# Patient Record
Sex: Female | Born: 1945 | Race: White | Hispanic: No | Marital: Married | Smoking: Never smoker
Health system: Southern US, Community
[De-identification: ages and names within clinical notes are randomized; demographics above are authoritative.]

## PROBLEM LIST (undated history)

## (undated) DIAGNOSIS — F329 Major depressive disorder, single episode, unspecified: Secondary | ICD-10-CM

## (undated) DIAGNOSIS — R339 Retention of urine, unspecified: Secondary | ICD-10-CM

## (undated) DIAGNOSIS — S065XAA Traumatic subdural hemorrhage with loss of consciousness status unknown, initial encounter: Secondary | ICD-10-CM

## (undated) DIAGNOSIS — E669 Obesity, unspecified: Secondary | ICD-10-CM

## (undated) DIAGNOSIS — E041 Nontoxic single thyroid nodule: Secondary | ICD-10-CM

## (undated) DIAGNOSIS — K449 Diaphragmatic hernia without obstruction or gangrene: Secondary | ICD-10-CM

## (undated) DIAGNOSIS — I5042 Chronic combined systolic (congestive) and diastolic (congestive) heart failure: Secondary | ICD-10-CM

## (undated) DIAGNOSIS — S065X9A Traumatic subdural hemorrhage with loss of consciousness of unspecified duration, initial encounter: Secondary | ICD-10-CM

## (undated) DIAGNOSIS — K222 Esophageal obstruction: Secondary | ICD-10-CM

## (undated) DIAGNOSIS — B9689 Other specified bacterial agents as the cause of diseases classified elsewhere: Secondary | ICD-10-CM

## (undated) DIAGNOSIS — I700 Atherosclerosis of aorta: Secondary | ICD-10-CM

## (undated) DIAGNOSIS — J189 Pneumonia, unspecified organism: Secondary | ICD-10-CM

## (undated) DIAGNOSIS — IMO0001 Reserved for inherently not codable concepts without codable children: Secondary | ICD-10-CM

## (undated) DIAGNOSIS — E7800 Pure hypercholesterolemia, unspecified: Secondary | ICD-10-CM

## (undated) DIAGNOSIS — F32A Depression, unspecified: Secondary | ICD-10-CM

## (undated) DIAGNOSIS — M1990 Unspecified osteoarthritis, unspecified site: Secondary | ICD-10-CM

## (undated) DIAGNOSIS — K219 Gastro-esophageal reflux disease without esophagitis: Secondary | ICD-10-CM

## (undated) DIAGNOSIS — I2510 Atherosclerotic heart disease of native coronary artery without angina pectoris: Secondary | ICD-10-CM

## (undated) DIAGNOSIS — W19XXXA Unspecified fall, initial encounter: Secondary | ICD-10-CM

## (undated) DIAGNOSIS — I10 Essential (primary) hypertension: Secondary | ICD-10-CM

## (undated) DIAGNOSIS — N390 Urinary tract infection, site not specified: Secondary | ICD-10-CM

## (undated) DIAGNOSIS — N289 Disorder of kidney and ureter, unspecified: Secondary | ICD-10-CM

## (undated) HISTORY — PX: CHOLECYSTECTOMY: SHX55

---

## 2000-03-12 ENCOUNTER — Ambulatory Visit (HOSPITAL_BASED_OUTPATIENT_CLINIC_OR_DEPARTMENT_OTHER): Admission: RE | Admit: 2000-03-12 | Discharge: 2000-03-12 | Payer: Self-pay | Admitting: Orthopedic Surgery

## 2002-03-08 ENCOUNTER — Emergency Department (HOSPITAL_COMMUNITY): Admission: EM | Admit: 2002-03-08 | Discharge: 2002-03-08 | Payer: Self-pay | Admitting: Emergency Medicine

## 2002-03-08 ENCOUNTER — Encounter: Payer: Self-pay | Admitting: Emergency Medicine

## 2008-03-06 ENCOUNTER — Inpatient Hospital Stay (HOSPITAL_COMMUNITY): Admission: AD | Admit: 2008-03-06 | Discharge: 2008-03-10 | Payer: Self-pay | Admitting: Internal Medicine

## 2008-03-09 ENCOUNTER — Ambulatory Visit: Payer: Self-pay | Admitting: Oncology

## 2009-09-15 ENCOUNTER — Emergency Department (HOSPITAL_COMMUNITY): Admission: EM | Admit: 2009-09-15 | Discharge: 2009-09-15 | Payer: Self-pay | Admitting: Emergency Medicine

## 2009-12-11 ENCOUNTER — Inpatient Hospital Stay (HOSPITAL_COMMUNITY)
Admission: EM | Admit: 2009-12-11 | Discharge: 2009-12-15 | Payer: Self-pay | Source: Home / Self Care | Admitting: Emergency Medicine

## 2009-12-12 LAB — CONVERTED CEMR LAB
Basophils Absolute: 0 10*3/uL
Basophils Relative: 1 %
Eosinophils Absolute: 0.3 10*3/uL
Eosinophils Relative: 5 %
Hgb A1c MFr Bld: 7.4 %
Lymphocytes Relative: 40 %
MCV: 92.9 fL
Monocytes Absolute: 0.4 10*3/uL
Neutro Abs: 2.9 10*3/uL
Platelets: 171 10*3/uL
RBC: 3.21 M/uL

## 2009-12-13 LAB — CONVERTED CEMR LAB
CO2: 23 meq/L
Calcium: 8.8 mg/dL
Chloride: 111 meq/L
Sodium: 140 meq/L
Total Bilirubin: 0.5 mg/dL
Total Protein: 2.6 g/dL

## 2009-12-26 ENCOUNTER — Ambulatory Visit: Payer: Self-pay | Admitting: Nurse Practitioner

## 2009-12-26 DIAGNOSIS — N390 Urinary tract infection, site not specified: Secondary | ICD-10-CM | POA: Insufficient documentation

## 2009-12-26 DIAGNOSIS — K449 Diaphragmatic hernia without obstruction or gangrene: Secondary | ICD-10-CM | POA: Insufficient documentation

## 2009-12-26 DIAGNOSIS — I10 Essential (primary) hypertension: Secondary | ICD-10-CM | POA: Insufficient documentation

## 2009-12-26 DIAGNOSIS — Z9889 Other specified postprocedural states: Secondary | ICD-10-CM | POA: Insufficient documentation

## 2009-12-26 DIAGNOSIS — F329 Major depressive disorder, single episode, unspecified: Secondary | ICD-10-CM

## 2009-12-26 DIAGNOSIS — K219 Gastro-esophageal reflux disease without esophagitis: Secondary | ICD-10-CM

## 2009-12-26 DIAGNOSIS — M129 Arthropathy, unspecified: Secondary | ICD-10-CM | POA: Insufficient documentation

## 2009-12-26 DIAGNOSIS — E119 Type 2 diabetes mellitus without complications: Secondary | ICD-10-CM

## 2009-12-26 DIAGNOSIS — D649 Anemia, unspecified: Secondary | ICD-10-CM | POA: Insufficient documentation

## 2009-12-26 DIAGNOSIS — N181 Chronic kidney disease, stage 1: Secondary | ICD-10-CM | POA: Insufficient documentation

## 2009-12-26 DIAGNOSIS — E785 Hyperlipidemia, unspecified: Secondary | ICD-10-CM | POA: Insufficient documentation

## 2009-12-26 LAB — CONVERTED CEMR LAB
Blood in Urine, dipstick: NEGATIVE
Cholesterol, target level: 200 mg/dL
HDL goal, serum: 40 mg/dL
Ketones, urine, test strip: NEGATIVE
LDL Goal: 100 mg/dL
Urobilinogen, UA: 1

## 2009-12-27 ENCOUNTER — Encounter (INDEPENDENT_AMBULATORY_CARE_PROVIDER_SITE_OTHER): Payer: Self-pay | Admitting: Nurse Practitioner

## 2009-12-31 ENCOUNTER — Encounter (INDEPENDENT_AMBULATORY_CARE_PROVIDER_SITE_OTHER): Payer: Self-pay | Admitting: Nurse Practitioner

## 2010-01-16 ENCOUNTER — Encounter (INDEPENDENT_AMBULATORY_CARE_PROVIDER_SITE_OTHER): Payer: Self-pay | Admitting: Nurse Practitioner

## 2010-01-28 ENCOUNTER — Ambulatory Visit: Payer: Self-pay | Admitting: Nurse Practitioner

## 2010-01-28 LAB — CONVERTED CEMR LAB
Bilirubin Urine: NEGATIVE
Glucose, Urine, Semiquant: NEGATIVE
Ketones, urine, test strip: NEGATIVE
Nitrite: NEGATIVE
Urobilinogen, UA: 0.2

## 2010-01-29 LAB — CONVERTED CEMR LAB
ALT: <8 units/L (ref 0–35)
AST: 14 units/L (ref 0–37)
Albumin: 4.2 g/dL (ref 3.5–5.2)
BUN: 22 mg/dL (ref 6–23)
Chloride: 100 meq/L (ref 96–112)
Creatinine, Ser: 0.97 mg/dL (ref 0.40–1.20)
Glucose, Bld: 150 mg/dL — ABNORMAL HIGH (ref 70–99)
Total Bilirubin: 0.6 mg/dL (ref 0.3–1.2)
Total Protein: 7.5 g/dL (ref 6.0–8.3)

## 2010-03-11 ENCOUNTER — Ambulatory Visit: Payer: Self-pay | Admitting: Nurse Practitioner

## 2010-03-11 DIAGNOSIS — E669 Obesity, unspecified: Secondary | ICD-10-CM

## 2010-03-11 LAB — CONVERTED CEMR LAB
Bilirubin Urine: NEGATIVE
Blood Glucose, Fingerstick: 181
Ketones, urine, test strip: NEGATIVE
Specific Gravity, Urine: 1.01
pH: 6.5

## 2010-03-12 ENCOUNTER — Encounter (INDEPENDENT_AMBULATORY_CARE_PROVIDER_SITE_OTHER): Payer: Self-pay | Admitting: Nurse Practitioner

## 2010-03-25 ENCOUNTER — Ambulatory Visit: Payer: Self-pay | Admitting: Internal Medicine

## 2010-03-25 ENCOUNTER — Encounter (INDEPENDENT_AMBULATORY_CARE_PROVIDER_SITE_OTHER): Payer: Self-pay | Admitting: Nurse Practitioner

## 2010-03-25 LAB — CONVERTED CEMR LAB
Albumin: 4.1 g/dL (ref 3.5–5.2)
Alkaline Phosphatase: 88 units/L (ref 39–117)
BUN: 13 mg/dL (ref 6–23)
Calcium: 10.1 mg/dL (ref 8.4–10.5)
Chloride: 103 meq/L (ref 96–112)
HDL: 39 mg/dL — ABNORMAL LOW (ref >39)
Potassium: 4.8 meq/L (ref 3.5–5.3)
Total Bilirubin: 0.6 mg/dL (ref 0.3–1.2)
Total CHOL/HDL Ratio: 4.8
Triglycerides: 159 mg/dL — ABNORMAL HIGH (ref <150)

## 2010-03-26 ENCOUNTER — Ambulatory Visit: Payer: Self-pay | Admitting: Nurse Practitioner

## 2010-04-23 ENCOUNTER — Encounter (INDEPENDENT_AMBULATORY_CARE_PROVIDER_SITE_OTHER): Payer: Self-pay | Admitting: Nurse Practitioner

## 2010-05-14 ENCOUNTER — Ambulatory Visit: Payer: Self-pay | Admitting: Nurse Practitioner

## 2010-05-14 ENCOUNTER — Encounter (INDEPENDENT_AMBULATORY_CARE_PROVIDER_SITE_OTHER): Payer: Self-pay | Admitting: Nurse Practitioner

## 2010-05-14 ENCOUNTER — Other Ambulatory Visit
Admission: RE | Admit: 2010-05-14 | Discharge: 2010-05-14 | Payer: Self-pay | Source: Home / Self Care | Admitting: Nurse Practitioner

## 2010-05-14 LAB — CONVERTED CEMR LAB
Blood Glucose, Fingerstick: 202
Glucose, Urine, Semiquant: NEGATIVE
Nitrite: NEGATIVE
Protein, U semiquant: NEGATIVE
Specific Gravity, Urine: 1.015
Urobilinogen, UA: 0.2

## 2010-05-16 ENCOUNTER — Encounter (INDEPENDENT_AMBULATORY_CARE_PROVIDER_SITE_OTHER): Payer: Self-pay | Admitting: Nurse Practitioner

## 2010-05-16 LAB — CONVERTED CEMR LAB: Microalb, Ur: 1.77 mg/dL (ref 0.00–1.89)

## 2010-05-22 ENCOUNTER — Encounter (INDEPENDENT_AMBULATORY_CARE_PROVIDER_SITE_OTHER): Payer: Self-pay | Admitting: Nurse Practitioner

## 2010-05-22 ENCOUNTER — Ambulatory Visit (HOSPITAL_COMMUNITY)
Admission: RE | Admit: 2010-05-22 | Discharge: 2010-05-22 | Payer: Self-pay | Source: Home / Self Care | Attending: Internal Medicine | Admitting: Internal Medicine

## 2010-06-17 NOTE — Assessment & Plan Note (Signed)
Summary: NEW - Hospital F/U   Vital Signs:  Patient profile:   65 year old female Menstrual status:  hysterectomy Height:      62 inches Weight:      204 pounds BMI:     37.45 Temp:     97.9 degrees F Pulse rate:   76 / minute Pulse rhythm:   regular Resp:     18 per minute BP sitting:   130 / 72  (left arm) Cuff size:   large  Vitals Entered By: Vesta Mixer CMA (December 26, 2009 2:12 PM)  Nutrition Counseling: Patient's BMI is greater than 25 and therefore counseled on weight management options. CC: One time hosp f/u for abd pain and dehydration.  Plans to transfer to [facility] Hypertension Management, Lipid Management, Abdominal Pain, Depression Is Patient Diabetic? Yes  Does patient need assistance? Ambulation Normal     Menstrual Status hysterectomy   CC:  One time hosp f/u for abd pain and dehydration.  Plans to transfer to [facility]., Hypertension Management, Lipid Management, Abdominal Pain, and Depression.  History of Present Illness:  Pt into into the office to establish care. Pt was previously seen in [place].  She was last seen there about 1 month ago. Dr. Creta Levin. She was noncomplaint at that time and pt admits that she was not able to afford the medications.  Hospitalized from 12/12/2009 to 12/15/2009 Admitted with complaints of frequent falls and nausea  Pt presents today with all her medications. She has been taking as ordered since her hospital stay.  Diabetes Management History:      The patient is a 65 years old female who comes in for evaluation of DM Type 2.  She has not been enrolled in the "Diabetic Education Program".  She states understanding of dietary principles and is following her diet appropriately.  No sensory loss is reported.  Self foot exams are not being performed.  She is checking home blood sugars.  She says that she is not exercising regularly.        No hyperglycemic symptoms are reported.  Other comments include: pt has been  taking medications since her hospital f/u.        No changes have been made to her treatment plan since last visit.    Dyspepsia History:      She has no alarm features of dyspepsia including no history of melena, hematochezia, dysphagia, persistent vomiting, or involuntary weight loss > 5%.  There is a prior history of GERD.  The patient does not have a prior history of documented ulcer disease.  The dominant symptom is heartburn or acid reflux.  An H-2 blocker medication is not currently being taken.  She has no history of a positive H. Pylori serology.  No previous upper endoscopy has been done.    Hypertension History:      She denies headache, chest pain, and palpitations.  She notes no problems with any antihypertensive medication side effects.        Positive major cardiovascular risk factors include female age 72 years old or older, diabetes, hyperlipidemia, and hypertension.  Negative major cardiovascular risk factors include non-tobacco-user status.        Further assessment for target organ damage reveals no history of ASHD, cardiac end-organ damage (CHF/LVH), stroke/TIA, peripheral vascular disease, or hypertensive retinopathy.    Lipid Management History:      Positive NCEP/ATP III risk factors include female age 44 years old or older, diabetes, and hypertension.  Negative NCEP/ATP III risk factors include no history of early menopause without estrogen hormone replacement, non-tobacco-user status, no ASHD (atherosclerotic heart disease), no prior stroke/TIA, no peripheral vascular disease, and no history of aortic aneurysm.        The patient states that she does not know about the "Therapeutic Lifestyle Change" diet.  The patient does not know about adjunctive measures for cholesterol lowering.  She expresses no side effects from her lipid-lowering medication.  The patient denies any symptoms to suggest myopathy or liver disease.    Diabetic Foot Exam Foot Inspection Is there a history  of a foot ulcer?              No Is there a foot ulcer now?              No Can the patient see the bottom of their feet?          No Are the shoes appropriate in style and fit?          Yes Is there swelling or an abnormal foot shape?          No Are the toenails long?                No Are the toenails thick?                No Are the toenails ingrown?              No Is there heavy callous build-up?              No Is there pain in the calf muscle (Intermittent claudication) when walking?    NoIs there a claw toe deformity?              No Is there elevated skin temperature?            No Is there limited ankle dorsiflexion?            No Is there foot or ankle muscle weakness?            No  Diabetic Foot Care Education Pulse Check          Right Foot          Left Foot Dorsalis Pedis:        normal            normal       Habits & Providers  Alcohol-Tobacco-Diet     Alcohol drinks/day: 0     Tobacco Status: never  Exercise-Depression-Behavior     Does Patient Exercise: no     Drug Use: never  Current Medications (verified): 1)  Citalopram Hydrobromide 40 Mg Tabs (Citalopram Hydrobromide) .Marland Kitchen.. 1 By Mouth Once Daily 2)  Sucralfate 1 Gm Tabs (Sucralfate) .Marland Kitchen.. 1 By Mouth Three Times A Day Before Meals 3)  Diltiazem Hcl 90 Mg Tabs (Diltiazem Hcl) .Marland Kitchen.. 1 By Mouth Once Daily 4)  Glipizide 5 Mg Tabs (Glipizide) .Marland Kitchen.. 1 By Mouth Two Times A Day 5)  Pantoprazole Sodium 40 Mg Tbec (Pantoprazole Sodium) .Marland Kitchen.. 1 By Mouth Once Daily 6)  Carvedilol 12.5 Mg Tabs (Carvedilol) .Marland Kitchen.. 1 By Mouth Two Times A Day 7)  Lovastatin 20 Mg Tabs (Lovastatin) .Marland Kitchen.. 1 By Mouth Once Daily 8)  Lisinopril 20 Mg Tabs (Lisinopril) .Marland Kitchen.. 1 By Mouth Once Daily  Allergies (verified): 1)  ! Norvasc  Past History:  Past Surgical History: Cholecystectomy right knee arthroscopy tubal ligation  Family History: mother -  diabetes, htn (deceased) father - deceased (lung cancer), CHF, CAD Sister -  diabetes sister - diabetes  Social History: 1 natural child, 1 adopted child tobacco - none ETOH -  none Drug - noneSmoking Status:  never Drug Use:  never Does Patient Exercise:  no  Review of Systems General:  Denies fever. CV:  Denies chest pain or discomfort; some sweling in feet after hospital d/c but that has resolved. Resp:  Denies cough. GI:  Denies abdominal pain, nausea, and vomiting.  Physical Exam  General:  alert.  obese Head:  normocephalic.   Lungs:  normal breath sounds.   Heart:  normal rate and regular rhythm.   Msk:  kyphosis Extremities:  no edema Neurologic:  alert & oriented X3.   Skin:  color normal.   Psych:  Oriented X3.    Diabetes Management Exam:       Nails:          Left foot: normal          Right foot: normal   Impression & Recommendations:  Problem # 1:  DIABETES MELLITUS, TYPE II (ICD-250.00) Some diabetic teaching done with pt today she will need continued education will order glucometer and continue current meds hgba1c = 7.4 from hospital will need to check previous PCP report to see if she is up to date with immunizations Her updated medication list for this problem includes:    Glipizide 5 Mg Tabs (Glipizide) ..... One tablet by mouth two times a day for blood sugar    Lisinopril 20 Mg Tabs (Lisinopril) ..... One tablet by mouth daily for blood pressure  Orders: Capillary Blood Glucose/CBG (16109) UA Dipstick w/o Micro (manual) (60454)  Problem # 2:  HYPERTENSION, BENIGN ESSENTIAL (ICD-401.1) BP stable Her updated medication list for this problem includes:    Diltiazem Hcl 90 Mg Tabs (Diltiazem hcl) ..... One tablet by mouth daily    Carvedilol 12.5 Mg Tabs (Carvedilol) ..... One tablet by mouth two times a day    Lisinopril 20 Mg Tabs (Lisinopril) ..... One tablet by mouth daily for blood pressure  Problem # 3:  HYPERLIPIDEMIA (ICD-272.4) continue current meds Her updated medication list for this problem includes:     Lovastatin 20 Mg Tabs (Lovastatin) ..... One tablet by mouth nightly for cholesterol  Problem # 4:  GERD (ICD-530.81) symptoms stable continue current meds Her updated medication list for this problem includes:    Sucralfate 1 Gm Tabs (Sucralfate) ..... One tablet by mouth three times a day before meals    Pantoprazole Sodium 40 Mg Tbec (Pantoprazole sodium) ..... One tablet by mouth daily for blood sugar  Complete Medication List: 1)  Citalopram Hydrobromide 40 Mg Tabs (Citalopram hydrobromide) .... One tablet by mouth daily for mood 2)  Sucralfate 1 Gm Tabs (Sucralfate) .... One tablet by mouth three times a day before meals 3)  Diltiazem Hcl 90 Mg Tabs (Diltiazem hcl) .... One tablet by mouth daily 4)  Glipizide 5 Mg Tabs (Glipizide) .... One tablet by mouth two times a day for blood sugar 5)  Pantoprazole Sodium 40 Mg Tbec (Pantoprazole sodium) .... One tablet by mouth daily for blood sugar 6)  Carvedilol 12.5 Mg Tabs (Carvedilol) .... One tablet by mouth two times a day 7)  Lovastatin 20 Mg Tabs (Lovastatin) .... One tablet by mouth nightly for cholesterol 8)  Lisinopril 20 Mg Tabs (Lisinopril) .... One tablet by mouth daily for blood pressure 9)  Blood Glucose Meter Kit (Blood glucose monitoring  suppl) .... Use to check blood sugar daily 10)  Lancets Misc (Lancets) .... Use to check blood sugar daily 11)  Blood Glucose Test Strp (Glucose blood) .... Use to check blood sugar daily before breakfast  Other Orders: T-Culture, Urine ([id number])  Diabetes Management Assessment/Plan:      The following lipid goals have been established for the patient: Total cholesterol goal of 200; LDL cholesterol goal of 100; HDL cholesterol goal of 40; Triglyceride goal of 150.  Her blood pressure goal is < 130/80.    Hypertension Assessment/Plan:      The patient's hypertensive risk group is category C: Target organ damage and/or diabetes.  Today's blood pressure is 130/72.  Her blood pressure goal  is < 130/80.  Lipid Assessment/Plan:      Based on NCEP/ATP III, the patient's risk factor category is "history of diabetes".  The patient's lipid goals are as follows: Total cholesterol goal is 200; LDL cholesterol goal is 100; HDL cholesterol goal is 40; Triglyceride goal is 150.    Patient Instructions: 1)  Sign a release to get records from Dr. Creta Levin at [place]. 2)  Continue your medications as ordered 3)  Follow up in 1 month for diabetes 4)  bring all your medications to this office visit Prescriptions: BLOOD GLUCOSE TEST  STRP (GLUCOSE BLOOD) Use to check blood sugar daily before breakfast  #100 x 3   Entered and Authorized by:   Lehman Prom FNP   Signed by:   Lehman Prom FNP on 12/26/2009   Method used:   Print then Give to Patient   RxID:   [id number] LANCETS  MISC (LANCETS) Use to check blood sugar daily  #100 x 3   Entered and Authorized by:   Lehman Prom FNP   Signed by:   Lehman Prom FNP on 12/26/2009   Method used:   Print then Give to Patient   RxID:   [id number] BLOOD GLUCOSE METER  KIT (BLOOD GLUCOSE MONITORING SUPPL) Use to check blood sugar daily  #1 meter x 0   Entered and Authorized by:   Lehman Prom FNP   Signed by:   Lehman Prom FNP on 12/26/2009   Method used:   Print then Give to Patient   RxID:   [id number] LISINOPRIL 20 MG TABS (LISINOPRIL) One tablet by mouth daily for blood pressure  #30 x 3   Entered and Authorized by:   Lehman Prom FNP   Signed by:   Lehman Prom FNP on 12/26/2009   Method used:   Print then Give to Patient   RxID:   [id number] LOVASTATIN 20 MG TABS (LOVASTATIN) One tablet by mouth nightly for cholesterol  #30 x 3   Entered and Authorized by:   Lehman Prom FNP   Signed by:   Lehman Prom FNP on 12/26/2009   Method used:   Print then Give to Patient   RxID:   [id number] CARVEDILOL 12.5 MG TABS (CARVEDILOL) One tablet by mouth two times a day  #60 x 3    Entered and Authorized by:   Lehman Prom FNP   Signed by:   Lehman Prom FNP on 12/26/2009   Method used:   Print then Give to Patient   RxID:   [id number] PANTOPRAZOLE SODIUM 40 MG TBEC (PANTOPRAZOLE SODIUM) One tablet by mouth daily for blood sugar  #30 x 3   Entered and Authorized by:   Lehman Prom FNP   Signed by:   Lehman Prom FNP on 12/26/2009  Method used:   Print then Give to Patient   RxID:   [id number] GLIPIZIDE 5 MG TABS (GLIPIZIDE) One tablet by mouth two times a day for blood sugar  #60 x 3   Entered and Authorized by:   Lehman Prom FNP   Signed by:   Lehman Prom FNP on 12/26/2009   Method used:   Print then Give to Patient   RxID:   [id number] DILTIAZEM HCL 90 MG TABS (DILTIAZEM HCL) One tablet by mouth daily  #30 x 3   Entered and Authorized by:   Lehman Prom FNP   Signed by:   Lehman Prom FNP on 12/26/2009   Method used:   Print then Give to Patient   RxID:   [id number] SUCRALFATE 1 GM TABS (SUCRALFATE) One tablet by mouth three times a day before meals  #90 x 3   Entered and Authorized by:   Lehman Prom FNP   Signed by:   Lehman Prom FNP on 12/26/2009   Method used:   Print then Give to Patient   RxID:   [id number] CITALOPRAM HYDROBROMIDE 40 MG TABS (CITALOPRAM HYDROBROMIDE) One tablet by mouth daily for mood  #30 x 3   Entered and Authorized by:   Lehman Prom FNP   Signed by:   Lehman Prom FNP on 12/26/2009   Method used:   Print then Give to Patient   RxID:   [id number]   Laboratory Results   Urine Tests  Date/Time Received: December 26, 2009 5:10 PM   Routine Urinalysis   Color: lt. yellow Appearance: Clear Glucose: negative   (Normal Range: Negative) Bilirubin: negative   (Normal Range: Negative) Ketone: negative   (Normal Range: Negative) Spec. Gravity: 1.015   (Normal Range: 1.003-1.035) Blood: negative   (Normal Range: Negative) pH: 7.5   (Normal Range:  5.0-8.0) Protein: trace   (Normal Range: Negative) Urobilinogen: 1.0   (Normal Range: 0-1) Nitrite: negative   (Normal Range: Negative) Leukocyte Esterace: small   (Normal Range: Negative)         CXR  Procedure date:  12/12/2009  Findings:      no active disease  CT Scan  Procedure date:  12/12/2009  Findings:      head - chronic microbvascular ischemia. no acute intracranial abnormality.  there is mucosal thickening of the left maxillary sinus. There is also thickening in the right frontal sinus and the mucosal thickening of the right ethmoid sinus  CT Abdomen/Pelvis  Procedure date:  12/12/2009  Findings:      diffuse scattered sigmoid diverticula.  no acute diverticulitis seen.  There is small hiatal hernia on prior cholecystectomy.   CXR  Procedure date:  12/12/2009  Findings:      no active disease  CT Scan  Procedure date:  12/12/2009  Findings:      head - chronic microbvascular ischemia. no acute intracranial abnormality.  there is mucosal thickening of the left maxillary sinus. There is also thickening in the right frontal sinus and the mucosal thickening of the right ethmoid sinus  CT Abdomen/Pelvis  Procedure date:  12/12/2009  Findings:      diffuse scattered sigmoid diverticula.  no acute diverticulitis seen.  There is small hiatal hernia on prior cholecystectomy.  Laboratory Results   Urine Tests    Routine Urinalysis   Color: lt. yellow Appearance: Clear Glucose: negative   (Normal Range: Negative) Bilirubin: negative   (Normal Range: Negative) Ketone: negative   (Normal Range: Negative) Spec. Gravity: 1.015   (  Normal Range: 1.003-1.035) Blood: negative   (Normal Range: Negative) pH: 7.5   (Normal Range: 5.0-8.0) Protein: trace   (Normal Range: Negative) Urobilinogen: 1.0   (Normal Range: 0-1) Nitrite: negative   (Normal Range: Negative) Leukocyte Esterace: small   (Normal Range: Negative)

## 2010-06-17 NOTE — Letter (Signed)
Summary: Lipid Letter  [facility]  [address] [place], [place] [address]   Phone: [phone]  Fax: [phone]    03/26/2010  Rebacca [place] [address] [place], [place]  [address]  Dear Ms. Sforza:  We have carefully reviewed your last lipid profile from 03/25/2010 and the results are noted below with a summary of recommendations for lipid management.    Cholesterol:       189     Goal: less than 200   HDL "good" Cholesterol:   39     Goal: greater than 40   LDL "bad" Cholesterol:   118     Goal: less than 130   Triglycerides:       159     Goal: less than 150    Labs done during recent office visit are ok.  Except Triglycerides are slightly elevated at 159.    Current Medications: 1)    Citalopram Hydrobromide 40 Mg Tabs (Citalopram hydrobromide) .... One tablet by mouth daily for mood 2)    Sucralfate 1 Gm Tabs (Sucralfate) .... One tablet by mouth three times a day before meals 3)    Diltiazem Hcl 90 Mg Tabs (Diltiazem hcl) .... One tablet by mouth daily 4)    Glipizide 5 Mg Tabs (Glipizide) .... One tablet by mouth two times a day for blood sugar 5)    Pantoprazole Sodium 40 Mg Tbec (Pantoprazole sodium) .... One tablet by mouth daily for blood sugar 6)    Carvedilol 12.5 Mg Tabs (Carvedilol) .... One tablet by mouth two times a day 7)    Pravastatin Sodium 20 Mg Tabs (Pravastatin sodium) .... One tablet by mouth nightly for cholesterol 8)    Lisinopril 40 Mg Tabs (Lisinopril) .... One tablet by mouth daily for blood pressure **note change in dose** 9)    Blood Glucose Meter  Kit (Blood glucose monitoring suppl) .... Use to check blood sugar daily 10)    Lancets  Misc (Lancets) .... Use to check blood sugar daily 11)    Blood Glucose Test  Strp (Glucose blood) .... Use to check blood sugar daily before breakfast 12)    Tramadol Hcl 50 Mg Tabs (Tramadol hcl) .... One tablet by mouth two times a day as needed for pain  If you have any  questions, please call. We appreciate being able to work with you.   Sincerely,    [facility]

## 2010-06-17 NOTE — Letter (Signed)
Summary: PT INFORMATION SHEET  PT INFORMATION SHEET   Imported By: Arta Bruce 12/30/2009 16:32:59  _____________________________________________________________________  External Attachment:    Type:   Image     Comment:   External Document

## 2010-06-17 NOTE — Assessment & Plan Note (Signed)
Summary: Diabetes   Vital Signs:  Patient profile:   65 year old female Menstrual status:  hysterectomy Weight:      210.7 pounds BMI:     38.68 Temp:     97.9 degrees F oral Pulse rate:   80 / minute Pulse rhythm:   regular Resp:     20 per minute BP sitting:   154 / 90  (left arm) Cuff size:   large  Vitals Entered By: Levon Hedger (January 28, 2010 11:29 AM)  Nutrition Counseling: Patient's BMI is greater than 25 and therefore counseled on weight management options. CC: 1 month f/u DM, Hypertension Management, Lipid Management, Abdominal Pain Is Patient Diabetic? Yes Pain Assessment Patient in pain? no       Does patient need assistance? Functional Status Self care Ambulation Normal Comments pt did not bring medication today.   CC:  1 month f/u DM, Hypertension Management, Lipid Management, and Abdominal Pain.  History of Present Illness:  Pt into the office for f/u on diabetes. She was seen on initial visit during last month.  no medications today during visit - advised pt to bring all medications with her  Diabetes Management History:      The patient is a 65 years old female who comes in for evaluation of DM Type 2.  She has not been enrolled in the "Diabetic Education Program".  She states understanding of dietary principles and is following her diet appropriately.  No sensory loss is reported.  Self foot exams are not being performed.  She is checking home blood sugars.  She says that she is not exercising regularly.        Hypoglycemic symptoms are not occurring.  No hyperglycemic symptoms are reported.        No changes have been made to her treatment plan since last visit.    Dyspepsia History:      She has no alarm features of dyspepsia including no history of melena, hematochezia, dysphagia, persistent vomiting, or involuntary weight loss > 5%.  There is a prior history of GERD.  The patient does not have a prior history of documented ulcer disease.   The dominant symptom is heartburn or acid reflux.  An H-2 blocker medication is not currently being taken.  No previous upper endoscopy has been done.    Hypertension History:      She denies headache, chest pain, and palpitations.  She notes no problems with any antihypertensive medication side effects.  Pt has taken her bp meds today.        Positive major cardiovascular risk factors include female age 25 years old or older, diabetes, hyperlipidemia, and hypertension.  Negative major cardiovascular risk factors include non-tobacco-user status.        Further assessment for target organ damage reveals no history of ASHD, cardiac end-organ damage (CHF/LVH), stroke/TIA, peripheral vascular disease, or hypertensive retinopathy.    Lipid Management History:      Positive NCEP/ATP III risk factors include female age 27 years old or older, diabetes, and hypertension.  Negative NCEP/ATP III risk factors include no history of early menopause without estrogen hormone replacement, non-tobacco-user status, no ASHD (atherosclerotic heart disease), no prior stroke/TIA, no peripheral vascular disease, and no history of aortic aneurysm.        The patient states that she does not know about the "Therapeutic Lifestyle Change" diet.  The patient expresses understanding of adjunctive measures for cholesterol lowering.  Adjunctive measures started by the patient include  weight reduction.       Allergies (verified): 1)  ! Norvasc  Review of Systems CV:  Denies chest pain or discomfort. Resp:  Denies cough. GI:  Complains of indigestion.  Physical Exam  General:  alert.  obese Head:  normocephalic.   Lungs:  normal breath sounds.   Heart:  normal rate and regular rhythm.   Abdomen:  normal bowel sounds.   Msk:  up to the exam table Neurologic:  alert & oriented X3.   Skin:  color normal.   Psych:  Oriented X3.    Diabetes Management Exam:    Foot Exam (with socks and/or shoes not present):        Sensory-Monofilament:          Left foot: normal          Right foot: normal       Nails:          Left foot: normal          Right foot: normal   Impression & Recommendations:  Problem # 1:  DIABETES MELLITUS, TYPE II (ICD-250.00)  Her updated medication list for this problem includes:    Glipizide 5 Mg Tabs (Glipizide) ..... One tablet by mouth two times a day for blood sugar    Lisinopril 20 Mg Tabs (Lisinopril) ..... One tablet by mouth daily for blood pressure  Orders: T-Comprehensive Metabolic Panel (64403-47425)  Problem # 2:  HYPERTENSION, BENIGN ESSENTIAL (ICD-401.1) BP elevated today.  if still elevated on next visit will need to adjust meds Her updated medication list for this problem includes:    Diltiazem Hcl 90 Mg Tabs (Diltiazem hcl) ..... One tablet by mouth daily    Carvedilol 12.5 Mg Tabs (Carvedilol) ..... One tablet by mouth two times a day    Lisinopril 20 Mg Tabs (Lisinopril) ..... One tablet by mouth daily for blood pressure  Orders: T-Comprehensive Metabolic Panel ([id number])  Problem # 3:  CHRONIC KIDNEY DISEASE STAGE I (ICD-585.1) will check cbc today  Problem # 4:  HYPERLIPIDEMIA (ICD-272.4)  Her updated medication list for this problem includes:    Lovastatin 20 Mg Tabs (Lovastatin) ..... One tablet by mouth nightly for cholesterol  Orders: T-Comprehensive Metabolic Panel ([id number])  Complete Medication List: 1)  Citalopram Hydrobromide 40 Mg Tabs (Citalopram hydrobromide) .... One tablet by mouth daily for mood 2)  Sucralfate 1 Gm Tabs (Sucralfate) .... One tablet by mouth three times a day before meals 3)  Diltiazem Hcl 90 Mg Tabs (Diltiazem hcl) .... One tablet by mouth daily 4)  Glipizide 5 Mg Tabs (Glipizide) .... One tablet by mouth two times a day for blood sugar 5)  Pantoprazole Sodium 40 Mg Tbec (Pantoprazole sodium) .... One tablet by mouth daily for blood sugar 6)  Carvedilol 12.5 Mg Tabs (Carvedilol) .... One tablet by mouth  two times a day 7)  Lovastatin 20 Mg Tabs (Lovastatin) .... One tablet by mouth nightly for cholesterol 8)  Lisinopril 20 Mg Tabs (Lisinopril) .... One tablet by mouth daily for blood pressure 9)  Blood Glucose Meter Kit (Blood glucose monitoring suppl) .... Use to check blood sugar daily 10)  Lancets Misc (Lancets) .... Use to check blood sugar daily 11)  Blood Glucose Test Strp (Glucose blood) .... Use to check blood sugar daily before breakfast 12)  Tramadol Hcl 50 Mg Tabs (Tramadol hcl) .... One tablet by mouth two times a day as needed for pain  Diabetes Management Assessment/Plan:  The following lipid goals have been established for the patient: Total cholesterol goal of 200; LDL cholesterol goal of 100; HDL cholesterol goal of 40; Triglyceride goal of 150.  Her blood pressure goal is < 130/80.    Hypertension Assessment/Plan:      The patient's hypertensive risk group is category C: Target organ damage and/or diabetes.  Today's blood pressure is 154/90.  Her blood pressure goal is < 130/80.  Lipid Assessment/Plan:      Based on NCEP/ATP III, the patient's risk factor category is "history of diabetes".  The patient's lipid goals are as follows: Total cholesterol goal is 200; LDL cholesterol goal is 100; HDL cholesterol goal is 40; Triglyceride goal is 150.    Patient Instructions: 1)  Blood pressure - slightly elevated today.   2)  We will continue to monitor on your next visit. 3)  Diabetes - continue to take your medications as ordered 4)  Follow up in 6 weeks for diabetes.  You will need lipids, H.Pylori (IgM),  u/a, cbg, flu vaccine. Prescriptions: TRAMADOL HCL 50 MG TABS (TRAMADOL HCL) One tablet by mouth two times a day as needed for pain  #60 x 0   Entered and Authorized by:   Lehman Prom FNP   Signed by:   Lehman Prom FNP on 01/28/2010   Method used:   Print then Give to Patient   RxID:   [id number]   Laboratory Results   Urine Tests  Date/Time  Received: January 28, 2010 11:48 AM   Routine Urinalysis   Color: yellow Appearance: Hazy Glucose: negative   (Normal Range: Negative) Bilirubin: negative   (Normal Range: Negative) Ketone: negative   (Normal Range: Negative) Spec. Gravity: 1.010   (Normal Range: 1.003-1.035) Blood: negative   (Normal Range: Negative) pH: 8.0   (Normal Range: 5.0-8.0) Protein: negative   (Normal Range: Negative) Urobilinogen: 0.2   (Normal Range: 0-1) Nitrite: negative   (Normal Range: Negative) Leukocyte Esterace: small   (Normal Range: Negative)               Diabetic Foot Exam    10-g (5.07) Semmes-Weinstein Monofilament Test Performed by: Levon Hedger          Right Foot          Left Foot Visual Inspection               Test Control      normal         normal Site 1         normal         normal Site 2         normal         normal Site 3         normal         normal Site 4         normal         normal Site 5         normal         normal Site 6         normal         normal Site 7         normal         normal Site 8         normal         normal Site 9         normal  abnormal Site 10         normal         normal  Impression      normal         normal

## 2010-06-17 NOTE — Letter (Signed)
Summary: REQUESTING RECORDS FROM DR.STALLINGS  REQUESTING RECORDS FROM DR.STALLINGS   Imported By: Arta Bruce 01/06/2010 12:55:38  _____________________________________________________________________  External Attachment:    Type:   Image     Comment:   External Document

## 2010-06-17 NOTE — Miscellaneous (Signed)
Summary: Records from Dr. Horald Chestnut  Clinical Lists Changes Records received and placed in historical file

## 2010-06-17 NOTE — Letter (Signed)
Summary: Records received from Dr. Stallings/Historical File Chart  Records received from Dr. Stallings/Historical File Chart   Imported By: Arta Bruce 04/24/2010 17:05:53  _____________________________________________________________________  External Attachment:    Type:   Image     Comment:   External Document

## 2010-06-17 NOTE — Assessment & Plan Note (Signed)
Summary: Diabetes   Vital Signs:  Patient profile:   65 year old female Menstrual status:  hysterectomy Weight:      215.5 pounds BMI:     39.56 Temp:     97.2 degrees F oral Pulse rate:   72 / minute Pulse rhythm:   regular Resp:     20 per minute BP sitting:   160 / 80  (left arm) Cuff size:   large  Vitals Entered By: Levon Hedger (March 11, 2010 9:53 AM)  Nutrition Counseling: Patient's BMI is greater than 25 and therefore counseled on weight management options. CC: follow-up visit DM, Hypertension Management, Lipid Management, Abdominal Pain Is Patient Diabetic? Yes Pain Assessment Patient in pain? yes     Location: legs Intensity: 5 CBG Result 181 CBG Device ID B  Does patient need assistance? Functional Status Self care Ambulation Normal Comments brought a  medication list today.Micheal Likens ate yogurt and drank milk this morning   CC:  follow-up visit DM, Hypertension Management, Lipid Management, and Abdominal Pain.  History of Present Illness:  Pt into the office for 6 week diabetic f/u.    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   Her legs and back start hurting with prolonged standing. Sitting does help some with relief but that is only temporary. At night the legs ache.  She does have to take pain medication. She does use some vics vapor rub which helps some with the aches of the legs.  Obesity - up 5 pounds since last visit.   Pt reports that now she can swallow since she has been on GERD meds. She is eating better. Pt has lost weight before with portion size and exercise.  She admits that she is eating lots of 'junk"  Dyspepsia History:      There is a prior history of GERD.  The patient does not have a prior history of documented ulcer disease.  The dominant symptom is not heartburn or acid reflux.  An H-2 blocker medication is not  currently being taken.    Hypertension History:      She denies headache, chest pain, and palpitations.  She notes no problems with any antihypertensive medication side effects.  Pt has already taken her bp meds today at 8AM. She does check BP when she goes [employer] and notes that systolic is still elevated.        Positive major cardiovascular risk factors include female age 35 years old or older, diabetes, hyperlipidemia, and hypertension.  Negative major cardiovascular risk factors include non-tobacco-user status.        Further assessment for target organ damage reveals no history of ASHD, cardiac end-organ damage (CHF/LVH), stroke/TIA, peripheral vascular disease, or hypertensive retinopathy.    Lipid Management History:      Positive NCEP/ATP III risk factors include female age 70 years old or older, diabetes, and hypertension.  Negative NCEP/ATP III risk factors include no history of early menopause without estrogen hormone replacement, non-tobacco-user status, no ASHD (atherosclerotic heart disease), no prior stroke/TIA, no peripheral vascular disease, and no history of aortic aneurysm.        The patient states that she does not know about the "Therapeutic Lifestyle Change" diet.  The patient does not know about adjunctive measures for cholesterol lowering.  Adjunctive measures started by the patient include ASA.  She expresses no side  effects from her lipid-lowering medication.  Comments include: doing well on meds.  The patient denies any symptoms to suggest myopathy or liver disease.      Allergies (verified): 1)  ! Norvasc  Review of Systems CV:  Denies chest pain or discomfort. Resp:  Denies cough. GI:  Denies abdominal pain, nausea, and vomiting. MS:  Complains of joint pain and stiffness; back and legs.  Physical Exam  General:  alert.   Head:  normocephalic.   Lungs:  normal breath sounds.   Heart:  normal rate and regular rhythm.   Abdomen:  normal bowel sounds.     Neurologic:  alert & oriented X3.   Skin:  color normal.   Psych:  Oriented X3.     Impression & Recommendations:  Problem # 1:  DIABETES MELLITUS, TYPE II (ICD-250.00) will check hgba1c today continue current meds for now Her updated medication list for this problem includes:    Glipizide 5 Mg Tabs (Glipizide) ..... One tablet by mouth two times a day for blood sugar    Lisinopril 40 Mg Tabs (Lisinopril) ..... One tablet by mouth daily for blood pressure **note change in dose**  Orders: Capillary Blood Glucose/CBG (82948) T- Hemoglobin A1C (16109-60454) T-Urine Microalbumin w/creat. ratio [phone]) UA Dipstick w/o Micro (manual) (21308)  Problem # 2:  HYPERTENSION, BENIGN ESSENTIAL (ICD-401.1) will increase lisinopril to 40mg  Her updated medication list for this problem includes:    Diltiazem Hcl 90 Mg Tabs (Diltiazem hcl) ..... One tablet by mouth daily    Carvedilol 12.5 Mg Tabs (Carvedilol) ..... One tablet by mouth two times a day    Lisinopril 40 Mg Tabs (Lisinopril) ..... One tablet by mouth daily for blood pressure **note change in dose**  Problem # 3:  HYPERLIPIDEMIA (ICD-272.4) will check labs on next visit Her updated medication list for this problem includes:    Pravastatin Sodium 20 Mg Tabs (Pravastatin sodium) ..... One tablet by mouth nightly for cholesterol  Problem # 4:  GERD (ICD-530.81) stable with current meds Her updated medication list for this problem includes:    Sucralfate 1 Gm Tabs (Sucralfate) ..... One tablet by mouth three times a day before meals    Pantoprazole Sodium 40 Mg Tbec (Pantoprazole sodium) ..... One tablet by mouth daily for blood sugar  Problem # 5:  OBESITY (ICD-278.00) pt advised to monitor weight and exercise  Problem # 6:  NEED PROPHYLACTIC VACCINATION&INOCULATION FLU (ICD-V04.81) given today  Complete Medication List: 1)  Citalopram Hydrobromide 40 Mg Tabs (Citalopram hydrobromide) .... One tablet by mouth daily  for mood 2)  Sucralfate 1 Gm Tabs (Sucralfate) .... One tablet by mouth three times a day before meals 3)  Diltiazem Hcl 90 Mg Tabs (Diltiazem hcl) .... One tablet by mouth daily 4)  Glipizide 5 Mg Tabs (Glipizide) .... One tablet by mouth two times a day for blood sugar 5)  Pantoprazole Sodium 40 Mg Tbec (Pantoprazole sodium) .... One tablet by mouth daily for blood sugar 6)  Carvedilol 12.5 Mg Tabs (Carvedilol) .... One tablet by mouth two times a day 7)  Pravastatin Sodium 20 Mg Tabs (Pravastatin sodium) .... One tablet by mouth nightly for cholesterol 8)  Lisinopril 40 Mg Tabs (Lisinopril) .... One tablet by mouth daily for blood pressure **note change in dose** 9)  Blood Glucose Meter Kit (Blood glucose monitoring suppl) .... Use to check blood sugar daily 10)  Lancets Misc (Lancets) .... Use to check blood sugar daily 11)  Blood Glucose Test Strp (  Glucose blood) .... Use to check blood sugar daily before breakfast 12)  Tramadol Hcl 50 Mg Tabs (Tramadol hcl) .... One tablet by mouth two times a day as needed for pain  Other Orders: Flu Vaccine 104yrs + (16109) Admin 1st Vaccine (60454)  Hypertension Assessment/Plan:      The patient's hypertensive risk group is category C: Target organ damage and/or diabetes.  Today's blood pressure is 160/80.  Her blood pressure goal is < 130/80.  Lipid Assessment/Plan:      Based on NCEP/ATP III, the patient's risk factor category is "history of diabetes".  The patient's lipid goals are as follows: Total cholesterol goal is 200; LDL cholesterol goal is 100; HDL cholesterol goal is 40; Triglyceride goal is 150.     Patient Instructions: 1)  Schedule a lab visit in 2 weeks for fasting labs and blood pressure check.  Take blood pressure meds with water. Goal <140/90 2)  Do not eat after midnight before this visit. 3)  lipids, cmp, vitamin D, TSH, H.Pylori (IgM) 4)  You have received the flu vaccine today. 5)  Blood presure - still elevated.  6)   Increase lisinopril to 40mg  by mouth daily  7)  Take 2 of the 20mg  tablets that you have.  i will send a new prescription to the pharmacy for next month - lisinopril 40mg : ONE tablet by mouth daily. 8)  Schedule an appointment for a complete physical exam in 3 months.  Will need vaginal exam, mammogram, tdap, EKG, PHQ-9  Prescriptions: LISINOPRIL 40 MG TABS (LISINOPRIL) One tablet by mouth daily for blood pressure **note change in dose**  #30 x 2   Entered and Authorized by:   Lehman Prom FNP   Signed by:   Lehman Prom FNP on 03/11/2010   Method used:   Faxed to ...       [facility] (retail)       [address] [place], [place]  [address]       Ph: [phone]       Fax: [phone]   RxID:   [id number]    Orders Added: 1)  Capillary Blood Glucose/CBG [82948] 2)  Flu Vaccine 71yrs + [90658] 3)  Admin 1st Vaccine [90471] 4)  Est. Patient Level III [27253] 5)  T- Hemoglobin A1C [83036-23375] 6)  T-Urine Microalbumin w/creat. ratio [82043-[address]] 7)  UA Dipstick w/o Micro (manual) [81002]   Immunizations Administered:  Influenza Vaccine # 1:    Vaccine Type: Fluvax 3+    Site: right deltoid    Mfr: [email or web address]    Dose: 0.5 ml    Route: IM    Given by: Levon Hedger    Exp. Date: 11/15/2010    Lot #: [id number]    VIS given: 12/10/09 version given March 11, 2010.  Flu Vaccine Consent Questions:    Do you have a history of severe allergic reactions to this vaccine? no    Any prior history of allergic reactions to egg and/or gelatin? no    Do you have a sensitivity to the preservative Thimersol? no    Do you have a past history of Guillan-Barre Syndrome? no    Do you currently have an acute febrile illness? no    Have you ever had a severe reaction to latex? no    Vaccine information given and explained to patient? yes    Are you currently pregnant? no    ndc  [phone]  Immunizations  Administered:  Influenza Vaccine # 1:    Vaccine Type: Fluvax 3+    Site: right deltoid    Mfr: [email or web address]    Dose: 0.5 ml    Route: IM    Given by: Levon Hedger    Exp. Date: 11/15/2010    Lot #: [id number]    VIS given: 12/10/09 version given March 11, 2010.   Laboratory Results   Urine Tests  Date/Time Received: March 11, 2010 10:52 AM   Routine Urinalysis   Color: red Glucose: negative   (Normal Range: Negative) Bilirubin: negative   (Normal Range: Negative) Ketone: negative   (Normal Range: Negative) Spec. Gravity: 1.010   (Normal Range: 1.003-1.035) Blood: negative   (Normal Range: Negative) pH: 6.5   (Normal Range: 5.0-8.0) Protein: negative   (Normal Range: Negative) Urobilinogen: 0.2   (Normal Range: 0-1) Nitrite: negative   (Normal Range: Negative) Leukocyte Esterace: negative   (Normal Range: Negative)     Blood Tests     CBG Random:: 181mg /dL

## 2010-06-17 NOTE — Assessment & Plan Note (Signed)
Summary: follow-up BP recheck  Nurse Visit   Vital Signs:  Patient profile:   65 year old female Menstrual status:  hysterectomy Pulse rate:   72 / minute Pulse rhythm:   regular Resp:     20 per minute BP sitting:   144 / 76  (right arm) Cuff size:   large  Vitals Entered By: Dutch Quint RN (March 26, 2010 11:08 AM)  Impression & Recommendations:  Problem # 1:  HYPERTENSION, BENIGN ESSENTIAL (ICD-401.1) Blood pressure much better No change in meds right now. Needs CPP in three months  Her updated medication list for this problem includes:    Diltiazem Hcl 90 Mg Tabs (Diltiazem hcl) ..... One tablet by mouth daily    Carvedilol 12.5 Mg Tabs (Carvedilol) ..... One tablet by mouth two times a day    Lisinopril 40 Mg Tabs (Lisinopril) ..... One tablet by mouth daily for blood pressure **note change in dose**  Complete Medication List: 1)  Citalopram Hydrobromide 40 Mg Tabs (Citalopram hydrobromide) .... One tablet by mouth daily for mood 2)  Sucralfate 1 Gm Tabs (Sucralfate) .... One tablet by mouth three times a day before meals 3)  Diltiazem Hcl 90 Mg Tabs (Diltiazem hcl) .... One tablet by mouth daily 4)  Glipizide 5 Mg Tabs (Glipizide) .... One tablet by mouth two times a day for blood sugar 5)  Pantoprazole Sodium 40 Mg Tbec (Pantoprazole sodium) .... One tablet by mouth daily for blood sugar 6)  Carvedilol 12.5 Mg Tabs (Carvedilol) .... One tablet by mouth two times a day 7)  Pravastatin Sodium 20 Mg Tabs (Pravastatin sodium) .... One tablet by mouth nightly for cholesterol 8)  Lisinopril 40 Mg Tabs (Lisinopril) .... One tablet by mouth daily for blood pressure **note change in dose** 9)  Blood Glucose Meter Kit (Blood glucose monitoring suppl) .... Use to check blood sugar daily 10)  Lancets Misc (Lancets) .... Use to check blood sugar daily 11)  Blood Glucose Test Strp (Glucose blood) .... Use to check blood sugar daily before breakfast 12)  Tramadol Hcl 50 Mg Tabs  (Tramadol hcl) .... One tablet by mouth two times a day as needed for pain    CC:  BP recheck.  History of Present Illness: Was in office yesterday, BP 220/100.  Received clonidine 0.1 x 1 dose, BP came down to 190/90.  Here for BP recheck.  Took all meds this morning before 8 am.  States she has a dry, hacking cough.  Is taking cold medicine and c/o sinus pressure.  Denies headache, visual changes.   Patient Instructions: 1)  Reviewed with Jesse Fall 2)  Blood pressure is much better today. 3)  No changes in blood pressure medications for now. 4)  Take only [place] HBP for cough because of your blood pressure. 5)  Make appointment for follow-up for your blood pressure and complete physical exam, including pap, in 3 months. 6)  Call if anything changes or if you have any questions.   Physical Exam  Lungs:  normal respiratory effort, normal breath sounds, no crackles, and no wheezes.   Heart:  normal rate and regular rhythm.     Review of Systems CV:  Complains of leg cramps with exertion, shortness of breath with exertion, and swelling of hands; Hands are swollen in the mornings when she gets up, then edema goes away..  CC: BP recheck Is Patient Diabetic? Yes Did you bring your meter with you today? No Pain Assessment Patient in  pain? no       Does patient need assistance? Functional Status Self care Ambulation Normal   Allergies: 1)  ! Norvasc  Orders Added: 1)  Est. Patient Level I [16109] 2)  Est. Patient Level I [60454]

## 2010-06-17 NOTE — Letter (Signed)
Summary: *HSN Results Follow up  [facility]  [address] [place], [place] [address]   Phone: [phone]  Fax: [phone]      03/12/2010   Adriahna G Janik [address] [place], [place]  [address]   Dear  Ms. Patrena Toback,                            ____S.Drinkard,FNP   ____D. Gore,FNP       ____B. McPherson,MD   ____V. Rankins,MD    ____E. Mulberry,MD    _X___N. Daphine Deutscher, FNP  ____D. Reche Dixon, MD    ____K. Philipp Deputy, MD    ____Other     This letter is to inform you that your recent test(s):  _______Pap Smear    ___X____Lab Test     _______X-ray    ___X____ is within acceptable limits  _______ requires a medication change  _______ requires a follow-up lab visit  _______ requires a follow-up visit with your provider   Comments: Your Hgba1c = 7.1 The goal is less than 7. Keep up your efforts to change your diet and lose weight.   Continue current medications.       _________________________________________________________ If you have any questions, please contact our office [phone].                    Sincerely,    Lehman Prom FNP [facility]

## 2010-06-19 NOTE — Letter (Signed)
Summary: *HSN Results Follow up  [facility]  [address] [place], [place] [address]   Phone: [phone]  Fax: [phone]      05/22/2010   Chloe Harding [address] [place], [place]  [address]   Dear  [name],                            ____S.Drinkard,FNP   ____D. Gore,FNP       ____B. McPherson,MD   ____V. Rankins,MD    ____E. Mulberry,MD    __X__N. Daphine Deutscher, FNP  ____D. Reche Dixon, MD    ____K. Philipp Deputy, MD    ____Other     This letter is to inform you that your recent test(s):  ___X____Pap Smear    _______Lab Test     _______X-ray   Comments: Pap Smear results are normal.       _________________________________________________________ If you have any questions, please contact our office [phone].                    Sincerely,    Lehman Prom FNP [facility]

## 2010-06-19 NOTE — Progress Notes (Signed)
Summary: Office Visit//DEPRESSION SCREENING  Office Visit//DEPRESSION SCREENING   Imported By: Arta Bruce 05/14/2010 15:06:08  _____________________________________________________________________  External Attachment:    Type:   Image     Comment:   External Document

## 2010-06-19 NOTE — Assessment & Plan Note (Signed)
Summary: Complete Physical Exam   Vital Signs:  Patient profile:   65 year old female Menstrual status:  postmenopausal Height:      62 inches Weight:      218 pounds BMI:     40.02 Temp:     97.1 degrees F oral Pulse rhythm:   regular Resp:     18 per minute BP sitting:   140 / 90  (left arm) Cuff size:   large  Vitals Entered By: Arm[place] Shannon (May 14, 2010 2:15 PM)  Nutrition Counseling: Patient's BMI is greater than 25 and therefore counseled on weight management options. CC: Lipid Management, Hypertension Management Is Patient Diabetic? Yes Pain Assessment Patient in pain? no      CBG Result 202  Does patient need assistance? Functional Status Self care Ambulation Normal  Vision Screening:Left eye w/o correction: 20 / 25-1 Right Eye w/o correction: 20 / 30-1 Both eyes w/o correction:  20/ 30-2        Vision Entered By: Armenia Shannon (May 14, 2010 2:22 PM)     Menstrual Status postmenopausal   CC:  Lipid Management and Hypertension Management.  History of Present Illness:  Pt into the office for a complete physical exam  PAP - last done several years ago no family history of cervical or ovarian cancer post-menopausal married  Mammogram - no family history of breast cancer some self breast exams at home  Optho - wears glasses. Last eye exam 1 year ago  Dental - last dental exam has been several ago.  tdap - last done over 10 years ago.  Colonscopy - never had screening colonscopy  no family members with colon cancer BM every ther day - no straining  Hypertension History:      She denies headache, chest pain, and palpitations.        Positive major cardiovascular risk factors include female age 67 years old or older, diabetes, hyperlipidemia, and hypertension.  Negative major cardiovascular risk factors include non-tobacco-user status.        Further assessment for target organ damage reveals no history of ASHD, cardiac end-organ  damage (CHF/LVH), stroke/TIA, peripheral vascular disease, or hypertensive retinopathy.    Lipid Management History:      Positive NCEP/ATP III risk factors include female age 24 years old or older, diabetes, HDL cholesterol less than 40, and hypertension.  Negative NCEP/ATP III risk factors include no history of early menopause without estrogen hormone replacement, non-tobacco-user status, no ASHD (atherosclerotic heart disease), no prior stroke/TIA, no peripheral vascular disease, and no history of aortic aneurysm.        The patient states that she does not know about the "Therapeutic Lifestyle Change" diet.  The patient does not know about adjunctive measures for cholesterol lowering.  She expresses no side effects from her lipid-lowering medication.  The patient denies any symptoms to suggest myopathy or liver disease.     Habits & Providers  Alcohol-Tobacco-Diet     Alcohol drinks/day: 0     Tobacco Status: never  Exercise-Depression-Behavior     Does Patient Exercise: no     Have you felt down or hopeless? no     Have you felt little pleasure in things? no     Depression Counseling: not indicated; screening negative for depression     Drug Use: never  Comments: PHQ-9 score = 14  Allergies: 1)  ! Norvasc  Review of Systems General:  Denies loss of appetite. Eyes:  Denies blurring. ENT:  Denies earache. CV:  Denies fatigue. Resp:  Denies cough. GI:  Denies abdominal pain, nausea, and vomiting. GU:  Denies discharge. MS:  Complains of joint pain. Derm:  Denies dryness. Neuro:  Denies headaches. Psych:  Denies anxiety and depression.  Physical Exam  General:  alert.   Head:  normocephalic.   Eyes:  pupils equal and pupils round.   Ears:  bil Tm with bony  Nose:  no nasal discharge.   Mouth:  pharynx pink and moist.   Neck:  supple.   Chest Wall:  no mass.   Breasts:  skin/areolae normal and no masses.   Lungs:  normal breath sounds.   Heart:  normal rate and  regular rhythm.   Abdomen:  soft, non-tender, and normal bowel sounds.   Rectal:  no external abnormalities.   Msk:  normal ROM.   Extremities:  no edema Neurologic:  alert & oriented X3.   Skin:  color normal.   Psych:  Oriented X3.    Pelvic Exam  Vulva:      normal appearance.   Urethra and Bladder:      Urethra--normal.  Bladder--normal.   Vagina:      physiologic discharge.   Cervix:      midposition.   Uterus:      smooth.   Adnexa:      nontender bilaterally.   Rectum:      normal, heme negative stool.      Impression & Recommendations:  Problem # 1:  ROUTINE GYNECOLOGICAL EXAMINATION (ICD-V72.31) PHQ-9 score = 14 PAP done rec optho and dental exam Orders: Vision Screening (14782) KOH/ WET Mount [phone]) Pap Smear, Thin Prep ( Collection of) (H0865) Hemoccult Guaiac-1 spec.(in office) (82270)  Problem # 2:  UNSPECIFIED BREAST SCREENING (ICD-V76.10) self breast exam placcard given to pt Orders: Mammogram (Screening) (Mammo)  Problem # 3:  DIABETES MELLITUS, TYPE II (ICD-250.00) continue current meds Her updated medication list for this problem includes:    Glipizide 5 Mg Tabs (Glipizide) ..... One tablet by mouth two times a day for blood sugar    Lisinopril 40 Mg Tabs (Lisinopril) ..... One tablet by mouth daily for blood pressure **note change in dose**  Orders: Capillary Blood Glucose/CBG (78469)  Problem # 4:  HYPERTENSION, BENIGN ESSENTIAL (ICD-401.1)  Her updated medication list for this problem includes:    Diltiazem Hcl 90 Mg Tabs (Diltiazem hcl) ..... One tablet by mouth daily    Carvedilol 12.5 Mg Tabs (Carvedilol) ..... One tablet by mouth two times a day    Lisinopril 40 Mg Tabs (Lisinopril) ..... One tablet by mouth daily for blood pressure **note change in dose**  Orders: EKG w/ Interpretation (93000) T-Urine Microalbumin w/creat. ratio [phone])  Problem # 5:  OBESITY (ICD-278.00) up 3 pounds since last visit advsied pt  to join [employer] program  Complete Medication List: 1)  Citalopram Hydrobromide 40 Mg Tabs (Citalopram hydrobromide) .... One tablet by mouth daily for mood 2)  Sucralfate 1 Gm Tabs (Sucralfate) .... One tablet by mouth three times a day before meals 3)  Diltiazem Hcl 90 Mg Tabs (Diltiazem hcl) .... One tablet by mouth daily 4)  Glipizide 5 Mg Tabs (Glipizide) .... One tablet by mouth two times a day for blood sugar 5)  Pantoprazole Sodium 40 Mg Tbec (Pantoprazole sodium) .... One tablet by mouth daily for blood sugar 6)  Carvedilol 12.5 Mg Tabs (Carvedilol) .... One tablet by mouth two times a day 7)  Pravastatin Sodium 20 Mg  Tabs (Pravastatin sodium) .... One tablet by mouth nightly for cholesterol 8)  Lisinopril 40 Mg Tabs (Lisinopril) .... One tablet by mouth daily for blood pressure **note change in dose** 9)  Blood Glucose Meter Kit (Blood glucose monitoring suppl) .... Use to check blood sugar daily 10)  Lancets Misc (Lancets) .... Use to check blood sugar daily 11)  Blood Glucose Test Strp (Glucose blood) .... Use to check blood sugar daily before breakfast 12)  Tramadol Hcl 50 Mg Tabs (Tramadol hcl) .... One tablet by mouth two times a day as needed for pain  Hypertension Assessment/Plan:      The patient's hypertensive risk group is category C: Target organ damage and/or diabetes.  Her calculated 10 year risk of coronary heart disease is > 32 %.  Today's blood pressure is 140/90.  Her blood pressure goal is < 130/80.  Lipid Assessment/Plan:      Based on NCEP/ATP III, the patient's risk factor category is "history of diabetes".  The patient's lipid goals are as follows: Total cholesterol goal is 200; LDL cholesterol goal is 100; HDL cholesterol goal is 40; Triglyceride goal is 150.     Patient Instructions: 1)  Follow up in 2 months for diabetes and left knee. 2)  You will need cbg, hba1c, tdap. 3)  Will also discuss colonscopy 4)  Try to come to some of the fit smart classes 5)   You should have refills on all your medications at [facility] pharmacy Prescriptions: TRAMADOL HCL 50 MG TABS (TRAMADOL HCL) One tablet by mouth two times a day as needed for pain  #60 x 0   Entered and Authorized by:   Lehman Prom FNP   Signed by:   Lehman Prom FNP on 05/14/2010   Method used:   Faxed to ...       [facility] (retail)       [address] [place], [place]  [address]       Ph: [phone]       Fax: [phone]   RxID:   [id number]    Orders Added: 1)  Capillary Blood Glucose/CBG [82948] 2)  EKG w/ Interpretation [93000] 3)  Vision Screening [99173] 4)  T-Urine Microalbumin w/creat. ratio [[id number]] 5)  KOH/ WET Mount [87210] 6)  Pap Smear, Thin Prep ( Collection of) [Q0091] 7)  Est. Patient age 31-64 [phone])  Mammogram (Screening) [Mammo] 9)  Hemoccult Guaiac-1 spec.(in office) [82270]    Laboratory Results   Urine Tests  Date/Time Received: May 14, 2010 2:27 PM   Routine Urinalysis   Color: yellow Appearance: Clear Glucose: negative   (Normal Range: Negative) Bilirubin: negative   (Normal Range: Negative) Ketone: negative   (Normal Range: Negative) Spec. Gravity: 1.015   (Normal Range: 1.003-1.035) Blood: trace-intact   (Normal Range: Negative) pH: 7.5   (Normal Range: 5.0-8.0) Protein: negative   (Normal Range: Negative) Urobilinogen: 0.2   (Normal Range: 0-1) Nitrite: negative   (Normal Range: Negative) Leukocyte Esterace: small   (Normal Range: Negative)     Blood Tests     CBG Random:: 202mg /dL     Prevention & Chronic Care Immunizations   Influenza vaccine: Fluvax 3+  (03/11/2010)    Tetanus booster: Not documented    Pneumococcal vaccine: Not documented    H. zoster vaccine: Not documented  Colorectal Screening   Hemoccult: Not documented   Hemoccult action/deferral: Ordered  (05/14/2010)    Colonoscopy: Not documented  Other Screening   Pap  smear: Not documented    Mammogram: Not documented    DXA bone density scan: Not documented   Smoking status: never  (05/14/2010)  Diabetes Mellitus   HgbA1C: 7.1  (03/11/2010)    Eye exam: Not documented    Foot exam: yes  (01/28/2010)   High risk foot: Not documented   Foot care education: Not documented    Urine microalbumin/creatinine ratio: Not documented  Lipids   Total Cholesterol: 189  (03/25/2010)   LDL: 118  (03/25/2010)   LDL Direct: Not documented   HDL: 39  (03/25/2010)   Triglycerides: 159  (03/25/2010)    SGOT (AST): 15  (03/25/2010)   SGPT (ALT): 9  (03/25/2010)   Alkaline phosphatase: 88  (03/25/2010)   Total bilirubin: 0.6  (03/25/2010)  Hypertension   Last Blood Pressure: 140 / 90  (05/14/2010)   Serum creatinine: 0.79  (03/25/2010)   Serum potassium 4.8  (03/25/2010)  Self-Management Support :    Diabetes self-management support: Not documented    Hypertension self-management support: Not documented    Lipid self-management support: Not documented      EKG  Procedure date:  05/14/2010  Findings:      NSR

## 2010-07-15 ENCOUNTER — Encounter: Payer: Self-pay | Admitting: Nurse Practitioner

## 2010-07-15 ENCOUNTER — Telehealth (INDEPENDENT_AMBULATORY_CARE_PROVIDER_SITE_OTHER): Payer: Self-pay | Admitting: Nurse Practitioner

## 2010-07-15 ENCOUNTER — Encounter (INDEPENDENT_AMBULATORY_CARE_PROVIDER_SITE_OTHER): Payer: Self-pay | Admitting: Nurse Practitioner

## 2010-07-15 LAB — CONVERTED CEMR LAB
Glucose, Bld: 227 mg/dL
Hgb A1c MFr Bld: 7 %

## 2010-07-16 ENCOUNTER — Encounter (INDEPENDENT_AMBULATORY_CARE_PROVIDER_SITE_OTHER): Payer: Self-pay | Admitting: Nurse Practitioner

## 2010-07-24 NOTE — Assessment & Plan Note (Signed)
Summary: Diabetes/HTN   Vital Signs:  Patient profile:   65 year old female Menstrual status:  postmenopausal Weight:      220.4 pounds Temp:     98.0 degrees F oral Pulse rate:   80 / minute Pulse rhythm:   regular Resp:     20 per minute BP sitting:   130 / 80  (left arm) Cuff size:   regular  Vitals Entered By: Levon Hedger (July 15, 2010 9:09 AM) CC: follow-up visit DM...knee hurts in the back of her knee, feelin very fatigued x 1 month, Hypertension Management, Lipid Management, Depression Is Patient Diabetic? Yes Pain Assessment Patient in pain? no       Does patient need assistance? Functional Status Self care Ambulation Normal   CC:  follow-up visit DM...knee hurts in the back of her knee, feelin very fatigued x 1 month, Hypertension Management, Lipid Management, and Depression.  History of Present Illness:  Pt into the office for f/u on diabetes.   The patient denies that she feels like life is not worth living, denies that she wishes that she were dead, and denies that she has thought about ending her life.         Diabetes Management History:      The patient is a 65 years old female who comes in for evaluation of DM Type 2.  She has not been enrolled in the "Diabetic Education Program".  She states understanding of dietary principles and is following her diet appropriately.  No sensory loss is reported.  Self foot exams are not being performed.  She is checking home blood sugars.  She says that she is not exercising regularly.        Other comments include: Pt is checking blood sugar at least once per day.        No changes have been made to her treatment plan since last visit.    Hypertension History:      She denies headache, chest pain, and palpitations.  She notes no problems with any antihypertensive medication side effects.  Pt it taking meds as ordered.        Positive major cardiovascular risk factors include female age 27 years old or older,  diabetes, hyperlipidemia, and hypertension.  Negative major cardiovascular risk factors include non-tobacco-user status.        Further assessment for target organ damage reveals no history of ASHD, cardiac end-organ damage (CHF/LVH), stroke/TIA, peripheral vascular disease, or hypertensive retinopathy.    Lipid Management History:      Positive NCEP/ATP III risk factors include female age 27 years old or older, diabetes, HDL cholesterol less than 40, and hypertension.  Negative NCEP/ATP III risk factors include no history of early menopause without estrogen hormone replacement, non-tobacco-user status, no ASHD (atherosclerotic heart disease), no prior stroke/TIA, no peripheral vascular disease, and no history of aortic aneurysm.        The patient states that she does not know about the "Therapeutic Lifestyle Change" diet.  The patient does not know about adjunctive measures for cholesterol lowering.  She expresses no side effects from her lipid-lowering medication.  The patient denies any symptoms to suggest myopathy or liver disease.       Habits & Providers  Alcohol-Tobacco-Diet     Alcohol drinks/day: 0     Tobacco Status: never  Exercise-Depression-Behavior     Does Patient Exercise: no     Depression Counseling: not indicated; screening negative for depression  Drug Use: never  Allergies (verified): 1)  ! Norvasc  Family History: mother - diabetes, htn (deceased) father - deceased (lung cancer), CHF, CAD Sister - diabetes sister - diabetes, rheumatoid arthritis  Review of Systems General:  Complains of fatigue; denies fever and sleep disorder; sleeps well during the night.  gets up 3 times to go to the bathroom but she is able to go back to sleep.  She is fatigued during the day.  She has been going to help her sister run errands since her car has been in the shop and it has really worn her out. CV:  Complains of fainting. Resp:  Denies cough. GI:  Denies abdominal pain,  nausea, and vomiting. MS:  Complains of joint pain; L>R (knee). Neuro:  Denies headaches.  Physical Exam  General:  alert.  obese Head:  normocephalic.   Ears:  ear piercing(s) noted.   Lungs:  normal breath sounds.   Heart:  normal rate and regular rhythm.   Neurologic:  alert & oriented X3.    Diabetes Management Exam:    Foot Exam (with socks and/or shoes not present):       Sensory-Monofilament:          Left foot: normal          Right foot: normal       Nails:          Left foot: normal          Right foot: normal   Knee Exam  General:    obese.    Knee Exam:    Left:    Inspection:  Normal    Palpation:  Normal    Stability:  stable    Tenderness:  no    Swelling:  no    Erythema:  no   Impression & Recommendations:  Problem # 1:  DIABETES MELLITUS, TYPE II (ICD-250.00)  Doing well. Pt to continue current dose of medication Her updated medication list for this problem includes:    Glipizide 5 Mg Tabs (Glipizide) ..... One tablet by mouth two times a day for blood sugar    Lisinopril 40 Mg Tabs (Lisinopril) ..... One tablet by mouth daily for blood pressure **note change in dose**  Orders: Hemoglobin A1C (83036) Capillary Blood Glucose/CBG (82956)  Problem # 2:  HYPERTENSION, BENIGN ESSENTIAL (ICD-401.1) BP is stable  Her updated medication list for this problem includes:    Diltiazem Hcl 90 Mg Tabs (Diltiazem hcl) ..... One tablet by mouth daily    Carvedilol 12.5 Mg Tabs (Carvedilol) ..... One tablet by mouth two times a day    Lisinopril 40 Mg Tabs (Lisinopril) ..... One tablet by mouth daily for blood pressure **note change in dose**  Problem # 3:  HYPERLIPIDEMIA (ICD-272.4)  Her updated medication list for this problem includes:    Pravastatin Sodium 20 Mg Tabs (Pravastatin sodium) ..... One tablet by mouth nightly for cholesterol  Problem # 4:  OBESITY (ICD-278.00) advised pt to get more active. this would be beneficial for her knees as  well  Problem # 5:  ARTHRITIS (ICD-716.90) will start anti-inflammatory  Complete Medication List: 1)  Citalopram Hydrobromide 40 Mg Tabs (Citalopram hydrobromide) .... One tablet by mouth daily for mood 2)  Sucralfate 1 Gm Tabs (Sucralfate) .... One tablet by mouth three times a day before meals 3)  Diltiazem Hcl 90 Mg Tabs (Diltiazem hcl) .... One tablet by mouth daily 4)  Glipizide 5 Mg Tabs (Glipizide) .... One tablet  by mouth two times a day for blood sugar 5)  Pantoprazole Sodium 40 Mg Tbec (Pantoprazole sodium) .... One tablet by mouth daily for blood sugar 6)  Carvedilol 12.5 Mg Tabs (Carvedilol) .... One tablet by mouth two times a day 7)  Pravastatin Sodium 20 Mg Tabs (Pravastatin sodium) .... One tablet by mouth nightly for cholesterol 8)  Lisinopril 40 Mg Tabs (Lisinopril) .... One tablet by mouth daily for blood pressure **note change in dose** 9)  Blood Glucose Meter Kit (Blood glucose monitoring suppl) .... Use to check blood sugar daily 10)  Lancets Misc (Lancets) .... Use to check blood sugar daily 11)  Blood Glucose Test Strp (Glucose blood) .... Use to check blood sugar daily before breakfast 12)  Tramadol Hcl 50 Mg Tabs (Tramadol hcl) .... One tablet by mouth two times a day as needed for pain 13)  Meloxicam 15 Mg Tabs (Meloxicam) .... One tablet by mouth daily for joints  Diabetes Management Assessment/Plan:      The following lipid goals have been established for the patient: Total cholesterol goal of 200; LDL cholesterol goal of 100; HDL cholesterol goal of 40; Triglyceride goal of 150.  Her blood pressure goal is < 130/80.    Hypertension Assessment/Plan:      The patient's hypertensive risk group is category C: Target organ damage and/or diabetes.  Her calculated 10 year risk of coronary heart disease is 24 %.  Today's blood pressure is 130/80.  Her blood pressure goal is < 130/80.  Lipid Assessment/Plan:      Based on NCEP/ATP III, the patient's risk factor  category is "history of diabetes".  The patient's lipid goals are as follows: Total cholesterol goal is 200; LDL cholesterol goal is 100; HDL cholesterol goal is 40; Triglyceride goal is 150.    Patient Instructions: 1)  Blood pressure - doing well 2)  Diabetes - doing ok.  Hgba1c = 7.0 3)  Knee pain  -likely due to arithritis.  start meloxicam 15mg  by mouth daily. 4)  Obesity - start to walk and increase physical activity 5)  Follow up in 3 months for diabetes and high blood pressure. 6)  Will need u/a, cbg, hgba1c Prescriptions: MELOXICAM 15 MG TABS (MELOXICAM) One tablet by mouth daily for joints  #30 x 5   Entered and Authorized by:   Lehman Prom FNP   Signed by:   Lehman Prom FNP on 07/15/2010   Method used:   Historical   RxID:   [id number] TRAMADOL HCL 50 MG TABS (TRAMADOL HCL) One tablet by mouth two times a day as needed for pain  #50 x 0   Entered and Authorized by:   Lehman Prom FNP   Signed by:   Lehman Prom FNP on 07/15/2010   Method used:   Historical   RxID:   [id number]    Orders Added: 1)  Est. Patient Level IV [95284] 2)  Hemoglobin A1C [83036] 3)  Capillary Blood Glucose/CBG [82948]     Last LDL:                                                 118 (03/25/2010 9:05:00 PM)        Diabetic Foot Exam    10-g (5.07) Semmes-Weinstein Monofilament Test Performed by: Levon Hedger          Right  Foot          Left Foot Visual Inspection               Test Control      normal         normal Site 1         normal         normal Site 2         normal         normal Site 3         normal         normal Site 4         normal         normal Site 5         normal         normal Site 6         normal         normal Site 7         normal         normal Site 8         normal         normal Site 9         normal         normal Site 10         normal         normal  Impression      normal         normal   Laboratory Results     Blood Tests   Date/Time Received: July 15, 2010 9:54 AM   Glucose (random): 227 mg/dL   (Normal Range: 04-540) HGBA1C: 7.0%   (Normal Range: Non-Diabetic - 3-6%   Control Diabetic - 6-8%)

## 2010-07-24 NOTE — Letter (Signed)
Summary: [facility]  [facility]   Imported By: Arta Bruce 07/16/2010 11:12:02  _____________________________________________________________________  External Attachment:    Type:   Image     Comment:   External Document

## 2010-07-24 NOTE — Progress Notes (Signed)
Summary: Need tdap  Phone Note Other Incoming   Summary of Call: call pt - advise forgot to give tdap during visit (confirm that this office does have some before making call) ask pt if she wants to come back and get at no charge or she can remind provider AGAIN during her next visit. it is just time for her tetanus to be renewed since it had been more than10 years since the last one Initial call taken by: Lehman Prom FNP,  July 15, 2010 11:01 AM  Follow-up for Phone Call        pt is going to come back in tomorrow 07/16/10 for tdap injection at 9:00 No Charge to pt...Marland KitchenMarland KitchenMarland Kitchen Follow-up by: Levon Hedger,  July 15, 2010 1:53 PM  Additional Follow-up for Phone Call Additional follow up Details #1::        SCHEDULED TO COME 02/29 FOR LAB Additional Follow-up by: Leodis Rains,  July 15, 2010 5:07 PM

## 2010-07-24 NOTE — Assessment & Plan Note (Signed)
Summary: Tdap  Nurse Visit   Allergies: 1)  ! Norvasc  Immunizations Administered:  Tetanus Vaccine:    Vaccine Type: Tdap    Site: right deltoid    Mfr: Sanofi Pasteur    Dose: 0.5 ml    Route: IM    Given by: Hale Drone CMA    Exp. Date: 10/14/2012    Lot #: [id number]    VIS given: 04/04/08 version given July 16, 2010.  Orders Added: 1)  Tdap => 30yrs IM [90715] 2)  Admin 1st Vaccine [11914]

## 2010-08-02 LAB — HEPATIC FUNCTION PANEL
AST: 20 U/L (ref 0–37)
Bilirubin, Direct: 0.3 mg/dL (ref 0.0–0.3)
Indirect Bilirubin: 0.6 mg/dL (ref 0.3–0.9)
Total Bilirubin: 0.9 mg/dL (ref 0.3–1.2)

## 2010-08-02 LAB — GLUCOSE, CAPILLARY
Glucose-Capillary: 113 mg/dL — ABNORMAL HIGH (ref 70–99)
Glucose-Capillary: 117 mg/dL — ABNORMAL HIGH (ref 70–99)
Glucose-Capillary: 137 mg/dL — ABNORMAL HIGH (ref 70–99)
Glucose-Capillary: 146 mg/dL — ABNORMAL HIGH (ref 70–99)
Glucose-Capillary: 172 mg/dL — ABNORMAL HIGH (ref 70–99)
Glucose-Capillary: 203 mg/dL — ABNORMAL HIGH (ref 70–99)
Glucose-Capillary: 212 mg/dL — ABNORMAL HIGH (ref 70–99)
Glucose-Capillary: 350 mg/dL — ABNORMAL HIGH (ref 70–99)

## 2010-08-02 LAB — CBC
HCT: 29.8 % — ABNORMAL LOW (ref 36.0–46.0)
HCT: 38.2 % (ref 36.0–46.0)
Hemoglobin: 10.3 g/dL — ABNORMAL LOW (ref 12.0–15.0)
MCH: 32.3 pg (ref 26.0–34.0)
MCHC: 35 g/dL (ref 30.0–36.0)
MCV: 91.1 fL (ref 78.0–100.0)
MCV: 92.1 fL (ref 78.0–100.0)
MCV: 92.9 fL (ref 78.0–100.0)
Platelets: 199 10*3/uL (ref 150–400)
Platelets: 241 10*3/uL (ref 150–400)
RBC: 3.21 MIL/uL — ABNORMAL LOW (ref 3.87–5.11)
RBC: 4.19 MIL/uL (ref 3.87–5.11)
RDW: 12.4 % (ref 11.5–15.5)
WBC: 14.9 10*3/uL — ABNORMAL HIGH (ref 4.0–10.5)
WBC: 6.1 10*3/uL (ref 4.0–10.5)
WBC: 7.3 10*3/uL (ref 4.0–10.5)

## 2010-08-02 LAB — TROPONIN I: Troponin I: 0.02 ng/mL (ref 0.00–0.06)

## 2010-08-02 LAB — CULTURE, BLOOD (ROUTINE X 2): Culture: NO GROWTH

## 2010-08-02 LAB — DIFFERENTIAL
Basophils Absolute: 0 10*3/uL (ref 0.0–0.1)
Basophils Absolute: 0 10*3/uL (ref 0.0–0.1)
Basophils Relative: 0 % (ref 0–1)
Basophils Relative: 1 % (ref 0–1)
Eosinophils Absolute: 0.1 10*3/uL (ref 0.0–0.7)
Lymphocytes Relative: 32 % (ref 12–46)
Lymphs Abs: 2.5 10*3/uL (ref 0.7–4.0)
Monocytes Absolute: 0.4 10*3/uL (ref 0.1–1.0)
Monocytes Absolute: 0.4 10*3/uL (ref 0.1–1.0)
Monocytes Relative: 6 % (ref 3–12)
Neutro Abs: 11.2 10*3/uL — ABNORMAL HIGH (ref 1.7–7.7)
Neutro Abs: 2.9 10*3/uL (ref 1.7–7.7)
Neutro Abs: 4.1 10*3/uL (ref 1.7–7.7)
Neutrophils Relative %: 47 % (ref 43–77)
Neutrophils Relative %: 75 % (ref 43–77)

## 2010-08-02 LAB — URINALYSIS, ROUTINE W REFLEX MICROSCOPIC
Nitrite: NEGATIVE
Specific Gravity, Urine: 1.018 (ref 1.005–1.030)
Urobilinogen, UA: 1 mg/dL (ref 0.0–1.0)
pH: 5 (ref 5.0–8.0)

## 2010-08-02 LAB — COMPREHENSIVE METABOLIC PANEL
Albumin: 2.6 g/dL — ABNORMAL LOW (ref 3.5–5.2)
Albumin: 2.7 g/dL — ABNORMAL LOW (ref 3.5–5.2)
Alkaline Phosphatase: 57 U/L (ref 39–117)
BUN: 20 mg/dL (ref 6–23)
BUN: 30 mg/dL — ABNORMAL HIGH (ref 6–23)
CO2: 25 mEq/L (ref 19–32)
Chloride: 107 mEq/L (ref 96–112)
Creatinine, Ser: 1.42 mg/dL — ABNORMAL HIGH (ref 0.4–1.2)
Glucose, Bld: 126 mg/dL — ABNORMAL HIGH (ref 70–99)
Potassium: 4.1 mEq/L (ref 3.5–5.1)
Total Bilirubin: 0.6 mg/dL (ref 0.3–1.2)
Total Protein: 5.6 g/dL — ABNORMAL LOW (ref 6.0–8.3)

## 2010-08-02 LAB — CARDIAC PANEL(CRET KIN+CKTOT+MB+TROPI)
CK, MB: 0.9 ng/mL (ref 0.3–4.0)
CK, MB: 1.5 ng/mL (ref 0.3–4.0)
Relative Index: 0.6 (ref 0.0–2.5)
Relative Index: 0.9 (ref 0.0–2.5)
Troponin I: 0.02 ng/mL (ref 0.00–0.06)

## 2010-08-02 LAB — POCT CARDIAC MARKERS
CKMB, poc: <1 ng/mL — ABNORMAL LOW (ref 1.0–8.0)
Myoglobin, poc: 271 ng/mL (ref 12–200)
Myoglobin, poc: 323 ng/mL (ref 12–200)

## 2010-08-02 LAB — URINE MICROSCOPIC-ADD ON

## 2010-08-02 LAB — CK TOTAL AND CKMB (NOT AT ARMC): Total CK: 22 U/L (ref 7–177)

## 2010-08-02 LAB — POCT I-STAT, CHEM 8
BUN: 48 mg/dL — ABNORMAL HIGH (ref 6–23)
Chloride: 94 mEq/L — ABNORMAL LOW (ref 96–112)
Creatinine, Ser: 2.4 mg/dL — ABNORMAL HIGH (ref 0.4–1.2)
Glucose, Bld: 326 mg/dL — ABNORMAL HIGH (ref 70–99)
TCO2: 33 mmol/L (ref 0–100)

## 2010-08-02 LAB — MAGNESIUM: Magnesium: 1.6 mg/dL (ref 1.5–2.5)

## 2010-08-02 LAB — PROTIME-INR: INR: 1.03 (ref 0.00–1.49)

## 2010-08-05 LAB — URINALYSIS, ROUTINE W REFLEX MICROSCOPIC
Bilirubin Urine: NEGATIVE
Glucose, UA: NEGATIVE mg/dL
Hgb urine dipstick: NEGATIVE
Ketones, ur: NEGATIVE mg/dL
Nitrite: NEGATIVE
Protein, ur: NEGATIVE mg/dL
Specific Gravity, Urine: 1.013 (ref 1.005–1.030)
Urobilinogen, UA: 0.2 mg/dL (ref 0.0–1.0)
pH: 7.5 (ref 5.0–8.0)

## 2010-08-05 LAB — COMPREHENSIVE METABOLIC PANEL
ALT: 13 U/L (ref 0–35)
AST: 21 U/L (ref 0–37)
Albumin: 3.7 g/dL (ref 3.5–5.2)
CO2: 28 mEq/L (ref 19–32)
Calcium: 10.4 mg/dL (ref 8.4–10.5)
Creatinine, Ser: 1.38 mg/dL — ABNORMAL HIGH (ref 0.4–1.2)
GFR calc Af Amer: 47 mL/min — ABNORMAL LOW (ref >60)
Sodium: 135 mEq/L (ref 135–145)
Total Protein: 6.9 g/dL (ref 6.0–8.3)

## 2010-08-05 LAB — CBC
HCT: 35.9 % — ABNORMAL LOW (ref 36.0–46.0)
Hemoglobin: 12.7 g/dL (ref 12.0–15.0)
MCHC: 35.5 g/dL (ref 30.0–36.0)
MCV: 92.2 fL (ref 78.0–100.0)
Platelets: 237 K/uL (ref 150–400)
RBC: 3.89 MIL/uL (ref 3.87–5.11)
RDW: 12.7 % (ref 11.5–15.5)
WBC: 11 K/uL — ABNORMAL HIGH (ref 4.0–10.5)

## 2010-08-05 LAB — URINE CULTURE
Colony Count: NO GROWTH
Culture: NO GROWTH

## 2010-08-05 LAB — DIFFERENTIAL
Basophils Absolute: 0 K/uL (ref 0.0–0.1)
Basophils Relative: 0 % (ref 0–1)
Eosinophils Absolute: 0.4 K/uL (ref 0.0–0.7)
Eosinophils Relative: 3 % (ref 0–5)
Lymphocytes Relative: 19 % (ref 12–46)
Lymphs Abs: 2.1 K/uL (ref 0.7–4.0)
Monocytes Absolute: 0.6 K/uL (ref 0.1–1.0)
Monocytes Relative: 5 % (ref 3–12)
Neutro Abs: 7.9 K/uL — ABNORMAL HIGH (ref 1.7–7.7)
Neutrophils Relative %: 72 % (ref 43–77)

## 2010-08-05 LAB — POCT CARDIAC MARKERS
CKMB, poc: <1 ng/mL — ABNORMAL LOW (ref 1.0–8.0)
Myoglobin, poc: 78.8 ng/mL (ref 12–200)
Troponin i, poc: <0.05 ng/mL (ref 0.00–0.09)

## 2010-09-30 NOTE — Consult Note (Signed)
Chloe Harding, Chloe Harding                ACCOUNT NO.:  [id number]   MEDICAL RECORD NO.:  [id number]          PATIENT TYPE:  OBV   LOCATION:  5120                         FACILITY:  [facility]   PHYSICIAN:  Garnetta Buddy, M.D.   DATE OF BIRTH:  Jan 18, 1946   DATE OF CONSULTATION:  03/06/2008  DATE OF DISCHARGE:                                 CONSULTATION   REASON FOR CONSULTATION:  Hypercalcemia and renal insufficiency.   HISTORY OF PRESENTING ILLNESS:  A 65 year old previously healthy female  with history of hypertension, diabetes, depression, and arthritis with  lower back pain presented for flu shot on February 16, 2008, and then  developed subsequently nausea, vomiting and diarrhea.  She had a low-  grade fever over the weekend and was treated with Cipro for presumed  urinary tract infection.  On admission, her serum creatinine was 6 mg  per dL, serum calcium was 12 mg per dL.  She was taking lisinopril,  Glucophage, as well as non-steroidal anti-inflammatory drugs on a  chronic basis as an outpatient.   PAST MEDICAL HISTORY:  1. Asthma.  2. Hypertension.  3. Diabetes mellitus.  4. Depression.  5. Arthritis.  6. History of cholecystectomy.   ALLERGIES:  No known drug allergies.   OUTPATIENT MEDICATIONS:  1. HCTZ 25 mg daily.  2. Lisinopril 20 mg daily.  3. Lovastatin 20 mg daily.  4. Voltaren 75 mg b.i.d.  5. Glipizide 5 mg daily.  6. Celexa 20 mg daily.  7. Metformin 1 g b.i.d.  8. Coreg 12.5 mg b.i.d.   SOCIAL HISTORY:  Works as a Electrical engineer.  No alcohol, no tobacco.   FAMILY HISTORY:  Negative for cancer or end-stage renal disease.   REVIEW OF SYSTEMS:  Positive for 2 weeks of malaise and weakness.  No  fever, sweats or chills.  EYES:  No visual loss, diplopia, loss of  vision, or blurred vision.  EARS, NOSE, MOUTH, and THROAT:  No hearing  loss.  No sinusitis.  No sore throat.  CARDIOVASCULAR:  No chest pain,  shortness of breath, palpitations, or syncopal spells.   RESPIRATORY:  No  cough, wheeze, or hemoptysis.  History of asthma.  ABDOMINAL SYSTEM:  Positive for nausea and diarrhea.  Poor appetite with intentional weight  loss.  UROGENITAL:  No urgency, frequency, or dysuria.  No hematuria or  renal calculi.  NEUROLOGIC:  Complains of chronic headaches, no  seizures.  No dysarthria, dysphagia, or diplopia.  No paresthesias.  ENDOCRINE:  Positive for diabetes.  No thyroid or adrenal disease.  SKIN:  No skin rash or itching.  HEMATOLOGIC:  No cancer, no DVT, no  pulmonary embolism.  MUSCULOSKELETAL:  Positive for Voltaren use as an  outpatient.   PHYSICAL EXAMINATION:  VITAL SIGNS:  Blood pressure 127/81, pulse of 87,  and temperature 98.  HEAD AND EYES:  Normocephalic and atraumatic.  Pupils are round, equal,  and reactive.  Funduscopic evaluation benign.  EARS, NOSE, MOUTH, AND THROAT:  __________ normal.  Nasopharynx clear.  Oropharynx was clear.  NECK:  Supple.  No thyromegaly, adenopathy.  No  JVP.  No carotid bruits.  CARDIOVASCULAR:  No heaves, thrills, rubs, or gallops.  Regular rate and  rhythm.  RESPIRATORY:  Lung fields are clear to auscultation.  No wheezing or  rales to percussion.  Resonant throughout.  ABDOMEN:  Soft, nontender.  Bowel sounds present.  No  organosplenomegaly.  No hernias.  EXTREMITIES:  No cyanosis, clubbing, or edema.  Peripheral pulses 2+  radial, femoral, dorsalis pedis, and posterior tibial without bruits.   MOST RECENT LABORATORIES:  Sodium 129, potassium 4, chloride 95, CO2 of  26, BUN 95, creatinine 5.8, glucose 158, and calcium 10.6.  WBC 6,  hemoglobin 10.7, and platelets 298.  Protein 30 mg/dL in urine.  Urinalysis, 3 to 6 wbc's.  Chest x-ray revealed demineralization, normal  chest x-ray.   IMPRESSION:  1. Acute renal failure secondary to medications, volume depletion,      hypercalcemia, non-steroidal antiinflammatory, and lisinopril.  No      obvious exposure to other exogenous toxins.  Check SPEP,  check      UPEP, check renal ultrasound.  2. Hypercalcemia, suspect bony involvement with demineralization.  We      will check multiple myeloma survey, SPEP, UPEP, also need a      metastatic workup and rule out secondary hyperparathyroidism with      PTH.  The patient was taking non-steroidal anti-inflammatory drugs      as well as HCTZ as an outpatient, which both could increase the      hypercalcemia.  We would continue hydration and then proceed with      saline diuresis with Lasix.      Garnetta Buddy, M.D.  Electronically Signed     MWW/MEDQ  D:  03/06/2008  T:  03/07/2008  Job:  [id number]
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
## 2010-09-30 NOTE — H&P (Signed)
Chloe Harding, Chloe Harding                ACCOUNT NO.:  [id number]   MEDICAL RECORD NO.:  [id number]          PATIENT TYPE:  INP   LOCATION:  5120                         FACILITY:  [facility]   PHYSICIAN:  Vania Rea, M.D. DATE OF BIRTH:  02-19-1946   DATE OF ADMISSION:  03/05/2008  DATE OF DISCHARGE:                              HISTORY & PHYSICAL   CHIEF COMPLAINT:  Dizziness, weakness, and elevated calcium.   HISTORY OF PRESENT ILLNESS:  This is a 65 year old African American lady  who considers herself to be in fairly good health, but has never had a  colonoscopy, never had a mammogram, but did take a flu shot about 3  weeks ago.  The patient reports that the day after taking the flu shot,  she started having fever, chills, nausea, vomiting, and diarrhea.  This  went on for a few days.  She visited a doctor, and he noted some  abnormalities in her blood work.  He repleted the blood work again a  couple of days ago, and it continued to be abnormal.  She has continued  to feel weak and lethargic, and he called the hospital service and  requested admission for the evaluation for serum calcium apparently of  12.   PAST MEDICAL HISTORY:  Hypertension, diabetes, depression, arthritis,  and remote history of asthma.   MEDICATION:  She says, she takes about 10 medications, but did not bring  a list and cannot remember them all but they do include Coreg 3.125 mg  twice daily, glipizide 2.5 mg twice daily, metformin 1000 mg twice  daily, the other she cannot remember, but in any case she has taken no  medications for the past 3 days on the advice of her primary physician.   ALLERGIES:  No known drug allergies.   SOCIAL HISTORY:  Denies tobacco, alcohol, or illicit drug use.  Works as  a Electrical engineer.   FAMILY HISTORY:  Significant for father with coronary artery disease and  died of CA of a lung and mother with seizures, otherwise family members  are healthy.   REVIEW OF SYSTEMS:   Other then noted above, a 10-point review of systems  is unremarkable.  She has not had any asthma attacks for the past year.   PHYSICAL EXAMINATION:  GENERAL:  Pleasant elderly Caucasian lady lying  flat in bed in no acute distress.  VITAL SIGNS:  Temperature is 98, her pulse is 98, her respirations 18,  blood pressure 153/102, and she is saturating at 98% on room air.  HEENT:  Her pupils are round and equal.  Mucous membranes are pink and  anicteric.  She is mildly dehydrated.  No cervical lymphadenopathy.  No  thyromegaly.  No jugular venous distention.  CHEST:  Marked with kyphotic, but clear to auscultation bilaterally.  CARDIOVASCULAR SYSTEM:  Regular rhythm without murmur.  ABDOMEN:  Obese, soft, and nontender.  There are no masses.  EXTREMITIES:  Without edema.  She has 2+ pulses bilaterally.  CENTRAL NERVOUS SYSTEM:  Cranial nerves 2 through 12 are grossly intact.  She has no focal neurologic deficit.  No labs were drawn, because this patient is an elective admission.  There are no imaging studies.   ASSESSMENT:  History of metabolic derangement associated with lethargy  after an episode of vomiting and diarrhea.   PLAN:  1. We will start this lady on intravenous fluids.  We will get a serum      chemistry including calcium and phosphorus, and we will reevaluate      her plans, after we have these labs in hand.  If her serum calcium      remains significantly elevated, we will check intact parathyroid      hormone, and we will hydrate her aggressively, pending results of      other studies.  2. Her hypertension is uncontrolled.  She has not taken any      medications for the past 3 days.  We will restart her Coreg and      will get the medication list from her pharmacy in the morning.  3. For diabetes, we will put her on a sliding scale insulin, pending      results of her blood sugars.  Other plans as per orders.      Vania Rea, M.D.  Electronically  Signed     LC/MEDQ  D:  03/05/2008  T:  03/06/2008  Job:  [id number]

## 2010-09-30 NOTE — Discharge Summary (Signed)
Chloe Harding, Chloe Harding                ACCOUNT NO.:  [id number]   MEDICAL RECORD NO.:  [id number]          PATIENT TYPE:  INP   LOCATION:  5120                         FACILITY:  [facility]   PHYSICIAN:  Elliot Cousin, M.D.    DATE OF BIRTH:  03/06/1946   DATE OF ADMISSION:  03/05/2008  DATE OF DISCHARGE:  03/10/2008                               DISCHARGE SUMMARY   DISCHARGE DIAGNOSES:  1. Acute renal failure secondary to volume depletion, hypercalcemia,      nonsteroidal antiinflammatory drug, and ACE inhibitor.  2. Hypercalcemia possibly secondary to dehydration,      hydrochlorothiazide, and nonsteroidal antiinflammatory drug.  3. Hyponatremia.  4. 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 Bone      survey was negative.  The protein electrophoresis revealed a M-      spike of 0.35 and a serum gammaglobulin of 19.5.  The      immunoglobulin panel revealed an elevated IgM of 1410, IgG of 398,      and IgA of 61.  There was a suspicion that the results of the IgG      and IgM were transposed.  The urine electrophoresis revealed free      kappa light chains which were elevated at 9.91 and free lambda      light chains that were elevated at 5.80.  5. Type 2 diabetes mellitus.  6. Normocytic anemia.  Iron studies revealed a vitamin B12 of 728,      ferritin of 100, folate of greater than 20, total iron of 79, TIBC      of 243, and percent saturation of 33.  An outpatient GI evaluation      is warranted.  7. Hypertension.  8. Transient nausea and vomiting thought to be secondary to      anxiousness.   DISCHARGE MEDICATIONS:  1. Metformin 1000 mg half a tablet once daily (this dose has been      decreased from 1000 mg b.i.d.).  2. Change glipizide ER to 10 mg daily (this is an increased dose from      the previous 5 mg daily).  3. Coreg 12.5 mg b.i.d.  4. Celexa 40 mg daily.  5. Lovastatin 40 mg daily.  6. Percocet 5/325 mg one pill every 6 hours as needed for pain.  7. Prilosec OTC 1 tablet daily.  8. Norvasc 5 mg  daily.  9. Discontinue diclofenac, lisinopril, and hydrochlorothiazide.   DISCHARGE DISPOSITION:  The patient is being discharged to home in  improved and stable condition.  She was advised to follow up with Dr.  Caryl Never in 5-7 days.  Arrangements will be made for the patient to  follow up with nephrologist, Dr. Hyman Hopes and oncologist, Dr. Darnelle Catalan.   CONSULTATIONS:  1. Garnetta Buddy, MD  2. Valentino Hue. Magrinat, MD   PROCEDURES PERFORMED:  1. Bone survey for multiple myeloma performed on March 09, 2008.      The results revealed negative metastatic bone survey for      myelomatous changes.  2. Renal ultrasound on March 06, 2008.  The results  revealed no      evidence for obstructive uropathy.  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  3. Chest x-ray on March 05, 2008.  The results revealed low lung      volumes with bronchitic changes and right basilar atelectasis.   HISTORY:  The patient is a 65 year old woman with a past medical history  significant for hypertension, diabetes mellitus, and arthritis.  She  presented to the emergency department on March 05, 2008 after  outpatient lab data revealed significant abnormalities including acute  renal failure and hypercalcemia.  The patient had apparently been  experiencing fever, chills, nausea, vomiting, and diarrhea several days  before presenting to her primary care physician's office.  Dr. Caryl Never  ordered lab studies following her presentation to his office.  They were  initially abnormal; however, the blood work was repeated.  After the  second set of lab data returned abnormal, the patient was admitted  directly from home.   For additional details, please see the dictated history and physical.   HOSPITAL COURSE:  1. ACUTE RENAL FAILURE, HYPERCALCEMIA, AND HYPONATREMIA.  Most if not      all of  the patient's chronic medications were withheld initially.      She was started on IV fluid volume repletion with normal saline.      Her renal function was followed closely as was her urine output.      During the hospitalization, the patient's urine output was      excellent.  Because of the hypercalcemia and acute renal failure, a      number of studies were ordered including a PTH, urinalysis, urine      and serum electrophoresis, skeletal survey, and a renal ultrasound.      Subsequently, Dr. Hyman Hopes, nephrologist, was consulted.  Dr. Hyman Hopes      evaluated the patient on March 06, 2008 and discovered that the      patient had been taking NSAIDs on a regular basis for arthritic      pain.  He also noted that she was treated chronically with      hydrochlorothiazide and lisinopril.  Volume depletion from the      recent vomiting and diarrhea, hypercalcemia, hydrochlorothiazide,      and ACE inhibitor therapy all played a role in causing her acute      renal failure.  In addition, Dr. Hyman Hopes pointed out that      hypercalcemia can result from hydrochlorothiazide and NSAID use.      He agreed with volume repletion and the diagnostic workup.   The patient's renal ultrasound revealed no evidence of obstructive  uropathy. Her PTH was low at 5.7. Her urinalysis revealed small  leukocytes but negative nitrites.  Her urinalysis was also positive for  30 mg of protein.  The protein electrophoresis revealed an M-spike of  0.35 and a total serum gamma globulin of 19.5 which was slightly  elevated.  The immunofixation revealed the presence of monoclonal IgG  kappa protein.  The results of the immunoglobulin panel revealed IgA of  61, IgG of 398, and an IgM of 1410.  The urine electrophoresis in  essence revealed an elevation of both free kappa light chains and free  lambda light chains.  Given these findings, oncologist, Dr. Darnelle Catalan was  consulted.  Dr. Darnelle Catalan evaluated the patient and agreed with  the  skeletal survey which had been ordered on the 19th but apparently was  not performed.  The skeletal survey was reordered on March 08, 2008  and performed yesterday.  The results of the skeletal survey were  negative for both metastatic disease and myelomatous changes.   Per Dr. Darrall Dears assessment,  the IgM and IgG on the immunoglobulin  panel were probably transposed given the results of all of the data  presented.  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  He advised  the patient to follow up with him in 3-4 weeks.   Over the course of the hospitalization, the patient's renal function  improved significantly.  Prior to hospital discharge, the patient's BUN  was 10 and her creatinine was 1.58.  On admission, her BUN was 75 and  her creatinine was 6.09. The patient's hypercalcemia resolved  completely. Her serum calcium was 8.5 prior to discharge; it had been  11.8 initially.  Dr. Hyman Hopes advised the patient to avoid taking  lisinopril, hydrochlorothiazide, and Voltaren.  She voiced  understanding.  He will follow up with her in the upcoming weeks.  1. NORMOCYTIC ANEMIA.  At the time of the initial hospital assessment,      the patient's hemoglobin was 11.5.  With IV fluid volume repletion,      her hemoglobin fell to a nadir of 9.1.  Iron studies were ordered      and the results are above.  It appears that the patient has anemia      of chronic disease.  Given her age, an outpatient colonoscopy is      warranted for further evaluation.  2. HYPERTENSION.  The patient was restarted on Coreg during the      hospital course.  As above, the hydrochlorothiazide and lisinopril      were discontinued.  For additional blood pressure control, Norvasc      was added at 5 mg daily.  The patient had experienced mild      peripheral edema on Norvasc in the past but was willing to retry      it.  Further management will be deferred to Dr. Caryl Never.  3. TYPE 2 DIABETES MELLITUS.  The  metformin and glipizide were      withheld during the hospitalization primarily because of acute      renal failure.  Diabetes management ensued with sliding scale      NovoLog.  Her capillary blood glucose was fairly well controlled on      sliding scale NovoLog only.  Her hemoglobin A1c was found to be      5.7, excellent outpatient control.  Because of the recent acute      renal failure, the patient was advised to decrease the dose of      metformin to only 500 mg daily but to increase the glipizide ER to      10 mg daily.  She was also advised to not take the oral      hypoglycemic agents if her capillary blood glucose fell below 120      consistently.  She was advised to follow up with Dr. Caryl Never and      if her renal function returns to baseline, a consideration can be      made to restart the metformin at the previous home dose of 1000 mg      b.i.d.   Of note, a fasting lipid profile was ordered and the results revealed a  total cholesterol of 137, triglycerides of 163, HDL cholesterol of 24,  and LDL cholesterol of 80.  1. TRANSIENT NAUSEA AND VOMITING.  Following the results of the      protein and urine electrophoresis, the patient became somewhat      anxious.  She experienced transient nausea and vomiting but no      complaints of abdominal pain or painful urination.  She was given      one dose of Ativan and Celexa was restarted.  Following the restart      of Celexa and the administration of Ativan, her nausea and vomiting      completely resolved.  The following morning, the patient ate      breakfast without any ill effects.      Elliot Cousin, M.D.  Electronically Signed     DF/MEDQ  D:  03/10/2008  T:  03/11/2008  Job:  [id number]   cc:   Evelena Peat, M.D.  Garnetta Buddy, M.D.  Valentino Hue. Magrinat, M.D.

## 2010-10-03 NOTE — Op Note (Signed)
[facility]  Patient:    Chloe Harding, Chloe Harding                       MRN: [id number] Proc. Date: 03/12/00 Adm. Date:  [id number] Attending:  Milly Jakob                           Operative Report  PREOPERATIVE DIAGNOSIS:  Medial meniscal tear with questionable osteochondral injury, inferior medial condyle.  POSTOPERATIVE DIAGNOSIS: 1. Medial meniscal tear with questionable osteochondral injury, inferior    medial condyle. 2. Patellofemoral chondromalacia.  PROCEDURE: 1. Partial posterior medial meniscectomy. 2. Debridement of medial femoral condyle and medial tibial plateau. 3. Debridement of patella.  SURGEON:  Harvie Junior, M.D.  ASSISTANT:  Currie Paris. Thedore Mins.  ANESTHESIA:  Knee block with MAC.  BRIEF HISTORY:  She is a 65 year old female with a long history of having bilateral knee pain.  She ultimately has stabilized in terms of her pain on the left side, although radiographically she had much more narrowing in the right side.  She began having intermittent locking and catching pain on the right side and because of this, we treated her conservatively initially, and ultimately she failed conservative treatment and she was brought to the operating room for evaluation under anesthesia and arthroscopy.  DESCRIPTION OF PROCEDURE:  The patient was brought to the operating room and after adequate anesthesia was obtained with general anesthetic, the patient was placed on the operating table, where the right leg was prepped and draped in a sterile fashion.  Following this, routine arthroscopic examination of the knee was performed and revealed that there was an obvious radial tear in the posterior horn of the medial meniscus with a complex portion in each direction.  This would catch out into the knee joint.  At this point, this was resected with a combination of straight-biting forceps, upbiting forceps, left-hand sidebiting forceps.  The  remaining meniscal rim was contoured down with a suction shaver and was stable to probing following resection.  At this point, attention was turned to the medial femoral condyle, where there was noted to be significant grade 3 and 4 change.  This was debrided with a suction shaver to remove all loose and fragmenting pieces.  The medial tibial plateau had some grade 2 change, which was minimally debrided.  At this point, attention was turned to the lateral femoral condyle, which was noted to be normal, and the lateral meniscus normal.  The anterior cruciate ligament normal.  Attention was then turned up into the patellofemoral compartment, where there was noted to be grade 2 and 3 change on the undersurface of the patella.  This was debrided with a suction shaver back to a smooth and stable rim.  Following this, the knee was copiously irrigated and suctioned dry.  F final check was made to make sure there were no loose or fragmenting pieces, and the portals were then closed with Steri-Strips.  A sterile compressive dressing was applied and the patient was taken to the recovery room, where she was noted to be in satisfactory condition.  Estimated blood loss for the procedure was none. DD:  03/12/00 TD:  03/12/00 Job: [id number]

## 2011-02-16 LAB — URINALYSIS, ROUTINE W REFLEX MICROSCOPIC
Bilirubin Urine: NEGATIVE
Glucose, UA: NEGATIVE
Hgb urine dipstick: NEGATIVE
Specific Gravity, Urine: 1.01

## 2011-02-16 LAB — BASIC METABOLIC PANEL
BUN: 10
BUN: 28 — ABNORMAL HIGH
CO2: 20
CO2: 20
CO2: 22
CO2: 26
Calcium: 10.6 — ABNORMAL HIGH
Chloride: 105
Chloride: 111
Creatinine, Ser: 2.89 — ABNORMAL HIGH
Creatinine, Ser: 5.78 — ABNORMAL HIGH
GFR calc Af Amer: 9 — ABNORMAL LOW
GFR calc non Af Amer: 28 — ABNORMAL LOW
GFR calc non Af Amer: 33 — ABNORMAL LOW
GFR calc non Af Amer: 7 — ABNORMAL LOW
Glucose, Bld: 125 — ABNORMAL HIGH
Glucose, Bld: 126 — ABNORMAL HIGH
Glucose, Bld: 129 — ABNORMAL HIGH
Glucose, Bld: 153 — ABNORMAL HIGH
Potassium: 3.3 — ABNORMAL LOW
Potassium: 4
Sodium: 129 — ABNORMAL LOW
Sodium: 141

## 2011-02-16 LAB — UIFE/LIGHT CHAINS/TP QN, 24-HR UR
Albumin, U: DETECTED
Alpha 1, Urine: DETECTED — AB
Free Kappa Lt Chains,Ur: 9.91 — ABNORMAL HIGH (ref 0.04–1.51)
Free Kappa/Lambda Ratio: 1.71 (ref 0.46–4.00)
Free Lambda Excretion/Day: 150.8
Time: 24
Total Protein, Urine: 19.8
Volume, Urine: 2600

## 2011-02-16 LAB — CBC
HCT: 27.5 — ABNORMAL LOW
HCT: 33.5 — ABNORMAL LOW
Hemoglobin: 9.5 — ABNORMAL LOW
MCHC: 34.8
MCHC: 35.1
MCHC: 35.4
MCV: 87.8
MCV: 88.5
MCV: 88.6
MCV: 88.8
Platelets: 268
Platelets: 280
Platelets: 298
Platelets: 330
RBC: 3.16 — ABNORMAL LOW
RBC: 3.78 — ABNORMAL LOW
RDW: 12.6
RDW: 12.8
RDW: 13.1
WBC: 5.6
WBC: 6.7

## 2011-02-16 LAB — PROTEIN ELECTROPH W RFLX QUANT IMMUNOGLOBULINS
Albumin ELP: 48.5 — ABNORMAL LOW
Alpha-1-Globulin: 6.2 — ABNORMAL HIGH
Alpha-2-Globulin: 12.4 — ABNORMAL HIGH
Beta Globulin: 7.4 — ABNORMAL HIGH
M-Spike, %: 0.35
Total Protein ELP: 6.7

## 2011-02-16 LAB — GLUCOSE, CAPILLARY
Glucose-Capillary: 117 — ABNORMAL HIGH
Glucose-Capillary: 125 — ABNORMAL HIGH
Glucose-Capillary: 126 — ABNORMAL HIGH
Glucose-Capillary: 130 — ABNORMAL HIGH
Glucose-Capillary: 137 — ABNORMAL HIGH
Glucose-Capillary: 140 — ABNORMAL HIGH
Glucose-Capillary: 146 — ABNORMAL HIGH
Glucose-Capillary: 161 — ABNORMAL HIGH

## 2011-02-16 LAB — RENAL FUNCTION PANEL
Albumin: 2.8 — ABNORMAL LOW
Chloride: 109
Glucose, Bld: 123 — ABNORMAL HIGH
Phosphorus: 3.1
Potassium: 4.2
Sodium: 137

## 2011-02-16 LAB — LIPID PANEL
Cholesterol: 137
LDL Cholesterol: 80

## 2011-02-16 LAB — HEPATIC FUNCTION PANEL: Bilirubin, Direct: <0.1

## 2011-02-16 LAB — COMPREHENSIVE METABOLIC PANEL
AST: 23
Albumin: 3.5
Alkaline Phosphatase: 72
BUN: 75 — ABNORMAL HIGH
CO2: 30
Chloride: 94 — ABNORMAL LOW
Creatinine, Ser: 6.09 — ABNORMAL HIGH
GFR calc Af Amer: 9 — ABNORMAL LOW
GFR calc non Af Amer: 7 — ABNORMAL LOW
Potassium: 4.1
Total Bilirubin: 0.6

## 2011-02-16 LAB — PTH, INTACT AND CALCIUM
Calcium, Total (PTH): 11 — ABNORMAL HIGH
PTH: 5.7 — ABNORMAL LOW

## 2011-02-16 LAB — MAGNESIUM: Magnesium: 2.2

## 2011-02-16 LAB — URINE MICROSCOPIC-ADD ON

## 2011-02-16 LAB — LIPASE, BLOOD: Lipase: 47

## 2011-02-16 LAB — IRON AND TIBC
Saturation Ratios: 33
UIBC: 164

## 2011-05-15 ENCOUNTER — Other Ambulatory Visit: Payer: Self-pay | Admitting: Internal Medicine

## 2011-05-15 DIAGNOSIS — R4702 Dysphasia: Secondary | ICD-10-CM

## 2011-05-21 ENCOUNTER — Ambulatory Visit (HOSPITAL_COMMUNITY)
Admission: RE | Admit: 2011-05-21 | Discharge: 2011-05-21 | Disposition: A | Discharge status: Home / Self Care | Payer: Self-pay | Source: Ambulatory Visit | Attending: Internal Medicine | Admitting: Internal Medicine

## 2011-05-21 DIAGNOSIS — K449 Diaphragmatic hernia without obstruction or gangrene: Secondary | ICD-10-CM | POA: Insufficient documentation

## 2011-05-21 DIAGNOSIS — K224 Dyskinesia of esophagus: Secondary | ICD-10-CM | POA: Insufficient documentation

## 2011-05-21 DIAGNOSIS — R4702 Dysphasia: Secondary | ICD-10-CM

## 2011-05-21 DIAGNOSIS — R1310 Dysphagia, unspecified: Secondary | ICD-10-CM | POA: Insufficient documentation

## 2011-11-12 ENCOUNTER — Encounter (HOSPITAL_COMMUNITY): Payer: Self-pay | Admitting: Emergency Medicine

## 2011-11-12 ENCOUNTER — Emergency Department (HOSPITAL_COMMUNITY)
Admission: EM | Admit: 2011-11-12 | Discharge: 2011-11-12 | Disposition: A | Discharge status: Home / Self Care | Payer: Self-pay | Attending: Emergency Medicine | Admitting: Emergency Medicine

## 2011-11-12 DIAGNOSIS — I10 Essential (primary) hypertension: Secondary | ICD-10-CM | POA: Insufficient documentation

## 2011-11-12 DIAGNOSIS — K219 Gastro-esophageal reflux disease without esophagitis: Secondary | ICD-10-CM | POA: Insufficient documentation

## 2011-11-12 DIAGNOSIS — E119 Type 2 diabetes mellitus without complications: Secondary | ICD-10-CM | POA: Insufficient documentation

## 2011-11-12 DIAGNOSIS — E875 Hyperkalemia: Secondary | ICD-10-CM | POA: Insufficient documentation

## 2011-11-12 DIAGNOSIS — E7800 Pure hypercholesterolemia, unspecified: Secondary | ICD-10-CM | POA: Insufficient documentation

## 2011-11-12 HISTORY — DX: Disorder of kidney and ureter, unspecified: N28.9

## 2011-11-12 HISTORY — DX: Essential (primary) hypertension: I10

## 2011-11-12 HISTORY — DX: Pure hypercholesterolemia, unspecified: E78.00

## 2011-11-12 HISTORY — DX: Major depressive disorder, single episode, unspecified: F32.9

## 2011-11-12 HISTORY — DX: Depression, unspecified: F32.A

## 2011-11-12 HISTORY — DX: Reserved for inherently not codable concepts without codable children: IMO0001

## 2011-11-12 HISTORY — DX: Gastro-esophageal reflux disease without esophagitis: K21.9

## 2011-11-12 HISTORY — DX: Unspecified osteoarthritis, unspecified site: M19.90

## 2011-11-12 HISTORY — DX: Diaphragmatic hernia without obstruction or gangrene: K44.9

## 2011-11-12 LAB — BASIC METABOLIC PANEL
BUN: 24 mg/dL — ABNORMAL HIGH (ref 6–23)
Chloride: 106 mEq/L (ref 96–112)
GFR calc Af Amer: 72 mL/min — ABNORMAL LOW (ref >90)
GFR calc non Af Amer: 62 mL/min — ABNORMAL LOW (ref >90)
Potassium: 4.7 mEq/L (ref 3.5–5.1)
Sodium: 140 mEq/L (ref 135–145)

## 2011-11-12 LAB — COMPREHENSIVE METABOLIC PANEL
AST: 27 U/L (ref 0–37)
Alkaline Phosphatase: 86 U/L (ref 39–117)
BUN: 25 mg/dL — ABNORMAL HIGH (ref 6–23)
CO2: 23 mEq/L (ref 19–32)
Chloride: 102 mEq/L (ref 96–112)
Creatinine, Ser: 1.06 mg/dL (ref 0.50–1.10)
GFR calc non Af Amer: 54 mL/min — ABNORMAL LOW (ref >90)
Potassium: 5.5 mEq/L — ABNORMAL HIGH (ref 3.5–5.1)
Total Bilirubin: 0.3 mg/dL (ref 0.3–1.2)

## 2011-11-12 LAB — CBC
MCH: 32.1 pg (ref 26.0–34.0)
Platelets: 262 10*3/uL (ref 150–400)
RBC: 3.68 MIL/uL — ABNORMAL LOW (ref 3.87–5.11)
RDW: 12.9 % (ref 11.5–15.5)
WBC: 7.1 10*3/uL (ref 4.0–10.5)

## 2011-11-12 MED ORDER — SODIUM CHLORIDE 0.9 % IV BOLUS (SEPSIS)
1000.0000 mL | Freq: Once | INTRAVENOUS | Status: AC
  Administered 2011-11-12: 1000 mL via INTRAVENOUS

## 2011-11-12 NOTE — Discharge Instructions (Signed)
Hyperkalemia   Hyperkalemia is when you have too much potassium in your blood. This can be a life-threatening condition. Potassium is normally removed (excreted) from the body by the kidneys.   CAUSES   The potassium level in your body can become too high for the following reasons:   You take in too much potassium. You can do this by:   Using salt substitutes. They contain large amounts of potassium.   Taking potassium supplements from your caregiver. The dose may be too high for you.   Eating foods or taking nutritional products with potassium.   You excrete too little potassium. This can happen if:   Your kidneys are not functioning properly. Kidney (renal) disease is a very common cause of hyperkalemia.   You are taking medicines that lower your excretion of potassium, such as certain diuretic medicines.   You have an adrenal gland disease called Addison's disease.   You have a urinary tract obstruction, such as kidney stones.   You are on treatment to mechanically clean your blood (dialysis) and you skip a treatment.   You release a high amount of potassium from your cells into your blood. You may have a condition that causes potassium to move from your cells to your bloodstream. This can happen with:   Injury to muscles or other tissues. Most potassium is stored in the muscles.   Severe burns or infections.   Acidic blood plasma (acidosis). Acidosis can result from many diseases, such as uncontrolled diabetes.   SYMPTOMS   Usually, there are no symptoms unless the potassium is dangerously high or has risen very quickly. Symptoms may include:   Irregular or very slow heartbeat.   Feeling sick to your stomach (nauseous).   Tiredness (fatigue).   Nerve problems such as tingling of the skin, numbness of the hands or feet, weakness, or paralysis.   DIAGNOSIS   A simple blood test can measure the amount of potassium in your body. An electrocardiogram test of the heart can also help make the diagnosis. The heart may  beat dangerously fast or slow down and stop beating with severe hyperkalemia.   TREATMENT   Treatment depends on how bad the condition is and on the underlying cause.   If the hyperkalemia is an emergency (causing heart problems or paralysis), many different medicines can be used alone or together to lower the potassium level briefly. This may include an insulin injection even if you are not diabetic. Emergency dialysis may be needed to remove potassium from the body.   If the hyperkalemia is less severe or dangerous, the underlying cause is treated. This can include taking medicines if needed. Your prescription medicines may be changed. You may also need to take a medicine to help your body get rid of potassium. You may need to eat a diet low in potassium.   HOME CARE INSTRUCTIONS   Take medicines and supplements as directed by your caregiver.   Do not take any over-the-counter medicines, supplements, natural products, herbs, or vitamins without reviewing them with your caregiver. Certain supplements and natural food products can have high amounts of potassium. Other products (such as ibuprofen) can damage weak kidneys and raise your potassium.   You may be asked to do repeat lab tests. Be sure to follow these directions.   If you have kidney disease, you may need to follow a low potassium diet.   SEEK MEDICAL CARE IF:   You notice an irregular or very slow heartbeat.     You feel lightheaded.   You develop weakness that is unusual for you.   SEEK IMMEDIATE MEDICAL CARE IF:   You have shortness of breath.   You have chest discomfort.   You pass out (faint).   MAKE SURE YOU:   Understand these instructions.   Will watch your condition.   Will get help right away if you are not doing well or get worse.   Document Released: 04/24/2002 Document Revised: 04/23/2011 Document Reviewed: 10/09/2010   [employer]® Patient Information ©2012 [employer].

## 2011-11-12 NOTE — ED Provider Notes (Signed)
Medical screening examination/treatment/procedure(s) were performed by non-physician practitioner and as supervising physician I was immediately available for consultation/collaboration.   Ronisha Herringshaw, MD 11/12/11 2307 

## 2011-11-12 NOTE — ED Provider Notes (Signed)
History     CSN: [id number]  Arrival date & time 11/12/11  1621   First MD Initiated Contact with Patient 11/12/11 1953      Chief Complaint  Patient presents with  . Hyperkalemia     (Consider location/radiation/quality/duration/timing/severity/associated sxs/prior treatment) HPI Comments: Patient here after having been seen at [facility] yesterday - she had blood work drawn and was then sent here because they noted her potassium at 6.2 - she reports several month history of generalized weakness but denies any focal symptoms.  Denies headache, neck pain, chest pain, shortness of breath, abdominal pain, nausea, vomiting, fever or chills.  Reports no change in her urinary status - has known CKD with renal insufficiency.  Patient is a 66 y.o. female presenting with weakness. The history is provided by the patient. No language interpreter was used.  Weakness Primary symptoms do not include headaches, syncope, loss of consciousness, altered mental status, seizures, dizziness, visual change, paresthesias, focal weakness, loss of sensation, speech change, memory loss, fever, nausea or vomiting. The symptoms began 5 to 7 days ago. The symptoms are unchanged. The neurological symptoms are diffuse.  Additional symptoms include weakness. Additional symptoms do not include neck stiffness, pain, lower back pain, leg pain, loss of balance, photophobia, aura, hallucinations, nystagmus, taste disturbance, hearing loss, tinnitus, vertigo, anxiety, irritability or dysphoric mood. Medical issues also include diabetes and hypertension.    Past Medical History  Diagnosis Date  . Hypertension   . Diabetes mellitus   . Renal disorder     shutting down 4 years ago  . Arthritis   . Depression   . Reflux   . Hiatal hernia   . Hypercholesteremia     Past Surgical History  Procedure Date  . Cholecystectomy     History reviewed. No pertinent family history.  History  Substance Use Topics  .  Smoking status: Never Smoker   . Smokeless tobacco: Not on file  . Alcohol Use: No    OB History    Grav Para Term Preterm Abortions TAB SAB Ect Mult Living                  Review of Systems  Constitutional: Negative for fever, chills and irritability.  HENT: Negative for hearing loss, neck stiffness and tinnitus.   Eyes: Negative for photophobia.  Cardiovascular: Negative for syncope.  Gastrointestinal: Negative for nausea and vomiting.  Genitourinary: Negative for dysuria and difficulty urinating.  Neurological: Positive for weakness. Negative for dizziness, vertigo, speech change, focal weakness, seizures, loss of consciousness, headaches, paresthesias and loss of balance.  Psychiatric/Behavioral: Negative for hallucinations, memory loss, dysphoric mood and altered mental status.  All other systems reviewed and are negative.    Allergies  Amlodipine besylate  Home Medications   Current Outpatient Rx  Name Route Sig Dispense Refill  . CARVEDILOL 12.5 MG PO TABS Oral Take 12.5 mg by mouth 2 (two) times daily with a meal.    . CITALOPRAM HYDROBROMIDE 40 MG PO TABS Oral Take 40 mg by mouth daily.    Marland Kitchen DILTIAZEM HCL 30 MG PO TABS Oral Take 90 mg by mouth daily.    Marland Kitchen GEMFIBROZIL 600 MG PO TABS Oral Take 600 mg by mouth 2 (two) times daily before a meal.    . GLIPIZIDE 5 MG PO TABS Oral Take 5 mg by mouth 2 (two) times daily before a meal.    . LISINOPRIL-HYDROCHLOROTHIAZIDE 20-12.5 MG PO TABS Oral Take 2 tablets by mouth daily.    Marland Kitchen  THERA M PLUS PO TABS Oral Take 1 tablet by mouth daily. One a Day for 50+    . OMEPRAZOLE 20 MG PO CPDR Oral Take 40 mg by mouth 2 (two) times daily.    Marland Kitchen PRAVASTATIN SODIUM 20 MG PO TABS Oral Take 20 mg by mouth at bedtime.    . TRAMADOL HCL 50 MG PO TABS Oral Take 3 mg by mouth 2 (two) times daily as needed. For pain      BP 157/84  Pulse 81  Temp 98.5 F (36.9 C) (Oral)  Resp 19  SpO2 96%  Physical Exam  Nursing note and vitals  reviewed. Constitutional: She is oriented to person, place, and time. She appears well-developed and well-nourished. No distress.  HENT:  Head: Normocephalic and atraumatic.  Right Ear: External ear normal.  Left Ear: External ear normal.  Nose: Nose normal.  Mouth/Throat: Oropharynx is clear and moist. No oropharyngeal exudate.  Eyes: Conjunctivae are normal. Pupils are equal, round, and reactive to light. No scleral icterus.  Neck: Normal range of motion. Neck supple.  Cardiovascular: Normal rate, regular rhythm and normal heart sounds.  Exam reveals no gallop and no friction rub.   No murmur heard. Pulmonary/Chest: Effort normal and breath sounds normal. No respiratory distress. She has no wheezes. She has no rales. She exhibits no tenderness.  Abdominal: Soft. Bowel sounds are normal. She exhibits no distension. There is no tenderness.  Musculoskeletal: Normal range of motion. She exhibits no edema and no tenderness.  Lymphadenopathy:    She has no cervical adenopathy.  Neurological: She is alert and oriented to person, place, and time. No cranial nerve deficit. She exhibits normal muscle tone. Coordination normal.  Skin: Skin is warm and dry. No rash noted. No erythema. No pallor.  Psychiatric: She has a normal mood and affect. Her behavior is normal. Judgment and thought content normal.    ED Course  Procedures (including critical care time)  Labs Reviewed  COMPREHENSIVE METABOLIC PANEL - Abnormal; Notable for the following:    Potassium 5.5 (*)     Glucose, Bld 186 (*)     BUN 25 (*)     GFR calc non Af Amer 54 (*)     GFR calc Af Amer 63 (*)     All other components within normal limits  CBC - Abnormal; Notable for the following:    RBC 3.68 (*)     Hemoglobin 11.8 (*)     HCT 33.6 (*)     All other components within normal limits   No results found. Results for orders placed during the hospital encounter of 11/12/11  COMPREHENSIVE METABOLIC PANEL      Component  Value Range   Sodium 136  135 - 145 mEq/L   Potassium 5.5 (*) 3.5 - 5.1 mEq/L   Chloride 102  96 - 112 mEq/L   CO2 23  19 - 32 mEq/L   Glucose, Bld 186 (*) 70 - 99 mg/dL   BUN 25 (*) 6 - 23 mg/dL   Creatinine, Ser 1.61  0.50 - 1.10 mg/dL   Calcium 9.9  8.4 - 09.6 mg/dL   Total Protein 7.5  6.0 - 8.3 g/dL   Albumin 3.9  3.5 - 5.2 g/dL   AST 27  0 - 37 U/L   ALT 18  0 - 35 U/L   Alkaline Phosphatase 86  39 - 117 U/L   Total Bilirubin 0.3  0.3 - 1.2 mg/dL   GFR calc  non Af Amer 54 (*) >90 mL/min   GFR calc Af Amer 63 (*) >90 mL/min  CBC      Component Value Range   WBC 7.1  4.0 - 10.5 K/uL   RBC 3.68 (*) 3.87 - 5.11 MIL/uL   Hemoglobin 11.8 (*) 12.0 - 15.0 g/dL   HCT 16.1 (*) 09.6 - 04.5 %   MCV 91.3  78.0 - 100.0 fL   MCH 32.1  26.0 - 34.0 pg   MCHC 35.1  30.0 - 36.0 g/dL   RDW 40.9  81.1 - 91.4 %   Platelets 262  150 - 400 K/uL  BASIC METABOLIC PANEL      Component Value Range   Sodium 140  135 - 145 mEq/L   Potassium 4.7  3.5 - 5.1 mEq/L   Chloride 106  96 - 112 mEq/L   CO2 23  19 - 32 mEq/L   Glucose, Bld 116 (*) 70 - 99 mg/dL   BUN 24 (*) 6 - 23 mg/dL   Creatinine, Ser 7.82  0.50 - 1.10 mg/dL   Calcium 9.5  8.4 - 95.6 mg/dL   GFR calc non Af Amer 62 (*) >90 mL/min   GFR calc Af Amer 72 (*) >90 mL/min   No results found.   Date: 11/12/2011  Rate: 70  Rhythm: normal sinus rhythm  QRS Axis: normal  Intervals: normal  ST/T Wave abnormalities: normal  Conduction Disutrbances:none  Narrative Interpretation: Reviewed by Dr. Estell Harpin  Old EKG Reviewed: unchanged    Hyperkalemia   MDM  Patient with a history of CKD presents today with hyperkalemia without EKG changes - given a liter of fluids here and K has returned to normal - she will follow up with Dr. Philipp Deputy with [facility].        Chloe Harding [place], [place] 11/12/11 2228

## 2011-11-12 NOTE — ED Notes (Signed)
Reports went for 3 months check at [facility] yesterday- had blood work- got called that potassium level was 6.2; pt reports feeling fatigued, reports felt like she was going to pass out yesterday; denies n/v/d; denies CP

## 2013-09-06 ENCOUNTER — Emergency Department (HOSPITAL_COMMUNITY)
Admission: EM | Admit: 2013-09-06 | Discharge: 2013-09-06 | Disposition: A | Discharge status: Home / Self Care | Payer: Medicare Other | Attending: Emergency Medicine | Admitting: Emergency Medicine

## 2013-09-06 ENCOUNTER — Emergency Department (HOSPITAL_COMMUNITY): Payer: Medicare Other

## 2013-09-06 ENCOUNTER — Encounter (HOSPITAL_COMMUNITY): Payer: Self-pay | Admitting: Emergency Medicine

## 2013-09-06 DIAGNOSIS — E7800 Pure hypercholesterolemia, unspecified: Secondary | ICD-10-CM | POA: Insufficient documentation

## 2013-09-06 DIAGNOSIS — F329 Major depressive disorder, single episode, unspecified: Secondary | ICD-10-CM | POA: Insufficient documentation

## 2013-09-06 DIAGNOSIS — R42 Dizziness and giddiness: Secondary | ICD-10-CM | POA: Insufficient documentation

## 2013-09-06 DIAGNOSIS — R55 Syncope and collapse: Secondary | ICD-10-CM | POA: Insufficient documentation

## 2013-09-06 DIAGNOSIS — Z87448 Personal history of other diseases of urinary system: Secondary | ICD-10-CM | POA: Insufficient documentation

## 2013-09-06 DIAGNOSIS — Z8739 Personal history of other diseases of the musculoskeletal system and connective tissue: Secondary | ICD-10-CM | POA: Insufficient documentation

## 2013-09-06 DIAGNOSIS — Z79899 Other long term (current) drug therapy: Secondary | ICD-10-CM | POA: Insufficient documentation

## 2013-09-06 DIAGNOSIS — I959 Hypotension, unspecified: Secondary | ICD-10-CM | POA: Insufficient documentation

## 2013-09-06 DIAGNOSIS — F3289 Other specified depressive episodes: Secondary | ICD-10-CM | POA: Insufficient documentation

## 2013-09-06 DIAGNOSIS — I10 Essential (primary) hypertension: Secondary | ICD-10-CM | POA: Insufficient documentation

## 2013-09-06 DIAGNOSIS — K219 Gastro-esophageal reflux disease without esophagitis: Secondary | ICD-10-CM | POA: Insufficient documentation

## 2013-09-06 DIAGNOSIS — E119 Type 2 diabetes mellitus without complications: Secondary | ICD-10-CM | POA: Insufficient documentation

## 2013-09-06 LAB — CBC WITH DIFFERENTIAL/PLATELET
Basophils Absolute: 0 10*3/uL (ref 0.0–0.1)
Basophils Relative: 0 % (ref 0–1)
Eosinophils Absolute: 0.6 10*3/uL (ref 0.0–0.7)
Eosinophils Relative: 7 % — ABNORMAL HIGH (ref 0–5)
HCT: 36.3 % (ref 36.0–46.0)
Hemoglobin: 12.5 g/dL (ref 12.0–15.0)
Lymphocytes Relative: 46 % (ref 12–46)
Lymphs Abs: 4.2 10*3/uL — ABNORMAL HIGH (ref 0.7–4.0)
MCH: 31.6 pg (ref 26.0–34.0)
MCHC: 34.4 g/dL (ref 30.0–36.0)
MCV: 91.9 fL (ref 78.0–100.0)
Monocytes Absolute: 0.5 10*3/uL (ref 0.1–1.0)
Monocytes Relative: 6 % (ref 3–12)
Neutro Abs: 3.7 10*3/uL (ref 1.7–7.7)
Neutrophils Relative %: 41 % — ABNORMAL LOW (ref 43–77)
Platelets: 318 10*3/uL (ref 150–400)
RBC: 3.95 MIL/uL (ref 3.87–5.11)
RDW: 13.9 % (ref 11.5–15.5)
WBC: 9.1 10*3/uL (ref 4.0–10.5)

## 2013-09-06 LAB — URINALYSIS, ROUTINE W REFLEX MICROSCOPIC
Bilirubin Urine: NEGATIVE
Glucose, UA: NEGATIVE mg/dL
Hgb urine dipstick: NEGATIVE
Ketones, ur: NEGATIVE mg/dL
Nitrite: NEGATIVE
Protein, ur: NEGATIVE mg/dL
Specific Gravity, Urine: 1.02 (ref 1.005–1.030)
Urobilinogen, UA: 1 mg/dL (ref 0.0–1.0)
pH: 5.5 (ref 5.0–8.0)

## 2013-09-06 LAB — BASIC METABOLIC PANEL
BUN: 26 mg/dL — ABNORMAL HIGH (ref 6–23)
CO2: 20 mEq/L (ref 19–32)
Calcium: 10.2 mg/dL (ref 8.4–10.5)
Chloride: 99 mEq/L (ref 96–112)
Creatinine, Ser: 1.29 mg/dL — ABNORMAL HIGH (ref 0.50–1.10)
GFR calc Af Amer: 49 mL/min — ABNORMAL LOW (ref >90)
GFR calc non Af Amer: 42 mL/min — ABNORMAL LOW (ref >90)
Glucose, Bld: 220 mg/dL — ABNORMAL HIGH (ref 70–99)
Potassium: 4.8 mEq/L (ref 3.7–5.3)
Sodium: 137 mEq/L (ref 137–147)

## 2013-09-06 LAB — URINE MICROSCOPIC-ADD ON

## 2013-09-06 LAB — TROPONIN I: Troponin I: <0.3 ng/mL (ref <0.30)

## 2013-09-06 LAB — CBG MONITORING, ED: Glucose-Capillary: 185 mg/dL — ABNORMAL HIGH (ref 70–99)

## 2013-09-06 MED ORDER — SODIUM CHLORIDE 0.9 % IV BOLUS (SEPSIS)
1000.0000 mL | Freq: Once | INTRAVENOUS | Status: AC
  Administered 2013-09-06: 1000 mL via INTRAVENOUS

## 2013-09-06 NOTE — Discharge Instructions (Signed)
Do not take your blood pressure medication tommorow (carvedilol and lisinopril-hydrochlorothiazide). Drink plenty of fluids. Resume taking you medications as prescribed on Friday.   Hypotension As your heart beats, it forces blood through your arteries. This force is your blood pressure. If your blood pressure is too low for you to go about your normal activities or to support the organs of your body, you have hypotension. Hypotension is also referred to as low blood pressure. When your blood pressure becomes too low, you may not get enough blood to your brain. As a result, you may feel weak, feel lightheaded, or develop a rapid heart rate. In a more severe case, you may faint. CAUSES Various conditions can cause hypotension. These include:  Blood loss.  Dehydration.  Heart or endocrine problems.  Pregnancy.  Severe infection.  Not having a well-balanced diet filled with needed nutrients.  Severe allergic reactions (anaphylaxis). Some medicines, such as blood pressure medicine or water pills (diuretics), may lower your blood pressure below normal. Sometimes taking too much medicine or taking medicine not as directed can cause hypotension. TREATMENT  Hospitalization is sometimes required for hypotension if fluid or blood replacement is needed, if time is needed for medicines to wear off, or if further monitoring is needed. Treatment might include changing your diet, changing your medicines (including medicines aimed at raising your blood pressure), and use of support stockings. HOME CARE INSTRUCTIONS   Drink enough fluids to keep your urine clear or pale yellow.  Take your medicines as directed by your health care provider.  Get up slowly from reclining or sitting positions. This gives your blood pressure a chance to adjust.  Wear support stockings as directed by your health care provider.  Maintain a healthy diet by including nutritious food, such as fruits, vegetables, nuts, whole  grains, and lean meats. SEEK MEDICAL CARE IF:  You have vomiting or diarrhea.  You have a fever for more than 2 3 days.  You feel more thirsty than usual.  You feel weak and tired. SEEK IMMEDIATE MEDICAL CARE IF:   You have chest pain or a fast or irregular heartbeat.  You have a loss of feeling in some part of your body, or you lose movement in your arms or legs.  You have trouble speaking.  You become sweaty or feel lightheaded.  You faint. MAKE SURE YOU:   Understand these instructions.  Will watch your condition.  Will get help right away if you are not doing well or get worse. Document Released: 05/04/2005 Document Revised: 02/22/2013 Document Reviewed: 11/04/2012 [facility] Patient Information 2014 [place].  Near-Syncope Near-syncope (commonly known as near fainting) is sudden weakness, dizziness, or feeling like you might pass out. This can happen when getting up or while standing for a long time. It is caused by a sudden decrease in blood flow to the brain, which can occur for various reasons. Most of the reasons are not serious.  HOME CARE Watch your condition for any changes.  Have someone stay with you until you feel stable.  If you feel like you are going to pass out:  Lie down right away.  Breathe deeply and steadily.  Move only when the feeling has gone away. Most of the time, this feeling lasts only a few minutes. You may feel tired for several hours.  Drink enough fluids to keep your pee (urine) clear or pale yellow.  If you are taking blood pressure or heart medicine, stand up slowly.  Follow up with  your doctor as told. GET HELP RIGHT AWAY IF:   You have a severe headache.  You have unusual pain in the chest, belly (abdomen), or back.  You have bleeding from the mouth or butt (rectum), or you have black or tarry poop (stool).  You feel your heart beat differently than normal, or you have a very fast pulse.  You pass out, or you  twitch and shake when you pass out.  You pass out when sitting or lying down.  You feel confused.  You have trouble walking.  You are weak.  You have vision problems. MAKE SURE YOU:   Understand these instructions.  Will watch your condition.  Will get help right away if you are not doing well or get worse. Document Released: 10/21/2007 Document Revised: 01/04/2013 Document Reviewed: 10/07/2012 [facility] Patient Information 2014 [place].

## 2013-09-06 NOTE — ED Notes (Signed)
Pt reports having 2 near syncopal episodes today. A&O x 4.

## 2013-09-06 NOTE — ED Notes (Signed)
Pt c/o multiple episodes of near syncope and blurred vison starting this afternoon.

## 2013-09-06 NOTE — ED Notes (Signed)
Pt ambulatory with steady gait, denies any dizziness at this time.

## 2013-09-12 NOTE — ED Provider Notes (Signed)
CSN: [id number]     Arrival date & time 09/06/13  1508 History   First MD Initiated Contact with Patient 09/06/13 1557     Chief Complaint  Patient presents with  . Near Syncope  . Hypotension     (Consider location/radiation/quality/duration/timing/severity/associated sxs/prior Treatment) HPI  68 six-year-old female with near syncope. Onset today. Several episodes. Feels lightheaded as if she may pass out. She says this with change of position. Improves with resting. No history of similar symptoms. History of hypertension on medications. She reports taking these meds as instructed. No fevers or chills. Denies any pain. She was breathing is normal. No unusual leg pain or swelling. No headaches. No neck pain.  Past Medical History  Diagnosis Date  . Hypertension   . Diabetes mellitus   . Renal disorder     shutting down 4 years ago  . Arthritis   . Depression   . Reflux   . Hiatal hernia   . Hypercholesteremia    Past Surgical History  Procedure Laterality Date  . Cholecystectomy     No family history on file. History  Substance Use Topics  . Smoking status: Never Smoker   . Smokeless tobacco: Not on file  . Alcohol Use: No   OB History   Grav Para Term Preterm Abortions TAB SAB Ect Mult Living                 Review of Systems  All systems reviewed and negative, other than as noted in HPI.   Allergies  Amlodipine besylate  Home Medications   Prior to Admission medications   Medication Sig Start Date End Date Taking? Authorizing Provider  carvedilol (COREG) 12.5 MG tablet Take 12.5 mg by mouth 2 (two) times daily with a meal.   Yes Historical Provider, MD  Chlorpheniramine-DM (CORICIDIN COUGH/COLD) 4-30 MG TABS Take 2 capsules by mouth every 4 (four) hours as needed (coughing).   Yes Historical Provider, MD  citalopram (CELEXA) 40 MG tablet Take 40 mg by mouth daily.   Yes Historical Provider, MD  diltiazem (CARDIZEM) 30 MG tablet Take 90 mg by mouth daily.    Yes Historical Provider, MD  gemfibrozil (LOPID) 600 MG tablet Take 600 mg by mouth 2 (two) times daily before a meal.   Yes Historical Provider, MD  glipiZIDE (GLUCOTROL) 5 MG tablet Take 5 mg by mouth 2 (two) times daily before a meal.   Yes Historical Provider, MD  lisinopril-hydrochlorothiazide (PRINZIDE,ZESTORETIC) 20-12.5 MG per tablet Take 1 tablet by mouth 2 (two) times daily.    Yes Historical Provider, MD  Multiple Vitamins-Minerals (MULTIVITAMINS THER. W/MINERALS) TABS Take 1 tablet by mouth daily. One a Day for 50+   Yes Historical Provider, MD  omeprazole (PRILOSEC) 20 MG capsule Take 40 mg by mouth 2 (two) times daily.   Yes Historical Provider, MD  pravastatin (PRAVACHOL) 20 MG tablet Take 20 mg by mouth at bedtime.   Yes Historical Provider, MD  traMADol (ULTRAM) 50 MG tablet Take 50 mg by mouth 3 (three) times daily as needed for moderate pain. For pain   Yes Historical Provider, MD   BP 134/66  Pulse 81  Temp(Src) 99.4 F (37.4 C) (Rectal)  Resp 19  SpO2 98% Physical Exam  Nursing note and vitals reviewed. Constitutional: She is oriented to person, place, and time. She appears well-developed and well-nourished. No distress.  HENT:  Head: Normocephalic and atraumatic.  Eyes: Conjunctivae are normal. Right eye exhibits no discharge. Left eye exhibits  no discharge.  Neck: Neck supple.  Cardiovascular: Normal rate, regular rhythm and normal heart sounds.  Exam reveals no gallop and no friction rub.   No murmur heard. Pulmonary/Chest: Effort normal and breath sounds normal. No respiratory distress.  Abdominal: Soft. She exhibits no distension. There is no tenderness.  Musculoskeletal: She exhibits no edema and no tenderness.  Neurological: She is alert and oriented to person, place, and time. No cranial nerve deficit. She exhibits normal muscle tone. Coordination normal.  Skin: Skin is warm and dry.  Psychiatric: She has a normal mood and affect. Her behavior is normal.  Thought content normal.    ED Course  Procedures (including critical care time) Labs Review Labs Reviewed  CBC WITH DIFFERENTIAL - Abnormal; Notable for the following:    Neutrophils Relative % 41 (*)    Lymphs Abs 4.2 (*)    Eosinophils Relative 7 (*)    All other components within normal limits  BASIC METABOLIC PANEL - Abnormal; Notable for the following:    Glucose, Bld 220 (*)    BUN 26 (*)    Creatinine, Ser 1.29 (*)    GFR calc non Af Amer 42 (*)    GFR calc Af Amer 49 (*)    All other components within normal limits  URINALYSIS, ROUTINE W REFLEX MICROSCOPIC - Abnormal; Notable for the following:    APPearance CLOUDY (*)    Leukocytes, UA MODERATE (*)    All other components within normal limits  URINE MICROSCOPIC-ADD ON - Abnormal; Notable for the following:    Bacteria, UA FEW (*)    Casts HYALINE CASTS (*)    All other components within normal limits  CBG MONITORING, ED - Abnormal; Notable for the following:    Glucose-Capillary 185 (*)    All other components within normal limits  TROPONIN I    Imaging Review No results found.   EKG Interpretation   Date/Time:  Wednesday September 06 2013 15:20:10 EDT Ventricular Rate:  80 PR Interval:  156 QRS Duration: 87 QT Interval:  378 QTC Calculation: 436 R Axis:   13 Text Interpretation:  Sinus rhythm Low voltage, precordial leads No  significant change since last tracing Confirmed by WARD,  DO, KRISTEN  ([id number]) on 09/06/2013 3:30:45 PM      MDM   Final diagnoses:  Hypotension  Pre-syncope    68 year old female with syncope. Suspect related to her hypertension. The etiology of this is not completely clear to me though. Her symptoms and her blood pressure normalized and her IV fluids. She was seen bleeding in the emergency room with no further complaints. She is on blood pressure medications. She is instructed to hold these for 2 days and then she can resume after that assuming she still remained symptom-free.  She is afebrile. No leukocytosis. Doubt she is septic. Doubt dysrhythmia. Creatinine is mildly elevated. There may be some element of dehydration.    Virgel Manifold, MD 09/12/13 1434

## 2013-10-31 ENCOUNTER — Other Ambulatory Visit: Payer: Self-pay | Admitting: Internal Medicine

## 2013-10-31 DIAGNOSIS — Z1231 Encounter for screening mammogram for malignant neoplasm of breast: Secondary | ICD-10-CM

## 2013-11-16 ENCOUNTER — Ambulatory Visit
Admission: RE | Admit: 2013-11-16 | Discharge: 2013-11-16 | Disposition: A | Discharge status: Home / Self Care | Payer: Medicare Other | Source: Ambulatory Visit | Attending: Internal Medicine | Admitting: Internal Medicine

## 2013-11-16 DIAGNOSIS — Z1231 Encounter for screening mammogram for malignant neoplasm of breast: Secondary | ICD-10-CM

## 2014-04-07 ENCOUNTER — Encounter (HOSPITAL_COMMUNITY): Payer: Self-pay | Admitting: *Deleted

## 2014-04-07 ENCOUNTER — Emergency Department (HOSPITAL_COMMUNITY): Payer: Medicare Other

## 2014-04-07 ENCOUNTER — Emergency Department (HOSPITAL_COMMUNITY)
Admission: EM | Admit: 2014-04-07 | Discharge: 2014-04-07 | Disposition: A | Discharge status: Home / Self Care | Payer: Medicare Other | Attending: Emergency Medicine | Admitting: Emergency Medicine

## 2014-04-07 DIAGNOSIS — E1165 Type 2 diabetes mellitus with hyperglycemia: Secondary | ICD-10-CM | POA: Diagnosis not present

## 2014-04-07 DIAGNOSIS — M1990 Unspecified osteoarthritis, unspecified site: Secondary | ICD-10-CM | POA: Insufficient documentation

## 2014-04-07 DIAGNOSIS — I10 Essential (primary) hypertension: Secondary | ICD-10-CM | POA: Insufficient documentation

## 2014-04-07 DIAGNOSIS — Z79899 Other long term (current) drug therapy: Secondary | ICD-10-CM | POA: Insufficient documentation

## 2014-04-07 DIAGNOSIS — M25512 Pain in left shoulder: Secondary | ICD-10-CM

## 2014-04-07 DIAGNOSIS — E780 Pure hypercholesterolemia: Secondary | ICD-10-CM | POA: Diagnosis not present

## 2014-04-07 DIAGNOSIS — R112 Nausea with vomiting, unspecified: Secondary | ICD-10-CM | POA: Diagnosis present

## 2014-04-07 DIAGNOSIS — K219 Gastro-esophageal reflux disease without esophagitis: Secondary | ICD-10-CM | POA: Diagnosis not present

## 2014-04-07 DIAGNOSIS — F329 Major depressive disorder, single episode, unspecified: Secondary | ICD-10-CM | POA: Diagnosis not present

## 2014-04-07 DIAGNOSIS — Z8742 Personal history of other diseases of the female genital tract: Secondary | ICD-10-CM | POA: Diagnosis not present

## 2014-04-07 DIAGNOSIS — G8911 Acute pain due to trauma: Secondary | ICD-10-CM | POA: Diagnosis not present

## 2014-04-07 DIAGNOSIS — R739 Hyperglycemia, unspecified: Secondary | ICD-10-CM

## 2014-04-07 LAB — BLOOD GAS, VENOUS
Acid-base deficit: 1 mmol/L (ref 0.0–2.0)
Bicarbonate: 24.3 mEq/L — ABNORMAL HIGH (ref 20.0–24.0)
O2 Saturation: 62.7 %
PATIENT TEMPERATURE: 98.6
PH VEN: 7.351 — AB (ref 7.250–7.300)
TCO2: 22.1 mmol/L (ref 0–100)
pCO2, Ven: 45.1 mmHg (ref 45.0–50.0)

## 2014-04-07 LAB — CBC WITH DIFFERENTIAL/PLATELET
BASOS ABS: 0 10*3/uL (ref 0.0–0.1)
Basophils Relative: 0 % (ref 0–1)
Eosinophils Absolute: 0.3 10*3/uL (ref 0.0–0.7)
Eosinophils Relative: 4 % (ref 0–5)
HEMATOCRIT: 31.7 % — AB (ref 36.0–46.0)
Hemoglobin: 10.7 g/dL — ABNORMAL LOW (ref 12.0–15.0)
LYMPHS PCT: 14 % (ref 12–46)
Lymphs Abs: 1.1 10*3/uL (ref 0.7–4.0)
MCH: 31.6 pg (ref 26.0–34.0)
MCHC: 33.8 g/dL (ref 30.0–36.0)
MCV: 93.5 fL (ref 78.0–100.0)
Monocytes Absolute: 0.4 10*3/uL (ref 0.1–1.0)
Monocytes Relative: 5 % (ref 3–12)
NEUTROS ABS: 6.2 10*3/uL (ref 1.7–7.7)
NEUTROS PCT: 77 % (ref 43–77)
PLATELETS: 230 10*3/uL (ref 150–400)
RBC: 3.39 MIL/uL — ABNORMAL LOW (ref 3.87–5.11)
RDW: 14.3 % (ref 11.5–15.5)
WBC: 8 10*3/uL (ref 4.0–10.5)

## 2014-04-07 LAB — COMPREHENSIVE METABOLIC PANEL
ALT: 15 U/L (ref 0–35)
AST: 16 U/L (ref 0–37)
Albumin: 3.3 g/dL — ABNORMAL LOW (ref 3.5–5.2)
Alkaline Phosphatase: 108 U/L (ref 39–117)
Anion gap: 15 (ref 5–15)
BILIRUBIN TOTAL: 0.3 mg/dL (ref 0.3–1.2)
BUN: 29 mg/dL — ABNORMAL HIGH (ref 6–23)
CHLORIDE: 104 meq/L (ref 96–112)
CO2: 24 meq/L (ref 19–32)
Calcium: 10.1 mg/dL (ref 8.4–10.5)
Creatinine, Ser: 0.98 mg/dL (ref 0.50–1.10)
GFR calc Af Amer: 68 mL/min — ABNORMAL LOW (ref >90)
GFR, EST NON AFRICAN AMERICAN: 58 mL/min — AB (ref >90)
Glucose, Bld: 202 mg/dL — ABNORMAL HIGH (ref 70–99)
POTASSIUM: 5 meq/L (ref 3.7–5.3)
SODIUM: 143 meq/L (ref 137–147)
Total Protein: 7.3 g/dL (ref 6.0–8.3)

## 2014-04-07 LAB — URINALYSIS, ROUTINE W REFLEX MICROSCOPIC
BILIRUBIN URINE: NEGATIVE
GLUCOSE, UA: NEGATIVE mg/dL
Hgb urine dipstick: NEGATIVE
Ketones, ur: NEGATIVE mg/dL
Leukocytes, UA: NEGATIVE
NITRITE: NEGATIVE
PH: 6 (ref 5.0–8.0)
PROTEIN: NEGATIVE mg/dL
SPECIFIC GRAVITY, URINE: 1.015 (ref 1.005–1.030)
UROBILINOGEN UA: 0.2 mg/dL (ref 0.0–1.0)

## 2014-04-07 LAB — CBG MONITORING, ED: GLUCOSE-CAPILLARY: 198 mg/dL — AB (ref 70–99)

## 2014-04-07 LAB — LIPASE, BLOOD: Lipase: 47 U/L (ref 11–59)

## 2014-04-07 MED ORDER — HYDROCODONE-ACETAMINOPHEN 5-325 MG PO TABS: 1.0000 | ORAL_TABLET | Freq: Four times a day (QID) | ORAL | Status: DC | PRN

## 2014-04-07 MED ORDER — HYDROMORPHONE HCL 1 MG/ML IJ SOLN
0.5000 mg | Freq: Once | INTRAMUSCULAR | Status: AC
  Administered 2014-04-07: 0.5 mg via INTRAVENOUS
  Filled 2014-04-07: qty 1

## 2014-04-07 MED ORDER — SODIUM CHLORIDE 0.9 % IV BOLUS (SEPSIS)
1000.0000 mL | Freq: Once | INTRAVENOUS | Status: AC
  Administered 2014-04-07: 1000 mL via INTRAVENOUS
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
## 2014-04-07 NOTE — Discharge Instructions (Signed)
As discussed, your evaluation today has been largely reassuring.  But, it is important that you monitor your condition carefully, and do not hesitate to return to the ED if you develop new, or concerning changes in your condition.  Remember that it is important to discuss your medications for diabetes with your primary care physician.  Equally important is to maintain her follow-up visit with your orthopedist for continued evaluation of your arm pain.  Otherwise, please follow-up with your physician for appropriate ongoing care.
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
## 2014-04-07 NOTE — ED Notes (Signed)
Patient still unable to urinate  

## 2014-04-16 ENCOUNTER — Other Ambulatory Visit: Payer: Self-pay | Admitting: Orthopedic Surgery

## 2014-04-16 DIAGNOSIS — M5441 Lumbago with sciatica, right side: Secondary | ICD-10-CM

## 2014-04-18 ENCOUNTER — Ambulatory Visit
Admission: RE | Admit: 2014-04-18 | Discharge: 2014-04-18 | Disposition: A | Discharge status: Home / Self Care | Payer: Medicare Other | Source: Ambulatory Visit | Attending: Orthopedic Surgery | Admitting: Orthopedic Surgery

## 2014-04-18 DIAGNOSIS — M5441 Lumbago with sciatica, right side: Secondary | ICD-10-CM

## 2014-04-22 ENCOUNTER — Other Ambulatory Visit: Payer: Medicare Other

## 2014-04-23 ENCOUNTER — Other Ambulatory Visit: Payer: Self-pay | Admitting: Orthopedic Surgery

## 2014-04-23 DIAGNOSIS — M542 Cervicalgia: Secondary | ICD-10-CM

## 2014-05-01 ENCOUNTER — Ambulatory Visit
Admission: RE | Admit: 2014-05-01 | Discharge: 2014-05-01 | Disposition: A | Discharge status: Home / Self Care | Payer: Medicare Other | Source: Ambulatory Visit | Attending: Orthopedic Surgery | Admitting: Orthopedic Surgery

## 2014-05-01 DIAGNOSIS — M542 Cervicalgia: Secondary | ICD-10-CM

## 2014-05-24 DIAGNOSIS — G5601 Carpal tunnel syndrome, right upper limb: Secondary | ICD-10-CM | POA: Diagnosis not present

## 2014-05-24 DIAGNOSIS — M542 Cervicalgia: Secondary | ICD-10-CM | POA: Diagnosis not present

## 2014-05-24 DIAGNOSIS — M5417 Radiculopathy, lumbosacral region: Secondary | ICD-10-CM | POA: Diagnosis not present

## 2014-05-24 DIAGNOSIS — G5602 Carpal tunnel syndrome, left upper limb: Secondary | ICD-10-CM | POA: Diagnosis not present

## 2014-05-24 DIAGNOSIS — G603 Idiopathic progressive neuropathy: Secondary | ICD-10-CM | POA: Diagnosis not present

## 2014-05-29 DIAGNOSIS — M25512 Pain in left shoulder: Secondary | ICD-10-CM | POA: Diagnosis not present

## 2014-05-29 DIAGNOSIS — I10 Essential (primary) hypertension: Secondary | ICD-10-CM | POA: Diagnosis not present

## 2014-05-29 DIAGNOSIS — E1140 Type 2 diabetes mellitus with diabetic neuropathy, unspecified: Secondary | ICD-10-CM | POA: Diagnosis not present

## 2014-05-29 DIAGNOSIS — M47892 Other spondylosis, cervical region: Secondary | ICD-10-CM | POA: Diagnosis not present

## 2014-07-05 DIAGNOSIS — M5417 Radiculopathy, lumbosacral region: Secondary | ICD-10-CM | POA: Diagnosis not present

## 2014-07-05 DIAGNOSIS — G5602 Carpal tunnel syndrome, left upper limb: Secondary | ICD-10-CM | POA: Diagnosis not present

## 2014-07-05 DIAGNOSIS — G603 Idiopathic progressive neuropathy: Secondary | ICD-10-CM | POA: Diagnosis not present

## 2014-07-05 DIAGNOSIS — M542 Cervicalgia: Secondary | ICD-10-CM | POA: Diagnosis not present

## 2014-07-05 DIAGNOSIS — G2581 Restless legs syndrome: Secondary | ICD-10-CM | POA: Diagnosis not present

## 2014-07-10 DIAGNOSIS — E784 Other hyperlipidemia: Secondary | ICD-10-CM | POA: Diagnosis not present

## 2014-07-10 DIAGNOSIS — M47892 Other spondylosis, cervical region: Secondary | ICD-10-CM | POA: Diagnosis not present

## 2014-07-10 DIAGNOSIS — I10 Essential (primary) hypertension: Secondary | ICD-10-CM | POA: Diagnosis not present

## 2014-07-10 DIAGNOSIS — E1140 Type 2 diabetes mellitus with diabetic neuropathy, unspecified: Secondary | ICD-10-CM | POA: Diagnosis not present

## 2014-07-10 DIAGNOSIS — F329 Major depressive disorder, single episode, unspecified: Secondary | ICD-10-CM | POA: Diagnosis not present

## 2014-07-17 DIAGNOSIS — E1142 Type 2 diabetes mellitus with diabetic polyneuropathy: Secondary | ICD-10-CM | POA: Diagnosis not present

## 2014-07-24 DIAGNOSIS — H43813 Vitreous degeneration, bilateral: Secondary | ICD-10-CM | POA: Diagnosis not present

## 2014-07-24 DIAGNOSIS — E119 Type 2 diabetes mellitus without complications: Secondary | ICD-10-CM | POA: Diagnosis not present

## 2014-07-24 DIAGNOSIS — H40013 Open angle with borderline findings, low risk, bilateral: Secondary | ICD-10-CM | POA: Diagnosis not present

## 2014-07-24 DIAGNOSIS — H2513 Age-related nuclear cataract, bilateral: Secondary | ICD-10-CM | POA: Diagnosis not present

## 2014-07-24 DIAGNOSIS — D3132 Benign neoplasm of left choroid: Secondary | ICD-10-CM | POA: Diagnosis not present

## 2014-08-02 DIAGNOSIS — G603 Idiopathic progressive neuropathy: Secondary | ICD-10-CM | POA: Diagnosis not present

## 2014-08-02 DIAGNOSIS — M5417 Radiculopathy, lumbosacral region: Secondary | ICD-10-CM | POA: Diagnosis not present

## 2014-08-02 DIAGNOSIS — G2581 Restless legs syndrome: Secondary | ICD-10-CM | POA: Diagnosis not present

## 2014-08-02 DIAGNOSIS — G5602 Carpal tunnel syndrome, left upper limb: Secondary | ICD-10-CM | POA: Diagnosis not present

## 2014-08-02 DIAGNOSIS — M5412 Radiculopathy, cervical region: Secondary | ICD-10-CM | POA: Diagnosis not present

## 2014-09-11 DIAGNOSIS — F329 Major depressive disorder, single episode, unspecified: Secondary | ICD-10-CM | POA: Diagnosis not present

## 2014-09-11 DIAGNOSIS — E784 Other hyperlipidemia: Secondary | ICD-10-CM | POA: Diagnosis not present

## 2014-09-11 DIAGNOSIS — E1040 Type 1 diabetes mellitus with diabetic neuropathy, unspecified: Secondary | ICD-10-CM | POA: Diagnosis not present

## 2014-09-11 DIAGNOSIS — E1140 Type 2 diabetes mellitus with diabetic neuropathy, unspecified: Secondary | ICD-10-CM | POA: Diagnosis not present

## 2014-09-11 DIAGNOSIS — I10 Essential (primary) hypertension: Secondary | ICD-10-CM | POA: Diagnosis not present

## 2014-10-23 DIAGNOSIS — I10 Essential (primary) hypertension: Secondary | ICD-10-CM | POA: Diagnosis not present

## 2014-10-23 DIAGNOSIS — E1140 Type 2 diabetes mellitus with diabetic neuropathy, unspecified: Secondary | ICD-10-CM | POA: Diagnosis not present

## 2014-10-23 DIAGNOSIS — N390 Urinary tract infection, site not specified: Secondary | ICD-10-CM | POA: Diagnosis not present

## 2014-10-23 DIAGNOSIS — E784 Other hyperlipidemia: Secondary | ICD-10-CM | POA: Diagnosis not present

## 2014-10-30 DIAGNOSIS — I10 Essential (primary) hypertension: Secondary | ICD-10-CM | POA: Diagnosis not present

## 2014-10-30 DIAGNOSIS — R600 Localized edema: Secondary | ICD-10-CM | POA: Diagnosis not present

## 2014-10-30 DIAGNOSIS — E1140 Type 2 diabetes mellitus with diabetic neuropathy, unspecified: Secondary | ICD-10-CM | POA: Diagnosis not present

## 2014-11-08 DIAGNOSIS — G5602 Carpal tunnel syndrome, left upper limb: Secondary | ICD-10-CM | POA: Diagnosis not present

## 2014-11-08 DIAGNOSIS — G5601 Carpal tunnel syndrome, right upper limb: Secondary | ICD-10-CM | POA: Diagnosis not present

## 2014-11-08 DIAGNOSIS — M5441 Lumbago with sciatica, right side: Secondary | ICD-10-CM | POA: Diagnosis not present

## 2014-11-08 DIAGNOSIS — G603 Idiopathic progressive neuropathy: Secondary | ICD-10-CM | POA: Diagnosis not present

## 2014-11-08 DIAGNOSIS — M5442 Lumbago with sciatica, left side: Secondary | ICD-10-CM | POA: Diagnosis not present

## 2014-11-13 DIAGNOSIS — E784 Other hyperlipidemia: Secondary | ICD-10-CM | POA: Diagnosis not present

## 2014-11-13 DIAGNOSIS — E1040 Type 1 diabetes mellitus with diabetic neuropathy, unspecified: Secondary | ICD-10-CM | POA: Diagnosis not present

## 2014-11-13 DIAGNOSIS — I10 Essential (primary) hypertension: Secondary | ICD-10-CM | POA: Diagnosis not present

## 2014-11-13 DIAGNOSIS — E1140 Type 2 diabetes mellitus with diabetic neuropathy, unspecified: Secondary | ICD-10-CM | POA: Diagnosis not present

## 2014-12-11 DIAGNOSIS — E1040 Type 1 diabetes mellitus with diabetic neuropathy, unspecified: Secondary | ICD-10-CM | POA: Diagnosis not present

## 2014-12-11 DIAGNOSIS — E1140 Type 2 diabetes mellitus with diabetic neuropathy, unspecified: Secondary | ICD-10-CM | POA: Diagnosis not present

## 2014-12-11 DIAGNOSIS — K219 Gastro-esophageal reflux disease without esophagitis: Secondary | ICD-10-CM | POA: Diagnosis not present

## 2014-12-11 DIAGNOSIS — I10 Essential (primary) hypertension: Secondary | ICD-10-CM | POA: Diagnosis not present

## 2015-01-01 ENCOUNTER — Emergency Department (HOSPITAL_COMMUNITY): Payer: Medicare HMO

## 2015-01-01 ENCOUNTER — Encounter (HOSPITAL_COMMUNITY): Payer: Self-pay | Admitting: Emergency Medicine

## 2015-01-01 ENCOUNTER — Emergency Department (HOSPITAL_COMMUNITY)
Admission: EM | Admit: 2015-01-01 | Discharge: 2015-01-01 | Disposition: A | Discharge status: Home / Self Care | Payer: Medicare HMO | Attending: Emergency Medicine | Admitting: Emergency Medicine

## 2015-01-01 DIAGNOSIS — Y998 Other external cause status: Secondary | ICD-10-CM | POA: Insufficient documentation

## 2015-01-01 DIAGNOSIS — F329 Major depressive disorder, single episode, unspecified: Secondary | ICD-10-CM | POA: Diagnosis not present

## 2015-01-01 DIAGNOSIS — E780 Pure hypercholesterolemia: Secondary | ICD-10-CM | POA: Diagnosis not present

## 2015-01-01 DIAGNOSIS — Y929 Unspecified place or not applicable: Secondary | ICD-10-CM | POA: Insufficient documentation

## 2015-01-01 DIAGNOSIS — R0781 Pleurodynia: Secondary | ICD-10-CM

## 2015-01-01 DIAGNOSIS — E119 Type 2 diabetes mellitus without complications: Secondary | ICD-10-CM | POA: Insufficient documentation

## 2015-01-01 DIAGNOSIS — S29001A Unspecified injury of muscle and tendon of front wall of thorax, initial encounter: Secondary | ICD-10-CM | POA: Diagnosis present

## 2015-01-01 DIAGNOSIS — Z79899 Other long term (current) drug therapy: Secondary | ICD-10-CM | POA: Diagnosis not present

## 2015-01-01 DIAGNOSIS — Z87448 Personal history of other diseases of urinary system: Secondary | ICD-10-CM | POA: Insufficient documentation

## 2015-01-01 DIAGNOSIS — M1990 Unspecified osteoarthritis, unspecified site: Secondary | ICD-10-CM | POA: Insufficient documentation

## 2015-01-01 DIAGNOSIS — X58XXXA Exposure to other specified factors, initial encounter: Secondary | ICD-10-CM | POA: Insufficient documentation

## 2015-01-01 DIAGNOSIS — K219 Gastro-esophageal reflux disease without esophagitis: Secondary | ICD-10-CM | POA: Insufficient documentation

## 2015-01-01 DIAGNOSIS — Y9389 Activity, other specified: Secondary | ICD-10-CM | POA: Diagnosis not present

## 2015-01-01 DIAGNOSIS — I10 Essential (primary) hypertension: Secondary | ICD-10-CM | POA: Insufficient documentation

## 2015-01-01 MED ORDER — HYDROCODONE-ACETAMINOPHEN 5-325 MG PO TABS: 1.0000 | ORAL_TABLET | Freq: Four times a day (QID) | ORAL | Status: DC | PRN

## 2015-01-01 NOTE — ED Notes (Signed)
Pt reports she leaned over washer and felt something crack. Right ribs are tender to palpation and movement. Said she heard pop. This was Saturday. Also hurts with deep inspiration.

## 2015-01-01 NOTE — ED Provider Notes (Signed)
CSN: [id number]     Arrival date & time 01/01/15  1150 History  This chart was scribed for non-physician practitioner, Margarita Mail, PA-C, working with Dorie Rank, MD by Ladene Artist, ED Scribe. This patient was seen in room TR07C/TR07C and the patient's care was started at 1:22 PM.   Chief Complaint  Patient presents with  . Chest Injury   The history is provided by the patient. No language interpreter was used.   HPI Comments: Chloe Harding is a 69 y.o. female, with a h/o DM and HTN, who presents to the Emergency Department complaining of persistent right rib pain onset 2 days ago. Pt states that she leaned over a washer 2 days ago and felt a crack in her right ribs. She reports constant pain that is exacerbated with deep inspiration, movement and palpation. No treatments tried PTA. Pt denies productive cough. She is R hand dominant. Pt is a nonsmoker. No h/o osteoporosis. Pt has not had to take Prednisone for extended periods of time.   Past Medical History  Diagnosis Date  . Hypertension   . Diabetes mellitus   . Renal disorder     shutting down 4 years ago  . Arthritis   . Depression   . Reflux   . Hiatal hernia   . Hypercholesteremia    Past Surgical History  Procedure Laterality Date  . Cholecystectomy     History reviewed. No pertinent family history. Social History  Substance Use Topics  . Smoking status: Never Smoker   . Smokeless tobacco: None  . Alcohol Use: No   OB History    No data available     Review of Systems  Musculoskeletal:       + Rib pain   Allergies  Amlodipine besylate  Home Medications   Prior to Admission medications   Medication Sig Start Date End Date Taking? Authorizing Provider  carvedilol (COREG) 12.5 MG tablet Take 12.5 mg by mouth 2 (two) times daily with a meal.    Historical Provider, MD  Chlorpheniramine-DM (CORICIDIN COUGH/COLD) 4-30 MG TABS Take 2 capsules by mouth every 4 (four) hours as needed (coughing).    Historical  Provider, MD  citalopram (CELEXA) 40 MG tablet Take 40 mg by mouth daily.    Historical Provider, MD  diltiazem (CARDIZEM) 30 MG tablet Take 30-60 mg by mouth 2 (two) times daily. 60 mg in the morning and 30mg  in the evening    Historical Provider, MD  gabapentin (NEURONTIN) 600 MG tablet Take 600-1,200 mg by mouth 2 (two) times daily. 1 cap in the morning and 2 cap at night    Historical Provider, MD  gemfibrozil (LOPID) 600 MG tablet Take 600 mg by mouth 2 (two) times daily before a meal.    Historical Provider, MD  glipiZIDE (GLUCOTROL) 5 MG tablet Take 5 mg by mouth 2 (two) times daily before a meal.    Historical Provider, MD  HYDROcodone-acetaminophen (NORCO/VICODIN) 5-325 MG per tablet Take 1 tablet by mouth every 6 (six) hours as needed for severe pain. 04/07/14   Carmin Muskrat, MD  lisinopril-hydrochlorothiazide (PRINZIDE,ZESTORETIC) 20-12.5 MG per tablet Take 1 tablet by mouth 2 (two) times daily.     Historical Provider, MD  omeprazole (PRILOSEC) 20 MG capsule Take 20 mg by mouth 2 (two) times daily.     Historical Provider, MD  pravastatin (PRAVACHOL) 20 MG tablet Take 20 mg by mouth at bedtime.    Historical Provider, MD  rOPINIRole (REQUIP) 0.5 MG tablet  Take 0.5-1.5 mg by mouth at bedtime.    Historical Provider, MD  vitamin B-12 (CYANOCOBALAMIN) 250 MCG tablet Take 250 mcg by mouth daily.    Historical Provider, MD   BP 118/55 mmHg  Pulse 74  Temp(Src) 98.4 F (36.9 C) (Oral)  Resp 16  Ht 5\' 2"  (1.575 m)  Wt 174 lb (78.926 kg)  BMI 31.82 kg/m2  SpO2 97% Physical Exam  Constitutional: She is oriented to person, place, and time. She appears well-developed and well-nourished. No distress.  HENT:  Head: Normocephalic and atraumatic.  Eyes: Conjunctivae and EOM are normal.  Neck: Neck supple. No tracheal deviation present.  Cardiovascular: Normal rate.   Pulmonary/Chest: Effort normal. No respiratory distress.  Breathing is shallow. Good lung sounds.   Musculoskeletal:  Normal range of motion.  Exquisite bony tenderness over the mid axillary line on the R. No bruising or swelling.   Neurological: She is alert and oriented to person, place, and time.  Skin: Skin is warm and dry.  Psychiatric: She has a normal mood and affect. Her behavior is normal.  Nursing note and vitals reviewed.  ED Course  Procedures (including critical care time) DIAGNOSTIC STUDIES: Oxygen Saturation is 97% on RA, normal by my interpretation.    COORDINATION OF CARE: 1:29 PM-Discussed treatment plan which includes XR and Norco with pt at bedside and pt agreed to plan.   Labs Review Labs Reviewed - No data to display  Imaging Review Dg Ribs Unilateral W/chest Right  01/01/2015   CLINICAL DATA:  Pain for 2 days.  No history of trauma.  EXAM: RIGHT RIBS AND CHEST - 3+ VIEW  COMPARISON:  Chest radiograph September 06, 2013  FINDINGS: Frontal chest as well as oblique and cone-down lower rib images were obtained. There is stable elevation the right hemidiaphragm. There is mild scarring in the bases. There is no edema or consolidation. Heart size and pulmonary vascularity are normal. There is atherosclerotic change in the aorta.  There is no appreciable pneumothorax or effusion. There is evidence of an old fracture of the anterior right fourth rib with remodeling. No acute fracture evident.  IMPRESSION: No acute fracture evident. Evidence of remodeling in the anterior right fourth rib consistent with prior fracture in this area. Stable elevation right hemidiaphragm. Scarring in the bases bilaterally. No pneumothorax.   Electronically Signed   By: Lowella Grip III M.D.   On: 01/01/2015 13:16   I have personally reviewed and evaluated these images and lab results as part of my medical decision-making.   EKG Interpretation None      MDM   Final diagnoses:  Rib pain on right side    Patient. Chest x-ray does not show any fracture of the rib cage. Patient is able to breathe and oxygen  saturations above 90%. She has a risk factor for pathologic fracture. Patient will be discharged with pain medication. Discussed need for deep breathing through her rib discomfort. Patient given pain medications. Follow up with her primary care for provider. His signs of rash or infection. No signs of herpetic eruption.  I personally performed the services described in this documentation, which was scribed in my presence. The recorded information has been reviewed and is accurate.      Margarita Mail, PA-C 01/01/15 [place], MD 01/02/15 1134

## 2015-01-01 NOTE — ED Notes (Signed)
Pt in xray

## 2015-01-01 NOTE — Discharge Instructions (Signed)
Rib Contusion °A rib contusion (bruise) can occur by a blow to the chest or by a fall against a hard object. Usually these will be much better in a couple weeks. If X-rays were taken today and there are no broken bones (fractures), the diagnosis of bruising is made. However, broken ribs may not show up for several days, or may be discovered later on a routine X-ray when signs of healing show up. If this happens to you, it does not mean that something was missed on the X-ray, but simply that it did not show up on the first X-rays. Earlier diagnosis will not usually change the treatment. °HOME CARE INSTRUCTIONS  °· Avoid strenuous activity. Be careful during activities and avoid bumping the injured ribs. Activities that pull on the injured ribs and cause pain should be avoided, if possible. °· For the first day or two, an ice pack used every 20 minutes while awake may be helpful. Put ice in a plastic bag and put a towel between the bag and the skin. °· Eat a normal, well-balanced diet. Drink plenty of fluids to avoid constipation. °· Take deep breaths several times a day to keep lungs free of infection. Try to cough several times a day. Splint the injured area with a pillow while coughing to ease pain. Coughing can help prevent pneumonia. °· Wear a rib belt or binder only if told to do so by your caregiver. If you are wearing a rib belt or binder, you must do the breathing exercises as directed by your caregiver. If not used properly, rib belts or binders restrict breathing which can lead to pneumonia. °· Only take over-the-counter or prescription medicines for pain, discomfort, or fever as directed by your caregiver. °SEEK MEDICAL CARE IF:  °· You or your child has an oral temperature above 102° F (38.9° C). °· Your baby is older than 3 months with a rectal temperature of 100.5° F (38.1° C) or higher for more than 1 day. °· You develop a cough, with thick or bloody sputum. °SEEK IMMEDIATE MEDICAL CARE IF:  °· You  have difficulty breathing. °· You feel sick to your stomach (nausea), have vomiting or belly (abdominal) pain. °· You have worsening pain, not controlled with medications, or there is a change in the location of the pain. °· You develop sweating or radiation of the pain into the arms, jaw or shoulders, or become light headed or faint. °· You or your child has an oral temperature above 102° F (38.9° C), not controlled by medicine. °· Your or your baby is older than 3 months with a rectal temperature of 102° F (38.9° C) or higher. °· Your baby is 3 months old or younger with a rectal temperature of 100.4° F (38° C) or higher. °MAKE SURE YOU:  °· Understand these instructions. °· Will watch your condition. °· Will get help right away if you are not doing well or get worse. °Document Released: 01/27/2001 Document Revised: 08/29/2012 Document Reviewed: 12/21/2007 °[employer]® Patient Information ©2015 [employer]. This information is not intended to replace advice given to you by your health care provider. Make sure you discuss any questions you have with your health care provider. ° °

## 2015-03-30 ENCOUNTER — Inpatient Hospital Stay (HOSPITAL_COMMUNITY): Payer: Medicare HMO

## 2015-03-30 ENCOUNTER — Inpatient Hospital Stay (HOSPITAL_COMMUNITY)
Admission: EM | Admit: 2015-03-30 | Discharge: 2015-04-02 | DRG: 871 | Disposition: A | Discharge status: Home / Self Care | Payer: Medicare HMO | Attending: Internal Medicine | Admitting: Internal Medicine

## 2015-03-30 ENCOUNTER — Emergency Department (HOSPITAL_COMMUNITY): Payer: Medicare HMO

## 2015-03-30 ENCOUNTER — Encounter (HOSPITAL_COMMUNITY): Payer: Self-pay | Admitting: Emergency Medicine

## 2015-03-30 DIAGNOSIS — N189 Chronic kidney disease, unspecified: Secondary | ICD-10-CM | POA: Diagnosis present

## 2015-03-30 DIAGNOSIS — E1142 Type 2 diabetes mellitus with diabetic polyneuropathy: Secondary | ICD-10-CM

## 2015-03-30 DIAGNOSIS — I129 Hypertensive chronic kidney disease with stage 1 through stage 4 chronic kidney disease, or unspecified chronic kidney disease: Secondary | ICD-10-CM | POA: Diagnosis present

## 2015-03-30 DIAGNOSIS — K219 Gastro-esophageal reflux disease without esophagitis: Secondary | ICD-10-CM | POA: Diagnosis present

## 2015-03-30 DIAGNOSIS — Z7984 Long term (current) use of oral hypoglycemic drugs: Secondary | ICD-10-CM

## 2015-03-30 DIAGNOSIS — M1990 Unspecified osteoarthritis, unspecified site: Secondary | ICD-10-CM | POA: Diagnosis present

## 2015-03-30 DIAGNOSIS — G9340 Encephalopathy, unspecified: Secondary | ICD-10-CM | POA: Diagnosis not present

## 2015-03-30 DIAGNOSIS — I959 Hypotension, unspecified: Secondary | ICD-10-CM | POA: Diagnosis present

## 2015-03-30 DIAGNOSIS — R4182 Altered mental status, unspecified: Secondary | ICD-10-CM | POA: Diagnosis present

## 2015-03-30 DIAGNOSIS — J189 Pneumonia, unspecified organism: Secondary | ICD-10-CM | POA: Diagnosis not present

## 2015-03-30 DIAGNOSIS — E785 Hyperlipidemia, unspecified: Secondary | ICD-10-CM | POA: Diagnosis present

## 2015-03-30 DIAGNOSIS — E1122 Type 2 diabetes mellitus with diabetic chronic kidney disease: Secondary | ICD-10-CM | POA: Diagnosis present

## 2015-03-30 DIAGNOSIS — A419 Sepsis, unspecified organism: Principal | ICD-10-CM | POA: Diagnosis present

## 2015-03-30 DIAGNOSIS — N289 Disorder of kidney and ureter, unspecified: Secondary | ICD-10-CM | POA: Diagnosis present

## 2015-03-30 DIAGNOSIS — J690 Pneumonitis due to inhalation of food and vomit: Secondary | ICD-10-CM | POA: Diagnosis present

## 2015-03-30 DIAGNOSIS — E1149 Type 2 diabetes mellitus with other diabetic neurological complication: Secondary | ICD-10-CM

## 2015-03-30 DIAGNOSIS — E875 Hyperkalemia: Secondary | ICD-10-CM | POA: Diagnosis present

## 2015-03-30 DIAGNOSIS — E1165 Type 2 diabetes mellitus with hyperglycemia: Secondary | ICD-10-CM | POA: Diagnosis present

## 2015-03-30 DIAGNOSIS — E119 Type 2 diabetes mellitus without complications: Secondary | ICD-10-CM

## 2015-03-30 DIAGNOSIS — E1140 Type 2 diabetes mellitus with diabetic neuropathy, unspecified: Secondary | ICD-10-CM | POA: Diagnosis present

## 2015-03-30 DIAGNOSIS — E7800 Pure hypercholesterolemia, unspecified: Secondary | ICD-10-CM | POA: Diagnosis present

## 2015-03-30 DIAGNOSIS — N179 Acute kidney failure, unspecified: Secondary | ICD-10-CM | POA: Diagnosis present

## 2015-03-30 DIAGNOSIS — R112 Nausea with vomiting, unspecified: Secondary | ICD-10-CM | POA: Diagnosis present

## 2015-03-30 LAB — BLOOD GAS, ARTERIAL
ACID-BASE DEFICIT: 7.5 mmol/L — AB (ref 0.0–2.0)
Bicarbonate: 17.3 mEq/L — ABNORMAL LOW (ref 20.0–24.0)
DRAWN BY: 422461
O2 Content: 2 L/min
O2 Saturation: 97.2 %
PCO2 ART: 34.3 mmHg — AB (ref 35.0–45.0)
PH ART: 7.324 — AB (ref 7.350–7.450)
PO2 ART: 102 mmHg — AB (ref 80.0–100.0)
Patient temperature: 98.6
TCO2: 16.7 mmol/L (ref 0–100)

## 2015-03-30 LAB — URINALYSIS, ROUTINE W REFLEX MICROSCOPIC
BILIRUBIN URINE: NEGATIVE
GLUCOSE, UA: NEGATIVE mg/dL
HGB URINE DIPSTICK: NEGATIVE
Ketones, ur: NEGATIVE mg/dL
Nitrite: NEGATIVE
PROTEIN: NEGATIVE mg/dL
Specific Gravity, Urine: 1.017 (ref 1.005–1.030)
Urobilinogen, UA: 1 mg/dL (ref 0.0–1.0)
pH: 7 (ref 5.0–8.0)

## 2015-03-30 LAB — COMPREHENSIVE METABOLIC PANEL
ALT: 12 U/L — AB (ref 14–54)
ALT: 12 U/L — ABNORMAL LOW (ref 14–54)
ANION GAP: 8 (ref 5–15)
AST: 18 U/L (ref 15–41)
AST: 15 U/L (ref 15–41)
Albumin: 3.4 g/dL — ABNORMAL LOW (ref 3.5–5.0)
Albumin: 2.7 g/dL — ABNORMAL LOW (ref 3.5–5.0)
Alkaline Phosphatase: 82 U/L (ref 38–126)
Alkaline Phosphatase: 67 U/L (ref 38–126)
Anion gap: 7 (ref 5–15)
BILIRUBIN TOTAL: 0.6 mg/dL (ref 0.3–1.2)
BUN: 40 mg/dL — AB (ref 6–20)
BUN: 39 mg/dL — AB (ref 6–20)
CHLORIDE: 107 mmol/L (ref 101–111)
CO2: 22 mmol/L (ref 22–32)
CO2: 21 mmol/L — ABNORMAL LOW (ref 22–32)
CREATININE: 1.39 mg/dL — AB (ref 0.44–1.00)
Calcium: 9.4 mg/dL (ref 8.9–10.3)
Calcium: 8.6 mg/dL — ABNORMAL LOW (ref 8.9–10.3)
Chloride: 110 mmol/L (ref 101–111)
Creatinine, Ser: 1.72 mg/dL — ABNORMAL HIGH (ref 0.44–1.00)
GFR calc non Af Amer: 29 mL/min — ABNORMAL LOW (ref >60)
GFR, EST AFRICAN AMERICAN: 34 mL/min — AB (ref >60)
GFR, EST AFRICAN AMERICAN: 44 mL/min — AB (ref >60)
GFR, EST NON AFRICAN AMERICAN: 38 mL/min — AB (ref >60)
Glucose, Bld: 156 mg/dL — ABNORMAL HIGH (ref 65–99)
Glucose, Bld: 105 mg/dL — ABNORMAL HIGH (ref 65–99)
POTASSIUM: 5.4 mmol/L — AB (ref 3.5–5.1)
Potassium: 6.1 mmol/L (ref 3.5–5.1)
Sodium: 137 mmol/L (ref 135–145)
Sodium: 138 mmol/L (ref 135–145)
TOTAL PROTEIN: 6.8 g/dL (ref 6.5–8.1)
TOTAL PROTEIN: 5.6 g/dL — AB (ref 6.5–8.1)
Total Bilirubin: 1.1 mg/dL (ref 0.3–1.2)

## 2015-03-30 LAB — LACTIC ACID, PLASMA
LACTIC ACID, VENOUS: 1.5 mmol/L (ref 0.5–2.0)
LACTIC ACID, VENOUS: 0.8 mmol/L (ref 0.5–2.0)

## 2015-03-30 LAB — CBC WITH DIFFERENTIAL/PLATELET
BASOS ABS: 0 10*3/uL (ref 0.0–0.1)
Basophils Relative: 0 %
EOS PCT: 0 %
Eosinophils Absolute: 0 10*3/uL (ref 0.0–0.7)
HEMATOCRIT: 27.4 % — AB (ref 36.0–46.0)
Hemoglobin: 9.3 g/dL — ABNORMAL LOW (ref 12.0–15.0)
LYMPHS ABS: 2.9 10*3/uL (ref 0.7–4.0)
LYMPHS PCT: 27 %
MCH: 31.2 pg (ref 26.0–34.0)
MCHC: 33.9 g/dL (ref 30.0–36.0)
MCV: 91.9 fL (ref 78.0–100.0)
MONO ABS: 0.5 10*3/uL (ref 0.1–1.0)
MONOS PCT: 5 %
NEUTROS ABS: 7.3 10*3/uL (ref 1.7–7.7)
Neutrophils Relative %: 68 %
Platelets: 137 10*3/uL — ABNORMAL LOW (ref 150–400)
RBC: 2.98 MIL/uL — ABNORMAL LOW (ref 3.87–5.11)
RDW: 13.8 % (ref 11.5–15.5)
WBC: 10.7 10*3/uL — ABNORMAL HIGH (ref 4.0–10.5)

## 2015-03-30 LAB — CBC
HCT: 32.4 % — ABNORMAL LOW (ref 36.0–46.0)
Hemoglobin: 11.1 g/dL — ABNORMAL LOW (ref 12.0–15.0)
MCH: 31.4 pg (ref 26.0–34.0)
MCHC: 34.3 g/dL (ref 30.0–36.0)
MCV: 91.5 fL (ref 78.0–100.0)
PLATELETS: 224 10*3/uL (ref 150–400)
RBC: 3.54 MIL/uL — ABNORMAL LOW (ref 3.87–5.11)
RDW: 13.6 % (ref 11.5–15.5)
WBC: 14.7 10*3/uL — ABNORMAL HIGH (ref 4.0–10.5)

## 2015-03-30 LAB — URINE MICROSCOPIC-ADD ON

## 2015-03-30 LAB — PROTIME-INR
INR: 1.17 (ref 0.00–1.49)
PROTHROMBIN TIME: 15.1 s (ref 11.6–15.2)

## 2015-03-30 LAB — APTT: aPTT: 31 seconds (ref 24–37)

## 2015-03-30 LAB — CBG MONITORING, ED
GLUCOSE-CAPILLARY: 159 mg/dL — AB (ref 65–99)
Glucose-Capillary: 129 mg/dL — ABNORMAL HIGH (ref 65–99)

## 2015-03-30 LAB — GLUCOSE, CAPILLARY: Glucose-Capillary: 87 mg/dL (ref 65–99)

## 2015-03-30 LAB — I-STAT CG4 LACTIC ACID, ED: Lactic Acid, Venous: 1.82 mmol/L (ref 0.5–2.0)

## 2015-03-30 LAB — PROCALCITONIN: Procalcitonin: 4.19 ng/mL

## 2015-03-30 LAB — TSH: TSH: 1.191 u[IU]/mL (ref 0.350–4.500)

## 2015-03-30 LAB — MRSA PCR SCREENING: MRSA BY PCR: NEGATIVE

## 2015-03-30 MED ORDER — DEXTROSE 5 % IV SOLN
500.0000 mg | Freq: Once | INTRAVENOUS | Status: AC
  Administered 2015-03-30: 500 mg via INTRAVENOUS
  Filled 2015-03-30: qty 500

## 2015-03-30 MED ORDER — GABAPENTIN 300 MG PO CAPS
600.0000 mg | ORAL_CAPSULE | Freq: Every day | ORAL | Status: DC
  Administered 2015-03-31 – 2015-04-02 (×3): 600 mg via ORAL
  Filled 2015-03-30 (×3): qty 2

## 2015-03-30 MED ORDER — ACETAMINOPHEN 325 MG PO TABS
650.0000 mg | ORAL_TABLET | Freq: Once | ORAL | Status: AC
  Administered 2015-03-30: 650 mg via ORAL
  Filled 2015-03-30: qty 2

## 2015-03-30 MED ORDER — ONDANSETRON HCL 4 MG/2ML IJ SOLN: 4.0000 mg | Freq: Four times a day (QID) | INTRAMUSCULAR | Status: DC | PRN

## 2015-03-30 MED ORDER — ACETAMINOPHEN 325 MG PO TABS
650.0000 mg | ORAL_TABLET | Freq: Four times a day (QID) | ORAL | Status: DC | PRN
  Administered 2015-03-31: 650 mg via ORAL
  Filled 2015-03-30: qty 2

## 2015-03-30 MED ORDER — CITALOPRAM HYDROBROMIDE 20 MG PO TABS
40.0000 mg | ORAL_TABLET | Freq: Every day | ORAL | Status: DC
  Administered 2015-03-31 – 2015-04-02 (×3): 40 mg via ORAL
  Filled 2015-03-30 (×3): qty 2

## 2015-03-30 MED ORDER — GABAPENTIN 400 MG PO CAPS
1200.0000 mg | ORAL_CAPSULE | Freq: Every day | ORAL | Status: DC
  Administered 2015-03-30 – 2015-04-01 (×3): 1200 mg via ORAL
  Filled 2015-03-30 (×3): qty 3

## 2015-03-30 MED ORDER — PIPERACILLIN-TAZOBACTAM 3.375 G IVPB
3.3750 g | Freq: Three times a day (TID) | INTRAVENOUS | Status: DC
  Administered 2015-03-30 – 2015-04-01 (×5): 3.375 g via INTRAVENOUS
  Filled 2015-03-30 (×5): qty 50

## 2015-03-30 MED ORDER — SODIUM CHLORIDE 0.9 % IV BOLUS (SEPSIS)
1000.0000 mL | Freq: Once | INTRAVENOUS | Status: AC
  Administered 2015-03-30: 1000 mL via INTRAVENOUS

## 2015-03-30 MED ORDER — SODIUM CHLORIDE 0.9 % IV SOLN
INTRAVENOUS | Status: DC
  Administered 2015-03-31: 02:00:00 via INTRAVENOUS

## 2015-03-30 MED ORDER — DEXTROSE 5 % IV SOLN: 500.0000 mg | INTRAVENOUS | Status: DC

## 2015-03-30 MED ORDER — GABAPENTIN 400 MG PO CAPS: 1200.0000 mg | ORAL_CAPSULE | Freq: Every day | ORAL | Status: DC

## 2015-03-30 MED ORDER — DEXTROSE 5 % IV SOLN: 1.0000 g | INTRAVENOUS | Status: DC

## 2015-03-30 MED ORDER — SODIUM CHLORIDE 0.9 % IV SOLN
1250.0000 mg | Freq: Once | INTRAVENOUS | Status: AC
  Administered 2015-03-30: 1250 mg via INTRAVENOUS
  Filled 2015-03-30: qty 1250

## 2015-03-30 MED ORDER — SODIUM CHLORIDE 0.9 % IJ SOLN
3.0000 mL | Freq: Two times a day (BID) | INTRAMUSCULAR | Status: DC
  Administered 2015-03-30 – 2015-04-02 (×6): 3 mL via INTRAVENOUS

## 2015-03-30 MED ORDER — VANCOMYCIN HCL IN DEXTROSE 1-5 GM/200ML-% IV SOLN
1000.0000 mg | INTRAVENOUS | Status: DC
  Administered 2015-03-31: 1000 mg via INTRAVENOUS
  Filled 2015-03-30 (×2): qty 200

## 2015-03-30 MED ORDER — SODIUM CHLORIDE 0.9 % IV SOLN
1.0000 g | Freq: Once | INTRAVENOUS | Status: AC
  Administered 2015-03-30: 1 g via INTRAVENOUS
  Filled 2015-03-30: qty 10

## 2015-03-30 MED ORDER — HEPARIN SODIUM (PORCINE) 5000 UNIT/ML IJ SOLN
5000.0000 [IU] | Freq: Three times a day (TID) | INTRAMUSCULAR | Status: DC
  Administered 2015-03-30 – 2015-04-01 (×5): 5000 [IU] via SUBCUTANEOUS
  Filled 2015-03-30 (×7): qty 1

## 2015-03-30 MED ORDER — CEFTRIAXONE SODIUM 1 G IJ SOLR
1.0000 g | Freq: Once | INTRAMUSCULAR | Status: AC
  Administered 2015-03-30: 1 g via INTRAVENOUS
  Filled 2015-03-30: qty 10

## 2015-03-30 MED ORDER — PREGABALIN 75 MG PO CAPS
75.0000 mg | ORAL_CAPSULE | Freq: Three times a day (TID) | ORAL | Status: DC
  Administered 2015-03-31 – 2015-04-02 (×7): 75 mg via ORAL
  Filled 2015-03-30 (×7): qty 1

## 2015-03-30 MED ORDER — PANTOPRAZOLE SODIUM 40 MG PO TBEC
40.0000 mg | DELAYED_RELEASE_TABLET | Freq: Every day | ORAL | Status: DC
  Administered 2015-03-31 – 2015-04-02 (×3): 40 mg via ORAL
  Filled 2015-03-30 (×3): qty 1

## 2015-03-30 MED ORDER — ALUM & MAG HYDROXIDE-SIMETH 200-200-20 MG/5ML PO SUSP: 30.0000 mL | Freq: Four times a day (QID) | ORAL | Status: DC | PRN

## 2015-03-30 MED ORDER — ONDANSETRON HCL 4 MG PO TABS: 4.0000 mg | ORAL_TABLET | Freq: Four times a day (QID) | ORAL | Status: DC | PRN

## 2015-03-30 MED ORDER — ROPINIROLE HCL 1 MG PO TABS
2.0000 mg | ORAL_TABLET | Freq: Every day | ORAL | Status: DC
  Administered 2015-03-31 – 2015-04-01 (×2): 2 mg via ORAL
  Filled 2015-03-30 (×4): qty 2

## 2015-03-30 MED ORDER — ACETAMINOPHEN 650 MG RE SUPP: 650.0000 mg | Freq: Four times a day (QID) | RECTAL | Status: DC | PRN

## 2015-03-30 MED ORDER — PRAVASTATIN SODIUM 20 MG PO TABS
20.0000 mg | ORAL_TABLET | Freq: Every day | ORAL | Status: DC
  Administered 2015-03-30 – 2015-04-01 (×3): 20 mg via ORAL
  Filled 2015-03-30 (×4): qty 1

## 2015-03-30 NOTE — ED Notes (Signed)
Awake. Verbally responsive. A/O x4. Resp even and unlabored. No audible adventitious breath sounds noted. ABC's intact. SR on monitor. IV infused  Rocephin without adverse effects noted.

## 2015-03-30 NOTE — H&P (Signed)
[facility] History and Physical  Patient: Chloe Harding  MRN: [id number]  DOB: 1946-04-04  DOS: the patient was seen and examined on 03/30/2015 PCP: Aldona Bar, MD  Referring physician: Dr. Verta Ellen Chief Complaint: High sugar  HPI: Chloe Harding is a 69 y.o. female with Past medical history of hypertension, diabetes mellitus type 2, diabetic neuropathy, chronic kidney disease,mood disorder. Patient presents with complaints of high blood sugar. History was taken with the help of patient's husband as the patient is unable to provide significant history due to drowsiness. At her baseline the patient is fairly communicative and active and independent. Patient has been drowsy since she has been in the hospital. Patient had episodes of nausea followed by multiple episodes of vomiting last night. In the morning they found that her sugars were running higher in the range of 260. Discussed with her sister-in-law who gave her some sugar tablets. After that the patient was found to be having significantly low blood sugar. Therefore the patient was brought to the hospital. At the time of my evaluation the patient was drowsy although was easily awakened and was able to deny having any complaints of chest pain, abdominal pain, nausea, vomiting. She did not have any fever or chills at home. She did not have any constipation fall or trauma. Husband reports no changes in her medication currently. Husband also reports the patient has been compliant with all her medications.  The patient is coming from home  At her baseline ambulates without support And is independent for most of her ADL; manages her medication on her own.  Review of Systems: as mentioned in the history of present illness.  A comprehensive review of the other systems is negative.  Past Medical History  Diagnosis Date  . Hypertension   . Diabetes mellitus   . Renal disorder     shutting down 4 years ago  .  Arthritis   . Depression   . Reflux   . Hiatal hernia   . Hypercholesteremia    Past Surgical History  Procedure Laterality Date  . Cholecystectomy     Social History:  reports that she has never smoked. She does not have any smokeless tobacco history on file. She reports that she does not drink alcohol or use illicit drugs.  Allergies  Allergen Reactions  . Amlodipine Besylate     REACTION: swelling    No family history on file.  Prior to Admission medications   Medication Sig Start Date End Date Taking? Authorizing Provider  carvedilol (COREG) 12.5 MG tablet Take 12.5 mg by mouth 2 (two) times daily with a meal.   Yes Historical Provider, MD  citalopram (CELEXA) 40 MG tablet Take 40 mg by mouth daily.   Yes Historical Provider, MD  dexlansoprazole (DEXILANT) 60 MG capsule Take 60 mg by mouth daily.   Yes Historical Provider, MD  diltiazem (CARDIZEM) 30 MG tablet Take 30-60 mg by mouth 2 (two) times daily. 60 mg in the morning and 30mg  in the evening   Yes Historical Provider, MD  gabapentin (NEURONTIN) 600 MG tablet Take 600-1,200 mg by mouth 2 (two) times daily. 1 cap in the morning and 2 cap at night   Yes Historical Provider, MD  gemfibrozil (LOPID) 600 MG tablet Take 600 mg by mouth 2 (two) times daily before a meal.   Yes Historical Provider, MD  glipiZIDE (GLUCOTROL) 5 MG tablet Take 5 mg by mouth 2 (two) times daily before a meal.   Yes Historical  Provider, MD  HYDROcodone-acetaminophen (NORCO/VICODIN) 5-325 MG per tablet Take 1 tablet by mouth every 6 (six) hours as needed for severe pain. 01/01/15  Yes Margarita Mail, PA-C  lisinopril-hydrochlorothiazide (PRINZIDE,ZESTORETIC) 20-12.5 MG per tablet Take 1 tablet by mouth 2 (two) times daily.    Yes Historical Provider, MD  meloxicam (MOBIC) 15 MG tablet Take 15 mg by mouth daily.   Yes Historical Provider, MD  pravastatin (PRAVACHOL) 20 MG tablet Take 20 mg by mouth at bedtime.   Yes Historical Provider, MD  pregabalin  (LYRICA) 75 MG capsule Take 75 mg by mouth 3 (three) times daily.   Yes Historical Provider, MD  rOPINIRole (REQUIP) 2 MG tablet Take 2 mg by mouth at bedtime.   Yes Historical Provider, MD  traMADol (ULTRAM) 50 MG tablet Take 50 mg by mouth every 6 (six) hours as needed for moderate pain.   Yes Historical Provider, MD    Physical Exam: Filed Vitals:   03/30/15 1630 03/30/15 1645 03/30/15 1700 03/30/15 1715  BP: 106/45 100/49 96/45 93/45   Pulse: 77 76 73 76  Temp:   99.5 F (37.5 C)   TempSrc:   Oral   Resp: 18 16 16 16   SpO2: 96% 95% 95% 94%    General: drowsy but easily awakened and Oriented to Time, Place and Person. Appear in mild distress Eyes: PERRL ENT: Oral Mucosa clear moist Neck: no JVD Cardiovascular: S1 and S2 Present, no Murmur, Peripheral Pulses Present Respiratory: Bilateral Air entry equal and Decreased,  Faint basal Crackles, no wheezes Abdomen: Bowel Sound present, Soft and no tenderness Skin: no Rash Extremities: no Pedal edema, no calf tenderness Neurologic: Grossly no focal neuro deficit, other than lethargy and drowsiness  Labs on Admission:  CBC:  Recent Labs Lab 03/30/15 1458  WBC 14.7*  HGB 11.1*  HCT 32.4*  MCV 91.5  PLT 224    CMP     Component Value Date/Time   NA 137 03/30/2015 1458   K 6.1* 03/30/2015 1458   CL 107 03/30/2015 1458   CO2 22 03/30/2015 1458   GLUCOSE 156* 03/30/2015 1458   BUN 40* 03/30/2015 1458   CREATININE 1.72* 03/30/2015 1458   CALCIUM 9.4 03/30/2015 1458   CALCIUM 11.0* 03/06/2008 0603   PROT 6.8 03/30/2015 1458   ALBUMIN 3.4* 03/30/2015 1458   AST 18 03/30/2015 1458   ALT 12* 03/30/2015 1458   ALKPHOS 82 03/30/2015 1458   BILITOT 1.1 03/30/2015 1458   GFRNONAA 29* 03/30/2015 1458   GFRAA 34* 03/30/2015 1458    No results for input(s): CKTOTAL, CKMB, CKMBINDEX, TROPONINI in the last 168 hours. BNP (last 3 results) No results for input(s): BNP in the last 8760 hours.  ProBNP (last 3 results) No  results for input(s): PROBNP in the last 8760 hours.   Radiological Exams on Admission: Dg Chest Port 1 View  03/30/2015  CLINICAL DATA:  Hypotension and weakness. EXAM: PORTABLE CHEST 1 VIEW COMPARISON:  01/01/2015 and 09/06/2013 FINDINGS: Patient rotated to the left. Lungs are hypoinflated with stable elevation of the right hemidiaphragm. New airspace process over the right lung suggesting a pneumonia with associated linear atelectasis in the right base. Cardiomediastinal silhouette is within normal. There is calcified plaque over the thoracic aorta. There are mild degenerative changes of the spine. IMPRESSION: Linear atelectasis right base. New airspace process over the right midlung likely a pneumonia. Electronically Signed   By: Marin Olp M.D.   On: 03/30/2015 16:05   EKG: Independently reviewed. normal sinus  rhythm, nonspecific ST and T waves changes.  Assessment/Plan 1. Sepsis ([place]) The patient is presenting with complaints of hypotension, altered mental status, mild increase in temperature, leukocytosis with evidence of infection in the chest x-ray. This is suspicious for community acquired pneumonia causing sepsis. The patient is currently being treated with ceftriaxone and azithromycin. We will recheck all the lab work. Follow the blood culture and sputum culture. Follow urine antigens. Monitoring step down unit. Continue to IV hydrate her.  2.acute encephalopathy. Most likely in the setting of sepsis. We'll check CT of the head to rule out any acute intracranial abnormality. No focal deficit to suggest any stroke.  3 Type 2 diabetes mellitus ([place])  unclear control. Will check hemoglobin A1c. Placing the patient on sliding scale insulin. Holding home medication.  4 hyperkalemia. Potassium mildly elevated. Next and most likely consistent with acute kidney injury. We'll monitor ins and outs. Hydrate with IV fluids. Recheck the labs. Next and give IV calcium  gluconate. In the potassium level continues to remain elevated which would also give her Kayexalate.  5 acute kidney injury. Most likely in the setting of an infection. Avoid nephrotoxic medication, pharmacy consultation for antibiotic dose. Hydrate with IV fluids. Monitor ins and outs.  6  GERD  continue PPI.  7 Nausea with vomiting  etiology currently unclear but currently no nausea no vomiting. We'll hydrate the patient with IV fluids. No abdominal tenderness. Check lipase level. Lactic acid level is normal. When necessary Zofran.  8  Diabetic neuropathy (H[facility]) Resuming all medication that can affect alertness starting tomorrow.  Nutrition: advance as tolerated aspiration precaution  DVT Prophylaxis: subcutaneous Heparin  Advance goals of care discussion: full code   Family Communication: family was present at bedside, opportunity was given to ask question and all questions were answered satisfactorily at the time of interview. Disposition: Admitted as inpatient, step-down unit.  Author: Berle Mull, MD [facility] Pager: [phone] 03/30/2015  If 7PM-7AM, please contact night-coverage [email or web address] Password [email or web address]

## 2015-03-30 NOTE — Progress Notes (Signed)
Pharmacy Antibiotic Time-Out Note  Chloe Harding is a 69 y.o. year-old female admitted on 03/30/2015.  The patient is currently on Rocephin & Azithromycin for rule out sepsis. Pharmacy dosing requested, no adjustment necessary for renal function  Assessment/Plan:  Rocephin 1gm q24 & Azithromycin 500mg  q24   Recent Labs Lab 03/30/15 1458  WBC 14.7*    Recent Labs Lab 03/30/15 1458  CREATININE 1.72*   CrCl cannot be calculated (Unknown ideal weight.). Tmax/24h 100.8  Antimicrobial allergies: none  Antimicrobials this admission: 11/12 Rocephin >>  11/12 Azithromycin  >>   Microbiology Results: 1112 BCx: sent  requested Sputum: not collected  11/12 MRSA PCR: not collected  Thank you for allowing pharmacy to be a part of this patient's care. Pharmacy will sign-off protocols, no need for renal adjustment with either medication.  Minda Ditto PharmD Pager [phone] 03/30/2015, 5:42 PM

## 2015-03-30 NOTE — ED Provider Notes (Signed)
CSN: [id number]     Arrival date & time 03/30/15  1406 History   First MD Initiated Contact with Patient 03/30/15 1459     Chief Complaint  Patient presents with  . Hypotension     (Consider location/radiation/quality/duration/timing/severity/associated sxs/prior Treatment) HPI  69 year old female with a history of hypertension and diabetes presents with generalized weakness for the past 24 hours. She states she had a couple episodes of emesis and then has been feeling diffusely weak with no strength or energy since then. This morning her husband noticed that her glucose was high at 260 and they took to oral medications from her sister-in-law that they're not sure what they were were before for diabetes. Patient states she has felt worse since then. Denies any fevers or chills. She denies dysuria but has been having urinary frequency for the last few days. Has also been having bilateral flank pain. No cough or shortness of breath. Denies headache, focal weakness, numbness, chest pain, or abdominal pain. Initially hypotensive to 68/43 in triage.   Past Medical History  Diagnosis Date  . Hypertension   . Diabetes mellitus   . Renal disorder     shutting down 4 years ago  . Arthritis   . Depression   . Reflux   . Hiatal hernia   . Hypercholesteremia    Past Surgical History  Procedure Laterality Date  . Cholecystectomy     No family history on file. Social History  Substance Use Topics  . Smoking status: Never Smoker   . Smokeless tobacco: None  . Alcohol Use: No   OB History    No data available     Review of Systems  Constitutional: Negative for fever.  Respiratory: Negative for cough and shortness of breath.   Cardiovascular: Negative for chest pain.  Gastrointestinal: Positive for vomiting. Negative for nausea, abdominal pain and diarrhea.  Genitourinary: Positive for frequency. Negative for dysuria.  Musculoskeletal: Positive for back pain.  Neurological: Positive  for weakness. Negative for numbness and headaches.  All other systems reviewed and are negative.     Allergies  Amlodipine besylate  Home Medications   Prior to Admission medications   Medication Sig Start Date End Date Taking? Authorizing Provider  carvedilol (COREG) 12.5 MG tablet Take 12.5 mg by mouth 2 (two) times daily with a meal.    Historical Provider, MD  Chlorpheniramine-DM (CORICIDIN COUGH/COLD) 4-30 MG TABS Take 2 capsules by mouth every 4 (four) hours as needed (coughing).    Historical Provider, MD  citalopram (CELEXA) 40 MG tablet Take 40 mg by mouth daily.    Historical Provider, MD  diltiazem (CARDIZEM) 30 MG tablet Take 30-60 mg by mouth 2 (two) times daily. 60 mg in the morning and 30mg  in the evening    Historical Provider, MD  gabapentin (NEURONTIN) 600 MG tablet Take 600-1,200 mg by mouth 2 (two) times daily. 1 cap in the morning and 2 cap at night    Historical Provider, MD  gemfibrozil (LOPID) 600 MG tablet Take 600 mg by mouth 2 (two) times daily before a meal.    Historical Provider, MD  glipiZIDE (GLUCOTROL) 5 MG tablet Take 5 mg by mouth 2 (two) times daily before a meal.    Historical Provider, MD  HYDROcodone-acetaminophen (NORCO/VICODIN) 5-325 MG per tablet Take 1 tablet by mouth every 6 (six) hours as needed for severe pain. 01/01/15   Margarita Mail, PA-C  lisinopril-hydrochlorothiazide (PRINZIDE,ZESTORETIC) 20-12.5 MG per tablet Take 1 tablet by mouth 2 (two)  times daily.     Historical Provider, MD  omeprazole (PRILOSEC) 20 MG capsule Take 20 mg by mouth 2 (two) times daily.     Historical Provider, MD  pravastatin (PRAVACHOL) 20 MG tablet Take 20 mg by mouth at bedtime.    Historical Provider, MD  rOPINIRole (REQUIP) 0.5 MG tablet Take 0.5-1.5 mg by mouth at bedtime.    Historical Provider, MD  vitamin B-12 (CYANOCOBALAMIN) 250 MCG tablet Take 250 mcg by mouth daily.    Historical Provider, MD   BP 104/49 mmHg  Pulse 75  Temp(Src) 98.4 F (36.9 C)  (Oral)  Resp 22  SpO2 94% Physical Exam  Constitutional: She is oriented to person, place, and time. She appears well-developed and well-nourished.  HENT:  Head: Normocephalic and atraumatic.  Right Ear: External ear normal.  Left Ear: External ear normal.  Nose: Nose normal.  Eyes: EOM are normal. Pupils are equal, round, and reactive to light. Right eye exhibits no discharge. Left eye exhibits no discharge.  Neck: Neck supple.  Cardiovascular: Normal rate, regular rhythm and normal heart sounds.   Pulmonary/Chest: Effort normal and breath sounds normal.  Abdominal: Soft. There is no tenderness.  Neurological: She is alert and oriented to person, place, and time.  CN 2-12 grossly intact. Diffusely weak but equal strength, 5/5, in all 4 extremities  Skin: Skin is warm and dry.  Nursing note and vitals reviewed.   ED Course  Procedures (including critical care time) Labs Review Labs Reviewed  COMPREHENSIVE METABOLIC PANEL - Abnormal; Notable for the following:    Potassium 6.1 (*)    Glucose, Bld 156 (*)    BUN 40 (*)    Creatinine, Ser 1.72 (*)    Albumin 3.4 (*)    ALT 12 (*)    GFR calc non Af Amer 29 (*)    GFR calc Af Amer 34 (*)    All other components within normal limits  CBC - Abnormal; Notable for the following:    WBC 14.7 (*)    RBC 3.54 (*)    Hemoglobin 11.1 (*)    HCT 32.4 (*)    All other components within normal limits  URINALYSIS, ROUTINE W REFLEX MICROSCOPIC (NOT AT [facility]) - Abnormal; Notable for the following:    Leukocytes, UA SMALL (*)    All other components within normal limits  URINE MICROSCOPIC-ADD ON - Abnormal; Notable for the following:    Casts HYALINE CASTS (*)    All other components within normal limits  CBC WITH DIFFERENTIAL/PLATELET - Abnormal; Notable for the following:    WBC 10.7 (*)    RBC 2.98 (*)    Hemoglobin 9.3 (*)    HCT 27.4 (*)    Platelets 137 (*)    All other components within normal limits  COMPREHENSIVE METABOLIC  PANEL - Abnormal; Notable for the following:    Potassium 5.4 (*)    CO2 21 (*)    Glucose, Bld 105 (*)    BUN 39 (*)    Creatinine, Ser 1.39 (*)    Calcium 8.6 (*)    Total Protein 5.6 (*)    Albumin 2.7 (*)    ALT 12 (*)    GFR calc non Af Amer 38 (*)    GFR calc Af Amer 44 (*)    All other components within normal limits  BLOOD GAS, ARTERIAL - Abnormal; Notable for the following:    pH, Arterial 7.324 (*)    pCO2 arterial 34.3 (*)  pO2, Arterial 102 (*)    Bicarbonate 17.3 (*)    Acid-base deficit 7.5 (*)    All other components within normal limits  CBG MONITORING, ED - Abnormal; Notable for the following:    Glucose-Capillary 159 (*)    All other components within normal limits  CBG MONITORING, ED - Abnormal; Notable for the following:    Glucose-Capillary 129 (*)    All other components within normal limits  MRSA PCR SCREENING  CULTURE, BLOOD (ROUTINE X 2)  CULTURE, BLOOD (ROUTINE X 2)  CULTURE, EXPECTORATED SPUTUM-ASSESSMENT  LACTIC ACID, PLASMA  LACTIC ACID, PLASMA  PROCALCITONIN  PROTIME-INR  APTT  TSH  GLUCOSE, CAPILLARY  CBC WITH DIFFERENTIAL/PLATELET  COMPREHENSIVE METABOLIC PANEL  I-STAT CG4 LACTIC ACID, ED    Imaging Review Dg Chest Port 1 View  03/30/2015  CLINICAL DATA:  Hypotension and weakness. EXAM: PORTABLE CHEST 1 VIEW COMPARISON:  01/01/2015 and 09/06/2013 FINDINGS: Patient rotated to the left. Lungs are hypoinflated with stable elevation of the right hemidiaphragm. New airspace process over the right lung suggesting a pneumonia with associated linear atelectasis in the right base. Cardiomediastinal silhouette is within normal. There is calcified plaque over the thoracic aorta. There are mild degenerative changes of the spine. IMPRESSION: Linear atelectasis right base. New airspace process over the right midlung likely a pneumonia. Electronically Signed   By: Marin Olp M.D.   On: 03/30/2015 16:05   I have personally reviewed and evaluated  these images and lab results as part of my medical decision-making.   EKG Interpretation   Date/Time:  Saturday March 30 2015 14:44:09 EST Ventricular Rate:  78 PR Interval:  152 QRS Duration: 84 QT Interval:  349 QTC Calculation: 397 R Axis:   22 Text Interpretation:  Sinus rhythm Borderline low voltage, extremity leads  no significant change since April 2015 Confirmed by Regenia Skeeter  MD, Wessie Shanks  ([id number]) on 03/30/2015 3:00:22 PM      MDM   Hypotension Community Acquired Pneumonia Acute Renal Injury  Patient's hypotension resolved after IV fluids. Has had some vomiting that could be contributing to her acute kidney injury and hypotension. She does feel somewhat better with fluids. She has a low-grade rectal fever of 100.8. Given Rocephin and azithro for possible pneumonia seen on the chest x-ray low she does not have pneumonialike symptoms. Has urinary tract symptoms but only small leukocytes on her urine. Given concern for sepsis, however she was given IV antibiotics and blood cultures were obtained. She will be admitted to the stepdown unit.    Sherwood Gambler, MD 03/31/15 [phone]

## 2015-03-30 NOTE — ED Notes (Signed)
Patient brought in by husband with initial complaints of hyperglycemia. CBG 129 in triage. Reports CBG of 32 this morning. Patient lethargic, hypotensive 68/43 in triage.

## 2015-03-30 NOTE — ED Notes (Signed)
Portable CXR at bedside.

## 2015-03-30 NOTE — Progress Notes (Signed)
ANTIBIOTIC CONSULT NOTE - INITIAL  Pharmacy Consult for Vancomycin and Zosyn  Indication: sepsis  Allergies  Allergen Reactions  . Amlodipine Besylate     REACTION: swelling    Patient Measurements: Height: 5\' 1"  (154.9 cm) Weight: 171 lb 15.3 oz (78 kg) IBW/kg (Calculated) : 47.8 Adjusted Body Weight:   Vital Signs: Temp: 98.6 F (37 C) (11/12 1929) Temp Source: Oral (11/12 1929) BP: 102/61 mmHg (11/12 1900) Pulse Rate: 78 (11/12 1900) Intake/Output from previous day:   Intake/Output from this shift: Total I/O In: 10 [I.V.:10] Out: -   Labs:  Recent Labs  03/30/15 1458 03/30/15 1825  WBC 14.7* 10.7*  HGB 11.1* 9.3*  PLT 224 137*  CREATININE 1.72* 1.39*   Estimated Creatinine Clearance: 36.6 mL/min (by C-G formula based on Cr of 1.39). No results for input(s): VANCOTROUGH, VANCOPEAK, VANCORANDOM, GENTTROUGH, GENTPEAK, GENTRANDOM, TOBRATROUGH, TOBRAPEAK, TOBRARND, AMIKACINPEAK, AMIKACINTROU, AMIKACIN in the last 72 hours.   Microbiology: Recent Results (from the past 720 hour(s))  MRSA PCR Screening     Status: None   Collection Time: 03/30/15  6:15 PM  Result Value Ref Range Status   MRSA by PCR NEGATIVE NEGATIVE Final    Comment:        The GeneXpert MRSA Assay (FDA approved for NASAL specimens only), is one component of a comprehensive MRSA colonization surveillance program. It is not intended to diagnose MRSA infection nor to guide or monitor treatment for MRSA infections.     Medical History: Past Medical History  Diagnosis Date  . Hypertension   . Diabetes mellitus   . Renal disorder     shutting down 4 years ago  . Arthritis   . Depression   . Reflux   . Hiatal hernia   . Hypercholesteremia     Medications:  Anti-infectives    Start     Dose/Rate Route Frequency Ordered Stop   03/31/15 2200  vancomycin (VANCOCIN) IVPB 1000 mg/200 mL premix     1,000 mg 200 mL/hr over 60 Minutes Intravenous Every 24 hours 03/30/15 2123     03/31/15 1700  azithromycin (ZITHROMAX) 500 mg in dextrose 5 % 250 mL IVPB  Status:  Discontinued     500 mg 250 mL/hr over 60 Minutes Intravenous Every 24 hours 03/30/15 1737 03/30/15 2114   03/31/15 1600  cefTRIAXone (ROCEPHIN) 1 g in dextrose 5 % 50 mL IVPB  Status:  Discontinued     1 g 100 mL/hr over 30 Minutes Intravenous Every 24 hours 03/30/15 1737 03/30/15 2114   03/30/15 2200  piperacillin-tazobactam (ZOSYN) IVPB 3.375 g     3.375 g 12.5 mL/hr over 240 Minutes Intravenous 3 times per day 03/30/15 2123     03/30/15 2130  vancomycin (VANCOCIN) 1,250 mg in sodium chloride 0.9 % 250 mL IVPB     1,250 mg 166.7 mL/hr over 90 Minutes Intravenous  Once 03/30/15 2123     03/30/15 1615  cefTRIAXone (ROCEPHIN) 1 g in dextrose 5 % 50 mL IVPB     1 g 100 mL/hr over 30 Minutes Intravenous  Once 03/30/15 1613 03/30/15 1649   03/30/15 1615  azithromycin (ZITHROMAX) 500 mg in dextrose 5 % 250 mL IVPB     500 mg 250 mL/hr over 60 Minutes Intravenous  Once 03/30/15 1613 03/30/15 1801     Assessment: Patient with sepsis and MD change antibiotics to Vancomycin and Zosyn.  Patient with reduced renal function.  Goal of Therapy:  Vancomycin trough level 15-20 mcg/ml Zosyn based  on renal function Appropriate antibiotic dosing for renal function; eradication of infection   Plan:  Measure antibiotic drug levels at steady state Follow up culture results Vancomycin 1250mg  iv x1, then 1gm iv q24hr  Nani Skillern Crowford 03/30/2015,9:25 PM

## 2015-03-30 NOTE — ED Notes (Signed)
Awake. Verbally responsive. A/O x4. Resp even and unlabored. No audible adventitious breath sounds noted. ABC's intact. SR on monitor. IV saline lock patent and intact. Pt noting to desata to 88%RA started O2 at 2lpm via [place].

## 2015-03-31 LAB — CBC WITH DIFFERENTIAL/PLATELET
BASOS PCT: 0 %
Basophils Absolute: 0 10*3/uL (ref 0.0–0.1)
EOS ABS: 0.1 10*3/uL (ref 0.0–0.7)
EOS PCT: 1 %
HCT: 25.7 % — ABNORMAL LOW (ref 36.0–46.0)
HEMOGLOBIN: 8.7 g/dL — AB (ref 12.0–15.0)
LYMPHS PCT: 28 %
Lymphs Abs: 2.3 10*3/uL (ref 0.7–4.0)
MCH: 31.6 pg (ref 26.0–34.0)
MCHC: 33.9 g/dL (ref 30.0–36.0)
MCV: 93.5 fL (ref 78.0–100.0)
Monocytes Absolute: 0.3 10*3/uL (ref 0.1–1.0)
Monocytes Relative: 3 %
NEUTROS ABS: 5.6 10*3/uL (ref 1.7–7.7)
Neutrophils Relative %: 68 %
PLATELETS: 127 10*3/uL — AB (ref 150–400)
RBC: 2.75 MIL/uL — ABNORMAL LOW (ref 3.87–5.11)
RDW: 14.2 % (ref 11.5–15.5)
WBC: 8.3 10*3/uL (ref 4.0–10.5)

## 2015-03-31 LAB — COMPREHENSIVE METABOLIC PANEL
ALBUMIN: 2.7 g/dL — AB (ref 3.5–5.0)
ALT: 11 U/L — ABNORMAL LOW (ref 14–54)
ANION GAP: 5 (ref 5–15)
AST: 15 U/L (ref 15–41)
Alkaline Phosphatase: 60 U/L (ref 38–126)
BUN: 38 mg/dL — ABNORMAL HIGH (ref 6–20)
CALCIUM: 8.4 mg/dL — AB (ref 8.9–10.3)
CHLORIDE: 115 mmol/L — AB (ref 101–111)
CO2: 20 mmol/L — AB (ref 22–32)
Creatinine, Ser: 1.28 mg/dL — ABNORMAL HIGH (ref 0.44–1.00)
GFR calc non Af Amer: 42 mL/min — ABNORMAL LOW (ref >60)
GFR, EST AFRICAN AMERICAN: 49 mL/min — AB (ref >60)
GLUCOSE: 79 mg/dL (ref 65–99)
POTASSIUM: 4.5 mmol/L (ref 3.5–5.1)
SODIUM: 140 mmol/L (ref 135–145)
Total Bilirubin: 0.7 mg/dL (ref 0.3–1.2)
Total Protein: 5.4 g/dL — ABNORMAL LOW (ref 6.5–8.1)

## 2015-03-31 LAB — GLUCOSE, CAPILLARY
GLUCOSE-CAPILLARY: 126 mg/dL — AB (ref 65–99)
Glucose-Capillary: 72 mg/dL (ref 65–99)

## 2015-03-31 MED ORDER — ASPIRIN-ACETAMINOPHEN-CAFFEINE 250-250-65 MG PO TABS
1.0000 | ORAL_TABLET | Freq: Four times a day (QID) | ORAL | Status: DC | PRN
  Administered 2015-03-31 – 2015-04-01 (×2): 1 via ORAL
  Filled 2015-03-31 (×4): qty 1

## 2015-03-31 MED ORDER — HYDROCODONE-ACETAMINOPHEN 5-325 MG PO TABS
1.0000 | ORAL_TABLET | Freq: Four times a day (QID) | ORAL | Status: DC | PRN
  Administered 2015-03-31 (×2): 1 via ORAL
  Filled 2015-03-31 (×2): qty 1

## 2015-03-31 MED ORDER — INSULIN ASPART 100 UNIT/ML ~~LOC~~ SOLN
0.0000 [IU] | Freq: Three times a day (TID) | SUBCUTANEOUS | Status: DC
  Administered 2015-03-31 – 2015-04-02 (×4): 1 [IU] via SUBCUTANEOUS

## 2015-03-31 MED ORDER — INSULIN ASPART 100 UNIT/ML ~~LOC~~ SOLN: 0.0000 [IU] | Freq: Every day | SUBCUTANEOUS | Status: DC

## 2015-03-31 NOTE — Progress Notes (Signed)
[facility] Progress Note    Patient: Chloe Harding  [id number]  DOB: [birth year]  DOA: 03/30/2015 Date of Service: the patient was seen and examined on 03/31/2015  Subjective: Patient currently denies any acute complaints. No chest abdominal pain. Complains of fatigue  Nutrition: Tolerating diet Activity: Currently on bedrest Last BM: 03/30/2015  Brief Summary of Hospitalization: Day 1 of admission The patient is presenting with complaints of confusion as well as hypotension as well as hyperglycemia. Patient was found to be having community-acquired pneumonia. Most likely aspiration event. Admitted on 03/30/2015 to step down unit.  Assessment and Plan 1. Sepsis [facility]) Patient was admitted with acute kidney injury, hypotension, confusion, leukocytosis with evidence of infection on the chest x-ray meeting criteria For sepsis. Initially she was started on ceftriaxone azithromycin overnight she dropped her blood pressure and was changed to vancomycin and Zosyn currently her blood pressure is significantly better. She is mentating well as well. Tachycardia and respiratory distress. Creatinine has improved significantly. Cultures are negative so far. Transfer her to telemetry floor. Continue with current management. Continue holding her blood pressure medication.  2.Type 2 diabetes mellitus ([place]) Blood sugar well controlled. Continue sliding scale insulin  3 acute encephalopathy. Currently patient is getting better. Most secondary to sepsis. CT of the head is unremarkable. Resuming her home psychotropic medications.  4 essential hypertension. Patient is on carvedilol, Cardizem, lisinopril and hydrochlorothiazide at home. Blood pressure is currently improving. Next I'll continue hold.  5  GERD Continue PPI  6  Hyperkalemia   Acute kidney injury ([place]) Most likely in the setting of acute infection as well as nausea and vomiting. Currently resolved. Continue  monitoring on telemetry continue IV hydration. Avoid nephrotoxic medication.  7  Nausea with vomiting Possibly viral gastroenteritis. Currently does not have any nausea or vomiting. Unable to tolerate diet. Continue monitoring  8  Diabetic neuropathy (HCC) Continuing gabapentin   DVT Prophylaxis: subcutaneous Heparin Nutrition: Cardiac and diabetic diet Advance goals of care discussion: full code  Disposition:  Discharge in 1-2 days pending physical therapy consultation, to probably home.  Consultants: none Procedures: none Antibiotics: Ceftriaxone azithromycin  03/30/2015  start date,stop date 03/31/2015 Vancomycin and Zosyn 03/31/2015 start date   Intake/Output Summary (Last 24 hours) at 03/31/15 1515 Last data filed at 03/31/15 1300  Gross per 24 hour  Intake   2280 ml  Output   1200 ml  Net   1080 ml   Filed Weights   03/30/15 1800 03/31/15 0500  Weight: 78 kg (171 lb 15.3 oz) 79.3 kg (174 lb 13.2 oz)    Objective: Physical Exam: Filed Vitals:   03/31/15 1000 03/31/15 1100 03/31/15 1200 03/31/15 1300  BP: 124/47 117/52 124/53 128/55  Pulse: 89 84 80 80  Temp:   99.6 F (37.6 C)   TempSrc:   Oral   Resp: 20 20 17 17   Height:      Weight:      SpO2: 96% 96% 95% 96%     General: Appear in mild distress Eyes: PERL ENT: Oral Mucosa moist. Neck: no JVD Cardiovascular: S1 and S2 Present, no Murmur, Respiratory: Bilateral Air entry present and right basal  Crackles, no wheezes Abdomen: Bowel Sound present, Soft and no tenderness Skin: no Rash Extremities: no Pedal edema, no calf tenderness Neurologic: Grossly no focal neuro deficit.  Data Reviewed: CBC:  Recent Labs Lab 03/30/15 1458 03/30/15 1825 03/31/15 0345  WBC 14.7* 10.7* 8.3  NEUTROABS  --  7.3 5.6  HGB 11.1*  9.3* 8.7*  HCT 32.4* 27.4* 25.7*  MCV 91.5 91.9 93.5  PLT 224 137* [id number]*   Basic Metabolic Panel:  Recent Labs Lab 03/30/15 1458 03/30/15 1825 03/31/15 0345  NA 137 138  140  K 6.1* 5.4* 4.5  CL 107 110 115*  CO2 22 21* 20*  GLUCOSE 156* 105* 79  BUN 40* 39* 38*  CREATININE 1.72* 1.39* 1.28*  CALCIUM 9.4 8.6* 8.4*   Liver Function Tests:  Recent Labs Lab 03/30/15 1458 03/30/15 1825 03/31/15 0345  AST 18 15 15   ALT 12* 12* 11*  ALKPHOS 82 67 60  BILITOT 1.1 0.6 0.7  PROT 6.8 5.6* 5.4*  ALBUMIN 3.4* 2.7* 2.7*   No results for input(s): LIPASE, AMYLASE in the last 168 hours. No results for input(s): AMMONIA in the last 168 hours.  Cardiac Enzymes: No results for input(s): CKTOTAL, CKMB, CKMBINDEX, TROPONINI in the last 168 hours. BNP (last 3 results) No results for input(s): BNP in the last 8760 hours.  ProBNP (last 3 results) No results for input(s): PROBNP in the last 8760 hours.  CBG:  Recent Labs Lab 03/30/15 1433 03/30/15 1447 03/30/15 2241 03/31/15 0532  GLUCAP 129* 159* 87 72    Recent Results (from the past 240 hour(s))  MRSA PCR Screening     Status: None   Collection Time: 03/30/15  6:15 PM  Result Value Ref Range Status   MRSA by PCR NEGATIVE NEGATIVE Final    Comment:        The GeneXpert MRSA Assay (FDA approved for NASAL specimens only), is one component of a comprehensive MRSA colonization surveillance program. It is not intended to diagnose MRSA infection nor to guide or monitor treatment for MRSA infections.      Studies: Ct Head Wo Contrast  03/31/2015  CLINICAL DATA:  Episode of hyperglycemia and lethargy. Acute encephalopathy EXAM: CT HEAD WITHOUT CONTRAST TECHNIQUE: Contiguous axial images were obtained from the base of the skull through the vertex without intravenous contrast. COMPARISON:  12/12/2009 FINDINGS: Skull and Sinuses:Negative for fracture or destructive process. Mild mucosal edema in ethmoids and right sphenoid sinuses. Orbits: No acute abnormality. Brain: No evidence of acute infarction, hemorrhage, hydrocephalus, or mass lesion/mass effect. Stable pattern of low-density in the bilateral  cerebral white matter, greatest in the biparietal and periventricular frontal regions. Appearance consistent with moderate chronic small vessel disease in this patient with history of hypertension and diabetes. IMPRESSION: 1. No acute finding or change from 2011. 2. Chronic small vessel disease. Electronically Signed   By: Monte Fantasia M.D.   On: 03/31/2015 03:19   Dg Chest Port 1 View  03/30/2015  CLINICAL DATA:  Hypotension and weakness. EXAM: PORTABLE CHEST 1 VIEW COMPARISON:  01/01/2015 and 09/06/2013 FINDINGS: Patient rotated to the left. Lungs are hypoinflated with stable elevation of the right hemidiaphragm. New airspace process over the right lung suggesting a pneumonia with associated linear atelectasis in the right base. Cardiomediastinal silhouette is within normal. There is calcified plaque over the thoracic aorta. There are mild degenerative changes of the spine. IMPRESSION: Linear atelectasis right base. New airspace process over the right midlung likely a pneumonia. Electronically Signed   By: Marin Olp M.D.   On: 03/30/2015 16:05    Scheduled Meds: . citalopram  40 mg Oral Daily  . gabapentin  1,200 mg Oral QHS  . gabapentin  600 mg Oral Daily  . heparin  5,000 Units Subcutaneous 3 times per day  . pantoprazole  40 mg Oral Daily  .  piperacillin-tazobactam (ZOSYN)  IV  3.375 g Intravenous 3 times per day  . pravastatin  20 mg Oral QHS  . pregabalin  75 mg Oral TID  . rOPINIRole  2 mg Oral QHS  . sodium chloride  3 mL Intravenous Q12H  . vancomycin  1,000 mg Intravenous Q24H   Continuous Infusions: . sodium chloride 100 mL/hr at 03/31/15 0201    Time spent: 25 minutes.  Author: Berle Mull, MD [facility] Pager: [phone] 03/31/2015 3:15 PM  If 7PM-7AM, please contact night-coverage at [email or web address], password [facility]

## 2015-03-31 NOTE — Progress Notes (Addendum)
Called and notified mid-level provider on call with [facility] about patient appearing drowsy and low blood pressures overnight during the shift. New orders were written. Patient went for routine head CT during the shift. She had complaints of headache pain and prn Tylenol po was given for pain. Patient reports it partially effective. Sent text page this am to notify provider on call. Still awaiting page. Will notify oncoming RN.

## 2015-04-01 DIAGNOSIS — I10 Essential (primary) hypertension: Secondary | ICD-10-CM

## 2015-04-01 LAB — COMPREHENSIVE METABOLIC PANEL
ALBUMIN: 2.9 g/dL — AB (ref 3.5–5.0)
ALK PHOS: 68 U/L (ref 38–126)
ALT: 12 U/L — AB (ref 14–54)
ANION GAP: 6 (ref 5–15)
AST: 13 U/L — AB (ref 15–41)
BUN: 20 mg/dL (ref 6–20)
CALCIUM: 9 mg/dL (ref 8.9–10.3)
CO2: 20 mmol/L — AB (ref 22–32)
Chloride: 115 mmol/L — ABNORMAL HIGH (ref 101–111)
Creatinine, Ser: 0.94 mg/dL (ref 0.44–1.00)
GFR calc Af Amer: >60 mL/min (ref >60)
GFR calc non Af Amer: >60 mL/min (ref >60)
GLUCOSE: 132 mg/dL — AB (ref 65–99)
Potassium: 4.4 mmol/L (ref 3.5–5.1)
SODIUM: 141 mmol/L (ref 135–145)
Total Bilirubin: 0.8 mg/dL (ref 0.3–1.2)
Total Protein: 6 g/dL — ABNORMAL LOW (ref 6.5–8.1)

## 2015-04-01 LAB — GLUCOSE, CAPILLARY
GLUCOSE-CAPILLARY: 173 mg/dL — AB (ref 65–99)
GLUCOSE-CAPILLARY: 110 mg/dL — AB (ref 65–99)
GLUCOSE-CAPILLARY: 120 mg/dL — AB (ref 65–99)
Glucose-Capillary: 128 mg/dL — ABNORMAL HIGH (ref 65–99)
Glucose-Capillary: 186 mg/dL — ABNORMAL HIGH (ref 65–99)

## 2015-04-01 LAB — CBC WITH DIFFERENTIAL/PLATELET
BASOS ABS: 0 10*3/uL (ref 0.0–0.1)
BASOS PCT: 0 %
EOS ABS: 0.1 10*3/uL (ref 0.0–0.7)
Eosinophils Relative: 2 %
HCT: 26.1 % — ABNORMAL LOW (ref 36.0–46.0)
HEMOGLOBIN: 8.7 g/dL — AB (ref 12.0–15.0)
Lymphocytes Relative: 30 %
Lymphs Abs: 1.7 10*3/uL (ref 0.7–4.0)
MCH: 30.3 pg (ref 26.0–34.0)
MCHC: 33.3 g/dL (ref 30.0–36.0)
MCV: 90.9 fL (ref 78.0–100.0)
Monocytes Absolute: 0.3 10*3/uL (ref 0.1–1.0)
Monocytes Relative: 5 %
NEUTROS PCT: 63 %
Neutro Abs: 3.7 10*3/uL (ref 1.7–7.7)
Platelets: 149 10*3/uL — ABNORMAL LOW (ref 150–400)
RBC: 2.87 MIL/uL — AB (ref 3.87–5.11)
RDW: 13.8 % (ref 11.5–15.5)
WBC: 5.8 10*3/uL (ref 4.0–10.5)

## 2015-04-01 LAB — PROTIME-INR
INR: 1.05 (ref 0.00–1.49)
Prothrombin Time: 13.9 seconds (ref 11.6–15.2)

## 2015-04-01 MED ORDER — CARVEDILOL 6.25 MG PO TABS
6.2500 mg | ORAL_TABLET | Freq: Two times a day (BID) | ORAL | Status: DC
  Administered 2015-04-01 – 2015-04-02 (×3): 6.25 mg via ORAL
  Filled 2015-04-01 (×4): qty 1

## 2015-04-01 MED ORDER — SALINE SPRAY 0.65 % NA SOLN
1.0000 | NASAL | Status: DC | PRN
  Filled 2015-04-01: qty 44

## 2015-04-01 MED ORDER — ENOXAPARIN SODIUM 40 MG/0.4ML ~~LOC~~ SOLN
40.0000 mg | SUBCUTANEOUS | Status: DC
  Administered 2015-04-01 – 2015-04-02 (×2): 40 mg via SUBCUTANEOUS
  Filled 2015-04-01 (×2): qty 0.4

## 2015-04-01 MED ORDER — LEVOFLOXACIN 500 MG PO TABS
500.0000 mg | ORAL_TABLET | Freq: Every day | ORAL | Status: DC
  Administered 2015-04-01 – 2015-04-02 (×2): 500 mg via ORAL
  Filled 2015-04-01 (×2): qty 1

## 2015-04-01 MED ORDER — VANCOMYCIN HCL IN DEXTROSE 750-5 MG/150ML-% IV SOLN
750.0000 mg | Freq: Two times a day (BID) | INTRAVENOUS | Status: DC
  Filled 2015-04-01: qty 150

## 2015-04-01 MED ORDER — LEVOFLOXACIN IN D5W 500 MG/100ML IV SOLN: 500.0000 mg | INTRAVENOUS | Status: DC

## 2015-04-01 NOTE — Care Management Note (Signed)
Case Management Note  Patient Details  Name: Chloe Harding MRN: [id number] Date of Birth: 01/12/1946  Subjective/Objective:         sirs           Action/Plan:Date: April 01, 2015 Chart reviewed for concurrent status and case management needs. Will continue to follow patient for changes and needs: Velva Harman, RN, BSN, [place]   [phone]   Expected Discharge Date:  04/03/15               Expected Discharge Plan:  Home/Self Care  In-House Referral:  NA  Discharge planning Services  CM Consult  Post Acute Care Choice:  NA Choice offered to:  NA  DME Arranged:    DME Agency:     HH Arranged:    HH Agency:     Status of Service:  In process, will continue to follow  Medicare Important Message Given:    Date Medicare IM Given:    Medicare IM give by:    Date Additional Medicare IM Given:    Additional Medicare Important Message give by:     If discussed at [place] of Stay Meetings, dates discussed:    Additional Comments:  Leeroy Cha, RN 04/01/2015, 9:23 AM

## 2015-04-01 NOTE — Clinical Documentation Improvement (Signed)
Hospitalist  Can the diagnosis of pneumonia be further specified?   Type of Pneumonia - CAP, HCAP, Bacterial Pneumonia, Aspiration Pneumonia, Gram Negative Pneumonia, Other Condition, Unable to Clinically Determine  Document causative organism if known  Document any associated diagnoses/conditions such as Respiratory Failure, Sepsis, Underlying Lung Disease (Specify), Other (Specify)  Other condition  Clinically Undetermined   Supporting Information: :  Patient found to have CAP, most likely aspiration event per 11/13 progress notes.   Please exercise your independent, professional judgment when responding. A specific answer is not anticipated or expected.   Thank You, [place] [phone]

## 2015-04-01 NOTE — Progress Notes (Signed)
[facility] Progress Note    Patient: Chloe Harding  [id number]  DOB: 1946/01/10  DOA: 03/30/2015 Date of Service: the patient was seen and examined on 04/01/2015  Subjective:  He is resolved. No acute complaint. The nausea and vomiting. Has been able to tolerate diet. Had a bowel movement yesterday..  Brief Summary of Hospitalization: Day 2 of admission The patient is presenting with complaints of confusion as well as hypotension as well as hyperglycemia. Patient was found to be having community-acquired pneumonia. Most likely aspiration event. Admitted on 03/30/2015 to step down unit.  Significant improvement in blood pressure as well as mentation and patient is being transferred to telemetry floor on 03/31/2015  Assessment and Plan 1. Sepsis [facility]) Patient was admitted with acute kidney injury, hypotension, confusion, leukocytosis with evidence of infection on the chest x-ray meeting criteria For sepsis. Initially she was started on ceftriaxone azithromycin but overnight she dropped her blood pressure and was changed to vancomycin and Zosyn today her blood pressure is elevated.  She is mentating well as well. Tachycardia and respiratory distress. Creatinine has  normalized Cultures are negative so far. Transfer her to telemetry floor.  switching antibiotic to Levaquin  orally  2.Type 2 diabetes mellitus (HCC) Blood sugar well controlled. Continue sliding scale insulin  3 acute encephalopathy. Currently patient is getting better. Most secondary to sepsis. CT of the head is unremarkable. Resuming her home psychotropic medications.  4 essential hypertension. Patient is on carvedilol, Cardizem, lisinopril and hydrochlorothiazide at home. Blood pressure is currently  Elevated. resume Coreg and low-dose today. Reevaluation in the evening to resume Cardizem as well.  5  GERD Continue PPI  6  Hyperkalemia   Acute kidney injury ([place]) Most likely in the setting of  acute infection as well as nausea and vomiting. Currently resolved.  holding IV fluids. Avoid nephrotoxic medication.  7  Nausea with vomiting Possibly viral gastroenteritis. Currently does not have any nausea or vomiting. Continue monitoring  8  Diabetic neuropathy (HCC) Continuing gabapentin  DVT Prophylaxis: subcutaneous Heparin Nutrition: Cardiac and diabetic diet Advance goals of care discussion: full code  Disposition:  Discharge in 1-2 days pending physical therapy consultation, to probably home.  Consultants: none Procedures: none Antibiotics: Ceftriaxone azithromycin  03/30/2015  start date,stop date 03/31/2015 Vancomycin and Zosyn 03/31/2015 start date  Stop date 04/01/2015  levofloxacin start date 04/01/2015   Intake/Output Summary (Last 24 hours) at 04/01/15 1055 Last data filed at 04/01/15 0830  Gross per 24 hour  Intake   3110 ml  Output   4750 ml  Net  -1640 ml   Filed Weights   03/30/15 1800 03/31/15 0500 04/01/15 0400  Weight: 78 kg (171 lb 15.3 oz) 79.3 kg (174 lb 13.2 oz) 78.8 kg (173 lb 11.6 oz)    Objective: Physical Exam: Filed Vitals:   04/01/15 0000 04/01/15 0400 04/01/15 0800 04/01/15 0922  BP:  156/72  175/85  Pulse:  89  84  Temp: 99.3 F (37.4 C) 99 F (37.2 C) 98.2 F (36.8 C)   TempSrc: Oral Oral Oral   Resp:  24  23  Height:      Weight:  78.8 kg (173 lb 11.6 oz)    SpO2:  94%  96%   General: Appear in  No distress ENT: Oral Mucosa moist. Cardiovascular: S1 and S2 Present, no Murmur, Respiratory: Bilateral Air entry present and right basal Crackles Abdomen: Bowel Sound present, Soft and no tenderness Extremities: no Pedal edema, no calf tenderness  Data Reviewed: CBC:  Recent Labs Lab 03/30/15 1458 03/30/15 1825 03/31/15 0345 04/01/15 0340  WBC 14.7* 10.7* 8.3 5.8  NEUTROABS  --  7.3 5.6 3.7  HGB 11.1* 9.3* 8.7* 8.7*  HCT 32.4* 27.4* 25.7* 26.1*  MCV 91.5 91.9 93.5 90.9  PLT 224 137* 127* 149*   Basic  Metabolic Panel:  Recent Labs Lab 03/30/15 1458 03/30/15 1825 03/31/15 0345 04/01/15 0340  NA 137 138 140 141  K 6.1* 5.4* 4.5 4.4  CL 107 110 115* 115*  CO2 22 21* 20* 20*  GLUCOSE 156* 105* 79 132*  BUN 40* 39* 38* 20  CREATININE 1.72* 1.39* 1.28* 0.94  CALCIUM 9.4 8.6* 8.4* 9.0   Liver Function Tests:  Recent Labs Lab 03/30/15 1458 03/30/15 1825 03/31/15 0345 04/01/15 0340  AST 18 15 15  13*  ALT 12* 12* 11* 12*  ALKPHOS 82 67 60 68  BILITOT 1.1 0.6 0.7 0.8  PROT 6.8 5.6* 5.4* 6.0*  ALBUMIN 3.4* 2.7* 2.7* 2.9*   No results for input(s): LIPASE, AMYLASE in the last 168 hours. No results for input(s): AMMONIA in the last 168 hours.  Cardiac Enzymes: No results for input(s): CKTOTAL, CKMB, CKMBINDEX, TROPONINI in the last 168 hours. BNP (last 3 results) No results for input(s): BNP in the last 8760 hours.  ProBNP (last 3 results) No results for input(s): PROBNP in the last 8760 hours.  CBG:  Recent Labs Lab 03/30/15 2241 03/31/15 0532 03/31/15 1650 03/31/15 2151 04/01/15 0725  GLUCAP 87 72 126* 173* 110*    Recent Results (from the past 240 hour(s))  MRSA PCR Screening     Status: None   Collection Time: 03/30/15  6:15 PM  Result Value Ref Range Status   MRSA by PCR NEGATIVE NEGATIVE Final    Comment:        The GeneXpert MRSA Assay (FDA approved for NASAL specimens only), is one component of a comprehensive MRSA colonization surveillance program. It is not intended to diagnose MRSA infection nor to guide or monitor treatment for MRSA infections.      Studies: Ct Head Wo Contrast  03/31/2015  CLINICAL DATA:  Episode of hyperglycemia and lethargy. Acute encephalopathy EXAM: CT HEAD WITHOUT CONTRAST TECHNIQUE: Contiguous axial images were obtained from the base of the skull through the vertex without intravenous contrast. COMPARISON:  12/12/2009 FINDINGS: Skull and Sinuses:Negative for fracture or destructive process. Mild mucosal edema in  ethmoids and right sphenoid sinuses. Orbits: No acute abnormality. Brain: No evidence of acute infarction, hemorrhage, hydrocephalus, or mass lesion/mass effect. Stable pattern of low-density in the bilateral cerebral white matter, greatest in the biparietal and periventricular frontal regions. Appearance consistent with moderate chronic small vessel disease in this patient with history of hypertension and diabetes. IMPRESSION: 1. No acute finding or change from 2011. 2. Chronic small vessel disease. Electronically Signed   By: Monte Fantasia M.D.   On: 03/31/2015 03:19   Dg Chest Port 1 View  03/30/2015  CLINICAL DATA:  Hypotension and weakness. EXAM: PORTABLE CHEST 1 VIEW COMPARISON:  01/01/2015 and 09/06/2013 FINDINGS: Patient rotated to the left. Lungs are hypoinflated with stable elevation of the right hemidiaphragm. New airspace process over the right lung suggesting a pneumonia with associated linear atelectasis in the right base. Cardiomediastinal silhouette is within normal. There is calcified plaque over the thoracic aorta. There are mild degenerative changes of the spine. IMPRESSION: Linear atelectasis right base. New airspace process over the right midlung likely a pneumonia. Electronically Signed  By: Marin Olp M.D.   On: 03/30/2015 16:05    Scheduled Meds: . carvedilol  6.25 mg Oral BID WC  . citalopram  40 mg Oral Daily  . enoxaparin (LOVENOX) injection  40 mg Subcutaneous Q24H  . gabapentin  1,200 mg Oral QHS  . gabapentin  600 mg Oral Daily  . insulin aspart  0-5 Units Subcutaneous QHS  . insulin aspart  0-9 Units Subcutaneous TID WC  . levofloxacin  500 mg Oral Daily  . pantoprazole  40 mg Oral Daily  . pravastatin  20 mg Oral QHS  . pregabalin  75 mg Oral TID  . rOPINIRole  2 mg Oral QHS  . sodium chloride  3 mL Intravenous Q12H   Continuous Infusions:    Time spent: 32minutes.  Author: Berle Mull, MD [facility] Pager: [phone] 04/01/2015 10:55  AM  If 7PM-7AM, please contact night-coverage at [email or web address], password [facility]

## 2015-04-01 NOTE — Clinical Documentation Improvement (Signed)
Hospitalist  Can the diagnosis of CKD be further specified?   CKD Stage I - GFR greater than or equal to 90  CKD Stage II - GFR 60-89  CKD Stage III - GFR 30-59  CKD Stage IV - GFR 15-29  CKD Stage V - GFR < 15  ESRD (End Stage Renal Disease)  Other condition  Unable to clinically determine   Supporting Information: : (risk factors, signs and symptoms, diagnostics, treatment) Patient with a history of CKD per 11/12 progress notes.  Labs:    Bun     Creat      GFR: 11/12:   39         1.39        38  Please exercise your independent, professional judgment when responding. A specific answer is not anticipated or expected.   Thank You, [place] [phone]

## 2015-04-01 NOTE — Progress Notes (Signed)
PHARMACIST - PHYSICIAN COMMUNICATION CONCERNING:  Lovenox for VTE prophylaxis  Pharmacy consulted to start lovenox for VTE px.  Pt has been on SQ heparin TID, last dose this morning ~5AM.  Hemoglobin low, appears stable.  Plts also slightly low.  No bleeding noted.  CrCl > 30 ml/min.  Wt ~80kg. BMI ~33.    RECOMMENDATION: Start lovenox 40mg  SQ q24h Need for further dosage adjustment appears unlikely at present.    Will sign off at this time.  Please reconsult if a change in clinical status warrants re-evaluation of dosage.  Ralene Bathe, PharmD, BCPS 04/01/2015, 10:15 AM  Pager: [phone]

## 2015-04-01 NOTE — Progress Notes (Addendum)
Pharmacy Antibiotic Time-Out Note  Chloe Harding is a 69 y.o. year-old female admitted on 03/30/2015.  The patient is currently on Vancomycin and Zosyn for sepsis secondary to CAP.  Assessment/Plan: Renal function has improved.  Blood cultures in process.   Increase vancomycin to 750mg  IV q12h.  Continue Zosyn 3.375g IV q8h (infuse over 4 hours).  Continue to follow renal fxn, cultures, VT at Css if warranted   Recent Labs Lab 03/30/15 1458 03/30/15 1825 03/31/15 0345 04/01/15 0340  WBC 14.7* 10.7* 8.3 5.8    Recent Labs Lab 03/30/15 1458 03/30/15 1825 03/31/15 0345 04/01/15 0340  CREATININE 1.72* 1.39* 1.28* 0.94   Estimated Creatinine Clearance: 54.4 mL/min (by C-G formula based on Cr of 0.94). Tmax/24h 99.6  Antimicrobial allergies: None  Antimicrobials this admission: 11/12 >> Rocephin  >> 11/12 11/12 >> Azithromycin  >>  11/12 11/12 >> vanc >> 11/12 >> zosyn >>  Levels/dose changes this admission: None yet  Microbiology Results: 11/12 BCx: IP 11/12 MRSA PCR: Negative  Thank you for allowing pharmacy to be a part of this patient's care.  Ralene Bathe, PharmD, BCPS 04/01/2015, 8:08 AM  Pager: [phone]  ADDENDUM: Antibiotics narrowed to levaquin per pharmacy.  QTc WNL on 11/13, also on PRN zofran (no doses charted) and celexa.   Start levaquin 500mg  PO q24h due to borderline renal function near 50 ml/min / CAP indication.   F/u renal fxn  Ralene Bathe, PharmD, BCPS 04/01/2015, 10:01 AM  Pager: [phone]

## 2015-04-02 LAB — CBC WITH DIFFERENTIAL/PLATELET
Basophils Absolute: 0 10*3/uL (ref 0.0–0.1)
Basophils Relative: 0 %
Eosinophils Absolute: 0 10*3/uL (ref 0.0–0.7)
Eosinophils Relative: 1 %
HEMATOCRIT: 27.5 % — AB (ref 36.0–46.0)
HEMOGLOBIN: 9.4 g/dL — AB (ref 12.0–15.0)
LYMPHS ABS: 1.8 10*3/uL (ref 0.7–4.0)
LYMPHS PCT: 40 %
MCH: 30.3 pg (ref 26.0–34.0)
MCHC: 34.2 g/dL (ref 30.0–36.0)
MCV: 88.7 fL (ref 78.0–100.0)
MONOS PCT: 5 %
Monocytes Absolute: 0.2 10*3/uL (ref 0.1–1.0)
NEUTROS PCT: 54 %
Neutro Abs: 2.5 10*3/uL (ref 1.7–7.7)
Platelets: 167 10*3/uL (ref 150–400)
RBC: 3.1 MIL/uL — AB (ref 3.87–5.11)
RDW: 13.2 % (ref 11.5–15.5)
WBC: 4.6 10*3/uL (ref 4.0–10.5)

## 2015-04-02 LAB — COMPREHENSIVE METABOLIC PANEL
ALK PHOS: 64 U/L (ref 38–126)
ALT: 14 U/L (ref 14–54)
AST: 14 U/L — ABNORMAL LOW (ref 15–41)
Albumin: 3 g/dL — ABNORMAL LOW (ref 3.5–5.0)
Anion gap: 8 (ref 5–15)
BILIRUBIN TOTAL: 0.8 mg/dL (ref 0.3–1.2)
BUN: 11 mg/dL (ref 6–20)
CALCIUM: 9.4 mg/dL (ref 8.9–10.3)
CO2: 21 mmol/L — AB (ref 22–32)
CREATININE: 0.68 mg/dL (ref 0.44–1.00)
Chloride: 110 mmol/L (ref 101–111)
Glucose, Bld: 158 mg/dL — ABNORMAL HIGH (ref 65–99)
Potassium: 4 mmol/L (ref 3.5–5.1)
SODIUM: 139 mmol/L (ref 135–145)
TOTAL PROTEIN: 6.4 g/dL — AB (ref 6.5–8.1)

## 2015-04-02 LAB — GLUCOSE, CAPILLARY
Glucose-Capillary: 125 mg/dL — ABNORMAL HIGH (ref 65–99)
Glucose-Capillary: 136 mg/dL — ABNORMAL HIGH (ref 65–99)

## 2015-04-02 LAB — HEMOGLOBIN A1C
Hgb A1c MFr Bld: 7.9 % — ABNORMAL HIGH (ref 4.8–5.6)
Mean Plasma Glucose: 180 mg/dL

## 2015-04-02 MED ORDER — SACCHAROMYCES BOULARDII 250 MG PO CAPS: 250.0000 mg | ORAL_CAPSULE | Freq: Two times a day (BID) | ORAL | Status: DC

## 2015-04-02 MED ORDER — SACCHAROMYCES BOULARDII 250 MG PO CAPS
250.0000 mg | ORAL_CAPSULE | Freq: Two times a day (BID) | ORAL | Status: DC
  Administered 2015-04-02: 250 mg via ORAL
  Filled 2015-04-02: qty 1

## 2015-04-02 MED ORDER — LEVOFLOXACIN 500 MG PO TABS: 500.0000 mg | ORAL_TABLET | Freq: Every day | ORAL | Status: DC

## 2015-04-02 NOTE — Care Management Important Message (Signed)
Important Message  Patient Details  Name: JAHLIYA OSHINSKI MRN: [id number] Date of Birth: [birth year]   Medicare Important Message Given:  Yes    Camillo Flaming 04/02/2015, 11:06 AMImportant Message  Patient Details  Name: LOURENE MULHERN MRN: [id number] Date of Birth: 13-Apr-1946   Medicare Important Message Given:  Yes    Camillo Flaming 04/02/2015, 11:05 AM

## 2015-04-02 NOTE — Care Management Note (Signed)
Case Management Note  Patient Details  Name: Chloe Harding MRN: [id number] Date of Birth: 1945/06/03  Subjective/Objective:      Admitted with sepsis               Action/Plan: Discharge planning  Expected Discharge Date:  04/03/15               Expected Discharge Plan:  Home/Self Care  In-House Referral:  NA  Discharge planning Services  CM Consult  Post Acute Care Choice:  NA Choice offered to:  NA  DME Arranged:  N/A DME Agency:  NA  HH Arranged:  NA HH Agency:  NA  Status of Service:  Completed, signed off  Medicare Important Message Given:  Yes Date Medicare IM Given:    Medicare IM give by:    Date Additional Medicare IM Given:    Additional Medicare Important Message give by:     If discussed at [place] of Stay Meetings, dates discussed:    Additional Comments:  Guadalupe Maple, RN 04/02/2015, 2:07 PM

## 2015-04-02 NOTE — Discharge Summary (Signed)
Discharge Summary [facility]   Patient: Chloe Harding   [id number]  DOB: [birth year]  PCP: Philis Fendt, MD Date of admission: 03/30/2015  Date of discharge: 04/02/2015   Discharge Diagnoses:  Principal Problem:   Sepsis ([place]) Active Problems:   Type 2 diabetes mellitus ([place])   Dyslipidemia   GERD   Hypotension   CAP (community acquired pneumonia)   Acute encephalopathy   Hyperkalemia   Acute kidney injury ([place])   Nausea with vomiting   Diabetic neuropathy ([place])   Recommendations for Outpatient Follow-up:  1. Please follow up with your PCP for resuming one of your blood pressure medication lisinopril-HCTZ. Medications and dosages:HOLD : LISINOPRIL-HCTZ FOR 1 WEEK  Diet recommendation: low salt diet  Discharge Condition: Stable  History of present illness: Patient presented with compressive high blood sugar as well as hypotension. She was found to be significantly hypertensive in the ER. Patient did have multiple episodes of vomiting the earlier today on the admission. She was also confused and lethargic at the time of my evaluation. With this she was admitted in the hospital for possible sepsis status secondary to aspiration pneumonia.  Hospital Course:  1. Sepsis [facility]) Patient was admitted with acute kidney injury, hypotension, confusion, leukocytosis with evidence of infection on the chest x-ray meeting criteria For sepsis. Initially she was started on ceftriaxone azithromycin but overnight she dropped her blood pressure and was changed to vancomycin and Zosyn She is on an antibiotic treatment. her culture remains negative the time of the discharge. She was admitted by physical therapy as well as ambulating in the hallway herself. She was felt safe to be discharged home on levofloxacin  2.Type 2 diabetes mellitus (HCC) Blood sugar well controlled. Patient will be discharged home insulin  3 acute encephalopathy. Currently patient is at her  baseline Most secondary to sepsis. CT of the head is unremarkable. Resuming her home psychotropic medications.  4 essential hypertension. Patient is on carvedilol, Cardizem, lisinopril and hydrochlorothiazide at home. Blood pressure is initially low but later on was elevated. Her Coreg was resumed at the time of the discharge Cardizem was also resumed. She would hold her lisinopril and hydrochlorothiazide until seen by PCP.  5 GERD Continue PPI  6 Hyperkalemia  Acute kidney injury ([place]) Most likely in the setting of acute infection as well as nausea and vomiting. Currently resolved.  7 Nausea with vomiting Possibly viral gastroenteritis. Currently does not have any nausea or vomiting.  8 Diabetic neuropathy ([place]) Continuing gabapentin  Procedures and Results:  None   Consultations:  None  Discharge Exam: Filed Weights   03/31/15 0500 04/01/15 0400 04/02/15 0548  Weight: 79.3 kg (174 lb 13.2 oz) 78.8 kg (173 lb 11.6 oz) 75.2 kg (165 lb 12.6 oz)   Filed Vitals:   04/02/15 0548  BP: 158/82  Pulse: 86  Temp: 98.7 F (37.1 C)  Resp: 20    General: Alert awake and oriented 3 Cardiovascular: S1 and S2 present Respiratory: Clear to auscultation  DISCHARGE MEDICATION: Discharge Instructions    Diet - low sodium heart healthy    Complete by:  As directed      Increase activity slowly    Complete by:  As directed           Current Discharge Medication List    START taking these medications   Details  levofloxacin (LEVAQUIN) 500 MG tablet Take 1 tablet (500 mg total) by mouth daily. Qty: 7 tablet, Refills: 0    saccharomyces boulardii (  FLORASTOR) 250 MG capsule Take 1 capsule (250 mg total) by mouth 2 (two) times daily. Qty: 14 capsule, Refills: 0      CONTINUE these medications which have NOT CHANGED   Details  carvedilol (COREG) 12.5 MG tablet Take 12.5 mg by mouth 2 (two) times daily with a meal.    citalopram (CELEXA) 40 MG tablet Take 40 mg  by mouth daily.    dexlansoprazole (DEXILANT) 60 MG capsule Take 60 mg by mouth daily.    diltiazem (CARDIZEM) 30 MG tablet Take 30-60 mg by mouth 2 (two) times daily. 60 mg in the morning and 30mg  in the evening    gabapentin (NEURONTIN) 600 MG tablet Take 600-1,200 mg by mouth 2 (two) times daily. 1 cap in the morning and 2 cap at night    gemfibrozil (LOPID) 600 MG tablet Take 600 mg by mouth 2 (two) times daily before a meal.    glipiZIDE (GLUCOTROL) 5 MG tablet Take 5 mg by mouth 2 (two) times daily before a meal.    HYDROcodone-acetaminophen (NORCO/VICODIN) 5-325 MG per tablet Take 1 tablet by mouth every 6 (six) hours as needed for severe pain. Qty: 20 tablet, Refills: 0    meloxicam (MOBIC) 15 MG tablet Take 15 mg by mouth daily.    pravastatin (PRAVACHOL) 20 MG tablet Take 20 mg by mouth at bedtime.    pregabalin (LYRICA) 75 MG capsule Take 75 mg by mouth 3 (three) times daily.    rOPINIRole (REQUIP) 2 MG tablet Take 2 mg by mouth at bedtime.    traMADol (ULTRAM) 50 MG tablet Take 50 mg by mouth every 6 (six) hours as needed for moderate pain.      STOP taking these medications     lisinopril-hydrochlorothiazide (PRINZIDE,ZESTORETIC) 20-12.5 MG per tablet        Allergies  Allergen Reactions  . Amlodipine Besylate     REACTION: swelling   Follow-up Information    Follow up with AVBUERE,EDWIN A, MD. Schedule an appointment as soon as possible for a visit in 1 week.   Specialty:  Internal Medicine   Why:  hospital follow up, sepsis   Contact information:   [place] [id number] [phone]       The results of significant diagnostics from this hospitalization (including imaging, microbiology, ancillary and laboratory) are listed below for reference.    Significant Diagnostic Studies: Ct Head Wo Contrast  03/31/2015  CLINICAL DATA:  Episode of hyperglycemia and lethargy. Acute encephalopathy EXAM: CT HEAD WITHOUT CONTRAST TECHNIQUE:  Contiguous axial images were obtained from the base of the skull through the vertex without intravenous contrast. COMPARISON:  12/12/2009 FINDINGS: Skull and Sinuses:Negative for fracture or destructive process. Mild mucosal edema in ethmoids and right sphenoid sinuses. Orbits: No acute abnormality. Brain: No evidence of acute infarction, hemorrhage, hydrocephalus, or mass lesion/mass effect. Stable pattern of low-density in the bilateral cerebral white matter, greatest in the biparietal and periventricular frontal regions. Appearance consistent with moderate chronic small vessel disease in this patient with history of hypertension and diabetes. IMPRESSION: 1. No acute finding or change from 2011. 2. Chronic small vessel disease. Electronically Signed   By: Monte Fantasia M.D.   On: 03/31/2015 03:19   Dg Chest Port 1 View  03/30/2015  CLINICAL DATA:  Hypotension and weakness. EXAM: PORTABLE CHEST 1 VIEW COMPARISON:  01/01/2015 and 09/06/2013 FINDINGS: Patient rotated to the left. Lungs are hypoinflated with stable elevation of the right hemidiaphragm. New airspace process over the  right lung suggesting a pneumonia with associated linear atelectasis in the right base. Cardiomediastinal silhouette is within normal. There is calcified plaque over the thoracic aorta. There are mild degenerative changes of the spine. IMPRESSION: Linear atelectasis right base. New airspace process over the right midlung likely a pneumonia. Electronically Signed   By: Marin Olp M.D.   On: 03/30/2015 16:05    Microbiology: Recent Results (from the past 240 hour(s))  Blood culture (routine x 2)     Status: None (Preliminary result)   Collection Time: 03/30/15  4:20 PM  Result Value Ref Range Status   Specimen Description BLOOD RIGHT WRIST  5 ML IN [facility] BOTTLE  Final   Special Requests NONE  Final   Culture   Final    NO GROWTH 1 DAY Performed at [facility]    Report Status PENDING  Incomplete  Blood culture  (routine x 2)     Status: None (Preliminary result)   Collection Time: 03/30/15  4:30 PM  Result Value Ref Range Status   Specimen Description BLOOD LEFT ARM  5 ML IN [facility] BOTTLE  Final   Special Requests NONE  Final   Culture   Final    NO GROWTH 1 DAY Performed at [facility]    Report Status PENDING  Incomplete  MRSA PCR Screening     Status: None   Collection Time: 03/30/15  6:15 PM  Result Value Ref Range Status   MRSA by PCR NEGATIVE NEGATIVE Final    Comment:        The GeneXpert MRSA Assay (FDA approved for NASAL specimens only), is one component of a comprehensive MRSA colonization surveillance program. It is not intended to diagnose MRSA infection nor to guide or monitor treatment for MRSA infections.      Labs: Basic Metabolic Panel:  Recent Labs Lab 03/30/15 1458 03/30/15 1825 03/31/15 0345 04/01/15 0340 04/02/15 0600  NA 137 138 140 141 139  K 6.1* 5.4* 4.5 4.4 4.0  CL 107 110 115* 115* 110  CO2 22 21* 20* 20* 21*  GLUCOSE 156* 105* 79 132* 158*  BUN 40* 39* 38* 20 11  CREATININE 1.72* 1.39* 1.28* 0.94 0.68  CALCIUM 9.4 8.6* 8.4* 9.0 9.4   Liver Function Tests:  Recent Labs Lab 03/30/15 1458 03/30/15 1825 03/31/15 0345 04/01/15 0340 04/02/15 0600  AST 18 15 15  13* 14*  ALT 12* 12* 11* 12* 14  ALKPHOS 82 67 60 68 64  BILITOT 1.1 0.6 0.7 0.8 0.8  PROT 6.8 5.6* 5.4* 6.0* 6.4*  ALBUMIN 3.4* 2.7* 2.7* 2.9* 3.0*   No results for input(s): LIPASE, AMYLASE in the last 168 hours. No results for input(s): AMMONIA in the last 168 hours. CBC:  Recent Labs Lab 03/30/15 1458 03/30/15 1825 03/31/15 0345 04/01/15 0340 04/02/15 0600  WBC 14.7* 10.7* 8.3 5.8 4.6  NEUTROABS  --  7.3 5.6 3.7 2.5  HGB 11.1* 9.3* 8.7* 8.7* 9.4*  HCT 32.4* 27.4* 25.7* 26.1* 27.5*  MCV 91.5 91.9 93.5 90.9 88.7  PLT 224 137* 127* 149* 167   Cardiac Enzymes: No results for input(s): CKTOTAL, CKMB, CKMBINDEX, TROPONINI in the last 168 hours. BNP: BNP (last  3 results) No results for input(s): BNP in the last 8760 hours.  ProBNP (last 3 results) No results for input(s): PROBNP in the last 8760 hours.  CBG:  Recent Labs Lab 04/01/15 1208 04/01/15 1659 04/01/15 2209 04/02/15 0734 04/02/15 1233  GLUCAP 128* 120* 186* 125* 136*  SignedBerle Mull  [facility] 04/02/2015, 1:29 PM

## 2015-04-02 NOTE — Discharge Instructions (Signed)
Discharge Instructions: Discharge Diagnoses: CAP  Recommendations for Outpatient Follow-up:  1. Please follow up with your PCP for resuming one of your blood pressure medication lisinopril-HCTZ.  Medications and dosages:HOLD : LISINOPRIL-HCTZ FOR 1 WEEK  Diet recommendation: low salt diet  You were cared for by a hospitalist during your hospital stay. If you have any questions about your discharge medications or the care you received while you were in the hospital after you are discharged, you can call the unit and asked to speak with the hospitalist on call if the hospitalist that took care of you is not available. Once you are discharged, your primary care physician will handle any further medical issues. Please note that NO REFILLS for any discharge medications will be authorized once you are discharged, as it is imperative that you return to your primary care physician (or establish a relationship with a primary care physician if you do not have one) for your aftercare needs so that they can reassess your need for medications and monitor your lab values.

## 2015-04-02 NOTE — Evaluation (Signed)
Physical Therapy Evaluation Patient Details Name: Chloe Harding MRN: [id number] DOB: [birth year] Today's Date: 04/02/2015   History of Present Illness  69 yo female admittd with sepsis, pna, confusion, hypotension. Hx of HTN, DM, diabetic neuropathy, CKD, mood d/o.  Clinical Impression  On eval, pt required supervision-Min guard assist for mobility-walked ~250 feet. Unsteady at times and pt had 1 LOB while performing balance assessment task (picking up object from floor). Instructed pt to use assistive device (cane or rollator) if needed and to slowly return to activity. Will follow during hospital stay. Do not anticipate any follow up PT needs at discharge.     Follow Up Recommendations No PT follow up;Supervision for mobility/OOB    Equipment Recommendations  None recommended by PT    Recommendations for Other Services       Precautions / Restrictions Precautions Precautions: Fall Restrictions Weight Bearing Restrictions: No      Mobility  Bed Mobility Overal bed mobility: Modified Independent                Transfers Overall transfer level: Modified independent                  Ambulation/Gait Ambulation/Gait assistance: Min guard;Supervision Ambulation Distance (Feet): 250 Feet Assistive device: None Gait Pattern/deviations: Step-through pattern;Staggering right;Drifts right/left;Staggering left     General Gait Details: unsteady at times. shorter distances-supervision, longer distances-Min guard. Pt tolerated distance well  Stairs Stairs: Yes Stairs assistance: Min guard Stair Management: Step to pattern;Forwards;Two rails Number of Stairs: 4 General stair comments: close guard for safety.   Wheelchair Mobility    Modified Rankin (Stroke Patients Only)       Balance Overall balance assessment: Needs assistance             Standing balance comment: Pt perfomred static standing EO/ EC, narrow BOS, with standing external  perturbation-supervision. Min guard for 360 degree turn. Min assist for picking up object from floor                             Pertinent Vitals/Pain Pain Assessment: No/denies pain    Home Living Family/patient expects to be discharged to:: Private residence Living Arrangements: Spouse/significant other   Type of Home: House Home Access: Stairs to enter Entrance Stairs-Rails: Right Entrance Stairs-Number of Steps: 5 Home Layout: One level Home Equipment: [place] - 4 wheels;Walker - 2 wheels;Cane - single point      Prior Function Level of Independence: Independent               Hand Dominance        Extremity/Trunk Assessment   Upper Extremity Assessment: Overall WFL for tasks assessed           Lower Extremity Assessment: Generalized weakness      Cervical / Trunk Assessment: Normal  Communication   Communication: No difficulties  Cognition Arousal/Alertness: Awake/alert Behavior During Therapy: WFL for tasks assessed/performed Overall Cognitive Status: Within Functional Limits for tasks assessed                      General Comments      Exercises        Assessment/Plan    PT Assessment Patient needs continued PT services  PT Diagnosis Difficulty walking   PT Problem List Decreased mobility;Decreased balance  PT Treatment Interventions Gait training;Functional mobility training;Therapeutic activities;Balance training;Therapeutic exercise   PT Goals (Current goals can be found in  the Care Plan section) Acute Rehab PT Goals Patient Stated Goal: home today! PT Goal Formulation: With patient Time For Goal Achievement: 04/16/15 Potential to Achieve Goals: Good    Frequency Min 3X/week   Barriers to discharge        Co-evaluation               End of Session Equipment Utilized During Treatment: Gait belt Activity Tolerance: Patient tolerated treatment well Patient left: in chair;with call bell/phone within  reach (RN okay with chair alarm off)           Time: [id number] PT Time Calculation (min) (ACUTE ONLY): 15 min   Charges:   PT Evaluation $Initial PT Evaluation Tier I: 1 Procedure     PT G Codes:        Weston Anna, MPT Pager: [phone]

## 2015-04-05 LAB — CULTURE, BLOOD (ROUTINE X 2)
Culture: NO GROWTH
Culture: NO GROWTH

## 2015-12-29 ENCOUNTER — Emergency Department (HOSPITAL_COMMUNITY): Payer: Medicare HMO

## 2015-12-29 ENCOUNTER — Emergency Department (HOSPITAL_COMMUNITY)
Admission: EM | Admit: 2015-12-29 | Discharge: 2015-12-29 | Disposition: A | Discharge status: Home / Self Care | Payer: Medicare HMO | Attending: Emergency Medicine | Admitting: Emergency Medicine

## 2015-12-29 ENCOUNTER — Encounter (HOSPITAL_COMMUNITY): Payer: Self-pay | Admitting: Emergency Medicine

## 2015-12-29 DIAGNOSIS — I129 Hypertensive chronic kidney disease with stage 1 through stage 4 chronic kidney disease, or unspecified chronic kidney disease: Secondary | ICD-10-CM | POA: Insufficient documentation

## 2015-12-29 DIAGNOSIS — Z7982 Long term (current) use of aspirin: Secondary | ICD-10-CM | POA: Diagnosis not present

## 2015-12-29 DIAGNOSIS — N181 Chronic kidney disease, stage 1: Secondary | ICD-10-CM | POA: Insufficient documentation

## 2015-12-29 DIAGNOSIS — W1839XA Other fall on same level, initial encounter: Secondary | ICD-10-CM | POA: Insufficient documentation

## 2015-12-29 DIAGNOSIS — M25511 Pain in right shoulder: Secondary | ICD-10-CM | POA: Diagnosis not present

## 2015-12-29 DIAGNOSIS — Y939 Activity, unspecified: Secondary | ICD-10-CM | POA: Diagnosis not present

## 2015-12-29 DIAGNOSIS — Z791 Long term (current) use of non-steroidal anti-inflammatories (NSAID): Secondary | ICD-10-CM | POA: Insufficient documentation

## 2015-12-29 DIAGNOSIS — E1122 Type 2 diabetes mellitus with diabetic chronic kidney disease: Secondary | ICD-10-CM | POA: Insufficient documentation

## 2015-12-29 DIAGNOSIS — Z79899 Other long term (current) drug therapy: Secondary | ICD-10-CM | POA: Diagnosis not present

## 2015-12-29 DIAGNOSIS — Y929 Unspecified place or not applicable: Secondary | ICD-10-CM | POA: Diagnosis not present

## 2015-12-29 DIAGNOSIS — R0789 Other chest pain: Secondary | ICD-10-CM | POA: Insufficient documentation

## 2015-12-29 DIAGNOSIS — Z7984 Long term (current) use of oral hypoglycemic drugs: Secondary | ICD-10-CM | POA: Insufficient documentation

## 2015-12-29 DIAGNOSIS — Y999 Unspecified external cause status: Secondary | ICD-10-CM | POA: Diagnosis not present

## 2015-12-29 DIAGNOSIS — W19XXXA Unspecified fall, initial encounter: Secondary | ICD-10-CM

## 2015-12-29 LAB — I-STAT CHEM 8, ED
BUN: 32 mg/dL — ABNORMAL HIGH (ref 6–20)
CALCIUM ION: 1.3 mmol/L — AB (ref 1.12–1.23)
Chloride: 107 mmol/L (ref 101–111)
Creatinine, Ser: 1 mg/dL (ref 0.44–1.00)
GLUCOSE: 180 mg/dL — AB (ref 65–99)
HCT: 40 % (ref 36.0–46.0)
HEMOGLOBIN: 13.6 g/dL (ref 12.0–15.0)
POTASSIUM: 4.4 mmol/L (ref 3.5–5.1)
Sodium: 140 mmol/L (ref 135–145)
TCO2: 24 mmol/L (ref 0–100)

## 2015-12-29 MED ORDER — HYDROCODONE-ACETAMINOPHEN 5-325 MG PO TABS
1.0000 | ORAL_TABLET | Freq: Once | ORAL | Status: AC
  Administered 2015-12-29: 1 via ORAL
  Filled 2015-12-29: qty 1

## 2015-12-29 MED ORDER — SENNOSIDES-DOCUSATE SODIUM 8.6-50 MG PO TABS: 1.0000 | ORAL_TABLET | Freq: Every day | ORAL | 0 refills | Status: DC

## 2015-12-29 MED ORDER — HYDROCODONE-ACETAMINOPHEN 5-325 MG PO TABS: 2.0000 | ORAL_TABLET | ORAL | 0 refills | Status: DC | PRN

## 2015-12-29 NOTE — ED Triage Notes (Signed)
Pt fell 3 days ago while cleaning. Witness by 70 year old boy who was helping cleaning. Boy stated that he thought the pt had passed out by the way she fell. Pt stated that she did hit her head in the front because she fell face first. Pt complains on right sided rib pain, pt is unable to raise right arm without pain, Pt states that she is unable to take a deep breath. Pt O2 is 97%.

## 2015-12-29 NOTE — ED Triage Notes (Signed)
Pt states she has abrasions to the right elbow. She currently has it bandage up now.

## 2015-12-29 NOTE — ED Triage Notes (Signed)
Pt stated that she was able to continue cleaning after she fell.

## 2015-12-29 NOTE — Discharge Instructions (Signed)

## 2015-12-29 NOTE — ED Provider Notes (Signed)
Emergency Department Provider Note   I have reviewed the triage vital signs and the nursing notes.   HISTORY  Chief Complaint Fall   HPI Chloe Harding is a 70 y.o. female with PMH of depression, DM, HLD, HTN is to the emergency department for evaluation of right-sided chest and shoulder pain in the setting of fall 5 days ago. Patient states she was cleaning an apartment when she suddenly felt herself falling to the floor. She does not believe she lost consciousness. She did hit her head and face on the ground and put her arms out to catch herself. She sustained a skin tear injury to the left elbow and has had some pain in the right knee that seems to be resolving. She is on a baby aspirin daily but no other blood thinners. Denies neck or back pain. Today she was getting dressed and raised her right arm when she felt severe pain in the right side of her chest and shoulder. No associated dyspnea. She has pain with deep breathing which has persisted since the fall. No fevers or productive cough.   Past Medical History:  Diagnosis Date  . Arthritis   . Depression   . Diabetes mellitus   . Hiatal hernia   . Hypercholesteremia   . Hypertension   . Reflux   . Renal disorder    shutting down 4 years ago    Patient Active Problem List   Diagnosis Date Noted  . Hypotension 03/30/2015  . Sepsis ([place]) 03/30/2015  . CAP (community acquired pneumonia) 03/30/2015  . Acute encephalopathy 03/30/2015  . Hyperkalemia 03/30/2015  . Acute kidney injury ([place]) 03/30/2015  . Nausea with vomiting 03/30/2015  . Diabetic neuropathy ([place]) 03/30/2015  . OBESITY 03/11/2010  . Type 2 diabetes mellitus ([place]) 12/26/2009  . Dyslipidemia 12/26/2009  . ANEMIA 12/26/2009  . DEPRESSION 12/26/2009  . HYPERTENSION, BENIGN ESSENTIAL 12/26/2009  . GERD 12/26/2009  . HIATAL HERNIA 12/26/2009  . CHRONIC KIDNEY DISEASE STAGE I 12/26/2009  . UTI 12/26/2009  . ARTHRITIS 12/26/2009  . ARTHROSCOPY, RIGHT KNEE,  HX OF 12/26/2009    Past Surgical History:  Procedure Laterality Date  . CHOLECYSTECTOMY      Current Outpatient Rx  . Order #: [id number] Class: Historical Med  . Order #: [id number] Class: Historical Med  . Order #: [id number] Class: Historical Med  . Order #: [id number] Class: Historical Med  . Order #: [id number] Class: Historical Med  . Order #: [id number] Class: Historical Med  . Order #: [id number] Class: Historical Med  . Order #: [id number] Class: Historical Med  . Order #: [id number] Class: Historical Med  . Order #: [id number] Class: Historical Med  . Order #: [id number] Class: Historical Med  . Order #: [id number] Class: Historical Med  . Order #: [id number] Class: Print  . Order #: [id number] Class: Print  . Order #: [id number] Class: Print    Allergies Amlodipine besylate  No family history on file.  Social History Social History  Substance Use Topics  . Smoking status: Never Smoker  . Smokeless tobacco: Never Used  . Alcohol use No    Review of Systems  Constitutional: No fever/chills Eyes: No visual changes. ENT: No sore throat. Cardiovascular: Denies chest pain. Positive right chest wall pain.  Respiratory: Denies shortness of breath. Positive pain with deep breathing.  Gastrointestinal: No abdominal pain.  No nausea, no vomiting.  No diarrhea.  No constipation. Genitourinary: Negative for dysuria. Musculoskeletal: Negative for back pain. Positive right shoulder pain.  Skin: Negative for rash. Neurological: Negative for headaches, focal weakness or  numbness.  10-point ROS otherwise negative.  ____________________________________________   PHYSICAL EXAM:  VITAL SIGNS: ED Triage Vitals  Enc Vitals Group     BP 12/29/15 0838 180/85     Pulse Rate 12/29/15 0838 66     Resp 12/29/15 0838 15     Temp 12/29/15 0838 97.9 F (36.6 C)     Temp Source 12/29/15 0828 Oral     SpO2 12/29/15 0838 97 %     Weight 12/29/15 0839 170 lb (77.1 kg)     Height 12/29/15 0839 5\' 1"  (1.549  m)     Pain Score 12/29/15 0841 10   Constitutional: Alert and oriented. Well appearing and in no acute distress. Eyes: Conjunctivae are normal. PERRL.  Head: Atraumatic. Nose: No congestion/rhinnorhea. Mouth/Throat: Mucous membranes are moist.  Oropharynx non-erythematous. Neck: No stridor.  No meningeal signs. No cervical spine tenderness to palpation. Cardiovascular: Normal rate, regular rhythm. Good peripheral circulation. Grossly normal heart sounds.   Respiratory: Normal respiratory effort.  No retractions. Lungs CTAB. Gastrointestinal: Soft and nontender. No distention.  Musculoskeletal: Tenderness to palpation of the anterior right shoulder with limited range of motion. Tenderness to right lateral chest wall with no crepitus or obvious deformity. No bruising. Full ROM of bilateral hips, knees, and ankles.  Neurologic:  Normal speech and language. No gross focal neurologic deficits are appreciated.  Skin:  Skin is warm, dry and intact. Abrasion to left elbow.  Psychiatric: Mood and affect are normal. Speech and behavior are normal.  ____________________________________________   LABS (all labs ordered are listed, but only abnormal results are displayed)  Labs Reviewed  I-STAT CHEM 8, ED - Abnormal; Notable for the following:       Result Value   BUN 32 (*)    Glucose, Bld 180 (*)    Calcium, Ion 1.30 (*)    All other components within normal limits   ____________________________________________  EKG  Reviewed in MUSE. No STEMI.  ____________________________________________  RADIOLOGY  Dg Chest 2 View  Result Date: 12/29/2015 CLINICAL DATA:  Fall today; pain in right lower chest and ribs, top of right shoulder; no previous injury; no known cardiopulmonary problems; HTN; diabetic; non smoker EXAM: CHEST  2 VIEW COMPARISON:  03/30/2015 FINDINGS: Cardiac silhouette is normal in size. No mediastinal or hilar masses or evidence of adenopathy. Mild discoid atelectasis in the  left mid to lower lung. Minor linear atelectasis at the right lung base. Lungs otherwise clear. Elevated right hemidiaphragm, stable. No pleural effusion or pneumothorax. Bony thorax is demineralized but grossly intact. IMPRESSION: No acute cardiopulmonary disease. Electronically Signed   By: Lajean Manes M.D.   On: 12/29/2015 09:51   Dg Shoulder Right  Result Date: 12/29/2015 CLINICAL DATA:  Fall today; pain in right lower chest and ribs, top of right shoulder; no previous injury; no known cardiopulmonary problems; HTN; diabetic; non smoker EXAM: RIGHT SHOULDER - 2+ VIEW COMPARISON:  07/05/2007 FINDINGS: No fracture. The glenohumeral and AC joints are normally aligned. No bone lesion. The bones are diffusely demineralized. Soft tissues are unremarkable. IMPRESSION: No fracture or dislocation. Electronically Signed   By: Lajean Manes M.D.   On: 12/29/2015 09:52    ____________________________________________   PROCEDURES  Procedure(s) performed:   Procedures  None ____________________________________________   INITIAL IMPRESSION / ASSESSMENT AND PLAN / ED COURSE  Pertinent labs & imaging results that were available during my care of the patient were reviewed by me and considered in my medical decision making (see chart for details).  Patient presents to the emergency department for evaluation of right sided chest wall pain in the setting of fall 5 days ago. Patient has no tenderness palpation of the cervical spine. No obvious head trauma. Do not believe the patient requires CT scan of the head or neck at this time given 5 days since the injury. Patient does have right-sided chest wall tenderness over the ribs and some right anterior shoulder pain with limited range of motion. Will get plain films of the chest and right shoulder. Will get EKG and baseline labs given questionable etiology of the fall but patient suspects mechanical fall. No seizure like activity.   10:39 AM No obvious rib  fracture or other bony injury on chest x-ray or shoulder x-ray. No pneumothorax. Have ordered an incentive spirometer to be given in the emergency department. Discussed its use with the patient in detail. Discussed return precautions including with signs or symptoms of developing pneumonia. We'll send home with prescription for Vicodin. Discussed that this medicine contains Tylenol and that she should be cautious if taking additional Tylenol and should not exceed 4,000 mg per day. Will give stool softener while on Vicodin.  ____________________________________________  FINAL CLINICAL IMPRESSION(S) / ED DIAGNOSES  Final diagnoses:  Fall, initial encounter  Chest wall pain  Right shoulder pain     MEDICATIONS GIVEN DURING THIS VISIT:  Medications  HYDROcodone-acetaminophen (NORCO/VICODIN) 5-325 MG per tablet 1 tablet (1 tablet Oral Given 12/29/15 0929)     NEW OUTPATIENT MEDICATIONS STARTED DURING THIS VISIT:  New Prescriptions   HYDROCODONE-ACETAMINOPHEN (NORCO/VICODIN) 5-325 MG TABLET    Take 2 tablets by mouth every 4 (four) hours as needed.   SENNA-DOCUSATE (SENOKOT-S) 8.6-50 MG TABLET    Take 1 tablet by mouth daily.      Note:  This document was prepared using Dragon voice recognition software and may include unintentional dictation errors.  Nanda Quinton, MD Emergency Medicine   Margette Fast, MD 12/29/15 [phone]

## 2016-08-07 ENCOUNTER — Other Ambulatory Visit: Payer: Self-pay | Admitting: Internal Medicine

## 2016-08-07 DIAGNOSIS — E2839 Other primary ovarian failure: Secondary | ICD-10-CM

## 2017-02-26 ENCOUNTER — Emergency Department (HOSPITAL_COMMUNITY): Payer: Medicare HMO

## 2017-02-26 ENCOUNTER — Emergency Department (HOSPITAL_COMMUNITY)
Admission: EM | Admit: 2017-02-26 | Discharge: 2017-02-26 | Disposition: A | Discharge status: Home / Self Care | Payer: Medicare HMO | Attending: Emergency Medicine | Admitting: Emergency Medicine

## 2017-02-26 DIAGNOSIS — Z7982 Long term (current) use of aspirin: Secondary | ICD-10-CM | POA: Diagnosis not present

## 2017-02-26 DIAGNOSIS — E860 Dehydration: Secondary | ICD-10-CM | POA: Diagnosis not present

## 2017-02-26 DIAGNOSIS — N181 Chronic kidney disease, stage 1: Secondary | ICD-10-CM | POA: Insufficient documentation

## 2017-02-26 DIAGNOSIS — E1122 Type 2 diabetes mellitus with diabetic chronic kidney disease: Secondary | ICD-10-CM | POA: Insufficient documentation

## 2017-02-26 DIAGNOSIS — Z7984 Long term (current) use of oral hypoglycemic drugs: Secondary | ICD-10-CM | POA: Insufficient documentation

## 2017-02-26 DIAGNOSIS — M6281 Muscle weakness (generalized): Secondary | ICD-10-CM | POA: Diagnosis present

## 2017-02-26 DIAGNOSIS — I129 Hypertensive chronic kidney disease with stage 1 through stage 4 chronic kidney disease, or unspecified chronic kidney disease: Secondary | ICD-10-CM | POA: Diagnosis not present

## 2017-02-26 DIAGNOSIS — N390 Urinary tract infection, site not specified: Secondary | ICD-10-CM | POA: Diagnosis not present

## 2017-02-26 DIAGNOSIS — N289 Disorder of kidney and ureter, unspecified: Secondary | ICD-10-CM

## 2017-02-26 DIAGNOSIS — Z7902 Long term (current) use of antithrombotics/antiplatelets: Secondary | ICD-10-CM | POA: Diagnosis not present

## 2017-02-26 DIAGNOSIS — Z79899 Other long term (current) drug therapy: Secondary | ICD-10-CM | POA: Diagnosis not present

## 2017-02-26 LAB — URINALYSIS, ROUTINE W REFLEX MICROSCOPIC
Bilirubin Urine: NEGATIVE
Glucose, UA: NEGATIVE mg/dL
KETONES UR: NEGATIVE mg/dL
NITRITE: NEGATIVE
PROTEIN: NEGATIVE mg/dL
Specific Gravity, Urine: 1.006 (ref 1.005–1.030)
pH: 5 (ref 5.0–8.0)

## 2017-02-26 LAB — CBC WITH DIFFERENTIAL/PLATELET
BASOS ABS: 0 10*3/uL (ref 0.0–0.1)
BASOS PCT: 0 %
EOS PCT: 2 %
Eosinophils Absolute: 0.1 10*3/uL (ref 0.0–0.7)
HCT: 31.7 % — ABNORMAL LOW (ref 36.0–46.0)
Hemoglobin: 10.8 g/dL — ABNORMAL LOW (ref 12.0–15.0)
LYMPHS PCT: 35 %
Lymphs Abs: 2.5 10*3/uL (ref 0.7–4.0)
MCH: 31.3 pg (ref 26.0–34.0)
MCHC: 34.1 g/dL (ref 30.0–36.0)
MCV: 91.9 fL (ref 78.0–100.0)
MONO ABS: 0.5 10*3/uL (ref 0.1–1.0)
Monocytes Relative: 8 %
Neutro Abs: 4 10*3/uL (ref 1.7–7.7)
Neutrophils Relative %: 55 %
Platelets: 289 10*3/uL (ref 150–400)
RBC: 3.45 MIL/uL — AB (ref 3.87–5.11)
RDW: 13.9 % (ref 11.5–15.5)
WBC: 7.1 10*3/uL (ref 4.0–10.5)

## 2017-02-26 LAB — BASIC METABOLIC PANEL
Anion gap: 10 (ref 5–15)
BUN: 41 mg/dL — AB (ref 6–20)
CHLORIDE: 103 mmol/L (ref 101–111)
CO2: 22 mmol/L (ref 22–32)
Calcium: 9.1 mg/dL (ref 8.9–10.3)
Creatinine, Ser: 2.1 mg/dL — ABNORMAL HIGH (ref 0.44–1.00)
GFR calc Af Amer: 27 mL/min — ABNORMAL LOW (ref >60)
GFR calc non Af Amer: 23 mL/min — ABNORMAL LOW (ref >60)
GLUCOSE: 82 mg/dL (ref 65–99)
POTASSIUM: 4.4 mmol/L (ref 3.5–5.1)
Sodium: 135 mmol/L (ref 135–145)

## 2017-02-26 LAB — POC OCCULT BLOOD, ED: FECAL OCCULT BLD: NEGATIVE

## 2017-02-26 LAB — CBG MONITORING, ED: GLUCOSE-CAPILLARY: 116 mg/dL — AB (ref 65–99)

## 2017-02-26 MED ORDER — DEXTROSE 5 % IV SOLN
1.0000 g | Freq: Once | INTRAVENOUS | Status: AC
  Administered 2017-02-26: 1 g via INTRAVENOUS
  Filled 2017-02-26: qty 10

## 2017-02-26 MED ORDER — ACETAMINOPHEN 325 MG PO TABS
650.0000 mg | ORAL_TABLET | Freq: Once | ORAL | Status: AC
  Administered 2017-02-26: 650 mg via ORAL
  Filled 2017-02-26: qty 2

## 2017-02-26 MED ORDER — CEPHALEXIN 500 MG PO CAPS: 500.0000 mg | ORAL_CAPSULE | Freq: Four times a day (QID) | ORAL | 0 refills | Status: DC

## 2017-02-26 MED ORDER — SODIUM CHLORIDE 0.9 % IV BOLUS (SEPSIS)
1000.0000 mL | Freq: Once | INTRAVENOUS | Status: AC
  Administered 2017-02-26: 1000 mL via INTRAVENOUS

## 2017-02-26 NOTE — ED Triage Notes (Signed)
Transported by [facility] from home. Patient reports generalized weakness x 2 days, especially to bilateral lower extremities. Reports pain to left ankle, states that legs "gave out" and patient thinks she rolled her left ankle.

## 2017-02-26 NOTE — ED Notes (Signed)
ED Provider at bedside. 

## 2017-02-26 NOTE — ED Provider Notes (Signed)
[facility] DEPT Provider Note   CSN: [id number] Arrival date & time: 02/26/17  6834     History   Chief Complaint Chief Complaint  Patient presents with  . Extremity Weakness    HPI Chloe Harding is a 71 y.o. female.  The patient stood up this morning from bed, and fell because her legs were "wobbly."  She injured her left ankle in the fall.  She was transferred by EMS.  She has been constipated recently and not hungry, so has decreased oral intake.  She denies fever, chills, cough, shortness of breath, focal weakness or paresthesia.  No other recent illnesses.  There are no other known modifying factors.   HPI  Past Medical History:  Diagnosis Date  . Arthritis   . Depression   . Diabetes mellitus   . Hiatal hernia   . Hypercholesteremia   . Hypertension   . Reflux   . Renal disorder    shutting down 4 years ago    Patient Active Problem List   Diagnosis Date Noted  . Hypotension 03/30/2015  . Sepsis ([place]) 03/30/2015  . CAP (community acquired pneumonia) 03/30/2015  . Acute encephalopathy 03/30/2015  . Hyperkalemia 03/30/2015  . Acute kidney injury ([place]) 03/30/2015  . Nausea with vomiting 03/30/2015  . Diabetic neuropathy ([place]) 03/30/2015  . OBESITY 03/11/2010  . Type 2 diabetes mellitus ([place]) 12/26/2009  . Dyslipidemia 12/26/2009  . ANEMIA 12/26/2009  . DEPRESSION 12/26/2009  . HYPERTENSION, BENIGN ESSENTIAL 12/26/2009  . GERD 12/26/2009  . HIATAL HERNIA 12/26/2009  . CHRONIC KIDNEY DISEASE STAGE I 12/26/2009  . UTI 12/26/2009  . ARTHRITIS 12/26/2009  . ARTHROSCOPY, RIGHT KNEE, HX OF 12/26/2009    Past Surgical History:  Procedure Laterality Date  . CHOLECYSTECTOMY      OB History    No data available       Home Medications    Prior to Admission medications   Medication Sig Start Date End Date Taking? Authorizing Provider  aspirin 81 MG tablet Take 81 mg by mouth daily.   Yes [provider]  carvedilol (COREG) 12.5 MG  tablet Take 12.5 mg by mouth 2 (two) times daily with a meal.   Yes [provider]  citalopram (CELEXA) 40 MG tablet Take 40 mg by mouth daily.   Yes [provider]  dexlansoprazole (DEXILANT) 60 MG capsule Take 60 mg by mouth daily.   Yes [provider]  diltiazem (CARDIZEM) 30 MG tablet Take 30-60 mg by mouth 2 (two) times daily. 60 mg in the morning and 30mg  in the evening   Yes [provider]  gabapentin (NEURONTIN) 600 MG tablet Take 1,200 mg by mouth 3 (three) times daily. 1 cap in the morning and 2 cap at night    Yes [provider]  gemfibrozil (LOPID) 600 MG tablet Take 600 mg by mouth 2 (two) times daily before a meal.   Yes [provider]  glipiZIDE (GLUCOTROL) 5 MG tablet Take 5 mg by mouth 2 (two) times daily before a meal.   Yes [provider]  meloxicam (MOBIC) 15 MG tablet Take 15 mg by mouth daily.   Yes [provider]  pravastatin (PRAVACHOL) 20 MG tablet Take 40 mg by mouth at bedtime.    Yes [provider]  rOPINIRole (REQUIP) 2 MG tablet Take 2 mg by mouth at bedtime.   Yes [provider]  traMADol (ULTRAM) 50 MG tablet Take 50 mg by mouth every 6 (six)  hours as needed for moderate pain.   Yes [provider]  cephALEXin (KEFLEX) 500 MG capsule Take 1 capsule (500 mg total) by mouth 4 (four) times daily. 02/26/17   Daleen Bo, MD  HYDROcodone-acetaminophen (NORCO/VICODIN) 5-325 MG tablet Take 2 tablets by mouth every 4 (four) hours as needed. Patient not taking: Reported on 02/26/2017 12/29/15   Long, Wonda Olds, MD  saccharomyces boulardii (FLORASTOR) 250 MG capsule Take 1 capsule (250 mg total) by mouth 2 (two) times daily. Patient not taking: Reported on 12/29/2015 04/02/15   Lavina Hamman, MD  senna-docusate (SENOKOT-S) 8.6-50 MG tablet Take 1 tablet by mouth daily. Patient not taking: Reported on 02/26/2017 12/29/15   Long, Wonda Olds, MD    Family History No  family history on file.  Social History Social History  Substance Use Topics  . Smoking status: Never Smoker  . Smokeless tobacco: Never Used  . Alcohol use No     Allergies   Amlodipine besylate   Review of Systems Review of Systems  All other systems reviewed and are negative.    Physical Exam Updated Vital Signs BP 128/64 (BP Location: Left Arm)   Pulse 92   Temp (!) 100.4 F (38 C) (Oral)   Resp 18   SpO2 100%   Physical Exam  Constitutional: She is oriented to person, place, and time. She appears well-developed. No distress.  Elderly, frail  HENT:  Head: Normocephalic and atraumatic.  Eyes: Pupils are equal, round, and reactive to light. Conjunctivae and EOM are normal.  Neck: Normal range of motion and phonation normal. Neck supple.  Cardiovascular: Normal rate and regular rhythm.   Pulmonary/Chest: Effort normal and breath sounds normal. She exhibits no tenderness.  Abdominal: Soft. She exhibits no distension. There is tenderness (Diffuse, mild). There is no guarding.  Musculoskeletal: Normal range of motion.  Tender left ankle without deformity or swelling.  Neurological: She is alert and oriented to person, place, and time. No cranial nerve deficit. She exhibits normal muscle tone. Coordination normal.  Skin: Skin is warm and dry.  Psychiatric: She has a normal mood and affect. Her behavior is normal. Judgment and thought content normal.  Nursing note and vitals reviewed.    ED Treatments / Results  Labs (all labs ordered are listed, but only abnormal results are displayed) Labs Reviewed  URINALYSIS, ROUTINE W REFLEX MICROSCOPIC - Abnormal; Notable for the following:       Result Value   APPearance CLOUDY (*)    Hgb urine dipstick SMALL (*)    Leukocytes, UA LARGE (*)    Bacteria, UA RARE (*)    Squamous Epithelial / LPF 0-5 (*)    All other components within normal limits  CBC WITH DIFFERENTIAL/PLATELET - Abnormal; Notable for the following:     RBC 3.45 (*)    Hemoglobin 10.8 (*)    HCT 31.7 (*)    All other components within normal limits  BASIC METABOLIC PANEL - Abnormal; Notable for the following:    BUN 41 (*)    Creatinine, Ser 2.10 (*)    GFR calc non Af Amer 23 (*)    GFR calc Af Amer 27 (*)    All other components within normal limits  CBG MONITORING, ED - Abnormal; Notable for the following:    Glucose-Capillary 116 (*)    All other components within normal limits  POC OCCULT BLOOD, ED    EKG  EKG Interpretation None       Radiology  Dg Ankle Complete Left  Result Date: 02/26/2017 CLINICAL DATA:  Generalized bilat lower extremity weakness over last 2 days and thinks she rolled lt ankle while trying to stand at home complains of generalized pain to lt ankle, EXAM: LEFT ANKLE COMPLETE - 3+ VIEW COMPARISON:  None. FINDINGS: Generalized osteopenia. No acute fracture or dislocation. No lytic or sclerotic osseous lesion. Ankle mortise is intact. Small plantar calcaneal spur. No soft tissue abnormality. Peripheral vascular atherosclerotic disease. IMPRESSION: No acute osseous injury of the left ankle. Electronically Signed   By: Kathreen Devoid   On: 02/26/2017 09:51    Procedures Procedures (including critical care time)  Medications Ordered in ED Medications  sodium chloride 0.9 % bolus 1,000 mL (0 mLs Intravenous Stopped 02/26/17 1130)  cefTRIAXone (ROCEPHIN) 1 g in dextrose 5 % 50 mL IVPB (0 g Intravenous Stopped 02/26/17 1201)  acetaminophen (TYLENOL) tablet 650 mg (650 mg Oral Given 02/26/17 1415)     Initial Impression / Assessment and Plan / ED Course  I have reviewed the triage vital signs and the nursing notes.  Pertinent labs & imaging results that were available during my care of the patient were reviewed by me and considered in my medical decision making (see chart for details).  Clinical Course as of Feb 26 1742  Fri Feb 26, 2017  0957 Abnormal, consistent with urinary tract infection WBC, UA: TOO  NUMEROUS TO COUNT [EW]  0957 Elevated Creatinine: (!) 2.10 [EW]  0957 Low GFR, Est Non African American: (!) 23 [EW]  0957 Elevated, likely consistent with volume depletion BUN: (!) 41 [EW]  0957 Low Hemoglobin: (!) 10.8 [EW]    Clinical Course User Index [EW] Daleen Bo, MD   BUN  Date Value Ref Range Status  02/26/2017 41 (H) 6 - 20 mg/dL Final  12/29/2015 32 (H) 6 - 20 mg/dL Final  04/02/2015 11 6 - 20 mg/dL Final  04/01/2015 20 6 - 20 mg/dL Final   Creatinine, Ser  Date Value Ref Range Status  02/26/2017 2.10 (H) 0.44 - 1.00 mg/dL Final  12/29/2015 1.00 0.44 - 1.00 mg/dL Final  04/02/2015 0.68 0.44 - 1.00 mg/dL Final  04/01/2015 0.94 0.44 - 1.00 mg/dL Final    Hemoglobin  Date Value Ref Range Status  02/26/2017 10.8 (L) 12.0 - 15.0 g/dL Final  12/29/2015 13.6 12.0 - 15.0 g/dL Final  04/02/2015 9.4 (L) 12.0 - 15.0 g/dL Final  04/01/2015 8.7 (L) 12.0 - 15.0 g/dL Final    Patient Vitals for the past 24 hrs:  BP Temp Temp src Pulse Resp SpO2  02/26/17 1415 128/64 (!) 100.4 F (38 C) Oral 92 18 100 %  02/26/17 1253 116/62 99.9 F (37.7 C) Oral 92 17 97 %  02/26/17 1031 106/81 98.7 F (37.1 C) Oral 88 16 99 %  02/26/17 0808 128/62 99.6 F (37.6 C) Oral 85 16 99 %    -At discharge -reevaluation with update and discussion. After initial assessment and treatment, an updated evaluation reveals she is comfortable and tolerating some oral liquids.  She and her family members are comfortable with her going home at this time and will help to monitor her.Daleen Bo L     Final Clinical Impressions(s) / ED Diagnoses   Final diagnoses:  Urinary tract infection without hematuria, site unspecified  Dehydration  Renal insufficiency    Weakness secondary to urinary tract infection and dehydration.  Patient improved after initiation of treatment in the emergency department.  Doubt serious bacterial infection, metabolic instability  or vascular collapse.  Mild renal  insufficiency, which can be treated with aggressive oral fluid management at home.  Nursing Notes Reviewed/ Care Coordinated Applicable Imaging Reviewed Interpretation of Laboratory Data incorporated into ED treatment  The patient appears reasonably screened and/or stabilized for discharge and I doubt any other medical condition or other [facility] requiring further screening, evaluation, or treatment in the ED at this time prior to discharge.  Plan: Home Medications-continue usual medications; Home Treatments-rest, fluids, drink at least a quart and a half of water each day; return here if the recommended treatment, does not improve the symptoms; Recommended follow up-PCP checkup 1 week and as needed   New Prescriptions Discharge Medication List as of 02/26/2017  2:05 PM    START taking these medications   Details  cephALEXin (KEFLEX) 500 MG capsule Take 1 capsule (500 mg total) by mouth 4 (four) times daily., Starting Fri 02/26/2017, Print         Daleen Bo, MD 02/26/17 1745

## 2017-02-26 NOTE — ED Notes (Signed)
Bed: WA06 Expected date:  Expected time:  Means of arrival:  Comments: 71 yo weakness

## 2017-02-26 NOTE — ED Notes (Signed)
Family at bedside. 

## 2017-02-26 NOTE — ED Notes (Addendum)
Pt assisted to bathroom by RN. Pt stood and pivoted to wheelchair with assistance and yellow socks on. Pt assisted in bathroom by RN. RN waited until pt was done urinating and assisted back into wheelchair and assisted back to the bed.  Yellow fall risk arm band in place

## 2017-02-26 NOTE — ED Notes (Signed)
Patient transported to restroom in wheelchair, no further needs identified at this time. Will continue to monitor.

## 2017-02-26 NOTE — ED Notes (Signed)
Assisted patient to the restroom in a wheelchair. No further needs identified. Family at bedside and call bell within reach. Will continue to monitor.

## 2017-02-26 NOTE — ED Notes (Signed)
Patient reports left ankle pain. No obvious swelling/deformity noted. Good color, pulse and sensation to reported affected extremity.

## 2017-02-26 NOTE — ED Notes (Signed)
Pt assisted to the bathroom by RN. Pt seemed stronger when standing than previous attempt and did not require as much assistance. Pt stated "That was easier than before, but I still feel shaky"

## 2017-02-26 NOTE — Discharge Instructions (Signed)
The testing today, indicates that you are dehydrated and have a urinary tract infection.  Your kidney function has decreased probably by 50%, and the creatinine has doubled to 2.1.  It is important to drink at least a quart of water each day, but it would be better to drink a quart and a half each day.  Make sure you are getting plenty of rest, and eating a regular diet.  For pain, you can take Tylenol every 4 hours.  This can also help if you are having a fever.  Return here, if needed, for problems.

## 2017-03-19 ENCOUNTER — Emergency Department (HOSPITAL_COMMUNITY)
Admission: EM | Admit: 2017-03-19 | Discharge: 2017-03-20 | Disposition: A | Discharge status: Home / Self Care | Payer: Medicare HMO | Attending: Emergency Medicine | Admitting: Emergency Medicine

## 2017-03-19 ENCOUNTER — Emergency Department (HOSPITAL_COMMUNITY): Payer: Medicare HMO

## 2017-03-19 ENCOUNTER — Encounter (HOSPITAL_COMMUNITY): Payer: Self-pay | Admitting: Emergency Medicine

## 2017-03-19 DIAGNOSIS — N181 Chronic kidney disease, stage 1: Secondary | ICD-10-CM | POA: Insufficient documentation

## 2017-03-19 DIAGNOSIS — I129 Hypertensive chronic kidney disease with stage 1 through stage 4 chronic kidney disease, or unspecified chronic kidney disease: Secondary | ICD-10-CM | POA: Diagnosis not present

## 2017-03-19 DIAGNOSIS — Y929 Unspecified place or not applicable: Secondary | ICD-10-CM | POA: Diagnosis not present

## 2017-03-19 DIAGNOSIS — W19XXXA Unspecified fall, initial encounter: Secondary | ICD-10-CM | POA: Diagnosis not present

## 2017-03-19 DIAGNOSIS — Z7984 Long term (current) use of oral hypoglycemic drugs: Secondary | ICD-10-CM | POA: Diagnosis not present

## 2017-03-19 DIAGNOSIS — Y9301 Activity, walking, marching and hiking: Secondary | ICD-10-CM | POA: Insufficient documentation

## 2017-03-19 DIAGNOSIS — E1122 Type 2 diabetes mellitus with diabetic chronic kidney disease: Secondary | ICD-10-CM | POA: Diagnosis not present

## 2017-03-19 DIAGNOSIS — Z7982 Long term (current) use of aspirin: Secondary | ICD-10-CM | POA: Insufficient documentation

## 2017-03-19 DIAGNOSIS — I951 Orthostatic hypotension: Secondary | ICD-10-CM | POA: Insufficient documentation

## 2017-03-19 DIAGNOSIS — Z7902 Long term (current) use of antithrombotics/antiplatelets: Secondary | ICD-10-CM | POA: Diagnosis not present

## 2017-03-19 DIAGNOSIS — Y999 Unspecified external cause status: Secondary | ICD-10-CM | POA: Insufficient documentation

## 2017-03-19 DIAGNOSIS — R51 Headache: Secondary | ICD-10-CM | POA: Diagnosis present

## 2017-03-19 DIAGNOSIS — S0990XA Unspecified injury of head, initial encounter: Secondary | ICD-10-CM

## 2017-03-19 LAB — CBG MONITORING, ED: GLUCOSE-CAPILLARY: 168 mg/dL — AB (ref 65–99)

## 2017-03-19 NOTE — ED Triage Notes (Signed)
Patient had fall this morning in bathroom and EMS was called and checked patient out but wasn't transport for treatment.  Patient reports that she hit the back of her head and having pain but denies any LOC or on blood thinners.

## 2017-03-19 NOTE — ED Provider Notes (Signed)
[facility] DEPT Provider Note   CSN: [id number] Arrival date & time: 03/19/17  1732     History   Chief Complaint Chief Complaint  Patient presents with  . Fall  . Head Injury    HPI Chloe Harding is a 71 y.o. female.  Patient presents to the ED with a chief complaint of fall.  She states that when she was getting out of bed this morning, she stood up, became lightheaded and passed out.  This happened twice this morning.  She states that her blood sugar generally runs low in the morning.  She denies any complaints now, but family members wanted her to be evaluated.  She denies any chest pain, SOB, n/v/d, numbness, weakness, or tingling.  There are no other associated symptoms or modifying factors.  Family members report that she seem confused when they called her this morning, and it was difficult to understand what she was meaning to say.  She is back to baseline now per the family.   The history is provided by the patient. No language interpreter was used.    Past Medical History:  Diagnosis Date  . Arthritis   . Depression   . Diabetes mellitus   . Hiatal hernia   . Hypercholesteremia   . Hypertension   . Reflux   . Renal disorder    shutting down 4 years ago    Patient Active Problem List   Diagnosis Date Noted  . Hypotension 03/30/2015  . Sepsis ([place]) 03/30/2015  . CAP (community acquired pneumonia) 03/30/2015  . Acute encephalopathy 03/30/2015  . Hyperkalemia 03/30/2015  . Acute kidney injury ([place]) 03/30/2015  . Nausea with vomiting 03/30/2015  . Diabetic neuropathy ([place]) 03/30/2015  . OBESITY 03/11/2010  . Type 2 diabetes mellitus ([place]) 12/26/2009  . Dyslipidemia 12/26/2009  . ANEMIA 12/26/2009  . DEPRESSION 12/26/2009  . HYPERTENSION, BENIGN ESSENTIAL 12/26/2009  . GERD 12/26/2009  . HIATAL HERNIA 12/26/2009  . CHRONIC KIDNEY DISEASE STAGE I 12/26/2009  . UTI 12/26/2009  . ARTHRITIS 12/26/2009  . ARTHROSCOPY, RIGHT  KNEE, HX OF 12/26/2009    Past Surgical History:  Procedure Laterality Date  . CHOLECYSTECTOMY      OB History    No data available       Home Medications    Prior to Admission medications   Medication Sig Start Date End Date Taking? Authorizing Provider  aspirin 81 MG tablet Take 81 mg by mouth daily.   Yes [provider]  carvedilol (COREG) 12.5 MG tablet Take 12.5 mg by mouth 2 (two) times daily with a meal.   Yes [provider]  citalopram (CELEXA) 40 MG tablet Take 40 mg by mouth daily.   Yes [provider]  diltiazem (CARDIZEM) 30 MG tablet Take 30-60 mg by mouth 2 (two) times daily. 60 mg in the morning and 30mg  in the evening   Yes [provider]  gabapentin (NEURONTIN) 600 MG tablet Take 600-1,200 mg by mouth 3 (three) times daily. 600mg  by mouth in the morning and 1200mg  by mouth at bedtime   Yes [provider]  gemfibrozil (LOPID) 600 MG tablet Take 600 mg by mouth 2 (two) times daily before a meal.   Yes [provider]  glipiZIDE (GLUCOTROL) 5 MG tablet Take 5 mg by mouth 2 (two) times daily before a meal.   Yes [provider]  meloxicam (MOBIC) 15 MG tablet Take 15 mg by mouth daily.   Yes [provider]  pravastatin (PRAVACHOL) 20 MG tablet Take 40 mg by mouth at bedtime.    Yes [provider]  rOPINIRole (REQUIP) 2 MG tablet Take 2 mg by mouth at bedtime.   Yes [provider]  traMADol (ULTRAM) 50 MG tablet Take 50 mg by mouth every 6 (six) hours as needed for moderate pain.   Yes [provider]  cephALEXin (KEFLEX) 500 MG capsule Take 1 capsule (500 mg total) by mouth 4 (four) times daily. Patient not taking: Reported on 03/19/2017 02/26/17   Daleen Bo, MD  saccharomyces boulardii (FLORASTOR) 250 MG capsule Take 1 capsule (250 mg total) by mouth 2 (two) times daily. Patient not taking: Reported on 12/29/2015 04/02/15   Lavina Hamman, MD    Family  History No family history on file.  Social History Social History  Substance Use Topics  . Smoking status: Never Smoker  . Smokeless tobacco: Never Used  . Alcohol use No     Allergies   Amlodipine besylate   Review of Systems Review of Systems  All other systems reviewed and are negative.    Physical Exam Updated Vital Signs BP 112/77 (BP Location: Left Arm)   Pulse 70   Temp 98.2 F (36.8 C) (Oral)   Resp 18   Ht 5' 0.5" (1.537 m)   Wt 77.1 kg (170 lb)   SpO2 97%   BMI 32.65 kg/m   Physical Exam  Constitutional: She is oriented to person, place, and time. She appears well-developed and well-nourished.  HENT:  Head: Normocephalic and atraumatic.  No evidence of traumatic head injury  Eyes: Pupils are equal, round, and reactive to light. Conjunctivae and EOM are normal.  Neck: Normal range of motion. Neck supple.  Cardiovascular: Normal rate and regular rhythm.  Exam reveals no gallop and no friction rub.   No murmur heard. Pulmonary/Chest: Effort normal and breath sounds normal. No respiratory distress. She has no wheezes. She has no rales. She exhibits no tenderness.  Abdominal: Soft. Bowel sounds are normal. She exhibits no distension and no mass. There is no tenderness. There is no rebound and no guarding.  Musculoskeletal: Normal range of motion. She exhibits no edema or tenderness.  Neurological: She is alert and oriented to person, place, and time.  CN 3-12 intact Speech is clear Movements are goal oriented Alert to person and place Sensation and strength intact throughout  Skin: Skin is warm and dry.  No open wounds  Psychiatric: She has a normal mood and affect. Her behavior is normal. Judgment and thought content normal.  Nursing note and vitals reviewed.    ED Treatments / Results  Labs (all labs ordered are listed, but only abnormal results are displayed) Labs Reviewed  CBG MONITORING, ED - Abnormal; Notable for the following:       Result  Value   Glucose-Capillary 168 (*)    All other components within normal limits  CBC WITH DIFFERENTIAL/PLATELET  BASIC METABOLIC PANEL  I-STAT TROPONIN, ED    EKG  EKG Interpretation None       Radiology No results found.  Procedures Procedures (including critical care time)  Medications Ordered in ED Medications - No data to display   Initial Impression / Assessment and Plan / ED Course  I have reviewed the triage vital signs and the nursing notes.  Pertinent labs & imaging results that were available during my care of the patient were reviewed by me and considered in my medical decision making (  see chart for details).     Patient with syncopal episode.  Will check labs and imaging.  Patient did hit her head when she fell.  She is not anticoagulated.  She is neurovascularly intact.  She states her sugars run low in the AM.  I am most suspicious that hypoglycemia is the cause for her symptoms, this could also explain her confusion this morning.  TIA is considered, but thought to be less likely.  Could also have been orthostatic hypotension.  Initial orthostats are positive.  Resolved after fluids.  Cr is elevated, but is trending down from last visit.  PCP follow-up.  Patient seen by and discussed with Dr. Darl Householder, who agrees with discharge plan.  Final Clinical Impressions(s) / ED Diagnoses   Final diagnoses:  Fall, initial encounter  Injury of head, initial encounter  Orthostasis    New Prescriptions New Prescriptions   No medications on file     Montine Circle, Hershal Coria 03/20/17 0315    Drenda Freeze, MD 03/20/17 2303

## 2017-03-20 LAB — BASIC METABOLIC PANEL
ANION GAP: 10 (ref 5–15)
BUN: 21 mg/dL — ABNORMAL HIGH (ref 6–20)
CALCIUM: 9.6 mg/dL (ref 8.9–10.3)
CHLORIDE: 101 mmol/L (ref 101–111)
CO2: 26 mmol/L (ref 22–32)
Creatinine, Ser: 1.45 mg/dL — ABNORMAL HIGH (ref 0.44–1.00)
GFR calc non Af Amer: 36 mL/min — ABNORMAL LOW (ref >60)
GFR, EST AFRICAN AMERICAN: 42 mL/min — AB (ref >60)
Glucose, Bld: 150 mg/dL — ABNORMAL HIGH (ref 65–99)
Potassium: 3.2 mmol/L — ABNORMAL LOW (ref 3.5–5.1)
SODIUM: 137 mmol/L (ref 135–145)

## 2017-03-20 LAB — CBC WITH DIFFERENTIAL/PLATELET
BASOS ABS: 0 10*3/uL (ref 0.0–0.1)
BASOS PCT: 0 %
EOS ABS: 0.3 10*3/uL (ref 0.0–0.7)
Eosinophils Relative: 6 %
HEMATOCRIT: 31.1 % — AB (ref 36.0–46.0)
HEMOGLOBIN: 10.2 g/dL — AB (ref 12.0–15.0)
Lymphocytes Relative: 48 %
Lymphs Abs: 2.5 10*3/uL (ref 0.7–4.0)
MCH: 30.6 pg (ref 26.0–34.0)
MCHC: 32.8 g/dL (ref 30.0–36.0)
MCV: 93.4 fL (ref 78.0–100.0)
Monocytes Absolute: 0.3 10*3/uL (ref 0.1–1.0)
Monocytes Relative: 6 %
NEUTROS ABS: 2.1 10*3/uL (ref 1.7–7.7)
NEUTROS PCT: 40 %
Platelets: 266 10*3/uL (ref 150–400)
RBC: 3.33 MIL/uL — ABNORMAL LOW (ref 3.87–5.11)
RDW: 13.9 % (ref 11.5–15.5)
WBC: 5.3 10*3/uL (ref 4.0–10.5)

## 2017-03-20 LAB — CBG MONITORING, ED: Glucose-Capillary: 139 mg/dL — ABNORMAL HIGH (ref 65–99)

## 2017-03-20 LAB — I-STAT TROPONIN, ED: TROPONIN I, POC: 0 ng/mL (ref 0.00–0.08)

## 2017-03-20 MED ORDER — SODIUM CHLORIDE 0.9 % IV BOLUS (SEPSIS)
1000.0000 mL | Freq: Once | INTRAVENOUS | Status: AC
  Administered 2017-03-20: 1000 mL via INTRAVENOUS

## 2017-03-20 NOTE — ED Notes (Signed)
Pt husband, Liliane Channel called to pick up patient.

## 2017-03-20 NOTE — ED Notes (Signed)
Family at bedside. 

## 2017-03-20 NOTE — ED Notes (Signed)
Call Rick at [phone] when patient is being discharged

## 2017-03-20 NOTE — Discharge Instructions (Signed)
Your fall is thought to have been the result of your blood pressure being low when changing positions.  When moving from lying to sitting and from sitting to standing, please take 30 seconds to 1 minute to allow your body to adjust to the position changes.    Your CT scan also showed a pulmonary nodule.  This is a nonspecific finding.  You need to discuss this with your doctor.

## 2017-04-08 ENCOUNTER — Emergency Department (HOSPITAL_COMMUNITY): Payer: Medicare HMO

## 2017-04-08 ENCOUNTER — Emergency Department (HOSPITAL_COMMUNITY)
Admission: EM | Admit: 2017-04-08 | Discharge: 2017-04-08 | Disposition: A | Discharge status: Home / Self Care | Payer: Medicare HMO | Attending: Emergency Medicine | Admitting: Emergency Medicine

## 2017-04-08 ENCOUNTER — Encounter (HOSPITAL_COMMUNITY): Payer: Self-pay | Admitting: Emergency Medicine

## 2017-04-08 DIAGNOSIS — R05 Cough: Secondary | ICD-10-CM | POA: Diagnosis not present

## 2017-04-08 DIAGNOSIS — E162 Hypoglycemia, unspecified: Secondary | ICD-10-CM

## 2017-04-08 DIAGNOSIS — R4182 Altered mental status, unspecified: Secondary | ICD-10-CM | POA: Diagnosis present

## 2017-04-08 DIAGNOSIS — Z7982 Long term (current) use of aspirin: Secondary | ICD-10-CM | POA: Insufficient documentation

## 2017-04-08 DIAGNOSIS — E11649 Type 2 diabetes mellitus with hypoglycemia without coma: Secondary | ICD-10-CM | POA: Diagnosis not present

## 2017-04-08 DIAGNOSIS — M545 Low back pain: Secondary | ICD-10-CM | POA: Insufficient documentation

## 2017-04-08 DIAGNOSIS — Z79899 Other long term (current) drug therapy: Secondary | ICD-10-CM | POA: Insufficient documentation

## 2017-04-08 DIAGNOSIS — I10 Essential (primary) hypertension: Secondary | ICD-10-CM | POA: Diagnosis not present

## 2017-04-08 DIAGNOSIS — N390 Urinary tract infection, site not specified: Secondary | ICD-10-CM | POA: Diagnosis not present

## 2017-04-08 LAB — CBC WITH DIFFERENTIAL/PLATELET
BASOS PCT: 0 %
Basophils Absolute: 0 10*3/uL (ref 0.0–0.1)
EOS ABS: 0.3 10*3/uL (ref 0.0–0.7)
Eosinophils Relative: 4 %
HCT: 34.8 % — ABNORMAL LOW (ref 36.0–46.0)
Hemoglobin: 11.2 g/dL — ABNORMAL LOW (ref 12.0–15.0)
LYMPHS PCT: 41 %
Lymphs Abs: 2.9 10*3/uL (ref 0.7–4.0)
MCH: 29.4 pg (ref 26.0–34.0)
MCHC: 32.2 g/dL (ref 30.0–36.0)
MCV: 91.3 fL (ref 78.0–100.0)
Monocytes Absolute: 0.5 10*3/uL (ref 0.1–1.0)
Monocytes Relative: 7 %
Neutro Abs: 3.3 10*3/uL (ref 1.7–7.7)
Neutrophils Relative %: 48 %
Platelets: 363 10*3/uL (ref 150–400)
RBC: 3.81 MIL/uL — ABNORMAL LOW (ref 3.87–5.11)
RDW: 13.3 % (ref 11.5–15.5)
WBC: 7 10*3/uL (ref 4.0–10.5)

## 2017-04-08 LAB — I-STAT TROPONIN, ED: TROPONIN I, POC: 0 ng/mL (ref 0.00–0.08)

## 2017-04-08 LAB — CBG MONITORING, ED
GLUCOSE-CAPILLARY: 155 mg/dL — AB (ref 65–99)
Glucose-Capillary: 38 mg/dL — CL (ref 65–99)
Glucose-Capillary: 174 mg/dL — ABNORMAL HIGH (ref 65–99)

## 2017-04-08 LAB — COMPREHENSIVE METABOLIC PANEL
ALT: 7 U/L — ABNORMAL LOW (ref 14–54)
ANION GAP: 10 (ref 5–15)
AST: 24 U/L (ref 15–41)
Albumin: 2.6 g/dL — ABNORMAL LOW (ref 3.5–5.0)
Alkaline Phosphatase: 107 U/L (ref 38–126)
BILIRUBIN TOTAL: 0.6 mg/dL (ref 0.3–1.2)
BUN: 19 mg/dL (ref 6–20)
CALCIUM: 9.3 mg/dL (ref 8.9–10.3)
CO2: 27 mmol/L (ref 22–32)
Chloride: 102 mmol/L (ref 101–111)
Creatinine, Ser: 1.12 mg/dL — ABNORMAL HIGH (ref 0.44–1.00)
GFR calc Af Amer: 56 mL/min — ABNORMAL LOW (ref >60)
GFR, EST NON AFRICAN AMERICAN: 49 mL/min — AB (ref >60)
Glucose, Bld: 47 mg/dL — ABNORMAL LOW (ref 65–99)
POTASSIUM: 3.5 mmol/L (ref 3.5–5.1)
Sodium: 139 mmol/L (ref 135–145)
TOTAL PROTEIN: 6.7 g/dL (ref 6.5–8.1)

## 2017-04-08 LAB — URINALYSIS, ROUTINE W REFLEX MICROSCOPIC
BILIRUBIN URINE: NEGATIVE
Glucose, UA: NEGATIVE mg/dL
KETONES UR: NEGATIVE mg/dL
NITRITE: NEGATIVE
PH: 6 (ref 5.0–8.0)
Specific Gravity, Urine: 1.02 (ref 1.005–1.030)

## 2017-04-08 LAB — URINALYSIS, MICROSCOPIC (REFLEX): RBC / HPF: NONE SEEN RBC/hpf (ref 0–5)

## 2017-04-08 MED ORDER — DEXTROSE 50 % IV SOLN
1.0000 | Freq: Once | INTRAVENOUS | Status: AC
  Administered 2017-04-08: 50 mL via INTRAVENOUS

## 2017-04-08 MED ORDER — DEXTROSE 50 % IV SOLN
INTRAVENOUS | Status: AC
  Administered 2017-04-08: 50 mL via INTRAVENOUS
  Filled 2017-04-08: qty 50

## 2017-04-08 MED ORDER — DEXTROSE 5 % IV SOLN
1.0000 g | Freq: Once | INTRAVENOUS | Status: AC
  Administered 2017-04-08: 1 g via INTRAVENOUS
  Filled 2017-04-08: qty 10

## 2017-04-08 MED ORDER — CEPHALEXIN 500 MG PO CAPS: 500.0000 mg | ORAL_CAPSULE | Freq: Four times a day (QID) | ORAL | 0 refills | Status: DC

## 2017-04-08 MED ORDER — SODIUM CHLORIDE 0.9 % IV BOLUS (SEPSIS)
1000.0000 mL | Freq: Once | INTRAVENOUS | Status: AC
  Administered 2017-04-08: 1000 mL via INTRAVENOUS

## 2017-04-08 NOTE — ED Notes (Signed)
Patient transported to X-ray 

## 2017-04-08 NOTE — ED Notes (Signed)
ED Provider at bedside. 

## 2017-04-08 NOTE — ED Provider Notes (Signed)
[facility] DEPT Provider Note   CSN: [id number] Arrival date & time: 04/08/17  1341     History   Chief Complaint Chief Complaint  Patient presents with  . Altered Mental Status    HPI Chloe Harding is a 71 y.o. female with a past medical history of diabetes, hypertension, hyperlipidemia, who presents to ED for evaluation of the following complaints. Her family states that she suffered from a fall at the beginning of the month.  States that she was evaluated in the ED and a CT head and neck were done which were unremarkable.  They state that since the fall she feels as though her legs have been "shaking," she sometimes gets confused about which day of the week it is, decreased appetite, generalized weakness, some vomiting and complaining of back pain.  They are concerned because they feel that her blood pressure is "too low," that she has less energy and that she may suffer another they state that she has been ambulating normally but seems to have "too much rest."  They also state that she has been having a productive cough for the past week along with cold symptoms.  They report compliance with her medications daily.  They deny any additional injury or falls, fever, urinary issues, bowel changes, abdominal pain, headache, vision changes, prior back surgery, urinary incontinence.  HPI  Past Medical History:  Diagnosis Date  . Arthritis   . Depression   . Diabetes mellitus   . Hiatal hernia   . Hypercholesteremia   . Hypertension   . Reflux   . Renal disorder    shutting down 4 years ago    Patient Active Problem List   Diagnosis Date Noted  . Hypotension 03/30/2015  . Sepsis ([place]) 03/30/2015  . CAP (community acquired pneumonia) 03/30/2015  . Acute encephalopathy 03/30/2015  . Hyperkalemia 03/30/2015  . Acute kidney injury ([place]) 03/30/2015  . Nausea with vomiting 03/30/2015  . Diabetic neuropathy ([place]) 03/30/2015  . OBESITY 03/11/2010  .  Type 2 diabetes mellitus ([place]) 12/26/2009  . Dyslipidemia 12/26/2009  . ANEMIA 12/26/2009  . DEPRESSION 12/26/2009  . HYPERTENSION, BENIGN ESSENTIAL 12/26/2009  . GERD 12/26/2009  . HIATAL HERNIA 12/26/2009  . CHRONIC KIDNEY DISEASE STAGE I 12/26/2009  . UTI 12/26/2009  . ARTHRITIS 12/26/2009  . ARTHROSCOPY, RIGHT KNEE, HX OF 12/26/2009    Past Surgical History:  Procedure Laterality Date  . CHOLECYSTECTOMY      OB History    No data available       Home Medications    Prior to Admission medications   Medication Sig Start Date End Date Taking? Authorizing Provider  aspirin 81 MG tablet Take 81 mg by mouth daily.    [provider]  carvedilol (COREG) 12.5 MG tablet Take 12.5 mg by mouth 2 (two) times daily with a meal.    [provider]  cephALEXin (KEFLEX) 500 MG capsule Take 1 capsule (500 mg total) by mouth 4 (four) times daily. 04/08/17   Desree Leap, PA-C  citalopram (CELEXA) 40 MG tablet Take 40 mg by mouth daily.    [provider]  diltiazem (CARDIZEM) 30 MG tablet Take 30-60 mg by mouth 2 (two) times daily. 60 mg in the morning and 30mg  in the evening    [provider]  gabapentin (NEURONTIN) 600 MG tablet Take 600-1,200 mg by mouth 3 (three) times daily. 600mg  by mouth in the morning and 1200mg  by mouth at bedtime  [provider]  gemfibrozil (LOPID) 600 MG tablet Take 600 mg by mouth 2 (two) times daily before a meal.    [provider]  glipiZIDE (GLUCOTROL) 5 MG tablet Take 5 mg by mouth 2 (two) times daily before a meal.    [provider]  meloxicam (MOBIC) 15 MG tablet Take 15 mg by mouth daily.    [provider]  pravastatin (PRAVACHOL) 20 MG tablet Take 40 mg by mouth at bedtime.     [provider]  rOPINIRole (REQUIP) 2 MG tablet Take 2 mg by mouth at bedtime.    [provider]  saccharomyces boulardii (FLORASTOR) 250 MG capsule Take 1 capsule (250 mg total) by  mouth 2 (two) times daily. Patient not taking: Reported on 12/29/2015 04/02/15   Lavina Hamman, MD  traMADol (ULTRAM) 50 MG tablet Take 50 mg by mouth every 6 (six) hours as needed for moderate pain.    [provider]    Family History History reviewed. No pertinent family history.  Social History Social History   Tobacco Use  . Smoking status: Never Smoker  . Smokeless tobacco: Never Used  Substance Use Topics  . Alcohol use: No  . Drug use: No     Allergies   Amlodipine besylate   Review of Systems Review of Systems  Constitutional: Positive for activity change, appetite change and fatigue. Negative for chills and fever.  HENT: Positive for congestion. Negative for ear pain, rhinorrhea, sneezing and sore throat.   Eyes: Negative for photophobia and visual disturbance.  Respiratory: Positive for cough. Negative for chest tightness, shortness of breath and wheezing.   Cardiovascular: Negative for chest pain and palpitations.  Gastrointestinal: Positive for nausea and vomiting. Negative for abdominal pain, blood in stool, constipation and diarrhea.  Genitourinary: Negative for dysuria, hematuria and urgency.  Musculoskeletal: Positive for back pain. Negative for myalgias.  Skin: Negative for rash.  Neurological: Negative for dizziness, weakness, light-headedness and headaches.  Psychiatric/Behavioral: Positive for confusion.     Physical Exam Updated Vital Signs BP 124/76   Pulse 70   Temp 98.1 F (36.7 C) (Oral)   Resp 15   SpO2 95%   Physical Exam  Constitutional: She appears well-developed and well-nourished. No distress.  HENT:  Head: Normocephalic and atraumatic.  Nose: Nose normal.  Eyes: Conjunctivae and EOM are normal. Pupils are equal, round, and reactive to light. Right eye exhibits no discharge. Left eye exhibits no discharge. No scleral icterus.  Neck: Normal range of motion. Neck supple.  Cardiovascular: Normal rate, regular rhythm,  normal heart sounds and intact distal pulses. Exam reveals no gallop and no friction rub.  No murmur heard. Pulmonary/Chest: Effort normal and breath sounds normal. No respiratory distress.  Abdominal: Soft. Bowel sounds are normal. She exhibits no distension. There is no tenderness. There is no guarding.  Musculoskeletal: Normal range of motion. She exhibits tenderness. She exhibits no edema.       Arms: No midline spinal tenderness present in thoracic or cervical spine. No step-off palpated. No visible bruising, edema or temperature change noted. No objective signs of numbness present. No saddle anesthesia. 2+ DP pulses bilaterally. Sensation intact to light touch. Strength 5/5 in bilateral lower extremities.  Neurological: She is alert. No cranial nerve deficit or sensory deficit. She exhibits normal muscle tone. Coordination normal.  Pupils reactive. No facial asymmetry noted. Cranial nerves appear grossly intact. Sensation intact to light touch on face, BUE and BLE. Strength 5/5 in BUE  and BLE. Normal finger to nose coordination.  Alert and oriented to person, place, month and year.  However she does believe that it is Wednesday, the day before Thanksgiving when it is actually Thursday, the day of Thanksgiving.  She is able to recognize family members in the room.  Skin: Skin is warm and dry. No rash noted.  Psychiatric: She has a normal mood and affect.  Nursing note and vitals reviewed.    ED Treatments / Results  Labs (all labs ordered are listed, but only abnormal results are displayed) Labs Reviewed  COMPREHENSIVE METABOLIC PANEL - Abnormal; Notable for the following components:      Result Value   Glucose, Bld 47 (*)    Creatinine, Ser 1.12 (*)    Albumin 2.6 (*)    ALT 7 (*)    GFR calc non Af Amer 49 (*)    GFR calc Af Amer 56 (*)    All other components within normal limits  CBC WITH DIFFERENTIAL/PLATELET - Abnormal; Notable for the following components:   RBC 3.81 (*)     Hemoglobin 11.2 (*)    HCT 34.8 (*)    All other components within normal limits  URINALYSIS, ROUTINE W REFLEX MICROSCOPIC - Abnormal; Notable for the following components:   APPearance CLOUDY (*)    Hgb urine dipstick SMALL (*)    Protein, ur TRACE (*)    Leukocytes, UA MODERATE (*)    All other components within normal limits  URINALYSIS, MICROSCOPIC (REFLEX) - Abnormal; Notable for the following components:   Bacteria, UA MANY (*)    Squamous Epithelial / LPF 0-5 (*)    All other components within normal limits  CBG MONITORING, ED - Abnormal; Notable for the following components:   Glucose-Capillary 38 (*)    All other components within normal limits  CBG MONITORING, ED - Abnormal; Notable for the following components:   Glucose-Capillary 155 (*)    All other components within normal limits  CBG MONITORING, ED - Abnormal; Notable for the following components:   Glucose-Capillary 174 (*)    All other components within normal limits  URINE CULTURE  I-STAT TROPONIN, ED    EKG  EKG Interpretation None       Radiology Dg Chest 2 View  Result Date: 04/08/2017 CLINICAL DATA:  Fall 2 weeks ago.  Confusion. EXAM: CHEST  2 VIEW COMPARISON:  12/29/2015 FINDINGS: The heart size and mediastinal contours are within normal limits. Low lung volumes are again demonstrated. Elevation of right hemidiaphragm is stable. Increased linear opacity at both lung bases consistent with mild subsegmental atelectasis. No evidence of pulmonary consolidation or edema. No evidence of pneumothorax or pleural effusion. IMPRESSION: Low lung volumes with mild bibasilar subsegmental atelectasis. Electronically Signed   By: Earle Gell M.D.   On: 04/08/2017 14:41   Dg Lumbar Spine Complete  Result Date: 04/08/2017 CLINICAL DATA:  Fall 2 weeks ago. Low back pain. Initial encounter. EXAM: LUMBAR SPINE - COMPLETE 4+ VIEW COMPARISON:  None. FINDINGS: There is no evidence of lumbar spine fracture. Alignment is  normal. Mild degenerative disc disease is seen at L3-4 and at several levels in the lower thoracic spine. Mild to moderate facet DJD seen bilaterally mainly at levels of L4-5 and L5-S1. Mild lumbar levoscoliosis. Generalized osteopenia noted. No focal lytic or sclerotic bone lesions identified. Aortic atherosclerosis. IMPRESSION: No acute findings. Degenerative spondylosis, as described above. Osteopenia. Electronically Signed   By: Earle Gell M.D.   On: 04/08/2017 14:43  Procedures Procedures (including critical care time)  Medications Ordered in ED Medications  dextrose 50 % solution 50 mL (50 mLs Intravenous Given 04/08/17 1447)  cefTRIAXone (ROCEPHIN) 1 g in dextrose 5 % 50 mL IVPB (0 g Intravenous Stopped 04/08/17 1732)  sodium chloride 0.9 % bolus 1,000 mL (0 mLs Intravenous Stopped 04/08/17 1732)     Initial Impression / Assessment and Plan / ED Course  I have reviewed the triage vital signs and the nursing notes.  Pertinent labs & imaging results that were available during my care of the patient were reviewed by me and considered in my medical decision making (see chart for details).     Patient presents to ED with family for multiple complaints as mentioned in HPI. These include back pain, changes in appetite, low blood pressure, less energy.  She was seen here and evaluated about 20 days ago after a fall.  CT head and neck were done here and were unremarkable for acute abnormality.  They state that since then she has had changes in her appetite as well.  On physical exam patient is alert and oriented to person, place, year, month and situation.  She has otherwise nontoxic appearing and in no acute distress.  She has tenderness to palpation of her back but no focal deficits on neurological exam.  Cranial nerves appear grossly intact.  Lungs clear to auscultation bilaterally.  She has no abdominal or chest tenderness to palpation.  Her family does report compliance with her home  medications including glipizide for her diabetes.  She is afebrile with no history of fever.  She denies any dysuria.  CBG was low at 38 here.  Given food and amp of D50. CBC, CMP unremarkable.  Urinalysis with evidence of UTI.  Troponin negative x1.  Chest x-ray and x-ray of lumbar spine returned as unremarkable.  CBG did improve x2.  She was given fluids, Rocephin IV here in the ED.  Urine culture was sent.  Will advise patient to take only half of her glipizide dose until evaluated by her primary care provider.  Also put in request for home health aide, PT and OT. Patient appears stable for discharge at this time. Strict return precautions given.   Patient discussed with and seen by Dr. Kathrynn Humble.  Final Clinical Impressions(s) / ED Diagnoses   Final diagnoses:  Lower urinary tract infectious disease  Hypoglycemia    ED Discharge Orders        Ordered    cephALEXin (KEFLEX) 500 MG capsule  4 times daily     04/08/17 1731       Delia Heady, PA-C 04/08/17 [place], [place], MD 04/09/17 [phone]

## 2017-04-08 NOTE — ED Triage Notes (Addendum)
Family reports pt has been confused for the past 2 weeks since she fell. Family reports pt was gradually getting better until today. Family concerned that pt's BP not well controlled, but BP good in triage. Pt disoriented to year, but otherwise oriented. Complains of lower back pain and HA since fall.

## 2017-04-08 NOTE — Discharge Instructions (Addendum)
Please read the attached information regarding your condition. Please only take half a tablet of your glipizide until evaluated by your primary care provider. Follow-up with your primary care provider for further evaluation. Follow-up with results of your urine culture when available. Return to ED for worsening symptoms, chest pain, shortness of breath, injuries or falls, severe abdominal pain, vomiting or changes in mental status.

## 2017-04-09 ENCOUNTER — Other Ambulatory Visit: Payer: Self-pay

## 2017-04-09 ENCOUNTER — Emergency Department (HOSPITAL_COMMUNITY)
Admission: EM | Admit: 2017-04-09 | Discharge: 2017-04-10 | Disposition: A | Discharge status: Home / Self Care | Payer: Medicare HMO | Attending: Emergency Medicine | Admitting: Emergency Medicine

## 2017-04-09 ENCOUNTER — Encounter (HOSPITAL_COMMUNITY): Payer: Self-pay | Admitting: Emergency Medicine

## 2017-04-09 DIAGNOSIS — Z7982 Long term (current) use of aspirin: Secondary | ICD-10-CM | POA: Insufficient documentation

## 2017-04-09 DIAGNOSIS — Z79899 Other long term (current) drug therapy: Secondary | ICD-10-CM | POA: Diagnosis not present

## 2017-04-09 DIAGNOSIS — I10 Essential (primary) hypertension: Secondary | ICD-10-CM | POA: Diagnosis not present

## 2017-04-09 DIAGNOSIS — E119 Type 2 diabetes mellitus without complications: Secondary | ICD-10-CM | POA: Insufficient documentation

## 2017-04-09 DIAGNOSIS — R531 Weakness: Secondary | ICD-10-CM

## 2017-04-09 DIAGNOSIS — Z7902 Long term (current) use of antithrombotics/antiplatelets: Secondary | ICD-10-CM | POA: Insufficient documentation

## 2017-04-09 DIAGNOSIS — N3000 Acute cystitis without hematuria: Secondary | ICD-10-CM | POA: Diagnosis not present

## 2017-04-09 LAB — CBC WITH DIFFERENTIAL/PLATELET
Basophils Absolute: 0 10*3/uL (ref 0.0–0.1)
Basophils Relative: 1 %
Eosinophils Absolute: 0.5 10*3/uL (ref 0.0–0.7)
Eosinophils Relative: 6 %
HCT: 36.1 % (ref 36.0–46.0)
Hemoglobin: 11.4 g/dL — ABNORMAL LOW (ref 12.0–15.0)
Lymphocytes Relative: 37 %
Lymphs Abs: 3 10*3/uL (ref 0.7–4.0)
MCH: 29.4 pg (ref 26.0–34.0)
MCHC: 31.6 g/dL (ref 30.0–36.0)
MCV: 93 fL (ref 78.0–100.0)
Monocytes Absolute: 0.5 10*3/uL (ref 0.1–1.0)
Monocytes Relative: 6 %
Neutro Abs: 4.1 10*3/uL (ref 1.7–7.7)
Neutrophils Relative %: 50 %
Platelets: 390 10*3/uL (ref 150–400)
RBC: 3.88 MIL/uL (ref 3.87–5.11)
RDW: 13.6 % (ref 11.5–15.5)
WBC: 8 10*3/uL (ref 4.0–10.5)

## 2017-04-09 LAB — BASIC METABOLIC PANEL
Anion gap: 9 (ref 5–15)
BUN: 16 mg/dL (ref 6–20)
CO2: 25 mmol/L (ref 22–32)
Calcium: 8.7 mg/dL — ABNORMAL LOW (ref 8.9–10.3)
Chloride: 101 mmol/L (ref 101–111)
Creatinine, Ser: 1.41 mg/dL — ABNORMAL HIGH (ref 0.44–1.00)
GFR calc Af Amer: 43 mL/min — ABNORMAL LOW (ref >60)
GFR calc non Af Amer: 37 mL/min — ABNORMAL LOW (ref >60)
Glucose, Bld: 102 mg/dL — ABNORMAL HIGH (ref 65–99)
Potassium: 3.9 mmol/L (ref 3.5–5.1)
Sodium: 135 mmol/L (ref 135–145)

## 2017-04-09 LAB — CBG MONITORING, ED: Glucose-Capillary: 67 mg/dL (ref 65–99)

## 2017-04-09 MED ORDER — SODIUM CHLORIDE 0.9 % IV BOLUS (SEPSIS)
1000.0000 mL | Freq: Once | INTRAVENOUS | Status: AC
  Administered 2017-04-10: 1000 mL via INTRAVENOUS

## 2017-04-09 NOTE — ED Notes (Signed)
The pt has been ill; for 2-3 weeks cough cold no appetite  Sleeping more than ujsual.  She was seen at [facility] last pm. She fell 2 weeks ago and  Had a temp then    She just not feel well

## 2017-04-09 NOTE — ED Triage Notes (Signed)
Patient arrives with continued poor appetite, fatigue, and mild shaking. Was seen yesterday at [facility] for the same. CBG low yesterday, today 67 here. Family concerned as patient hasn't improved. Started on antibiotics yesterday.

## 2017-04-09 NOTE — ED Notes (Signed)
Delo MD at bedside. 

## 2017-04-10 LAB — URINE CULTURE

## 2017-04-10 LAB — CBG MONITORING, ED
Glucose-Capillary: 80 mg/dL (ref 65–99)
Glucose-Capillary: 85 mg/dL (ref 65–99)

## 2017-04-10 NOTE — ED Provider Notes (Signed)
[facility] EMERGENCY DEPARTMENT Provider Note   CSN: [id number] Arrival date & time: 04/09/17  1859     History   Chief Complaint Chief Complaint  Patient presents with  . Anorexia    HPI Chloe Harding is a 71 y.o. female.  Patient is a 71 year old female with past medical history of diabetes, depression, reflux, chronic renal insufficiency.  She presents today for evaluation of weakness and fatigue along with decreased appetite that is been worsening over the past 2 weeks.  She was seen at the emergency department at [facility] yesterday evening and diagnosed with a urinary tract infection.  She returns today stating that she has no desire to eat or drink, feels very weak when standing, and generally feels poorly.  She denies any specific pain.  She denies any fevers or chills.  She denies any bloody stool or vomit.   The history is provided by the patient.    Past Medical History:  Diagnosis Date  . Arthritis   . Depression   . Diabetes mellitus   . Hiatal hernia   . Hypercholesteremia   . Hypertension   . Reflux   . Renal disorder    shutting down 4 years ago    Patient Active Problem List   Diagnosis Date Noted  . Hypotension 03/30/2015  . Sepsis ([place]) 03/30/2015  . CAP (community acquired pneumonia) 03/30/2015  . Acute encephalopathy 03/30/2015  . Hyperkalemia 03/30/2015  . Acute kidney injury ([place]) 03/30/2015  . Nausea with vomiting 03/30/2015  . Diabetic neuropathy ([place]) 03/30/2015  . OBESITY 03/11/2010  . Type 2 diabetes mellitus ([place]) 12/26/2009  . Dyslipidemia 12/26/2009  . ANEMIA 12/26/2009  . DEPRESSION 12/26/2009  . HYPERTENSION, BENIGN ESSENTIAL 12/26/2009  . GERD 12/26/2009  . HIATAL HERNIA 12/26/2009  . CHRONIC KIDNEY DISEASE STAGE I 12/26/2009  . UTI 12/26/2009  . ARTHRITIS 12/26/2009  . ARTHROSCOPY, RIGHT KNEE, HX OF 12/26/2009    Past Surgical History:  Procedure Laterality Date  . CHOLECYSTECTOMY      OB  History    No data available       Home Medications    Prior to Admission medications   Medication Sig Start Date End Date Taking? Authorizing Provider  aspirin 81 MG tablet Take 81 mg by mouth daily.    [provider]  carvedilol (COREG) 12.5 MG tablet Take 12.5 mg by mouth 2 (two) times daily with a meal.    [provider]  cephALEXin (KEFLEX) 500 MG capsule Take 1 capsule (500 mg total) by mouth 4 (four) times daily. 04/08/17   Khatri, Hina, PA-C  citalopram (CELEXA) 40 MG tablet Take 40 mg by mouth daily.    [provider]  diltiazem (CARDIZEM) 30 MG tablet Take 30-60 mg by mouth 2 (two) times daily. 60 mg in the morning and 30mg  in the evening    [provider]  gabapentin (NEURONTIN) 600 MG tablet Take 600-1,200 mg by mouth 3 (three) times daily. 600mg  by mouth in the morning and 1200mg  by mouth at bedtime    [provider]  gemfibrozil (LOPID) 600 MG tablet Take 600 mg by mouth 2 (two) times daily before a meal.    [provider]  glipiZIDE (GLUCOTROL) 5 MG tablet Take 5 mg by mouth 2 (two) times daily before a meal.    [provider]  meloxicam (MOBIC) 15 MG tablet Take 15 mg by mouth daily.    [provider]  pravastatin (  PRAVACHOL) 20 MG tablet Take 40 mg by mouth at bedtime.     [provider]  rOPINIRole (REQUIP) 2 MG tablet Take 2 mg by mouth at bedtime.    [provider]  saccharomyces boulardii (FLORASTOR) 250 MG capsule Take 1 capsule (250 mg total) by mouth 2 (two) times daily. Patient not taking: Reported on 12/29/2015 04/02/15   Lavina Hamman, MD  traMADol (ULTRAM) 50 MG tablet Take 50 mg by mouth every 6 (six) hours as needed for moderate pain.    [provider]    Family History History reviewed. No pertinent family history.  Social History Social History   Tobacco Use  . Smoking status: Never Smoker  . Smokeless tobacco: Never Used  Substance Use  Topics  . Alcohol use: No  . Drug use: No     Allergies   Amlodipine besylate   Review of Systems Review of Systems  All other systems reviewed and are negative.    Physical Exam Updated Vital Signs BP 125/65   Pulse 73   Temp 98.8 F (37.1 C) (Oral)   Resp 18   SpO2 98%   Physical Exam  Constitutional: She is oriented to person, place, and time. She appears well-developed and well-nourished. No distress.  HENT:  Head: Normocephalic and atraumatic.  Neck: Normal range of motion. Neck supple.  Cardiovascular: Normal rate and regular rhythm. Exam reveals no gallop and no friction rub.  No murmur heard. Pulmonary/Chest: Effort normal and breath sounds normal. No respiratory distress. She has no wheezes.  Abdominal: Soft. Bowel sounds are normal. She exhibits no distension. There is no tenderness.  Musculoskeletal: Normal range of motion.  Neurological: She is alert and oriented to person, place, and time.  Skin: Skin is warm and dry. She is not diaphoretic.  Nursing note and vitals reviewed.    ED Treatments / Results  Labs (all labs ordered are listed, but only abnormal results are displayed) Labs Reviewed  BASIC METABOLIC PANEL - Abnormal; Notable for the following components:      Result Value   Glucose, Bld 102 (*)    Creatinine, Ser 1.41 (*)    Calcium 8.7 (*)    GFR calc non Af Amer 37 (*)    GFR calc Af Amer 43 (*)    All other components within normal limits  CBC WITH DIFFERENTIAL/PLATELET - Abnormal; Notable for the following components:   Hemoglobin 11.4 (*)    All other components within normal limits  CBG MONITORING, ED  CBG MONITORING, ED    EKG  EKG Interpretation  Date/Time:  Friday April 09 2017 19:42:16 EST Ventricular Rate:  78 PR Interval:  170 QRS Duration: 70 QT Interval:  362 QTC Calculation: 412 R Axis:   9 Text Interpretation:  Normal sinus rhythm Low voltage QRS Cannot rule out Anterior infarct , age undetermined Abnormal  ECG Confirmed by Lacretia Leigh ([id number]) on 04/09/2017 10:19:56 PM       Radiology Dg Chest 2 View  Result Date: 04/08/2017 CLINICAL DATA:  Fall 2 weeks ago.  Confusion. EXAM: CHEST  2 VIEW COMPARISON:  12/29/2015 FINDINGS: The heart size and mediastinal contours are within normal limits. Low lung volumes are again demonstrated. Elevation of right hemidiaphragm is stable. Increased linear opacity at both lung bases consistent with mild subsegmental atelectasis. No evidence of pulmonary consolidation or edema. No evidence of pneumothorax or pleural effusion. IMPRESSION: Low lung volumes with mild bibasilar subsegmental atelectasis. Electronically Signed   By: Jenny Reichmann  Kris Hartmann M.D.   On: 04/08/2017 14:41   Dg Lumbar Spine Complete  Result Date: 04/08/2017 CLINICAL DATA:  Fall 2 weeks ago. Low back pain. Initial encounter. EXAM: LUMBAR SPINE - COMPLETE 4+ VIEW COMPARISON:  None. FINDINGS: There is no evidence of lumbar spine fracture. Alignment is normal. Mild degenerative disc disease is seen at L3-4 and at several levels in the lower thoracic spine. Mild to moderate facet DJD seen bilaterally mainly at levels of L4-5 and L5-S1. Mild lumbar levoscoliosis. Generalized osteopenia noted. No focal lytic or sclerotic bone lesions identified. Aortic atherosclerosis. IMPRESSION: No acute findings. Degenerative spondylosis, as described above. Osteopenia. Electronically Signed   By: Earle Gell M.D.   On: 04/08/2017 14:43    Procedures Procedures (including critical care time)  Medications Ordered in ED Medications  sodium chloride 0.9 % bolus 1,000 mL (not administered)     Initial Impression / Assessment and Plan / ED Course  I have reviewed the triage vital signs and the nursing notes.  Pertinent labs & imaging results that were available during my care of the patient were reviewed by me and considered in my medical decision making (see chart for details).  Patient presents here with complaints of  weakness, increased fatigue, decreased appetite, and generalized malaise.  She was started on an antibiotic this morning for a urinary tract infection.  Clinically, the patient appears well.  Her vitals are stable and I see no obvious concerning physical findings.  Her laboratory studies are all reassuring as well.  She has no elevation of white count and electrolytes are essentially normal with creatinine at her baseline.  She was given intravenous fluids and requested food as she told me she was hungry.  She was able to tolerate oral liquids and food.  The family was requesting she be admitted, however I see no indication for this.  I advised them to give the antibiotic another day or 2 to work and see how things go.  If she is not improving, she can follow-up with her primary doctor and return here if she worsens.  Final Clinical Impressions(s) / ED Diagnoses   Final diagnoses:  None    ED Discharge Orders    None       Veryl Speak, MD 04/10/17 [phone]

## 2017-04-10 NOTE — ED Notes (Signed)
Pt tolerated PO liquids and half of a sandwich.

## 2017-04-10 NOTE — ED Notes (Signed)
Pt CBG 80 RN Sarah notified.

## 2017-04-10 NOTE — Discharge Instructions (Signed)
Continue antibiotics as previously prescribed.  Follow-up with your primary doctor if symptoms are not improving in the next 2-3 days, and return to the ER if symptoms significantly worsen or change.

## 2017-04-16 ENCOUNTER — Encounter (HOSPITAL_COMMUNITY): Payer: Self-pay | Admitting: Emergency Medicine

## 2017-04-16 ENCOUNTER — Emergency Department (HOSPITAL_COMMUNITY)
Admission: EM | Admit: 2017-04-16 | Discharge: 2017-04-16 | Disposition: A | Discharge status: Home / Self Care | Payer: Medicare HMO | Attending: Emergency Medicine | Admitting: Emergency Medicine

## 2017-04-16 DIAGNOSIS — I10 Essential (primary) hypertension: Secondary | ICD-10-CM | POA: Diagnosis not present

## 2017-04-16 DIAGNOSIS — Z79899 Other long term (current) drug therapy: Secondary | ICD-10-CM | POA: Insufficient documentation

## 2017-04-16 DIAGNOSIS — Z7982 Long term (current) use of aspirin: Secondary | ICD-10-CM | POA: Diagnosis not present

## 2017-04-16 DIAGNOSIS — R531 Weakness: Secondary | ICD-10-CM | POA: Diagnosis not present

## 2017-04-16 DIAGNOSIS — E119 Type 2 diabetes mellitus without complications: Secondary | ICD-10-CM | POA: Diagnosis not present

## 2017-04-16 LAB — I-STAT CHEM 8, ED
BUN: 24 mg/dL — AB (ref 6–20)
CREATININE: 1 mg/dL (ref 0.44–1.00)
Calcium, Ion: 1.17 mmol/L (ref 1.15–1.40)
Chloride: 112 mmol/L — ABNORMAL HIGH (ref 101–111)
Glucose, Bld: 55 mg/dL — ABNORMAL LOW (ref 65–99)
HEMATOCRIT: 37 % (ref 36.0–46.0)
Hemoglobin: 12.6 g/dL (ref 12.0–15.0)
POTASSIUM: 5.2 mmol/L — AB (ref 3.5–5.1)
Sodium: 145 mmol/L (ref 135–145)
TCO2: 29 mmol/L (ref 22–32)

## 2017-04-16 LAB — CBG MONITORING, ED
GLUCOSE-CAPILLARY: 42 mg/dL — AB (ref 65–99)
GLUCOSE-CAPILLARY: 92 mg/dL (ref 65–99)

## 2017-04-16 LAB — I-STAT TROPONIN, ED: Troponin i, poc: 0 ng/mL (ref 0.00–0.08)

## 2017-04-16 MED ORDER — SODIUM CHLORIDE 0.9 % IV BOLUS (SEPSIS)
1000.0000 mL | Freq: Once | INTRAVENOUS | Status: AC
  Administered 2017-04-16: 1000 mL via INTRAVENOUS

## 2017-04-16 MED ORDER — DEXTROSE 50 % IV SOLN
25.0000 mL | Freq: Once | INTRAVENOUS | Status: AC
  Administered 2017-04-16: 25 mL via INTRAVENOUS
  Filled 2017-04-16: qty 50

## 2017-04-16 NOTE — ED Provider Notes (Signed)
[facility] DEPT Provider Note   CSN: [id number] Arrival date & time: 04/16/17  1639     History   Chief Complaint Chief Complaint  Patient presents with  . Weakness    HPI SOLIANA KITKO is a 71 y.o. female.  Patient states that her family was concerned about her blood pressure and her blood glucose.  She stated she did not want to come to the hospital but finally agreed.  The paramedics stated that the patient really did not want to come to the hospital   The history is provided by the patient. No language interpreter was used.  Weakness  Primary symptoms include no focal weakness. This is a recurrent problem. The current episode started more than 1 week ago. The problem has not changed since onset.There was no focality noted. There has been no fever. Pertinent negatives include no shortness of breath, no chest pain and no headaches.    Past Medical History:  Diagnosis Date  . Arthritis   . Depression   . Diabetes mellitus   . Hiatal hernia   . Hypercholesteremia   . Hypertension   . Reflux   . Renal disorder    shutting down 4 years ago    Patient Active Problem List   Diagnosis Date Noted  . Hypotension 03/30/2015  . Sepsis ([place]) 03/30/2015  . CAP (community acquired pneumonia) 03/30/2015  . Acute encephalopathy 03/30/2015  . Hyperkalemia 03/30/2015  . Acute kidney injury ([place]) 03/30/2015  . Nausea with vomiting 03/30/2015  . Diabetic neuropathy ([place]) 03/30/2015  . OBESITY 03/11/2010  . Type 2 diabetes mellitus ([place]) 12/26/2009  . Dyslipidemia 12/26/2009  . ANEMIA 12/26/2009  . DEPRESSION 12/26/2009  . HYPERTENSION, BENIGN ESSENTIAL 12/26/2009  . GERD 12/26/2009  . HIATAL HERNIA 12/26/2009  . CHRONIC KIDNEY DISEASE STAGE I 12/26/2009  . UTI 12/26/2009  . ARTHRITIS 12/26/2009  . ARTHROSCOPY, RIGHT KNEE, HX OF 12/26/2009    Past Surgical History:  Procedure Laterality Date  . CHOLECYSTECTOMY      OB History    No  data available       Home Medications    Prior to Admission medications   Medication Sig Start Date End Date Taking? Authorizing Provider  aspirin 81 MG tablet Take 81 mg by mouth daily.    [provider]  carvedilol (COREG) 12.5 MG tablet Take 12.5 mg by mouth 2 (two) times daily with a meal.    [provider]  cephALEXin (KEFLEX) 500 MG capsule Take 1 capsule (500 mg total) by mouth 4 (four) times daily. 04/08/17   Khatri, Hina, PA-C  citalopram (CELEXA) 40 MG tablet Take 40 mg by mouth daily.    [provider]  diltiazem (CARDIZEM) 30 MG tablet Take 30-60 mg by mouth 2 (two) times daily. 60 mg in the morning and 30mg  in the evening    [provider]  gabapentin (NEURONTIN) 600 MG tablet Take 600-1,200 mg by mouth 3 (three) times daily. 600mg  by mouth in the morning and 1200mg  by mouth at bedtime    [provider]  gemfibrozil (LOPID) 600 MG tablet Take 600 mg by mouth 2 (two) times daily before a meal.    [provider]  glipiZIDE (GLUCOTROL) 5 MG tablet Take 5 mg by mouth 2 (two) times daily before a meal.    [provider]  meloxicam (MOBIC) 15 MG tablet Take 15 mg by mouth daily.    [provider]  pravastatin (PRAVACHOL)  20 MG tablet Take 40 mg by mouth at bedtime.     [provider]  rOPINIRole (REQUIP) 2 MG tablet Take 2 mg by mouth at bedtime.    [provider]  saccharomyces boulardii (FLORASTOR) 250 MG capsule Take 1 capsule (250 mg total) by mouth 2 (two) times daily. Patient not taking: Reported on 12/29/2015 04/02/15   Lavina Hamman, MD  traMADol (ULTRAM) 50 MG tablet Take 50 mg by mouth every 6 (six) hours as needed for moderate pain.    [provider]    Family History No family history on file.  Social History Social History   Tobacco Use  . Smoking status: Never Smoker  . Smokeless tobacco: Never Used  Substance Use Topics  . Alcohol use: No  . Drug  use: No     Allergies   Amlodipine besylate   Review of Systems Review of Systems  Constitutional: Negative for appetite change and fatigue.  HENT: Negative for congestion, ear discharge and sinus pressure.   Eyes: Negative for discharge.  Respiratory: Negative for cough and shortness of breath.   Cardiovascular: Negative for chest pain.  Gastrointestinal: Negative for abdominal pain and diarrhea.  Genitourinary: Negative for frequency and hematuria.  Musculoskeletal: Negative for back pain.  Skin: Negative for rash.  Neurological: Positive for weakness. Negative for focal weakness, seizures and headaches.  Psychiatric/Behavioral: Negative for hallucinations.     Physical Exam Updated Vital Signs BP (!) 164/89   Pulse 87   Temp 98.5 F (36.9 C) (Oral)   Resp 15   SpO2 97%   Physical Exam  Constitutional: She is oriented to person, place, and time. She appears well-developed.  HENT:  Head: Normocephalic.  Eyes: Conjunctivae and EOM are normal. No scleral icterus.  Neck: Neck supple. No thyromegaly present.  Cardiovascular: Normal rate and regular rhythm. Exam reveals no gallop and no friction rub.  No murmur heard. Pulmonary/Chest: No stridor. She has no wheezes. She has no rales. She exhibits no tenderness.  Abdominal: She exhibits no distension. There is no tenderness. There is no rebound.  Musculoskeletal: Normal range of motion. She exhibits no edema.  Lymphadenopathy:    She has no cervical adenopathy.  Neurological: She is oriented to person, place, and time. She exhibits normal muscle tone. Coordination normal.  Skin: No rash noted. No erythema.  Psychiatric: She has a normal mood and affect. Her behavior is normal.     ED Treatments / Results  Labs (all labs ordered are listed, but only abnormal results are displayed) Labs Reviewed  I-STAT CHEM 8, ED - Abnormal; Notable for the following components:      Result Value   Potassium 5.2 (*)    Chloride  112 (*)    BUN 24 (*)    Glucose, Bld 55 (*)    All other components within normal limits  CBG MONITORING, ED - Abnormal; Notable for the following components:   Glucose-Capillary 42 (*)    All other components within normal limits  I-STAT TROPONIN, ED  CBG MONITORING, ED    EKG  EKG Interpretation None       Radiology No results found.  Procedures Procedures (including critical care time)  Medications Ordered in ED Medications  sodium chloride 0.9 % bolus 1,000 mL (0 mLs Intravenous Stopped 04/16/17 1924)  dextrose 50 % solution 25 mL (25 mLs Intravenous Given 04/16/17 1822)     Initial Impression / Assessment and Plan / ED Course  I have reviewed  the triage vital signs and the nursing notes.  Pertinent labs & imaging results that were available during my care of the patient were reviewed by me and considered in my medical decision making (see chart for details).    Family had concerns of patient's blood pressure and glucose.  Labs are unremarkable although patient's glucose did drop while she was in the hospital.  She states that her sugars have been running like in the 90s.  Patient's blood pressure was mildly elevated.  She was instructed to check her sugar daily and to follow-up on December 11 as scheduled with her doctor  Final Clinical Impressions(s) / ED Diagnoses   Final diagnoses:  Weakness    ED Discharge Orders    None       Milton Ferguson, MD 04/16/17 1932

## 2017-04-16 NOTE — ED Notes (Signed)
Patient assisted to bathroom in wheelchair 

## 2017-04-16 NOTE — ED Notes (Signed)
ED Provider at bedside. 

## 2017-04-16 NOTE — ED Notes (Signed)
Pt given 2 orange juices after blood sugar check, blood sugar was 42, RN and DR notified

## 2017-04-16 NOTE — ED Triage Notes (Signed)
Per EMS, patient family called expressing concern of patients fluctuating blood pressure and blood sugar. CBG 75 and BP 144/78 with EMS. Patient endorses generalized weakness x1 week. Ambulatory with EMS. Denies N/V/D, abdominal pain, CP and SOB.

## 2017-04-16 NOTE — Discharge Instructions (Signed)
Check your sugar once a day.  And follow-up with your doctor as scheduled on the 11th

## 2017-04-27 ENCOUNTER — Other Ambulatory Visit: Payer: Self-pay | Admitting: Specialist

## 2017-04-27 DIAGNOSIS — M5417 Radiculopathy, lumbosacral region: Secondary | ICD-10-CM

## 2017-04-28 ENCOUNTER — Other Ambulatory Visit: Payer: Self-pay | Admitting: Specialist

## 2017-04-28 DIAGNOSIS — M5417 Radiculopathy, lumbosacral region: Secondary | ICD-10-CM

## 2017-04-28 DIAGNOSIS — G43009 Migraine without aura, not intractable, without status migrainosus: Secondary | ICD-10-CM

## 2017-05-08 ENCOUNTER — Other Ambulatory Visit: Payer: Medicare HMO

## 2017-05-17 ENCOUNTER — Other Ambulatory Visit: Payer: Self-pay | Admitting: Family Medicine

## 2017-05-17 DIAGNOSIS — W19XXXA Unspecified fall, initial encounter: Secondary | ICD-10-CM

## 2017-05-18 ENCOUNTER — Emergency Department (HOSPITAL_COMMUNITY): Payer: Medicare HMO

## 2017-05-18 ENCOUNTER — Observation Stay (HOSPITAL_COMMUNITY): Payer: Medicare HMO

## 2017-05-18 ENCOUNTER — Other Ambulatory Visit: Payer: Self-pay

## 2017-05-18 ENCOUNTER — Observation Stay (HOSPITAL_COMMUNITY)
Admission: EM | Admit: 2017-05-18 | Discharge: 2017-05-21 | Disposition: A | Discharge status: Home / Self Care | Payer: Medicare HMO | Attending: Internal Medicine | Admitting: Internal Medicine

## 2017-05-18 ENCOUNTER — Encounter (HOSPITAL_COMMUNITY): Payer: Self-pay | Admitting: Emergency Medicine

## 2017-05-18 DIAGNOSIS — E669 Obesity, unspecified: Secondary | ICD-10-CM | POA: Diagnosis not present

## 2017-05-18 DIAGNOSIS — N183 Chronic kidney disease, stage 3 (moderate): Secondary | ICD-10-CM | POA: Diagnosis not present

## 2017-05-18 DIAGNOSIS — I129 Hypertensive chronic kidney disease with stage 1 through stage 4 chronic kidney disease, or unspecified chronic kidney disease: Secondary | ICD-10-CM | POA: Insufficient documentation

## 2017-05-18 DIAGNOSIS — M549 Dorsalgia, unspecified: Secondary | ICD-10-CM | POA: Diagnosis not present

## 2017-05-18 DIAGNOSIS — N189 Chronic kidney disease, unspecified: Secondary | ICD-10-CM

## 2017-05-18 DIAGNOSIS — I10 Essential (primary) hypertension: Secondary | ICD-10-CM | POA: Diagnosis present

## 2017-05-18 DIAGNOSIS — R0789 Other chest pain: Secondary | ICD-10-CM | POA: Diagnosis not present

## 2017-05-18 DIAGNOSIS — M542 Cervicalgia: Secondary | ICD-10-CM | POA: Diagnosis not present

## 2017-05-18 DIAGNOSIS — E11649 Type 2 diabetes mellitus with hypoglycemia without coma: Secondary | ICD-10-CM | POA: Diagnosis not present

## 2017-05-18 DIAGNOSIS — D649 Anemia, unspecified: Secondary | ICD-10-CM | POA: Diagnosis present

## 2017-05-18 DIAGNOSIS — E7800 Pure hypercholesterolemia, unspecified: Secondary | ICD-10-CM | POA: Diagnosis present

## 2017-05-18 DIAGNOSIS — K5730 Diverticulosis of large intestine without perforation or abscess without bleeding: Secondary | ICD-10-CM | POA: Insufficient documentation

## 2017-05-18 DIAGNOSIS — E119 Type 2 diabetes mellitus without complications: Secondary | ICD-10-CM | POA: Diagnosis present

## 2017-05-18 DIAGNOSIS — E041 Nontoxic single thyroid nodule: Secondary | ICD-10-CM | POA: Insufficient documentation

## 2017-05-18 DIAGNOSIS — N179 Acute kidney failure, unspecified: Secondary | ICD-10-CM | POA: Diagnosis not present

## 2017-05-18 DIAGNOSIS — K449 Diaphragmatic hernia without obstruction or gangrene: Secondary | ICD-10-CM | POA: Insufficient documentation

## 2017-05-18 DIAGNOSIS — Z9049 Acquired absence of other specified parts of digestive tract: Secondary | ICD-10-CM | POA: Insufficient documentation

## 2017-05-18 DIAGNOSIS — K219 Gastro-esophageal reflux disease without esophagitis: Secondary | ICD-10-CM | POA: Insufficient documentation

## 2017-05-18 DIAGNOSIS — Z79899 Other long term (current) drug therapy: Secondary | ICD-10-CM | POA: Insufficient documentation

## 2017-05-18 DIAGNOSIS — Z6829 Body mass index (BMI) 29.0-29.9, adult: Secondary | ICD-10-CM | POA: Insufficient documentation

## 2017-05-18 DIAGNOSIS — K5140 Inflammatory polyps of colon without complications: Secondary | ICD-10-CM | POA: Insufficient documentation

## 2017-05-18 DIAGNOSIS — Z7982 Long term (current) use of aspirin: Secondary | ICD-10-CM | POA: Insufficient documentation

## 2017-05-18 DIAGNOSIS — Z87440 Personal history of urinary (tract) infections: Secondary | ICD-10-CM | POA: Insufficient documentation

## 2017-05-18 DIAGNOSIS — Z8719 Personal history of other diseases of the digestive system: Secondary | ICD-10-CM

## 2017-05-18 DIAGNOSIS — G8929 Other chronic pain: Secondary | ICD-10-CM | POA: Insufficient documentation

## 2017-05-18 DIAGNOSIS — W19XXXA Unspecified fall, initial encounter: Secondary | ICD-10-CM

## 2017-05-18 DIAGNOSIS — E1122 Type 2 diabetes mellitus with diabetic chronic kidney disease: Secondary | ICD-10-CM | POA: Insufficient documentation

## 2017-05-18 DIAGNOSIS — R195 Other fecal abnormalities: Secondary | ICD-10-CM | POA: Diagnosis present

## 2017-05-18 DIAGNOSIS — K2210 Ulcer of esophagus without bleeding: Secondary | ICD-10-CM | POA: Insufficient documentation

## 2017-05-18 DIAGNOSIS — K922 Gastrointestinal hemorrhage, unspecified: Secondary | ICD-10-CM | POA: Diagnosis present

## 2017-05-18 DIAGNOSIS — D509 Iron deficiency anemia, unspecified: Secondary | ICD-10-CM | POA: Diagnosis not present

## 2017-05-18 DIAGNOSIS — E1140 Type 2 diabetes mellitus with diabetic neuropathy, unspecified: Secondary | ICD-10-CM | POA: Insufficient documentation

## 2017-05-18 DIAGNOSIS — E872 Acidosis: Secondary | ICD-10-CM | POA: Insufficient documentation

## 2017-05-18 DIAGNOSIS — Z888 Allergy status to other drugs, medicaments and biological substances status: Secondary | ICD-10-CM | POA: Insufficient documentation

## 2017-05-18 DIAGNOSIS — M1990 Unspecified osteoarthritis, unspecified site: Secondary | ICD-10-CM | POA: Diagnosis not present

## 2017-05-18 DIAGNOSIS — R109 Unspecified abdominal pain: Secondary | ICD-10-CM

## 2017-05-18 LAB — BASIC METABOLIC PANEL
ANION GAP: 15 (ref 5–15)
BUN: 71 mg/dL — ABNORMAL HIGH (ref 6–20)
CHLORIDE: 96 mmol/L — AB (ref 101–111)
CO2: 21 mmol/L — AB (ref 22–32)
Calcium: 10 mg/dL (ref 8.9–10.3)
Creatinine, Ser: 2.05 mg/dL — ABNORMAL HIGH (ref 0.44–1.00)
GFR calc non Af Amer: 23 mL/min — ABNORMAL LOW (ref >60)
GFR, EST AFRICAN AMERICAN: 27 mL/min — AB (ref >60)
Glucose, Bld: 136 mg/dL — ABNORMAL HIGH (ref 65–99)
POTASSIUM: 4.5 mmol/L (ref 3.5–5.1)
Sodium: 132 mmol/L — ABNORMAL LOW (ref 135–145)

## 2017-05-18 LAB — URINALYSIS, ROUTINE W REFLEX MICROSCOPIC
Bilirubin Urine: NEGATIVE
GLUCOSE, UA: NEGATIVE mg/dL
Hgb urine dipstick: NEGATIVE
KETONES UR: NEGATIVE mg/dL
NITRITE: NEGATIVE
PH: 5 (ref 5.0–8.0)
PROTEIN: NEGATIVE mg/dL
Specific Gravity, Urine: 1.006 (ref 1.005–1.030)
Squamous Epithelial / LPF: NONE SEEN

## 2017-05-18 LAB — CBC
HEMATOCRIT: 22.3 % — AB (ref 36.0–46.0)
Hemoglobin: 7.4 g/dL — ABNORMAL LOW (ref 12.0–15.0)
MCH: 29.4 pg (ref 26.0–34.0)
MCHC: 33.2 g/dL (ref 30.0–36.0)
MCV: 88.5 fL (ref 78.0–100.0)
Platelets: 298 10*3/uL (ref 150–400)
RBC: 2.52 MIL/uL — AB (ref 3.87–5.11)
RDW: 15.4 % (ref 11.5–15.5)
WBC: 5.4 10*3/uL (ref 4.0–10.5)

## 2017-05-18 LAB — GLUCOSE, CAPILLARY
GLUCOSE-CAPILLARY: 113 mg/dL — AB (ref 65–99)
Glucose-Capillary: 82 mg/dL (ref 65–99)

## 2017-05-18 LAB — I-STAT TROPONIN, ED: Troponin i, poc: 0 ng/mL (ref 0.00–0.08)

## 2017-05-18 LAB — HEMOGLOBIN A1C
Hgb A1c MFr Bld: 6.5 % — ABNORMAL HIGH (ref 4.8–5.6)
Mean Plasma Glucose: 139.85 mg/dL

## 2017-05-18 LAB — PREPARE RBC (CROSSMATCH)

## 2017-05-18 LAB — CBG MONITORING, ED: GLUCOSE-CAPILLARY: 99 mg/dL (ref 65–99)

## 2017-05-18 LAB — POC OCCULT BLOOD, ED: Fecal Occult Bld: POSITIVE — AB

## 2017-05-18 LAB — ABO/RH: ABO/RH(D): O POS

## 2017-05-18 MED ORDER — PANTOPRAZOLE SODIUM 40 MG IV SOLR
40.0000 mg | Freq: Two times a day (BID) | INTRAVENOUS | Status: DC
  Administered 2017-05-18 – 2017-05-21 (×6): 40 mg via INTRAVENOUS
  Filled 2017-05-18 (×7): qty 40

## 2017-05-18 MED ORDER — ROPINIROLE HCL 1 MG PO TABS
2.0000 mg | ORAL_TABLET | Freq: Every day | ORAL | Status: DC
  Administered 2017-05-18 – 2017-05-20 (×3): 2 mg via ORAL
  Filled 2017-05-18 (×3): qty 2

## 2017-05-18 MED ORDER — ONDANSETRON HCL 4 MG PO TABS: 4.0000 mg | ORAL_TABLET | Freq: Four times a day (QID) | ORAL | Status: DC | PRN

## 2017-05-18 MED ORDER — INSULIN ASPART 100 UNIT/ML ~~LOC~~ SOLN: 0.0000 [IU] | Freq: Every day | SUBCUTANEOUS | Status: DC

## 2017-05-18 MED ORDER — SODIUM CHLORIDE 0.9 % IV BOLUS (SEPSIS)
1000.0000 mL | Freq: Once | INTRAVENOUS | Status: AC
  Administered 2017-05-18: 1000 mL via INTRAVENOUS

## 2017-05-18 MED ORDER — FAMOTIDINE IN NACL 20-0.9 MG/50ML-% IV SOLN
20.0000 mg | Freq: Two times a day (BID) | INTRAVENOUS | Status: DC
  Administered 2017-05-18 – 2017-05-21 (×5): 20 mg via INTRAVENOUS
  Filled 2017-05-18 (×8): qty 50

## 2017-05-18 MED ORDER — GABAPENTIN 600 MG PO TABS
600.0000 mg | ORAL_TABLET | Freq: Three times a day (TID) | ORAL | Status: DC
  Administered 2017-05-18 – 2017-05-21 (×7): 600 mg via ORAL
  Filled 2017-05-18 (×7): qty 1

## 2017-05-18 MED ORDER — SODIUM CHLORIDE 0.9 % IV SOLN
INTRAVENOUS | Status: DC
  Administered 2017-05-18 – 2017-05-19 (×3): via INTRAVENOUS

## 2017-05-18 MED ORDER — SODIUM CHLORIDE 0.9 % IV SOLN: INTRAVENOUS | Status: DC

## 2017-05-18 MED ORDER — ACETAMINOPHEN 325 MG PO TABS: 650.0000 mg | ORAL_TABLET | Freq: Four times a day (QID) | ORAL | Status: DC | PRN

## 2017-05-18 MED ORDER — CITALOPRAM HYDROBROMIDE 10 MG PO TABS: 40.0000 mg | ORAL_TABLET | Freq: Every day | ORAL | Status: DC

## 2017-05-18 MED ORDER — ONDANSETRON HCL 4 MG/2ML IJ SOLN: 4.0000 mg | Freq: Four times a day (QID) | INTRAMUSCULAR | Status: DC | PRN

## 2017-05-18 MED ORDER — SODIUM CHLORIDE 0.9% FLUSH
3.0000 mL | Freq: Two times a day (BID) | INTRAVENOUS | Status: DC
  Administered 2017-05-18 – 2017-05-21 (×3): 3 mL via INTRAVENOUS

## 2017-05-18 MED ORDER — INSULIN ASPART 100 UNIT/ML ~~LOC~~ SOLN
0.0000 [IU] | Freq: Three times a day (TID) | SUBCUTANEOUS | Status: DC
  Administered 2017-05-20: 2 [IU] via SUBCUTANEOUS

## 2017-05-18 MED ORDER — CARVEDILOL 12.5 MG PO TABS: 12.5000 mg | ORAL_TABLET | Freq: Two times a day (BID) | ORAL | Status: DC

## 2017-05-18 MED ORDER — MORPHINE SULFATE (PF) 4 MG/ML IV SOLN: 1.0000 mg | INTRAVENOUS | Status: DC | PRN

## 2017-05-18 MED ORDER — AMITRIPTYLINE HCL 50 MG PO TABS
25.0000 mg | ORAL_TABLET | Freq: Every day | ORAL | Status: DC
  Administered 2017-05-18 – 2017-05-20 (×3): 25 mg via ORAL
  Filled 2017-05-18 (×3): qty 1

## 2017-05-18 MED ORDER — ACETAMINOPHEN 650 MG RE SUPP: 650.0000 mg | Freq: Four times a day (QID) | RECTAL | Status: DC | PRN

## 2017-05-18 MED ORDER — DILTIAZEM HCL 30 MG PO TABS: 30.0000 mg | ORAL_TABLET | Freq: Two times a day (BID) | ORAL | Status: DC

## 2017-05-18 MED ORDER — SODIUM CHLORIDE 0.9 % IV SOLN: Freq: Once | INTRAVENOUS | Status: DC

## 2017-05-18 MED ORDER — PEG 3350-KCL-NA BICARB-NACL 420 G PO SOLR
4000.0000 mL | Freq: Once | ORAL | Status: AC
  Administered 2017-05-18: 4000 mL via ORAL
  Filled 2017-05-18: qty 4000

## 2017-05-18 NOTE — ED Notes (Signed)
CBG- 99 

## 2017-05-18 NOTE — ED Triage Notes (Signed)
The patient started having chest pain but does not remember when it began.  She said she was going to the bathroom and fell hit her head and left leg against the commode.  The patient's husband called EMS.  They put a 22 gauge gave 324mg  of Aspirin PO, said patient is A&O X4, moving all extremities equally.  Patient has had multiple falls.   She is not on blood thinners.  Rates her pain 10/10.

## 2017-05-18 NOTE — ED Notes (Signed)
Pt made aware of need for 2nd iv. Pt states she does not want a 2nd IV at this time.

## 2017-05-18 NOTE — ED Notes (Signed)
EMS patented to stand the patient and she let herself almost drop to the stretcher.  She said her legs felt weak.  She is also complaining of a headache.

## 2017-05-18 NOTE — Consult Note (Signed)
Reason for Consult: Symptomatic anemia Referring Physician: [facility]  [name] HPI: The patient was at home when she complained about chest pain and had a syncopal episode.  Upon arrival to the ER she was found to have a significant drop in her HGB from 12.6 g/dL (04/16/2017) down to 7.4 g/dL.  Her stool was noted to be heme positive.  Her syncopal episode is not the first time this has occurred.  She has had this issue several times since September last year and she is undergoing work up.  Currently she denies any issues with chest pain.  This past Saturday she reported coffee-ground emesis, but she denies any problems with GERD.  In 2013 she had issues with dysphagia and she was evaluated at [facility] where she underwent several EGDs with dilation.  Of the available reports there was evidence of an esophagitis with the biopsies.  She reports being on a PPI, but she was not very certain when pressed further about this issue.  The patient denies any prior colonoscopies and she denies any episodes of hematochezia or melena.  Past Medical History:  Diagnosis Date  . Arthritis   . Depression   . Diabetes mellitus   . Hiatal hernia   . Hypercholesteremia   . Hypertension   . Reflux   . Renal disorder    shutting down 4 years ago    Past Surgical History:  Procedure Laterality Date  . CHOLECYSTECTOMY      History reviewed. No pertinent family history.  Social History:  reports that  has never smoked. she has never used smokeless tobacco. She reports that she does not drink alcohol or use drugs.  Allergies:  Allergies  Allergen Reactions  . Amlodipine Besylate     swelling    Medications:  Scheduled:  Continuous: . sodium chloride      Results for orders placed or performed during the hospital encounter of 05/18/17 (from the past 24 hour(s))  Basic metabolic panel     Status: Abnormal   Collection Time: 05/18/17  7:18 AM  Result Value Ref Range   Sodium 132  (L) 135 - 145 mmol/L   Potassium 4.5 3.5 - 5.1 mmol/L   Chloride 96 (L) 101 - 111 mmol/L   CO2 21 (L) 22 - 32 mmol/L   Glucose, Bld 136 (H) 65 - 99 mg/dL   BUN 71 (H) 6 - 20 mg/dL   Creatinine, Ser 2.05 (H) 0.44 - 1.00 mg/dL   Calcium 10.0 8.9 - 10.3 mg/dL   GFR calc non Af Amer 23 (L) >60 mL/min   GFR calc Af Amer 27 (L) >60 mL/min   Anion gap 15 5 - 15  CBC     Status: Abnormal   Collection Time: 05/18/17  7:18 AM  Result Value Ref Range   WBC 5.4 4.0 - 10.5 K/uL   RBC 2.52 (L) 3.87 - 5.11 MIL/uL   Hemoglobin 7.4 (L) 12.0 - 15.0 g/dL   HCT 22.3 (L) 36.0 - 46.0 %   MCV 88.5 78.0 - 100.0 fL   MCH 29.4 26.0 - 34.0 pg   MCHC 33.2 30.0 - 36.0 g/dL   RDW 15.4 11.5 - 15.5 %   Platelets 298 150 - 400 K/uL  I-stat troponin, ED     Status: None   Collection Time: 05/18/17  7:33 AM  Result Value Ref Range   Troponin i, poc 0.00 0.00 - 0.08 ng/mL   Comment 3  POC occult blood, ED Provider will collect     Status: Abnormal   Collection Time: 05/18/17 10:05 AM  Result Value Ref Range   Fecal Occult Bld POSITIVE (A) NEGATIVE     Dg Chest 2 View  Result Date: 05/18/2017 CLINICAL DATA:  Weakness and chest pain EXAM: CHEST  2 VIEW COMPARISON:  04/08/2017 FINDINGS: Cardiac shadow is within normal limits. The right hemidiaphragm is again elevated and stable. No acute bony abnormality is seen. No focal infiltrate is noted. IMPRESSION: No acute abnormality noted. Electronically Signed   By: Inez Catalina M.D.   On: 05/18/2017 07:41   Dg Sacrum/coccyx  Result Date: 05/18/2017 CLINICAL DATA:  Recent fall with buttock pain, initial encounter EXAM: SACRUM AND COCCYX - 2+ VIEW COMPARISON:  None. FINDINGS: There is no evidence of fracture or other focal bone lesions. IMPRESSION: No acute abnormality noted. Electronically Signed   By: Inez Catalina M.D.   On: 05/18/2017 09:23   Ct Head Wo Contrast  Result Date: 05/18/2017 CLINICAL DATA:  72 year old female with a history of fall EXAM: CT HEAD  WITHOUT CONTRAST CT CERVICAL SPINE WITHOUT CONTRAST TECHNIQUE: Multidetector CT imaging of the head and cervical spine was performed following the standard protocol without intravenous contrast. Multiplanar CT image reconstructions of the cervical spine were also generated. COMPARISON:  Multiple prior, most recent 03/20/2017, 03/19/2017, 03/31/2015, 12/12/2009 FINDINGS: CT HEAD FINDINGS Brain: No acute intracranial hemorrhage. No midline shift or mass effect. Gray-white differentiation maintained. Unremarkable appearance of the ventricular system. Vascular: Calcifications of the anterior circulation and posterior circulation. Skull: No acute fracture.  No aggressive bone lesion identified. Sinuses/Orbits: Frontal sinuses are clear. Fluid level of the left maxillary sinus. Frothy secretions of the right maxillary sinus. Rounded intermediate density material within the anterior right ethmoid air cell, contiguous from the inferior recess of the right frontal sinus. Other: None CT CERVICAL SPINE FINDINGS Alignment: Craniocervical junction aligned. Anatomic alignment of the cervical elements. No subluxation. Skull base and vertebrae: No acute fracture at the skullbase. Vertebral body heights relatively maintained. No acute fracture identified. Soft tissues and spinal canal: Lymph nodes present though not enlarged. Soft tissue nodule of the right thyroid measures 3.2 cm. No hematoma or soft tissue swelling. No canal hematoma. Disc levels: Mild disc space narrowing throughout the cervical spine with no bony canal narrowing or significant foraminal narrowing. Upper chest: Unremarkable appearance of the lung apices. Other: No bony canal narrowing. IMPRESSION: Head CT: No acute finding. Inspissated secretions or potentially a polyp of the anterior right ethmoid air cells. Air-fluid level of the left maxillary sinus with frothy secretions of the right sphenoid sinus, compatible with paranasal sinus disease. Cervical CT: No CT  evidence of acute fracture malalignment of the cervical spine. Right thyroid nodule, incompletely characterized by CT. If there is concern for further evaluation, recommend referral for outpatient ultrasound. Electronically Signed   By: Corrie Mckusick D.O.   On: 05/18/2017 10:38   Ct Cervical Spine Wo Contrast  Result Date: 05/18/2017 CLINICAL DATA:  72 year old female with a history of fall EXAM: CT HEAD WITHOUT CONTRAST CT CERVICAL SPINE WITHOUT CONTRAST TECHNIQUE: Multidetector CT imaging of the head and cervical spine was performed following the standard protocol without intravenous contrast. Multiplanar CT image reconstructions of the cervical spine were also generated. COMPARISON:  Multiple prior, most recent 03/20/2017, 03/19/2017, 03/31/2015, 12/12/2009 FINDINGS: CT HEAD FINDINGS Brain: No acute intracranial hemorrhage. No midline shift or mass effect. Gray-white differentiation maintained. Unremarkable appearance of the ventricular system. Vascular: Calcifications  of the anterior circulation and posterior circulation. Skull: No acute fracture.  No aggressive bone lesion identified. Sinuses/Orbits: Frontal sinuses are clear. Fluid level of the left maxillary sinus. Frothy secretions of the right maxillary sinus. Rounded intermediate density material within the anterior right ethmoid air cell, contiguous from the inferior recess of the right frontal sinus. Other: None CT CERVICAL SPINE FINDINGS Alignment: Craniocervical junction aligned. Anatomic alignment of the cervical elements. No subluxation. Skull base and vertebrae: No acute fracture at the skullbase. Vertebral body heights relatively maintained. No acute fracture identified. Soft tissues and spinal canal: Lymph nodes present though not enlarged. Soft tissue nodule of the right thyroid measures 3.2 cm. No hematoma or soft tissue swelling. No canal hematoma. Disc levels: Mild disc space narrowing throughout the cervical spine with no bony canal  narrowing or significant foraminal narrowing. Upper chest: Unremarkable appearance of the lung apices. Other: No bony canal narrowing. IMPRESSION: Head CT: No acute finding. Inspissated secretions or potentially a polyp of the anterior right ethmoid air cells. Air-fluid level of the left maxillary sinus with frothy secretions of the right sphenoid sinus, compatible with paranasal sinus disease. Cervical CT: No CT evidence of acute fracture malalignment of the cervical spine. Right thyroid nodule, incompletely characterized by CT. If there is concern for further evaluation, recommend referral for outpatient ultrasound. Electronically Signed   By: Corrie Mckusick D.O.   On: 05/18/2017 10:38    ROS:  As stated above in the HPI otherwise negative.  Blood pressure (!) 105/54, pulse 70, temperature 97.6 F (36.4 C), temperature source Oral, SpO2 97 %.    PE: Gen: NAD, Alert and Oriented HEENT:  [place]/AT, EOMI Neck: Supple, no LAD Lungs: CTA Bilaterally CV: RRR without M/G/R ABM: Soft, NTND, +BS Ext: No C/C/E  Assessment/Plan: 1) Symptomatic anemia. 2) Coffee-ground emesis. 3) Heme positive stool. 4) History of syncope.   Upon review of the available [facility] records she most likely has an esophagitis.  It is unclear why this has developed recently with this severe anemia as her HGB was stable one month ago.  She denies any significant GERD symptoms, but she was tender to palpation in her epigastrium.  It is likely that the source of bleeding is from her upper GI tract, but she needs a colonoscopy, in addition.  Ambulation at this time is an issue with her syncope and she is agreeable to using a rectal tube.  Plan: 1) EGD/Colonoscopy tomorrow. 2) She will most likely benefit with 1-2 units of PRBC. 3) PPI.  Ameshia Pewitt D 05/18/2017, 11:09 AM

## 2017-05-18 NOTE — ED Provider Notes (Addendum)
[place] EMERGENCY DEPARTMENT Provider Note   CSN: [id number] Arrival date & time: 05/18/17  0701     History   Chief Complaint Chief Complaint  Patient presents with  . Chest Pain    HPI Chloe Harding is a 72 y.o. female.  72yo F w/ PMH incluidng HTN, HLD, T2DM who p/w fall and chest pain. This morning she was in her bathroom and fell, striking her head and left side. No LOC. She reports headache and pain in her bottom since the fall. She reports some chest pain across her chest during the ambulance ride since the fall. She denies any SOB or DOE. No vomiting or diarrhea. She denies any bloody or black stools, or any h/o GI bleed. No anticoagulant use. She endorses lightheadedness.   The history is provided by the patient.  Chest Pain      Past Medical History:  Diagnosis Date  . Arthritis   . Depression   . Diabetes mellitus   . Hiatal hernia   . Hypercholesteremia   . Hypertension   . Reflux   . Renal disorder    shutting down 4 years ago    Patient Active Problem List   Diagnosis Date Noted  . Hypotension 03/30/2015  . Sepsis ([place]) 03/30/2015  . CAP (community acquired pneumonia) 03/30/2015  . Acute encephalopathy 03/30/2015  . Hyperkalemia 03/30/2015  . Acute kidney injury ([place]) 03/30/2015  . Nausea with vomiting 03/30/2015  . Diabetic neuropathy ([place]) 03/30/2015  . OBESITY 03/11/2010  . Type 2 diabetes mellitus ([place]) 12/26/2009  . Dyslipidemia 12/26/2009  . ANEMIA 12/26/2009  . DEPRESSION 12/26/2009  . HYPERTENSION, BENIGN ESSENTIAL 12/26/2009  . GERD 12/26/2009  . HIATAL HERNIA 12/26/2009  . CHRONIC KIDNEY DISEASE STAGE I 12/26/2009  . UTI 12/26/2009  . ARTHRITIS 12/26/2009  . ARTHROSCOPY, RIGHT KNEE, HX OF 12/26/2009    Past Surgical History:  Procedure Laterality Date  . CHOLECYSTECTOMY      OB History    No data available       Home Medications    Prior to Admission medications   Medication Sig Start Date End  Date Taking? Authorizing Provider  aspirin 81 MG tablet Take 81 mg by mouth daily.    [provider]  carvedilol (COREG) 12.5 MG tablet Take 12.5 mg by mouth 2 (two) times daily with a meal.    [provider]  cephALEXin (KEFLEX) 500 MG capsule Take 1 capsule (500 mg total) by mouth 4 (four) times daily. 04/08/17   Khatri, Hina, PA-C  citalopram (CELEXA) 40 MG tablet Take 40 mg by mouth daily.    [provider]  diltiazem (CARDIZEM) 30 MG tablet Take 30-60 mg by mouth 2 (two) times daily. 60 mg in the morning and 30mg  in the evening    [provider]  gabapentin (NEURONTIN) 600 MG tablet Take 600-1,200 mg by mouth 3 (three) times daily. 600mg  by mouth in the morning and 1200mg  by mouth at bedtime    [provider]  gemfibrozil (LOPID) 600 MG tablet Take 600 mg by mouth 2 (two) times daily before a meal.    [provider]  glipiZIDE (GLUCOTROL) 5 MG tablet Take 5 mg by mouth 2 (two) times daily before a meal.    [provider]  meloxicam (MOBIC) 15 MG tablet Take 15 mg by mouth daily.    [provider]  pravastatin (PRAVACHOL) 20 MG tablet Take 40 mg by mouth at bedtime.  [provider]  rOPINIRole (REQUIP) 2 MG tablet Take 2 mg by mouth at bedtime.    [provider]  saccharomyces boulardii (FLORASTOR) 250 MG capsule Take 1 capsule (250 mg total) by mouth 2 (two) times daily. Patient not taking: Reported on 12/29/2015 04/02/15   Lavina Hamman, MD  traMADol (ULTRAM) 50 MG tablet Take 50 mg by mouth every 6 (six) hours as needed for moderate pain.    [provider]    Family History History reviewed. No pertinent family history.  Social History Social History   Tobacco Use  . Smoking status: Never Smoker  . Smokeless tobacco: Never Used  Substance Use Topics  . Alcohol use: No  . Drug use: No     Allergies   Amlodipine besylate   Review of Systems Review of Systems    Cardiovascular: Positive for chest pain.   All other systems reviewed and are negative except that which was mentioned in HPI   Physical Exam Updated Vital Signs BP (!) 105/54 (BP Location: Left Arm)   Pulse 70   Temp 97.6 F (36.4 C) (Oral)   SpO2 97%   Physical Exam  Constitutional: She is oriented to person, place, and time. She appears well-developed and well-nourished. No distress.  HENT:  Head: Normocephalic and atraumatic.  dry mucous membranes  Eyes: Conjunctivae are normal. Pupils are equal, round, and reactive to light.  Pale conjunctivae  Neck: Normal range of motion. Neck supple.  No midline tenderness  Cardiovascular: Normal rate, regular rhythm and normal heart sounds.  No murmur heard. Pulmonary/Chest: Effort normal and breath sounds normal.  Abdominal: Soft. Bowel sounds are normal. She exhibits no distension. There is no tenderness.  No external hemorrhoids, gross blood, or melena  Genitourinary: Rectal exam shows guaiac positive stool.  Musculoskeletal: She exhibits no edema.       Right lower leg: She exhibits no tenderness.       Left lower leg: She exhibits no tenderness.  Neurological: She is alert and oriented to person, place, and time.  Fluent speech  Skin: Skin is warm and dry. There is pallor.  Old ecchymoses forearms  Psychiatric: Judgment normal.  Depressed mood  Nursing note and vitals reviewed. Chaperone was present during exam.    ED Treatments / Results  Labs (all labs ordered are listed, but only abnormal results are displayed) Labs Reviewed  BASIC METABOLIC PANEL - Abnormal; Notable for the following components:      Result Value   Sodium 132 (*)    Chloride 96 (*)    CO2 21 (*)    Glucose, Bld 136 (*)    BUN 71 (*)    Creatinine, Ser 2.05 (*)    GFR calc non Af Amer 23 (*)    GFR calc Af Amer 27 (*)    All other components within normal limits  CBC - Abnormal; Notable for the following components:   RBC 2.52 (*)     Hemoglobin 7.4 (*)    HCT 22.3 (*)    All other components within normal limits  POC OCCULT BLOOD, ED - Abnormal; Notable for the following components:   Fecal Occult Bld POSITIVE (*)    All other components within normal limits  URINALYSIS, ROUTINE W REFLEX MICROSCOPIC  HEMOGLOBIN A1C  I-STAT TROPONIN, ED  TYPE AND SCREEN  ABO/RH    EKG  EKG Interpretation  Date/Time:  Tuesday May 18 2017 07:13:23 EST Ventricular Rate:  70 PR Interval:  136  QRS Duration: 92 QT Interval:  396 QTC Calculation: 427 R Axis:   7 Text Interpretation:  Normal sinus rhythm Low voltage QRS Borderline ECG No significant change since last tracing Confirmed by Theotis Burrow [id number]) on 05/18/2017 7:57:01 AM       Radiology Dg Chest 2 View  Result Date: 05/18/2017 CLINICAL DATA:  Weakness and chest pain EXAM: CHEST  2 VIEW COMPARISON:  04/08/2017 FINDINGS: Cardiac shadow is within normal limits. The right hemidiaphragm is again elevated and stable. No acute bony abnormality is seen. No focal infiltrate is noted. IMPRESSION: No acute abnormality noted. Electronically Signed   By: Inez Catalina M.D.   On: 05/18/2017 07:41   Dg Sacrum/coccyx  Result Date: 05/18/2017 CLINICAL DATA:  Recent fall with buttock pain, initial encounter EXAM: SACRUM AND COCCYX - 2+ VIEW COMPARISON:  None. FINDINGS: There is no evidence of fracture or other focal bone lesions. IMPRESSION: No acute abnormality noted. Electronically Signed   By: Inez Catalina M.D.   On: 05/18/2017 09:23   Ct Head Wo Contrast  Result Date: 05/18/2017 CLINICAL DATA:  72 year old female with a history of fall EXAM: CT HEAD WITHOUT CONTRAST CT CERVICAL SPINE WITHOUT CONTRAST TECHNIQUE: Multidetector CT imaging of the head and cervical spine was performed following the standard protocol without intravenous contrast. Multiplanar CT image reconstructions of the cervical spine were also generated. COMPARISON:  Multiple prior, most recent 03/20/2017, 03/19/2017,  03/31/2015, 12/12/2009 FINDINGS: CT HEAD FINDINGS Brain: No acute intracranial hemorrhage. No midline shift or mass effect. Gray-white differentiation maintained. Unremarkable appearance of the ventricular system. Vascular: Calcifications of the anterior circulation and posterior circulation. Skull: No acute fracture.  No aggressive bone lesion identified. Sinuses/Orbits: Frontal sinuses are clear. Fluid level of the left maxillary sinus. Frothy secretions of the right maxillary sinus. Rounded intermediate density material within the anterior right ethmoid air cell, contiguous from the inferior recess of the right frontal sinus. Other: None CT CERVICAL SPINE FINDINGS Alignment: Craniocervical junction aligned. Anatomic alignment of the cervical elements. No subluxation. Skull base and vertebrae: No acute fracture at the skullbase. Vertebral body heights relatively maintained. No acute fracture identified. Soft tissues and spinal canal: Lymph nodes present though not enlarged. Soft tissue nodule of the right thyroid measures 3.2 cm. No hematoma or soft tissue swelling. No canal hematoma. Disc levels: Mild disc space narrowing throughout the cervical spine with no bony canal narrowing or significant foraminal narrowing. Upper chest: Unremarkable appearance of the lung apices. Other: No bony canal narrowing. IMPRESSION: Head CT: No acute finding. Inspissated secretions or potentially a polyp of the anterior right ethmoid air cells. Air-fluid level of the left maxillary sinus with frothy secretions of the right sphenoid sinus, compatible with paranasal sinus disease. Cervical CT: No CT evidence of acute fracture malalignment of the cervical spine. Right thyroid nodule, incompletely characterized by CT. If there is concern for further evaluation, recommend referral for outpatient ultrasound. Electronically Signed   By: Corrie Mckusick D.O.   On: 05/18/2017 10:38   Ct Cervical Spine Wo Contrast  Result Date:  05/18/2017 CLINICAL DATA:  72 year old female with a history of fall EXAM: CT HEAD WITHOUT CONTRAST CT CERVICAL SPINE WITHOUT CONTRAST TECHNIQUE: Multidetector CT imaging of the head and cervical spine was performed following the standard protocol without intravenous contrast. Multiplanar CT image reconstructions of the cervical spine were also generated. COMPARISON:  Multiple prior, most recent 03/20/2017, 03/19/2017, 03/31/2015, 12/12/2009 FINDINGS: CT HEAD FINDINGS Brain: No acute intracranial hemorrhage. No midline shift or mass effect.  Gray-white differentiation maintained. Unremarkable appearance of the ventricular system. Vascular: Calcifications of the anterior circulation and posterior circulation. Skull: No acute fracture.  No aggressive bone lesion identified. Sinuses/Orbits: Frontal sinuses are clear. Fluid level of the left maxillary sinus. Frothy secretions of the right maxillary sinus. Rounded intermediate density material within the anterior right ethmoid air cell, contiguous from the inferior recess of the right frontal sinus. Other: None CT CERVICAL SPINE FINDINGS Alignment: Craniocervical junction aligned. Anatomic alignment of the cervical elements. No subluxation. Skull base and vertebrae: No acute fracture at the skullbase. Vertebral body heights relatively maintained. No acute fracture identified. Soft tissues and spinal canal: Lymph nodes present though not enlarged. Soft tissue nodule of the right thyroid measures 3.2 cm. No hematoma or soft tissue swelling. No canal hematoma. Disc levels: Mild disc space narrowing throughout the cervical spine with no bony canal narrowing or significant foraminal narrowing. Upper chest: Unremarkable appearance of the lung apices. Other: No bony canal narrowing. IMPRESSION: Head CT: No acute finding. Inspissated secretions or potentially a polyp of the anterior right ethmoid air cells. Air-fluid level of the left maxillary sinus with frothy secretions of the  right sphenoid sinus, compatible with paranasal sinus disease. Cervical CT: No CT evidence of acute fracture malalignment of the cervical spine. Right thyroid nodule, incompletely characterized by CT. If there is concern for further evaluation, recommend referral for outpatient ultrasound. Electronically Signed   By: Corrie Mckusick D.O.   On: 05/18/2017 10:38    Procedures Procedures (including critical care time)  Medications Ordered in ED Medications  pantoprazole (PROTONIX) injection 40 mg (not administered)  0.9 %  sodium chloride infusion (not administered)  insulin aspart (novoLOG) injection 0-9 Units (not administered)  insulin aspart (novoLOG) injection 0-5 Units (not administered)  carvedilol (COREG) tablet 12.5 mg (not administered)  citalopram (CELEXA) tablet 40 mg (not administered)  diltiazem (CARDIZEM) tablet 30-60 mg (not administered)  gabapentin (NEURONTIN) tablet 600-1,200 mg (not administered)  rOPINIRole (REQUIP) tablet 2 mg (not administered)  polyethylene glycol-electrolytes (NuLYTELY/GoLYTELY) solution 4,000 mL (not administered)  sodium chloride flush (NS) 0.9 % injection 3 mL (not administered)  acetaminophen (TYLENOL) tablet 650 mg (not administered)    Or  acetaminophen (TYLENOL) suppository 650 mg (not administered)  ondansetron (ZOFRAN) tablet 4 mg (not administered)    Or  ondansetron (ZOFRAN) injection 4 mg (not administered)  sodium chloride 0.9 % bolus 1,000 mL (1,000 mLs Intravenous New Bag/Given 05/18/17 1128)     Initial Impression / Assessment and Plan / ED Course  I have reviewed the triage vital signs and the nursing notes.  Pertinent labs & imaging results that were available during my care of the patient were reviewed by me and considered in my medical decision making (see chart for details).    PT brought in by EMS after mechanical fall at home, hit head but no LOC. VS stable on arrival. No obvious signs of trauma, old bruises noted.  She  complained of some chest pain across her chest during the ambulance ride, obtained EKG which was reassuring and troponin normal.  Chest x-ray negative acute. CT head and C-spine as well as sacral XR negative for acute injury. Labs notable for creatinine of 2.05, BUN 71, significantly elevated from previous.  She is also had a hemoglobin drop from 12 a month ago to 7.4 today.  I am concerned about GI bleed, hemoccult was positive although no gross blood or melena on rectal exam. She admits to Bayer powder several times per  week for pain, ddx includes bleeding peptic ulcer. Discussed w/ [facility], they will see pt in consultation. Discussed admission with [facility], Ebony Hail, and patient admitted for further workup. Final Clinical Impressions(s) / ED Diagnoses   Final diagnoses:  Symptomatic anemia  Fall, initial encounter    ED Discharge Orders    None       Dinia Joynt, Wenda Overland, MD 05/18/17 [place], Wenda Overland, MD 05/18/17 1152

## 2017-05-18 NOTE — H&P (Signed)
History and Physical    Chloe Harding GBT:[id number] DOB: 07-21-1945 DOA: 05/18/2017   PCP: Nolene Ebbs, MD   Attending physician: Lorin Mercy  Patient coming from/Resides with: Private residence  Chief Complaint: Syncope  HPI: Chloe Harding is a 72 y.o. female with medical history significant for GERD/hiatal hernia with prior esophageal dilatation in 2013, hypertension, dyslipidemia, diabetes on oral agents, and mild chronic kidney disease stage II-III.  Patient also has chronic neck and back pain.  Patient had gotten up to go to the bathroom today at home when she "fell" and hit her head and left leg against the commode.  She was not incontinent of bowel or bladder and did not lose consciousness.  Recently she has been having increasing falls.  After today's incident she was brought to the ER where radiographic evaluation did not reveal any acute trauma.  Her blood pressure though appeared to be somewhat suboptimal for a patient with underlying hypertension noting that her initial blood pressure was 105/54 with an MAP of 61.  She was not hypoxemic.  She was not tachycardic.  Her hemoglobin was 7.4 which was significantly lower than the previous reading on 11/30 of 12.6.  Her platelets were normal.  She also had evidence of an acute kidney injury with a BUN of 71 and creatinine of 2.05 with previous reading BUN 24 and creatinine 1.00.  Point-of-care troponin normal.  She was experiencing epigastric discomfort with palpation and reported several weeks of nausea and anorexia.  FOB was positive.  Upon further questioning patient is uncertain if she may be had been having darker stools over the past 4-6 weeks but today remembers having more reddish colored stools.  Gastroenterology has been consulted and plans dual EGD/colonoscopy on 1/2.  Patient will be admitted to the stepdown unit for symptomatic anemia in the context of presumed upper GI bleeding.  ED Course:  Vital Signs: BP (!) 113/55 (BP  Location: Right Arm)   Pulse 70   Temp 97.6 F (36.4 C) (Oral)   Resp 12   SpO2 94%  CT head and cervical spine: No acute traumatic injuries; there was an air-fluid level of the left maxillary sinus with frothy secretions of the right sphenoid compatible with paranasal sinus disease; there was also a right thyroid nodule incompletely characterized by CT. DG sacrum/coccyx: Neg Chest x-ray: Neg Lab data: Sodium 132, potassium 4.5, chloride 96, CO2 21, glucose 136, BUN 71, creatinine 2.05, calcium 10, anion gap 15, poc troponin 0 0.00, white count 5400, differential not obtained, hemoglobin 7.4, platelets 298,000, FOB positive Medications and treatments: 1 L normal saline  Review of Systems:  In addition to the HPI above,  No Fever-chills, myalgias or other constitutional symptoms No Headache, changes with Vision or hearing, new weakness, tingling, numbness in any extremity, dysarthria or word finding difficulty, tremors or seizure activity No problems swallowing food or Liquids, indigestion/reflux, choking or coughing while eating, abdominal pain with or after eating No Cough or Shortness of Breath, palpitations, orthopnea or DOE No dysuria, malodorous urine, hematuria or flank pain No new skin rashes, lesions, masses or bruises, No new joint pains, aches, swelling or redness No recent unintentional weight gain or loss No polyuria, polydypsia or polyphagia   Past Medical History:  Diagnosis Date  . Arthritis   . Depression   . Diabetes mellitus   . Hiatal hernia   . Hypercholesteremia   . Hypertension   . Reflux   . Renal disorder    shutting  down 4 years ago    Past Surgical History:  Procedure Laterality Date  . CHOLECYSTECTOMY      Social History   Socioeconomic History  . Marital status: Married    Spouse name: Not on file  . Number of children: Not on file  . Years of education: Not on file  . Highest education level: Not on file  Social Needs  . Financial  resource strain: Not on file  . Food insecurity - worry: Not on file  . Food insecurity - inability: Not on file  . Transportation needs - medical: Not on file  . Transportation needs - non-medical: Not on file  Occupational History  . Not on file  Tobacco Use  . Smoking status: Never Smoker  . Smokeless tobacco: Never Used  Substance and Sexual Activity  . Alcohol use: No  . Drug use: No  . Sexual activity: Not Currently  Other Topics Concern  . Not on file  Social History Narrative  . Not on file    Mobility: Utilizes a cane and is very fearful of falling Work history: Not obtained   Allergies  Allergen Reactions  . Amlodipine Besylate Swelling    swelling    Family history reviewed and not pertinent to current admission findings and diagnosis including no family history of gastric cancer or colon cancer  Prior to Admission medications   Medication Sig Start Date End Date Taking? Authorizing Provider  aspirin 81 MG tablet Take 81 mg by mouth daily.   Yes [provider]  carvedilol (COREG) 12.5 MG tablet Take 12.5 mg by mouth 2 (two) times daily with a meal.   Yes [provider]  cephALEXin (KEFLEX) 500 MG capsule Take 1 capsule (500 mg total) by mouth 4 (four) times daily. 04/08/17   Khatri, Hina, PA-C  diltiazem (CARDIZEM) 30 MG tablet Take 30-60 mg by mouth 2 (two) times daily. 60 mg in the morning and 30mg  in the evening    [provider]  gabapentin (NEURONTIN) 600 MG tablet Take 600-1,200 mg by mouth 3 (three) times daily. 600mg  by mouth in the morning and 1200mg  by mouth at bedtime    [provider]  gemfibrozil (LOPID) 600 MG tablet Take 600 mg by mouth 2 (two) times daily before a meal.    [provider]  glipiZIDE (GLUCOTROL) 5 MG tablet Take 5 mg by mouth 2 (two) times daily before a meal.    [provider]  meloxicam (MOBIC) 15 MG tablet Take 15 mg by mouth daily.    [provider]    pravastatin (PRAVACHOL) 20 MG tablet Take 40 mg by mouth at bedtime.     [provider]  rOPINIRole (REQUIP) 2 MG tablet Take 2 mg by mouth at bedtime.    [provider]  saccharomyces boulardii (FLORASTOR) 250 MG capsule Take 1 capsule (250 mg total) by mouth 2 (two) times daily. Patient not taking: Reported on 12/29/2015 04/02/15   Lavina Hamman, MD  traMADol (ULTRAM) 50 MG tablet Take 50 mg by mouth every 6 (six) hours as needed for moderate pain.    [provider]    Physical Exam: Vitals:   05/18/17 1115 05/18/17 1145 05/18/17 1148 05/18/17 1215  BP: (!) 101/59 (!) 113/55 (!) 113/55 97/61  Pulse: 66 66 70 68  Resp: 15 13 12 17   Temp:      TempSrc:      SpO2: 99% 98% 94% 100%  Constitutional: Appears very pale, calm, uncomfortable 2/2 epigastric abdominal pain Eyes: PERRL, lids normal, conjunctivae are pale ENMT: Mucous membranes are dry. Posterior pharynx clear of any exudate or lesions. Poordentition.  Neck: normal, supple, no masses, no thyromegaly Respiratory: clear to auscultation bilaterally, no wheezing, no crackles. Normal respiratory effort. No accessory muscle use.  Cardiovascular: Regular rate and rhythm, no murmurs / rubs / gallops. No extremity edema. 2+ pedal pulses. No carotid bruits.  Systolic blood pressure in the 90s Abdomen: Focal epigastric tenderness, no masses palpated. No hepatosplenomegaly. Bowel sounds positive.  Abdomen is soft and nondistended Musculoskeletal: no clubbing / cyanosis. No joint deformity upper and lower extremities. Good ROM, no contractures. Normal muscle tone.  Skin: no rashes, lesions, ulcers. No induration-overall pale appearance Neurologic: CN 2-12 grossly intact. Sensation intact, DTR normal. Strength 5/5 x all 4 extremities.  Psychiatric: Normal judgment and insight. Alert and oriented x 3. Normal mood.    Labs on Admission: I have personally reviewed following labs and imaging  studies  CBC: Recent Labs  Lab 05/18/17 0718  WBC 5.4  HGB 7.4*  HCT 22.3*  MCV 88.5  PLT 161   Basic Metabolic Panel: Recent Labs  Lab 05/18/17 0718  NA 132*  K 4.5  CL 96*  CO2 21*  GLUCOSE 136*  BUN 71*  CREATININE 2.05*  CALCIUM 10.0   GFR: CrCl cannot be calculated (Unknown ideal weight.). Liver Function Tests: No results for input(s): AST, ALT, ALKPHOS, BILITOT, PROT, ALBUMIN in the last 168 hours. No results for input(s): LIPASE, AMYLASE in the last 168 hours. No results for input(s): AMMONIA in the last 168 hours. Coagulation Profile: No results for input(s): INR, PROTIME in the last 168 hours. Cardiac Enzymes: No results for input(s): CKTOTAL, CKMB, CKMBINDEX, TROPONINI in the last 168 hours. BNP (last 3 results) No results for input(s): PROBNP in the last 8760 hours. HbA1C: No results for input(s): HGBA1C in the last 72 hours. CBG: Recent Labs  Lab 05/18/17 1211  GLUCAP 99   Lipid Profile: No results for input(s): CHOL, HDL, LDLCALC, TRIG, CHOLHDL, LDLDIRECT in the last 72 hours. Thyroid Function Tests: No results for input(s): TSH, T4TOTAL, FREET4, T3FREE, THYROIDAB in the last 72 hours. Anemia Panel: No results for input(s): VITAMINB12, FOLATE, FERRITIN, TIBC, IRON, RETICCTPCT in the last 72 hours. Urine analysis:    Component Value Date/Time   COLORURINE YELLOW 04/08/2017 1513   APPEARANCEUR CLOUDY (A) 04/08/2017 1513   LABSPEC 1.020 04/08/2017 1513   PHURINE 6.0 04/08/2017 1513   GLUCOSEU NEGATIVE 04/08/2017 1513   HGBUR SMALL (A) 04/08/2017 1513   HGBUR trace-intact 05/14/2010 1402   BILIRUBINUR NEGATIVE 04/08/2017 1513   KETONESUR NEGATIVE 04/08/2017 1513   PROTEINUR TRACE (A) 04/08/2017 1513   UROBILINOGEN 1.0 03/30/2015 1540   NITRITE NEGATIVE 04/08/2017 1513   LEUKOCYTESUR MODERATE (A) 04/08/2017 1513   Sepsis Labs: @LABRCNTIP (procalcitonin:4,lacticidven:4) )No results found for this or any previous visit (from the past 240  hour(s)).   Radiological Exams on Admission: Dg Chest 2 View  Result Date: 05/18/2017 CLINICAL DATA:  Weakness and chest pain EXAM: CHEST  2 VIEW COMPARISON:  04/08/2017 FINDINGS: Cardiac shadow is within normal limits. The right hemidiaphragm is again elevated and stable. No acute bony abnormality is seen. No focal infiltrate is noted. IMPRESSION: No acute abnormality noted. Electronically Signed   By: Inez Catalina M.D.   On: 05/18/2017 07:41   Dg Sacrum/coccyx  Result Date: 05/18/2017 CLINICAL DATA:  Recent fall with buttock pain, initial encounter EXAM:  SACRUM AND COCCYX - 2+ VIEW COMPARISON:  None. FINDINGS: There is no evidence of fracture or other focal bone lesions. IMPRESSION: No acute abnormality noted. Electronically Signed   By: Inez Catalina M.D.   On: 05/18/2017 09:23   Ct Head Wo Contrast  Result Date: 05/18/2017 CLINICAL DATA:  72 year old female with a history of fall EXAM: CT HEAD WITHOUT CONTRAST CT CERVICAL SPINE WITHOUT CONTRAST TECHNIQUE: Multidetector CT imaging of the head and cervical spine was performed following the standard protocol without intravenous contrast. Multiplanar CT image reconstructions of the cervical spine were also generated. COMPARISON:  Multiple prior, most recent 03/20/2017, 03/19/2017, 03/31/2015, 12/12/2009 FINDINGS: CT HEAD FINDINGS Brain: No acute intracranial hemorrhage. No midline shift or mass effect. Gray-white differentiation maintained. Unremarkable appearance of the ventricular system. Vascular: Calcifications of the anterior circulation and posterior circulation. Skull: No acute fracture.  No aggressive bone lesion identified. Sinuses/Orbits: Frontal sinuses are clear. Fluid level of the left maxillary sinus. Frothy secretions of the right maxillary sinus. Rounded intermediate density material within the anterior right ethmoid air cell, contiguous from the inferior recess of the right frontal sinus. Other: None CT CERVICAL SPINE FINDINGS Alignment:  Craniocervical junction aligned. Anatomic alignment of the cervical elements. No subluxation. Skull base and vertebrae: No acute fracture at the skullbase. Vertebral body heights relatively maintained. No acute fracture identified. Soft tissues and spinal canal: Lymph nodes present though not enlarged. Soft tissue nodule of the right thyroid measures 3.2 cm. No hematoma or soft tissue swelling. No canal hematoma. Disc levels: Mild disc space narrowing throughout the cervical spine with no bony canal narrowing or significant foraminal narrowing. Upper chest: Unremarkable appearance of the lung apices. Other: No bony canal narrowing. IMPRESSION: Head CT: No acute finding. Inspissated secretions or potentially a polyp of the anterior right ethmoid air cells. Air-fluid level of the left maxillary sinus with frothy secretions of the right sphenoid sinus, compatible with paranasal sinus disease. Cervical CT: No CT evidence of acute fracture malalignment of the cervical spine. Right thyroid nodule, incompletely characterized by CT. If there is concern for further evaluation, recommend referral for outpatient ultrasound. Electronically Signed   By: Corrie Mckusick D.O.   On: 05/18/2017 10:38   Ct Cervical Spine Wo Contrast  Result Date: 05/18/2017 CLINICAL DATA:  72 year old female with a history of fall EXAM: CT HEAD WITHOUT CONTRAST CT CERVICAL SPINE WITHOUT CONTRAST TECHNIQUE: Multidetector CT imaging of the head and cervical spine was performed following the standard protocol without intravenous contrast. Multiplanar CT image reconstructions of the cervical spine were also generated. COMPARISON:  Multiple prior, most recent 03/20/2017, 03/19/2017, 03/31/2015, 12/12/2009 FINDINGS: CT HEAD FINDINGS Brain: No acute intracranial hemorrhage. No midline shift or mass effect. Gray-white differentiation maintained. Unremarkable appearance of the ventricular system. Vascular: Calcifications of the anterior circulation and  posterior circulation. Skull: No acute fracture.  No aggressive bone lesion identified. Sinuses/Orbits: Frontal sinuses are clear. Fluid level of the left maxillary sinus. Frothy secretions of the right maxillary sinus. Rounded intermediate density material within the anterior right ethmoid air cell, contiguous from the inferior recess of the right frontal sinus. Other: None CT CERVICAL SPINE FINDINGS Alignment: Craniocervical junction aligned. Anatomic alignment of the cervical elements. No subluxation. Skull base and vertebrae: No acute fracture at the skullbase. Vertebral body heights relatively maintained. No acute fracture identified. Soft tissues and spinal canal: Lymph nodes present though not enlarged. Soft tissue nodule of the right thyroid measures 3.2 cm. No hematoma or soft tissue swelling. No canal hematoma.  Disc levels: Mild disc space narrowing throughout the cervical spine with no bony canal narrowing or significant foraminal narrowing. Upper chest: Unremarkable appearance of the lung apices. Other: No bony canal narrowing. IMPRESSION: Head CT: No acute finding. Inspissated secretions or potentially a polyp of the anterior right ethmoid air cells. Air-fluid level of the left maxillary sinus with frothy secretions of the right sphenoid sinus, compatible with paranasal sinus disease. Cervical CT: No CT evidence of acute fracture malalignment of the cervical spine. Right thyroid nodule, incompletely characterized by CT. If there is concern for further evaluation, recommend referral for outpatient ultrasound. Electronically Signed   By: Corrie Mckusick D.O.   On: 05/18/2017 10:38    EKG: (Independently reviewed) sinus rhythm with ventricular rate 70 bpm, QTC 427 ms, normal R wave rotation, no acute ischemic changes, essentially unchanged from previous EKG  Assessment/Plan Principal Problem:   Symptomatic anemia -Patient presents after "fall" at home which sounds more like a syncopal episode in the  context of significant drop of hemoglobin from 12.6 on 11/30 to 7.4 today -Treat underlying causes (see below) -Transfused 2 units PRBCs and repeat CBC posttransfusion-may require PRBCs -CBC in a.m. and prn if bleeding becomes more brisk and/or hypotension ensues  Active Problems:   Acute GI bleeding-  Hiatal hernia/History of esophageal stricture and esophagitis -Suspect upper GI source (see below): Epigastric discomfort, recent excessive use of aspirin-containing products (Bayer arthritis, Alka-Seltzer) and elevated BUN -GI/Hung has evaluated and plans for a combo EGD/colonoscopy in a.m. -Clear liquids then NPO midnight -IV Protonix 40 mg twice daily -IV Pepcid 20 mg IV twice daily -Preadmission baby aspirin    Acute-on-chronic kidney injury  -Baseline creatinine 1.0 with a GFR>/= 45 -Current creatinine 2.05 with a GFR of 23 -Suspect secondary to hypoperfusion in context of symptomatic anemia and suspected orthostatic hypotension -Has an associated metabolic acidemia with borderline anion gap -Follow electrolytes    Hypertension -Current blood pressure suboptimal in the context of symptomatic anemia -Patient reports that after ER visit on 11/30 for unexplained weakness her daughter suspected her blood pressure medicines were contributing so stopped her lisinopril    Hypercholesteremia -Hold preadmission Pravachol     Diabetes mellitus -Hold preadmission glipizide -Follow CBGs -Provide SSI -HgbA1c    Solitary thyroid nodule /right -Incidental finding on CT cervical spine with documented "incomplete imaging" with radiologist documenting if any concern regarding further evaluation ultrasound is recommended -TSH      DVT prophylaxis: SCDs Code Status: Full Family Communication: Husband Disposition Plan: Home Consults called: GI/Hung    ELLIS,ALLISON L. ANP-BC [facility] Pager [phone]   If 7PM-7AM, please contact night-coverage [email or web address] Password  [facility]  05/18/2017, 12:32 PM

## 2017-05-18 NOTE — ED Notes (Signed)
Pt in xray. Xray was notified to bring to room.

## 2017-05-18 NOTE — H&P (View-Only) (Signed)
Reason for Consult: Symptomatic anemia Referring Physician: [facility]  Chloe Harding HPI: The patient was at home when she complained about chest pain and had a syncopal episode.  Upon arrival to the ER she was found to have a significant drop in her HGB from 12.6 g/dL (04/16/2017) down to 7.4 g/dL.  Her stool was noted to be heme positive.  Her syncopal episode is not the first time this has occurred.  She has had this issue several times since September last year and she is undergoing work up.  Currently she denies any issues with chest pain.  This past Saturday she reported coffee-ground emesis, but she denies any problems with GERD.  In 2013 she had issues with dysphagia and she was evaluated at [facility] where she underwent several EGDs with dilation.  Of the available reports there was evidence of an esophagitis with the biopsies.  She reports being on a PPI, but she was not very certain when pressed further about this issue.  The patient denies any prior colonoscopies and she denies any episodes of hematochezia or melena.  Past Medical History:  Diagnosis Date  . Arthritis   . Depression   . Diabetes mellitus   . Hiatal hernia   . Hypercholesteremia   . Hypertension   . Reflux   . Renal disorder    shutting down 4 years ago    Past Surgical History:  Procedure Laterality Date  . CHOLECYSTECTOMY      History reviewed. No pertinent family history.  Social History:  reports that  has never smoked. she has never used smokeless tobacco. She reports that she does not drink alcohol or use drugs.  Allergies:  Allergies  Allergen Reactions  . Amlodipine Besylate     swelling    Medications:  Scheduled:  Continuous: . sodium chloride      Results for orders placed or performed during the hospital encounter of 05/18/17 (from the past 24 hour(s))  Basic metabolic panel     Status: Abnormal   Collection Time: 05/18/17  7:18 AM  Result Value Ref Range   Sodium 132  (L) 135 - 145 mmol/L   Potassium 4.5 3.5 - 5.1 mmol/L   Chloride 96 (L) 101 - 111 mmol/L   CO2 21 (L) 22 - 32 mmol/L   Glucose, Bld 136 (H) 65 - 99 mg/dL   BUN 71 (H) 6 - 20 mg/dL   Creatinine, Ser 2.05 (H) 0.44 - 1.00 mg/dL   Calcium 10.0 8.9 - 10.3 mg/dL   GFR calc non Af Amer 23 (L) >60 mL/min   GFR calc Af Amer 27 (L) >60 mL/min   Anion gap 15 5 - 15  CBC     Status: Abnormal   Collection Time: 05/18/17  7:18 AM  Result Value Ref Range   WBC 5.4 4.0 - 10.5 K/uL   RBC 2.52 (L) 3.87 - 5.11 MIL/uL   Hemoglobin 7.4 (L) 12.0 - 15.0 g/dL   HCT 22.3 (L) 36.0 - 46.0 %   MCV 88.5 78.0 - 100.0 fL   MCH 29.4 26.0 - 34.0 pg   MCHC 33.2 30.0 - 36.0 g/dL   RDW 15.4 11.5 - 15.5 %   Platelets 298 150 - 400 K/uL  I-stat troponin, ED     Status: None   Collection Time: 05/18/17  7:33 AM  Result Value Ref Range   Troponin i, poc 0.00 0.00 - 0.08 ng/mL   Comment 3  POC occult blood, ED Provider will collect     Status: Abnormal   Collection Time: 05/18/17 10:05 AM  Result Value Ref Range   Fecal Occult Bld POSITIVE (A) NEGATIVE     Dg Chest 2 View  Result Date: 05/18/2017 CLINICAL DATA:  Weakness and chest pain EXAM: CHEST  2 VIEW COMPARISON:  04/08/2017 FINDINGS: Cardiac shadow is within normal limits. The right hemidiaphragm is again elevated and stable. No acute bony abnormality is seen. No focal infiltrate is noted. IMPRESSION: No acute abnormality noted. Electronically Signed   By: Inez Catalina M.D.   On: 05/18/2017 07:41   Dg Sacrum/coccyx  Result Date: 05/18/2017 CLINICAL DATA:  Recent fall with buttock pain, initial encounter EXAM: SACRUM AND COCCYX - 2+ VIEW COMPARISON:  None. FINDINGS: There is no evidence of fracture or other focal bone lesions. IMPRESSION: No acute abnormality noted. Electronically Signed   By: Inez Catalina M.D.   On: 05/18/2017 09:23   Ct Head Wo Contrast  Result Date: 05/18/2017 CLINICAL DATA:  72 year old female with a history of fall EXAM: CT HEAD  WITHOUT CONTRAST CT CERVICAL SPINE WITHOUT CONTRAST TECHNIQUE: Multidetector CT imaging of the head and cervical spine was performed following the standard protocol without intravenous contrast. Multiplanar CT image reconstructions of the cervical spine were also generated. COMPARISON:  Multiple prior, most recent 03/20/2017, 03/19/2017, 03/31/2015, 12/12/2009 FINDINGS: CT HEAD FINDINGS Brain: No acute intracranial hemorrhage. No midline shift or mass effect. Gray-white differentiation maintained. Unremarkable appearance of the ventricular system. Vascular: Calcifications of the anterior circulation and posterior circulation. Skull: No acute fracture.  No aggressive bone lesion identified. Sinuses/Orbits: Frontal sinuses are clear. Fluid level of the left maxillary sinus. Frothy secretions of the right maxillary sinus. Rounded intermediate density material within the anterior right ethmoid air cell, contiguous from the inferior recess of the right frontal sinus. Other: None CT CERVICAL SPINE FINDINGS Alignment: Craniocervical junction aligned. Anatomic alignment of the cervical elements. No subluxation. Skull base and vertebrae: No acute fracture at the skullbase. Vertebral body heights relatively maintained. No acute fracture identified. Soft tissues and spinal canal: Lymph nodes present though not enlarged. Soft tissue nodule of the right thyroid measures 3.2 cm. No hematoma or soft tissue swelling. No canal hematoma. Disc levels: Mild disc space narrowing throughout the cervical spine with no bony canal narrowing or significant foraminal narrowing. Upper chest: Unremarkable appearance of the lung apices. Other: No bony canal narrowing. IMPRESSION: Head CT: No acute finding. Inspissated secretions or potentially a polyp of the anterior right ethmoid air cells. Air-fluid level of the left maxillary sinus with frothy secretions of the right sphenoid sinus, compatible with paranasal sinus disease. Cervical CT: No CT  evidence of acute fracture malalignment of the cervical spine. Right thyroid nodule, incompletely characterized by CT. If there is concern for further evaluation, recommend referral for outpatient ultrasound. Electronically Signed   By: Corrie Mckusick D.O.   On: 05/18/2017 10:38   Ct Cervical Spine Wo Contrast  Result Date: 05/18/2017 CLINICAL DATA:  72 year old female with a history of fall EXAM: CT HEAD WITHOUT CONTRAST CT CERVICAL SPINE WITHOUT CONTRAST TECHNIQUE: Multidetector CT imaging of the head and cervical spine was performed following the standard protocol without intravenous contrast. Multiplanar CT image reconstructions of the cervical spine were also generated. COMPARISON:  Multiple prior, most recent 03/20/2017, 03/19/2017, 03/31/2015, 12/12/2009 FINDINGS: CT HEAD FINDINGS Brain: No acute intracranial hemorrhage. No midline shift or mass effect. Gray-white differentiation maintained. Unremarkable appearance of the ventricular system. Vascular: Calcifications  of the anterior circulation and posterior circulation. Skull: No acute fracture.  No aggressive bone lesion identified. Sinuses/Orbits: Frontal sinuses are clear. Fluid level of the left maxillary sinus. Frothy secretions of the right maxillary sinus. Rounded intermediate density material within the anterior right ethmoid air cell, contiguous from the inferior recess of the right frontal sinus. Other: None CT CERVICAL SPINE FINDINGS Alignment: Craniocervical junction aligned. Anatomic alignment of the cervical elements. No subluxation. Skull base and vertebrae: No acute fracture at the skullbase. Vertebral body heights relatively maintained. No acute fracture identified. Soft tissues and spinal canal: Lymph nodes present though not enlarged. Soft tissue nodule of the right thyroid measures 3.2 cm. No hematoma or soft tissue swelling. No canal hematoma. Disc levels: Mild disc space narrowing throughout the cervical spine with no bony canal  narrowing or significant foraminal narrowing. Upper chest: Unremarkable appearance of the lung apices. Other: No bony canal narrowing. IMPRESSION: Head CT: No acute finding. Inspissated secretions or potentially a polyp of the anterior right ethmoid air cells. Air-fluid level of the left maxillary sinus with frothy secretions of the right sphenoid sinus, compatible with paranasal sinus disease. Cervical CT: No CT evidence of acute fracture malalignment of the cervical spine. Right thyroid nodule, incompletely characterized by CT. If there is concern for further evaluation, recommend referral for outpatient ultrasound. Electronically Signed   By: Corrie Mckusick D.O.   On: 05/18/2017 10:38    ROS:  As stated above in the HPI otherwise negative.  Blood pressure (!) 105/54, pulse 70, temperature 97.6 F (36.4 C), temperature source Oral, SpO2 97 %.    PE: Gen: NAD, Alert and Oriented HEENT:  Bells/AT, EOMI Neck: Supple, no LAD Lungs: CTA Bilaterally CV: RRR without M/G/R ABM: Soft, NTND, +BS Ext: No C/C/E  Assessment/Plan: 1) Symptomatic anemia. 2) Coffee-ground emesis. 3) Heme positive stool. 4) History of syncope.   Upon review of the available [facility] records she most likely has an esophagitis.  It is unclear why this has developed recently with this severe anemia as her HGB was stable one month ago.  She denies any significant GERD symptoms, but she was tender to palpation in her epigastrium.  It is likely that the source of bleeding is from her upper GI tract, but she needs a colonoscopy, in addition.  Ambulation at this time is an issue with her syncope and she is agreeable to using a rectal tube.  Plan: 1) EGD/Colonoscopy tomorrow. 2) She will most likely benefit with 1-2 units of PRBC. 3) PPI.  Abriel Hattery D 05/18/2017, 11:09 AM

## 2017-05-19 ENCOUNTER — Encounter (HOSPITAL_COMMUNITY): Payer: Self-pay

## 2017-05-19 ENCOUNTER — Observation Stay (HOSPITAL_COMMUNITY): Payer: Medicare HMO | Admitting: Certified Registered Nurse Anesthetist

## 2017-05-19 ENCOUNTER — Encounter (HOSPITAL_COMMUNITY)
Admission: EM | Disposition: A | Discharge status: Home / Self Care | Payer: Self-pay | Source: Home / Self Care | Attending: Emergency Medicine

## 2017-05-19 ENCOUNTER — Other Ambulatory Visit: Payer: Medicare HMO

## 2017-05-19 DIAGNOSIS — E041 Nontoxic single thyroid nodule: Secondary | ICD-10-CM

## 2017-05-19 DIAGNOSIS — R0789 Other chest pain: Secondary | ICD-10-CM | POA: Diagnosis not present

## 2017-05-19 DIAGNOSIS — I159 Secondary hypertension, unspecified: Secondary | ICD-10-CM

## 2017-05-19 DIAGNOSIS — K5140 Inflammatory polyps of colon without complications: Secondary | ICD-10-CM | POA: Diagnosis not present

## 2017-05-19 DIAGNOSIS — Z8719 Personal history of other diseases of the digestive system: Secondary | ICD-10-CM

## 2017-05-19 DIAGNOSIS — K922 Gastrointestinal hemorrhage, unspecified: Secondary | ICD-10-CM | POA: Diagnosis not present

## 2017-05-19 DIAGNOSIS — R195 Other fecal abnormalities: Secondary | ICD-10-CM | POA: Diagnosis not present

## 2017-05-19 DIAGNOSIS — N179 Acute kidney failure, unspecified: Secondary | ICD-10-CM

## 2017-05-19 DIAGNOSIS — N183 Chronic kidney disease, stage 3 (moderate): Secondary | ICD-10-CM

## 2017-05-19 DIAGNOSIS — K5730 Diverticulosis of large intestine without perforation or abscess without bleeding: Secondary | ICD-10-CM | POA: Diagnosis not present

## 2017-05-19 DIAGNOSIS — D649 Anemia, unspecified: Secondary | ICD-10-CM | POA: Diagnosis not present

## 2017-05-19 HISTORY — PX: ESOPHAGOGASTRODUODENOSCOPY: SHX5428

## 2017-05-19 LAB — CBC
HCT: 31.1 % — ABNORMAL LOW (ref 36.0–46.0)
HEMATOCRIT: 32.7 % — AB (ref 36.0–46.0)
HEMOGLOBIN: 11.2 g/dL — AB (ref 12.0–15.0)
Hemoglobin: 10.4 g/dL — ABNORMAL LOW (ref 12.0–15.0)
MCH: 29.9 pg (ref 26.0–34.0)
MCH: 29.5 pg (ref 26.0–34.0)
MCHC: 34.3 g/dL (ref 30.0–36.0)
MCHC: 33.4 g/dL (ref 30.0–36.0)
MCV: 87.4 fL (ref 78.0–100.0)
MCV: 88.1 fL (ref 78.0–100.0)
PLATELETS: 268 10*3/uL (ref 150–400)
Platelets: 264 10*3/uL (ref 150–400)
RBC: 3.74 MIL/uL — ABNORMAL LOW (ref 3.87–5.11)
RBC: 3.53 MIL/uL — AB (ref 3.87–5.11)
RDW: 15.5 % (ref 11.5–15.5)
RDW: 15.9 % — AB (ref 11.5–15.5)
WBC: 5.2 10*3/uL (ref 4.0–10.5)
WBC: 4.1 10*3/uL (ref 4.0–10.5)

## 2017-05-19 LAB — COMPREHENSIVE METABOLIC PANEL
ALT: 26 U/L (ref 14–54)
AST: 35 U/L (ref 15–41)
Albumin: 2.7 g/dL — ABNORMAL LOW (ref 3.5–5.0)
Alkaline Phosphatase: 133 U/L — ABNORMAL HIGH (ref 38–126)
Anion gap: 8 (ref 5–15)
BILIRUBIN TOTAL: 1 mg/dL (ref 0.3–1.2)
BUN: 36 mg/dL — AB (ref 6–20)
CHLORIDE: 111 mmol/L (ref 101–111)
CO2: 21 mmol/L — ABNORMAL LOW (ref 22–32)
CREATININE: 1.32 mg/dL — AB (ref 0.44–1.00)
Calcium: 8.8 mg/dL — ABNORMAL LOW (ref 8.9–10.3)
GFR, EST AFRICAN AMERICAN: 46 mL/min — AB (ref >60)
GFR, EST NON AFRICAN AMERICAN: 40 mL/min — AB (ref >60)
Glucose, Bld: 88 mg/dL (ref 65–99)
Potassium: 3.6 mmol/L (ref 3.5–5.1)
Sodium: 140 mmol/L (ref 135–145)
TOTAL PROTEIN: 6.5 g/dL (ref 6.5–8.1)

## 2017-05-19 LAB — BPAM RBC
BLOOD PRODUCT EXPIRATION DATE: 201901252359
Blood Product Expiration Date: 201901242359
ISSUE DATE / TIME: 201901011803
ISSUE DATE / TIME: 201901011330
Unit Type and Rh: 5100
Unit Type and Rh: 5100

## 2017-05-19 LAB — TYPE AND SCREEN
ABO/RH(D): O POS
Antibody Screen: NEGATIVE
Unit division: 0
Unit division: 0

## 2017-05-19 LAB — GLUCOSE, CAPILLARY
GLUCOSE-CAPILLARY: 58 mg/dL — AB (ref 65–99)
GLUCOSE-CAPILLARY: 72 mg/dL (ref 65–99)
Glucose-Capillary: 80 mg/dL (ref 65–99)
Glucose-Capillary: 102 mg/dL — ABNORMAL HIGH (ref 65–99)

## 2017-05-19 LAB — TSH: TSH: 0.618 u[IU]/mL (ref 0.350–4.500)

## 2017-05-19 SURGERY — EGD (ESOPHAGOGASTRODUODENOSCOPY)
Anesthesia: Monitor Anesthesia Care

## 2017-05-19 MED ORDER — LACTATED RINGERS IV SOLN
INTRAVENOUS | Status: DC | PRN
  Administered 2017-05-19: 13:00:00 via INTRAVENOUS

## 2017-05-19 MED ORDER — PROPOFOL 500 MG/50ML IV EMUL
INTRAVENOUS | Status: DC | PRN
  Administered 2017-05-19: 100 ug/kg/min via INTRAVENOUS

## 2017-05-19 MED ORDER — PEG 3350-KCL-NA BICARB-NACL 420 G PO SOLR
4000.0000 mL | Freq: Once | ORAL | Status: AC
  Administered 2017-05-19: 4000 mL via ORAL
  Filled 2017-05-19: qty 4000

## 2017-05-19 MED ORDER — PROPOFOL 10 MG/ML IV BOLUS
INTRAVENOUS | Status: DC | PRN
  Administered 2017-05-19: 40 mg via INTRAVENOUS
  Administered 2017-05-19: 30 mg via INTRAVENOUS

## 2017-05-19 SURGICAL SUPPLY — 21 items

## 2017-05-19 NOTE — H&P (Signed)
Chloe Harding is an 72 y.o. female.   Chief Complaint: Anemia. HPI: 72 year old here or an EGD and a colonoscopy. Patient's still has solid stool in the colon and therefore her prep is not complete. Therefore only an EGD will be done today  Past Medical History:  Diagnosis Date  . Arthritis   . Depression   . Diabetes mellitus   . Hiatal hernia   . Hypercholesteremia   . Hypertension   . Reflux   . Renal disorder    shutting down 4 years ago   Past Surgical History:  Procedure Laterality Date  . CHOLECYSTECTOMY     History reviewed. No pertinent family history. Social History:  reports that  has never smoked. she has never used smokeless tobacco. She reports that she does not drink alcohol or use drugs.  Allergies:  Allergies  Allergen Reactions  . Amlodipine Besylate Swelling    swelling   Medications Prior to Admission  Medication Sig Dispense Refill  . amitriptyline (ELAVIL) 25 MG tablet Take 25 mg by mouth at bedtime.    Marland Kitchen aspirin 81 MG tablet Take 81 mg by mouth daily.    Marland Kitchen gabapentin (NEURONTIN) 600 MG tablet Take 600 mg by mouth 3 (three) times daily.     Marland Kitchen glipiZIDE (GLUCOTROL) 5 MG tablet Take 5 mg by mouth 2 (two) times daily before a meal.    . lisinopril (PRINIVIL,ZESTRIL) 10 MG tablet Take 10 mg by mouth daily.    . meloxicam (MOBIC) 15 MG tablet Take 15 mg by mouth daily.    . pravastatin (PRAVACHOL) 20 MG tablet Take 20 mg by mouth at bedtime.     Marland Kitchen rOPINIRole (REQUIP) 2 MG tablet Take 2 mg by mouth at bedtime.    . traMADol (ULTRAM) 50 MG tablet Take 50 mg by mouth 3 (three) times daily.     Marland Kitchen saccharomyces boulardii (FLORASTOR) 250 MG capsule Take 1 capsule (250 mg total) by mouth 2 (two) times daily. (Patient not taking: Reported on 12/29/2015) 14 capsule 0    Results for orders placed or performed during the hospital encounter of 05/18/17 (from the past 48 hour(s))  Basic metabolic panel     Status: Abnormal   Collection Time: 05/18/17  7:18 AM  Result  Value Ref Range   Sodium 132 (L) 135 - 145 mmol/L   Potassium 4.5 3.5 - 5.1 mmol/L   Chloride 96 (L) 101 - 111 mmol/L   CO2 21 (L) 22 - 32 mmol/L   Glucose, Bld 136 (H) 65 - 99 mg/dL   BUN 71 (H) 6 - 20 mg/dL   Creatinine, Ser 2.05 (H) 0.44 - 1.00 mg/dL   Calcium 10.0 8.9 - 10.3 mg/dL   GFR calc non Af Amer 23 (L) >60 mL/min   GFR calc Af Amer 27 (L) >60 mL/min    Comment: (NOTE) The eGFR has been calculated using the CKD EPI equation. This calculation has not been validated in all clinical situations. eGFR's persistently <60 mL/min signify possible Chronic Kidney Disease.    Anion gap 15 5 - 15  CBC     Status: Abnormal   Collection Time: 05/18/17  7:18 AM  Result Value Ref Range   WBC 5.4 4.0 - 10.5 K/uL   RBC 2.52 (L) 3.87 - 5.11 MIL/uL   Hemoglobin 7.4 (L) 12.0 - 15.0 g/dL   HCT 22.3 (L) 36.0 - 46.0 %   MCV 88.5 78.0 - 100.0 fL   MCH 29.4  26.0 - 34.0 pg   MCHC 33.2 30.0 - 36.0 g/dL   RDW 15.4 11.5 - 15.5 %   Platelets 298 150 - 400 K/uL  I-stat troponin, ED     Status: None   Collection Time: 05/18/17  7:33 AM  Result Value Ref Range   Troponin i, poc 0.00 0.00 - 0.08 ng/mL   Comment 3            Comment: Due to the release kinetics of cTnI, a negative result within the first hours of the onset of symptoms does not rule out myocardial infarction with certainty. If myocardial infarction is still suspected, repeat the test at appropriate intervals.   Urinalysis, Routine w reflex microscopic     Status: Abnormal   Collection Time: 05/18/17  8:42 AM  Result Value Ref Range   Color, Urine YELLOW YELLOW   APPearance HAZY (A) CLEAR   Specific Gravity, Urine 1.006 1.005 - 1.030   pH 5.0 5.0 - 8.0   Glucose, UA NEGATIVE NEGATIVE mg/dL   Hgb urine dipstick NEGATIVE NEGATIVE   Bilirubin Urine NEGATIVE NEGATIVE   Ketones, ur NEGATIVE NEGATIVE mg/dL   Protein, ur NEGATIVE NEGATIVE mg/dL   Nitrite NEGATIVE NEGATIVE   Leukocytes, UA LARGE (A) NEGATIVE   RBC / HPF 0-5 0 -  5 RBC/hpf   WBC, UA TOO NUMEROUS TO COUNT 0 - 5 WBC/hpf   Bacteria, UA RARE (A) NONE SEEN   Squamous Epithelial / LPF NONE SEEN NONE SEEN   Mucus PRESENT    Hyaline Casts, UA PRESENT   POC occult blood, ED Provider will collect     Status: Abnormal   Collection Time: 05/18/17 10:05 AM  Result Value Ref Range   Fecal Occult Bld POSITIVE (A) NEGATIVE  Type and screen [facility]     Status: None   Collection Time: 05/18/17 10:50 AM  Result Value Ref Range   ABO/RH(D) O POS    Antibody Screen NEG    Sample Expiration 05/21/2017    Unit Number [id number]    Blood Component Type RED CELLS,LR    Unit division 00    Status of Unit ISSUED,FINAL    Transfusion Status OK TO TRANSFUSE    Crossmatch Result Compatible    Unit Number [id number]    Blood Component Type RED CELLS,LR    Unit division 00    Status of Unit ISSUED,FINAL    Transfusion Status OK TO TRANSFUSE    Crossmatch Result Compatible   ABO/Rh     Status: None   Collection Time: 05/18/17 10:50 AM  Result Value Ref Range   ABO/RH(D) O POS   Prepare RBC     Status: None   Collection Time: 05/18/17 12:07 PM  Result Value Ref Range   Order Confirmation ORDER PROCESSED BY BLOOD BANK   CBG monitoring, ED     Status: None   Collection Time: 05/18/17 12:11 PM  Result Value Ref Range   Glucose-Capillary 99 65 - 99 mg/dL  Hemoglobin A1c     Status: Abnormal   Collection Time: 05/18/17  4:48 PM  Result Value Ref Range   Hgb A1c MFr Bld 6.5 (H) 4.8 - 5.6 %    Comment: (NOTE) Pre diabetes:          5.7%-6.4% Diabetes:              >6.4% Glycemic control for   <7.0% adults with diabetes    Mean Plasma Glucose 139.85 mg/dL  Glucose, capillary     Status: None   Collection Time: 05/18/17  4:54 PM  Result Value Ref Range   Glucose-Capillary 82 65 - 99 mg/dL  Glucose, capillary     Status: Abnormal   Collection Time: 05/18/17  9:54 PM  Result Value Ref Range   Glucose-Capillary 113 (H) 65 - 99 mg/dL   CBC     Status: Abnormal   Collection Time: 05/19/17  1:13 AM  Result Value Ref Range   WBC 5.2 4.0 - 10.5 K/uL   RBC 3.74 (L) 3.87 - 5.11 MIL/uL   Hemoglobin 11.2 (L) 12.0 - 15.0 g/dL    Comment: REPEATED TO VERIFY POST TRANSFUSION SPECIMEN    HCT 32.7 (L) 36.0 - 46.0 %   MCV 87.4 78.0 - 100.0 fL   MCH 29.9 26.0 - 34.0 pg   MCHC 34.3 30.0 - 36.0 g/dL   RDW 15.5 11.5 - 15.5 %   Platelets 264 150 - 400 K/uL  Glucose, capillary     Status: None   Collection Time: 05/19/17  8:02 AM  Result Value Ref Range   Glucose-Capillary 80 65 - 99 mg/dL  Comprehensive metabolic panel     Status: Abnormal   Collection Time: 05/19/17  8:59 AM  Result Value Ref Range   Sodium 140 135 - 145 mmol/L   Potassium 3.6 3.5 - 5.1 mmol/L   Chloride 111 101 - 111 mmol/L   CO2 21 (L) 22 - 32 mmol/L   Glucose, Bld 88 65 - 99 mg/dL   BUN 36 (H) 6 - 20 mg/dL   Creatinine, Ser 1.32 (H) 0.44 - 1.00 mg/dL   Calcium 8.8 (L) 8.9 - 10.3 mg/dL   Total Protein 6.5 6.5 - 8.1 g/dL   Albumin 2.7 (L) 3.5 - 5.0 g/dL   AST 35 15 - 41 U/L   ALT 26 14 - 54 U/L   Alkaline Phosphatase 133 (H) 38 - 126 U/L   Total Bilirubin 1.0 0.3 - 1.2 mg/dL   GFR calc non Af Amer 40 (L) >60 mL/min   GFR calc Af Amer 46 (L) >60 mL/min    Comment: (NOTE) The eGFR has been calculated using the CKD EPI equation. This calculation has not been validated in all clinical situations. eGFR's persistently <60 mL/min signify possible Chronic Kidney Disease.    Anion gap 8 5 - 15  CBC     Status: Abnormal   Collection Time: 05/19/17  8:59 AM  Result Value Ref Range   WBC 4.1 4.0 - 10.5 K/uL   RBC 3.53 (L) 3.87 - 5.11 MIL/uL   Hemoglobin 10.4 (L) 12.0 - 15.0 g/dL   HCT 31.1 (L) 36.0 - 46.0 %   MCV 88.1 78.0 - 100.0 fL   MCH 29.5 26.0 - 34.0 pg   MCHC 33.4 30.0 - 36.0 g/dL   RDW 15.9 (H) 11.5 - 15.5 %   Platelets 268 150 - 400 K/uL  TSH     Status: None   Collection Time: 05/19/17  8:59 AM  Result Value Ref Range   TSH 0.618 0.350 -  4.500 uIU/mL    Comment: Performed by a 3rd Generation assay with a functional sensitivity of <=0.01 uIU/mL.   Dg Chest 2 View  Result Date: 05/18/2017 CLINICAL DATA:  Weakness and chest pain EXAM: CHEST  2 VIEW COMPARISON:  04/08/2017 FINDINGS: Cardiac shadow is within normal limits. The right hemidiaphragm is again elevated and stable. No acute bony abnormality is seen. No focal  infiltrate is noted. IMPRESSION: No acute abnormality noted. Electronically Signed   By: Inez Catalina M.D.   On: 05/18/2017 07:41   Dg Sacrum/coccyx  Result Date: 05/18/2017 CLINICAL DATA:  Recent fall with buttock pain, initial encounter EXAM: SACRUM AND COCCYX - 2+ VIEW COMPARISON:  None. FINDINGS: There is no evidence of fracture or other focal bone lesions. IMPRESSION: No acute abnormality noted. Electronically Signed   By: Inez Catalina M.D.   On: 05/18/2017 09:23   Ct Head Wo Contrast  Result Date: 05/18/2017 CLINICAL DATA:  72 year old female with a history of fall EXAM: CT HEAD WITHOUT CONTRAST CT CERVICAL SPINE WITHOUT CONTRAST TECHNIQUE: Multidetector CT imaging of the head and cervical spine was performed following the standard protocol without intravenous contrast. Multiplanar CT image reconstructions of the cervical spine were also generated. COMPARISON:  Multiple prior, most recent 03/20/2017, 03/19/2017, 03/31/2015, 12/12/2009 FINDINGS: CT HEAD FINDINGS Brain: No acute intracranial hemorrhage. No midline shift or mass effect. Gray-white differentiation maintained. Unremarkable appearance of the ventricular system. Vascular: Calcifications of the anterior circulation and posterior circulation. Skull: No acute fracture.  No aggressive bone lesion identified. Sinuses/Orbits: Frontal sinuses are clear. Fluid level of the left maxillary sinus. Frothy secretions of the right maxillary sinus. Rounded intermediate density material within the anterior right ethmoid air cell, contiguous from the inferior recess of the right  frontal sinus. Other: None CT CERVICAL SPINE FINDINGS Alignment: Craniocervical junction aligned. Anatomic alignment of the cervical elements. No subluxation. Skull base and vertebrae: No acute fracture at the skullbase. Vertebral body heights relatively maintained. No acute fracture identified. Soft tissues and spinal canal: Lymph nodes present though not enlarged. Soft tissue nodule of the right thyroid measures 3.2 cm. No hematoma or soft tissue swelling. No canal hematoma. Disc levels: Mild disc space narrowing throughout the cervical spine with no bony canal narrowing or significant foraminal narrowing. Upper chest: Unremarkable appearance of the lung apices. Other: No bony canal narrowing. IMPRESSION: Head CT: No acute finding. Inspissated secretions or potentially a polyp of the anterior right ethmoid air cells. Air-fluid level of the left maxillary sinus with frothy secretions of the right sphenoid sinus, compatible with paranasal sinus disease. Cervical CT: No CT evidence of acute fracture malalignment of the cervical spine. Right thyroid nodule, incompletely characterized by CT. If there is concern for further evaluation, recommend referral for outpatient ultrasound. Electronically Signed   By: Corrie Mckusick D.O.   On: 05/18/2017 10:38   Ct Cervical Spine Wo Contrast  Result Date: 05/18/2017 CLINICAL DATA:  72 year old female with a history of fall EXAM: CT HEAD WITHOUT CONTRAST CT CERVICAL SPINE WITHOUT CONTRAST TECHNIQUE: Multidetector CT imaging of the head and cervical spine was performed following the standard protocol without intravenous contrast. Multiplanar CT image reconstructions of the cervical spine were also generated. COMPARISON:  Multiple prior, most recent 03/20/2017, 03/19/2017, 03/31/2015, 12/12/2009 FINDINGS: CT HEAD FINDINGS Brain: No acute intracranial hemorrhage. No midline shift or mass effect. Gray-white differentiation maintained. Unremarkable appearance of the ventricular  system. Vascular: Calcifications of the anterior circulation and posterior circulation. Skull: No acute fracture.  No aggressive bone lesion identified. Sinuses/Orbits: Frontal sinuses are clear. Fluid level of the left maxillary sinus. Frothy secretions of the right maxillary sinus. Rounded intermediate density material within the anterior right ethmoid air cell, contiguous from the inferior recess of the right frontal sinus. Other: None CT CERVICAL SPINE FINDINGS Alignment: Craniocervical junction aligned. Anatomic alignment of the cervical elements. No subluxation. Skull base and vertebrae: No acute fracture at  the skullbase. Vertebral body heights relatively maintained. No acute fracture identified. Soft tissues and spinal canal: Lymph nodes present though not enlarged. Soft tissue nodule of the right thyroid measures 3.2 cm. No hematoma or soft tissue swelling. No canal hematoma. Disc levels: Mild disc space narrowing throughout the cervical spine with no bony canal narrowing or significant foraminal narrowing. Upper chest: Unremarkable appearance of the lung apices. Other: No bony canal narrowing. IMPRESSION: Head CT: No acute finding. Inspissated secretions or potentially a polyp of the anterior right ethmoid air cells. Air-fluid level of the left maxillary sinus with frothy secretions of the right sphenoid sinus, compatible with paranasal sinus disease. Cervical CT: No CT evidence of acute fracture malalignment of the cervical spine. Right thyroid nodule, incompletely characterized by CT. If there is concern for further evaluation, recommend referral for outpatient ultrasound. Electronically Signed   By: Corrie Mckusick D.O.   On: 05/18/2017 10:38   Dg Abd 2 Views  Result Date: 05/18/2017 CLINICAL DATA:  Abdominal and epigastric pain. EXAM: ABDOMEN - 2 VIEW COMPARISON:  None. FINDINGS: The bowel gas pattern is normal. There is no evidence of free air. Right upper quadrant surgical clips from prior  cholecystectomy. Elevation of right hemidiaphragm also noted. Aortoiliac atherosclerotic calcification present. No radio-opaque calculi or other significant radiographic abnormality is seen. IMPRESSION: Unremarkable bowel gas pattern.  No acute findings. Electronically Signed   By: Earle Gell M.D.   On: 05/18/2017 13:52   Blood pressure (!) 171/72, pulse 78, temperature 98.1 F (36.7 C), temperature source Oral, resp. rate 13, height 5' 1"  (1.549 m), weight 69.6 kg (153 lb 6.4 oz), SpO2 99 %. Physical Exam  Constitutional: She is oriented to person, place, and time. She appears well-developed and well-nourished.  HENT:  Head: Normocephalic and atraumatic.  Eyes: Conjunctivae and EOM are normal. Pupils are equal, round, and reactive to light.  Neck: Neck supple.  Cardiovascular: Normal rate and regular rhythm.  Respiratory: Effort normal and breath sounds normal.  GI: Soft. Bowel sounds are normal.  Neurological: She is alert and oriented to person, place, and time.  Skin: Skin is warm and dry.  Psychiatric: She has a normal mood and affect. Her behavior is normal. Judgment and thought content normal.    Assessment/Plan Symptomatic anemia-proceed with an EGD at this time.   Tilford Deaton, MD 05/19/2017, 12:35 PM

## 2017-05-19 NOTE — Anesthesia Preprocedure Evaluation (Signed)
Anesthesia Evaluation  Patient identified by MRN, date of birth, ID band Patient awake    Reviewed: Allergy & Precautions, H&P , NPO status , Patient's Chart, lab work & pertinent test results  Airway Mallampati: II   Neck ROM: full    Dental   Pulmonary    breath sounds clear to auscultation       Cardiovascular hypertension,  Rhythm:regular Rate:Normal     Neuro/Psych PSYCHIATRIC DISORDERS Depression  Neuromuscular disease    GI/Hepatic hiatal hernia, GERD  ,  Endo/Other  diabetes, Type 2  Renal/GU Renal InsufficiencyRenal disease     Musculoskeletal  (+) Arthritis ,   Abdominal   Peds  Hematology   Anesthesia Other Findings   Reproductive/Obstetrics                             Anesthesia Physical Anesthesia Plan  ASA: II  Anesthesia Plan: MAC   Post-op Pain Management:    Induction: Intravenous  PONV Risk Score and Plan: 2 and Ondansetron and Propofol infusion  Airway Management Planned: Nasal Cannula  Additional Equipment:   Intra-op Plan:   Post-operative Plan:   Informed Consent: I have reviewed the patients History and Physical, chart, labs and discussed the procedure including the risks, benefits and alternatives for the proposed anesthesia with the patient or authorized representative who has indicated his/her understanding and acceptance.     Plan Discussed with: CRNA, Anesthesiologist and Surgeon  Anesthesia Plan Comments:         Anesthesia Quick Evaluation

## 2017-05-19 NOTE — Anesthesia Postprocedure Evaluation (Signed)
Anesthesia Post Note  Patient: Chloe Harding  Procedure(s) Performed: ESOPHAGOGASTRODUODENOSCOPY (EGD) (N/A )     Patient location during evaluation: PACU Anesthesia Type: MAC Level of consciousness: awake and alert Pain management: pain level controlled Vital Signs Assessment: post-procedure vital signs reviewed and stable Respiratory status: spontaneous breathing, nonlabored ventilation, respiratory function stable and patient connected to nasal cannula oxygen Cardiovascular status: stable and blood pressure returned to baseline Postop Assessment: no apparent nausea or vomiting Anesthetic complications: no    Last Vitals:  Vitals:   05/19/17 1448 05/19/17 1515  BP:  (!) 144/73  Pulse:  83  Resp:  15  Temp: 36.7 C   SpO2:  99%    Last Pain:  Vitals:   05/19/17 1448  TempSrc: Oral  PainSc:                  Leonard S

## 2017-05-19 NOTE — Op Note (Signed)
[facility] Patient Name: Chloe Harding Procedure Date : 05/19/2017 MRN: [id number] Attending MD: Juanita Craver , MD Date of Birth: 08/10/1945 CSN: [id number] Age: 72 Admit Type: Inpatient Procedure:                EGD with biopsies. Indications:              Iron deficiency anemia, Gastro-esophageal reflux                            disease. Providers:                Juanita Craver, MD, Zenon Mayo, RN, Cletis Athens,                            Technician, Trixie Deis, CRNA. Referring MD:             Hildred Laser, MD Medicines:                Monitored Anesthesia Care. Complications:            No immediate complications. Estimated Blood Loss:     Estimated blood loss: minimal. Procedure:                Pre-Anesthesia Assessment: - Prior to the                            procedure, a history and physical was performed,                            and patient medications and allergies were                            reviewed. The patient's tolerance of previous                            anesthesia was also reviewed. The risks and                            benefits of the procedure and the sedation options                            and risks were discussed with the patient. All                            questions were answered, and informed consent was                            obtained. Prior anticoagulants: The patient has                            taken previous NSAID medication, last dose was 3                            days prior to procedure. ASA Grade Assessment: III                            -  A patient with severe systemic disease. After                            reviewing the risks and benefits, the patient was                            deemed in satisfactory condition to undergo the                            procedure. After obtaining informed consent, the                            endoscope was passed under direct vision.                             Throughout the procedure, the patient's blood                            pressure, pulse, and oxygen saturations were                            monitored continuously. The [id number] ([id number])                            scope was introduced through the mouth, and                            advanced to the second part of duodenum. The EGD                            was accomplished without difficulty. The patient                            tolerated the procedure well. Scope In: Scope Out: Findings:      One large 3-4 cm cratered esophageal ulcer with no bleeding was found in       the distal esophagus 31 cm from the incisors-biopsies done; the rest of       the esophagus and GEJ appeared widely patent; a smaller ulcer was also       at GEJ probably from reflux.      A medium-sized hiatal hernia was noted on retroflexion; the rest of the       stomach appeared normal.      The examined duodenum was normal. Impression:               - One large 3-4 cm non-bleeding distal esophageal                            ulcer with a smaller ulcer st the GEJ-biopsies done.                           - Medium-sized hiatal hernia on retroflexion;                            otherwise  normal appearing stomach.                           - Normal examined duodenum. Moderate Sedation:      MAC given. Recommendation:           - Soft diet for 1 week.                           - Continue present medications.                           - No Aspirin, Ibuprofen, Naproxen, or other                            non-steroidal anti-inflammatory drugs.                           - Use Sucralfate suspension 1 gram PO QID for 2                            weeks.                           - Will prep patient for a colonoscopy tomorrow. Procedure Code(s):        --- Professional ---                           [id number], Esophagogastroduodenoscopy, flexible,                            transoral; with biopsy, single or  multiple Diagnosis Code(s):        --- Professional ---                           D50.9, Iron deficiency anemia, unspecified                           K21.9, Gastro-esophageal reflux disease without                            esophagitis                           K22.10, Ulcer of esophagus without bleeding                           K44.9, Diaphragmatic hernia without obstruction or                            gangrene CPT copyright 2016 [employer]. All rights reserved. The codes documented in this report are preliminary and upon coder review may  be revised to meet current compliance requirements. Juanita Craver, MD Juanita Craver, MD 05/19/2017 1:16:09 PM This report has been signed electronically. Number of Addenda: 0

## 2017-05-19 NOTE — Transfer of Care (Signed)
Immediate Anesthesia Transfer of Care Note  Patient: Chloe Harding  Procedure(s) Performed: ESOPHAGOGASTRODUODENOSCOPY (EGD) (N/A )  Patient Location: Endoscopy Unit  Anesthesia Type:MAC  Level of Consciousness: drowsy  Airway & Oxygen Therapy: Patient Spontanous Breathing and Patient connected to nasal cannula oxygen  Post-op Assessment: Report given to RN, Post -op Vital signs reviewed and stable and Patient moving all extremities  Post vital signs: Reviewed and stable  Last Vitals:  Vitals:   05/19/17 1146 05/19/17 1300  BP: (!) 171/72 (!) 134/51  Pulse: 78 88  Resp: 13 18  Temp: 36.7 C   SpO2: 99% 100%    Last Pain:  Vitals:   05/19/17 1300  TempSrc: Oral  PainSc:          Complications: No apparent anesthesia complications

## 2017-05-19 NOTE — Progress Notes (Signed)
PROGRESS NOTE  Chloe Harding NFA:[id number] DOB: 02-27-1946 DOA: 05/18/2017 PCP: Nolene Ebbs, MD   LOS: 0 days   Brief Narrative / Interim history: 72 y.o. female with medical history significant for GERD/hiatal hernia with prior esophageal dilatation in 2013, hypertension, dyslipidemia, diabetes on oral agents, and mild chronic kidney disease stage II-III, who was admitted to the hospital on 1/1 due to weakness, she was also found to have a decreased hemoglobin of 7.4, significantly lower than her prior readings, as well as an episode of hematemesis few days back, dark tarry stools with red material at times.  Gastroenterology was consulted  Assessment & Plan: Principal Problem:   Symptomatic anemia Active Problems:   Acute GI bleeding   Acute-on-chronic kidney injury ([place])   Hypertension   Hiatal hernia   History of esophageal stricture   Hypercholesteremia   Diabetes mellitus   Solitary thyroid nodule   Symptomatic anemia due to the acute GI bleeding -Patient was transfused 2 units of packed red blood cells, hemoglobin increased from 7.4. >> 11.2 this morning -Gastroenterology consulted, plans for an EGD and colonoscopy today -Continue to monitor CBC periodically  Acute kidney injury on chronic kidney disease stage III -Baseline creatinine around 1.1 -1.4 in 2018, 2.0 on presentation, improved with IV fluids and blood transfusions to 1.3 this morning -Continue to monitor BMP  Hypertension -Blood pressure medications are on hold due to hypotension when she came to the hospital in the setting of GI bleed  Hyperlipidemia -Hold statin, resume when she is able to tolerate a diet  Diabetes mellitus -Continue sliding scale hemoglobin A1c is controlled at 6.5  Solitary thyroid nodule on the right -Likely to be worked up as an outpatient, TSH is normal at 0.6   DVT prophylaxis: SCD Code Status: Full code Family Communication: No family at bedside Disposition Plan: Home  when ready  Consultants:   Gastroenterology  Procedures:   EGD/colonoscopy-pending  Antimicrobials:  None    Subjective: - no chest pain, shortness of breath, no abdominal pain, nausea or vomiting.  Feels a little bit better, awaiting procedures this morning  Objective: Vitals:   05/19/17 0020 05/19/17 0450 05/19/17 0817 05/19/17 1146  BP: 114/62 110/64 116/90 (!) 171/72  Pulse: 83 93 84 78  Resp: 18 16 15 13   Temp: 98 F (36.7 C) 97.8 F (36.6 C)  98.1 F (36.7 C)  TempSrc: Oral Oral  Oral  SpO2: 97% 100% 98% 99%  Weight:      Height:        Intake/Output Summary (Last 24 hours) at 05/19/2017 1159 Last data filed at 05/19/2017 0545 Gross per 24 hour  Intake 2417.33 ml  Output 950 ml  Net 1467.33 ml   Filed Weights   05/18/17 1601  Weight: 69.6 kg (153 lb 6.4 oz)    Examination:  Constitutional: NAD Eyes:  lids and conjunctivae normal ENMT: Mucous membranes are moist.  Respiratory: clear to auscultation bilaterally, no wheezing, no crackles.  Cardiovascular: Regular rate and rhythm, no murmurs / rubs / gallops. No LE edema. 2+ pedal pulses.  Abdomen: no tenderness. Bowel sounds positive.  Skin: no rashes Neurologic: CN 2-12 grossly intact. Strength 5/5 in all 4.    Data Reviewed: I have independently reviewed following labs and imaging studies   CBC: Recent Labs  Lab 05/18/17 0718 05/19/17 0113 05/19/17 0859  WBC 5.4 5.2 4.1  HGB 7.4* 11.2* 10.4*  HCT 22.3* 32.7* 31.1*  MCV 88.5 87.4 88.1  PLT 298 264 268  Basic Metabolic Panel: Recent Labs  Lab 05/18/17 0718 05/19/17 0859  NA 132* 140  K 4.5 3.6  CL 96* 111  CO2 21* 21*  GLUCOSE 136* 88  BUN 71* 36*  CREATININE 2.05* 1.32*  CALCIUM 10.0 8.8*   GFR: Estimated Creatinine Clearance: 35.4 mL/min (A) (by C-G formula based on SCr of 1.32 mg/dL (H)). Liver Function Tests: Recent Labs  Lab 05/19/17 0859  AST 35  ALT 26  ALKPHOS 133*  BILITOT 1.0  PROT 6.5  ALBUMIN 2.7*   No  results for input(s): LIPASE, AMYLASE in the last 168 hours. No results for input(s): AMMONIA in the last 168 hours. Coagulation Profile: No results for input(s): INR, PROTIME in the last 168 hours. Cardiac Enzymes: No results for input(s): CKTOTAL, CKMB, CKMBINDEX, TROPONINI in the last 168 hours. BNP (last 3 results) No results for input(s): PROBNP in the last 8760 hours. HbA1C: Recent Labs    05/18/17 1648  HGBA1C 6.5*   CBG: Recent Labs  Lab 05/18/17 1211 05/18/17 1654 05/18/17 2154 05/19/17 0802  GLUCAP 99 82 113* 80   Lipid Profile: No results for input(s): CHOL, HDL, LDLCALC, TRIG, CHOLHDL, LDLDIRECT in the last 72 hours. Thyroid Function Tests: Recent Labs    05/19/17 0859  TSH 0.618   Anemia Panel: No results for input(s): VITAMINB12, FOLATE, FERRITIN, TIBC, IRON, RETICCTPCT in the last 72 hours. Urine analysis:    Component Value Date/Time   COLORURINE YELLOW 05/18/2017 0842   APPEARANCEUR HAZY (A) 05/18/2017 0842   LABSPEC 1.006 05/18/2017 0842   PHURINE 5.0 05/18/2017 0842   GLUCOSEU NEGATIVE 05/18/2017 0842   HGBUR NEGATIVE 05/18/2017 0842   HGBUR trace-intact 05/14/2010 1402   BILIRUBINUR NEGATIVE 05/18/2017 0842   KETONESUR NEGATIVE 05/18/2017 0842   PROTEINUR NEGATIVE 05/18/2017 0842   UROBILINOGEN 1.0 03/30/2015 1540   NITRITE NEGATIVE 05/18/2017 0842   LEUKOCYTESUR LARGE (A) 05/18/2017 0842   Sepsis Labs: Invalid input(s): PROCALCITONIN, LACTICIDVEN  No results found for this or any previous visit (from the past 240 hour(s)).    Radiology Studies: Dg Chest 2 View  Result Date: 05/18/2017 CLINICAL DATA:  Weakness and chest pain EXAM: CHEST  2 VIEW COMPARISON:  04/08/2017 FINDINGS: Cardiac shadow is within normal limits. The right hemidiaphragm is again elevated and stable. No acute bony abnormality is seen. No focal infiltrate is noted. IMPRESSION: No acute abnormality noted. Electronically Signed   By: Inez Catalina M.D.   On: 05/18/2017  07:41   Dg Sacrum/coccyx  Result Date: 05/18/2017 CLINICAL DATA:  Recent fall with buttock pain, initial encounter EXAM: SACRUM AND COCCYX - 2+ VIEW COMPARISON:  None. FINDINGS: There is no evidence of fracture or other focal bone lesions. IMPRESSION: No acute abnormality noted. Electronically Signed   By: Inez Catalina M.D.   On: 05/18/2017 09:23   Ct Head Wo Contrast  Result Date: 05/18/2017 CLINICAL DATA:  72 year old female with a history of fall EXAM: CT HEAD WITHOUT CONTRAST CT CERVICAL SPINE WITHOUT CONTRAST TECHNIQUE: Multidetector CT imaging of the head and cervical spine was performed following the standard protocol without intravenous contrast. Multiplanar CT image reconstructions of the cervical spine were also generated. COMPARISON:  Multiple prior, most recent 03/20/2017, 03/19/2017, 03/31/2015, 12/12/2009 FINDINGS: CT HEAD FINDINGS Brain: No acute intracranial hemorrhage. No midline shift or mass effect. Gray-white differentiation maintained. Unremarkable appearance of the ventricular system. Vascular: Calcifications of the anterior circulation and posterior circulation. Skull: No acute fracture.  No aggressive bone lesion identified. Sinuses/Orbits: Frontal sinuses are clear.  Fluid level of the left maxillary sinus. Frothy secretions of the right maxillary sinus. Rounded intermediate density material within the anterior right ethmoid air cell, contiguous from the inferior recess of the right frontal sinus. Other: None CT CERVICAL SPINE FINDINGS Alignment: Craniocervical junction aligned. Anatomic alignment of the cervical elements. No subluxation. Skull base and vertebrae: No acute fracture at the skullbase. Vertebral body heights relatively maintained. No acute fracture identified. Soft tissues and spinal canal: Lymph nodes present though not enlarged. Soft tissue nodule of the right thyroid measures 3.2 cm. No hematoma or soft tissue swelling. No canal hematoma. Disc levels: Mild disc space  narrowing throughout the cervical spine with no bony canal narrowing or significant foraminal narrowing. Upper chest: Unremarkable appearance of the lung apices. Other: No bony canal narrowing. IMPRESSION: Head CT: No acute finding. Inspissated secretions or potentially a polyp of the anterior right ethmoid air cells. Air-fluid level of the left maxillary sinus with frothy secretions of the right sphenoid sinus, compatible with paranasal sinus disease. Cervical CT: No CT evidence of acute fracture malalignment of the cervical spine. Right thyroid nodule, incompletely characterized by CT. If there is concern for further evaluation, recommend referral for outpatient ultrasound. Electronically Signed   By: Corrie Mckusick D.O.   On: 05/18/2017 10:38   Ct Cervical Spine Wo Contrast  Result Date: 05/18/2017 CLINICAL DATA:  72 year old female with a history of fall EXAM: CT HEAD WITHOUT CONTRAST CT CERVICAL SPINE WITHOUT CONTRAST TECHNIQUE: Multidetector CT imaging of the head and cervical spine was performed following the standard protocol without intravenous contrast. Multiplanar CT image reconstructions of the cervical spine were also generated. COMPARISON:  Multiple prior, most recent 03/20/2017, 03/19/2017, 03/31/2015, 12/12/2009 FINDINGS: CT HEAD FINDINGS Brain: No acute intracranial hemorrhage. No midline shift or mass effect. Gray-white differentiation maintained. Unremarkable appearance of the ventricular system. Vascular: Calcifications of the anterior circulation and posterior circulation. Skull: No acute fracture.  No aggressive bone lesion identified. Sinuses/Orbits: Frontal sinuses are clear. Fluid level of the left maxillary sinus. Frothy secretions of the right maxillary sinus. Rounded intermediate density material within the anterior right ethmoid air cell, contiguous from the inferior recess of the right frontal sinus. Other: None CT CERVICAL SPINE FINDINGS Alignment: Craniocervical junction aligned.  Anatomic alignment of the cervical elements. No subluxation. Skull base and vertebrae: No acute fracture at the skullbase. Vertebral body heights relatively maintained. No acute fracture identified. Soft tissues and spinal canal: Lymph nodes present though not enlarged. Soft tissue nodule of the right thyroid measures 3.2 cm. No hematoma or soft tissue swelling. No canal hematoma. Disc levels: Mild disc space narrowing throughout the cervical spine with no bony canal narrowing or significant foraminal narrowing. Upper chest: Unremarkable appearance of the lung apices. Other: No bony canal narrowing. IMPRESSION: Head CT: No acute finding. Inspissated secretions or potentially a polyp of the anterior right ethmoid air cells. Air-fluid level of the left maxillary sinus with frothy secretions of the right sphenoid sinus, compatible with paranasal sinus disease. Cervical CT: No CT evidence of acute fracture malalignment of the cervical spine. Right thyroid nodule, incompletely characterized by CT. If there is concern for further evaluation, recommend referral for outpatient ultrasound. Electronically Signed   By: Corrie Mckusick D.O.   On: 05/18/2017 10:38   Dg Abd 2 Views  Result Date: 05/18/2017 CLINICAL DATA:  Abdominal and epigastric pain. EXAM: ABDOMEN - 2 VIEW COMPARISON:  None. FINDINGS: The bowel gas pattern is normal. There is no evidence of free air. Right upper  quadrant surgical clips from prior cholecystectomy. Elevation of right hemidiaphragm also noted. Aortoiliac atherosclerotic calcification present. No radio-opaque calculi or other significant radiographic abnormality is seen. IMPRESSION: Unremarkable bowel gas pattern.  No acute findings. Electronically Signed   By: Earle Gell M.D.   On: 05/18/2017 13:52     Scheduled Meds: . [MAR Hold] amitriptyline  25 mg Oral QHS  . [MAR Hold] gabapentin  600 mg Oral TID  . [MAR Hold] insulin aspart  0-5 Units Subcutaneous QHS  . [MAR Hold] insulin aspart   0-9 Units Subcutaneous TID WC  . [MAR Hold] pantoprazole (PROTONIX) IV  40 mg Intravenous Q12H  . [MAR Hold] rOPINIRole  2 mg Oral QHS  . [MAR Hold] sodium chloride flush  3 mL Intravenous Q12H   Continuous Infusions: . sodium chloride    . sodium chloride 100 mL/hr at 05/19/17 0625  . [MAR Hold] sodium chloride    . [MAR Hold] famotidine (PEPCID) IV Stopped (05/19/17 1109)    Marzetta Board, MD, PhD [facility] Pager [phone]  If 7PM-7AM, please contact night-coverage [email or web address] Password [email or web address] 05/19/2017, 11:59 AM

## 2017-05-19 NOTE — Anesthesia Procedure Notes (Signed)
Procedure Name: MAC Date/Time: 05/19/2017 12:36 PM Performed by: Leonor Liv, CRNA Oxygen Delivery Method: Nasal cannula Airway Equipment and Method: Bite block Placement Confirmation: positive ETCO2

## 2017-05-20 ENCOUNTER — Encounter (HOSPITAL_COMMUNITY)
Admission: EM | Disposition: A | Discharge status: Home / Self Care | Payer: Self-pay | Source: Home / Self Care | Attending: Emergency Medicine

## 2017-05-20 ENCOUNTER — Observation Stay (HOSPITAL_COMMUNITY): Payer: Medicare HMO | Admitting: Certified Registered Nurse Anesthetist

## 2017-05-20 ENCOUNTER — Encounter (HOSPITAL_COMMUNITY): Payer: Self-pay | Admitting: Certified Registered Nurse Anesthetist

## 2017-05-20 DIAGNOSIS — D649 Anemia, unspecified: Secondary | ICD-10-CM | POA: Diagnosis not present

## 2017-05-20 DIAGNOSIS — K5730 Diverticulosis of large intestine without perforation or abscess without bleeding: Secondary | ICD-10-CM | POA: Diagnosis not present

## 2017-05-20 DIAGNOSIS — R195 Other fecal abnormalities: Secondary | ICD-10-CM | POA: Diagnosis not present

## 2017-05-20 DIAGNOSIS — R0789 Other chest pain: Secondary | ICD-10-CM | POA: Diagnosis not present

## 2017-05-20 DIAGNOSIS — K5140 Inflammatory polyps of colon without complications: Secondary | ICD-10-CM | POA: Diagnosis not present

## 2017-05-20 HISTORY — PX: COLONOSCOPY WITH PROPOFOL: SHX5780

## 2017-05-20 LAB — BASIC METABOLIC PANEL
Anion gap: 10 (ref 5–15)
BUN: 15 mg/dL (ref 6–20)
CO2: 17 mmol/L — ABNORMAL LOW (ref 22–32)
CREATININE: 0.94 mg/dL (ref 0.44–1.00)
Calcium: 8.5 mg/dL — ABNORMAL LOW (ref 8.9–10.3)
Chloride: 113 mmol/L — ABNORMAL HIGH (ref 101–111)
GFR calc non Af Amer: >60 mL/min (ref >60)
Glucose, Bld: 77 mg/dL (ref 65–99)
Potassium: 3.9 mmol/L (ref 3.5–5.1)
SODIUM: 140 mmol/L (ref 135–145)

## 2017-05-20 LAB — CBC
HCT: 30.8 % — ABNORMAL LOW (ref 36.0–46.0)
Hemoglobin: 10.1 g/dL — ABNORMAL LOW (ref 12.0–15.0)
MCH: 29.4 pg (ref 26.0–34.0)
MCHC: 32.8 g/dL (ref 30.0–36.0)
MCV: 89.5 fL (ref 78.0–100.0)
PLATELETS: 293 10*3/uL (ref 150–400)
RBC: 3.44 MIL/uL — AB (ref 3.87–5.11)
RDW: 16.2 % — ABNORMAL HIGH (ref 11.5–15.5)
WBC: 4.7 10*3/uL (ref 4.0–10.5)

## 2017-05-20 LAB — GLUCOSE, CAPILLARY
GLUCOSE-CAPILLARY: 68 mg/dL (ref 65–99)
GLUCOSE-CAPILLARY: 112 mg/dL — AB (ref 65–99)
GLUCOSE-CAPILLARY: 175 mg/dL — AB (ref 65–99)
GLUCOSE-CAPILLARY: 130 mg/dL — AB (ref 65–99)
Glucose-Capillary: 87 mg/dL (ref 65–99)
Glucose-Capillary: 69 mg/dL (ref 65–99)

## 2017-05-20 SURGERY — COLONOSCOPY WITH PROPOFOL
Anesthesia: Monitor Anesthesia Care

## 2017-05-20 MED ORDER — SUCRALFATE 1 GM/10ML PO SUSP
1.0000 g | Freq: Three times a day (TID) | ORAL | Status: DC
  Administered 2017-05-20 – 2017-05-21 (×4): 1 g via ORAL
  Filled 2017-05-20 (×4): qty 10

## 2017-05-20 MED ORDER — DEXTROSE 250 MG/ML IV SOLN
12.5000 g | Freq: Once | INTRAVENOUS | Status: AC | PRN
  Administered 2017-05-20: 12.5 g via INTRAVENOUS
  Filled 2017-05-20: qty 50

## 2017-05-20 MED ORDER — PROPOFOL 10 MG/ML IV BOLUS
INTRAVENOUS | Status: DC | PRN
  Administered 2017-05-20: 50 mg via INTRAVENOUS
  Administered 2017-05-20: 25 mg via INTRAVENOUS
  Administered 2017-05-20: 50 mg via INTRAVENOUS
  Administered 2017-05-20: 75 mg via INTRAVENOUS
  Administered 2017-05-20: 50 mg via INTRAVENOUS

## 2017-05-20 MED ORDER — DEXTROSE 50 % IV SOLN
INTRAVENOUS | Status: AC
  Administered 2017-05-20: 25 mL
  Filled 2017-05-20: qty 50

## 2017-05-20 MED ORDER — LACTATED RINGERS IV SOLN
INTRAVENOUS | Status: DC
  Administered 2017-05-20: 1000 mL via INTRAVENOUS
  Administered 2017-05-20: 13:00:00 via INTRAVENOUS

## 2017-05-20 SURGICAL SUPPLY — 22 items

## 2017-05-20 NOTE — Transfer of Care (Signed)
Immediate Anesthesia Transfer of Care Note  Patient: Chloe Harding  Procedure(s) Performed: COLONOSCOPY WITH PROPOFOL (N/A )  Patient Location: PACU and Endoscopy Unit  Anesthesia Type:MAC  Level of Consciousness: awake, drowsy and patient cooperative  Airway & Oxygen Therapy: Patient Spontanous Breathing and Patient connected to face mask oxygen  Post-op Assessment: Report given to RN, Post -op Vital signs reviewed and stable and Patient moving all extremities  Post vital signs: Reviewed and stable  Last Vitals:  Vitals:   05/20/17 0954 05/20/17 1238  BP:  (!) 174/72  Pulse:  93  Resp:  (!) 23  Temp: 37.1 C 37.1 C  SpO2:  98%    Last Pain:  Vitals:   05/20/17 1238  TempSrc: Oral  PainSc:          Complications: No apparent anesthesia complications

## 2017-05-20 NOTE — Op Note (Signed)
[facility] Patient Name: Chloe Harding Procedure Date : 05/20/2017 MRN: [id number] Attending MD: Carol Ada , MD Date of Birth: May 24, 1945 CSN: [id number] Age: 72 Admit Type: Inpatient Procedure:                Colonoscopy Indications:              Heme positive stool, Iron deficiency anemia Providers:                Carol Ada, MD, Cleda Daub, RN, Cherylynn Ridges,                            Technician, Cira Servant, CRNA Referring MD:              Medicines:                Propofol per Anesthesia Complications:            No immediate complications. Estimated Blood Loss:     Estimated blood loss was minimal. Procedure:                Pre-Anesthesia Assessment:                           - Prior to the procedure, a History and Physical                            was performed, and patient medications and                            allergies were reviewed. The patient's tolerance of                            previous anesthesia was also reviewed. The risks                            and benefits of the procedure and the sedation                            options and risks were discussed with the patient.                            All questions were answered, and informed consent                            was obtained. Prior Anticoagulants: The patient has                            taken no previous anticoagulant or antiplatelet                            agents. ASA Grade Assessment: III - A patient with                            severe systemic disease. After reviewing the risks  and benefits, the patient was deemed in                            satisfactory condition to undergo the procedure.                           - Sedation was administered by an anesthesia                            professional. Deep sedation was attained.                           After obtaining informed consent, the colonoscope                            was  passed under direct vision. Throughout the                            procedure, the patient's blood pressure, pulse, and                            oxygen saturations were monitored continuously. The                            EC-[id number] ([id number]) scope was introduced through                            the anus and advanced to the the cecum, identified                            by appendiceal orifice and ileocecal valve. The                            colonoscopy was performed without difficulty. The                            patient tolerated the procedure well. The quality                            of the bowel preparation was good. The ileocecal                            valve, appendiceal orifice, and rectum were                            photographed. Scope In: 1:13:37 PM Scope Out: 1:31:46 PM Scope Withdrawal Time: 0 hours 11 minutes 53 seconds  Total Procedure Duration: 0 hours 18 minutes 9 seconds  Findings:      A 3 mm polyp was found in the recto-sigmoid colon. The polyp was       sessile. The polyp was removed with a cold snare. Resection and       retrieval were complete.      Scattered small and large-mouthed diverticula were found in the sigmoid       colon.  Impression:               - One 3 mm polyp at the recto-sigmoid colon,                            removed with a cold snare. Resected and retrieved.                           - Diverticulosis in the sigmoid colon. Moderate Sedation:      None Recommendation:           - Return patient to hospital ward for ongoing care.                           - Resume regular diet.                           - Continue present medications.                           - Await pathology results.                           - Repeat colonoscopy in 5-10 years for surveillance                            based on pathology results. Procedure Code(s):        --- Professional ---                           [id number], Colonoscopy, flexible;  with removal of                            tumor(s), polyp(s), or other lesion(s) by snare                            technique Diagnosis Code(s):        --- Professional ---                           D12.7, Benign neoplasm of rectosigmoid junction                           R19.5, Other fecal abnormalities                           D50.9, Iron deficiency anemia, unspecified                           K57.30, Diverticulosis of large intestine without                            perforation or abscess without bleeding CPT copyright 2016 [employer]. All rights reserved. The codes documented in this report are preliminary and upon coder review may  be revised to meet current compliance requirements. Carol Ada, MD Carol Ada, MD 05/20/2017 1:52:55 PM This report has been signed electronically. Number of Addenda: 0

## 2017-05-20 NOTE — Progress Notes (Signed)
Patient was able to complete 2/3 of golitely jug for colonoscopy tomorrow.  Stool is liquid and light in color.

## 2017-05-20 NOTE — Anesthesia Procedure Notes (Signed)
Procedure Name: MAC Date/Time: 05/20/2017 1:14 PM Performed by: Lowella Dell, CRNA Pre-anesthesia Checklist: Patient identified, Emergency Drugs available, Suction available, Patient being monitored and Timeout performed Patient Re-evaluated:Patient Re-evaluated prior to induction Oxygen Delivery Method: Simple face mask Induction Type: IV induction Placement Confirmation: positive ETCO2 Dental Injury: Teeth and Oropharynx as per pre-operative assessment

## 2017-05-20 NOTE — Progress Notes (Signed)
Small amount of bleeding from rectum noticed after patient toileted. Dr. Benson Norway made aware and this is expected finding after removing polyp.

## 2017-05-20 NOTE — Anesthesia Preprocedure Evaluation (Addendum)
Anesthesia Evaluation  Patient identified by MRN, date of birth, ID band Patient awake    Reviewed: Allergy & Precautions, H&P , NPO status , Patient's Chart, lab work & pertinent test results  Airway Mallampati: II  TM Distance: >3 FB Neck ROM: full    Dental no notable dental hx.    Pulmonary    Pulmonary exam normal breath sounds clear to auscultation       Cardiovascular Exercise Tolerance: Good hypertension,  Rhythm:regular Rate:Normal     Neuro/Psych PSYCHIATRIC DISORDERS Depression  Neuromuscular disease    GI/Hepatic hiatal hernia, GERD  ,  Endo/Other  diabetes, Type 2  Renal/GU Renal InsufficiencyRenal disease     Musculoskeletal  (+) Arthritis ,   Abdominal   Peds  Hematology   Anesthesia Other Findings   Reproductive/Obstetrics                             Anesthesia Physical  Anesthesia Plan  ASA: II  Anesthesia Plan: MAC   Post-op Pain Management:    Induction: Intravenous  PONV Risk Score and Plan: 2 and Ondansetron, Propofol infusion and Treatment may vary due to age or medical condition  Airway Management Planned: Nasal Cannula  Additional Equipment:   Intra-op Plan:   Post-operative Plan:   Informed Consent: I have reviewed the patients History and Physical, chart, labs and discussed the procedure including the risks, benefits and alternatives for the proposed anesthesia with the patient or authorized representative who has indicated his/her understanding and acceptance.     Plan Discussed with: CRNA, Anesthesiologist and Surgeon  Anesthesia Plan Comments:         Anesthesia Quick Evaluation

## 2017-05-20 NOTE — Care Management Obs Status (Signed)
[facility] NOTIFICATION   Patient Details  Name: ADRINNE SZE MRN: [id number] Date of Birth: 09-Oct-1945   Medicare Observation Status Notification Given:       Laurena Slimmer, RN 05/20/2017, 7:26 PM

## 2017-05-20 NOTE — Progress Notes (Signed)
Hypoglycemic Event  CBG: 68  Treatment: dextrose 40mL  Symptoms: none  Follow-up CBG: Time: 0949 CBG Result: 87  Possible Reasons for Event: Patient is NPO for colonoscopy  Comments/MD notified: Dr. Tyrell Antonio aware. Will monitor and treat accordingly     Lynann Beaver

## 2017-05-20 NOTE — Progress Notes (Signed)
PROGRESS NOTE  Chloe Harding ZHG:[id number] DOB: [birth year] DOA: 05/18/2017 PCP: Nolene Ebbs, MD   LOS: 0 days   Brief Narrative / Interim history: 72 y.o. female with medical history significant for GERD/hiatal hernia with prior esophageal dilatation in 2013, hypertension, dyslipidemia, diabetes on oral agents, and mild chronic kidney disease stage II-III, who was admitted to the hospital on 1/1 due to weakness, she was also found to have a decreased hemoglobin of 7.4, significantly lower than her prior readings, as well as an episode of hematemesis few days back, dark tarry stools with red material at times.  Gastroenterology was consulted  Assessment & Plan: Principal Problem:   Symptomatic anemia Active Problems:   Acute GI bleeding   Acute-on-chronic kidney injury ([place])   Hypertension   Hiatal hernia   History of esophageal stricture   Hypercholesteremia   Diabetes mellitus   Solitary thyroid nodule   Symptomatic anemia due to the acute GI bleeding -Patient was transfused 2 units of packed red blood cells, hemoglobin increased from 7.4. >> 11.2 this morning -endoscopy showed esophageal ulcer. Started on carafate  -colonoscopy with small polips, which was removed.   Acute kidney injury on chronic kidney disease stage III -Baseline creatinine around 1.1 -1.4 in 2018, 2.0 on presentation, improved with IV fluids -Continue to monitor BMP  Hypoglycemia;  Yesterday and today.  Resume diet. Monitor overnight.   Metabolic acidosis; bicarb decrease to 17. Increase IV fluids for few hours.   Hypertension -Blood pressure medications are on hold due to hypotension when she came to the hospital in the setting of GI bleed  Hyperlipidemia -Hold statin, resume when she is able to tolerate a diet  Diabetes mellitus -Continue sliding scale hemoglobin A1c is controlled at 6.5  Solitary thyroid nodule on the right -Likely to be worked up as an outpatient, TSH is normal at  0.6   DVT prophylaxis: SCD Code Status: Full code Family Communication: No family at bedside Disposition Plan: Home when ready  Consultants:   Gastroenterology  Procedures:   EGD/colonoscopy-pending  Antimicrobials:  None    Subjective: She has not been eating. Denies abdominal pain   Objective: Vitals:   05/20/17 1238 05/20/17 1341 05/20/17 1346 05/20/17 1352  BP: (!) 174/72 (!) 88/61 102/71 (!) 148/74  Pulse: 93 97 96 89  Resp: (!) 23 19 19 15   Temp: 98.7 F (37.1 C) 98.3 F (36.8 C)    TempSrc: Oral Oral    SpO2: 98% 100% 100% 100%  Weight:      Height:        Intake/Output Summary (Last 24 hours) at 05/20/2017 1409 Last data filed at 05/20/2017 0944 Gross per 24 hour  Intake 2410 ml  Output 1 ml  Net 2409 ml   Filed Weights   05/18/17 1601  Weight: 69.6 kg (153 lb 6.4 oz)    Examination:  Constitutional: NAD Respiratory: CTA Cardiovascular s1, S 2 RRR Abdomen: BS present, soft, nt  Skin: no rashes Neurologic: non focal.    Data Reviewed: I have independently reviewed following labs and imaging studies   CBC: Recent Labs  Lab 05/18/17 0718 05/19/17 0113 05/19/17 0859 05/20/17 0458  WBC 5.4 5.2 4.1 4.7  HGB 7.4* 11.2* 10.4* 10.1*  HCT 22.3* 32.7* 31.1* 30.8*  MCV 88.5 87.4 88.1 89.5  PLT 298 264 268 196   Basic Metabolic Panel: Recent Labs  Lab 05/18/17 0718 05/19/17 0859 05/20/17 0458  NA 132* 140 140  K 4.5 3.6 3.9  CL  96* 111 113*  CO2 21* 21* 17*  GLUCOSE 136* 88 77  BUN 71* 36* 15  CREATININE 2.05* 1.32* 0.94  CALCIUM 10.0 8.8* 8.5*   GFR: Estimated Creatinine Clearance: 49.7 mL/min (by C-G formula based on SCr of 0.94 mg/dL). Liver Function Tests: Recent Labs  Lab 05/19/17 0859  AST 35  ALT 26  ALKPHOS 133*  BILITOT 1.0  PROT 6.5  ALBUMIN 2.7*   No results for input(s): LIPASE, AMYLASE in the last 168 hours. No results for input(s): AMMONIA in the last 168 hours. Coagulation Profile: No results for input(s):  INR, PROTIME in the last 168 hours. Cardiac Enzymes: No results for input(s): CKTOTAL, CKMB, CKMBINDEX, TROPONINI in the last 168 hours. BNP (last 3 results) No results for input(s): PROBNP in the last 8760 hours. HbA1C: Recent Labs    05/18/17 1648  HGBA1C 6.5*   CBG: Recent Labs  Lab 05/19/17 2121 05/20/17 0809 05/20/17 0949 05/20/17 1149 05/20/17 1247  GLUCAP 102* 68 87 69 112*   Lipid Profile: No results for input(s): CHOL, HDL, LDLCALC, TRIG, CHOLHDL, LDLDIRECT in the last 72 hours. Thyroid Function Tests: Recent Labs    05/19/17 0859  TSH 0.618   Anemia Panel: No results for input(s): VITAMINB12, FOLATE, FERRITIN, TIBC, IRON, RETICCTPCT in the last 72 hours. Urine analysis:    Component Value Date/Time   COLORURINE YELLOW 05/18/2017 0842   APPEARANCEUR HAZY (A) 05/18/2017 0842   LABSPEC 1.006 05/18/2017 0842   PHURINE 5.0 05/18/2017 0842   GLUCOSEU NEGATIVE 05/18/2017 0842   HGBUR NEGATIVE 05/18/2017 0842   HGBUR trace-intact 05/14/2010 1402   BILIRUBINUR NEGATIVE 05/18/2017 0842   KETONESUR NEGATIVE 05/18/2017 0842   PROTEINUR NEGATIVE 05/18/2017 0842   UROBILINOGEN 1.0 03/30/2015 1540   NITRITE NEGATIVE 05/18/2017 0842   LEUKOCYTESUR LARGE (A) 05/18/2017 0842   Sepsis Labs: Invalid input(s): PROCALCITONIN, LACTICIDVEN  No results found for this or any previous visit (from the past 240 hour(s)).    Radiology Studies: No results found.   Scheduled Meds: . [MAR Hold] amitriptyline  25 mg Oral QHS  . [MAR Hold] gabapentin  600 mg Oral TID  . [MAR Hold] insulin aspart  0-5 Units Subcutaneous QHS  . [MAR Hold] insulin aspart  0-9 Units Subcutaneous TID WC  . [MAR Hold] pantoprazole (PROTONIX) IV  40 mg Intravenous Q12H  . [MAR Hold] rOPINIRole  2 mg Oral QHS  . [MAR Hold] sodium chloride flush  3 mL Intravenous Q12H  . [MAR Hold] sucralfate  1 g Oral TID WC & HS   Continuous Infusions: . sodium chloride 125 mL/hr at 05/20/17 1028  . [MAR Hold]  famotidine (PEPCID) IV Stopped (05/20/17 1026)  . lactated ringers 1,000 mL (05/20/17 1302)    Niel Hummer, MD [facility] Pager [phone]  If 7PM-7AM, please contact night-coverage [email or web address] Password [email or web address] 05/20/2017, 2:09 PM

## 2017-05-20 NOTE — Interval H&P Note (Signed)
History and Physical Interval Note:  05/20/2017 1:01 PM  Chloe Harding  has presented today for surgery, with the diagnosis of anemia  The various methods of treatment have been discussed with the patient and family. After consideration of risks, benefits and other options for treatment, the patient has consented to  Procedure(s): COLONOSCOPY WITH PROPOFOL (N/A) as a surgical intervention .  The patient's history has been reviewed, patient examined, no change in status, stable for surgery.  I have reviewed the patient's chart and labs.  Questions were answered to the patient's satisfaction.     [name]

## 2017-05-20 NOTE — Anesthesia Postprocedure Evaluation (Signed)
Anesthesia Post Note  Patient: Chloe Harding  Procedure(s) Performed: COLONOSCOPY WITH PROPOFOL (N/A )     Patient location during evaluation: PACU Anesthesia Type: MAC Level of consciousness: awake and alert Pain management: pain level controlled Vital Signs Assessment: post-procedure vital signs reviewed and stable Respiratory status: spontaneous breathing, nonlabored ventilation, respiratory function stable and patient connected to nasal cannula oxygen Cardiovascular status: stable and blood pressure returned to baseline Postop Assessment: no apparent nausea or vomiting Anesthetic complications: no    Last Vitals:  Vitals:   05/20/17 1346 05/20/17 1352  BP: 102/71 (!) 148/74  Pulse: 96 89  Resp: 19 15  Temp:    SpO2: 100% 100%    Last Pain:  Vitals:   05/20/17 1341  TempSrc: Oral  PainSc:                  Riccardo Dubin

## 2017-05-20 NOTE — Care Management Obs Status (Signed)
[place] NOTIFICATION   Patient Details  Name: Chloe Harding MRN: [id number] Date of Birth: [birth year]   Medicare Observation Status Notification Given:       Laurena Slimmer, RN 05/20/2017, 7:25 PM

## 2017-05-21 DIAGNOSIS — D649 Anemia, unspecified: Secondary | ICD-10-CM | POA: Diagnosis not present

## 2017-05-21 LAB — BASIC METABOLIC PANEL
Anion gap: 5 (ref 5–15)
CHLORIDE: 113 mmol/L — AB (ref 101–111)
CO2: 22 mmol/L (ref 22–32)
CREATININE: 0.78 mg/dL (ref 0.44–1.00)
Calcium: 8.3 mg/dL — ABNORMAL LOW (ref 8.9–10.3)
GFR calc Af Amer: >60 mL/min (ref >60)
GFR calc non Af Amer: >60 mL/min (ref >60)
GLUCOSE: 106 mg/dL — AB (ref 65–99)
POTASSIUM: 3.4 mmol/L — AB (ref 3.5–5.1)
SODIUM: 140 mmol/L (ref 135–145)

## 2017-05-21 LAB — HEMOGLOBIN AND HEMATOCRIT, BLOOD
HCT: 31.1 % — ABNORMAL LOW (ref 36.0–46.0)
Hemoglobin: 10.2 g/dL — ABNORMAL LOW (ref 12.0–15.0)

## 2017-05-21 LAB — GLUCOSE, CAPILLARY: Glucose-Capillary: 90 mg/dL (ref 65–99)

## 2017-05-21 MED ORDER — PANTOPRAZOLE SODIUM 40 MG PO TBEC: 40.0000 mg | DELAYED_RELEASE_TABLET | Freq: Two times a day (BID) | ORAL | 1 refills | Status: DC

## 2017-05-21 MED ORDER — GABAPENTIN 600 MG PO TABS: 600.0000 mg | ORAL_TABLET | Freq: Two times a day (BID) | ORAL | 0 refills | Status: DC

## 2017-05-21 MED ORDER — POTASSIUM CHLORIDE CRYS ER 20 MEQ PO TBCR
40.0000 meq | EXTENDED_RELEASE_TABLET | Freq: Once | ORAL | Status: AC
  Administered 2017-05-21: 40 meq via ORAL
  Filled 2017-05-21: qty 2

## 2017-05-21 MED ORDER — LISINOPRIL 10 MG PO TABS
10.0000 mg | ORAL_TABLET | Freq: Every day | ORAL | Status: DC
  Administered 2017-05-21: 10 mg via ORAL
  Filled 2017-05-21: qty 1

## 2017-05-21 NOTE — Progress Notes (Signed)
Subjective: Mild bleeding from the rectosigmoid polyp resection.  Resolved.  Feeling well.  Objective: Vital signs in last 24 hours: Temp:  [98.3 F (36.8 C)-98.8 F (37.1 C)] 98.8 F (37.1 C) (01/04 0545) Pulse Rate:  [82-97] 89 (01/04 0545) Resp:  [15-23] 16 (01/04 0545) BP: (88-174)/(61-83) 151/79 (01/04 0545) SpO2:  [98 %-100 %] 98 % (01/04 0545) Last BM Date: 05/20/17  Intake/Output from previous day: 01/03 0701 - 01/04 0700 In: 1098.8 [I.V.:998.8; IV Piggyback:100] Out: 951 [Urine:950; Stool:1] Intake/Output this shift: Total I/O In: 170 [P.O.:120; IV Piggyback:50] Out: 250 [Urine:250]  General appearance: alert and no distress GI: soft, non-tender; bowel sounds normal; no masses,  no organomegaly  Lab Results: Recent Labs    05/19/17 0113 05/19/17 0859 05/20/17 0458  WBC 5.2 4.1 4.7  HGB 11.2* 10.4* 10.1*  HCT 32.7* 31.1* 30.8*  PLT 264 268 293   BMET Recent Labs    05/19/17 0859 05/20/17 0458 05/21/17 0327  NA 140 140 140  K 3.6 3.9 3.4*  CL 111 113* 113*  CO2 21* 17* 22  GLUCOSE 88 77 106*  BUN 36* 15 <5*  CREATININE 1.32* 0.94 0.78  CALCIUM 8.8* 8.5* 8.3*   LFT Recent Labs    05/19/17 0859  PROT 6.5  ALBUMIN 2.7*  AST 35  ALT 26  ALKPHOS 133*  BILITOT 1.0   PT/INR No results for input(s): LABPROT, INR in the last 72 hours. Hepatitis Panel No results for input(s): HEPBSAG, HCVAB, HEPAIGM, HEPBIGM in the last 72 hours. C-Diff No results for input(s): CDIFFTOX in the last 72 hours. Fecal Lactopherrin No results for input(s): FECLLACTOFRN in the last 72 hours.  Studies/Results: No results found.  Medications:  Scheduled: . amitriptyline  25 mg Oral QHS  . gabapentin  600 mg Oral TID  . insulin aspart  0-5 Units Subcutaneous QHS  . insulin aspart  0-9 Units Subcutaneous TID WC  . pantoprazole (PROTONIX) IV  40 mg Intravenous Q12H  . rOPINIRole  2 mg Oral QHS  . sodium chloride flush  3 mL Intravenous Q12H  . sucralfate  1 g  Oral TID WC & HS   Continuous: . sodium chloride 100 mL/hr at 05/20/17 1429  . famotidine (PEPCID) IV Stopped (05/21/17 2297)    Assessment/Plan: 1) Esophageal ulcer - benign.  Remain on PPI.  2) Anemia.   She is stable.  No further GI work up is needed at this time.  Plan: 1) Follow up in the office in 2-4 weeks. 2) D/C with omeprazole 40 mg QD 30 minutes before breakfast.    LOS: 0 days   Amr Sturtevant D 05/21/2017, 9:55 AM

## 2017-05-21 NOTE — Discharge Summary (Signed)
Physician Discharge Summary  Chloe Harding QAS:[id number] DOB: January 11, 1946 DOA: 05/18/2017  PCP: Nolene Ebbs, MD  Admit date: 05/18/2017 Discharge date: 05/21/2017  Admitted From: Home  Disposition: Home   Recommendations for Outpatient Follow-up:  1. Follow up with PCP in 1-2 weeks 2. Please obtain BMP/CBC in one week 3. Needs repeat hb. Resume aspirin when ok by GI 4. Needs follow up of solitary thyroid nodule.     Home Health: none  Discharge Condition: stable.  CODE STATUS: full code.  Diet recommendation: Heart Healthy  Brief/Interim Summary: Brief Narrative / Interim history: 72 y.o.femalewith medical history significant forGERD/hiatal hernia with prior esophageal dilatation in 2013, hypertension, dyslipidemia, diabetes on oral agents, and mild chronic kidney disease stage II-III, who was admitted to the hospital on 1/1 due to weakness, she was also found to have a decreased hemoglobin of 7.4, significantly lower than her prior readings, as well as an episode of hematemesis few days back, dark tarry stools with red material at times.  Gastroenterology was consulted  Assessment & Plan: Principal Problem:   Symptomatic anemia Active Problems:   Acute GI bleeding   Acute-on-chronic kidney injury ([place])   Hypertension   Hiatal hernia   History of esophageal stricture   Hypercholesteremia   Diabetes mellitus   Solitary thyroid nodule   Symptomatic anemia due to the acute GI bleeding -Patient was transfused 2 units of packed red blood cells, hemoglobin increased from 7.4. >> 11.2 this morning -Endoscopy showed esophageal ulcer. Started on carafate . Dr hung recommends PPI at discharge.  -Colonoscopy with small polips, which was removed.  -No further bleeding post polypectomy. Hb stable.  -Plan to discharge patient today.   Acute kidney injury on chronic kidney disease stage III -Baseline creatinine around 1.1 -1.4 in 2018, 2.0 on presentation, improved with IV  fluids -Continue to monitor BMP  Hypoglycemia;  Patient develops hypoglycemia during hospitalization, probably related to NPO status for procedure.  CBG improved. Will hold glipized at discharge. Resume on follow up with PCP if cbg increases.    Metabolic acidosis; bicarb decrease to 17. Received IV fluids,. Resolved.   Hypertension -Blood pressure medications are on hold due to hypotension when she came to the hospital in the setting of GI bleed resume lisinopril./   Hyperlipidemia -resume statins.   Diabetes mellitus -Continue sliding scale hemoglobin A1c is controlled at 6.5  Solitary thyroid nodule on the right -Likely to be worked up as an outpatient, TSH is normal at 0.6     Discharge Diagnoses:  Principal Problem:   Symptomatic anemia Active Problems:   Acute GI bleeding   Acute-on-chronic kidney injury ([place])   Hypertension   Hiatal hernia   History of esophageal stricture   Hypercholesteremia   Diabetes mellitus   Solitary thyroid nodule    Discharge Instructions  Discharge Instructions    Diet - low sodium heart healthy   Complete by:  As directed    Increase activity slowly   Complete by:  As directed      Allergies as of 05/21/2017      Reactions   Amlodipine Besylate Swelling   swelling      Medication List    STOP taking these medications   aspirin 81 MG tablet   glipiZIDE 5 MG tablet Commonly known as:  GLUCOTROL   meloxicam 15 MG tablet Commonly known as:  MOBIC   saccharomyces boulardii 250 MG capsule Commonly known as:  FLORASTOR     TAKE these medications  amitriptyline 25 MG tablet Commonly known as:  ELAVIL Take 25 mg by mouth at bedtime.   gabapentin 600 MG tablet Commonly known as:  NEURONTIN Take 1 tablet (600 mg total) by mouth 2 (two) times daily. What changed:  when to take this   lisinopril 10 MG tablet Commonly known as:  PRINIVIL,ZESTRIL Take 10 mg by mouth daily.   pantoprazole 40 MG  tablet Commonly known as:  PROTONIX Take 1 tablet (40 mg total) by mouth 2 (two) times daily.   pravastatin 20 MG tablet Commonly known as:  PRAVACHOL Take 20 mg by mouth at bedtime.   rOPINIRole 2 MG tablet Commonly known as:  REQUIP Take 2 mg by mouth at bedtime.   traMADol 50 MG tablet Commonly known as:  ULTRAM Take 50 mg by mouth 3 (three) times daily.       Allergies  Allergen Reactions  . Amlodipine Besylate Swelling    swelling    Consultations:  GI   Procedures/Studies: Dg Chest 2 View  Result Date: 05/18/2017 CLINICAL DATA:  Weakness and chest pain EXAM: CHEST  2 VIEW COMPARISON:  04/08/2017 FINDINGS: Cardiac shadow is within normal limits. The right hemidiaphragm is again elevated and stable. No acute bony abnormality is seen. No focal infiltrate is noted. IMPRESSION: No acute abnormality noted. Electronically Signed   By: Inez Catalina M.D.   On: 05/18/2017 07:41   Dg Sacrum/coccyx  Result Date: 05/18/2017 CLINICAL DATA:  Recent fall with buttock pain, initial encounter EXAM: SACRUM AND COCCYX - 2+ VIEW COMPARISON:  None. FINDINGS: There is no evidence of fracture or other focal bone lesions. IMPRESSION: No acute abnormality noted. Electronically Signed   By: Inez Catalina M.D.   On: 05/18/2017 09:23   Ct Head Wo Contrast  Result Date: 05/18/2017 CLINICAL DATA:  72 year old female with a history of fall EXAM: CT HEAD WITHOUT CONTRAST CT CERVICAL SPINE WITHOUT CONTRAST TECHNIQUE: Multidetector CT imaging of the head and cervical spine was performed following the standard protocol without intravenous contrast. Multiplanar CT image reconstructions of the cervical spine were also generated. COMPARISON:  Multiple prior, most recent 03/20/2017, 03/19/2017, 03/31/2015, 12/12/2009 FINDINGS: CT HEAD FINDINGS Brain: No acute intracranial hemorrhage. No midline shift or mass effect. Gray-white differentiation maintained. Unremarkable appearance of the ventricular system.  Vascular: Calcifications of the anterior circulation and posterior circulation. Skull: No acute fracture.  No aggressive bone lesion identified. Sinuses/Orbits: Frontal sinuses are clear. Fluid level of the left maxillary sinus. Frothy secretions of the right maxillary sinus. Rounded intermediate density material within the anterior right ethmoid air cell, contiguous from the inferior recess of the right frontal sinus. Other: None CT CERVICAL SPINE FINDINGS Alignment: Craniocervical junction aligned. Anatomic alignment of the cervical elements. No subluxation. Skull base and vertebrae: No acute fracture at the skullbase. Vertebral body heights relatively maintained. No acute fracture identified. Soft tissues and spinal canal: Lymph nodes present though not enlarged. Soft tissue nodule of the right thyroid measures 3.2 cm. No hematoma or soft tissue swelling. No canal hematoma. Disc levels: Mild disc space narrowing throughout the cervical spine with no bony canal narrowing or significant foraminal narrowing. Upper chest: Unremarkable appearance of the lung apices. Other: No bony canal narrowing. IMPRESSION: Head CT: No acute finding. Inspissated secretions or potentially a polyp of the anterior right ethmoid air cells. Air-fluid level of the left maxillary sinus with frothy secretions of the right sphenoid sinus, compatible with paranasal sinus disease. Cervical CT: No CT evidence of acute fracture malalignment of  the cervical spine. Right thyroid nodule, incompletely characterized by CT. If there is concern for further evaluation, recommend referral for outpatient ultrasound. Electronically Signed   By: Corrie Mckusick D.O.   On: 05/18/2017 10:38   Ct Cervical Spine Wo Contrast  Result Date: 05/18/2017 CLINICAL DATA:  72 year old female with a history of fall EXAM: CT HEAD WITHOUT CONTRAST CT CERVICAL SPINE WITHOUT CONTRAST TECHNIQUE: Multidetector CT imaging of the head and cervical spine was performed following  the standard protocol without intravenous contrast. Multiplanar CT image reconstructions of the cervical spine were also generated. COMPARISON:  Multiple prior, most recent 03/20/2017, 03/19/2017, 03/31/2015, 12/12/2009 FINDINGS: CT HEAD FINDINGS Brain: No acute intracranial hemorrhage. No midline shift or mass effect. Gray-white differentiation maintained. Unremarkable appearance of the ventricular system. Vascular: Calcifications of the anterior circulation and posterior circulation. Skull: No acute fracture.  No aggressive bone lesion identified. Sinuses/Orbits: Frontal sinuses are clear. Fluid level of the left maxillary sinus. Frothy secretions of the right maxillary sinus. Rounded intermediate density material within the anterior right ethmoid air cell, contiguous from the inferior recess of the right frontal sinus. Other: None CT CERVICAL SPINE FINDINGS Alignment: Craniocervical junction aligned. Anatomic alignment of the cervical elements. No subluxation. Skull base and vertebrae: No acute fracture at the skullbase. Vertebral body heights relatively maintained. No acute fracture identified. Soft tissues and spinal canal: Lymph nodes present though not enlarged. Soft tissue nodule of the right thyroid measures 3.2 cm. No hematoma or soft tissue swelling. No canal hematoma. Disc levels: Mild disc space narrowing throughout the cervical spine with no bony canal narrowing or significant foraminal narrowing. Upper chest: Unremarkable appearance of the lung apices. Other: No bony canal narrowing. IMPRESSION: Head CT: No acute finding. Inspissated secretions or potentially a polyp of the anterior right ethmoid air cells. Air-fluid level of the left maxillary sinus with frothy secretions of the right sphenoid sinus, compatible with paranasal sinus disease. Cervical CT: No CT evidence of acute fracture malalignment of the cervical spine. Right thyroid nodule, incompletely characterized by CT. If there is concern for  further evaluation, recommend referral for outpatient ultrasound. Electronically Signed   By: Corrie Mckusick D.O.   On: 05/18/2017 10:38   Dg Abd 2 Views  Result Date: 05/18/2017 CLINICAL DATA:  Abdominal and epigastric pain. EXAM: ABDOMEN - 2 VIEW COMPARISON:  None. FINDINGS: The bowel gas pattern is normal. There is no evidence of free air. Right upper quadrant surgical clips from prior cholecystectomy. Elevation of right hemidiaphragm also noted. Aortoiliac atherosclerotic calcification present. No radio-opaque calculi or other significant radiographic abnormality is seen. IMPRESSION: Unremarkable bowel gas pattern.  No acute findings. Electronically Signed   By: Earle Gell M.D.   On: 05/18/2017 13:52      Subjective: She is feeling well, denies bloody stool.   Discharge Exam: Vitals:   05/21/17 0545 05/21/17 1114  BP: (!) 151/79 (!) 156/75  Pulse: 89 95  Resp: 16 18  Temp: 98.8 F (37.1 C)   SpO2: 98% 97%   Vitals:   05/20/17 1352 05/20/17 2309 05/21/17 0545 05/21/17 1114  BP: (!) 148/74 (!) 151/83 (!) 151/79 (!) 156/75  Pulse: 89 82 89 95  Resp: 15 16 16 18   Temp:  98.8 F (37.1 C) 98.8 F (37.1 C)   TempSrc:  Oral Oral   SpO2: 100% 100% 98% 97%  Weight:      Height:        General: Pt is alert, awake, not in acute distress Cardiovascular: RRR,  S1/S2 +, no rubs, no gallops Respiratory: CTA bilaterally, no wheezing, no rhonchi Abdominal: Soft, NT, ND, bowel sounds + Extremities: no edema, no cyanosis    The results of significant diagnostics from this hospitalization (including imaging, microbiology, ancillary and laboratory) are listed below for reference.     Microbiology: No results found for this or any previous visit (from the past 240 hour(s)).   Labs: BNP (last 3 results) No results for input(s): BNP in the last 8760 hours. Basic Metabolic Panel: Recent Labs  Lab 05/18/17 0718 05/19/17 0859 05/20/17 0458 05/21/17 0327  NA 132* 140 140 140  K 4.5  3.6 3.9 3.4*  CL 96* 111 113* 113*  CO2 21* 21* 17* 22  GLUCOSE 136* 88 77 106*  BUN 71* 36* 15 <5*  CREATININE 2.05* 1.32* 0.94 0.78  CALCIUM 10.0 8.8* 8.5* 8.3*   Liver Function Tests: Recent Labs  Lab 05/19/17 0859  AST 35  ALT 26  ALKPHOS 133*  BILITOT 1.0  PROT 6.5  ALBUMIN 2.7*   No results for input(s): LIPASE, AMYLASE in the last 168 hours. No results for input(s): AMMONIA in the last 168 hours. CBC: Recent Labs  Lab 05/18/17 0718 05/19/17 0113 05/19/17 0859 05/20/17 0458 05/21/17 0938  WBC 5.4 5.2 4.1 4.7  --   HGB 7.4* 11.2* 10.4* 10.1* 10.2*  HCT 22.3* 32.7* 31.1* 30.8* 31.1*  MCV 88.5 87.4 88.1 89.5  --   PLT 298 264 268 293  --    Cardiac Enzymes: No results for input(s): CKTOTAL, CKMB, CKMBINDEX, TROPONINI in the last 168 hours. BNP: Invalid input(s): POCBNP CBG: Recent Labs  Lab 05/20/17 1149 05/20/17 1247 05/20/17 1642 05/20/17 2126 05/21/17 0758  GLUCAP 69 112* 175* 130* 90   D-Dimer No results for input(s): DDIMER in the last 72 hours. Hgb A1c Recent Labs    05/18/17 1648  HGBA1C 6.5*   Lipid Profile No results for input(s): CHOL, HDL, LDLCALC, TRIG, CHOLHDL, LDLDIRECT in the last 72 hours. Thyroid function studies Recent Labs    05/19/17 0859  TSH 0.618   Anemia work up No results for input(s): VITAMINB12, FOLATE, FERRITIN, TIBC, IRON, RETICCTPCT in the last 72 hours. Urinalysis    Component Value Date/Time   COLORURINE YELLOW 05/18/2017 0842   APPEARANCEUR HAZY (A) 05/18/2017 0842   LABSPEC 1.006 05/18/2017 0842   PHURINE 5.0 05/18/2017 0842   GLUCOSEU NEGATIVE 05/18/2017 0842   HGBUR NEGATIVE 05/18/2017 0842   HGBUR trace-intact 05/14/2010 1402   BILIRUBINUR NEGATIVE 05/18/2017 0842   KETONESUR NEGATIVE 05/18/2017 0842   PROTEINUR NEGATIVE 05/18/2017 0842   UROBILINOGEN 1.0 03/30/2015 1540   NITRITE NEGATIVE 05/18/2017 0842   LEUKOCYTESUR LARGE (A) 05/18/2017 0842   Sepsis Labs Invalid input(s): PROCALCITONIN,   WBC,  LACTICIDVEN Microbiology No results found for this or any previous visit (from the past 240 hour(s)).   Time coordinating discharge: Over 30 minutes  SIGNED:   Elmarie Shiley, MD  [facility] 05/21/2017, 2:18 PM Pager   If 7PM-7AM, please contact night-coverage [email or web address] Password [email or web address]

## 2017-05-21 NOTE — Progress Notes (Signed)
Patient was discharged home by MD order; discharged instructions review and give to patient with care notes and prescriptions; IV DIC; patient will be escorted to the car by a volunteer via wheelchair.  

## 2017-05-22 ENCOUNTER — Encounter (HOSPITAL_COMMUNITY): Payer: Self-pay | Admitting: Gastroenterology

## 2017-07-15 ENCOUNTER — Other Ambulatory Visit: Payer: Self-pay | Admitting: Internal Medicine

## 2017-07-15 DIAGNOSIS — E2839 Other primary ovarian failure: Secondary | ICD-10-CM

## 2017-07-27 ENCOUNTER — Inpatient Hospital Stay
Admission: RE | Admit: 2017-07-27 | Discharge: 2017-07-27 | Disposition: A | Discharge status: Home / Self Care | Payer: Medicare HMO | Source: Ambulatory Visit | Attending: Internal Medicine | Admitting: Internal Medicine

## 2017-09-23 ENCOUNTER — Other Ambulatory Visit: Payer: Medicare HMO

## 2017-10-02 ENCOUNTER — Encounter (HOSPITAL_COMMUNITY): Payer: Self-pay | Admitting: Emergency Medicine

## 2017-10-02 ENCOUNTER — Emergency Department (HOSPITAL_COMMUNITY)
Admission: EM | Admit: 2017-10-02 | Discharge: 2017-10-02 | Disposition: A | Discharge status: Home / Self Care | Payer: Medicare HMO | Attending: Emergency Medicine | Admitting: Emergency Medicine

## 2017-10-02 ENCOUNTER — Emergency Department (HOSPITAL_COMMUNITY): Payer: Medicare HMO

## 2017-10-02 DIAGNOSIS — E860 Dehydration: Secondary | ICD-10-CM | POA: Diagnosis not present

## 2017-10-02 DIAGNOSIS — E119 Type 2 diabetes mellitus without complications: Secondary | ICD-10-CM | POA: Diagnosis not present

## 2017-10-02 DIAGNOSIS — Z79899 Other long term (current) drug therapy: Secondary | ICD-10-CM | POA: Diagnosis not present

## 2017-10-02 DIAGNOSIS — R05 Cough: Secondary | ICD-10-CM | POA: Diagnosis not present

## 2017-10-02 DIAGNOSIS — R531 Weakness: Secondary | ICD-10-CM | POA: Diagnosis not present

## 2017-10-02 DIAGNOSIS — I959 Hypotension, unspecified: Secondary | ICD-10-CM | POA: Diagnosis present

## 2017-10-02 LAB — CBC WITH DIFFERENTIAL/PLATELET
Basophils Absolute: 0 10*3/uL (ref 0.0–0.1)
Basophils Relative: 0 %
Eosinophils Absolute: 0.4 10*3/uL (ref 0.0–0.7)
Eosinophils Relative: 4 %
HEMATOCRIT: 34.2 % — AB (ref 36.0–46.0)
HEMOGLOBIN: 11.3 g/dL — AB (ref 12.0–15.0)
LYMPHS PCT: 38 %
Lymphs Abs: 3.2 10*3/uL (ref 0.7–4.0)
MCH: 29.2 pg (ref 26.0–34.0)
MCHC: 33 g/dL (ref 30.0–36.0)
MCV: 88.4 fL (ref 78.0–100.0)
MONOS PCT: 5 %
Monocytes Absolute: 0.4 10*3/uL (ref 0.1–1.0)
NEUTROS ABS: 4.6 10*3/uL (ref 1.7–7.7)
NEUTROS PCT: 53 %
Platelets: 310 10*3/uL (ref 150–400)
RBC: 3.87 MIL/uL (ref 3.87–5.11)
RDW: 14.5 % (ref 11.5–15.5)
WBC: 8.6 10*3/uL (ref 4.0–10.5)

## 2017-10-02 LAB — COMPREHENSIVE METABOLIC PANEL
ALK PHOS: 80 U/L (ref 38–126)
ALT: 10 U/L — ABNORMAL LOW (ref 14–54)
AST: 19 U/L (ref 15–41)
Albumin: 2.9 g/dL — ABNORMAL LOW (ref 3.5–5.0)
Anion gap: 9 (ref 5–15)
BILIRUBIN TOTAL: 0.5 mg/dL (ref 0.3–1.2)
BUN: 13 mg/dL (ref 6–20)
CALCIUM: 8.9 mg/dL (ref 8.9–10.3)
CO2: 23 mmol/L (ref 22–32)
Chloride: 107 mmol/L (ref 101–111)
Creatinine, Ser: 1.14 mg/dL — ABNORMAL HIGH (ref 0.44–1.00)
GFR calc Af Amer: 55 mL/min — ABNORMAL LOW (ref >60)
GFR, EST NON AFRICAN AMERICAN: 48 mL/min — AB (ref >60)
Glucose, Bld: 132 mg/dL — ABNORMAL HIGH (ref 65–99)
POTASSIUM: 4 mmol/L (ref 3.5–5.1)
Sodium: 139 mmol/L (ref 135–145)
TOTAL PROTEIN: 6.4 g/dL — AB (ref 6.5–8.1)

## 2017-10-02 MED ORDER — ACETAMINOPHEN 325 MG PO TABS
650.0000 mg | ORAL_TABLET | Freq: Once | ORAL | Status: AC
  Administered 2017-10-02: 650 mg via ORAL
  Filled 2017-10-02: qty 2

## 2017-10-02 NOTE — ED Notes (Signed)
Patient provided with applesauce and a sandwich

## 2017-10-02 NOTE — Discharge Instructions (Signed)
It was our pleasure to provide your ER care today - we hope that you feel better.  Rest. Drink plenty of fluids. Hold (do not take) your blood pressure medication for the next couple days.   Follow up with primary care doctor this week for recheck/recheck of blood pressure.  Return to ER if worse, new symptoms, fevers, chest pain, trouble breathing, other concern.

## 2017-10-02 NOTE — ED Triage Notes (Signed)
Pt reports started feeling dizzy earlier and took her Bp and was 675/44, then 90 systolic. Pt states that she has high BP and took her medications today. Pt reports that she gets lightheaded when up moving around.  Reports headache started earlier today. Reports been getting over flu and PNA.

## 2017-10-02 NOTE — ED Notes (Signed)
Pt adds still having cough with yellow mucous.

## 2017-10-02 NOTE — ED Provider Notes (Signed)
[facility] DEPT Provider Note   CSN: [id number] Arrival date & time: 10/02/17  1746     History   Chief Complaint Chief Complaint  Patient presents with  . Hypotension  . Cough    HPI Chloe Harding is a 72 y.o. female.  Patient c/o checking bp at home and it was low for her, low 100's over 60's earlier today. Does indicate recent non productive cough. No chest pain or discomfort. No sob. States is getting over the flu. Does not relatively poor po intake in past day, denies vomiting or diarrhea. No abd pain. Denies any fever or chills. Compliant w home meds, denies any change in meds, except to say is on low dose of her bp med as her bp has been very well controlled. Denies dysuria or gu c/o.   The history is provided by the patient.  Dizziness  Associated symptoms: no chest pain and no shortness of breath   Cough  Pertinent negatives include no chest pain, no chills, no sore throat, no shortness of breath and no eye redness.    Past Medical History:  Diagnosis Date  . Arthritis   . Depression   . Diabetes mellitus   . Hiatal hernia   . Hypercholesteremia   . Hypertension   . Reflux   . Renal disorder    shutting down 4 years ago    Patient Active Problem List   Diagnosis Date Noted  . Symptomatic anemia 05/18/2017  . Acute GI bleeding 05/18/2017  . Acute-on-chronic kidney injury ([place]) 05/18/2017  . Hypertension 05/18/2017  . Hiatal hernia 05/18/2017  . History of esophageal stricture 05/18/2017  . Hypercholesteremia 05/18/2017  . Diabetes mellitus 05/18/2017  . Solitary thyroid nodule 05/18/2017  . Hypotension 03/30/2015  . Sepsis ([place]) 03/30/2015  . CAP (community acquired pneumonia) 03/30/2015  . Acute encephalopathy 03/30/2015  . Hyperkalemia 03/30/2015  . Acute kidney injury ([place]) 03/30/2015  . Nausea with vomiting 03/30/2015  . Diabetic neuropathy ([place]) 03/30/2015  . OBESITY 03/11/2010  . Type 2 diabetes mellitus ([place])  12/26/2009  . Dyslipidemia 12/26/2009  . ANEMIA 12/26/2009  . DEPRESSION 12/26/2009  . HYPERTENSION, BENIGN ESSENTIAL 12/26/2009  . GERD 12/26/2009  . HIATAL HERNIA 12/26/2009  . CHRONIC KIDNEY DISEASE STAGE I 12/26/2009  . UTI 12/26/2009  . ARTHRITIS 12/26/2009  . ARTHROSCOPY, RIGHT KNEE, HX OF 12/26/2009    Past Surgical History:  Procedure Laterality Date  . CHOLECYSTECTOMY    . COLONOSCOPY WITH PROPOFOL N/A 05/20/2017   Procedure: COLONOSCOPY WITH PROPOFOL;  Surgeon: Carol Ada, MD;  Location: [facility];  Service: Endoscopy;  Laterality: N/A;  . ESOPHAGOGASTRODUODENOSCOPY N/A 05/19/2017   Procedure: ESOPHAGOGASTRODUODENOSCOPY (EGD);  Surgeon: Juanita Craver, MD;  Location[facility] ENDOSCOPY;  Service: Endoscopy;  Laterality: N/A;     OB History   None      Home Medications    Prior to Admission medications   Medication Sig Start Date End Date Taking? Authorizing Provider  amitriptyline (ELAVIL) 25 MG tablet Take 25 mg by mouth at bedtime.    [provider]  gabapentin (NEURONTIN) 600 MG tablet Take 1 tablet (600 mg total) by mouth 2 (two) times daily. 05/21/17   Regalado, Belkys A, MD  lisinopril (PRINIVIL,ZESTRIL) 10 MG tablet Take 10 mg by mouth daily.    [provider]  pantoprazole (PROTONIX) 40 MG tablet Take 1 tablet (40 mg total) by mouth 2 (two) times daily. 05/21/17 05/21/18  Regalado, Cassie Freer, MD  pravastatin (PRAVACHOL)  20 MG tablet Take 20 mg by mouth at bedtime.     [provider]  rOPINIRole (REQUIP) 2 MG tablet Take 2 mg by mouth at bedtime.    [provider]  traMADol (ULTRAM) 50 MG tablet Take 50 mg by mouth 3 (three) times daily.     [provider]    Family History No family history on file.  Social History Social History   Tobacco Use  . Smoking status: Never Smoker  . Smokeless tobacco: Never Used  Substance Use Topics  . Alcohol use: No  . Drug use: No     Allergies   Amlodipine  besylate   Review of Systems Review of Systems  Constitutional: Negative for chills, diaphoresis and fever.  HENT: Negative for sore throat.   Eyes: Negative for redness.  Respiratory: Positive for cough. Negative for shortness of breath.   Cardiovascular: Negative for chest pain.  Gastrointestinal: Negative for abdominal pain.  Genitourinary: Negative for dysuria, flank pain and frequency.  Musculoskeletal: Negative for back pain and neck pain.  Skin: Negative for rash.  Neurological: Negative for syncope.       Slight headache currently.   Hematological: Does not bruise/bleed easily.  Psychiatric/Behavioral: Negative for confusion.     Physical Exam Updated Vital Signs BP 139/75 (BP Location: Left Arm)   Pulse 89   Temp 98.4 F (36.9 C) (Oral)   Resp 16   Ht 1.549 m (5\' 1" )   Wt 66.7 kg (147 lb)   SpO2 99%   BMI 27.78 kg/m   Physical Exam  Constitutional: She appears well-developed and well-nourished. No distress.  HENT:  Head: Atraumatic.  Mildly dry appearing mm.   Eyes: Conjunctivae are normal. No scleral icterus.  Neck: Neck supple. No tracheal deviation present.  No stiffness or rigidity  Cardiovascular: Normal rate, regular rhythm, normal heart sounds and intact distal pulses. Exam reveals no gallop and no friction rub.  No murmur heard. Pulmonary/Chest: Effort normal and breath sounds normal. No respiratory distress.  Abdominal: Soft. Normal appearance and bowel sounds are normal. She exhibits no distension. There is no tenderness.  Genitourinary:  Genitourinary Comments: No cva tenderness  Musculoskeletal: She exhibits no edema.  Neurological: She is alert.  Speech normal. Steady gait.   Skin: Skin is warm and dry. No rash noted. She is not diaphoretic.  Psychiatric: She has a normal mood and affect.  Nursing note and vitals reviewed.    ED Treatments / Results  Labs (all labs ordered are listed, but only abnormal results are displayed) Results  for orders placed or performed during the hospital encounter of 10/02/17  CBC with Differential  Result Value Ref Range   WBC 8.6 4.0 - 10.5 K/uL   RBC 3.87 3.87 - 5.11 MIL/uL   Hemoglobin 11.3 (L) 12.0 - 15.0 g/dL   HCT 34.2 (L) 36.0 - 46.0 %   MCV 88.4 78.0 - 100.0 fL   MCH 29.2 26.0 - 34.0 pg   MCHC 33.0 30.0 - 36.0 g/dL   RDW 14.5 11.5 - 15.5 %   Platelets 310 150 - 400 K/uL   Neutrophils Relative % 53 %   Neutro Abs 4.6 1.7 - 7.7 K/uL   Lymphocytes Relative 38 %   Lymphs Abs 3.2 0.7 - 4.0 K/uL   Monocytes Relative 5 %   Monocytes Absolute 0.4 0.1 - 1.0 K/uL   Eosinophils Relative 4 %   Eosinophils Absolute 0.4 0.0 - 0.7 K/uL   Basophils Relative  0 %   Basophils Absolute 0.0 0.0 - 0.1 K/uL  Comprehensive metabolic panel  Result Value Ref Range   Sodium 139 135 - 145 mmol/L   Potassium 4.0 3.5 - 5.1 mmol/L   Chloride 107 101 - 111 mmol/L   CO2 23 22 - 32 mmol/L   Glucose, Bld 132 (H) 65 - 99 mg/dL   BUN 13 6 - 20 mg/dL   Creatinine, Ser 1.14 (H) 0.44 - 1.00 mg/dL   Calcium 8.9 8.9 - 10.3 mg/dL   Total Protein 6.4 (L) 6.5 - 8.1 g/dL   Albumin 2.9 (L) 3.5 - 5.0 g/dL   AST 19 15 - 41 U/L   ALT 10 (L) 14 - 54 U/L   Alkaline Phosphatase 80 38 - 126 U/L   Total Bilirubin 0.5 0.3 - 1.2 mg/dL   GFR calc non Af Amer 48 (L) >60 mL/min   GFR calc Af Amer 55 (L) >60 mL/min   Anion gap 9 5 - 15   Dg Chest 2 View  Result Date: 10/02/2017 CLINICAL DATA:  Productive cough x2 weeks. EXAM: CHEST - 2 VIEW COMPARISON:  05/18/2017 FINDINGS: Chronic elevation of the right hemidiaphragm. Heart size is normal. Tortuous atherosclerotic ectatic thoracic aorta. No acute pulmonary consolidation nor edema. No effusion or pneumothorax. Degenerative changes are seen about the glenohumeral joints left greater than right. IMPRESSION: No active cardiopulmonary disease. Aortic atherosclerosis. Chronic elevation of right hemidiaphragm. Electronically Signed   By: Ashley Royalty M.D.   On: 10/02/2017 18:15     EKG None  Radiology Dg Chest 2 View  Result Date: 10/02/2017 CLINICAL DATA:  Productive cough x2 weeks. EXAM: CHEST - 2 VIEW COMPARISON:  05/18/2017 FINDINGS: Chronic elevation of the right hemidiaphragm. Heart size is normal. Tortuous atherosclerotic ectatic thoracic aorta. No acute pulmonary consolidation nor edema. No effusion or pneumothorax. Degenerative changes are seen about the glenohumeral joints left greater than right. IMPRESSION: No active cardiopulmonary disease. Aortic atherosclerosis. Chronic elevation of right hemidiaphragm. Electronically Signed   By: Ashley Royalty M.D.   On: 10/02/2017 18:15    Procedures Procedures (including critical care time)  Medications Ordered in ED Medications - No data to display   Initial Impression / Assessment and Plan / ED Course  I have reviewed the triage vital signs and the nursing notes.  Pertinent labs & imaging results that were available during my care of the patient were reviewed by me and considered in my medical decision making (see chart for details).  Reviewed nursing notes and prior charts for additional history.   Xray reviewed - no pna.   Labs reviewed - c/w baseline except mild increase in cr from prior, ?mild volume depletion. Pt is taking po fluids well in ED, and bp recheck x 2 normal.   Acetaminophen po (for slight headache). Additional po fluids.   Pt currently appears stable for d/c.     Final Clinical Impressions(s) / ED Diagnoses   Final diagnoses:  None    ED Discharge Orders    None       Lajean Saver, MD 10/02/17 2250

## 2017-10-22 ENCOUNTER — Other Ambulatory Visit: Payer: Self-pay | Admitting: Family Medicine

## 2017-10-22 DIAGNOSIS — Z1231 Encounter for screening mammogram for malignant neoplasm of breast: Secondary | ICD-10-CM

## 2017-10-25 ENCOUNTER — Ambulatory Visit
Admission: RE | Admit: 2017-10-25 | Discharge: 2017-10-25 | Disposition: A | Discharge status: Home / Self Care | Payer: Medicare HMO | Source: Ambulatory Visit | Attending: Internal Medicine | Admitting: Internal Medicine

## 2017-10-25 DIAGNOSIS — E2839 Other primary ovarian failure: Secondary | ICD-10-CM

## 2017-11-10 ENCOUNTER — Ambulatory Visit
Admission: RE | Admit: 2017-11-10 | Discharge: 2017-11-10 | Disposition: A | Discharge status: Home / Self Care | Payer: Medicare HMO | Source: Ambulatory Visit | Attending: Family Medicine | Admitting: Family Medicine

## 2017-11-10 DIAGNOSIS — Z1231 Encounter for screening mammogram for malignant neoplasm of breast: Secondary | ICD-10-CM

## 2017-11-11 ENCOUNTER — Other Ambulatory Visit: Payer: Self-pay | Admitting: Family Medicine

## 2017-11-11 DIAGNOSIS — R928 Other abnormal and inconclusive findings on diagnostic imaging of breast: Secondary | ICD-10-CM

## 2017-11-15 ENCOUNTER — Ambulatory Visit
Admission: RE | Admit: 2017-11-15 | Discharge: 2017-11-15 | Disposition: A | Discharge status: Home / Self Care | Payer: Medicare HMO | Source: Ambulatory Visit | Attending: Family Medicine | Admitting: Family Medicine

## 2017-11-15 ENCOUNTER — Other Ambulatory Visit: Payer: Self-pay | Admitting: Family Medicine

## 2017-11-15 DIAGNOSIS — R928 Other abnormal and inconclusive findings on diagnostic imaging of breast: Secondary | ICD-10-CM

## 2017-11-15 DIAGNOSIS — R921 Mammographic calcification found on diagnostic imaging of breast: Secondary | ICD-10-CM

## 2017-11-19 ENCOUNTER — Ambulatory Visit
Admission: RE | Admit: 2017-11-19 | Discharge: 2017-11-19 | Disposition: A | Discharge status: Home / Self Care | Payer: Medicare HMO | Source: Ambulatory Visit | Attending: Family Medicine | Admitting: Family Medicine

## 2017-11-19 DIAGNOSIS — R921 Mammographic calcification found on diagnostic imaging of breast: Secondary | ICD-10-CM

## 2019-01-14 DIAGNOSIS — K222 Esophageal obstruction: Principal | ICD-10-CM | POA: Diagnosis present

## 2019-01-14 DIAGNOSIS — Z8701 Personal history of pneumonia (recurrent): Secondary | ICD-10-CM

## 2019-01-14 DIAGNOSIS — N181 Chronic kidney disease, stage 1: Secondary | ICD-10-CM | POA: Diagnosis present

## 2019-01-14 DIAGNOSIS — Z9049 Acquired absence of other specified parts of digestive tract: Secondary | ICD-10-CM

## 2019-01-14 DIAGNOSIS — K228 Other specified diseases of esophagus: Secondary | ICD-10-CM | POA: Diagnosis present

## 2019-01-14 DIAGNOSIS — M1990 Unspecified osteoarthritis, unspecified site: Secondary | ICD-10-CM | POA: Diagnosis present

## 2019-01-14 DIAGNOSIS — Z888 Allergy status to other drugs, medicaments and biological substances status: Secondary | ICD-10-CM

## 2019-01-14 DIAGNOSIS — Z20828 Contact with and (suspected) exposure to other viral communicable diseases: Secondary | ICD-10-CM | POA: Diagnosis present

## 2019-01-14 DIAGNOSIS — K2270 Barrett's esophagus without dysplasia: Secondary | ICD-10-CM | POA: Diagnosis present

## 2019-01-14 DIAGNOSIS — E042 Nontoxic multinodular goiter: Secondary | ICD-10-CM | POA: Diagnosis present

## 2019-01-14 DIAGNOSIS — K219 Gastro-esophageal reflux disease without esophagitis: Secondary | ICD-10-CM | POA: Diagnosis present

## 2019-01-14 DIAGNOSIS — E1122 Type 2 diabetes mellitus with diabetic chronic kidney disease: Secondary | ICD-10-CM | POA: Diagnosis present

## 2019-01-14 DIAGNOSIS — Z8719 Personal history of other diseases of the digestive system: Secondary | ICD-10-CM

## 2019-01-14 DIAGNOSIS — E785 Hyperlipidemia, unspecified: Secondary | ICD-10-CM | POA: Diagnosis present

## 2019-01-14 DIAGNOSIS — K5750 Diverticulosis of both small and large intestine without perforation or abscess without bleeding: Secondary | ICD-10-CM | POA: Diagnosis present

## 2019-01-14 DIAGNOSIS — E1140 Type 2 diabetes mellitus with diabetic neuropathy, unspecified: Secondary | ICD-10-CM | POA: Diagnosis present

## 2019-01-14 DIAGNOSIS — Z791 Long term (current) use of non-steroidal anti-inflammatories (NSAID): Secondary | ICD-10-CM

## 2019-01-14 DIAGNOSIS — K449 Diaphragmatic hernia without obstruction or gangrene: Secondary | ICD-10-CM | POA: Diagnosis present

## 2019-01-14 DIAGNOSIS — Z79899 Other long term (current) drug therapy: Secondary | ICD-10-CM

## 2019-01-14 DIAGNOSIS — I129 Hypertensive chronic kidney disease with stage 1 through stage 4 chronic kidney disease, or unspecified chronic kidney disease: Secondary | ICD-10-CM | POA: Diagnosis present

## 2019-01-14 DIAGNOSIS — N179 Acute kidney failure, unspecified: Secondary | ICD-10-CM | POA: Diagnosis present

## 2019-01-14 DIAGNOSIS — K2970 Gastritis, unspecified, without bleeding: Secondary | ICD-10-CM | POA: Diagnosis present

## 2019-01-15 ENCOUNTER — Emergency Department (HOSPITAL_COMMUNITY): Payer: Medicare Other

## 2019-01-15 ENCOUNTER — Encounter (HOSPITAL_COMMUNITY): Payer: Self-pay | Admitting: Orthopedic Surgery

## 2019-01-15 ENCOUNTER — Inpatient Hospital Stay (HOSPITAL_COMMUNITY)
Admission: EM | Admit: 2019-01-15 | Discharge: 2019-01-18 | DRG: 392 | Disposition: A | Discharge status: Home / Self Care | Payer: Medicare Other | Attending: Family Medicine | Admitting: Family Medicine

## 2019-01-15 ENCOUNTER — Other Ambulatory Visit: Payer: Self-pay

## 2019-01-15 ENCOUNTER — Observation Stay (HOSPITAL_COMMUNITY): Payer: Medicare Other

## 2019-01-15 DIAGNOSIS — E041 Nontoxic single thyroid nodule: Secondary | ICD-10-CM

## 2019-01-15 DIAGNOSIS — N179 Acute kidney failure, unspecified: Secondary | ICD-10-CM | POA: Diagnosis not present

## 2019-01-15 DIAGNOSIS — K222 Esophageal obstruction: Secondary | ICD-10-CM | POA: Diagnosis not present

## 2019-01-15 DIAGNOSIS — R634 Abnormal weight loss: Secondary | ICD-10-CM

## 2019-01-15 DIAGNOSIS — R1310 Dysphagia, unspecified: Secondary | ICD-10-CM | POA: Diagnosis not present

## 2019-01-15 LAB — CBC WITH DIFFERENTIAL/PLATELET
Abs Immature Granulocytes: 0.01 10*3/uL (ref 0.00–0.07)
Basophils Absolute: 0 10*3/uL (ref 0.0–0.1)
Basophils Relative: 0 %
Eosinophils Absolute: 0.1 10*3/uL (ref 0.0–0.5)
Eosinophils Relative: 3 %
HCT: 37.2 % (ref 36.0–46.0)
Hemoglobin: 11.9 g/dL — ABNORMAL LOW (ref 12.0–15.0)
Immature Granulocytes: 0 %
Lymphocytes Relative: 37 %
Lymphs Abs: 2 10*3/uL (ref 0.7–4.0)
MCH: 30.1 pg (ref 26.0–34.0)
MCHC: 32 g/dL (ref 30.0–36.0)
MCV: 94.2 fL (ref 80.0–100.0)
Monocytes Absolute: 0.3 10*3/uL (ref 0.1–1.0)
Monocytes Relative: 6 %
Neutro Abs: 2.8 10*3/uL (ref 1.7–7.7)
Neutrophils Relative %: 54 %
Platelets: 199 10*3/uL (ref 150–400)
RBC: 3.95 MIL/uL (ref 3.87–5.11)
RDW: 12.9 % (ref 11.5–15.5)
WBC: 5.3 10*3/uL (ref 4.0–10.5)
nRBC: 0 % (ref 0.0–0.2)

## 2019-01-15 LAB — PROTIME-INR
INR: 1.1 (ref 0.8–1.2)
Prothrombin Time: 13.8 seconds (ref 11.4–15.2)

## 2019-01-15 LAB — CBG MONITORING, ED: Glucose-Capillary: 111 mg/dL — ABNORMAL HIGH (ref 70–99)

## 2019-01-15 LAB — COMPREHENSIVE METABOLIC PANEL
ALT: 12 U/L (ref 0–44)
AST: 24 U/L (ref 15–41)
Albumin: 3.7 g/dL (ref 3.5–5.0)
Alkaline Phosphatase: 77 U/L (ref 38–126)
Anion gap: 8 (ref 5–15)
BUN: 19 mg/dL (ref 8–23)
CO2: 25 mmol/L (ref 22–32)
Calcium: 9.1 mg/dL (ref 8.9–10.3)
Chloride: 111 mmol/L (ref 98–111)
Creatinine, Ser: 1.42 mg/dL — ABNORMAL HIGH (ref 0.44–1.00)
GFR calc Af Amer: 43 mL/min — ABNORMAL LOW (ref >60)
GFR calc non Af Amer: 37 mL/min — ABNORMAL LOW (ref >60)
Glucose, Bld: 129 mg/dL — ABNORMAL HIGH (ref 70–99)
Potassium: 3.5 mmol/L (ref 3.5–5.1)
Sodium: 144 mmol/L (ref 135–145)
Total Bilirubin: 0.7 mg/dL (ref 0.3–1.2)
Total Protein: 7.2 g/dL (ref 6.5–8.1)

## 2019-01-15 LAB — GLUCOSE, CAPILLARY
Glucose-Capillary: 70 mg/dL (ref 70–99)
Glucose-Capillary: 73 mg/dL (ref 70–99)
Glucose-Capillary: 113 mg/dL — ABNORMAL HIGH (ref 70–99)

## 2019-01-15 LAB — URINALYSIS, ROUTINE W REFLEX MICROSCOPIC
Bilirubin Urine: NEGATIVE
Glucose, UA: NEGATIVE mg/dL
Hgb urine dipstick: NEGATIVE
Ketones, ur: NEGATIVE mg/dL
Leukocytes,Ua: NEGATIVE
Nitrite: NEGATIVE
Protein, ur: NEGATIVE mg/dL
Specific Gravity, Urine: 1.005 (ref 1.005–1.030)
pH: 6 (ref 5.0–8.0)

## 2019-01-15 LAB — HEMOGLOBIN A1C
Hgb A1c MFr Bld: 5.2 % (ref 4.8–5.6)
Mean Plasma Glucose: 102.54 mg/dL

## 2019-01-15 LAB — TSH: TSH: 0.853 u[IU]/mL (ref 0.350–4.500)

## 2019-01-15 MED ORDER — ONDANSETRON HCL 4 MG/2ML IJ SOLN
4.0000 mg | Freq: Four times a day (QID) | INTRAMUSCULAR | Status: DC | PRN
  Administered 2019-01-15: 17:00:00 4 mg via INTRAVENOUS
  Filled 2019-01-15: qty 2

## 2019-01-15 MED ORDER — ROPINIROLE HCL 1 MG PO TABS
4.0000 mg | ORAL_TABLET | Freq: Every day | ORAL | Status: DC
  Administered 2019-01-15 – 2019-01-17 (×3): 4 mg via ORAL
  Filled 2019-01-15 (×3): qty 4

## 2019-01-15 MED ORDER — GABAPENTIN 300 MG PO CAPS
300.0000 mg | ORAL_CAPSULE | Freq: Every day | ORAL | Status: DC
  Administered 2019-01-15 – 2019-01-17 (×3): 300 mg via ORAL
  Filled 2019-01-15 (×3): qty 1

## 2019-01-15 MED ORDER — PROMETHAZINE HCL 25 MG/ML IJ SOLN
6.2500 mg | Freq: Four times a day (QID) | INTRAMUSCULAR | Status: DC | PRN
  Administered 2019-01-17 (×2): 6.25 mg via INTRAVENOUS
  Filled 2019-01-15 (×2): qty 1

## 2019-01-15 MED ORDER — IOHEXOL 300 MG/ML  SOLN: 30.0000 mg | Freq: Once | INTRAMUSCULAR | Status: DC | PRN

## 2019-01-15 MED ORDER — SODIUM CHLORIDE 0.9 % IV BOLUS (SEPSIS)
1000.0000 mL | Freq: Once | INTRAVENOUS | Status: AC
  Administered 2019-01-15: 01:00:00 1000 mL via INTRAVENOUS

## 2019-01-15 MED ORDER — ACETAMINOPHEN 325 MG PO TABS
650.0000 mg | ORAL_TABLET | ORAL | Status: DC | PRN
  Administered 2019-01-16 (×2): 650 mg via ORAL
  Filled 2019-01-15 (×2): qty 2

## 2019-01-15 MED ORDER — INSULIN ASPART 100 UNIT/ML ~~LOC~~ SOLN: 0.0000 [IU] | Freq: Three times a day (TID) | SUBCUTANEOUS | Status: DC

## 2019-01-15 MED ORDER — HYDRALAZINE HCL 20 MG/ML IJ SOLN
5.0000 mg | INTRAMUSCULAR | Status: DC | PRN
  Administered 2019-01-16 – 2019-01-17 (×3): 5 mg via INTRAVENOUS
  Filled 2019-01-15 (×3): qty 1

## 2019-01-15 MED ORDER — ENOXAPARIN SODIUM 40 MG/0.4ML ~~LOC~~ SOLN
40.0000 mg | SUBCUTANEOUS | Status: DC
  Administered 2019-01-15 – 2019-01-17 (×3): 40 mg via SUBCUTANEOUS
  Filled 2019-01-15 (×3): qty 0.4

## 2019-01-15 MED ORDER — DEXTROSE-NACL 5-0.9 % IV SOLN
INTRAVENOUS | Status: DC
  Administered 2019-01-15 – 2019-01-17 (×4): via INTRAVENOUS

## 2019-01-15 MED ORDER — AMITRIPTYLINE HCL 25 MG PO TABS
25.0000 mg | ORAL_TABLET | Freq: Every day | ORAL | Status: DC
  Administered 2019-01-15 – 2019-01-17 (×3): 25 mg via ORAL
  Filled 2019-01-15 (×4): qty 1

## 2019-01-15 MED ORDER — HYDROCODONE-ACETAMINOPHEN 5-325 MG PO TABS: 1.0000 | ORAL_TABLET | Freq: Four times a day (QID) | ORAL | Status: DC | PRN

## 2019-01-15 MED ORDER — LISINOPRIL 10 MG PO TABS
10.0000 mg | ORAL_TABLET | Freq: Every day | ORAL | Status: DC
  Administered 2019-01-16 – 2019-01-18 (×3): 10 mg via ORAL
  Filled 2019-01-15 (×3): qty 1

## 2019-01-15 MED ORDER — FOLIC ACID 1 MG PO TABS
1.0000 mg | ORAL_TABLET | Freq: Every day | ORAL | Status: DC
  Administered 2019-01-16 – 2019-01-18 (×2): 1 mg via ORAL
  Filled 2019-01-15 (×2): qty 1

## 2019-01-15 MED ORDER — PANTOPRAZOLE SODIUM 40 MG IV SOLR
40.0000 mg | Freq: Two times a day (BID) | INTRAVENOUS | Status: DC
  Administered 2019-01-15 – 2019-01-18 (×7): 40 mg via INTRAVENOUS
  Filled 2019-01-15 (×7): qty 40

## 2019-01-15 MED ORDER — SODIUM CHLORIDE 0.9 % IV SOLN
1000.0000 mL | INTRAVENOUS | Status: DC
  Administered 2019-01-15 (×2): 1000 mL via INTRAVENOUS

## 2019-01-15 NOTE — H&P (Signed)
History and Physical    Chloe Harding [id number] DOB: 10-Feb-1946 DOA: 01/15/2019  PCP: Elenore Paddy, FNP  Patient coming from: Home.   I have personally briefly reviewed patient's old medical records in [place]  Chief Complaint: difficulty and trouble with oral intake since 2 months.   HPI: Chloe Harding is a 73 y.o. female with medical history significant of hypertension, diabetes, depression, hiatal hernia, hyperlipidemia history of dysphagia about 15 years ago status post esophageal dilatation at [facility], presents today with complaints of difficulty taking both solids and liquids since 2 months she reports food stays in her esophagus and she has associated vomiting and sometimes spitting up food, associated with weight loss from 170 to 157 pounds in the last 2 months.  She denies having any fevers chills, shortness of breath, chest pain or cough.  She denies any melena or hematochezia.  She is complaining of dizziness the last 1 week but no episodes of syncope or headache or tingling or numbness in her hands or feet, blurry vision.  She denies any dysuria. She reports having similar issues with swallowing about 15 years ago and had esophageal dilatation done at [facility].  She also had recent EGD and colonoscopy in 2019 by Dr. Collene Mares.    ED Course: on arrival to ED, she was afebrile, hypertensive with bp 179/92 mmhg, lab work significant for 1.42, hemoglobin of 11.9. CT abdomen and pelvis and chest without contrast showed Moderate proximal esophageal dilatation with fluid level within.Transition in the lower esophagus to more mildly dilated suggestingeither benign or malignant stricture. This is in the region of a stricture on 05/31/2011 esophagram.  Review of Systems: As per HPI  All others reviewed and are negative,   Past Medical History:  Diagnosis Date   Arthritis    Depression    Diabetes mellitus    Hiatal hernia    Hypercholesteremia     Hypertension    Reflux    Renal disorder    shutting down 4 years ago    Past Surgical History:  Procedure Laterality Date   CHOLECYSTECTOMY     COLONOSCOPY WITH PROPOFOL N/A 05/20/2017   Procedure: COLONOSCOPY WITH PROPOFOL;  Surgeon: Carol Ada, MD;  Location: [place];  Service: Endoscopy;  Laterality: N/A;   ESOPHAGOGASTRODUODENOSCOPY N/A 05/19/2017   Procedure: ESOPHAGOGASTRODUODENOSCOPY (EGD);  Surgeon: Juanita Craver, MD;  Location: [facility] ENDOSCOPY;  Service: Endoscopy;  Laterality: N/A;   Social history.   reports that she has never smoked. She has never used smokeless tobacco. She reports that she does not drink alcohol or use drugs.  Allergies  Allergen Reactions   Amlodipine Besylate Swelling    swelling     No family history of Esophageal cancer   Prior to Admission medications   Medication Sig Start Date End Date Taking? Authorizing Provider  amitriptyline (ELAVIL) 25 MG tablet Take 25 mg by mouth at bedtime.   Yes [provider]  folic acid (FOLVITE) 1 MG tablet Take 1 mg by mouth daily.   Yes [provider]  gabapentin (NEURONTIN) 300 MG capsule Take 300 mg by mouth at bedtime.   Yes [provider]  HYDROcodone-acetaminophen (NORCO/VICODIN) 5-325 MG tablet Take 1 tablet by mouth every 6 (six) hours as needed for moderate pain.   Yes [provider]  lisinopril (PRINIVIL,ZESTRIL) 10 MG tablet Take 10 mg by mouth daily.   Yes [provider]  meloxicam (MOBIC) 15 MG tablet Take 15 mg by mouth daily.  Yes [provider]  omeprazole (PRILOSEC) 40 MG capsule Take 40 mg by mouth daily.   Yes [provider]  rOPINIRole (REQUIP) 4 MG tablet Take 4 mg by mouth at bedtime.   Yes [provider]  gabapentin (NEURONTIN) 600 MG tablet Take 1 tablet (600 mg total) by mouth 2 (two) times daily. Patient not taking: Reported on 01/15/2019 05/21/17   Regalado, Jerald Kief A, MD  pantoprazole (PROTONIX) 40 MG  tablet Take 1 tablet (40 mg total) by mouth 2 (two) times daily. Patient not taking: Reported on 01/15/2019 05/21/17 05/21/18  Elmarie Shiley, MD    Physical Exam: Vitals:   01/15/19 0400 01/15/19 0430 01/15/19 0730 01/15/19 0830  BP: (!) 165/78 (!) 145/80 (!) 170/87 130/66  Pulse: 84 84 85 80  Resp: 14 13 (!) 22 14  Temp:   97.8 F (36.6 C)   TempSrc:   Oral   SpO2: 100% 99% 100% 99%  Weight:      Height:        Constitutional: NAD, calm, comfortable Vitals:   01/15/19 0400 01/15/19 0430 01/15/19 0730 01/15/19 0830  BP: (!) 165/78 (!) 145/80 (!) 170/87 130/66  Pulse: 84 84 85 80  Resp: 14 13 (!) 22 14  Temp:   97.8 F (36.6 C)   TempSrc:   Oral   SpO2: 100% 99% 100% 99%  Weight:      Height:       Eyes: PERRL, lids and conjunctivae normal ENMT: Mucous membranes are moist. Posterior pharynx clear of any exudate or lesions.Normal dentition.  Neck: normal, supple, no masses, no thyromegaly Respiratory: clear to auscultation bilaterally, no wheezing, no crackles. Normal respiratory effort. No accessory muscle use.  Cardiovascular: Regular rate and rhythm, no murmurs / rubs / gallops. No extremity edema. 2+ pedal pulses. No carotid bruits.  Abdomen: no tenderness, no masses palpated. No hepatosplenomegaly. Bowel sounds positive.  Musculoskeletal: no clubbing / cyanosis. No joint deformity upper and lower extremities. Good ROM, no contractures. Normal muscle tone.  Skin: no rashes, lesions, ulcers. No induration Neurologic: CN 2-12 grossly intact. Sensation intact, DTR normal. Strength 5/5 in all 4.  Psychiatric: Normal judgment and insight. Alert and oriented x 3. Normal mood.   (Anything < 9 systems with 2 bullets each down codes to level 1) (If patient refuses exam cant bill higher level) (Make sure to document decubitus ulcers present on admission -- if possible -- and whether patient has chronic indwelling catheter at time of admission)  Labs on Admission: I have  personally reviewed following labs and imaging studies  CBC: Recent Labs  Lab 01/15/19 0126  WBC 5.3  NEUTROABS 2.8  HGB 11.9*  HCT 37.2  MCV 94.2  PLT 123XX123   Basic Metabolic Panel: Recent Labs  Lab 01/15/19 0126  NA 144  K 3.5  CL 111  CO2 25  GLUCOSE 129*  BUN 19  CREATININE 1.42*  CALCIUM 9.1   GFR: Estimated Creatinine Clearance: 32.8 mL/min (A) (by C-G formula based on SCr of 1.42 mg/dL (H)). Liver Function Tests: Recent Labs  Lab 01/15/19 0126  AST 24  ALT 12  ALKPHOS 77  BILITOT 0.7  PROT 7.2  ALBUMIN 3.7   No results for input(s): LIPASE, AMYLASE in the last 168 hours. No results for input(s): AMMONIA in the last 168 hours. Coagulation Profile: No results for input(s): INR, PROTIME in the last 168 hours. Cardiac Enzymes: No results for input(s): CKTOTAL, CKMB, CKMBINDEX, TROPONINI in the last 168  hours. BNP (last 3 results) No results for input(s): PROBNP in the last 8760 hours. HbA1C: No results for input(s): HGBA1C in the last 72 hours. CBG: Recent Labs  Lab 01/15/19 0100  GLUCAP 111*   Lipid Profile: No results for input(s): CHOL, HDL, LDLCALC, TRIG, CHOLHDL, LDLDIRECT in the last 72 hours. Thyroid Function Tests: No results for input(s): TSH, T4TOTAL, FREET4, T3FREE, THYROIDAB in the last 72 hours. Anemia Panel: No results for input(s): VITAMINB12, FOLATE, FERRITIN, TIBC, IRON, RETICCTPCT in the last 72 hours. Urine analysis:    Component Value Date/Time   COLORURINE YELLOW 05/18/2017 0842   APPEARANCEUR HAZY (A) 05/18/2017 0842   LABSPEC 1.006 05/18/2017 0842   PHURINE 5.0 05/18/2017 0842   GLUCOSEU NEGATIVE 05/18/2017 0842   HGBUR NEGATIVE 05/18/2017 0842   HGBUR trace-intact 05/14/2010 1402   BILIRUBINUR NEGATIVE 05/18/2017 0842   KETONESUR NEGATIVE 05/18/2017 0842   PROTEINUR NEGATIVE 05/18/2017 0842   UROBILINOGEN 1.0 03/30/2015 1540   NITRITE NEGATIVE 05/18/2017 0842   LEUKOCYTESUR LARGE (A) 05/18/2017 0842     Radiological Exams on Admission: Ct Abdomen Pelvis Wo Contrast  Result Date: 01/15/2019 CLINICAL DATA:  Dysphagia. Mild abdominal pain. Possible esophageal stricture. EXAM: CT CHEST, ABDOMEN AND PELVIS WITHOUT CONTRAST TECHNIQUE: Multidetector CT imaging of the chest, abdomen and pelvis was performed following the standard protocol without IV contrast. COMPARISON:  Chest radiograph 10/02/2017. Esophagram of 05/21/2011. Prior abdominopelvic CT 12/12/2009 FINDINGS: CT CHEST FINDINGS Cardiovascular: Aortic and branch vessel atherosclerosis. Tortuous thoracic aorta. Mild cardiomegaly, without pericardial effusion. Multivessel coronary artery atherosclerosis. Mediastinum/Nodes: Right-sided thyroid nodule of 2.2 cm on 01/02. No mediastinal or definite hilar adenopathy, given limitations of unenhanced CT. Small hiatal hernia. The esophagus is moderately dilated and fluid-filled. This continues to the level of a transition to more mildly dilated esophagus. Example transition site on coronal image 85. This is in the same vicinity of the stricture on the esophagram of 05/21/2011. Lungs/Pleura: No pleural fluid. Moderate right hemidiaphragm elevation. Right lower lobe adjacent volume loss and atelectasis. Right apical pleural-based pulmonary nodule of 4 mm on 21/4. Musculoskeletal: Mild T11 compression deformity with no ventral canal encroachment. CT ABDOMEN PELVIS FINDINGS Hepatobiliary: Normal liver. Cholecystectomy, without biliary ductal dilatation. Pancreas: Normal, without mass or ductal dilatation. Spleen: Normal in size, without focal abnormality. Adrenals/Urinary Tract: Normal adrenal glands. Mild renal cortical thinning bilaterally. Normal urinary bladder. Stomach/Bowel: Equivocal fold thickening involving the gastric body on 46/2. Periampullary duodenal diverticulum. Otherwise normal small bowel. Scattered colonic diverticula. Normal terminal ileum. Vascular/Lymphatic: Advanced aortic and branch vessel  atherosclerosis. No abdominopelvic adenopathy. Reproductive: Normal uterus and adnexa. Other: No significant free fluid.  Moderate pelvic floor laxity. Musculoskeletal: Osteopenia. Disc bulge at the lumbosacral junction. IMPRESSION: 1. Moderate proximal esophageal dilatation with fluid level within. Transition in the lower esophagus to more mildly dilated suggesting either benign or malignant stricture. This is in the region of a stricture on 05/31/2011 esophagram. 2. Right thyroid nodule which should be considered for nonemergent ultrasound. This follows ACR consensus guidelines: Managing Incidental Thyroid Nodules Detected on Imaging: White Paper of the ACR Incidental Thyroid Findings Committee. J Am Coll Radiol 2015; 12:143-150. 3. Coronary artery atherosclerosis. Aortic Atherosclerosis (ICD10-I70.0). 4.  Tiny hiatal hernia. 5. Equivocal gastric fold thickening as can be seen with gastritis. 6. Right apical pleural-based 4 mm nodule. No follow-up needed if patient is low-risk. Non-contrast chest CT can be considered in 12 months if patient is high-risk. This recommendation follows the consensus statement: Guidelines for Management of Incidental Pulmonary Nodules Detected on  CT Images: From the Fleischner Society 2017; Radiology 2017; [id number]. Electronically Signed   By: Abigail Miyamoto M.D.   On: 01/15/2019 05:41   Ct Chest Wo Contrast  Result Date: 01/15/2019 CLINICAL DATA:  Dysphagia. Mild abdominal pain. Possible esophageal stricture. EXAM: CT CHEST, ABDOMEN AND PELVIS WITHOUT CONTRAST TECHNIQUE: Multidetector CT imaging of the chest, abdomen and pelvis was performed following the standard protocol without IV contrast. COMPARISON:  Chest radiograph 10/02/2017. Esophagram of 05/21/2011. Prior abdominopelvic CT 12/12/2009 FINDINGS: CT CHEST FINDINGS Cardiovascular: Aortic and branch vessel atherosclerosis. Tortuous thoracic aorta. Mild cardiomegaly, without pericardial effusion. Multivessel coronary artery  atherosclerosis. Mediastinum/Nodes: Right-sided thyroid nodule of 2.2 cm on 01/02. No mediastinal or definite hilar adenopathy, given limitations of unenhanced CT. Small hiatal hernia. The esophagus is moderately dilated and fluid-filled. This continues to the level of a transition to more mildly dilated esophagus. Example transition site on coronal image 85. This is in the same vicinity of the stricture on the esophagram of 05/21/2011. Lungs/Pleura: No pleural fluid. Moderate right hemidiaphragm elevation. Right lower lobe adjacent volume loss and atelectasis. Right apical pleural-based pulmonary nodule of 4 mm on 21/4. Musculoskeletal: Mild T11 compression deformity with no ventral canal encroachment. CT ABDOMEN PELVIS FINDINGS Hepatobiliary: Normal liver. Cholecystectomy, without biliary ductal dilatation. Pancreas: Normal, without mass or ductal dilatation. Spleen: Normal in size, without focal abnormality. Adrenals/Urinary Tract: Normal adrenal glands. Mild renal cortical thinning bilaterally. Normal urinary bladder. Stomach/Bowel: Equivocal fold thickening involving the gastric body on 46/2. Periampullary duodenal diverticulum. Otherwise normal small bowel. Scattered colonic diverticula. Normal terminal ileum. Vascular/Lymphatic: Advanced aortic and branch vessel atherosclerosis. No abdominopelvic adenopathy. Reproductive: Normal uterus and adnexa. Other: No significant free fluid.  Moderate pelvic floor laxity. Musculoskeletal: Osteopenia. Disc bulge at the lumbosacral junction. IMPRESSION: 1. Moderate proximal esophageal dilatation with fluid level within. Transition in the lower esophagus to more mildly dilated suggesting either benign or malignant stricture. This is in the region of a stricture on 05/31/2011 esophagram. 2. Right thyroid nodule which should be considered for nonemergent ultrasound. This follows ACR consensus guidelines: Managing Incidental Thyroid Nodules Detected on Imaging: White Paper of  the ACR Incidental Thyroid Findings Committee. J Am Coll Radiol 2015; 12:143-150. 3. Coronary artery atherosclerosis. Aortic Atherosclerosis (ICD10-I70.0). 4.  Tiny hiatal hernia. 5. Equivocal gastric fold thickening as can be seen with gastritis. 6. Right apical pleural-based 4 mm nodule. No follow-up needed if patient is low-risk. Non-contrast chest CT can be considered in 12 months if patient is high-risk. This recommendation follows the consensus statement: Guidelines for Management of Incidental Pulmonary Nodules Detected on CT Images: From the Fleischner Society 2017; Radiology 2017; 284:228-243. Electronically Signed   By: Abigail Miyamoto M.D.   On: 01/15/2019 05:41    EKG: Independently reviewed. Sinus rhythm.   Assessment/Plan Active Problems:   Dysphagia   Dysphagia:  Possibly From esophageal stricture. CT abd and pelvis revealed Moderate proximal esophageal dilatation with fluid level within. Transition in the lower esophagus to more mildly dilated suggesting either benign or malignant stricture. Gastritis seen on CT.  Plan for EGD In am. She was started on IV PPI BID.  Appreciate GI recommendations.  NPO after midnight. Clear liquid diet for now.    Right thyroid nodule  Thyroid US ordered showed 2 right sided nodules and one left sided nodule.  Right side thyroid nodule will need Fine needle aspiration biopsy.  IR consulted to see if she can get the biopsy this admission.      Right apical pleural-based 4  mm nodule Recommend non contrast CT in 12 months for follow up .      Hypertension Sub optimally controlled.  Start the patient on prn hydralazine.    Diabetes mellitus:  CBG (last 3)  Recent Labs    01/15/19 0100  GLUCAP 111*   Resume SSI. Get A1c.    GERD: On PPI.   AKI; Possibly from dehydration and poor oral intake.  Hydrate and repeat renal parameters in am.  Check UA to rule out infection.    Dizziness; Possibly from dehydration.  Not associated  with blurry vision or any focal deficits.  Check orthostatic vital signs with PT.  If persistent, please check CT head without contrast.      Severity of Illness: The appropriate patient status for this patient is OBSERVATION. Observation status is judged to be reasonable and necessary in order to provide the required intensity of service to ensure the patient's safety. The patient's presenting symptoms, physical exam findings, and initial radiographic and laboratory data in the context of their medical condition is felt to place them at decreased risk for further clinical deterioration. Furthermore, it is anticipated that the patient will be medically stable for discharge from the hospital within 2 midnights of admission. The following factors support the patient status of observation.   " The patient's presenting symptoms include difficulty with oral intake. . " The physical exam findings include dizziness.  " The initial radiographic and laboratory data are esophageal stricture and AKI.     DVT prophylaxis:lovenox. Code Status: full code.  Family Communication: none at bedside.  Disposition Plan: pending further work up by GI.  Consults called: gastroenterology. Dr Collene Mares  Admission status: obs/med    Hosie Poisson MD [facility] Pager [phone]   If 7PM-7AM, please contact night-coverage [email or web address] Password [email or web address]  01/15/2019, 9:52 AM

## 2019-01-15 NOTE — Consult Note (Addendum)
GI Consultation Covering for Drs. Adriana Mccallum   Referring Provider: ER MD/Cardama Primary Care Physician:  Elenore Paddy, [place] Primary Gastroenterologist:  Dr. Meriel Pica  Reason for Consultation: Dysphagia, several day of poor intake,weight loss,weakness ,dizziness  HPI: Chloe Harding is a 73 y.o. female, known to Dr. Collene Mares, who presented to the emergency room last night with complaints of progressive weakness and dysphasia.  She says she has not been feeling well over the past month or so, and has had increased difficulty with p.o. intake.  Her weight is down from 1 70-1 57 by her report over the past 6 weeks.  She denies any heartburn or indigestion, because she stays on Prilosec 40 mg p.o. daily.  She says she has been having difficulty with both solid food and liquids sitting in her esophagus and has been having frequent episodes of regurgitation.  She admits to more difficulty with solids but says this is been occurring with liquids recently as well. And also has history of osteoarthritis, adult onset diabetes mellitus, hypertension, and chronic kidney disease. She had EGD and colonoscopy in January 2019 per Dr. Collene Mares done for GERD and iron deficiency anemia.  At EGD she was found to have a 3 to 4 cm cratered esophageal ulcer in the distal esophagus and a small ulcer at the GE junction as well as medium sized hiatal hernia, there was no stricture.  Biopsies returned showing ulcerated gastric type mucosa with goblet cell metaplasia consistent with Barrett's. Colonoscopy with finding of a 3 mm polyp and diverticulosis.  Patient thinks she may have had EGD remotely with esophageal dilation.   Labs today WBC of 5.3, hemoglobin 11.9, creatinine 1.42.  CT of the chest and abdomen and pelvis have been done without contrast today and show moderate proximal esophageal dilation with a fluid level in the lower esophagus possibly representing stricture, there is a right thyroid nodule and a right  apical 4 mm pleural-based nodule as well as a tiny hiatal hernia..  Patient is currently comfortable and does not feel that she has food stuck in her esophagus currently.  She has been swallowing her saliva fine, has no complaints of chest discomfort and was sleeping soundly when I went in to examine her.     Past Medical History:  Diagnosis Date  . Arthritis   . Depression   . Diabetes mellitus   . Hiatal hernia   . Hypercholesteremia   . Hypertension   . Reflux   . Renal disorder    shutting down 4 years ago    Past Surgical History:  Procedure Laterality Date  . CHOLECYSTECTOMY    . COLONOSCOPY WITH PROPOFOL N/A 05/20/2017   Procedure: COLONOSCOPY WITH PROPOFOL;  Surgeon: Carol Ada, MD;  Location: [facility];  Service: Endoscopy;  Laterality: N/A;  . ESOPHAGOGASTRODUODENOSCOPY N/A 05/19/2017   Procedure: ESOPHAGOGASTRODUODENOSCOPY (EGD);  Surgeon: Juanita Craver, MD;  Location: [facility] ENDOSCOPY;  Service: Endoscopy;  Laterality: N/A;    Prior to Admission medications   Medication Sig Start Date End Date Taking? Authorizing Provider  amitriptyline (ELAVIL) 25 MG tablet Take 25 mg by mouth at bedtime.   Yes [provider]  folic acid (FOLVITE) 1 MG tablet Take 1 mg by mouth daily.   Yes [provider]  gabapentin (NEURONTIN) 300 MG capsule Take 300 mg by mouth at bedtime.   Yes [provider]  HYDROcodone-acetaminophen (NORCO/VICODIN) 5-325 MG tablet Take 1 tablet by mouth every 6 (six) hours as needed for moderate  pain.   Yes [provider]  lisinopril (PRINIVIL,ZESTRIL) 10 MG tablet Take 10 mg by mouth daily.   Yes [provider]  meloxicam (MOBIC) 15 MG tablet Take 15 mg by mouth daily.   Yes [provider]  omeprazole (PRILOSEC) 40 MG capsule Take 40 mg by mouth daily.   Yes [provider]  rOPINIRole (REQUIP) 4 MG tablet Take 4 mg by mouth at bedtime.   Yes [provider]  gabapentin (NEURONTIN)  600 MG tablet Take 1 tablet (600 mg total) by mouth 2 (two) times daily. Patient not taking: Reported on 01/15/2019 05/21/17   Regalado, Jerald Kief A, MD  pantoprazole (PROTONIX) 40 MG tablet Take 1 tablet (40 mg total) by mouth 2 (two) times daily. Patient not taking: Reported on 01/15/2019 05/21/17 05/21/18  Elmarie Shiley, MD    Current Facility-Administered Medications  Medication Dose Route Frequency Provider Last Rate Last Dose  . 0.9 %  sodium chloride infusion  1,000 mL Intravenous Continuous Cardama, Grayce Sessions, MD 125 mL/hr at 01/15/19 0124 1,000 mL at 01/15/19 0124  . iohexol (OMNIPAQUE) 300 MG/ML solution 0.1 mL  30 mg Oral Once PRN Cardama, Grayce Sessions, MD       Current Outpatient Medications  Medication Sig Dispense Refill  . amitriptyline (ELAVIL) 25 MG tablet Take 25 mg by mouth at bedtime.    . folic acid (FOLVITE) 1 MG tablet Take 1 mg by mouth daily.    Marland Kitchen gabapentin (NEURONTIN) 300 MG capsule Take 300 mg by mouth at bedtime.    Marland Kitchen HYDROcodone-acetaminophen (NORCO/VICODIN) 5-325 MG tablet Take 1 tablet by mouth every 6 (six) hours as needed for moderate pain.    Marland Kitchen lisinopril (PRINIVIL,ZESTRIL) 10 MG tablet Take 10 mg by mouth daily.    . meloxicam (MOBIC) 15 MG tablet Take 15 mg by mouth daily.    Marland Kitchen omeprazole (PRILOSEC) 40 MG capsule Take 40 mg by mouth daily.    Marland Kitchen rOPINIRole (REQUIP) 4 MG tablet Take 4 mg by mouth at bedtime.    . gabapentin (NEURONTIN) 600 MG tablet Take 1 tablet (600 mg total) by mouth 2 (two) times daily. (Patient not taking: Reported on 01/15/2019) 10 tablet 0  . pantoprazole (PROTONIX) 40 MG tablet Take 1 tablet (40 mg total) by mouth 2 (two) times daily. (Patient not taking: Reported on 01/15/2019) 30 tablet 1    Allergies as of 01/14/2019 - Review Complete 10/02/2017  Allergen Reaction Noted  . Amlodipine besylate Swelling 12/26/2009    No family history on file.  Social History   Socioeconomic History  . Marital status: Married    Spouse  name: Not on file  . Number of children: Not on file  . Years of education: Not on file  . Highest education level: Not on file  Occupational History  . Not on file  Social Needs  . Financial resource strain: Not on file  . Food insecurity    Worry: Not on file    Inability: Not on file  . Transportation needs    Medical: Not on file    Non-medical: Not on file  Tobacco Use  . Smoking status: Never Smoker  . Smokeless tobacco: Never Used  Substance and Sexual Activity  . Alcohol use: No  . Drug use: No  . Sexual activity: Not Currently  Lifestyle  . Physical activity    Days per week: Not on file    Minutes per session: Not on file  . Stress: Not  on file  Relationships  . Social Herbalist on phone: Not on file    Gets together: Not on file    Attends religious service: Not on file    Active member of club or organization: Not on file    Attends meetings of clubs or organizations: Not on file    Relationship status: Not on file  . Intimate partner violence    Fear of current or ex partner: Not on file    Emotionally abused: Not on file    Physically abused: Not on file    Forced sexual activity: Not on file  Other Topics Concern  . Not on file  Social History Narrative  . Not on file    Review of Systems: Pertinent positive and negative review of systems were noted in the above HPI section.  All other review of systems was otherwise negative. Physical Exam: Vital signs in last 24 hours: Temp:  [97.8 F (36.6 C)-98 F (36.7 C)] 97.8 F (36.6 C) (08/30 0730) Pulse Rate:  [82-85] 85 (08/30 0730) Resp:  [13-22] 22 (08/30 0730) BP: (135-170)/(71-87) 170/87 (08/30 0730) SpO2:  [98 %-100 %] 100 % (08/30 0730) Weight:  [71.2 kg] 71.2 kg (08/30 0025)   General:   Alert,  Well-developed, elderly well-nourished, pleasant and cooperative in NAD  Head:  Normocephalic and atraumatic. Eyes:  Sclera clear, no icterus.   Conjunctiva pink. Ears:  Normal auditory  acuity. Nose:  No deformity, discharge,  or lesions. Mouth:  No deformity or lesions.   Neck:  Supple; no masses or thyromegaly. Lungs:  Clear throughout to auscultation.   No wheezes, crackles, or rhonchi. Heart:  Regular rate and rhythm; no murmurs, clicks, rubs,  or gallops. Abdomen:  Soft obese,,nontender, BS active,nonpalp mass or hsm.   Rectal:  Deferred  Msk:  Symmetrical without gross deformities. . Pulses:  Normal pulses noted. Extremities:  Without clubbing or edema. Neurologic:  Alert and  oriented x4;  grossly normal neurologically. Skin:  Intact without significant lesions or rashes.. Psych:  Alert and cooperative. Normal mood and affect.  Intake/Output from previous day: 08/29 0701 - 08/30 0700 In: 1000 [IV Piggyback:1000] Out: -  Intake/Output this shift: No intake/output data recorded.  Lab Results: Recent Labs    01/15/19 0126  WBC 5.3  HGB 11.9*  HCT 37.2  PLT 199   BMET Recent Labs    01/15/19 0126  NA 144  K 3.5  CL 111  CO2 25  GLUCOSE 129*  BUN 19  CREATININE 1.42*  CALCIUM 9.1   LFT Recent Labs    01/15/19 0126  PROT 7.2  ALBUMIN 3.7  AST 24  ALT 12  ALKPHOS 77  BILITOT 0.7    IMPRESSION:  #11  73 year old white female with 4 to 6 week history of dysphagia primarily to solids but also to liquids requiring frequent episodes of regurgitation.  Patient has had a 13 pound weight loss, and complaints of generalized weakness and fatigue.  She has history of previously documented Barrett's esophagus in 2019 she had a large 3 to 4 cm cratered esophageal ulcer at EGD.  CT done today noncontrasted of the chest shows a moderate dilation of the proximal esophagus with fluid level in the lower esophagus.  Rule out distal esophageal stricture, rule out malignancy.  Less likely motility disorder but consider with complaint of liquid dysphagia.  #2 acute kidney injury on chronic kidney disease #3 hypertension #4 diverticulosis #5.  Status post  cholecystectomy #6 history of colon polyps  Plan:  Clear liquid diet today, keep head of bed elevated 60 degrees. N.p.o. after midnight Start Protonix 40 mg IV twice daily Volume replete Patient will be scheduled for EGD with possible esophageal dilation and/or biopsies with Dr. Mann/Dr. Benson Norway tomorrow.  Procedure was discussed in detail with the patient including indications risks and benefits and she is agreeable to proceed.   Amy EsterwoodPA-C  01/15/2019, 8:38 AM     Attending Physician Note   I have taken a history, examined the patient and reviewed the chart. I agree with the Advanced Practitioner's note, impression and recommendations. Worsening dysphagia to solids and liquids and a 13 pound weight loss over several weeks. History of Barrett's with an esophageal ulcer in 2019. PPI bid. NPO today. IV fluids. EGD with Drs. Mann or [employer].   Lucio Edward, MD [facility]astroenter[facility]

## 2019-01-15 NOTE — ED Notes (Signed)
ED TO INPATIENT HANDOFF REPORT  Name/Age/Gender Chloe Harding 73 y.o. female  Code Status Code Status History    Date Active Date Inactive Code Status Order ID Comments User Context   05/18/2017 1148 05/21/2017 1513 Full Code [id number]  Samella Parr, NP ED   03/30/2015 1725 04/02/2015 1802 Full Code [id number]  Lavina Hamman, MD ED   Advance Care Planning Activity      Home/SNF/Other Home  Chief Complaint Dizziness x 1 week  Level of Care/Admitting Diagnosis ED Disposition    ED Disposition Condition [facility] Area: [place] [id number]  Level of Care: Med-Surg [16]  Covid Evaluation: Asymptomatic Screening Protocol (No Symptoms)  Diagnosis: Dysphagia [id number]  Admitting Physician: Hosie Poisson [[id number]]  Attending Physician: Hosie Poisson [[id number]]  PT Class (Do Not Modify): Observation [104]  PT Acc Code (Do Not Modify): Observation [10022]       Medical History Past Medical History:  Diagnosis Date  . Arthritis   . Depression   . Diabetes mellitus   . Hiatal hernia   . Hypercholesteremia   . Hypertension   . Reflux   . Renal disorder    shutting down 4 years ago    Allergies Allergies  Allergen Reactions  . Amlodipine Besylate Swelling    swelling    IV Location/Drains/Wounds Patient Lines/Drains/Airways Status   Active Line/Drains/Airways    Name:   Placement date:   Placement time:   Site:   Days:   Peripheral IV 01/15/19 Left Antecubital   01/15/19    -    Antecubital   less than 1          Labs/Imaging Results for orders placed or performed during the hospital encounter of 01/15/19 (from the past 48 hour(s))  POC CBG, ED     Status: Abnormal   Collection Time: 01/15/19  1:00 AM  Result Value Ref Range   Glucose-Capillary 111 (H) 70 - 99 mg/dL  CBC with Differential/Platelet     Status: Abnormal   Collection Time: 01/15/19  1:26 AM  Result Value Ref Range   WBC 5.3 4.0 - 10.5 K/uL   RBC 3.95 3.87 -  5.11 MIL/uL   Hemoglobin 11.9 (L) 12.0 - 15.0 g/dL   HCT 37.2 36.0 - 46.0 %   MCV 94.2 80.0 - 100.0 fL   MCH 30.1 26.0 - 34.0 pg   MCHC 32.0 30.0 - 36.0 g/dL   RDW 12.9 11.5 - 15.5 %   Platelets 199 150 - 400 K/uL   nRBC 0.0 0.0 - 0.2 %   Neutrophils Relative % 54 %   Neutro Abs 2.8 1.7 - 7.7 K/uL   Lymphocytes Relative 37 %   Lymphs Abs 2.0 0.7 - 4.0 K/uL   Monocytes Relative 6 %   Monocytes Absolute 0.3 0.1 - 1.0 K/uL   Eosinophils Relative 3 %   Eosinophils Absolute 0.1 0.0 - 0.5 K/uL   Basophils Relative 0 %   Basophils Absolute 0.0 0.0 - 0.1 K/uL   Immature Granulocytes 0 %   Abs Immature Granulocytes 0.01 0.00 - 0.07 K/uL    Comment: Performed at [facility], [place] [address], [place], [place] [address]  Comprehensive metabolic panel     Status: Abnormal   Collection Time: 01/15/19  1:26 AM  Result Value Ref Range   Sodium 144 135 - 145 mmol/L   Potassium 3.5 3.5 - 5.1 mmol/L   Chloride 111 98 - 111  mmol/L   CO2 25 22 - 32 mmol/L   Glucose, Bld 129 (H) 70 - 99 mg/dL   BUN 19 8 - 23 mg/dL   Creatinine, Ser 1.42 (H) 0.44 - 1.00 mg/dL   Calcium 9.1 8.9 - 10.3 mg/dL   Total Protein 7.2 6.5 - 8.1 g/dL   Albumin 3.7 3.5 - 5.0 g/dL   AST 24 15 - 41 U/L   ALT 12 0 - 44 U/L   Alkaline Phosphatase 77 38 - 126 U/L   Total Bilirubin 0.7 0.3 - 1.2 mg/dL   GFR calc non Af Amer 37 (L) >60 mL/min   GFR calc Af Amer 43 (L) >60 mL/min   Anion gap 8 5 - 15    Comment: Performed at [facility], [place] [address], [place], [place] [address]   Ct Abdomen Pelvis Wo Contrast  Result Date: 01/15/2019 CLINICAL DATA:  Dysphagia. Mild abdominal pain. Possible esophageal stricture. EXAM: CT CHEST, ABDOMEN AND PELVIS WITHOUT CONTRAST TECHNIQUE: Multidetector CT imaging of the chest, abdomen and pelvis was performed following the standard protocol without IV contrast. COMPARISON:  Chest radiograph 10/02/2017. Esophagram of 05/21/2011. Prior abdominopelvic CT  12/12/2009 FINDINGS: CT CHEST FINDINGS Cardiovascular: Aortic and branch vessel atherosclerosis. Tortuous thoracic aorta. Mild cardiomegaly, without pericardial effusion. Multivessel coronary artery atherosclerosis. Mediastinum/Nodes: Right-sided thyroid nodule of 2.2 cm on 01/02. No mediastinal or definite hilar adenopathy, given limitations of unenhanced CT. Small hiatal hernia. The esophagus is moderately dilated and fluid-filled. This continues to the level of a transition to more mildly dilated esophagus. Example transition site on coronal image 85. This is in the same vicinity of the stricture on the esophagram of 05/21/2011. Lungs/Pleura: No pleural fluid. Moderate right hemidiaphragm elevation. Right lower lobe adjacent volume loss and atelectasis. Right apical pleural-based pulmonary nodule of 4 mm on 21/4. Musculoskeletal: Mild T11 compression deformity with no ventral canal encroachment. CT ABDOMEN PELVIS FINDINGS Hepatobiliary: Normal liver. Cholecystectomy, without biliary ductal dilatation. Pancreas: Normal, without mass or ductal dilatation. Spleen: Normal in size, without focal abnormality. Adrenals/Urinary Tract: Normal adrenal glands. Mild renal cortical thinning bilaterally. Normal urinary bladder. Stomach/Bowel: Equivocal fold thickening involving the gastric body on 46/2. Periampullary duodenal diverticulum. Otherwise normal small bowel. Scattered colonic diverticula. Normal terminal ileum. Vascular/Lymphatic: Advanced aortic and branch vessel atherosclerosis. No abdominopelvic adenopathy. Reproductive: Normal uterus and adnexa. Other: No significant free fluid.  Moderate pelvic floor laxity. Musculoskeletal: Osteopenia. Disc bulge at the lumbosacral junction. IMPRESSION: 1. Moderate proximal esophageal dilatation with fluid level within. Transition in the lower esophagus to more mildly dilated suggesting either benign or malignant stricture. This is in the region of a stricture on 05/31/2011  esophagram. 2. Right thyroid nodule which should be considered for nonemergent ultrasound. This follows ACR consensus guidelines: Managing Incidental Thyroid Nodules Detected on Imaging: White Paper of the ACR Incidental Thyroid Findings Committee. J Am Coll Radiol 2015; 12:143-150. 3. Coronary artery atherosclerosis. Aortic Atherosclerosis (ICD10-I70.0). 4.  Tiny hiatal hernia. 5. Equivocal gastric fold thickening as can be seen with gastritis. 6. Right apical pleural-based 4 mm nodule. No follow-up needed if patient is low-risk. Non-contrast chest CT can be considered in 12 months if patient is high-risk. This recommendation follows the consensus statement: Guidelines for Management of Incidental Pulmonary Nodules Detected on CT Images: From the Fleischner Society 2017; Radiology 2017; 284:228-243. Electronically Signed   By: Abigail Miyamoto M.D.   On: 01/15/2019 05:41   Ct Chest Wo Contrast  Result Date: 01/15/2019 CLINICAL DATA:  Dysphagia. Mild abdominal  pain. Possible esophageal stricture. EXAM: CT CHEST, ABDOMEN AND PELVIS WITHOUT CONTRAST TECHNIQUE: Multidetector CT imaging of the chest, abdomen and pelvis was performed following the standard protocol without IV contrast. COMPARISON:  Chest radiograph 10/02/2017. Esophagram of 05/21/2011. Prior abdominopelvic CT 12/12/2009 FINDINGS: CT CHEST FINDINGS Cardiovascular: Aortic and branch vessel atherosclerosis. Tortuous thoracic aorta. Mild cardiomegaly, without pericardial effusion. Multivessel coronary artery atherosclerosis. Mediastinum/Nodes: Right-sided thyroid nodule of 2.2 cm on 01/02. No mediastinal or definite hilar adenopathy, given limitations of unenhanced CT. Small hiatal hernia. The esophagus is moderately dilated and fluid-filled. This continues to the level of a transition to more mildly dilated esophagus. Example transition site on coronal image 85. This is in the same vicinity of the stricture on the esophagram of 05/21/2011. Lungs/Pleura: No  pleural fluid. Moderate right hemidiaphragm elevation. Right lower lobe adjacent volume loss and atelectasis. Right apical pleural-based pulmonary nodule of 4 mm on 21/4. Musculoskeletal: Mild T11 compression deformity with no ventral canal encroachment. CT ABDOMEN PELVIS FINDINGS Hepatobiliary: Normal liver. Cholecystectomy, without biliary ductal dilatation. Pancreas: Normal, without mass or ductal dilatation. Spleen: Normal in size, without focal abnormality. Adrenals/Urinary Tract: Normal adrenal glands. Mild renal cortical thinning bilaterally. Normal urinary bladder. Stomach/Bowel: Equivocal fold thickening involving the gastric body on 46/2. Periampullary duodenal diverticulum. Otherwise normal small bowel. Scattered colonic diverticula. Normal terminal ileum. Vascular/Lymphatic: Advanced aortic and branch vessel atherosclerosis. No abdominopelvic adenopathy. Reproductive: Normal uterus and adnexa. Other: No significant free fluid.  Moderate pelvic floor laxity. Musculoskeletal: Osteopenia. Disc bulge at the lumbosacral junction. IMPRESSION: 1. Moderate proximal esophageal dilatation with fluid level within. Transition in the lower esophagus to more mildly dilated suggesting either benign or malignant stricture. This is in the region of a stricture on 05/31/2011 esophagram. 2. Right thyroid nodule which should be considered for nonemergent ultrasound. This follows ACR consensus guidelines: Managing Incidental Thyroid Nodules Detected on Imaging: White Paper of the ACR Incidental Thyroid Findings Committee. J Am Coll Radiol 2015; 12:143-150. 3. Coronary artery atherosclerosis. Aortic Atherosclerosis (ICD10-I70.0). 4.  Tiny hiatal hernia. 5. Equivocal gastric fold thickening as can be seen with gastritis. 6. Right apical pleural-based 4 mm nodule. No follow-up needed if patient is low-risk. Non-contrast chest CT can be considered in 12 months if patient is high-risk. This recommendation follows the consensus  statement: Guidelines for Management of Incidental Pulmonary Nodules Detected on CT Images: From the Fleischner Society 2017; Radiology 2017; 284:228-243. Electronically Signed   By: Abigail Miyamoto M.D.   On: 01/15/2019 05:41    Pending Labs Unresulted Labs (From admission, onward)   None      Vitals/Pain Today's Vitals   01/15/19 0400 01/15/19 0430 01/15/19 0730 01/15/19 0830  BP: (!) 165/78 (!) 145/80 (!) 170/87 130/66  Pulse: 84 84 85 80  Resp: 14 13 (!) 22 14  Temp:   97.8 F (36.6 C)   TempSrc:   Oral   SpO2: 100% 99% 100% 99%  Weight:      Height:      PainSc:        Isolation Precautions No active isolations  Medications Medications  sodium chloride 0.9 % bolus 1,000 mL (0 mLs Intravenous Stopped 01/15/19 0151)    Followed by  0.9 %  sodium chloride infusion (1,000 mLs Intravenous New Bag/Given 01/15/19 0124)  iohexol (OMNIPAQUE) 300 MG/ML solution 0.1 mL (has no administration in time range)    Mobility walks

## 2019-01-15 NOTE — Progress Notes (Signed)
md notified about low blood sugars in 70's.

## 2019-01-15 NOTE — ED Provider Notes (Signed)
[facility] DEPT Provider Note  CSN: [id number] Arrival date & time: 01/14/19 2359  Chief Complaint(s) Dizziness (pt reports dizziness and near syncope; hx of hypotension and esophogeal stretching and pt feels that "its closing up again" over the last month and pt reports poor intake d/t this.)  HPI Chloe Harding is a 73 y.o. female with a history of esophageal stricture presents to the emergency department with several days of oral intolerance similar to prior strictures.  Patient reports that she is unable to keep most of her food down.  She is able to take small bites of certain foods but this is becoming more difficult.  She endorses associated generalized fatigue and lightheadedness.  No chest pain or shortness of breath.  She endorses epigastric abdominal discomfort.  No diarrhea.  Still having bowel movements.  No urinary symptoms.  Denies any other physical complaints.  HPI  Past Medical History Past Medical History:  Diagnosis Date   Arthritis    Depression    Diabetes mellitus    Hiatal hernia    Hypercholesteremia    Hypertension    Reflux    Renal disorder    shutting down 4 years ago   Patient Active Problem List   Diagnosis Date Noted   Symptomatic anemia 05/18/2017   Acute GI bleeding 05/18/2017   Acute-on-chronic kidney injury ([place]) 05/18/2017   Hypertension 05/18/2017   Hiatal hernia 05/18/2017   History of esophageal stricture 05/18/2017   Hypercholesteremia 05/18/2017   Diabetes mellitus 05/18/2017   Solitary thyroid nodule 05/18/2017   Hypotension 03/30/2015   Sepsis ([place]) 03/30/2015   CAP (community acquired pneumonia) 03/30/2015   Acute encephalopathy 03/30/2015   Hyperkalemia 03/30/2015   Acute kidney injury ([place]) 03/30/2015   Nausea with vomiting 03/30/2015   Diabetic neuropathy ([place]) 03/30/2015   OBESITY 03/11/2010   Type 2 diabetes mellitus ([place]) 12/26/2009   Dyslipidemia 12/26/2009    ANEMIA 12/26/2009   DEPRESSION 12/26/2009   HYPERTENSION, BENIGN ESSENTIAL 12/26/2009   GERD 12/26/2009   HIATAL HERNIA 12/26/2009   CHRONIC KIDNEY DISEASE STAGE I 12/26/2009   UTI 12/26/2009   ARTHRITIS 12/26/2009   ARTHROSCOPY, RIGHT KNEE, HX OF 12/26/2009   Home Medication(s) Prior to Admission medications   Medication Sig Start Date End Date Taking? Authorizing Provider  amitriptyline (ELAVIL) 25 MG tablet Take 25 mg by mouth at bedtime.   Yes [provider]  folic acid (FOLVITE) 1 MG tablet Take 1 mg by mouth daily.   Yes [provider]  gabapentin (NEURONTIN) 300 MG capsule Take 300 mg by mouth at bedtime.   Yes [provider]  HYDROcodone-acetaminophen (NORCO/VICODIN) 5-325 MG tablet Take 1 tablet by mouth every 6 (six) hours as needed for moderate pain.   Yes [provider]  lisinopril (PRINIVIL,ZESTRIL) 10 MG tablet Take 10 mg by mouth daily.   Yes [provider]  meloxicam (MOBIC) 15 MG tablet Take 15 mg by mouth daily.   Yes [provider]  omeprazole (PRILOSEC) 40 MG capsule Take 40 mg by mouth daily.   Yes [provider]  rOPINIRole (REQUIP) 4 MG tablet Take 4 mg by mouth at bedtime.   Yes [provider]  gabapentin (NEURONTIN) 600 MG tablet Take 1 tablet (600 mg total) by mouth 2 (two) times daily. Patient not taking: Reported on 01/15/2019 05/21/17   Regalado, Jerald Kief A, MD  pantoprazole (PROTONIX) 40 MG tablet Take 1 tablet (40 mg total) by mouth 2 (two) times daily. Patient  not taking: Reported on 01/15/2019 05/21/17 05/21/18  Elmarie Shiley, MD                                                                                                                                    Past Surgical History Past Surgical History:  Procedure Laterality Date   CHOLECYSTECTOMY     COLONOSCOPY WITH PROPOFOL N/A 05/20/2017   Procedure: COLONOSCOPY WITH PROPOFOL;  Surgeon: Carol Ada, MD;  Location: [facility];  Service: Endoscopy;  Laterality: N/A;   ESOPHAGOGASTRODUODENOSCOPY N/A 05/19/2017   Procedure: ESOPHAGOGASTRODUODENOSCOPY (EGD);  Surgeon: Juanita Craver, MD;  Location: [facility];  Service: Endoscopy;  Laterality: N/A;   Family History No family history on file.  Social History Social History   Tobacco Use   Smoking status: Never Smoker   Smokeless tobacco: Never Used  Substance Use Topics   Alcohol use: No   Drug use: No   Allergies Amlodipine besylate  Review of Systems Review of Systems All other systems are reviewed and are negative for acute change except as noted in the HPI  Physical Exam Vital Signs  I have reviewed the triage vital signs BP 135/79 (BP Location: Right Arm)    Pulse 82    Temp 98 F (36.7 C) (Oral)    Resp 18    Ht 5\' 1"  (1.549 m)    Wt 71.2 kg    SpO2 100%    BMI 29.66 kg/m   Physical Exam Vitals signs reviewed.  Constitutional:      General: She is not in acute distress.    Appearance: She is well-developed. She is not diaphoretic.  HENT:     Head: Normocephalic and atraumatic.     Right Ear: External ear normal.     Left Ear: External ear normal.     Nose: Nose normal.  Eyes:     General: No scleral icterus.    Conjunctiva/sclera: Conjunctivae normal.  Neck:     Musculoskeletal: Normal range of motion.     Trachea: Phonation normal.  Cardiovascular:     Rate and Rhythm: Normal rate and regular rhythm.  Pulmonary:     Effort: Pulmonary effort is normal. No respiratory distress.     Breath sounds: No stridor.  Abdominal:     General: There is no distension.     Tenderness: There is generalized abdominal tenderness (mild discomfort). There is no guarding or rebound.  Musculoskeletal: Normal range of motion.  Neurological:     Mental Status: She is alert and oriented to person, place, and time.  Psychiatric:        Behavior: Behavior normal.     ED Results and Treatments Labs (all labs ordered are listed, but only  abnormal results are displayed) Labs Reviewed  CBC WITH DIFFERENTIAL/PLATELET - Abnormal; Notable for the following components:      Result Value   Hemoglobin 11.9 (*)    All  other components within normal limits  COMPREHENSIVE METABOLIC PANEL - Abnormal; Notable for the following components:   Glucose, Bld 129 (*)    Creatinine, Ser 1.42 (*)    GFR calc non Af Amer 37 (*)    GFR calc Af Amer 43 (*)    All other components within normal limits  CBG MONITORING, ED - Abnormal; Notable for the following components:   Glucose-Capillary 111 (*)    All other components within normal limits                                                                                                                         EKG  EKG Interpretation  Date/Time:  Sunday January 15 2019 00:40:49 EDT Ventricular Rate:  78 PR Interval:    QRS Duration: 94 QT Interval:  404 QTC Calculation: 461 R Axis:   -6 Text Interpretation:  Sinus rhythm No significant change since last tracing Confirmed by Addison Lank [id number]) on 01/15/2019 3:11:29 AM      Radiology Ct Abdomen Pelvis Wo Contrast  Result Date: 01/15/2019 CLINICAL DATA:  Dysphagia. Mild abdominal pain. Possible esophageal stricture. EXAM: CT CHEST, ABDOMEN AND PELVIS WITHOUT CONTRAST TECHNIQUE: Multidetector CT imaging of the chest, abdomen and pelvis was performed following the standard protocol without IV contrast. COMPARISON:  Chest radiograph 10/02/2017. Esophagram of 05/21/2011. Prior abdominopelvic CT 12/12/2009 FINDINGS: CT CHEST FINDINGS Cardiovascular: Aortic and branch vessel atherosclerosis. Tortuous thoracic aorta. Mild cardiomegaly, without pericardial effusion. Multivessel coronary artery atherosclerosis. Mediastinum/Nodes: Right-sided thyroid nodule of 2.2 cm on 01/02. No mediastinal or definite hilar adenopathy, given limitations of unenhanced CT. Small hiatal hernia. The esophagus is moderately dilated and fluid-filled. This continues to the  level of a transition to more mildly dilated esophagus. Example transition site on coronal image 85. This is in the same vicinity of the stricture on the esophagram of 05/21/2011. Lungs/Pleura: No pleural fluid. Moderate right hemidiaphragm elevation. Right lower lobe adjacent volume loss and atelectasis. Right apical pleural-based pulmonary nodule of 4 mm on 21/4. Musculoskeletal: Mild T11 compression deformity with no ventral canal encroachment. CT ABDOMEN PELVIS FINDINGS Hepatobiliary: Normal liver. Cholecystectomy, without biliary ductal dilatation. Pancreas: Normal, without mass or ductal dilatation. Spleen: Normal in size, without focal abnormality. Adrenals/Urinary Tract: Normal adrenal glands. Mild renal cortical thinning bilaterally. Normal urinary bladder. Stomach/Bowel: Equivocal fold thickening involving the gastric body on 46/2. Periampullary duodenal diverticulum. Otherwise normal small bowel. Scattered colonic diverticula. Normal terminal ileum. Vascular/Lymphatic: Advanced aortic and branch vessel atherosclerosis. No abdominopelvic adenopathy. Reproductive: Normal uterus and adnexa. Other: No significant free fluid.  Moderate pelvic floor laxity. Musculoskeletal: Osteopenia. Disc bulge at the lumbosacral junction. IMPRESSION: 1. Moderate proximal esophageal dilatation with fluid level within. Transition in the lower esophagus to more mildly dilated suggesting either benign or malignant stricture. This is in the region of a stricture on 05/31/2011 esophagram. 2. Right thyroid nodule which should be considered for nonemergent ultrasound. This follows ACR consensus guidelines: Managing Incidental Thyroid Nodules Detected on Imaging:  White Paper of the ACR Incidental Thyroid Findings Committee. J Am Coll Radiol 2015; 12:143-150. 3. Coronary artery atherosclerosis. Aortic Atherosclerosis (ICD10-I70.0). 4.  Tiny hiatal hernia. 5. Equivocal gastric fold thickening as can be seen with gastritis. 6. Right  apical pleural-based 4 mm nodule. No follow-up needed if patient is low-risk. Non-contrast chest CT can be considered in 12 months if patient is high-risk. This recommendation follows the consensus statement: Guidelines for Management of Incidental Pulmonary Nodules Detected on CT Images: From the Fleischner Society 2017; Radiology 2017; 284:228-243. Electronically Signed   By: Abigail Miyamoto M.D.   On: 01/15/2019 05:41   Ct Chest Wo Contrast  Result Date: 01/15/2019 CLINICAL DATA:  Dysphagia. Mild abdominal pain. Possible esophageal stricture. EXAM: CT CHEST, ABDOMEN AND PELVIS WITHOUT CONTRAST TECHNIQUE: Multidetector CT imaging of the chest, abdomen and pelvis was performed following the standard protocol without IV contrast. COMPARISON:  Chest radiograph 10/02/2017. Esophagram of 05/21/2011. Prior abdominopelvic CT 12/12/2009 FINDINGS: CT CHEST FINDINGS Cardiovascular: Aortic and branch vessel atherosclerosis. Tortuous thoracic aorta. Mild cardiomegaly, without pericardial effusion. Multivessel coronary artery atherosclerosis. Mediastinum/Nodes: Right-sided thyroid nodule of 2.2 cm on 01/02. No mediastinal or definite hilar adenopathy, given limitations of unenhanced CT. Small hiatal hernia. The esophagus is moderately dilated and fluid-filled. This continues to the level of a transition to more mildly dilated esophagus. Example transition site on coronal image 85. This is in the same vicinity of the stricture on the esophagram of 05/21/2011. Lungs/Pleura: No pleural fluid. Moderate right hemidiaphragm elevation. Right lower lobe adjacent volume loss and atelectasis. Right apical pleural-based pulmonary nodule of 4 mm on 21/4. Musculoskeletal: Mild T11 compression deformity with no ventral canal encroachment. CT ABDOMEN PELVIS FINDINGS Hepatobiliary: Normal liver. Cholecystectomy, without biliary ductal dilatation. Pancreas: Normal, without mass or ductal dilatation. Spleen: Normal in size, without focal  abnormality. Adrenals/Urinary Tract: Normal adrenal glands. Mild renal cortical thinning bilaterally. Normal urinary bladder. Stomach/Bowel: Equivocal fold thickening involving the gastric body on 46/2. Periampullary duodenal diverticulum. Otherwise normal small bowel. Scattered colonic diverticula. Normal terminal ileum. Vascular/Lymphatic: Advanced aortic and branch vessel atherosclerosis. No abdominopelvic adenopathy. Reproductive: Normal uterus and adnexa. Other: No significant free fluid.  Moderate pelvic floor laxity. Musculoskeletal: Osteopenia. Disc bulge at the lumbosacral junction. IMPRESSION: 1. Moderate proximal esophageal dilatation with fluid level within. Transition in the lower esophagus to more mildly dilated suggesting either benign or malignant stricture. This is in the region of a stricture on 05/31/2011 esophagram. 2. Right thyroid nodule which should be considered for nonemergent ultrasound. This follows ACR consensus guidelines: Managing Incidental Thyroid Nodules Detected on Imaging: White Paper of the ACR Incidental Thyroid Findings Committee. J Am Coll Radiol 2015; 12:143-150. 3. Coronary artery atherosclerosis. Aortic Atherosclerosis (ICD10-I70.0). 4.  Tiny hiatal hernia. 5. Equivocal gastric fold thickening as can be seen with gastritis. 6. Right apical pleural-based 4 mm nodule. No follow-up needed if patient is low-risk. Non-contrast chest CT can be considered in 12 months if patient is high-risk. This recommendation follows the consensus statement: Guidelines for Management of Incidental Pulmonary Nodules Detected on CT Images: From the Fleischner Society 2017; Radiology 2017; 284:228-243. Electronically Signed   By: Abigail Miyamoto M.D.   On: 01/15/2019 05:41    Pertinent labs & imaging results that were available during my care of the patient were reviewed by me and considered in my medical decision making (see chart for details).  Medications Ordered in ED Medications  sodium  chloride 0.9 % bolus 1,000 mL (0 mLs Intravenous Stopped 01/15/19 0151)  Followed by  0.9 %  sodium chloride infusion (1,000 mLs Intravenous New Bag/Given 01/15/19 0124)                                                                                                                                    Procedures Procedures  (including critical care time)  Medical Decision Making / ED Course I have reviewed the nursing notes for this encounter and the patient's prior records (if available in EHR or on provided paperwork).   Chloe Harding was evaluated in Emergency Department on 01/15/2019 for the symptoms described in the history of present illness. She was evaluated in the context of the global COVID-19 pandemic, which necessitated consideration that the patient might be at risk for infection with the SARS-CoV-2 virus that causes COVID-19. Institutional protocols and algorithms that pertain to the evaluation of patients at risk for COVID-19 are in a state of rapid change based on information released by regulatory bodies including the CDC and federal and state organizations. These policies and algorithms were followed during the patient's care in the ED.  Inability to tolerate oral intake.  Work-up notable for evidence of distal esophageal stricture.  AKI noted on labs.  Given IV fluids.  Spoke with Dr. Fuller Plan for gastroenterology, who will see him patient in consult.  Will admit patient to medicine.       Final Clinical Impression(s) / ED Diagnoses Final diagnoses:  Dysphagia  Esophageal stricture  AKI (acute kidney injury) ([place])      This chart was dictated using voice recognition software.  Despite best efforts to proofread,  errors can occur which can change the documentation meaning.   Fatima Blank, MD 01/15/19 [phone]

## 2019-01-16 ENCOUNTER — Encounter (HOSPITAL_COMMUNITY): Payer: Self-pay | Admitting: Anesthesiology

## 2019-01-16 ENCOUNTER — Encounter (HOSPITAL_COMMUNITY)
Admission: EM | Disposition: A | Discharge status: Home / Self Care | Payer: Self-pay | Source: Home / Self Care | Attending: Family Medicine

## 2019-01-16 ENCOUNTER — Encounter (HOSPITAL_COMMUNITY): Payer: Self-pay

## 2019-01-16 DIAGNOSIS — Z888 Allergy status to other drugs, medicaments and biological substances status: Secondary | ICD-10-CM | POA: Diagnosis not present

## 2019-01-16 DIAGNOSIS — K228 Other specified diseases of esophagus: Secondary | ICD-10-CM | POA: Diagnosis present

## 2019-01-16 DIAGNOSIS — Z8719 Personal history of other diseases of the digestive system: Secondary | ICD-10-CM | POA: Diagnosis not present

## 2019-01-16 DIAGNOSIS — K219 Gastro-esophageal reflux disease without esophagitis: Secondary | ICD-10-CM | POA: Diagnosis present

## 2019-01-16 DIAGNOSIS — E042 Nontoxic multinodular goiter: Secondary | ICD-10-CM | POA: Diagnosis present

## 2019-01-16 DIAGNOSIS — Z79899 Other long term (current) drug therapy: Secondary | ICD-10-CM | POA: Diagnosis not present

## 2019-01-16 DIAGNOSIS — Z791 Long term (current) use of non-steroidal anti-inflammatories (NSAID): Secondary | ICD-10-CM | POA: Diagnosis not present

## 2019-01-16 DIAGNOSIS — E1122 Type 2 diabetes mellitus with diabetic chronic kidney disease: Secondary | ICD-10-CM | POA: Diagnosis present

## 2019-01-16 DIAGNOSIS — N179 Acute kidney failure, unspecified: Secondary | ICD-10-CM | POA: Diagnosis present

## 2019-01-16 DIAGNOSIS — I129 Hypertensive chronic kidney disease with stage 1 through stage 4 chronic kidney disease, or unspecified chronic kidney disease: Secondary | ICD-10-CM | POA: Diagnosis present

## 2019-01-16 DIAGNOSIS — Z20828 Contact with and (suspected) exposure to other viral communicable diseases: Secondary | ICD-10-CM | POA: Diagnosis present

## 2019-01-16 DIAGNOSIS — Z9049 Acquired absence of other specified parts of digestive tract: Secondary | ICD-10-CM | POA: Diagnosis not present

## 2019-01-16 DIAGNOSIS — K222 Esophageal obstruction: Secondary | ICD-10-CM

## 2019-01-16 DIAGNOSIS — K2970 Gastritis, unspecified, without bleeding: Secondary | ICD-10-CM | POA: Diagnosis present

## 2019-01-16 DIAGNOSIS — E785 Hyperlipidemia, unspecified: Secondary | ICD-10-CM | POA: Diagnosis present

## 2019-01-16 DIAGNOSIS — R1310 Dysphagia, unspecified: Secondary | ICD-10-CM | POA: Diagnosis not present

## 2019-01-16 DIAGNOSIS — Z8701 Personal history of pneumonia (recurrent): Secondary | ICD-10-CM | POA: Diagnosis not present

## 2019-01-16 DIAGNOSIS — K2270 Barrett's esophagus without dysplasia: Secondary | ICD-10-CM | POA: Diagnosis present

## 2019-01-16 DIAGNOSIS — N181 Chronic kidney disease, stage 1: Secondary | ICD-10-CM | POA: Diagnosis present

## 2019-01-16 DIAGNOSIS — E1140 Type 2 diabetes mellitus with diabetic neuropathy, unspecified: Secondary | ICD-10-CM | POA: Diagnosis present

## 2019-01-16 DIAGNOSIS — M1990 Unspecified osteoarthritis, unspecified site: Secondary | ICD-10-CM | POA: Diagnosis present

## 2019-01-16 DIAGNOSIS — K5750 Diverticulosis of both small and large intestine without perforation or abscess without bleeding: Secondary | ICD-10-CM | POA: Diagnosis present

## 2019-01-16 DIAGNOSIS — K449 Diaphragmatic hernia without obstruction or gangrene: Secondary | ICD-10-CM | POA: Diagnosis present

## 2019-01-16 LAB — BASIC METABOLIC PANEL
Anion gap: 5 (ref 5–15)
BUN: 8 mg/dL (ref 8–23)
CO2: 23 mmol/L (ref 22–32)
Calcium: 8.1 mg/dL — ABNORMAL LOW (ref 8.9–10.3)
Chloride: 115 mmol/L — ABNORMAL HIGH (ref 98–111)
Creatinine, Ser: 0.71 mg/dL (ref 0.44–1.00)
GFR calc Af Amer: >60 mL/min (ref >60)
GFR calc non Af Amer: >60 mL/min (ref >60)
Glucose, Bld: 148 mg/dL — ABNORMAL HIGH (ref 70–99)
Potassium: 3.6 mmol/L (ref 3.5–5.1)
Sodium: 143 mmol/L (ref 135–145)

## 2019-01-16 LAB — CBC
HCT: 33.3 % — ABNORMAL LOW (ref 36.0–46.0)
Hemoglobin: 10.9 g/dL — ABNORMAL LOW (ref 12.0–15.0)
MCH: 30.5 pg (ref 26.0–34.0)
MCHC: 32.7 g/dL (ref 30.0–36.0)
MCV: 93.3 fL (ref 80.0–100.0)
Platelets: 159 10*3/uL (ref 150–400)
RBC: 3.57 MIL/uL — ABNORMAL LOW (ref 3.87–5.11)
RDW: 12.9 % (ref 11.5–15.5)
WBC: 3.9 10*3/uL — ABNORMAL LOW (ref 4.0–10.5)
nRBC: 0 % (ref 0.0–0.2)

## 2019-01-16 LAB — GLUCOSE, CAPILLARY
Glucose-Capillary: 114 mg/dL — ABNORMAL HIGH (ref 70–99)
Glucose-Capillary: 93 mg/dL (ref 70–99)
Glucose-Capillary: 89 mg/dL (ref 70–99)
Glucose-Capillary: 102 mg/dL — ABNORMAL HIGH (ref 70–99)

## 2019-01-16 LAB — SARS CORONAVIRUS 2 BY RT PCR (HOSPITAL ORDER, PERFORMED IN ~~LOC~~ HOSPITAL LAB): SARS Coronavirus 2: NEGATIVE

## 2019-01-16 SURGERY — CANCELLED PROCEDURE

## 2019-01-16 SURGICAL SUPPLY — 15 items

## 2019-01-16 NOTE — H&P (View-Only) (Signed)
UNASSIGNED PATIENT Subjective: Chloe Harding is a 73 year old white female who was seen as an unassigned patient last year and underwent an EGD for dysphagia. She was found to have a cratered ulcer in the esophagus that was biopsied but no malignancy was noted at that time. 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  Patient claims she was doing well till about 2 weeks ago when her difficulty swallowing got worse and she could not even get liquids down prompting her to come to the emergency room.  CT of the chest revealed moderate proximal esophageal dilation with fluid level within the esophagus suggesting a stricture at the GE junction.  Patient was brought to endoscopy today for an EGD but had to be kept with that had to be canceled as she had COVID test results were not back yet.  Objective: Vital signs in last 24 hours: Temp:  [98.4 F (36.9 C)-99 F (37.2 C)] 98.4 F (36.9 C) (08/31 1412) Pulse Rate:  [83-99] 99 (08/31 1412) Resp:  [17-22] 17 (08/31 1412) BP: (140-199)/(68-88) 140/68 (08/31 1412) SpO2:  [97 %-100 %] 99 % (08/31 1412) Last BM Date: 01/14/19  Intake/Output from previous day: 08/30 0701 - 08/31 0700 In: 3329 [P.O.:160; I.V.:3169] Out: 1350 [Urine:1350] Intake/Output this shift: Total I/O In: 347.5 [P.O.:20; I.V.:327.5] Out: 1500 [Urine:1500]  General appearance: alert, cooperative, appears stated age, fatigued and no distress Resp: clear to auscultation bilaterally Cardio: regular rate and rhythm, S1, S2 normal, no murmur, click, rub or gallop GI: soft, non-tender; bowel sounds normal; no masses,  no organomegaly Extremities: extremities normal, atraumatic, no cyanosis or edema  Lab Results: Recent Labs    01/15/19 0126 01/16/19 0320  WBC 5.3 3.9*  HGB 11.9* 10.9*  HCT 37.2 33.3*  PLT 199 159   BMET Recent Labs    01/15/19 0126 01/16/19 0320  NA 144 143  K 3.5 3.6  CL 111 115*  CO2 25 23   GLUCOSE 129* 148*  BUN 19 8  CREATININE 1.42* 0.71  CALCIUM 9.1 8.1*   LFT Recent Labs    01/15/19 0126  PROT 7.2  ALBUMIN 3.7  AST 24  ALT 12  ALKPHOS 77  BILITOT 0.7   PT/INR Recent Labs    01/15/19 0941  LABPROT 13.8  INR 1.1   Hepatitis Panel No results for input(s): HEPBSAG, HCVAB, HEPAIGM, HEPBIGM in the last 72 hours. C-Diff No results for input(s): CDIFFTOX in the last 72 hours. No results for input(s): CDIFFPCR in the last 72 hours. Fecal Lactopherrin No results for input(s): FECLLACTOFRN in the last 72 hours.  Studies/Results: Ct Abdomen Pelvis Wo Contrast  Result Date: 01/15/2019 CLINICAL DATA:  Dysphagia. Mild abdominal pain. Possible esophageal stricture. EXAM: CT CHEST, ABDOMEN AND PELVIS WITHOUT CONTRAST TECHNIQUE: Multidetector CT imaging of the chest, abdomen and pelvis was performed following the standard protocol without IV contrast. COMPARISON:  Chest radiograph 10/02/2017. Esophagram of 05/21/2011. Prior abdominopelvic CT 12/12/2009 FINDINGS: CT CHEST FINDINGS Cardiovascular: Aortic and branch vessel atherosclerosis. Tortuous thoracic aorta. Mild cardiomegaly, without pericardial effusion. Multivessel coronary artery atherosclerosis. Mediastinum/Nodes: Right-sided thyroid nodule of 2.2 cm on 01/02. No mediastinal or definite hilar adenopathy, given limitations of unenhanced CT. Small hiatal hernia. The esophagus is moderately dilated and fluid-filled. This continues to the level of a transition to more mildly dilated esophagus. Example transition site on coronal image 85. This is in the same vicinity of the stricture on the esophagram of 05/21/2011. Lungs/Pleura: No pleural fluid. Moderate  right hemidiaphragm elevation. Right lower lobe adjacent volume loss and atelectasis. Right apical pleural-based pulmonary nodule of 4 mm on 21/4. Musculoskeletal: Mild T11 compression deformity with no ventral canal encroachment. CT ABDOMEN PELVIS FINDINGS Hepatobiliary:  Normal liver. Cholecystectomy, without biliary ductal dilatation. Pancreas: Normal, without mass or ductal dilatation. Spleen: Normal in size, without focal abnormality. Adrenals/Urinary Tract: Normal adrenal glands. Mild renal cortical thinning bilaterally. Normal urinary bladder. Stomach/Bowel: Equivocal fold thickening involving the gastric body on 46/2. Periampullary duodenal diverticulum. Otherwise normal small bowel. Scattered colonic diverticula. Normal terminal ileum. Vascular/Lymphatic: Advanced aortic and branch vessel atherosclerosis. No abdominopelvic adenopathy. Reproductive: Normal uterus and adnexa. Other: No significant free fluid.  Moderate pelvic floor laxity. Musculoskeletal: Osteopenia. Disc bulge at the lumbosacral junction. IMPRESSION: 1. Moderate proximal esophageal dilatation with fluid level within. Transition in the lower esophagus to more mildly dilated suggesting either benign or malignant stricture. This is in the region of a stricture on 05/31/2011 esophagram. 2. Right thyroid nodule which should be considered for nonemergent ultrasound. This follows ACR consensus guidelines: Managing Incidental Thyroid Nodules Detected on Imaging: White Paper of the ACR Incidental Thyroid Findings Committee. J Am Coll Radiol 2015; 12:143-150. 3. Coronary artery atherosclerosis. Aortic Atherosclerosis (ICD10-I70.0). 4.  Tiny hiatal hernia. 5. Equivocal gastric fold thickening as can be seen with gastritis. 6. Right apical pleural-based 4 mm nodule. No follow-up needed if patient is low-risk. Non-contrast chest CT can be considered in 12 months if patient is high-risk. This recommendation follows the consensus statement: Guidelines for Management of Incidental Pulmonary Nodules Detected on CT Images: From the Fleischner Society 2017; Radiology 2017; 284:228-243. Electronically Signed   By: Abigail Miyamoto M.D.   On: 01/15/2019 05:41   Ct Chest Wo Contrast  Result Date: 01/15/2019 CLINICAL DATA:   Dysphagia. Mild abdominal pain. Possible esophageal stricture. EXAM: CT CHEST, ABDOMEN AND PELVIS WITHOUT CONTRAST TECHNIQUE: Multidetector CT imaging of the chest, abdomen and pelvis was performed following the standard protocol without IV contrast. COMPARISON:  Chest radiograph 10/02/2017. Esophagram of 05/21/2011. Prior abdominopelvic CT 12/12/2009 FINDINGS: CT CHEST FINDINGS Cardiovascular: Aortic and branch vessel atherosclerosis. Tortuous thoracic aorta. Mild cardiomegaly, without pericardial effusion. Multivessel coronary artery atherosclerosis. Mediastinum/Nodes: Right-sided thyroid nodule of 2.2 cm on 01/02. No mediastinal or definite hilar adenopathy, given limitations of unenhanced CT. Small hiatal hernia. The esophagus is moderately dilated and fluid-filled. This continues to the level of a transition to more mildly dilated esophagus. Example transition site on coronal image 85. This is in the same vicinity of the stricture on the esophagram of 05/21/2011. Lungs/Pleura: No pleural fluid. Moderate right hemidiaphragm elevation. Right lower lobe adjacent volume loss and atelectasis. Right apical pleural-based pulmonary nodule of 4 mm on 21/4. Musculoskeletal: Mild T11 compression deformity with no ventral canal encroachment. CT ABDOMEN PELVIS FINDINGS Hepatobiliary: Normal liver. Cholecystectomy, without biliary ductal dilatation. Pancreas: Normal, without mass or ductal dilatation. Spleen: Normal in size, without focal abnormality. Adrenals/Urinary Tract: Normal adrenal glands. Mild renal cortical thinning bilaterally. Normal urinary bladder. Stomach/Bowel: Equivocal fold thickening involving the gastric body on 46/2. Periampullary duodenal diverticulum. Otherwise normal small bowel. Scattered colonic diverticula. Normal terminal ileum. Vascular/Lymphatic: Advanced aortic and branch vessel atherosclerosis. No abdominopelvic adenopathy. Reproductive: Normal uterus and adnexa. Other: No significant free  fluid.  Moderate pelvic floor laxity. Musculoskeletal: Osteopenia. Disc bulge at the lumbosacral junction. IMPRESSION: 1. Moderate proximal esophageal dilatation with fluid level within. Transition in the lower esophagus to more mildly dilated suggesting either benign or malignant stricture. This is in the region of a  stricture on 05/31/2011 esophagram. 2. Right thyroid nodule which should be considered for nonemergent ultrasound. This follows ACR consensus guidelines: Managing Incidental Thyroid Nodules Detected on Imaging: White Paper of the ACR Incidental Thyroid Findings Committee. J Am Coll Radiol 2015; 12:143-150. 3. Coronary artery atherosclerosis. Aortic Atherosclerosis (ICD10-I70.0). 4.  Tiny hiatal hernia. 5. Equivocal gastric fold thickening as can be seen with gastritis. 6. Right apical pleural-based 4 mm nodule. No follow-up needed if patient is low-risk. Non-contrast chest CT can be considered in 12 months if patient is high-risk. This recommendation follows the consensus statement: Guidelines for Management of Incidental Pulmonary Nodules Detected on CT Images: From the Fleischner Society 2017; Radiology 2017; 284:228-243. Electronically Signed   By: Abigail Miyamoto M.D.   On: 01/15/2019 05:41   US Thyroid  Result Date: 01/15/2019 CLINICAL DATA:  Incidental on CT. 73 year old female with right-sided thyroid nodule seen on recent CT scan EXAM: THYROID ULTRASOUND TECHNIQUE: Ultrasound examination of the thyroid gland and adjacent soft tissues was performed. COMPARISON:  CT scan of the chest obtained earlier today FINDINGS: Parenchymal Echotexture: Mildly heterogenous Isthmus: 2.4 cm Right lobe: 4.3 x 2.7 x 3.0 cm Left lobe: 4.5 x 1.3 x 1.1 cm _________________________________________________________ Estimated total number of nodules >/= 1 cm: 3 Number of spongiform nodules >/=  2 cm not described below (TR1): 0 Number of mixed cystic and solid nodules >/= 1.5 cm not described below (TR2): 0  _________________________________________________________ Nodule # 1: Location: Right; Mid Maximum size: 2.6 cm; Other 2 dimensions: 2.3 x 2.5 cm Composition: solid/almost completely solid (2) Echogenicity: isoechoic (1) Shape: not taller-than-wide (0) Margins: smooth (0) Echogenic foci: none (0) ACR TI-RADS total points: 3. ACR TI-RADS risk category: TR3 (3 points). ACR TI-RADS recommendations: **Given size (>/= 2.5 cm) and appearance, fine needle aspiration of this mildly suspicious nodule should be considered based on TI-RADS criteria. _________________________________________________________ Nodule # 2: Location: Right; Mid Maximum size: 1.4 cm; Other 2 dimensions: 1.3 x 1.4 cm Composition: solid/almost completely solid (2) Echogenicity: hypoechoic (2) Shape: not taller-than-wide (0) Margins: ill-defined (0) Echogenic foci: none (0) ACR TI-RADS total points: 4. ACR TI-RADS risk category: TR4 (4-6 points). ACR TI-RADS recommendations: *Given size (>/= 1 - 1.4 cm) and appearance, a follow-up ultrasound in 1 year should be considered based on TI-RADS criteria. _________________________________________________________ Nodule # 3: Small isoechoic solid nodule in the left lower gland measures no more than 1.0 cm. This nodule does not meet criteria for further evaluation. _________________________________________________________ IMPRESSION: 1. Approximately 2.6 cm TI-RADS category 3 (mildly suspicious) nodule in the right mid gland meets criteria for consideration of fine-needle aspiration biopsy. 2. An adjacent 1.4 cm TI-RADS category 4 nodule also in the right mid gland meets criteria for follow-up ultrasound in 1 year. The above is in keeping with the ACR TI-RADS recommendations - J Am Coll Radiol 2017;14:587-595. Electronically Signed   By: Jacqulynn Cadet M.D.   On: 01/15/2019 11:15    Medications: I have reviewed the patient's current medications.  Assessment/Plan: 1) Worsening dysphagia with a 15 pound  weight loss in the last 2 to 4 weeks-and esophageal stricture noted on CT scan/Barrett's esophagus diagnosed on EGD with biopsies done last year/abnormal CT scan of the GI tract.  We will proceed with a EGD and biopsies tomorrow make further recommendations and follow-up. 2) Diverticulosis and history of colonic polyp 3) Acute on chronic kidney injury. 4) Hypertension.  LOS: 0 days   Juanita Craver 01/16/2019, 3:43 PM

## 2019-01-16 NOTE — Progress Notes (Signed)
  PROGRESS NOTE  Chloe Harding [id number] DOB: 30-Apr-1946 DOA: 01/15/2019 PCP: Elenore Paddy, FNP  Brief History   73 year old female no former smoking or drinking dysphagia status post esophageal dilatation [facility]-prior Barrett's esophagus-follows with Dr. 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 intake weight loss solid and liquid dysphagia CT chest/ABD proximal esophageal dilated [place] + fluid level-thyroid nodule-pleural nodule  A & P  Dysphagia stricture prior Barrett's esophagus prior GI bleed Dr. Collene Mares aware-n.p.o. for procedure TY 2 DM A1c 5.2+ neuropathy + nephropathy-diet-controlled Trends towards hypoglycemia-placed on D5 until procedure-continue gabapentin 600 twice daily 1 okay to 4 p.o. need to clarify med dosing HTN On lisinopril 10 might need to cut back dose given prerenal azotemia on admission AKI on admission Combined poor p.o. intake plus ACE inhibitor-labs a.m. Thyroid nodule TSH normal therefore needs biopsy arranged outpatient Pleural nodule Non-smoker-wonder if related to above thyroid issue   DVT prophylaxis: Lovenox Code Status: Full Family Communication: None Disposition Plan: Await input   Verneita Griffes, MD [facility] 10:04 AM  01/16/2019, 10:04 AM  LOS: 0 days   Consultants  Dr. Collene Mares of GI  Objective   Vitals:  Vitals:   01/16/19 0504 01/16/19 0728  BP: (!) 175/88 (!) 154/74  Pulse: 99 95  Resp: 17   Temp: 98.5 F (36.9 C)   SpO2: 98%     Exam:  Awake alert coherent no distress external ocular movements intact Chest clear no added sound Abdomen soft nontender No lower extremity edema    I have personally reviewed the following:   Today's Data  . Hemoglobin 10.9 baseline around 11 . BUN/creatinine 8/0.7 down from admission 19/1.4   Other Data  . None   Scheduled Meds: . amitriptyline  25 mg Oral QHS  . enoxaparin (LOVENOX) injection  40 mg Subcutaneous Q24H  . folic acid  1 mg Oral Daily  . gabapentin  300 mg Oral QHS  . insulin aspart  0-9 Units Subcutaneous TID WC  . lisinopril  10 mg Oral Daily  . pantoprazole (PROTONIX) IV  40 mg Intravenous Q12H  . rOPINIRole  4 mg Oral QHS   Continuous Infusions: . dextrose 5 % and 0.9% NaCl 50 mL/hr at 01/16/19 [id number]    Active Problems:   Dysphagia   LOS: 0 days   How to contact the [facility] Attending or Consulting provider [place] or covering provider during after hours [place], for this patient?  1. Check the care team in [facility] and look for a) attending/consulting [facility] provider listed and b) the [facility] team listed 2. Log into [email or web address] and use [place] universal password to access. If you do not have the password, please contact the hospital operator. 3. Locate the [facility] provider you are looking for under [facility] and page to a number that you can be directly reached. 4. If you still have difficulty reaching the provider, please page the [facility] (Director on Call) for the Hospitalists listed on amion for assistance.

## 2019-01-16 NOTE — Progress Notes (Signed)
UNASSIGNED PATIENT Subjective: [name] is a 73 year old white female who was seen as an unassigned patient last year and underwent an EGD for dysphagia. She was found to have a cratered ulcer in the esophagus that was biopsied but no malignancy was noted at that time. 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  Patient claims she was doing well till about 2 weeks ago when her difficulty swallowing got worse and she could not even get liquids down prompting her to come to the emergency room.  CT of the chest revealed moderate proximal esophageal dilation with fluid level within the esophagus suggesting a stricture at the GE junction.  Patient was brought to endoscopy today for an EGD but had to be kept with that had to be canceled as she had COVID test results were not back yet.  Objective: Vital signs in last 24 hours: Temp:  [98.4 F (36.9 C)-99 F (37.2 C)] 98.4 F (36.9 C) (08/31 1412) Pulse Rate:  [83-99] 99 (08/31 1412) Resp:  [17-22] 17 (08/31 1412) BP: (140-199)/(68-88) 140/68 (08/31 1412) SpO2:  [97 %-100 %] 99 % (08/31 1412) Last BM Date: 01/14/19  Intake/Output from previous day: 08/30 0701 - 08/31 0700 In: 3329 [P.O.:160; I.V.:3169] Out: 1350 [Urine:1350] Intake/Output this shift: Total I/O In: 347.5 [P.O.:20; I.V.:327.5] Out: 1500 [Urine:1500]  General appearance: alert, cooperative, appears stated age, fatigued and no distress Resp: clear to auscultation bilaterally Cardio: regular rate and rhythm, S1, S2 normal, no murmur, click, rub or gallop GI: soft, non-tender; bowel sounds normal; no masses,  no organomegaly Extremities: extremities normal, atraumatic, no cyanosis or edema  Lab Results: Recent Labs    01/15/19 0126 01/16/19 0320  WBC 5.3 3.9*  HGB 11.9* 10.9*  HCT 37.2 33.3*  PLT 199 159   BMET Recent Labs    01/15/19 0126 01/16/19 0320  NA 144 143  K 3.5 3.6  CL 111 115*  CO2 25 23   GLUCOSE 129* 148*  BUN 19 8  CREATININE 1.42* 0.71  CALCIUM 9.1 8.1*   LFT Recent Labs    01/15/19 0126  PROT 7.2  ALBUMIN 3.7  AST 24  ALT 12  ALKPHOS 77  BILITOT 0.7   PT/INR Recent Labs    01/15/19 0941  LABPROT 13.8  INR 1.1   Hepatitis Panel No results for input(s): HEPBSAG, HCVAB, HEPAIGM, HEPBIGM in the last 72 hours. C-Diff No results for input(s): CDIFFTOX in the last 72 hours. No results for input(s): CDIFFPCR in the last 72 hours. Fecal Lactopherrin No results for input(s): FECLLACTOFRN in the last 72 hours.  Studies/Results: Ct Abdomen Pelvis Wo Contrast  Result Date: 01/15/2019 CLINICAL DATA:  Dysphagia. Mild abdominal pain. Possible esophageal stricture. EXAM: CT CHEST, ABDOMEN AND PELVIS WITHOUT CONTRAST TECHNIQUE: Multidetector CT imaging of the chest, abdomen and pelvis was performed following the standard protocol without IV contrast. COMPARISON:  Chest radiograph 10/02/2017. Esophagram of 05/21/2011. Prior abdominopelvic CT 12/12/2009 FINDINGS: CT CHEST FINDINGS Cardiovascular: Aortic and branch vessel atherosclerosis. Tortuous thoracic aorta. Mild cardiomegaly, without pericardial effusion. Multivessel coronary artery atherosclerosis. Mediastinum/Nodes: Right-sided thyroid nodule of 2.2 cm on 01/02. No mediastinal or definite hilar adenopathy, given limitations of unenhanced CT. Small hiatal hernia. The esophagus is moderately dilated and fluid-filled. This continues to the level of a transition to more mildly dilated esophagus. Example transition site on coronal image 85. This is in the same vicinity of the stricture on the esophagram of 05/21/2011. Lungs/Pleura: No pleural fluid. Moderate  right hemidiaphragm elevation. Right lower lobe adjacent volume loss and atelectasis. Right apical pleural-based pulmonary nodule of 4 mm on 21/4. Musculoskeletal: Mild T11 compression deformity with no ventral canal encroachment. CT ABDOMEN PELVIS FINDINGS Hepatobiliary:  Normal liver. Cholecystectomy, without biliary ductal dilatation. Pancreas: Normal, without mass or ductal dilatation. Spleen: Normal in size, without focal abnormality. Adrenals/Urinary Tract: Normal adrenal glands. Mild renal cortical thinning bilaterally. Normal urinary bladder. Stomach/Bowel: Equivocal fold thickening involving the gastric body on 46/2. Periampullary duodenal diverticulum. Otherwise normal small bowel. Scattered colonic diverticula. Normal terminal ileum. Vascular/Lymphatic: Advanced aortic and branch vessel atherosclerosis. No abdominopelvic adenopathy. Reproductive: Normal uterus and adnexa. Other: No significant free fluid.  Moderate pelvic floor laxity. Musculoskeletal: Osteopenia. Disc bulge at the lumbosacral junction. IMPRESSION: 1. Moderate proximal esophageal dilatation with fluid level within. Transition in the lower esophagus to more mildly dilated suggesting either benign or malignant stricture. This is in the region of a stricture on 05/31/2011 esophagram. 2. Right thyroid nodule which should be considered for nonemergent ultrasound. This follows ACR consensus guidelines: Managing Incidental Thyroid Nodules Detected on Imaging: White Paper of the ACR Incidental Thyroid Findings Committee. J Am Coll Radiol 2015; 12:143-150. 3. Coronary artery atherosclerosis. Aortic Atherosclerosis (ICD10-I70.0). 4.  Tiny hiatal hernia. 5. Equivocal gastric fold thickening as can be seen with gastritis. 6. Right apical pleural-based 4 mm nodule. No follow-up needed if patient is low-risk. Non-contrast chest CT can be considered in 12 months if patient is high-risk. This recommendation follows the consensus statement: Guidelines for Management of Incidental Pulmonary Nodules Detected on CT Images: From the Fleischner Society 2017; Radiology 2017; 284:228-243. Electronically Signed   By: Abigail Miyamoto M.D.   On: 01/15/2019 05:41   Ct Chest Wo Contrast  Result Date: 01/15/2019 CLINICAL DATA:   Dysphagia. Mild abdominal pain. Possible esophageal stricture. EXAM: CT CHEST, ABDOMEN AND PELVIS WITHOUT CONTRAST TECHNIQUE: Multidetector CT imaging of the chest, abdomen and pelvis was performed following the standard protocol without IV contrast. COMPARISON:  Chest radiograph 10/02/2017. Esophagram of 05/21/2011. Prior abdominopelvic CT 12/12/2009 FINDINGS: CT CHEST FINDINGS Cardiovascular: Aortic and branch vessel atherosclerosis. Tortuous thoracic aorta. Mild cardiomegaly, without pericardial effusion. Multivessel coronary artery atherosclerosis. Mediastinum/Nodes: Right-sided thyroid nodule of 2.2 cm on 01/02. No mediastinal or definite hilar adenopathy, given limitations of unenhanced CT. Small hiatal hernia. The esophagus is moderately dilated and fluid-filled. This continues to the level of a transition to more mildly dilated esophagus. Example transition site on coronal image 85. This is in the same vicinity of the stricture on the esophagram of 05/21/2011. Lungs/Pleura: No pleural fluid. Moderate right hemidiaphragm elevation. Right lower lobe adjacent volume loss and atelectasis. Right apical pleural-based pulmonary nodule of 4 mm on 21/4. Musculoskeletal: Mild T11 compression deformity with no ventral canal encroachment. CT ABDOMEN PELVIS FINDINGS Hepatobiliary: Normal liver. Cholecystectomy, without biliary ductal dilatation. Pancreas: Normal, without mass or ductal dilatation. Spleen: Normal in size, without focal abnormality. Adrenals/Urinary Tract: Normal adrenal glands. Mild renal cortical thinning bilaterally. Normal urinary bladder. Stomach/Bowel: Equivocal fold thickening involving the gastric body on 46/2. Periampullary duodenal diverticulum. Otherwise normal small bowel. Scattered colonic diverticula. Normal terminal ileum. Vascular/Lymphatic: Advanced aortic and branch vessel atherosclerosis. No abdominopelvic adenopathy. Reproductive: Normal uterus and adnexa. Other: No significant free  fluid.  Moderate pelvic floor laxity. Musculoskeletal: Osteopenia. Disc bulge at the lumbosacral junction. IMPRESSION: 1. Moderate proximal esophageal dilatation with fluid level within. Transition in the lower esophagus to more mildly dilated suggesting either benign or malignant stricture. This is in the region of a  stricture on 05/31/2011 esophagram. 2. Right thyroid nodule which should be considered for nonemergent ultrasound. This follows ACR consensus guidelines: Managing Incidental Thyroid Nodules Detected on Imaging: White Paper of the ACR Incidental Thyroid Findings Committee. J Am Coll Radiol 2015; 12:143-150. 3. Coronary artery atherosclerosis. Aortic Atherosclerosis (ICD10-I70.0). 4.  Tiny hiatal hernia. 5. Equivocal gastric fold thickening as can be seen with gastritis. 6. Right apical pleural-based 4 mm nodule. No follow-up needed if patient is low-risk. Non-contrast chest CT can be considered in 12 months if patient is high-risk. This recommendation follows the consensus statement: Guidelines for Management of Incidental Pulmonary Nodules Detected on CT Images: From the Fleischner Society 2017; Radiology 2017; 284:228-243. Electronically Signed   By: Abigail Miyamoto M.D.   On: 01/15/2019 05:41   US Thyroid  Result Date: 01/15/2019 CLINICAL DATA:  Incidental on CT. 73 year old female with right-sided thyroid nodule seen on recent CT scan EXAM: THYROID ULTRASOUND TECHNIQUE: Ultrasound examination of the thyroid gland and adjacent soft tissues was performed. COMPARISON:  CT scan of the chest obtained earlier today FINDINGS: Parenchymal Echotexture: Mildly heterogenous Isthmus: 2.4 cm Right lobe: 4.3 x 2.7 x 3.0 cm Left lobe: 4.5 x 1.3 x 1.1 cm _________________________________________________________ Estimated total number of nodules >/= 1 cm: 3 Number of spongiform nodules >/=  2 cm not described below (TR1): 0 Number of mixed cystic and solid nodules >/= 1.5 cm not described below (TR2): 0  _________________________________________________________ Nodule # 1: Location: Right; Mid Maximum size: 2.6 cm; Other 2 dimensions: 2.3 x 2.5 cm Composition: solid/almost completely solid (2) Echogenicity: isoechoic (1) Shape: not taller-than-wide (0) Margins: smooth (0) Echogenic foci: none (0) ACR TI-RADS total points: 3. ACR TI-RADS risk category: TR3 (3 points). ACR TI-RADS recommendations: **Given size (>/= 2.5 cm) and appearance, fine needle aspiration of this mildly suspicious nodule should be considered based on TI-RADS criteria. _________________________________________________________ Nodule # 2: Location: Right; Mid Maximum size: 1.4 cm; Other 2 dimensions: 1.3 x 1.4 cm Composition: solid/almost completely solid (2) Echogenicity: hypoechoic (2) Shape: not taller-than-wide (0) Margins: ill-defined (0) Echogenic foci: none (0) ACR TI-RADS total points: 4. ACR TI-RADS risk category: TR4 (4-6 points). ACR TI-RADS recommendations: *Given size (>/= 1 - 1.4 cm) and appearance, a follow-up ultrasound in 1 year should be considered based on TI-RADS criteria. _________________________________________________________ Nodule # 3: Small isoechoic solid nodule in the left lower gland measures no more than 1.0 cm. This nodule does not meet criteria for further evaluation. _________________________________________________________ IMPRESSION: 1. Approximately 2.6 cm TI-RADS category 3 (mildly suspicious) nodule in the right mid gland meets criteria for consideration of fine-needle aspiration biopsy. 2. An adjacent 1.4 cm TI-RADS category 4 nodule also in the right mid gland meets criteria for follow-up ultrasound in 1 year. The above is in keeping with the ACR TI-RADS recommendations - J Am Coll Radiol 2017;14:587-595. Electronically Signed   By: Jacqulynn Cadet M.D.   On: 01/15/2019 11:15    Medications: I have reviewed the patient's current medications.  Assessment/Plan: 1) Worsening dysphagia with a 15 pound  weight loss in the last 2 to 4 weeks-and esophageal stricture noted on CT scan/Barrett's esophagus diagnosed on EGD with biopsies done last year/abnormal CT scan of the GI tract.  We will proceed with a EGD and biopsies tomorrow make further recommendations and follow-up. 2) Diverticulosis and history of colonic polyp 3) Acute on chronic kidney injury. 4) Hypertension.  LOS: 0 days   Juanita Craver 01/16/2019, 3:43 PM

## 2019-01-16 NOTE — Anesthesia Preprocedure Evaluation (Deleted)

## 2019-01-16 NOTE — Evaluation (Signed)
Physical Therapy One Time Evaluation Patient Details Name: Chloe Harding MRN: [id number] DOB: 23-Jan-1946 Today's Date: 01/16/2019   History of Present Illness  73 y.o. female with medical history significant of hypertension, diabetes, depression, hiatal hernia, hyperlipidemia history of dysphagia about 15 years ago status post esophageal dilatation at [facility] and admitted with dysphagia  Clinical Impression  Patient evaluated by Physical Therapy with no further acute PT needs identified. All education has been completed and the patient has no further questions.  Pt reports up ad lib around her room prior to my arrival and ambulated in hallway this visit without assistive device.  Pt's mobility likely near baseline.  Pt reports she lives with her husband, and he is at work during the day.  Pt encouraged to continue ambulating during acute stay.  See below for any follow-up Physical Therapy or equipment needs. PT is signing off. Thank you for this referral.     Follow Up Recommendations No PT follow up    Equipment Recommendations  None recommended by PT    Recommendations for Other Services       Precautions / Restrictions Precautions Precautions: Fall      Mobility  Bed Mobility Overal bed mobility: Modified Independent                Transfers Overall transfer level: Needs assistance Equipment used: None Transfers: Sit to/from Stand Sit to Stand: Supervision            Ambulation/Gait Ambulation/Gait assistance: Supervision;Min guard Gait Distance (Feet): 200 Feet Assistive device: None Gait Pattern/deviations: Step-through pattern;Decreased stride length;Ataxic Gait velocity: decr   General Gait Details: slightly ataxic gait however likely baseline as pt reports gait feels normal; no physical assist required for mild unsteadiness  Stairs            Wheelchair Mobility    Modified Rankin (Stroke Patients Only)       Balance Overall  balance assessment: Mild deficits observed, not formally tested(pt denies any recent falls)                                           Pertinent Vitals/Pain Pain Assessment: No/denies pain    Home Living Family/patient expects to be discharged to:: Private residence Living Arrangements: Spouse/significant other   Type of Home: House Home Access: Stairs to enter Entrance Stairs-Rails: Right Entrance Stairs-Number of Steps: 4-5 Home Layout: One level Home Equipment: Walker - 2 wheels;Cane - single point      Prior Function Level of Independence: Independent         Comments: pt reports she has cane her car if needed however if she feels "bad" enough to use a cane then she typically won't leave the house     Hand Dominance        Extremity/Trunk Assessment        Lower Extremity Assessment Lower Extremity Assessment: Overall WFL for tasks assessed    Cervical / Trunk Assessment Cervical / Trunk Assessment: Kyphotic(forward head posture)  Communication   Communication: No difficulties  Cognition Arousal/Alertness: Awake/alert Behavior During Therapy: WFL for tasks assessed/performed Overall Cognitive Status: Within Functional Limits for tasks assessed  General Comments      Exercises     Assessment/Plan    PT Assessment Patent does not need any further PT services  PT Problem List         PT Treatment Interventions      PT Goals (Current goals can be found in the Care Plan section)  Acute Rehab PT Goals PT Goal Formulation: All assessment and education complete, DC therapy    Frequency     Barriers to discharge        Co-evaluation               AM-PAC PT "6 Clicks" Mobility  Outcome Measure Help needed turning from your back to your side while in a flat bed without using bedrails?: None Help needed moving from lying on your back to sitting on the side of a flat  bed without using bedrails?: None Help needed moving to and from a bed to a chair (including a wheelchair)?: None Help needed standing up from a chair using your arms (e.g., wheelchair or bedside chair)?: None Help needed to walk in hospital room?: None Help needed climbing 3-5 steps with a railing? : A Little 6 Click Score: 23    End of Session   Activity Tolerance: Patient tolerated treatment well Patient left: in bed;with call bell/phone within reach   PT Visit Diagnosis: Difficulty in walking, not elsewhere classified (R26.2)    Time: [id number] PT Time Calculation (min) (ACUTE ONLY): 10 min   Charges:   PT Evaluation $PT Eval Low Complexity: [place], PT, DPT Acute Rehabilitation Services Office: [phone] Pager: [phone]  Florence Yeung,KATHrine E 01/16/2019, 11:11 AM

## 2019-01-16 NOTE — Progress Notes (Signed)
Patient brought down to Endo for procedure. Anesthesiology found that patient was without any covid testing. Unable to do procedure related to no pending covid test. Santiago Glad RN - Charge RN, speaking with patient at this time.

## 2019-01-17 ENCOUNTER — Encounter (HOSPITAL_COMMUNITY)
Admission: EM | Disposition: A | Discharge status: Home / Self Care | Payer: Self-pay | Source: Home / Self Care | Attending: Family Medicine

## 2019-01-17 ENCOUNTER — Inpatient Hospital Stay (HOSPITAL_COMMUNITY): Payer: Medicare Other | Admitting: Registered Nurse

## 2019-01-17 HISTORY — PX: SAVORY DILATION: SHX5439

## 2019-01-17 HISTORY — PX: ESOPHAGOGASTRODUODENOSCOPY (EGD) WITH PROPOFOL: SHX5813

## 2019-01-17 LAB — GLUCOSE, CAPILLARY
Glucose-Capillary: 113 mg/dL — ABNORMAL HIGH (ref 70–99)
Glucose-Capillary: 102 mg/dL — ABNORMAL HIGH (ref 70–99)
Glucose-Capillary: 86 mg/dL (ref 70–99)
Glucose-Capillary: 142 mg/dL — ABNORMAL HIGH (ref 70–99)

## 2019-01-17 SURGERY — ESOPHAGOGASTRODUODENOSCOPY (EGD) WITH PROPOFOL
Anesthesia: Monitor Anesthesia Care

## 2019-01-17 MED ORDER — LIDOCAINE 2% (20 MG/ML) 5 ML SYRINGE
INTRAMUSCULAR | Status: DC | PRN
  Administered 2019-01-17: 60 mg via INTRAVENOUS
  Administered 2019-01-17: 40 mg via INTRAVENOUS

## 2019-01-17 MED ORDER — PROPOFOL 10 MG/ML IV BOLUS
INTRAVENOUS | Status: DC | PRN
  Administered 2019-01-17 (×3): 20 mg via INTRAVENOUS
  Administered 2019-01-17: 40 mg via INTRAVENOUS
  Administered 2019-01-17: 20 mg via INTRAVENOUS
  Administered 2019-01-17 (×3): 40 mg via INTRAVENOUS

## 2019-01-17 MED ORDER — PROPOFOL 500 MG/50ML IV EMUL
INTRAVENOUS | Status: DC | PRN
  Administered 2019-01-17: 130 ug/kg/min via INTRAVENOUS

## 2019-01-17 MED ORDER — PROPOFOL 10 MG/ML IV BOLUS
INTRAVENOUS | Status: AC
  Filled 2019-01-17: qty 40

## 2019-01-17 MED ORDER — LACTATED RINGERS IV SOLN
INTRAVENOUS | Status: DC | PRN
  Administered 2019-01-17: 14:00:00 via INTRAVENOUS

## 2019-01-17 SURGICAL SUPPLY — 15 items

## 2019-01-17 NOTE — Interval H&P Note (Signed)
History and Physical Interval Note:  01/17/2019 1:36 PM  Chloe Harding  has presented today for surgery, with the diagnosis of dysphagia.  The various methods of treatment have been discussed with the patient and family. After consideration of risks, benefits and other options for treatment, the patient has consented to  Procedure(s): ESOPHAGOGASTRODUODENOSCOPY (EGD) WITH PROPOFOL (N/A) as a surgical intervention.  The patient's history has been reviewed, patient examined, no change in status, stable for surgery.  I have reviewed the patient's chart and labs.  Questions were answered to the patient's satisfaction.     Prospero Mahnke D

## 2019-01-17 NOTE — Plan of Care (Signed)
Continue current POC 

## 2019-01-17 NOTE — Anesthesia Preprocedure Evaluation (Signed)
Anesthesia Evaluation  Patient identified by MRN, date of birth, ID band Patient awake    Reviewed: Allergy & Precautions, NPO status , Patient's Chart, lab work & pertinent test results  Airway Mallampati: II  TM Distance: >3 FB Neck ROM: Full    Dental  (+) Teeth Intact, Dental Advisory Given   Pulmonary    breath sounds clear to auscultation       Cardiovascular hypertension,  Rhythm:Regular Rate:Normal     Neuro/Psych    GI/Hepatic   Endo/Other  diabetes  Renal/GU      Musculoskeletal   Abdominal   Peds  Hematology   Anesthesia Other Findings   Reproductive/Obstetrics                             Anesthesia Physical Anesthesia Plan  ASA: II  Anesthesia Plan: MAC   Post-op Pain Management:    Induction: Intravenous  PONV Risk Score and Plan: Ondansetron and Dexamethasone  Airway Management Planned:   Additional Equipment:   Intra-op Plan:   Post-operative Plan:   Informed Consent: I have reviewed the patients History and Physical, chart, labs and discussed the procedure including the risks, benefits and alternatives for the proposed anesthesia with the patient or authorized representative who has indicated his/her understanding and acceptance.     Dental advisory given  Plan Discussed with: CRNA and Anesthesiologist  Anesthesia Plan Comments:         Anesthesia Quick Evaluation

## 2019-01-17 NOTE — Anesthesia Postprocedure Evaluation (Signed)
Anesthesia Post Note  Patient: Chloe Harding  Procedure(s) Performed: ESOPHAGOGASTRODUODENOSCOPY (EGD) WITH PROPOFOL (N/A ) SAVORY DILATION (N/A )     Patient location during evaluation: Endoscopy Anesthesia Type: MAC Level of consciousness: awake and alert Pain management: pain level controlled Vital Signs Assessment: post-procedure vital signs reviewed and stable Respiratory status: spontaneous breathing, nonlabored ventilation, respiratory function stable and patient connected to nasal cannula oxygen Cardiovascular status: blood pressure returned to baseline and stable Postop Assessment: no apparent nausea or vomiting Anesthetic complications: no    Last Vitals:  Vitals:   01/17/19 1440 01/17/19 1450  BP: (!) 180/80 (!) 178/82  Pulse: 96 95  Resp: 19 20  Temp:    SpO2: 100% 99%    Last Pain:  Vitals:   01/17/19 1450  TempSrc:   PainSc: 0-No pain                 Natalie Leclaire L Cameryn Schum

## 2019-01-17 NOTE — Transfer of Care (Signed)
Immediate Anesthesia Transfer of Care Note  Patient: Chloe Harding  Procedure(s) Performed: ESOPHAGOGASTRODUODENOSCOPY (EGD) WITH PROPOFOL (N/A ) SAVORY DILATION (N/A )  Patient Location: PACU and Endoscopy Unit  Anesthesia Type:MAC  Level of Consciousness: awake and alert   Airway & Oxygen Therapy: Patient Spontanous Breathing and Patient connected to nasal cannula oxygen  Post-op Assessment: Report given to RN and Post -op Vital signs reviewed and stable  Post vital signs: Reviewed and stable  Last Vitals:  Vitals Value Taken Time  BP    Temp    Pulse    Resp    SpO2      Last Pain:  Vitals:   01/17/19 1319  TempSrc: Oral  PainSc: 0-No pain      Patients Stated Pain Goal: 0 (51/[id number]/41 [id number])  Complications: No apparent anesthesia complications

## 2019-01-17 NOTE — Anesthesia Procedure Notes (Signed)
Procedure Name: MAC Date/Time: 01/17/2019 2:05 PM Performed by: Cynda Familia, CRNA Pre-anesthesia Checklist: Patient identified, Emergency Drugs available, Suction available, Patient being monitored and Timeout performed Oxygen Delivery Method: Nasal cannula Placement Confirmation: positive ETCO2 and breath sounds checked- equal and bilateral Dental Injury: Teeth and Oropharynx as per pre-operative assessment

## 2019-01-17 NOTE — Progress Notes (Signed)
  PROGRESS NOTE  Chloe Harding [id number] DOB: 1946/05/13 DOA: 01/15/2019 PCP: Elenore Paddy, FNP  Brief History   73 year old female no former smoking or drinking dysphagia status post esophageal dilatation [facility]-prior Barrett's esophagus-follows with Dr. 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 intake weight loss solid and liquid dysphagia CT chest/ABD proximal esophageal dilated Tatian + fluid level-thyroid nodule-pleural nodule  A & P  Dysphagia stricture prior Barrett's esophagus prior GI bleed Endoscopy per Dr. Benson [place] as below Clear liquid diet according to them in addition to graduation Twice daily PPI long-term-likely dischargeable when GI feels appropriate TY 2 DM A1c 5.2+ neuropathy + nephropathy-diet-controlled Continue D5/0.9 NS at 50 cc/h Continue gabapentin HTN On lisinopril 10-continue at this time AKI on admission Combined poor p.o. intake plus ACE inhibitor-check a.m. labs Thyroid nodule TSH normal therefore needs biopsy arranged outpatient Pleural nodule Non-smoker-wonder if related to above thyroid issue   DVT prophylaxis: Lovenox Code Status: Full Family Communication: None Disposition Plan: Await input   Verneita Griffes, MD [facility] 3:42 PM  01/17/2019, 3:42 PM  LOS: 1 day    She feels fine today she is ready for the test when I saw her early in the morning no chest pain no fever no chills She is n.p.o.  Consultants  Dr. Collene Mares of GI  Objective   Vitals:  Vitals:   01/17/19 1440 01/17/19 1450  BP: (!) 180/80 (!) 178/82  Pulse: 96 95  Resp: 19 20  Temp:    SpO2: 100% 99%    Exam:  Coherent pleasant no distress no nausea no vomiting EOMI NCAT looking older than stated age Abdomen soft no rebound no guarding No lower extremity edema Neurologically intact No focal  deficit    I have personally reviewed the following:   Today's Data  No labs today Other Data  . None  Scheduled Meds: . amitriptyline  25 mg Oral QHS  . enoxaparin (LOVENOX) injection  40 mg Subcutaneous Q24H  . folic acid  1 mg Oral Daily  . gabapentin  300 mg Oral QHS  . insulin aspart  0-9 Units Subcutaneous TID WC  . lisinopril  10 mg Oral Daily  . pantoprazole (PROTONIX) IV  40 mg Intravenous Q12H  . rOPINIRole  4 mg Oral QHS   Continuous Infusions: . dextrose 5 % and 0.9% NaCl 50 mL/hr at 01/17/19 0542    Active Problems:   Dysphagia   Esophageal stricture   LOS: 1 day   How to contact the [facility] Attending or Consulting provider [place] or covering provider during after hours [place], for this patient?  1. Check the care team in [facility] and look for a) attending/consulting TR[facility] provider listed and b) the [facility] team listed 2. Log into [email or web address] and use [place]'s universal password to access. If you do not have the password, please contact the hospital operator. 3. Locate the [facility] provider you are looking for under [facility] and page to a number that you can be directly reached. 4. If you still have difficulty reaching the provider, please page the [facility] (Director on Call) for the Hospitalists listed on amion for assistance.

## 2019-01-17 NOTE — Op Note (Addendum)
[facility] Patient Name: Mark Schaffter Procedure Date: 01/17/2019 MRN: [id number] Attending MD: Carol Ada , MD Date of Birth: December 09, 1945 CSN: [id number] Age: 73 Admit Type: Inpatient Procedure:                Upper GI endoscopy Indications:              Dysphagia Providers:                Carol Ada, MD, Cleda Daub, RN, William Dalton, Technician Referring MD:              Medicines:                 Complications:            No immediate complications. Estimated Blood Loss:     Estimated blood loss was minimal. Procedure:                Pre-Anesthesia Assessment:                           - Prior to the procedure, a History and Physical                            was performed, and patient medications and                            allergies were reviewed. The patient's tolerance of                            previous anesthesia was also reviewed. The risks                            and benefits of the procedure and the sedation                            options and risks were discussed with the patient.                            All questions were answered, and informed consent                            was obtained. Prior Anticoagulants: The patient has                            taken Lovenox (enoxaparin), last dose was 1 day                            prior to procedure. ASA Grade Assessment: III - A                            patient with severe systemic disease. After                            reviewing the  risks and benefits, the patient was                            deemed in satisfactory condition to undergo the                            procedure.                           - Sedation was administered by an anesthesia                            professional. Deep sedation was attained.                           After obtaining informed consent, the endoscope was                            passed under direct vision.  Throughout the                            procedure, the patient's blood pressure, pulse, and                            oxygen saturations were monitored continuously. The                            [id number] Olympus gastroscope was                            introduced through the mouth, and advanced to the                            second part of duodenum. The upper GI endoscopy was                            technically difficult and complex. The patient                            tolerated the procedure well. Scope In: Scope Out: Findings:      Two benign-appearing, intrinsic severe stenoses were found 22 cm from       the incisors. The narrowest stenosis measured 9 mm (inner diameter) x       less than one cm (in length). The stenoses were traversed after       dilation. A guidewire was placed and the scope was withdrawn. Dilation       was performed with a Savary dilator with no resistance at 12 mm. The       dilation site was examined following endoscope reinsertion and showed       complete resolution of luminal narrowing. Estimated blood loss was       minimal.      A 7 cm hiatal hernia was present.      The stomach was normal.      The examined duodenum was normal.      The procedure was  technically difficult to perform. A severe stricture       was encountered at 22 cm. The esophageal lumen distal to this stricture       was visible and a Savary guidewire was advanced with ease distally.       Measuring the location of the stricture dilation was only performed of       this upper esophageal stricture as distal esophageal pathology was not       able to be visualized. Dilation started with the 8 mm Savary, then the       dilators were sequentially advanced to the 9 mm, and 10 mm dilator.       Reexamination of the stricture was performed and the endoscope was able       to easily pass through the area. A secondary severe stricture was       encountered at 30 cm.  The endoscope was not able to pass through the       area. Dilation of the area commenced with the 9 mm Savary and then       sequentially using the 10 mm, and 11 mm dilator. Reinspection showed a       good response as the endoscope moved through the area. Visualization of       the guidewire was obtained confirming that the initial pass of the       guidewire was into the gastric lumen. Examination of the dilation sites       showed that further dilation was possible up to 12 mm. This dilation was       performed without difficulty and a relook endoscopy showed an excellent       response. There was no evidence of any crepitus during the dilation of       the strictures. Impression:               - Benign-appearing esophageal stenoses. Dilated.                           - 7 cm hiatal hernia.                           - Normal stomach.                           - Normal examined duodenum.                           - No specimens collected. Moderate Sedation:      Not Applicable - Patient had care per Anesthesia. Recommendation:           - Return patient to hospital ward for ongoing care.                           - Clear liquid diet.                           - Continue present medications.                           - Repeat upper endoscopy in 2 weeks for retreatment.                           -  PPI BID indefinitely. Procedure Code(s):        --- Professional ---                           [phone], Esophagogastroduodenoscopy, flexible,                            transoral; with insertion of guide wire followed by                            passage of dilator(s) through esophagus over guide                            wire Diagnosis Code(s):        --- Professional ---                           K22.2, Esophageal obstruction                           K44.9, Diaphragmatic hernia without obstruction or                            gangrene                           R13.10, Dysphagia,  unspecified CPT copyright 2019 [employer]. All rights reserved. The codes documented in this report are preliminary and upon coder review may  be revised to meet current compliance requirements. Carol Ada, MD Carol Ada, MD 01/17/2019 2:31:21 PM This report has been signed electronically. Number of Addenda: 0

## 2019-01-18 ENCOUNTER — Encounter (HOSPITAL_COMMUNITY): Payer: Self-pay | Admitting: Gastroenterology

## 2019-01-18 LAB — RENAL FUNCTION PANEL
Albumin: 2.9 g/dL — ABNORMAL LOW (ref 3.5–5.0)
Anion gap: 7 (ref 5–15)
BUN: 5 mg/dL — ABNORMAL LOW (ref 8–23)
CO2: 25 mmol/L (ref 22–32)
Calcium: 7.9 mg/dL — ABNORMAL LOW (ref 8.9–10.3)
Chloride: 108 mmol/L (ref 98–111)
Creatinine, Ser: 0.63 mg/dL (ref 0.44–1.00)
GFR calc Af Amer: >60 mL/min (ref >60)
GFR calc non Af Amer: >60 mL/min (ref >60)
Glucose, Bld: 121 mg/dL — ABNORMAL HIGH (ref 70–99)
Phosphorus: 2.7 mg/dL (ref 2.5–4.6)
Potassium: 3 mmol/L — ABNORMAL LOW (ref 3.5–5.1)
Sodium: 140 mmol/L (ref 135–145)

## 2019-01-18 LAB — GLUCOSE, CAPILLARY
Glucose-Capillary: 104 mg/dL — ABNORMAL HIGH (ref 70–99)
Glucose-Capillary: 97 mg/dL (ref 70–99)

## 2019-01-18 MED ORDER — PANTOPRAZOLE SODIUM 40 MG PO TBEC: 40.0000 mg | DELAYED_RELEASE_TABLET | Freq: Two times a day (BID) | ORAL | 0 refills | Status: DC

## 2019-01-18 MED ORDER — PANTOPRAZOLE SODIUM 40 MG PO TBEC: 40.0000 mg | DELAYED_RELEASE_TABLET | Freq: Two times a day (BID) | ORAL | Status: DC

## 2019-01-18 NOTE — Progress Notes (Signed)
Spoke with Dr.Patrick Benson [place], who stated for patient to follow up in 1 week in his office. He did not need to see patient prior to discharge today is she is tolerating diet, swallowing with no difficulties and having no pain. Paged Dr.Nettey to make him aware.

## 2019-01-18 NOTE — TOC Transition Note (Signed)
Transition of Care [facility]) - CM/SW Discharge Note   Patient Details  Name: Chloe Harding MRN: [id number] Date of Birth: [birth year]  Transition of Care [facility]) CM/SW Contact:  Leeroy Cha, RN Phone Number: 01/18/2019, 9:46 AM   Clinical Narrative:    dcd to home with self care.   Final next level of care: Home/Self Care Barriers to Discharge: No Barriers Identified   Patient Goals and CMS Choice Patient states their goals for this hospitalization and ongoing recovery are:: just  to  go honme CMS Medicare.gov Compare Post Acute Care list provided to:: Patient    Discharge Placement                       Discharge Plan and Services                                     Social Determinants of Health (SDOH) Interventions     Readmission Risk Interventions No flowsheet data found.

## 2019-01-18 NOTE — Discharge Summary (Signed)
Physician Discharge Summary  ZELDA DENK [id number] DOB: Dec 05, 1945 DOA: 01/15/2019  PCP: Elenore Paddy, FNP  Admit date: 01/15/2019 Discharge date: 01/18/2019  Admitted From: Home Disposition: Home  Recommendations for Outpatient Follow-up:  1. Follow up with PCP in 1 week 2. Follow-up with GI in 1 week 3. Please obtain BMP/CBC in one week 4. Follow-up on thyroid/pleural nodules 5. Please follow up on the following pending results: None  Home Health: None Equipment/Devices: None  Discharge Condition: Stable CODE STATUS: Full code Diet recommendation: Soft diet  Brief/Interim Summary:  Admission HPI written by Hosie Poisson, MD   Chief Complaint: difficulty and trouble with oral intake since 2 months.   HPI: Chloe Harding is a 73 y.o. female with medical history significant of hypertension, diabetes, depression, hiatal hernia, hyperlipidemia history of dysphagia about 15 years ago status post esophageal dilatation at [facility], presents today with complaints of difficulty taking both solids and liquids since 2 months she reports food stays in her esophagus and she has associated vomiting and sometimes spitting up food, associated with weight loss from 170 to 157 pounds in the last 2 months.  She denies having any fevers chills, shortness of breath, chest pain or cough.  She denies any melena or hematochezia.  She is complaining of dizziness the last 1 week but no episodes of syncope or headache or tingling or numbness in her hands or feet, blurry vision.  She denies any dysuria. She reports having similar issues with swallowing about 15 years ago and had esophageal dilatation done at [facility].  She also had recent EGD and colonoscopy in 2019 by Dr. Collene Mares.    ED Course: on arrival to ED, she was afebrile, hypertensive with bp 179/92 mmhg, lab work significant for 1.42, hemoglobin of 11.9. CT abdomen and pelvis and chest without contrast showed Moderate proximal  esophageal dilatation with fluid level within.Transition in the lower esophagus to more mildly dilated suggestingeither benign or malignant stricture. This is in the region of a stricture on 05/31/2011 esophagram.    Hospital course:  Dysphasia Secondary to stricture.  Patient had upper endoscopy performed with dilation.  Esophageal stenosis described as benign appearing. Endoscopy also significant for a 7 cm hiatal hernia.  GI recommending outpatient follow-up in 1 week with repeat dilation.  Recommendations to continue PPI twice daily indefinitely.  Diabetes mellitus type 2 Diet controlled.  AKI Baseline creatinine of 0.7.  Patient with a creatinine of 1.42 on admission.  Patient treated with IV fluids with resolution of AKI.  Thyroid nodule Normal TSH.  Also noted to have a right apical pleural based 4 mm nodule.  Recommend outpatient follow-up.  Discharge Diagnoses:  Active Problems:   Dysphagia   Esophageal stricture    Discharge Instructions   Allergies as of 01/18/2019      Reactions   Amlodipine Besylate Swelling   swelling      Medication List    STOP taking these medications   meloxicam 15 MG tablet Commonly known as: MOBIC   omeprazole 40 MG capsule Commonly known as: PRILOSEC     TAKE these medications   amitriptyline 25 MG tablet Commonly known as: ELAVIL Take 25 mg by mouth at bedtime.   folic acid 1 MG tablet Commonly known as: FOLVITE Take 1 mg by mouth daily.   gabapentin 300 MG capsule Commonly known as: NEURONTIN Take 300 mg by mouth at bedtime. What changed: Another medication with the same name was removed. Continue taking this  medication, and follow the directions you see here.   HYDROcodone-acetaminophen 5-325 MG tablet Commonly known as: NORCO/VICODIN Take 1 tablet by mouth every 6 (six) hours as needed for moderate pain.   lisinopril 10 MG tablet Commonly known as: ZESTRIL Take 10 mg by mouth daily.   pantoprazole 40 MG  tablet Commonly known as: Protonix Take 1 tablet (40 mg total) by mouth 2 (two) times daily.   rOPINIRole 4 MG tablet Commonly known as: REQUIP Take 4 mg by mouth at bedtime.      Follow-up Information    Elenore Paddy, FNP. Schedule an appointment as soon as possible for a visit in 1 week(s).   Specialty: Family Medicine Why: Hospital follow-up Contact information: [place] [address] [phone]        Carol Ada, MD. Schedule an appointment as soon as possible for a visit in 1 week(s).   Specialty: Gastroenterology Contact information: [place], [place] [address] [phone]          Allergies  Allergen Reactions   Amlodipine Besylate Swelling    swelling    Consultations:  Gastroenterology   Procedures/Studies: Ct Abdomen Pelvis Wo Contrast  Result Date: 01/15/2019 CLINICAL DATA:  Dysphagia. Mild abdominal pain. Possible esophageal stricture. EXAM: CT CHEST, ABDOMEN AND PELVIS WITHOUT CONTRAST TECHNIQUE: Multidetector CT imaging of the chest, abdomen and pelvis was performed following the standard protocol without IV contrast. COMPARISON:  Chest radiograph 10/02/2017. Esophagram of 05/21/2011. Prior abdominopelvic CT 12/12/2009 FINDINGS: CT CHEST FINDINGS Cardiovascular: Aortic and branch vessel atherosclerosis. Tortuous thoracic aorta. Mild cardiomegaly, without pericardial effusion. Multivessel coronary artery atherosclerosis. Mediastinum/Nodes: Right-sided thyroid nodule of 2.2 cm on 01/02. No mediastinal or definite hilar adenopathy, given limitations of unenhanced CT. Small hiatal hernia. The esophagus is moderately dilated and fluid-filled. This continues to the level of a transition to more mildly dilated esophagus. Example transition site on coronal image 85. This is in the same vicinity of the stricture on the esophagram of 05/21/2011. Lungs/Pleura: No pleural fluid. Moderate right hemidiaphragm  elevation. Right lower lobe adjacent volume loss and atelectasis. Right apical pleural-based pulmonary nodule of 4 mm on 21/4. Musculoskeletal: Mild T11 compression deformity with no ventral canal encroachment. CT ABDOMEN PELVIS FINDINGS Hepatobiliary: Normal liver. Cholecystectomy, without biliary ductal dilatation. Pancreas: Normal, without mass or ductal dilatation. Spleen: Normal in size, without focal abnormality. Adrenals/Urinary Tract: Normal adrenal glands. Mild renal cortical thinning bilaterally. Normal urinary bladder. Stomach/Bowel: Equivocal fold thickening involving the gastric body on 46/2. Periampullary duodenal diverticulum. Otherwise normal small bowel. Scattered colonic diverticula. Normal terminal ileum. Vascular/Lymphatic: Advanced aortic and branch vessel atherosclerosis. No abdominopelvic adenopathy. Reproductive: Normal uterus and adnexa. Other: No significant free fluid.  Moderate pelvic floor laxity. Musculoskeletal: Osteopenia. Disc bulge at the lumbosacral junction. IMPRESSION: 1. Moderate proximal esophageal dilatation with fluid level within. Transition in the lower esophagus to more mildly dilated suggesting either benign or malignant stricture. This is in the region of a stricture on 05/31/2011 esophagram. 2. Right thyroid nodule which should be considered for nonemergent ultrasound. This follows ACR consensus guidelines: Managing Incidental Thyroid Nodules Detected on Imaging: White Paper of the ACR Incidental Thyroid Findings Committee. J Am Coll Radiol 2015; 12:143-150. 3. Coronary artery atherosclerosis. Aortic Atherosclerosis (ICD10-I70.0). 4.  Tiny hiatal hernia. 5. Equivocal gastric fold thickening as can be seen with gastritis. 6. Right apical pleural-based 4 mm nodule. No follow-up needed if patient is low-risk. Non-contrast chest CT can be considered in 12 months if patient is high-risk. This  recommendation follows the consensus statement: Guidelines for Management of  Incidental Pulmonary Nodules Detected on CT Images: From the Fleischner Society 2017; Radiology 2017; 284:228-243. Electronically Signed   By: Abigail Miyamoto M.D.   On: 01/15/2019 05:41   Ct Chest Wo Contrast  Result Date: 01/15/2019 CLINICAL DATA:  Dysphagia. Mild abdominal pain. Possible esophageal stricture. EXAM: CT CHEST, ABDOMEN AND PELVIS WITHOUT CONTRAST TECHNIQUE: Multidetector CT imaging of the chest, abdomen and pelvis was performed following the standard protocol without IV contrast. COMPARISON:  Chest radiograph 10/02/2017. Esophagram of 05/21/2011. Prior abdominopelvic CT 12/12/2009 FINDINGS: CT CHEST FINDINGS Cardiovascular: Aortic and branch vessel atherosclerosis. Tortuous thoracic aorta. Mild cardiomegaly, without pericardial effusion. Multivessel coronary artery atherosclerosis. Mediastinum/Nodes: Right-sided thyroid nodule of 2.2 cm on 01/02. No mediastinal or definite hilar adenopathy, given limitations of unenhanced CT. Small hiatal hernia. The esophagus is moderately dilated and fluid-filled. This continues to the level of a transition to more mildly dilated esophagus. Example transition site on coronal image 85. This is in the same vicinity of the stricture on the esophagram of 05/21/2011. Lungs/Pleura: No pleural fluid. Moderate right hemidiaphragm elevation. Right lower lobe adjacent volume loss and atelectasis. Right apical pleural-based pulmonary nodule of 4 mm on 21/4. Musculoskeletal: Mild T11 compression deformity with no ventral canal encroachment. CT ABDOMEN PELVIS FINDINGS Hepatobiliary: Normal liver. Cholecystectomy, without biliary ductal dilatation. Pancreas: Normal, without mass or ductal dilatation. Spleen: Normal in size, without focal abnormality. Adrenals/Urinary Tract: Normal adrenal glands. Mild renal cortical thinning bilaterally. Normal urinary bladder. Stomach/Bowel: Equivocal fold thickening involving the gastric body on 46/2. Periampullary duodenal diverticulum.  Otherwise normal small bowel. Scattered colonic diverticula. Normal terminal ileum. Vascular/Lymphatic: Advanced aortic and branch vessel atherosclerosis. No abdominopelvic adenopathy. Reproductive: Normal uterus and adnexa. Other: No significant free fluid.  Moderate pelvic floor laxity. Musculoskeletal: Osteopenia. Disc bulge at the lumbosacral junction. IMPRESSION: 1. Moderate proximal esophageal dilatation with fluid level within. Transition in the lower esophagus to more mildly dilated suggesting either benign or malignant stricture. This is in the region of a stricture on 05/31/2011 esophagram. 2. Right thyroid nodule which should be considered for nonemergent ultrasound. This follows ACR consensus guidelines: Managing Incidental Thyroid Nodules Detected on Imaging: White Paper of the ACR Incidental Thyroid Findings Committee. J Am Coll Radiol 2015; 12:143-150. 3. Coronary artery atherosclerosis. Aortic Atherosclerosis (ICD10-I70.0). 4.  Tiny hiatal hernia. 5. Equivocal gastric fold thickening as can be seen with gastritis. 6. Right apical pleural-based 4 mm nodule. No follow-up needed if patient is low-risk. Non-contrast chest CT can be considered in 12 months if patient is high-risk. This recommendation follows the consensus statement: Guidelines for Management of Incidental Pulmonary Nodules Detected on CT Images: From the Fleischner Society 2017; Radiology 2017; 284:228-243. Electronically Signed   By: Abigail Miyamoto M.D.   On: 01/15/2019 05:41   US Thyroid  Result Date: 01/15/2019 CLINICAL DATA:  Incidental on CT. 74 year old female with right-sided thyroid nodule seen on recent CT scan EXAM: THYROID ULTRASOUND TECHNIQUE: Ultrasound examination of the thyroid gland and adjacent soft tissues was performed. COMPARISON:  CT scan of the chest obtained earlier today FINDINGS: Parenchymal Echotexture: Mildly heterogenous Isthmus: 2.4 cm Right lobe: 4.3 x 2.7 x 3.0 cm Left lobe: 4.5 x 1.3 x 1.1 cm  _________________________________________________________ Estimated total number of nodules >/= 1 cm: 3 Number of spongiform nodules >/=  2 cm not described below (TR1): 0 Number of mixed cystic and solid nodules >/= 1.5 cm not described below ([place]): 0 _________________________________________________________ Nodule # 1: Location: Right; Mid  Maximum size: 2.6 cm; Other 2 dimensions: 2.3 x 2.5 cm Composition: solid/almost completely solid (2) Echogenicity: isoechoic (1) Shape: not taller-than-wide (0) Margins: smooth (0) Echogenic foci: none (0) ACR TI-RADS total points: 3. ACR TI-RADS risk category: TR3 (3 points). ACR TI-RADS recommendations: **Given size (>/= 2.5 cm) and appearance, fine needle aspiration of this mildly suspicious nodule should be considered based on TI-RADS criteria. _________________________________________________________ Nodule # 2: Location: Right; Mid Maximum size: 1.4 cm; Other 2 dimensions: 1.3 x 1.4 cm Composition: solid/almost completely solid (2) Echogenicity: hypoechoic (2) Shape: not taller-than-wide (0) Margins: ill-defined (0) Echogenic foci: none (0) ACR TI-RADS total points: 4. ACR TI-RADS risk category: TR4 (4-6 points). ACR TI-RADS recommendations: *Given size (>/= 1 - 1.4 cm) and appearance, a follow-up ultrasound in 1 year should be considered based on TI-RADS criteria. _________________________________________________________ Nodule # 3: Small isoechoic solid nodule in the left lower gland measures no more than 1.0 cm. This nodule does not meet criteria for further evaluation. _________________________________________________________ IMPRESSION: 1. Approximately 2.6 cm TI-RADS category 3 (mildly suspicious) nodule in the right mid gland meets criteria for consideration of fine-needle aspiration biopsy. 2. An adjacent 1.4 cm TI-RADS category 4 nodule also in the right mid gland meets criteria for follow-up ultrasound in 1 year. The above is in keeping with the ACR TI-RADS  recommendations - J Am Coll Radiol 2017;14:587-595. Electronically Signed   By: Jacqulynn Cadet M.D.   On: 01/15/2019 11:15     EGD (9/1) Findings:      Two benign-appearing, intrinsic severe stenoses were found 22 cm from       the incisors. The narrowest stenosis measured 9 mm (inner diameter) x       less than one cm (in length). The stenoses were traversed after       dilation. A guidewire was placed and the scope was withdrawn. Dilation       was performed with a Savary dilator with no resistance at 12 mm. The       dilation site was examined following endoscope reinsertion and showed       complete resolution of luminal narrowing. Estimated blood loss was       minimal.      A 7 cm hiatal hernia was present.      The stomach was normal.      The examined duodenum was normal.      The procedure was technically difficult to perform. A severe stricture       was encountered at 22 cm. The esophageal lumen distal to this stricture       was visible and a Savary guidewire was advanced with ease distally.       Measuring the location of the stricture dilation was only performed of       this upper esophageal stricture as distal esophageal pathology was not       able to be visualized. Dilation started with the 8 mm Savary, then the       dilators were sequentially advanced to the 9 mm, and 10 mm dilator.       Reexamination of the stricture was performed and the endoscope was able       to easily pass through the area. A secondary severe stricture was       encountered at 30 cm. The endoscope was not able to pass through the       area. Dilation of the area commenced with the 9 mm Savary and then  sequentially using the 10 mm, and 11 mm dilator. Reinspection showed a       good response as the endoscope moved through the area. Visualization of       the guidewire was obtained confirming that the initial pass of the       guidewire was into the gastric lumen. Examination of the  dilation sites       showed that further dilation was possible up to 12 mm. This dilation was       performed without difficulty and a relook endoscopy showed an excellent       response. There was no evidence of any crepitus during the dilation of       the strictures. Impression:               - Benign-appearing esophageal stenoses. Dilated.                           - 7 cm hiatal hernia.                           - Normal stomach.                           - Normal examined duodenum.                           - No specimens collected. Moderate Sedation:      Not Applicable - Patient had care per Anesthesia. Recommendation:           - Return patient to hospital ward for ongoing care.                           - Clear liquid diet.                           - Continue present medications.                           - Repeat upper endoscopy in 2 weeks for retreatment.                           - PPI BID indefinitely.   Subjective: Patient reports tolerating her diet.  No significant dysphagia.  Discharge Exam: Vitals:   01/18/19 0834 01/18/19 1253  BP: (!) 159/90 (!) 160/82  Pulse:  90  Resp:  16  Temp:  98.5 F (36.9 C)  SpO2:  100%   Vitals:   01/17/19 1613 01/18/19 0500 01/18/19 0834 01/18/19 1253  BP:  (!) 160/94 (!) 159/90 (!) 160/82  Pulse: 90 (!) 104  90  Resp: 16 17  16   Temp: 98.7 F (37.1 C) 98.1 F (36.7 C)  98.5 F (36.9 C)  TempSrc: Oral Oral  Oral  SpO2: 98% 99%  100%  Weight:      Height:        General: Pt is alert, awake, not in acute distress night, he has is placed Cardiovascular: RRR, S1/S2 +, no rubs, no gallops Respiratory: CTA bilaterally, no wheezing, no rhonchi Abdominal: Soft, NT, ND, bowel sounds + Extremities: no edema, no cyanosis    The results of significant diagnostics from this  hospitalization (including imaging, microbiology, ancillary and laboratory) are listed below for reference.     Microbiology: Recent Results (from the  past 240 hour(s))  SARS Coronavirus 2 [facility] order, Performed in [facility] hospital lab) Nasopharyngeal Nasopharyngeal Swab     Status: None   Collection Time: 01/16/19 12:06 PM   Specimen: Nasopharyngeal Swab  Result Value Ref Range Status   SARS Coronavirus 2 NEGATIVE NEGATIVE Final    Comment: (NOTE) If result is NEGATIVE SARS-CoV-2 target nucleic acids are NOT DETECTED. The SARS-CoV-2 RNA is generally detectable in upper and lower  respiratory specimens during the acute phase of infection. The lowest  concentration of SARS-CoV-2 viral copies this assay can detect is 250  copies / mL. A negative result does not preclude SARS-CoV-2 infection  and should not be used as the sole basis for treatment or other  patient management decisions.  A negative result may occur with  improper specimen collection / handling, submission of specimen other  than nasopharyngeal swab, presence of viral mutation(s) within the  areas targeted by this assay, and inadequate number of viral copies  (<250 copies / mL). A negative result must be combined with clinical  observations, patient history, and epidemiological information. If result is POSITIVE SARS-CoV-2 target nucleic acids are DETECTED. The SARS-CoV-2 RNA is generally detectable in upper and lower  respiratory specimens dur ing the acute phase of infection.  Positive  results are indicative of active infection with SARS-CoV-2.  Clinical  correlation with patient history and other diagnostic information is  necessary to determine patient infection status.  Positive results do  not rule out bacterial infection or co-infection with other viruses. If result is PRESUMPTIVE POSTIVE SARS-CoV-2 nucleic acids MAY BE PRESENT.   A presumptive positive result was obtained on the submitted specimen  and confirmed on repeat testing.  While 2019 novel coronavirus  (SARS-CoV-2) nucleic acids may be present in the submitted sample  additional confirmatory  testing may be necessary for epidemiological  and / or clinical management purposes  to differentiate between  SARS-CoV-2 and other Sarbecovirus currently known to infect humans.  If clinically indicated additional testing with an alternate test  methodology [phone]) is advised. The SARS-CoV-2 RNA is generally  detectable in upper and lower respiratory sp ecimens during the acute  phase of infection. The expected result is Negative. Fact Sheet for Patients:  StrictlyIdeas.no Fact Sheet for Healthcare Providers: BankingDealers.co.za This test is not yet approved or cleared by the [place] FDA and has been authorized for detection and/or diagnosis of SARS-CoV-2 by FDA under an Emergency Use Authorization (EUA).  This EUA will remain in effect (meaning this test can be used) for the duration of the COVID-19 declaration under Section 564(b)(1) of the Act, 21 U.S.C. section 360bbb-3(b)(1), unless the authorization is terminated or revoked sooner. Performed at [facility], [place] [address], [place], [place] [address]      Labs: BNP (last 3 results) No results for input(s): BNP in the last 8760 hours. Basic Metabolic Panel: Recent Labs  Lab 01/15/19 0126 01/16/19 0320 01/18/19 0221  NA 144 143 140  K 3.5 3.6 3.0*  CL 111 115* 108  CO2 25 23 25   GLUCOSE 129* 148* 121*  BUN 19 8 5*  CREATININE 1.42* 0.71 0.63  CALCIUM 9.1 8.1* 7.9*  PHOS  --   --  2.7   Liver Function Tests: Recent Labs  Lab 01/15/19 0126 01/18/19 0221  AST 24  --   ALT 12  --  ALKPHOS 77  --   BILITOT 0.7  --   PROT 7.2  --   ALBUMIN 3.7 2.9*   No results for input(s): LIPASE, AMYLASE in the last 168 hours. No results for input(s): AMMONIA in the last 168 hours. CBC: Recent Labs  Lab 01/15/19 0126 01/16/19 0320  WBC 5.3 3.9*  NEUTROABS 2.8  --   HGB 11.9* 10.9*  HCT 37.2 33.3*  MCV 94.2 93.3  PLT 199 159   Cardiac  Enzymes: No results for input(s): CKTOTAL, CKMB, CKMBINDEX, TROPONINI in the last 168 hours. BNP: Invalid input(s): POCBNP CBG: Recent Labs  Lab 01/17/19 1128 01/17/19 1616 01/17/19 2056 01/18/19 0739 01/18/19 1135  GLUCAP 102* 86 142* 104* 97   D-Dimer No results for input(s): DDIMER in the last 72 hours. Hgb A1c No results for input(s): HGBA1C in the last 72 hours. Lipid Profile No results for input(s): CHOL, HDL, LDLCALC, TRIG, CHOLHDL, LDLDIRECT in the last 72 hours. Thyroid function studies No results for input(s): TSH, T4TOTAL, T3FREE, THYROIDAB in the last 72 hours.  Invalid input(s): FREET3 Anemia work up No results for input(s): VITAMINB12, FOLATE, FERRITIN, TIBC, IRON, RETICCTPCT in the last 72 hours. Urinalysis    Component Value Date/Time   COLORURINE STRAW (A) 01/15/2019 0955   APPEARANCEUR CLEAR 01/15/2019 0955   LABSPEC 1.005 01/15/2019 0955   PHURINE 6.0 01/15/2019 0955   GLUCOSEU NEGATIVE 01/15/2019 0955   HGBUR NEGATIVE 01/15/2019 0955   HGBUR trace-intact 05/14/2010 1402   BILIRUBINUR NEGATIVE 01/15/2019 [place] 01/15/2019 0955   PROTEINUR NEGATIVE 01/15/2019 0955   UROBILINOGEN 1.0 03/30/2015 1540   NITRITE NEGATIVE 01/15/2019 0955   LEUKOCYTESUR NEGATIVE 01/15/2019 0955   Sepsis Labs Invalid input(s): PROCALCITONIN,  WBC,  LACTICIDVEN Microbiology Recent Results (from the past 240 hour(s))  SARS Coronavirus 2 [facility] order, Performed in [facility] hospital lab) Nasopharyngeal Nasopharyngeal Swab     Status: None   Collection Time: 01/16/19 12:06 PM   Specimen: Nasopharyngeal Swab  Result Value Ref Range Status   SARS Coronavirus 2 NEGATIVE NEGATIVE Final    Comment: (NOTE) If result is NEGATIVE SARS-CoV-2 target nucleic acids are NOT DETECTED. The SARS-CoV-2 RNA is generally detectable in upper and lower  respiratory specimens during the acute phase of infection. The lowest  concentration of SARS-CoV-2 viral copies  this assay can detect is 250  copies / mL. A negative result does not preclude SARS-CoV-2 infection  and should not be used as the sole basis for treatment or other  patient management decisions.  A negative result may occur with  improper specimen collection / handling, submission of specimen other  than nasopharyngeal swab, presence of viral mutation(s) within the  areas targeted by this assay, and inadequate number of viral copies  (<250 copies / mL). A negative result must be combined with clinical  observations, patient history, and epidemiological information. If result is POSITIVE SARS-CoV-2 target nucleic acids are DETECTED. The SARS-CoV-2 RNA is generally detectable in upper and lower  respiratory specimens dur ing the acute phase of infection.  Positive  results are indicative of active infection with SARS-CoV-2.  Clinical  correlation with patient history and other diagnostic information is  necessary to determine patient infection status.  Positive results do  not rule out bacterial infection or co-infection with other viruses. If result is PRESUMPTIVE POSTIVE SARS-CoV-2 nucleic acids MAY BE PRESENT.   A presumptive positive result was obtained on the submitted specimen  and confirmed on repeat testing.  While  2019 novel coronavirus  (SARS-CoV-2) nucleic acids may be present in the submitted sample  additional confirmatory testing may be necessary for epidemiological  and / or clinical management purposes  to differentiate between  SARS-CoV-2 and other Sarbecovirus currently known to infect humans.  If clinically indicated additional testing with an alternate test  methodology [phone]) is advised. The SARS-CoV-2 RNA is generally  detectable in upper and lower respiratory sp ecimens during the acute  phase of infection. The expected result is Negative. Fact Sheet for Patients:  StrictlyIdeas.no Fact Sheet for Healthcare  Providers: BankingDealers.co.za This test is not yet approved or cleared by the [place] FDA and has been authorized for detection and/or diagnosis of SARS-CoV-2 by FDA under an Emergency Use Authorization (EUA).  This EUA will remain in effect (meaning this test can be used) for the duration of the COVID-19 declaration under Section 564(b)(1) of the Act, 21 U.S.C. section 360bbb-3(b)(1), unless the authorization is terminated or revoked sooner. Performed at [facility], [place] [address] [place], [place] [address]      Time coordinating discharge: 35 minutes  SIGNED:   Cordelia Poche, MD [facility] 01/18/2019, 3:10 PM

## 2019-01-18 NOTE — Discharge Instructions (Signed)
Sammuel Hines,  You were in the hospital because of dysphagia. You had your esophagus stretched. Please follow-up with the GI physician for follow-up.

## 2019-01-18 NOTE — Progress Notes (Signed)
Reviewed discharge instructions and medications with patient. The use of PPI twice a day and when to reach out to GI Dr.Hung. Patient verbalizes understanding. Husband on the way from [facility].

## 2019-04-08 IMAGING — CR DG ANKLE COMPLETE 3+V*L*
3 series · 3 of 3 positions shown · non-contrast
Comparison: None.

CLINICAL DATA: Generalized bilat lower extremity weakness over last
2 days and thinks she rolled lt ankle while trying to stand at home
complains of generalized pain to lt ankle,

EXAM:
LEFT ANKLE COMPLETE - 3+ VIEW

[x ankle ap left]
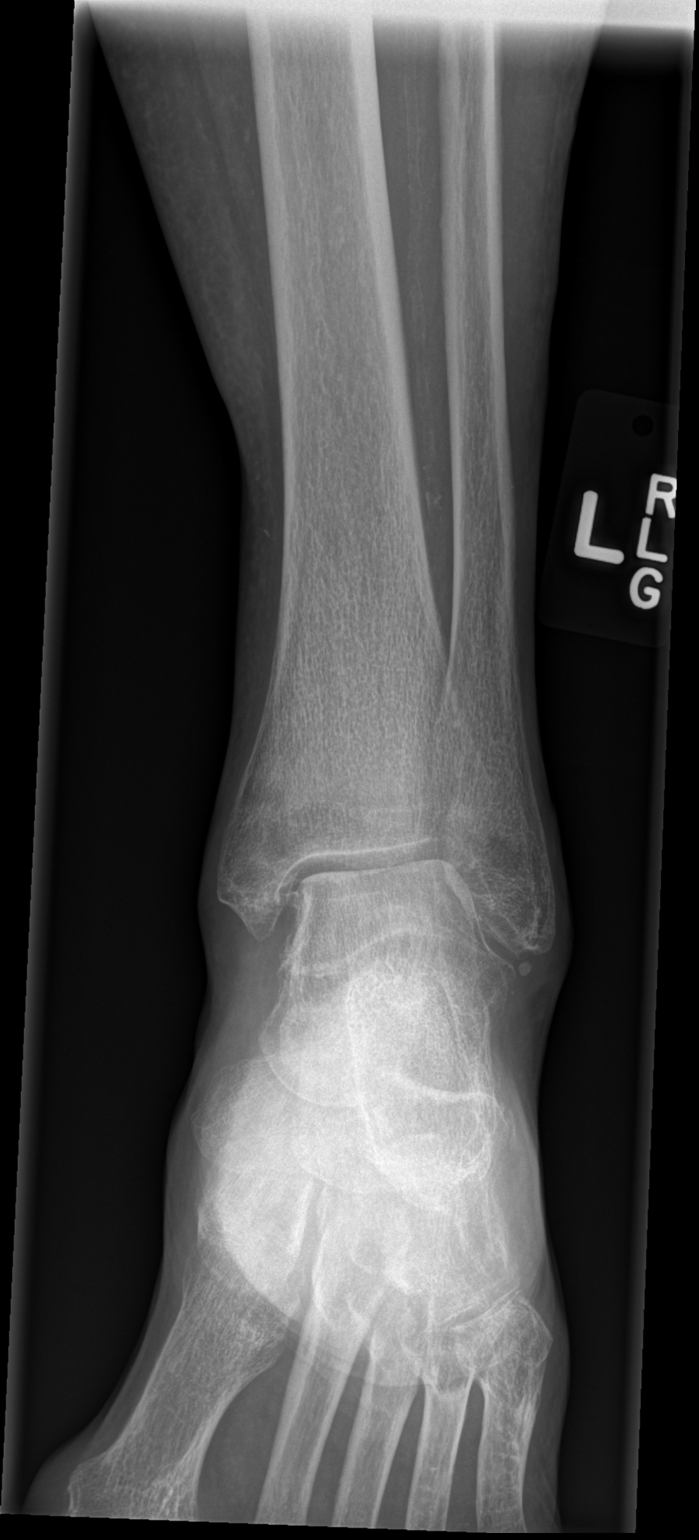

[x ankle obl left]
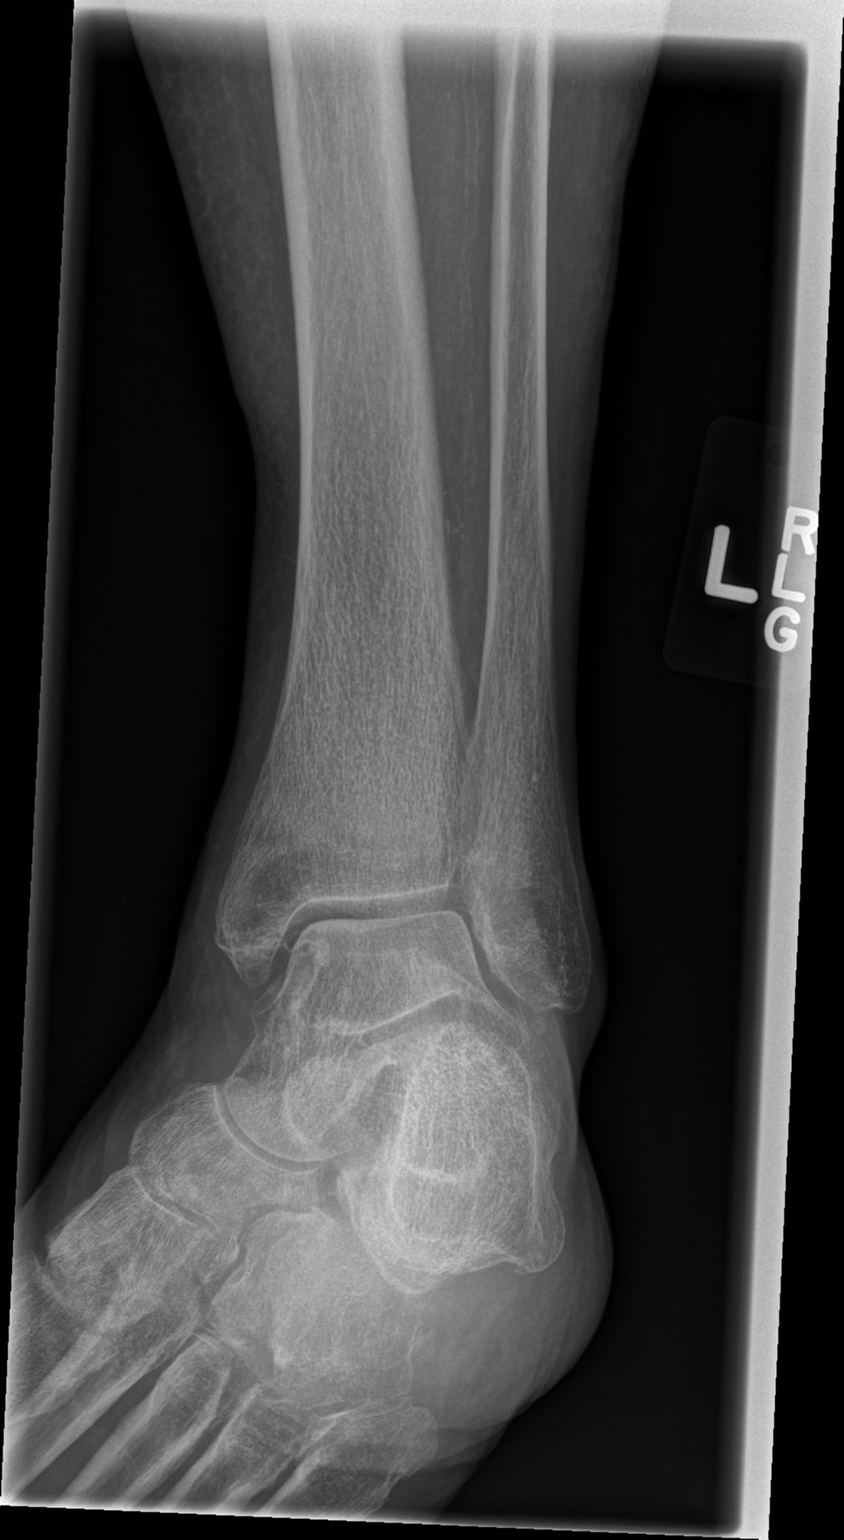

[x ankle lat left]
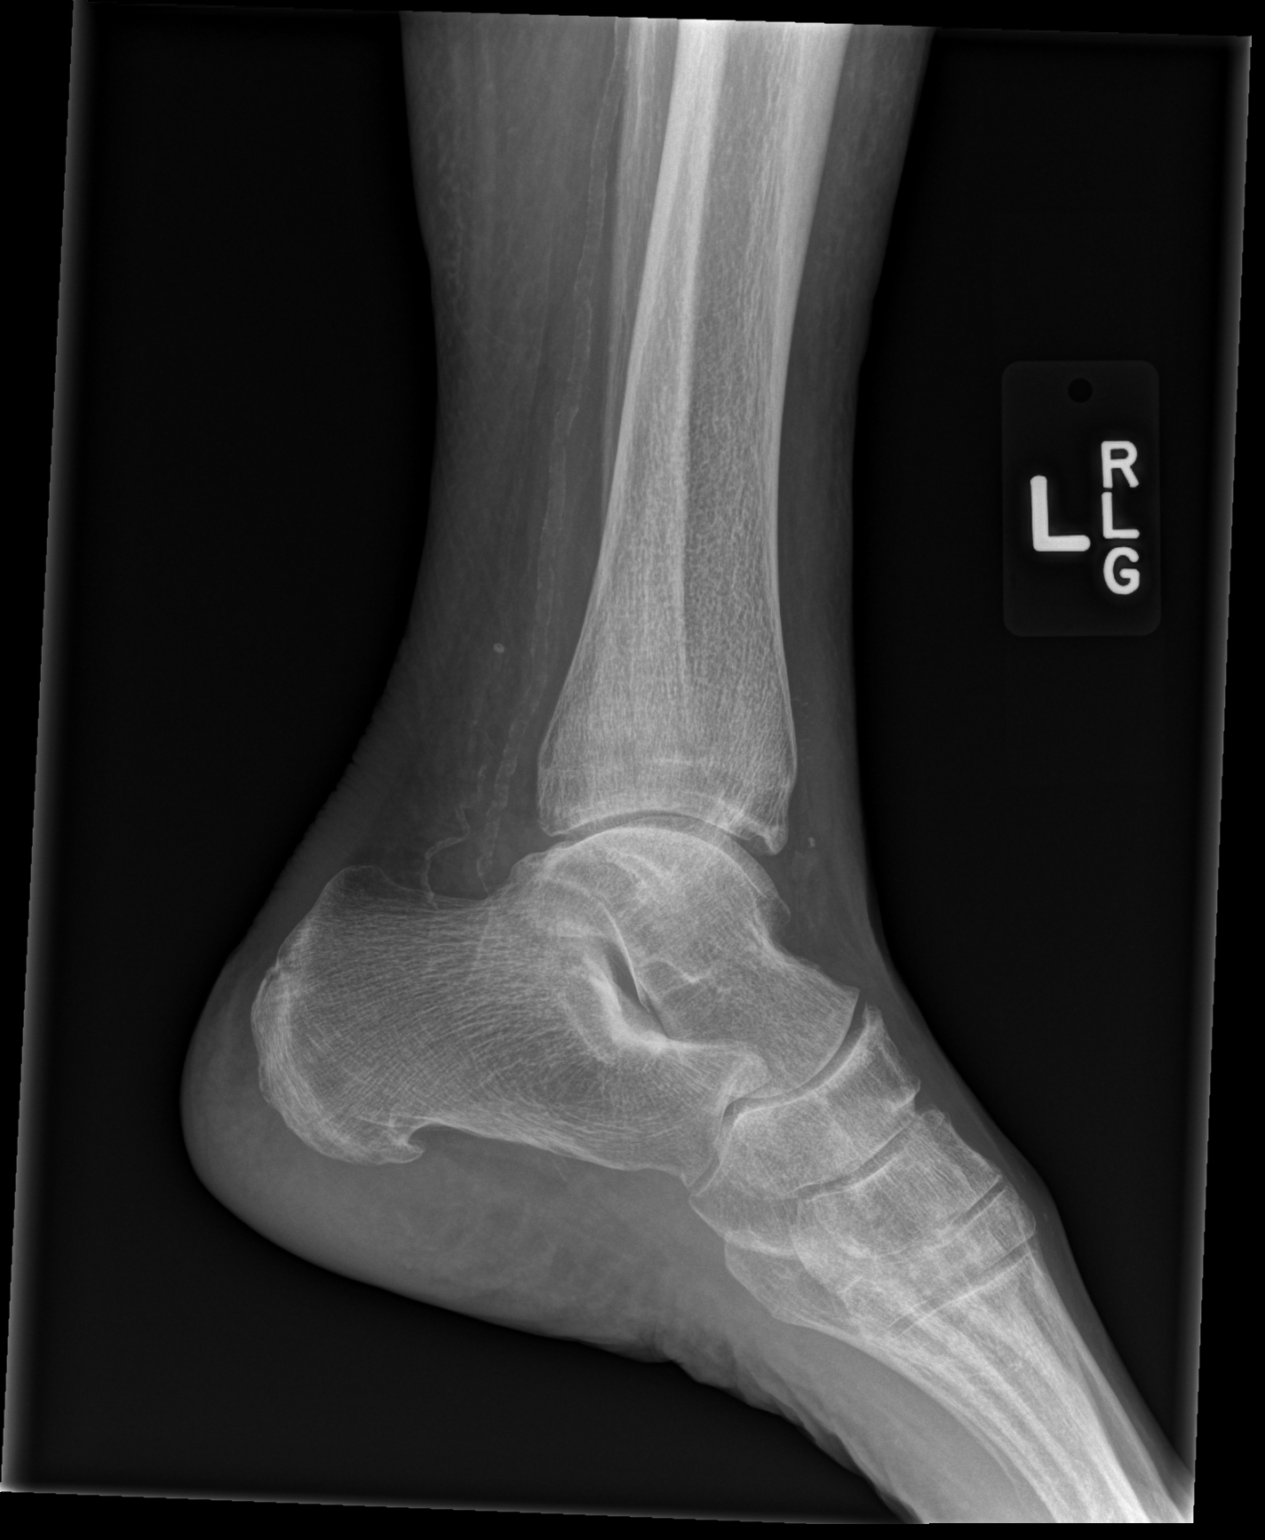

[3 of 3 positions shown; findings below may reference images not displayed]

FINDINGS: Generalized osteopenia.

No acute fracture or dislocation. No lytic or sclerotic osseous
lesion. Ankle mortise is intact. Small plantar calcaneal spur. No
soft tissue abnormality. Peripheral vascular atherosclerotic
disease.
IMPRESSION: No acute osseous injury of the left ankle.

## 2019-05-19 IMAGING — CR DG CHEST 2V
2 series · 2 of 2 positions shown · non-contrast
Comparison: 12/29/2015

CLINICAL DATA: Fall 2 weeks ago.  Confusion.

EXAM:
CHEST  2 VIEW

[w chest lat]
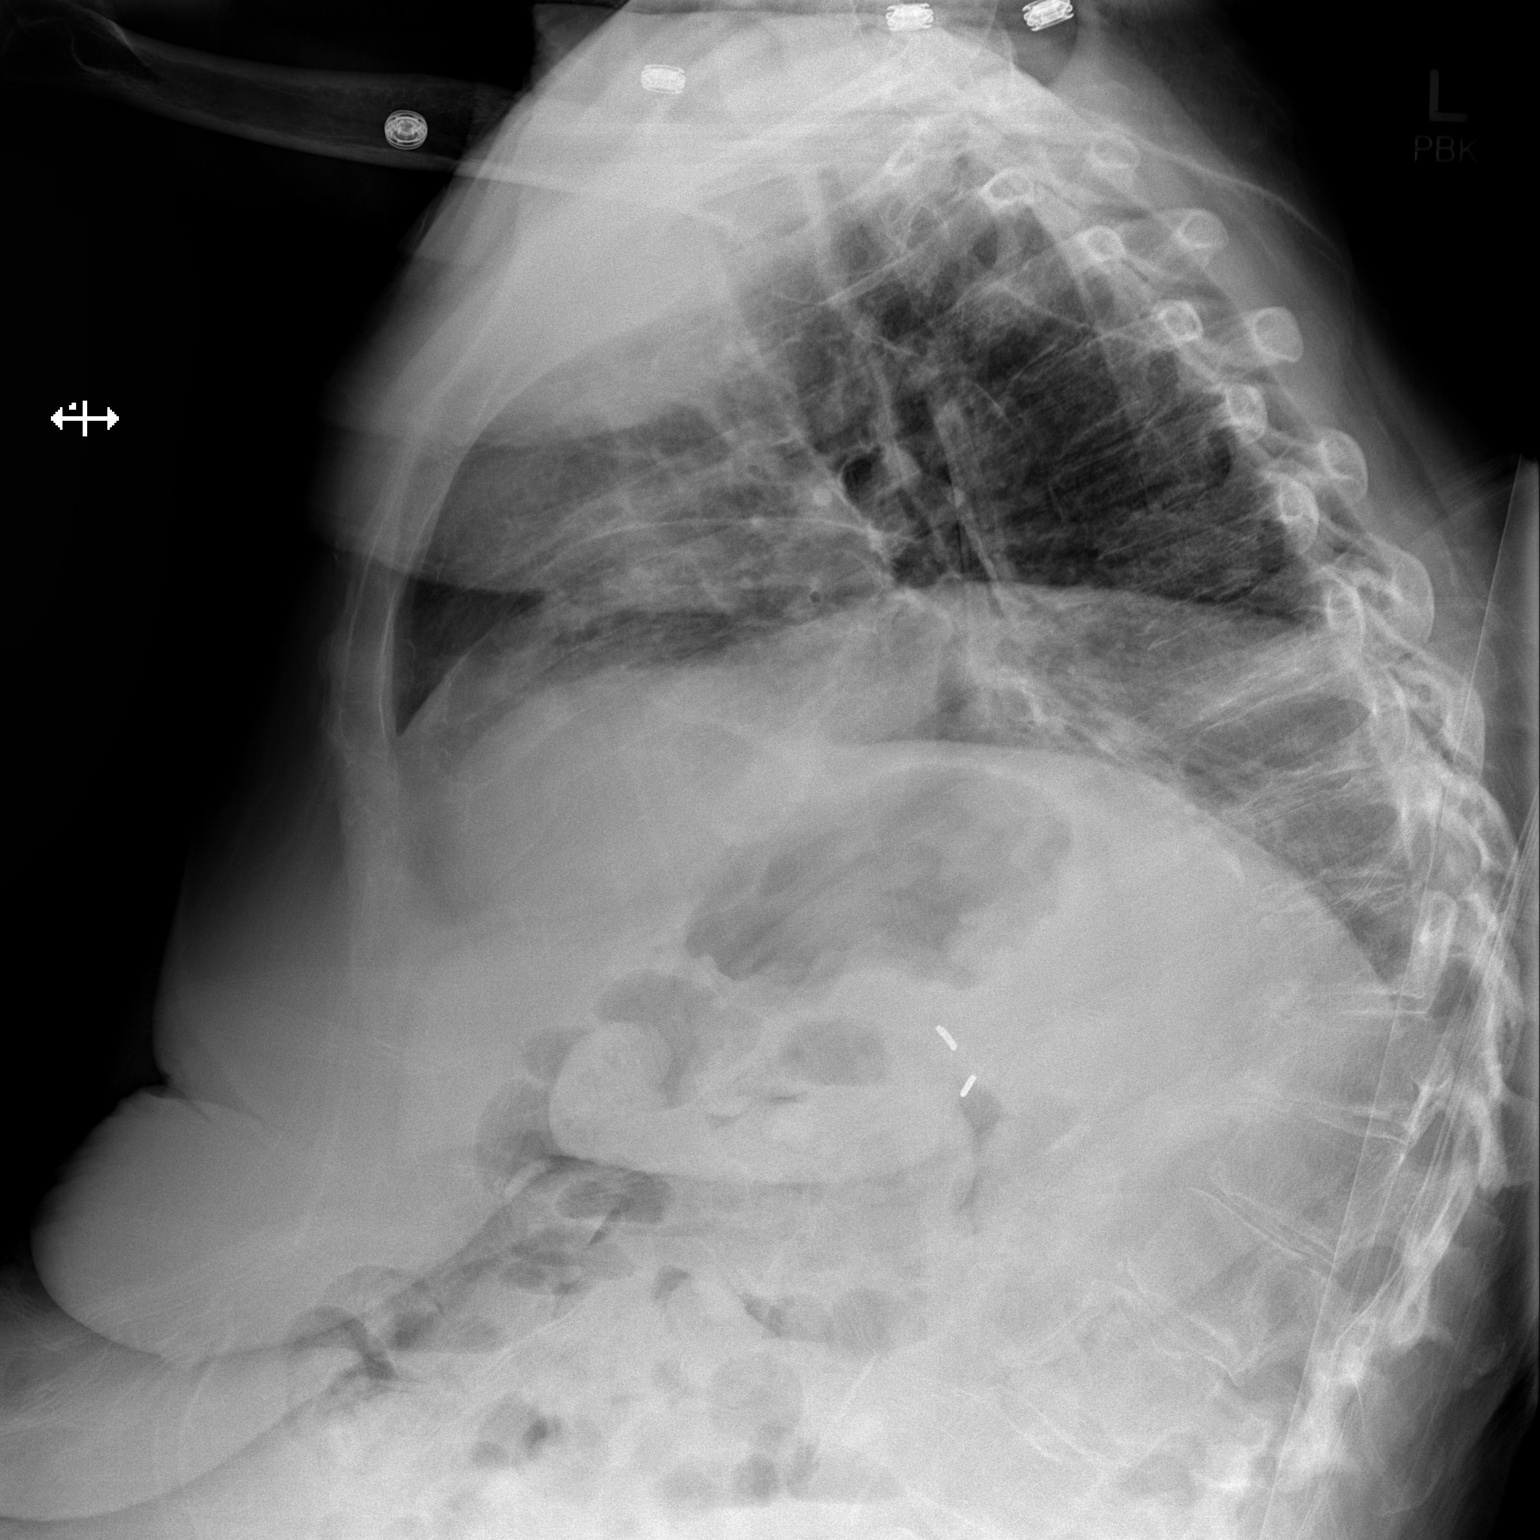

[x chest ap]
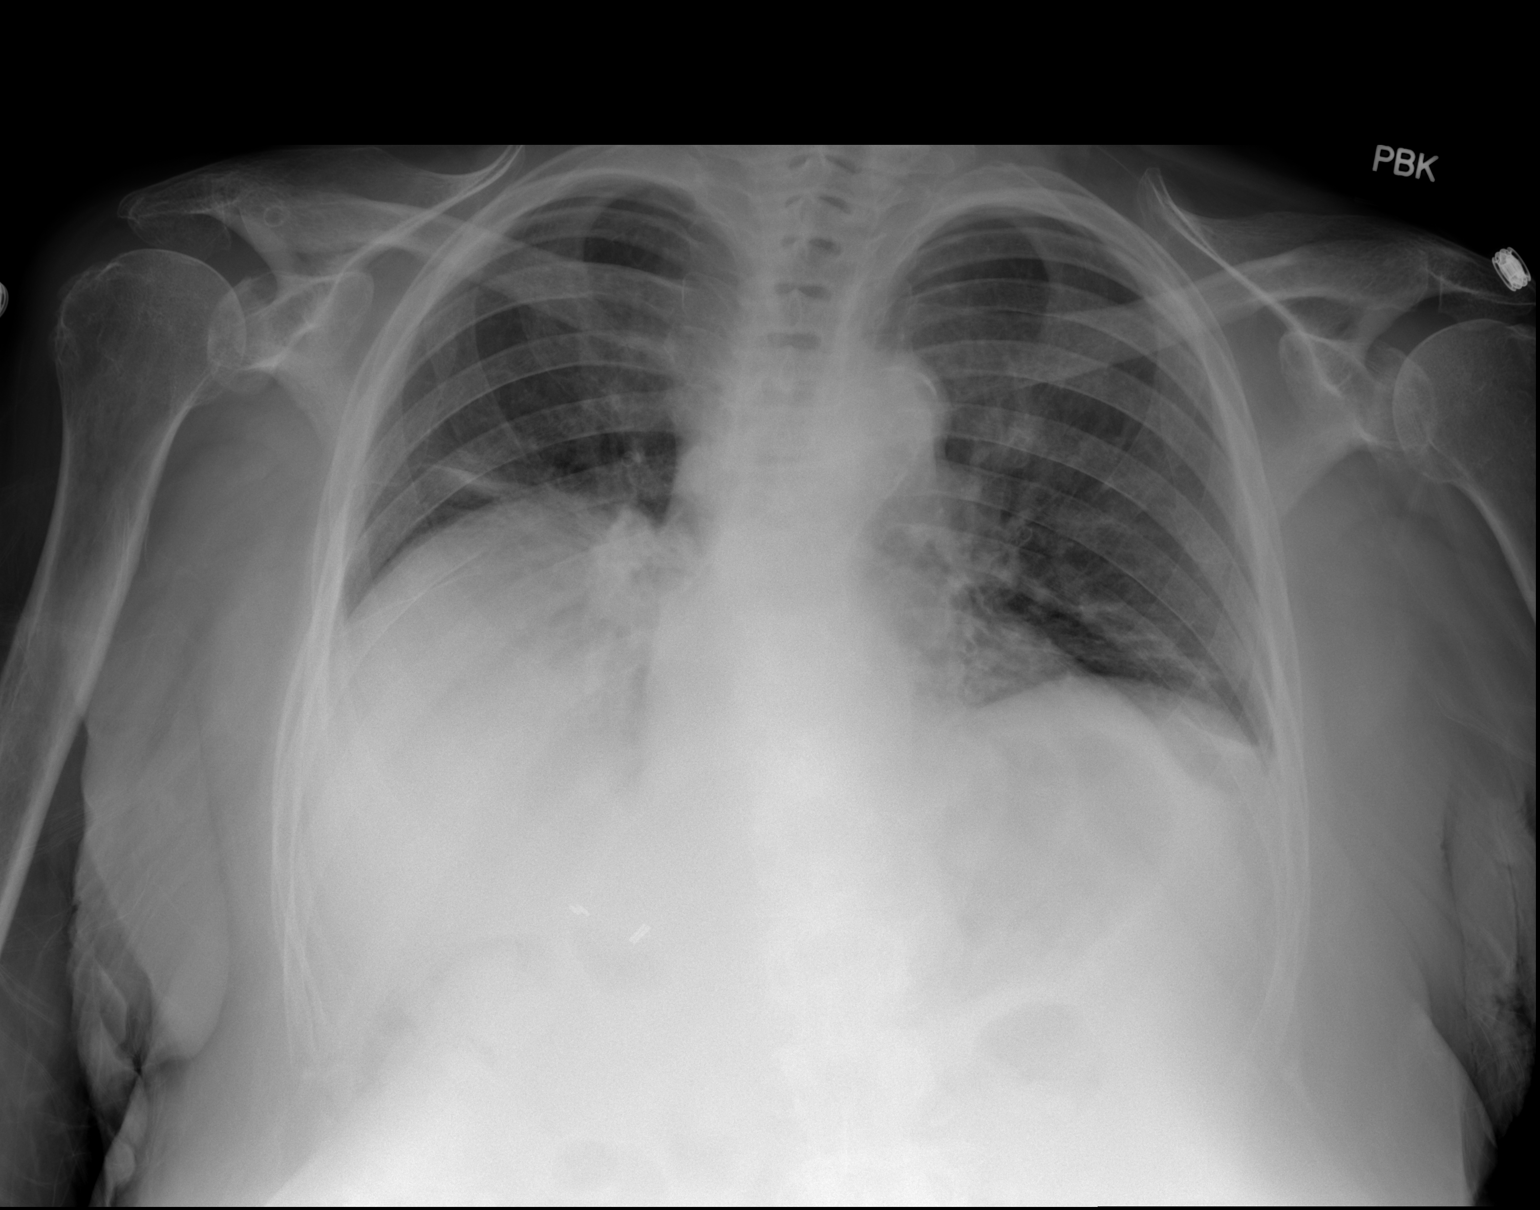

[2 of 2 positions shown; findings below may reference images not displayed]

FINDINGS: The heart size and mediastinal contours are within normal limits.
Low lung volumes are again demonstrated. Elevation of right
hemidiaphragm is stable. Increased linear opacity at both lung bases
consistent with mild subsegmental atelectasis. No evidence of
pulmonary consolidation or edema. No evidence of pneumothorax or
pleural effusion.
IMPRESSION: Low lung volumes with mild bibasilar subsegmental atelectasis.

## 2019-05-30 ENCOUNTER — Other Ambulatory Visit: Payer: Self-pay | Admitting: Internal Medicine

## 2019-11-05 ENCOUNTER — Emergency Department (HOSPITAL_COMMUNITY): Payer: Medicare HMO

## 2019-11-05 ENCOUNTER — Inpatient Hospital Stay (HOSPITAL_COMMUNITY)
Admission: EM | Admit: 2019-11-05 | Discharge: 2019-11-09 | DRG: 291 | Disposition: A | Discharge status: Home / Self Care | Payer: Medicare HMO | Attending: Internal Medicine | Admitting: Internal Medicine

## 2019-11-05 ENCOUNTER — Encounter (HOSPITAL_COMMUNITY): Payer: Self-pay

## 2019-11-05 ENCOUNTER — Other Ambulatory Visit: Payer: Self-pay

## 2019-11-05 DIAGNOSIS — I2510 Atherosclerotic heart disease of native coronary artery without angina pectoris: Secondary | ICD-10-CM | POA: Diagnosis not present

## 2019-11-05 DIAGNOSIS — E7800 Pure hypercholesterolemia, unspecified: Secondary | ICD-10-CM | POA: Diagnosis not present

## 2019-11-05 DIAGNOSIS — D509 Iron deficiency anemia, unspecified: Secondary | ICD-10-CM | POA: Diagnosis present

## 2019-11-05 DIAGNOSIS — N179 Acute kidney failure, unspecified: Secondary | ICD-10-CM | POA: Diagnosis present

## 2019-11-05 DIAGNOSIS — I509 Heart failure, unspecified: Secondary | ICD-10-CM | POA: Diagnosis not present

## 2019-11-05 DIAGNOSIS — Z7984 Long term (current) use of oral hypoglycemic drugs: Secondary | ICD-10-CM | POA: Diagnosis not present

## 2019-11-05 DIAGNOSIS — I248 Other forms of acute ischemic heart disease: Secondary | ICD-10-CM | POA: Diagnosis present

## 2019-11-05 DIAGNOSIS — I110 Hypertensive heart disease with heart failure: Principal | ICD-10-CM | POA: Diagnosis present

## 2019-11-05 DIAGNOSIS — I2489 Other forms of acute ischemic heart disease: Secondary | ICD-10-CM

## 2019-11-05 DIAGNOSIS — Z888 Allergy status to other drugs, medicaments and biological substances status: Secondary | ICD-10-CM

## 2019-11-05 DIAGNOSIS — F329 Major depressive disorder, single episode, unspecified: Secondary | ICD-10-CM | POA: Diagnosis present

## 2019-11-05 DIAGNOSIS — Z20822 Contact with and (suspected) exposure to covid-19: Secondary | ICD-10-CM | POA: Diagnosis present

## 2019-11-05 DIAGNOSIS — E1142 Type 2 diabetes mellitus with diabetic polyneuropathy: Secondary | ICD-10-CM | POA: Diagnosis not present

## 2019-11-05 DIAGNOSIS — E669 Obesity, unspecified: Secondary | ICD-10-CM | POA: Diagnosis present

## 2019-11-05 DIAGNOSIS — D649 Anemia, unspecified: Secondary | ICD-10-CM | POA: Diagnosis not present

## 2019-11-05 DIAGNOSIS — N289 Disorder of kidney and ureter, unspecified: Secondary | ICD-10-CM

## 2019-11-05 DIAGNOSIS — J9601 Acute respiratory failure with hypoxia: Secondary | ICD-10-CM | POA: Diagnosis present

## 2019-11-05 DIAGNOSIS — I5043 Acute on chronic combined systolic (congestive) and diastolic (congestive) heart failure: Secondary | ICD-10-CM | POA: Diagnosis present

## 2019-11-05 DIAGNOSIS — I5021 Acute systolic (congestive) heart failure: Secondary | ICD-10-CM

## 2019-11-05 DIAGNOSIS — K219 Gastro-esophageal reflux disease without esophagitis: Secondary | ICD-10-CM | POA: Diagnosis present

## 2019-11-05 DIAGNOSIS — Z8719 Personal history of other diseases of the digestive system: Secondary | ICD-10-CM | POA: Diagnosis not present

## 2019-11-05 DIAGNOSIS — E871 Hypo-osmolality and hyponatremia: Secondary | ICD-10-CM | POA: Diagnosis present

## 2019-11-05 DIAGNOSIS — R0602 Shortness of breath: Secondary | ICD-10-CM

## 2019-11-05 DIAGNOSIS — Z8249 Family history of ischemic heart disease and other diseases of the circulatory system: Secondary | ICD-10-CM | POA: Diagnosis not present

## 2019-11-05 DIAGNOSIS — E872 Acidosis: Secondary | ICD-10-CM | POA: Diagnosis present

## 2019-11-05 DIAGNOSIS — M1990 Unspecified osteoarthritis, unspecified site: Secondary | ICD-10-CM | POA: Diagnosis present

## 2019-11-05 DIAGNOSIS — E785 Hyperlipidemia, unspecified: Secondary | ICD-10-CM | POA: Diagnosis present

## 2019-11-05 DIAGNOSIS — J189 Pneumonia, unspecified organism: Secondary | ICD-10-CM | POA: Diagnosis present

## 2019-11-05 DIAGNOSIS — Z6835 Body mass index (BMI) 35.0-35.9, adult: Secondary | ICD-10-CM | POA: Diagnosis not present

## 2019-11-05 DIAGNOSIS — Z79899 Other long term (current) drug therapy: Secondary | ICD-10-CM | POA: Diagnosis not present

## 2019-11-05 DIAGNOSIS — I159 Secondary hypertension, unspecified: Secondary | ICD-10-CM

## 2019-11-05 DIAGNOSIS — Z8701 Personal history of pneumonia (recurrent): Secondary | ICD-10-CM

## 2019-11-05 DIAGNOSIS — E1149 Type 2 diabetes mellitus with other diabetic neurological complication: Secondary | ICD-10-CM

## 2019-11-05 DIAGNOSIS — R000 Tachycardia, unspecified: Secondary | ICD-10-CM | POA: Diagnosis not present

## 2019-11-05 DIAGNOSIS — I5033 Acute on chronic diastolic (congestive) heart failure: Secondary | ICD-10-CM | POA: Diagnosis not present

## 2019-11-05 DIAGNOSIS — I10 Essential (primary) hypertension: Secondary | ICD-10-CM | POA: Diagnosis present

## 2019-11-05 LAB — CBC WITH DIFFERENTIAL/PLATELET
Abs Immature Granulocytes: 0.02 10*3/uL (ref 0.00–0.07)
Basophils Absolute: 0 10*3/uL (ref 0.0–0.1)
Basophils Relative: 0 %
Eosinophils Absolute: 0 10*3/uL (ref 0.0–0.5)
Eosinophils Relative: 0 %
HCT: 25.8 % — ABNORMAL LOW (ref 36.0–46.0)
Hemoglobin: 8.1 g/dL — ABNORMAL LOW (ref 12.0–15.0)
Immature Granulocytes: 0 %
Lymphocytes Relative: 11 %
Lymphs Abs: 1.1 10*3/uL (ref 0.7–4.0)
MCH: 27.2 pg (ref 26.0–34.0)
MCHC: 31.4 g/dL (ref 30.0–36.0)
MCV: 86.6 fL (ref 80.0–100.0)
Monocytes Absolute: 0.4 10*3/uL (ref 0.1–1.0)
Monocytes Relative: 4 %
Neutro Abs: 9.2 10*3/uL — ABNORMAL HIGH (ref 1.7–7.7)
Neutrophils Relative %: 85 %
Platelets: 296 10*3/uL (ref 150–400)
RBC: 2.98 MIL/uL — ABNORMAL LOW (ref 3.87–5.11)
RDW: 14 % (ref 11.5–15.5)
WBC: 10.8 10*3/uL — ABNORMAL HIGH (ref 4.0–10.5)
nRBC: 0 % (ref 0.0–0.2)

## 2019-11-05 LAB — APTT: aPTT: 31 seconds (ref 24–36)

## 2019-11-05 LAB — COMPREHENSIVE METABOLIC PANEL
ALT: 9 U/L (ref 0–44)
AST: 17 U/L (ref 15–41)
Albumin: 3.3 g/dL — ABNORMAL LOW (ref 3.5–5.0)
Alkaline Phosphatase: 73 U/L (ref 38–126)
Anion gap: 11 (ref 5–15)
BUN: 13 mg/dL (ref 8–23)
CO2: 21 mmol/L — ABNORMAL LOW (ref 22–32)
Calcium: 8.7 mg/dL — ABNORMAL LOW (ref 8.9–10.3)
Chloride: 100 mmol/L (ref 98–111)
Creatinine, Ser: 1.08 mg/dL — ABNORMAL HIGH (ref 0.44–1.00)
GFR calc Af Amer: 59 mL/min — ABNORMAL LOW (ref >60)
GFR calc non Af Amer: 51 mL/min — ABNORMAL LOW (ref >60)
Glucose, Bld: 149 mg/dL — ABNORMAL HIGH (ref 70–99)
Potassium: 4.4 mmol/L (ref 3.5–5.1)
Sodium: 132 mmol/L — ABNORMAL LOW (ref 135–145)
Total Bilirubin: 0.5 mg/dL (ref 0.3–1.2)
Total Protein: 6.6 g/dL (ref 6.5–8.1)

## 2019-11-05 LAB — LACTIC ACID, PLASMA
Lactic Acid, Venous: 1.3 mmol/L (ref 0.5–1.9)
Lactic Acid, Venous: 1.1 mmol/L (ref 0.5–1.9)

## 2019-11-05 LAB — PROTIME-INR
INR: 1.1 (ref 0.8–1.2)
Prothrombin Time: 13.6 seconds (ref 11.4–15.2)

## 2019-11-05 LAB — BRAIN NATRIURETIC PEPTIDE: B Natriuretic Peptide: 719.2 pg/mL — ABNORMAL HIGH (ref 0.0–100.0)

## 2019-11-05 LAB — SARS CORONAVIRUS 2 BY RT PCR (HOSPITAL ORDER, PERFORMED IN ~~LOC~~ HOSPITAL LAB): SARS Coronavirus 2: NEGATIVE

## 2019-11-05 LAB — TROPONIN I (HIGH SENSITIVITY)
Troponin I (High Sensitivity): 54 ng/L — ABNORMAL HIGH (ref <18)
Troponin I (High Sensitivity): 345 ng/L (ref <18)

## 2019-11-05 LAB — ABO/RH: ABO/RH(D): O POS

## 2019-11-05 LAB — POC OCCULT BLOOD, ED: Fecal Occult Bld: NEGATIVE

## 2019-11-05 MED ORDER — IOHEXOL 350 MG/ML SOLN
100.0000 mL | Freq: Once | INTRAVENOUS | Status: AC | PRN
  Administered 2019-11-05: 70 mL via INTRAVENOUS

## 2019-11-05 MED ORDER — SODIUM CHLORIDE (PF) 0.9 % IJ SOLN
INTRAMUSCULAR | Status: AC
  Filled 2019-11-05: qty 50

## 2019-11-05 MED ORDER — SODIUM CHLORIDE 0.9 % IV SOLN
500.0000 mg | Freq: Once | INTRAVENOUS | Status: AC
  Administered 2019-11-05: 500 mg via INTRAVENOUS
  Filled 2019-11-05: qty 500

## 2019-11-05 MED ORDER — SODIUM CHLORIDE 0.9 % IV SOLN
1.0000 g | Freq: Once | INTRAVENOUS | Status: AC
  Administered 2019-11-05: 1 g via INTRAVENOUS
  Filled 2019-11-05: qty 10

## 2019-11-05 MED ORDER — FUROSEMIDE 10 MG/ML IJ SOLN
20.0000 mg | Freq: Once | INTRAMUSCULAR | Status: AC
  Administered 2019-11-05: 20 mg via INTRAVENOUS
  Filled 2019-11-05: qty 4

## 2019-11-05 MED ORDER — ACETAMINOPHEN 325 MG PO TABS
650.0000 mg | ORAL_TABLET | Freq: Once | ORAL | Status: AC
  Administered 2019-11-05: 650 mg via ORAL
  Filled 2019-11-05: qty 2

## 2019-11-05 NOTE — ED Notes (Signed)
Date and time results received: 11/05/19 10:49 PM  (use smartphrase ".now" to insert current time)  Test: Troponin Critical Value: 345  Name of Provider Notified: Dr.Opyd  Orders Received? Or Actions Taken?:repeat troponin in several hours along with EKG

## 2019-11-05 NOTE — ED Provider Notes (Signed)
[facility] DEPT Provider Note   CSN: [id number] Arrival date & time: 11/05/19  1803     History No chief complaint on file.   Chloe Harding is a 74 y.o. female.  The history is provided by the patient, medical records and the EMS personnel. No language interpreter was used.   Chloe Harding is a 74 y.o. female who presents to the Emergency Department complaining of shortness of breath. She presents the emergency department by EMS from home for evaluation of shortness of breath. She began feeling poorly yesterday with increased shortness of breath. Symptoms worsened today and she developed associated chest discomfort. Her daughter called 911. Chest discomfort is described as a pressure like sensation on her chest that waxes and wanes. Per EMS she was diagnosed with pneumonia two weeks ago. She was also told that her hemoglobin had dropped from 11 to 8. She has been vaccinated against COVID-19. She has no sick contacts. She does not take any blood thinners. She denies any black or bloody stools. Denies any fevers, abdominal pain, vomiting, diarrhea. Symptoms are severe and constant nature.    Past Medical History:  Diagnosis Date   Arthritis    Depression    Diabetes mellitus    Hiatal hernia    Hypercholesteremia    Hypertension    Reflux    Renal disorder    shutting down 4 years ago    Patient Active Problem List   Diagnosis Date Noted   Pneumonia 11/05/2019   Hyponatremia 11/05/2019   Acute CHF (congestive heart failure) ([employer]) 11/05/2019   Esophageal stricture    Dysphagia 01/15/2019   Symptomatic anemia 05/18/2017   Acute-on-chronic kidney injury ([place]) 05/18/2017   Hypertension 05/18/2017   Hiatal hernia 05/18/2017   History of esophageal stricture 05/18/2017   Hypercholesteremia 05/18/2017   Diabetes mellitus 05/18/2017   Solitary thyroid nodule 05/18/2017   Hypotension 03/30/2015   Sepsis ([place]) 03/30/2015    CAP (community acquired pneumonia) 03/30/2015   Hyperkalemia 03/30/2015   Mild renal insufficiency 03/30/2015   Nausea with vomiting 03/30/2015   Diabetic neuropathy ([place]) 03/30/2015   OBESITY 03/11/2010   Type 2 diabetes mellitus ([place]) 12/26/2009   Dyslipidemia 12/26/2009   ANEMIA 12/26/2009   DEPRESSION 12/26/2009   HYPERTENSION, BENIGN ESSENTIAL 12/26/2009   GERD 12/26/2009   HIATAL HERNIA 12/26/2009   CHRONIC KIDNEY DISEASE STAGE I 12/26/2009   UTI 12/26/2009   ARTHRITIS 12/26/2009   ARTHROSCOPY, RIGHT KNEE, HX OF 12/26/2009    Past Surgical History:  Procedure Laterality Date   CHOLECYSTECTOMY     COLONOSCOPY WITH PROPOFOL N/A 05/20/2017   Procedure: COLONOSCOPY WITH PROPOFOL;  Surgeon: Carol Ada, MD;  Location: [facility];  Service: Endoscopy;  Laterality: N/A;   ESOPHAGOGASTRODUODENOSCOPY N/A 05/19/2017   Procedure: ESOPHAGOGASTRODUODENOSCOPY (EGD);  Surgeon: Juanita Craver, MD;  Location: [facility] ENDOSCOPY;  Service: Endoscopy;  Laterality: N/A;   ESOPHAGOGASTRODUODENOSCOPY (EGD) WITH PROPOFOL N/A 01/17/2019   Procedure: ESOPHAGOGASTRODUODENOSCOPY (EGD) WITH PROPOFOL;  Surgeon: Carol Ada, MD;  Location: [facility] ENDOSCOPY;  Service: Endoscopy;  Laterality: N/A;   SAVORY DILATION N/A 01/17/2019   Procedure: SAVORY DILATION;  Surgeon: Carol Ada, MD;  Location: [facility] ENDOSCOPY;  Service: Endoscopy;  Laterality: N/A;     OB History   No obstetric history on file.     History reviewed. No pertinent family history.  Social History   Tobacco Use   Smoking status: Never Smoker   Smokeless tobacco: Never Used  Vaping Use   Vaping Use:  Never used  Substance Use Topics   Alcohol use: No   Drug use: No    Home Medications Prior to Admission medications   Medication Sig Start Date End Date Taking? Authorizing Provider  amitriptyline (ELAVIL) 25 MG tablet Take 25 mg by mouth at bedtime.   Yes [provider]  benzonatate (TESSALON) 100 MG  capsule Take 100 mg by mouth 4 (four) times daily as needed for cough.  10/25/19  Yes [provider]  CONSTULOSE 10 GM/15ML solution Take 10 g by mouth daily as needed for mild constipation.  10/25/19  Yes [provider]  escitalopram (LEXAPRO) 5 MG tablet Take 5 mg by mouth daily. 10/03/19  Yes [provider]  folic acid (FOLVITE) 1 MG tablet Take 1 mg by mouth daily.   Yes [provider]  furosemide (LASIX) 20 MG tablet Take 20 mg by mouth daily as needed for edema.  10/25/19  Yes [provider]  gabapentin (NEURONTIN) 400 MG capsule Take 400 mg by mouth 3 (three) times daily. 10/03/19  Yes [provider]  hydroxychloroquine (PLAQUENIL) 200 MG tablet Take 200 mg by mouth daily. 10/04/19  Yes [provider]  JANUVIA 100 MG tablet Take 100 mg by mouth daily. 10/02/19  Yes [provider]  lisinopril (ZESTRIL) 2.5 MG tablet Take 2.5 mg by mouth daily. 10/15/19  Yes [provider]  pantoprazole (PROTONIX) 40 MG tablet Take 1 tablet (40 mg total) by mouth 2 (two) times daily. 01/18/19  Yes Mariel Aloe, MD  potassium chloride (KLOR-CON) 10 MEQ tablet Take 10 mEq by mouth daily. 10/25/19  Yes [provider]  ropinirole (REQUIP) 5 MG tablet Take 5 mg by mouth at bedtime. 10/02/19  Yes [provider]    Allergies    Amlodipine besylate  Review of Systems   Review of Systems  All other systems reviewed and are negative.   Physical Exam Updated Vital Signs BP 135/65 (BP Location: Left Arm)    Pulse (!) 106    Temp 99 F (37.2 C) (Oral)    Resp 20    Ht 5\' 1"  (1.549 m)    Wt 84.2 kg    SpO2 96%    BMI 35.07 kg/m   Physical Exam Vitals and nursing note reviewed.  Constitutional:      Appearance: She is well-developed.  HENT:     Head: Normocephalic and atraumatic.  Cardiovascular:     Rate and Rhythm: Normal rate and regular rhythm.     Heart sounds: No murmur heard.   Pulmonary:     Effort:  No respiratory distress.     Comments: Tachypnea. Occasional wheezes in the lung bases Abdominal:     Palpations: Abdomen is soft.     Tenderness: There is no abdominal tenderness. There is no guarding or rebound.  Musculoskeletal:        General: Swelling present. No tenderness.     Comments: Non pitting edema to bilateral lower extremities, left greater than right.  Skin:    General: Skin is warm and dry.     Coloration: Skin is pale.  Neurological:     Mental Status: She is alert and oriented to person, place, and time.  Psychiatric:        Behavior: Behavior normal.     ED Results / Procedures / Treatments   Labs (all labs ordered are listed, but only abnormal results are displayed) Labs Reviewed  COMPREHENSIVE METABOLIC PANEL - Abnormal; Notable  for the following components:      Result Value   Sodium 132 (*)    CO2 21 (*)    Glucose, Bld 149 (*)    Creatinine, Ser 1.08 (*)    Calcium 8.7 (*)    Albumin 3.3 (*)    GFR calc non Af Amer 51 (*)    GFR calc Af Amer 59 (*)    All other components within normal limits  CBC WITH DIFFERENTIAL/PLATELET - Abnormal; Notable for the following components:   WBC 10.8 (*)    RBC 2.98 (*)    Hemoglobin 8.1 (*)    HCT 25.8 (*)    Neutro Abs 9.2 (*)    All other components within normal limits  BRAIN NATRIURETIC PEPTIDE - Abnormal; Notable for the following components:   B Natriuretic Peptide 719.2 (*)    All other components within normal limits  TROPONIN I (HIGH SENSITIVITY) - Abnormal; Notable for the following components:   Troponin I (High Sensitivity) 54 (*)    All other components within normal limits  TROPONIN I (HIGH SENSITIVITY) - Abnormal; Notable for the following components:   Troponin I (High Sensitivity) 345 (*)    All other components within normal limits  SARS CORONAVIRUS 2 BY RT PCR (HOSPITAL ORDER, [facility])  CULTURE, BLOOD (ROUTINE X 2)  CULTURE, BLOOD (ROUTINE X 2)  URINE  CULTURE  LACTIC ACID, PLASMA  LACTIC ACID, PLASMA  APTT  PROTIME-INR  URINALYSIS, ROUTINE W REFLEX MICROSCOPIC  VITAMIN B12  FOLATE  IRON AND TIBC  FERRITIN  RETICULOCYTES  POC OCCULT BLOOD, ED  TYPE AND SCREEN  ABO/RH    EKG EKG Interpretation  Date/Time:  Sunday November 05 2019 18:20:16 EDT Ventricular Rate:  123 PR Interval:    QRS Duration: 95 QT Interval:  334 QTC Calculation: 478 R Axis:   73 Text Interpretation: Sinus tachycardia Low voltage, precordial leads Borderline repolarization abnormality Confirmed by Quintella Reichert [phone]) on 11/05/2019 6:28:23 PM   Radiology CT Angio Chest PE W/Cm &/Or Wo Cm  Result Date: 11/05/2019 CLINICAL DATA:  Chest pain and shortness of breath. Recent diagnosis of pneumonia. EXAM: CT ANGIOGRAPHY CHEST WITH CONTRAST TECHNIQUE: Multidetector CT imaging of the chest was performed using the standard protocol during bolus administration of intravenous contrast. Multiplanar CT image reconstructions and MIPs were obtained to evaluate the vascular anatomy. CONTRAST:  51mL OMNIPAQUE IOHEXOL 350 MG/ML SOLN COMPARISON:  Radiograph earlier this day.  Chest CT 01/15/2019 FINDINGS: Cardiovascular: There are no filling defects within the pulmonary arteries to suggest pulmonary embolus. Moderate aortic atherosclerosis. Cannot assess for dissection given phase of contrast tailored to pulmonary artery evaluation. Mitral annulus calcifications. Coronary artery calcifications. Heart is upper normal in size. Mediastinum/Nodes: Prominent subcarinal node measures 14 mm, series 4, image 54. There are shotty bilateral hilar nodes measuring 10 mm on the left and 8 mm on the right. Hypodense 2.3 cm right thyroid nodule. Patulous esophagus with small to moderate hiatal hernia. Lungs/Pleura: Elevated right hemidiaphragm with compressive atelectasis in the right lower and middle lobes. Small bilateral pleural effusions. Patchy ground-glass opacities in the superior segment of  the right lower lobe and throughout the right upper lobe suspicious for pneumonia. There is central bronchial thickening with areas of mucous plugging on the left. Questionable mild pulmonary edema with smooth septal thickening. Upper Abdomen: Small to moderate hiatal hernia. Cholecystectomy. No acute or unexpected findings. Musculoskeletal: Diffuse degenerative change in the spine. There are no acute or suspicious  osseous abnormalities. Review of the MIP images confirms the above findings. IMPRESSION: 1. No pulmonary embolus. 2. Patchy ground-glass opacities in the superior segment of the right lower lobe and throughout the right upper lobe suspicious for pneumonia. 3. Elevated right hemidiaphragm with compressive atelectasis in the right middle and lower lobes. 4. Small bilateral pleural effusions. Questionable mild pulmonary edema with smooth septal thickening. 5. Central bronchial thickening with areas of mucous plugging in the left lower lobe. 6. Small to moderate hiatal hernia. 7. Right thyroid nodule measuring 2.3 cm. This was evaluated on thyroid imaging 01/15/2019, please reference that diagnostic ultrasound for further recommendations. Aortic Atherosclerosis (ICD10-I70.0). Electronically Signed   By: Keith Rake M.D.   On: 11/05/2019 20:41   DG Chest Port 1 View  Result Date: 11/05/2019 CLINICAL DATA:  Fever, shortness of breath EXAM: PORTABLE CHEST 1 VIEW COMPARISON:  10/02/2017 FINDINGS: Low lung volumes. Chronic elevation of the right hemidiaphragm with bibasilar atelectasis. Possible small left pleural effusion. No pneumothorax. Atherosclerotic calcification of the aortic knob. IMPRESSION: Low lung volumes with bibasilar atelectasis. Possible small left pleural effusion. Electronically Signed   By: Davina Poke D.O.   On: 11/05/2019 18:45    Procedures Procedures (including critical care time) CRITICAL CARE Performed by: Quintella Reichert   Total critical care time: 35  minutes  Critical care time was exclusive of separately billable procedures and treating other patients.  Critical care was necessary to treat or prevent imminent or life-threatening deterioration.  Critical care was time spent personally by me on the following activities: development of treatment plan with patient and/or surrogate as well as nursing, discussions with consultants, evaluation of patient's response to treatment, examination of patient, obtaining history from patient or surrogate, ordering and performing treatments and interventions, ordering and review of laboratory studies, ordering and review of radiographic studies, pulse oximetry and re-evaluation of patient's condition.  Medications Ordered in ED Medications  sodium chloride (PF) 0.9 % injection (has no administration in time range)  acetaminophen (TYLENOL) tablet 650 mg (650 mg Oral Given 11/05/19 1828)  cefTRIAXone (ROCEPHIN) 1 g in sodium chloride 0.9 % 100 mL IVPB (0 g Intravenous Stopped 11/05/19 1915)  azithromycin (ZITHROMAX) 500 mg in sodium chloride 0.9 % 250 mL IVPB ( Intravenous Stopped 11/05/19 2051)  iohexol (OMNIPAQUE) 350 MG/ML injection 100 mL (70 mLs Intravenous Contrast Given 11/05/19 1956)  furosemide (LASIX) injection 20 mg (20 mg Intravenous Given 11/05/19 2111)    ED Course  I have reviewed the triage vital signs and the nursing notes.  Pertinent labs & imaging results that were available during my care of the patient were reviewed by me and considered in my medical decision making (see chart for details).    MDM Rules/Calculators/A&P                         Patient presents the emergency department for increase shortness of breath and chest pain. She is tachycardic, tachypnea on ED arrival. She was treated with antibiotics for possible pneumonia on presentation. Fluid hydration was withheld because she appeared slightly volume overloaded. CTA was obtained, which was negative for PE but does demonstrate  right-sided pneumonia as well as findings concerning for congestive heart failure. She was treated with diuresis as well. On repeat assessment patient does appear to be feeling partially improved. Discussed with patient findings of studies and recommendation for admission and she is in agreement with treatment plan. Hospitalist consulted for admission.  Final Clinical Impression(s) /  ED Diagnoses Final diagnoses:  Community acquired pneumonia of right upper lobe of lung  Acute congestive heart failure, unspecified heart failure type [facility])    Rx / DC Orders ED Discharge Orders    None       Quintella Reichert, MD 11/05/19 2340

## 2019-11-05 NOTE — H&P (Addendum)
History and Physical    Chloe Harding WUX:[id number] DOB: [birth year] DOA: 11/05/2019  PCP: [name], NP   Patient coming from: Home   Chief Complaint: SOB, cough, leg swelling   HPI: Chloe Harding is a 74 y.o. female with medical history significant for hypertension, type 2 diabetes mellitus, and arthritis, now presenting to the emergency department with shortness of breath and cough.  Patient reports that she developed shortness of breath and cough, as well as bilateral lower extremity swelling approximately 2 weeks ago, was noted to have more swelling on the left than right and reports undergoing venous Dopplers of the lower extremities that were negative for DVT.  She was also started on Lasix 20 mg daily in the past couple weeks as well as amoxicillin for suspected pneumonia.  Despite this, patient reports continued worsening in her dyspnea and cough.  She denies any chest pain, melena, hematochezia, or abdominal pain.  ED Course: Upon arrival to the ED, patient is found to be febrile to 38.3 C, saturating upper 80s on room air, tachypneic, tachycardic to 130, and with blood pressure 107/58.  EKG features sinus tachycardia with rate 123.  CTA chest is negative for PE but concerning for pneumonia in the right upper and right lower lobes, small bilateral pleural effusions, and suspected mild pulmonary edema.  Chemistry panel notable for sodium 132 and creatinine 1.08, from 0.63 a year ago.  CBC notable for mild leukocytosis and normocytic anemia with hemoglobin 8.1, down from 10.9 last August.  BNP is elevated 719, troponin is 54, and lactic acid is reassuringly normal.  Fecal occult blood testing was negative and Covid PCR is negative.  Blood cultures were collected and the patient was treated with Rocephin, azithromycin, and Lasix in the ED.  Review of Systems:  All other systems reviewed and apart from HPI, are negative.  Past Medical History:  Diagnosis Date  . Arthritis   .  Depression   . Diabetes mellitus   . Hiatal hernia   . Hypercholesteremia   . Hypertension   . Reflux   . Renal disorder    shutting down 4 years ago    Past Surgical History:  Procedure Laterality Date  . CHOLECYSTECTOMY    . COLONOSCOPY WITH PROPOFOL N/A 05/20/2017   Procedure: COLONOSCOPY WITH PROPOFOL;  Surgeon: Carol Ada, MD;  Location: [facility];  Service: Endoscopy;  Laterality: N/A;  . ESOPHAGOGASTRODUODENOSCOPY N/A 05/19/2017   Procedure: ESOPHAGOGASTRODUODENOSCOPY (EGD);  Surgeon: Juanita Craver, MD;  Location: [facility] ENDOSCOPY;  Service: Endoscopy;  Laterality: N/A;  . ESOPHAGOGASTRODUODENOSCOPY (EGD) WITH PROPOFOL N/A 01/17/2019   Procedure: ESOPHAGOGASTRODUODENOSCOPY (EGD) WITH PROPOFOL;  Surgeon: Carol Ada, MD;  Location: [facility] ENDOSCOPY;  Service: Endoscopy;  Laterality: N/A;  . SAVORY DILATION N/A 01/17/2019   Procedure: SAVORY DILATION;  Surgeon: Carol Ada, MD;  Location: [facility] ENDOSCOPY;  Service: Endoscopy;  Laterality: N/A;     reports that she has never smoked. She has never used smokeless tobacco. She reports that she does not drink alcohol and does not use drugs.  Allergies  Allergen Reactions  . Amlodipine Besylate Swelling    swelling    History reviewed. No pertinent family history.   Prior to Admission medications   Medication Sig Start Date End Date Taking? Authorizing Provider  amitriptyline (ELAVIL) 25 MG tablet Take 25 mg by mouth at bedtime.   Yes [provider]  benzonatate (TESSALON) 100 MG capsule Take 100 mg by mouth 4 (four) times daily as needed for cough.  10/25/19  Yes [provider]  CONSTULOSE 10 GM/15ML solution Take 10 g by mouth daily as needed for mild constipation.  10/25/19  Yes [provider]  escitalopram (LEXAPRO) 5 MG tablet Take 5 mg by mouth daily. 10/03/19  Yes [provider]  folic acid (FOLVITE) 1 MG tablet Take 1 mg by mouth daily.   Yes [provider]  furosemide (LASIX) 20 MG  tablet Take 20 mg by mouth daily as needed for edema.  10/25/19  Yes [provider]  gabapentin (NEURONTIN) 400 MG capsule Take 400 mg by mouth 3 (three) times daily. 10/03/19  Yes [provider]  hydroxychloroquine (PLAQUENIL) 200 MG tablet Take 200 mg by mouth daily. 10/04/19  Yes [provider]  JANUVIA 100 MG tablet Take 100 mg by mouth daily. 10/02/19  Yes [provider]  lisinopril (ZESTRIL) 2.5 MG tablet Take 2.5 mg by mouth daily. 10/15/19  Yes [provider]  pantoprazole (PROTONIX) 40 MG tablet Take 1 tablet (40 mg total) by mouth 2 (two) times daily. 01/18/19  Yes Mariel Aloe, MD  potassium chloride (KLOR-CON) 10 MEQ tablet Take 10 mEq by mouth daily. 10/25/19  Yes [provider]  ropinirole (REQUIP) 5 MG tablet Take 5 mg by mouth at bedtime. 10/02/19  Yes [provider]    Physical Exam: Vitals:   11/05/19 1930 11/05/19 2013 11/05/19 2030 11/05/19 2108  BP: 139/80 (!) 118/103 (!) 107/58 116/69  Pulse: (!) 117 (!) 111 (!) 111 (!) 109  Resp: (!) 23 19 18 16   Temp:    99.8 F (37.7 C)  TempSrc:    Oral  SpO2: 99% 94% 93% 94%  Weight:      Height:        Constitutional: NAD, calm  Eyes: PERTLA, lids and conjunctivae normal ENMT: Mucous membranes are moist. Posterior pharynx clear of any exudate or lesions.   Neck: normal, supple, no masses, no thyromegaly Respiratory: Tachypnea, speaking full sentances. No accessory muscle use.  Cardiovascular: S1 & S2 heard, regular rate and rhythm. Pretibial pitting edema, Left > Right. Abdomen: No distension, no tenderness, soft. Bowel sounds active.  Musculoskeletal: no clubbing / cyanosis. No joint deformity upper and lower extremities.   Skin: no significant rashes, lesions, ulcers. Warm, dry, well-perfused. Neurologic: No gross facial asymmetry. Sensation intact. Moving all extremities.  Psychiatric: Alert and oriented to person, place, and situation. Calm and cooperative.     Labs and Imaging on Admission: I have personally reviewed following labs and imaging studies  CBC: Recent Labs  Lab 11/05/19 1821  WBC 10.8*  NEUTROABS 9.2*  HGB 8.1*  HCT 25.8*  MCV 86.6  PLT 778   Basic Metabolic Panel: Recent Labs  Lab 11/05/19 1821  NA 132*  K 4.4  CL 100  CO2 21*  GLUCOSE 149*  BUN 13  CREATININE 1.08*  CALCIUM 8.7*   GFR: Estimated Creatinine Clearance: 44.9 mL/min (A) (by C-G formula based on SCr of 1.08 mg/dL (H)). Liver Function Tests: Recent Labs  Lab 11/05/19 1821  AST 17  ALT 9  ALKPHOS 73  BILITOT 0.5  PROT 6.6  ALBUMIN 3.3*   No results for input(s): LIPASE, AMYLASE in the last 168 hours. No results for input(s): AMMONIA in the last 168 hours. Coagulation Profile: Recent Labs  Lab 11/05/19 1821  INR 1.1   Cardiac Enzymes: No results for input(s): CKTOTAL, CKMB, CKMBINDEX, TROPONINI in the last 168 hours. BNP (last 3 results) No results for  input(s): PROBNP in the last 8760 hours. HbA1C: No results for input(s): HGBA1C in the last 72 hours. CBG: No results for input(s): GLUCAP in the last 168 hours. Lipid Profile: No results for input(s): CHOL, HDL, LDLCALC, TRIG, CHOLHDL, LDLDIRECT in the last 72 hours. Thyroid Function Tests: No results for input(s): TSH, T4TOTAL, FREET4, T3FREE, THYROIDAB in the last 72 hours. Anemia Panel: No results for input(s): VITAMINB12, FOLATE, FERRITIN, TIBC, IRON, RETICCTPCT in the last 72 hours. Urine analysis:    Component Value Date/Time   COLORURINE STRAW (A) 01/15/2019 0955   APPEARANCEUR CLEAR 01/15/2019 0955   LABSPEC 1.005 01/15/2019 0955   PHURINE 6.0 01/15/2019 0955   GLUCOSEU NEGATIVE 01/15/2019 0955   HGBUR NEGATIVE 01/15/2019 0955   HGBUR trace-intact 05/14/2010 1402   BILIRUBINUR NEGATIVE 01/15/2019 [place] 01/15/2019 0955   PROTEINUR NEGATIVE 01/15/2019 0955   UROBILINOGEN 1.0 03/30/2015 1540   NITRITE NEGATIVE 01/15/2019 0955   LEUKOCYTESUR  NEGATIVE 01/15/2019 0955   Sepsis Labs: @LABRCNTIP (procalcitonin:4,lacticidven:4) ) Recent Results (from the past 240 hour(s))  SARS Coronavirus 2 by RT PCR (hospital order, performed in [facility] hospital lab) Nasopharyngeal Nasopharyngeal Swab     Status: None   Collection Time: 11/05/19  6:21 PM   Specimen: Nasopharyngeal Swab  Result Value Ref Range Status   SARS Coronavirus 2 NEGATIVE NEGATIVE Final    Comment: (NOTE) SARS-CoV-2 target nucleic acids are NOT DETECTED.  The SARS-CoV-2 RNA is generally detectable in upper and lower respiratory specimens during the acute phase of infection. The lowest concentration of SARS-CoV-2 viral copies this assay can detect is 250 copies / mL. A negative result does not preclude SARS-CoV-2 infection and should not be used as the sole basis for treatment or other patient management decisions.  A negative result may occur with improper specimen collection / handling, submission of specimen other than nasopharyngeal swab, presence of viral mutation(s) within the areas targeted by this assay, and inadequate number of viral copies (<250 copies / mL). A negative result must be combined with clinical observations, patient history, and epidemiological information.  Fact Sheet for Patients:   StrictlyIdeas.no  Fact Sheet for Healthcare Providers: BankingDealers.co.za  This test is not yet approved or  cleared by the [place] FDA and has been authorized for detection and/or diagnosis of SARS-CoV-2 by FDA under an Emergency Use Authorization (EUA).  This EUA will remain in effect (meaning this test can be used) for the duration of the COVID-19 declaration under Section 564(b)(1) of the Act, 21 U.S.C. section 360bbb-3(b)(1), unless the authorization is terminated or revoked sooner.  Performed at [facility], [place] [address] [place], [place] [address]      Radiological  Exams on Admission: CT Angio Chest PE W/Cm &/Or Wo Cm  Result Date: 11/05/2019 CLINICAL DATA:  Chest pain and shortness of breath. Recent diagnosis of pneumonia. EXAM: CT ANGIOGRAPHY CHEST WITH CONTRAST TECHNIQUE: Multidetector CT imaging of the chest was performed using the standard protocol during bolus administration of intravenous contrast. Multiplanar CT image reconstructions and MIPs were obtained to evaluate the vascular anatomy. CONTRAST:  31mL OMNIPAQUE IOHEXOL 350 MG/ML SOLN COMPARISON:  Radiograph earlier this day.  Chest CT 01/15/2019 FINDINGS: Cardiovascular: There are no filling defects within the pulmonary arteries to suggest pulmonary embolus. Moderate aortic atherosclerosis. Cannot assess for dissection given phase of contrast tailored to pulmonary artery evaluation. Mitral annulus calcifications. Coronary artery calcifications. Heart is upper normal in size. Mediastinum/Nodes: Prominent subcarinal node measures 14 mm, series 4,  image 54. There are shotty bilateral hilar nodes measuring 10 mm on the left and 8 mm on the right. Hypodense 2.3 cm right thyroid nodule. Patulous esophagus with small to moderate hiatal hernia. Lungs/Pleura: Elevated right hemidiaphragm with compressive atelectasis in the right lower and middle lobes. Small bilateral pleural effusions. Patchy ground-glass opacities in the superior segment of the right lower lobe and throughout the right upper lobe suspicious for pneumonia. There is central bronchial thickening with areas of mucous plugging on the left. Questionable mild pulmonary edema with smooth septal thickening. Upper Abdomen: Small to moderate hiatal hernia. Cholecystectomy. No acute or unexpected findings. Musculoskeletal: Diffuse degenerative change in the spine. There are no acute or suspicious osseous abnormalities. Review of the MIP images confirms the above findings. IMPRESSION: 1. No pulmonary embolus. 2. Patchy ground-glass opacities in the superior segment  of the right lower lobe and throughout the right upper lobe suspicious for pneumonia. 3. Elevated right hemidiaphragm with compressive atelectasis in the right middle and lower lobes. 4. Small bilateral pleural effusions. Questionable mild pulmonary edema with smooth septal thickening. 5. Central bronchial thickening with areas of mucous plugging in the left lower lobe. 6. Small to moderate hiatal hernia. 7. Right thyroid nodule measuring 2.3 cm. This was evaluated on thyroid imaging 01/15/2019, please reference that diagnostic ultrasound for further recommendations. Aortic Atherosclerosis (ICD10-I70.0). Electronically Signed   By: Keith Rake M.D.   On: 11/05/2019 20:41   DG Chest Port 1 View  Result Date: 11/05/2019 CLINICAL DATA:  Fever, shortness of breath EXAM: PORTABLE CHEST 1 VIEW COMPARISON:  10/02/2017 FINDINGS: Low lung volumes. Chronic elevation of the right hemidiaphragm with bibasilar atelectasis. Possible small left pleural effusion. No pneumothorax. Atherosclerotic calcification of the aortic knob. IMPRESSION: Low lung volumes with bibasilar atelectasis. Possible small left pleural effusion. Electronically Signed   By: Davina Poke D.O.   On: 11/05/2019 18:45    EKG: Independently reviewed. Sinus tachycardia (rate 123).   Assessment/Plan   1. Pneumonia  - Presents with worsening cough and SOB despite completing amoxicillin last week for suspected PNA  - She is found to be febrile, tachycardic, tachypneic, and saturating upper 80s-low 90s at rest with mild leukocytosis and CT findings concerning for right-sided PNA  - Blood cultures were collected in ED and she was started on Rocephin and azithromycin  - Culture sputum, check strep pneumo and legionella antigens, continue Rocephin and azithromycin    2. Acute CHF  - Patient reports recent bilateral LE edema Lt > Rt and states that she had negative venous dopplers last week and was started on Lasix 20 mg qD but has never had  an echo  - She has peripheral edema on admission, BNP is elevated to 719, and CTA chest features small pleural effusions and suspected mild edema  - She was given Lasix 20 mg IV in ED  - Continue diuresis with Lasix 20 mg IV q12h, follow daily wt and I/Os, check echocardiogram, monitor electrolytes and renal function during diuresis    3. Normocytic anemia  - Hgb is 8.1 on admission, down from 10.9 in August 2020  - She denies melena, hematochezia, or any other bleeding and FOBT is negative in ED  - Type and screen was done in ED  - Check anemia panel, monitor    4. Mild renal insufficiency  - SCr is 1.08 in ED, up from 0.63 in Sept 2020  - She is hypervolemic and being diuresed  - Follow daily chem panel, renally-dose medications  5. Hypertension  - BP on low side in ED, will monitor and treat as-needed only for now    6. Type II DM  - A1c was only 5.2% a year ago  - Pharmacy medication reconciliation pending  - Check CBGs, use low-intensity SSI if needed   7. Hyponatremia - Serum sodium is 132 in setting of hypervolemia and pneumonia  - Monitor while diuresing   8. Elevated troponin  - HS troponin is 54 in ED without chest pain or acute ischemic features on EKG  - Continue cardiac monitoring, check 2nd troponin, follow-up echo, treat underlying PNA and CHF as above    ADDENDUM: Troponin has risen significantly. Patient resting comfortably, continues to deny any chest pain or nausea and reports that her SOB has improved since admission. Repeat EKG with improvement in tachycardia and no new ischemic features. Continue to treat underlying illness, trend troponin, check echocardiogram, hold Plaquenil.    DVT prophylaxis: SCDs Code Status: Full  Family Communication: Husband updated at bedside Disposition Plan:  Patient is from: Home  Anticipated d/c is to: Home  Anticipated d/c date is: 6/23 or 11/09/19 Patient currently: Being started on IV antibiotics for PNA with failed  outpatient tx and will get echocardiogram for new CHF  Consults called: None  Admission status: Inpatient     Vianne Bulls, MD [facility] Pager: See [email or web address]  If 7AM-7PM, please contact the daytime attending [email or web address]  11/05/2019, 9:57 PM

## 2019-11-05 NOTE — ED Notes (Addendum)
Attempted for 2nd IV unsuccessfully.   Both sets of cultures obtained before start IV antibiotics.   Patient placed on Pure wick and encouraged to void.

## 2019-11-05 NOTE — ED Triage Notes (Signed)
Patient arrived via [facility] from home.    Called out for shob.   Last 2 weeks been diagnosed with pneumnia.   Hemoglobin went from 11 to 8 per patient.    Woke up this morning with shob with a productive/dry cough     A/Ox4 Ambulatory with ems

## 2019-11-06 ENCOUNTER — Encounter (HOSPITAL_COMMUNITY): Payer: Self-pay | Admitting: Family Medicine

## 2019-11-06 ENCOUNTER — Inpatient Hospital Stay (HOSPITAL_COMMUNITY): Payer: Medicare HMO

## 2019-11-06 DIAGNOSIS — I5033 Acute on chronic diastolic (congestive) heart failure: Secondary | ICD-10-CM

## 2019-11-06 DIAGNOSIS — R0602 Shortness of breath: Secondary | ICD-10-CM

## 2019-11-06 DIAGNOSIS — I248 Other forms of acute ischemic heart disease: Secondary | ICD-10-CM

## 2019-11-06 DIAGNOSIS — I2510 Atherosclerotic heart disease of native coronary artery without angina pectoris: Secondary | ICD-10-CM

## 2019-11-06 LAB — URINALYSIS, ROUTINE W REFLEX MICROSCOPIC
Bilirubin Urine: NEGATIVE
Glucose, UA: NEGATIVE mg/dL
Hgb urine dipstick: NEGATIVE
Ketones, ur: NEGATIVE mg/dL
Leukocytes,Ua: NEGATIVE
Nitrite: NEGATIVE
Protein, ur: NEGATIVE mg/dL
Specific Gravity, Urine: 1.011 (ref 1.005–1.030)
pH: 6 (ref 5.0–8.0)

## 2019-11-06 LAB — CBC
HCT: 22.9 % — ABNORMAL LOW (ref 36.0–46.0)
Hemoglobin: 7.1 g/dL — ABNORMAL LOW (ref 12.0–15.0)
MCH: 26.8 pg (ref 26.0–34.0)
MCHC: 31 g/dL (ref 30.0–36.0)
MCV: 86.4 fL (ref 80.0–100.0)
Platelets: 228 10*3/uL (ref 150–400)
RBC: 2.65 MIL/uL — ABNORMAL LOW (ref 3.87–5.11)
RDW: 14.2 % (ref 11.5–15.5)
WBC: 7.8 10*3/uL (ref 4.0–10.5)
nRBC: 0 % (ref 0.0–0.2)

## 2019-11-06 LAB — RETICULOCYTES
Immature Retic Fract: 21.8 % — ABNORMAL HIGH (ref 2.3–15.9)
RBC.: 2.57 MIL/uL — ABNORMAL LOW (ref 3.87–5.11)
Retic Count, Absolute: 51.9 10*3/uL (ref 19.0–186.0)
Retic Ct Pct: 2 % (ref 0.4–3.1)

## 2019-11-06 LAB — BASIC METABOLIC PANEL
Anion gap: 10 (ref 5–15)
BUN: 14 mg/dL (ref 8–23)
CO2: 24 mmol/L (ref 22–32)
Calcium: 8.6 mg/dL — ABNORMAL LOW (ref 8.9–10.3)
Chloride: 100 mmol/L (ref 98–111)
Creatinine, Ser: 1.18 mg/dL — ABNORMAL HIGH (ref 0.44–1.00)
GFR calc Af Amer: 53 mL/min — ABNORMAL LOW (ref >60)
GFR calc non Af Amer: 46 mL/min — ABNORMAL LOW (ref >60)
Glucose, Bld: 130 mg/dL — ABNORMAL HIGH (ref 70–99)
Potassium: 4.4 mmol/L (ref 3.5–5.1)
Sodium: 134 mmol/L — ABNORMAL LOW (ref 135–145)

## 2019-11-06 LAB — GLUCOSE, CAPILLARY
Glucose-Capillary: 104 mg/dL — ABNORMAL HIGH (ref 70–99)
Glucose-Capillary: 100 mg/dL — ABNORMAL HIGH (ref 70–99)
Glucose-Capillary: 92 mg/dL (ref 70–99)
Glucose-Capillary: 130 mg/dL — ABNORMAL HIGH (ref 70–99)

## 2019-11-06 LAB — IRON AND TIBC
Iron: 20 ug/dL — ABNORMAL LOW (ref 28–170)
Saturation Ratios: 6 % — ABNORMAL LOW (ref 10.4–31.8)
TIBC: 311 ug/dL (ref 250–450)
UIBC: 291 ug/dL

## 2019-11-06 LAB — ECHOCARDIOGRAM COMPLETE
Height: 61 in
Weight: 2970.04 oz

## 2019-11-06 LAB — STREP PNEUMONIAE URINARY ANTIGEN: Strep Pneumo Urinary Antigen: NEGATIVE

## 2019-11-06 LAB — MAGNESIUM: Magnesium: 1.9 mg/dL (ref 1.7–2.4)

## 2019-11-06 LAB — VITAMIN B12: Vitamin B-12: 583 pg/mL (ref 180–914)

## 2019-11-06 LAB — TROPONIN I (HIGH SENSITIVITY)
Troponin I (High Sensitivity): 1226 ng/L (ref <18)
Troponin I (High Sensitivity): 1787 ng/L (ref <18)

## 2019-11-06 LAB — FOLATE: Folate: 53.5 ng/mL (ref >5.9)

## 2019-11-06 LAB — FERRITIN: Ferritin: 18 ng/mL (ref 11–307)

## 2019-11-06 MED ORDER — GABAPENTIN 400 MG PO CAPS
400.0000 mg | ORAL_CAPSULE | Freq: Three times a day (TID) | ORAL | Status: DC
  Administered 2019-11-06 – 2019-11-09 (×10): 400 mg via ORAL
  Filled 2019-11-06 (×8): qty 1
  Filled 2019-11-06: qty 4
  Filled 2019-11-06: qty 1

## 2019-11-06 MED ORDER — ONDANSETRON HCL 4 MG PO TABS: 4.0000 mg | ORAL_TABLET | Freq: Four times a day (QID) | ORAL | Status: DC | PRN

## 2019-11-06 MED ORDER — ATORVASTATIN CALCIUM 20 MG PO TABS
20.0000 mg | ORAL_TABLET | Freq: Every day | ORAL | Status: DC
  Administered 2019-11-06 – 2019-11-09 (×4): 20 mg via ORAL
  Filled 2019-11-06 (×4): qty 1

## 2019-11-06 MED ORDER — INSULIN ASPART 100 UNIT/ML ~~LOC~~ SOLN: 0.0000 [IU] | Freq: Three times a day (TID) | SUBCUTANEOUS | Status: DC

## 2019-11-06 MED ORDER — ROPINIROLE HCL 1 MG PO TABS
5.0000 mg | ORAL_TABLET | Freq: Every day | ORAL | Status: DC
  Administered 2019-11-06 – 2019-11-08 (×3): 5 mg via ORAL
  Filled 2019-11-06 (×3): qty 5

## 2019-11-06 MED ORDER — SODIUM CHLORIDE 0.9% FLUSH
3.0000 mL | Freq: Two times a day (BID) | INTRAVENOUS | Status: DC
  Administered 2019-11-09: 3 mL via INTRAVENOUS

## 2019-11-06 MED ORDER — FUROSEMIDE 20 MG PO TABS
20.0000 mg | ORAL_TABLET | Freq: Every day | ORAL | Status: DC
  Administered 2019-11-07 – 2019-11-09 (×3): 20 mg via ORAL
  Filled 2019-11-06 (×3): qty 1

## 2019-11-06 MED ORDER — ACETAMINOPHEN 650 MG RE SUPP: 650.0000 mg | Freq: Four times a day (QID) | RECTAL | Status: DC | PRN

## 2019-11-06 MED ORDER — SODIUM CHLORIDE 0.9% FLUSH
3.0000 mL | Freq: Two times a day (BID) | INTRAVENOUS | Status: DC
  Administered 2019-11-06 – 2019-11-08 (×5): 3 mL via INTRAVENOUS

## 2019-11-06 MED ORDER — SODIUM CHLORIDE 0.9 % IV SOLN
250.0000 mL | INTRAVENOUS | Status: DC | PRN
  Administered 2019-11-06 (×2): 250 mL via INTRAVENOUS

## 2019-11-06 MED ORDER — BISACODYL 5 MG PO TBEC: 5.0000 mg | DELAYED_RELEASE_TABLET | Freq: Every day | ORAL | Status: DC | PRN

## 2019-11-06 MED ORDER — SODIUM CHLORIDE 0.9% FLUSH: 3.0000 mL | INTRAVENOUS | Status: DC | PRN

## 2019-11-06 MED ORDER — ESCITALOPRAM OXALATE 10 MG PO TABS
5.0000 mg | ORAL_TABLET | Freq: Every day | ORAL | Status: DC
  Administered 2019-11-06 – 2019-11-09 (×4): 5 mg via ORAL
  Filled 2019-11-06 (×4): qty 1

## 2019-11-06 MED ORDER — ALBUTEROL SULFATE (2.5 MG/3ML) 0.083% IN NEBU: 2.5000 mg | INHALATION_SOLUTION | RESPIRATORY_TRACT | Status: DC | PRN

## 2019-11-06 MED ORDER — SODIUM CHLORIDE 0.9 % IV SOLN
500.0000 mg | INTRAVENOUS | Status: DC
  Administered 2019-11-06: 500 mg via INTRAVENOUS
  Filled 2019-11-06: qty 500

## 2019-11-06 MED ORDER — SENNOSIDES-DOCUSATE SODIUM 8.6-50 MG PO TABS: 1.0000 | ORAL_TABLET | Freq: Every evening | ORAL | Status: DC | PRN

## 2019-11-06 MED ORDER — SODIUM CHLORIDE 0.9% FLUSH: 3.0000 mL | Freq: Two times a day (BID) | INTRAVENOUS | Status: DC

## 2019-11-06 MED ORDER — ONDANSETRON HCL 4 MG/2ML IJ SOLN: 4.0000 mg | Freq: Four times a day (QID) | INTRAMUSCULAR | Status: DC | PRN

## 2019-11-06 MED ORDER — BENZONATATE 100 MG PO CAPS
100.0000 mg | ORAL_CAPSULE | Freq: Four times a day (QID) | ORAL | Status: DC | PRN
  Administered 2019-11-08: 100 mg via ORAL
  Filled 2019-11-06: qty 1

## 2019-11-06 MED ORDER — FUROSEMIDE 10 MG/ML IJ SOLN
20.0000 mg | Freq: Two times a day (BID) | INTRAMUSCULAR | Status: DC
  Administered 2019-11-06: 20 mg via INTRAVENOUS
  Filled 2019-11-06: qty 2

## 2019-11-06 MED ORDER — SODIUM CHLORIDE 0.9 % IV SOLN
2.0000 g | INTRAVENOUS | Status: DC
  Administered 2019-11-06 – 2019-11-08 (×3): 2 g via INTRAVENOUS
  Filled 2019-11-06 (×3): qty 2

## 2019-11-06 MED ORDER — GUAIFENESIN ER 600 MG PO TB12
1200.0000 mg | ORAL_TABLET | Freq: Two times a day (BID) | ORAL | Status: DC
  Administered 2019-11-06 – 2019-11-09 (×7): 1200 mg via ORAL
  Filled 2019-11-06 (×7): qty 2

## 2019-11-06 MED ORDER — ACETAMINOPHEN 325 MG PO TABS
650.0000 mg | ORAL_TABLET | Freq: Four times a day (QID) | ORAL | Status: DC | PRN
  Administered 2019-11-06 – 2019-11-09 (×2): 650 mg via ORAL
  Filled 2019-11-06 (×2): qty 2

## 2019-11-06 MED ORDER — PANTOPRAZOLE SODIUM 40 MG PO TBEC
40.0000 mg | DELAYED_RELEASE_TABLET | Freq: Two times a day (BID) | ORAL | Status: DC
  Administered 2019-11-06 – 2019-11-09 (×7): 40 mg via ORAL
  Filled 2019-11-06 (×7): qty 1

## 2019-11-06 MED ORDER — HYDROCODONE-ACETAMINOPHEN 5-325 MG PO TABS
1.0000 | ORAL_TABLET | Freq: Four times a day (QID) | ORAL | Status: DC | PRN
  Administered 2019-11-06: 1 via ORAL
  Filled 2019-11-06: qty 1

## 2019-11-06 NOTE — Plan of Care (Signed)

## 2019-11-06 NOTE — Progress Notes (Signed)
   11/06/19 0729  Assess: MEWS Score  Temp 100.1 F (37.8 C)  BP 114/77  Pulse Rate (!) 113  Resp 18  Assess: MEWS Score  MEWS Temp 0  MEWS Systolic 0  MEWS Pulse 2  MEWS RR 0  MEWS LOC 0  MEWS Score 2  MEWS Score Color Yellow  Assess: if the MEWS score is Yellow or Red  Were vital signs taken at a resting state? Yes  Focused Assessment Documented focused assessment  Early Detection of Sepsis Score *See Row Information* High  MEWS guidelines implemented *See Row Information* Yes  Treat  MEWS Interventions Administered scheduled meds/treatments  Take Vital Signs  Increase Vital Sign Frequency  Yellow: Q 2hr X 2 then Q 4hr X 2, if remains yellow, continue Q 4hrs  Escalate  MEWS: Escalate Yellow: discuss with charge nurse/RN and consider discussing with provider and RRT  Notify: Charge Nurse/RN  Name of Charge Nurse/RN Notified Jarrett Soho  Date Charge Nurse/RN Notified 11/06/19  Time Charge Nurse/RN Notified 0800  Document  Patient Outcome Other (Comment) (Conitnue to monitor)  Progress note created (see row info) Yes  Patient started on yellow MEWS protocol. PRN meds given, will continue to monitor.

## 2019-11-06 NOTE — Progress Notes (Signed)
PROGRESS NOTE    Chloe Harding  HUD:[id number] DOB: 1946-02-14 DOA: 11/05/2019 PCP: [name], NP  Brief Narrative:  HPI per Dr. Mitzi Hansen om 11/05/19 Chloe Harding is a 74 y.o. female with medical history significant for hypertension, type 2 diabetes mellitus, and arthritis, now presenting to the emergency department with shortness of breath and cough.  Patient reports that she developed shortness of breath and cough, as well as bilateral lower extremity swelling approximately 2 weeks ago, was noted to have more swelling on the left than right and reports undergoing venous Dopplers of the lower extremities that were negative for DVT.  She was also started on Lasix 20 mg daily in the past couple weeks as well as amoxicillin for suspected pneumonia.  Despite this, patient reports continued worsening in her dyspnea and cough.  She denies any chest pain, melena, hematochezia, or abdominal pain.  ED Course: Upon arrival to the ED, patient is found to be febrile to 38.3 C, saturating upper 80s on room air, tachypneic, tachycardic to 130, and with blood pressure 107/58.  EKG features sinus tachycardia with rate 123.  CTA chest is negative for PE but concerning for pneumonia in the right upper and right lower lobes, small bilateral pleural effusions, and suspected mild pulmonary edema.  Chemistry panel notable for sodium 132 and creatinine 1.08, from 0.63 a year ago.  CBC notable for mild leukocytosis and normocytic anemia with hemoglobin 8.1, down from 10.9 last August.  BNP is elevated 719, troponin is 54, and lactic acid is reassuringly normal.  Fecal occult blood testing was negative and Covid PCR is negative.  Blood cultures were collected and the patient was treated with Rocephin, azithromycin, and Lasix in the ED.  **Interim History Cardiology was consulted given her elevated troponins.  Echocardiogram is still pending to be done.  She feels a little bit better and will repeat a chest x-ray in  a.m.  Assessment & Plan:   Principal Problem:   Pneumonia Active Problems:   Type 2 diabetes mellitus (HCC)   ANEMIA   Mild renal insufficiency   Hypertension   Hyponatremia   Acute CHF (congestive heart failure) ([place])  Community Acquired Pneumonia  -Presents with worsening cough and SOB despite completing amoxicillin last week for suspected PNA  -She is found to be febrile, tachycardic, tachypneic, and saturating upper 80s-low 90s at rest with mild leukocytosis and CT findings concerning for right-sided PNA  -Check SLP to ensure that she is not aspirating -Blood cultures were collected in ED and she was started on Rocephin and azithromycin  and will continue - Culture sputum, check strep pneumo and legionella antigens,  -We will add albuterol 2.5 mg nebs every 4 hours as needed -Continue with benzonatate 100 g p.o. 4 times a day as needed for cough -Add Guaifenesin 1200 mg po BID, Flutter Valve and Incentive Spirometry  -Repeat CXR in the AM     Acute CHF, unknown type -Patient reports recent bilateral LE edema Lt > Rt and states that she had negative venous dopplers last week and was started on Lasix 20 mg qD but has never had an echo  -She has peripheral edema on admission, BNP is elevated to 719, and CTA chest features small pleural effusions and suspected mild edema  -She was given Lasix 20 mg IV in ED  -Continue diuresis with Lasix 20 mg IV q12h,  -follow daily wt and I/Os, check echocardiogram, monitor electrolytes and renal function during diuresis  -She is  2.069 L since admission -Continue to monitor for signs or symptoms of volume overload -Repeat chest x-ray in a.m.  Normocytic Anemia  -Hgb is 8.1 on admission, down from 10.9 in August 2020  -She denies melena, hematochezia, or any other bleeding and FOBT is negative in ED  -Type and screen was done in ED  -Anemia panel was checked and showed an iron level of 20, U IBC of 291, TIBC of 311, saturation ratio of 6%,  ferritin level of 18, folate level 53.5, and vitamin B12 level of 583  Mild renal insufficiency/AKI Metabolic acidosis, mild -SCr is 1.08 in ED, up from 0.63 in Sept 2020  - She is hypervolemic and being diuresed  -Patient's CO2 on admission was 21, anion gap is 11, chloride level is 100, now improved with a CO2 of 24, chloride level 100, and anion gap of 10 -Patient BUN/creatinine slightly worsened has gone from 13/1.08 and is now 14/1.18 -Avoid nephrotoxic medications, hypotension, contrast dyes and renally dose medications -Continue with diuresis with IV Lasix 20 mg -Continue to monitor and trend CMP   Hypertension  -BP on low side in ED -Now remains and is 115/73, will monitor and treat as-needed only for now    Type II DM  - A1c was only 5.2% a year ago  - Pharmacy medication reconciliation was pending but she takes Januvia 100 mg p.o. daily which we will hold  -Check CBGs and she was started on a sensitive NovoLog sign scale insulin AC -CBGs have been ranging from 100-104 -May need to repeat her hemoglobin A1c  Hyponatremia -Serum sodium is 132 in setting of hypervolemia and pneumonia on admission -Continue to Monitor while diuresing  -Sodium is now improved and is 134 -Repeat CMP in a.m.  Elevated Troponin, worsening   -HS troponin is 54 in ED without chest pain or acute ischemic features on EKG  -Repeat troponins are trending up and repeat was 1226 and following that the next repeat was 1787 -Continue cardiac monitoring,  -follow-up echo, treat underlying PNA and CHF as above  -Denies any chest pain it could be demand ischemia but will have cardiology evaluate for further evaluation recommendations and further work-up  Obesity -Estimated body mass index is 35.07 kg/m as calculated from the following:   Height as of this encounter: 5\' 1"  (1.549 m).   Weight as of this encounter: 84.2 kg. -Continued Weight Loss and Dietary Counseling  GERD -C/w PPI with  Pantoprazole 40 mg po BID   Depression -C/w Escitalopram 5 mg po Daily  HLD -C/w Atorvastatin 20 mg po Daily   DVT prophylaxis: SCDs Code Status: FULL CODE  Family Communication: Discussed with family present at bedside  Disposition Plan: Pending further clinical improvement of her respiratory status and evaluation by cardiology for further work-up  Status is: Inpatient  Remains inpatient appropriate because:Ongoing diagnostic testing needed not appropriate for outpatient work up, Unsafe d/c plan, IV treatments appropriate due to intensity of illness or inability to take PO and Inpatient level of care appropriate due to severity of illness   Dispo: The patient is from: Home              Anticipated d/c is to: Home              Anticipated d/c date is: 2 days              Patient currently is not medically stable to d/c.  Consultants:   Cardiology    Procedures:  ECHOCARDIOGRAM   Antimicrobials:  Anti-infectives (From admission, onward)   Start     Dose/Rate Route Frequency Ordered Stop   11/06/19 1800  azithromycin (ZITHROMAX) 500 mg in sodium chloride 0.9 % 250 mL IVPB     Discontinue     500 mg 250 mL/hr over 60 Minutes Intravenous Every 24 hours 11/06/19 0000 11/10/19 1759   11/06/19 1000  cefTRIAXone (ROCEPHIN) 2 g in sodium chloride 0.9 % 100 mL IVPB     Discontinue     2 g 200 mL/hr over 30 Minutes Intravenous Every 24 hours 11/06/19 0000 11/10/19 0959   11/05/19 1830  cefTRIAXone (ROCEPHIN) 1 g in sodium chloride 0.9 % 100 mL IVPB        1 g 200 mL/hr over 30 Minutes Intravenous  Once 11/05/19 1829 11/05/19 1915   11/05/19 1830  azithromycin (ZITHROMAX) 500 mg in sodium chloride 0.9 % 250 mL IVPB        500 mg 250 mL/hr over 60 Minutes Intravenous  Once 11/05/19 1829 11/05/19 2051     Subjective: Seen and examined at bedside and she is feeling a little bit better not short of breath.  No chest pain.  Denies lightheadedness or dizziness.  Still coughing up some  yellowish phlegm.  No other concerns or complaints at this time.  Objective: Vitals:   11/06/19 0729 11/06/19 0858 11/06/19 0933 11/06/19 1136  BP: 114/77 128/75  115/73  Pulse: (!) 113 (!) 113  (!) 104  Resp: 18 16  16   Temp: 100.1 F (37.8 C) 100.3 F (37.9 C) 99.9 F (37.7 C) 99.4 F (37.4 C)  TempSrc: Oral Oral Oral Oral  SpO2:  95%  98%  Weight:      Height:        Intake/Output Summary (Last 24 hours) at 11/06/2019 1227 Last data filed at 11/06/2019 1138 Gross per 24 hour  Intake 830.9 ml  Output 2900 ml  Net -2069.1 ml   Filed Weights   11/05/19 1915 11/05/19 2332  Weight: 79.4 kg 84.2 kg   Examination: Physical Exam:  Constitutional: WN/WD obese Caucasian female currently in NAD and appears calm and comfortable Eyes: Lids and conjunctivae normal, sclerae anicteric  ENMT: External Ears, Nose appear normal. Grossly normal hearing.  Neck: Appears normal, supple, no cervical masses, normal ROM, no appreciable thyromegaly no JVD Respiratory: Diminished to auscultation bilaterally with coarse breath sounds and some mild rhonchi and crackles Normal respiratory effort and patient is not tachypenic. No accessory muscle use.  Cardiovascular: RRR, no murmurs / rubs / gallops. S1 and S2 auscultated.  Left leg is bigger than the right leg Abdomen: Soft, non-tender, distended secondary to body habitus. Bowel sounds positive x4.  GU: Deferred. Musculoskeletal: No clubbing / cyanosis of digits/nails. No joint deformity upper and lower extremities.  Skin: No rashes, lesions, ulcers on limited skin evaluation. No induration; Warm and dry.  Neurologic: CN 2-12 grossly intact with no focal deficits. Romberg sign and cerebellar reflexes not assessed.  Psychiatric: Normal judgment and insight. Alert and oriented x 3. Normal mood and appropriate affect.   Data Reviewed: I have personally reviewed following labs and imaging studies  CBC: Recent Labs  Lab 11/05/19 1821 11/06/19 0058    WBC 10.8* 7.8  NEUTROABS 9.2*  --   HGB 8.1* 7.1*  HCT 25.8* 22.9*  MCV 86.6 86.4  PLT 296 287   Basic Metabolic Panel: Recent Labs  Lab 11/05/19 1821 11/06/19 0058  NA 132* 134*  K 4.4  4.4  CL 100 100  CO2 21* 24  GLUCOSE 149* 130*  BUN 13 14  CREATININE 1.08* 1.18*  CALCIUM 8.7* 8.6*  MG  --  1.9   GFR: Estimated Creatinine Clearance: 42.5 mL/min (A) (by C-G formula based on SCr of 1.18 mg/dL (H)). Liver Function Tests: Recent Labs  Lab 11/05/19 1821  AST 17  ALT 9  ALKPHOS 73  BILITOT 0.5  PROT 6.6  ALBUMIN 3.3*   No results for input(s): LIPASE, AMYLASE in the last 168 hours. No results for input(s): AMMONIA in the last 168 hours. Coagulation Profile: Recent Labs  Lab 11/05/19 1821  INR 1.1   Cardiac Enzymes: No results for input(s): CKTOTAL, CKMB, CKMBINDEX, TROPONINI in the last 168 hours. BNP (last 3 results) No results for input(s): PROBNP in the last 8760 hours. HbA1C: No results for input(s): HGBA1C in the last 72 hours. CBG: Recent Labs  Lab 11/06/19 0754 11/06/19 1155  GLUCAP 104* 100*   Lipid Profile: No results for input(s): CHOL, HDL, LDLCALC, TRIG, CHOLHDL, LDLDIRECT in the last 72 hours. Thyroid Function Tests: No results for input(s): TSH, T4TOTAL, FREET4, T3FREE, THYROIDAB in the last 72 hours. Anemia Panel: Recent Labs    11/06/19 0058  VITAMINB12 583  FOLATE 53.5  FERRITIN 18  TIBC 311  IRON 20*  RETICCTPCT 2.0   Sepsis Labs: Recent Labs  Lab 11/05/19 1821 11/05/19 2111  LATICACIDVEN 1.3 1.1    Recent Results (from the past 240 hour(s))  Blood Culture (routine x 2)     Status: None (Preliminary result)   Collection Time: 11/05/19  6:21 PM   Specimen: BLOOD  Result Value Ref Range Status   Specimen Description   Final    BLOOD RIGHT ANTECUBITAL Performed at [facility] [address] [place], [place] [address]    Special Requests   Final    BOTTLES DRAWN AEROBIC AND ANAEROBIC Blood  Culture adequate volume Performed at [facility] [address], [place], [place] [address]    Culture   Final    NO GROWTH < 12 HOURS Performed at [facility] [address] [place], [place] [address]    Report Status PENDING  Incomplete  SARS Coronavirus 2 by RT PCR (hospital order, performed in [facility] hospital lab) Nasopharyngeal Nasopharyngeal Swab     Status: None   Collection Time: 11/05/19  6:21 PM   Specimen: Nasopharyngeal Swab  Result Value Ref Range Status   SARS Coronavirus 2 NEGATIVE NEGATIVE Final    Comment: (NOTE) SARS-CoV-2 target nucleic acids are NOT DETECTED.  The SARS-CoV-2 RNA is generally detectable in upper and lower respiratory specimens during the acute phase of infection. The lowest concentration of SARS-CoV-2 viral copies this assay can detect is 250 copies / mL. A negative result does not preclude SARS-CoV-2 infection and should not be used as the sole basis for treatment or other patient management decisions.  A negative result may occur with improper specimen collection / handling, submission of specimen other than nasopharyngeal swab, presence of viral mutation(s) within the areas targeted by this assay, and inadequate number of viral copies (<250 copies / mL). A negative result must be combined with clinical observations, patient history, and epidemiological information.  Fact Sheet for Patients:   StrictlyIdeas.no  Fact Sheet for Healthcare Providers: BankingDealers.co.za  This test is not yet approved or  cleared by the [place] FDA and has been authorized for detection and/or diagnosis of SARS-CoV-2 by  FDA under an Emergency Use Authorization (EUA).  This EUA will remain in effect (meaning this test can be used) for the duration of the COVID-19 declaration under Section 564(b)(1) of the Act, 21 U.S.C. section 360bbb-3(b)(1), unless the authorization is  terminated or revoked sooner.  Performed at [facility], [place] [address] [place], [place] [address]   Blood Culture (routine x 2)     Status: None (Preliminary result)   Collection Time: 11/05/19  6:41 PM   Specimen: BLOOD LEFT FOREARM  Result Value Ref Range Status   Specimen Description   Final    BLOOD LEFT FOREARM Performed at [facility] [address], [place], [place] [address]    Special Requests   Final    BOTTLES DRAWN AEROBIC ONLY Blood Culture results may not be optimal due to an inadequate volume of blood received in culture bottles Performed at [facility] [address], [place], [place] [address]    Culture   Final    NO GROWTH < 12 HOURS Performed at [facility] [address] [place], [place] [address]    Report Status PENDING  Incomplete     RN Pressure Injury Documentation:     Estimated body mass index is 35.07 kg/m as calculated from the following:   Height as of this encounter: 5\' 1"  (1.549 m).   Weight as of this encounter: 84.2 kg.  Malnutrition Type:      Malnutrition Characteristics:      Nutrition Interventions:    Radiology Studies: CT Angio Chest PE W/Cm &/Or Wo Cm  Result Date: 11/05/2019 CLINICAL DATA:  Chest pain and shortness of breath. Recent diagnosis of pneumonia. EXAM: CT ANGIOGRAPHY CHEST WITH CONTRAST TECHNIQUE: Multidetector CT imaging of the chest was performed using the standard protocol during bolus administration of intravenous contrast. Multiplanar CT image reconstructions and MIPs were obtained to evaluate the vascular anatomy. CONTRAST:  39mL OMNIPAQUE IOHEXOL 350 MG/ML SOLN COMPARISON:  Radiograph earlier this day.  Chest CT 01/15/2019 FINDINGS: Cardiovascular: There are no filling defects within the pulmonary arteries to suggest pulmonary embolus. Moderate aortic atherosclerosis. Cannot assess for dissection given phase of contrast tailored to  pulmonary artery evaluation. Mitral annulus calcifications. Coronary artery calcifications. Heart is upper normal in size. Mediastinum/Nodes: Prominent subcarinal node measures 14 mm, series 4, image 54. There are shotty bilateral hilar nodes measuring 10 mm on the left and 8 mm on the right. Hypodense 2.3 cm right thyroid nodule. Patulous esophagus with small to moderate hiatal hernia. Lungs/Pleura: Elevated right hemidiaphragm with compressive atelectasis in the right lower and middle lobes. Small bilateral pleural effusions. Patchy ground-glass opacities in the superior segment of the right lower lobe and throughout the right upper lobe suspicious for pneumonia. There is central bronchial thickening with areas of mucous plugging on the left. Questionable mild pulmonary edema with smooth septal thickening. Upper Abdomen: Small to moderate hiatal hernia. Cholecystectomy. No acute or unexpected findings. Musculoskeletal: Diffuse degenerative change in the spine. There are no acute or suspicious osseous abnormalities. Review of the MIP images confirms the above findings. IMPRESSION: 1. No pulmonary embolus. 2. Patchy ground-glass opacities in the superior segment of the right lower lobe and throughout the right upper lobe suspicious for pneumonia. 3. Elevated right hemidiaphragm with compressive atelectasis in the right middle and lower lobes. 4. Small bilateral pleural effusions. Questionable mild pulmonary edema with smooth septal thickening. 5. Central bronchial thickening with areas of mucous plugging in the left  lower lobe. 6. Small to moderate hiatal hernia. 7. Right thyroid nodule measuring 2.3 cm. This was evaluated on thyroid imaging 01/15/2019, please reference that diagnostic ultrasound for further recommendations. Aortic Atherosclerosis (ICD10-I70.0). Electronically Signed   By: Keith Rake M.D.   On: 11/05/2019 20:41   DG Chest Port 1 View  Result Date: 11/05/2019 CLINICAL DATA:  Fever,  shortness of breath EXAM: PORTABLE CHEST 1 VIEW COMPARISON:  10/02/2017 FINDINGS: Low lung volumes. Chronic elevation of the right hemidiaphragm with bibasilar atelectasis. Possible small left pleural effusion. No pneumothorax. Atherosclerotic calcification of the aortic knob. IMPRESSION: Low lung volumes with bibasilar atelectasis. Possible small left pleural effusion. Electronically Signed   By: Davina Poke D.O.   On: 11/05/2019 18:45   Scheduled Meds:  atorvastatin  20 mg Oral Daily   escitalopram  5 mg Oral Daily   furosemide  20 mg Intravenous Q12H   gabapentin  400 mg Oral TID   insulin aspart  0-9 Units Subcutaneous TID WC   pantoprazole  40 mg Oral BID   ropinirole  5 mg Oral QHS   sodium chloride flush  3 mL Intravenous Q12H   sodium chloride flush  3 mL Intravenous Q12H   sodium chloride flush  3 mL Intravenous Q12H   Continuous Infusions:  sodium chloride 250 mL (11/06/19 1058)   azithromycin     cefTRIAXone (ROCEPHIN)  IV 2 g (11/06/19 1059)    LOS: 1 day   Kerney Elbe, DO [facility] PAGER is on AMION  If 7PM-7AM, please contact night-coverage [email or web address]

## 2019-11-06 NOTE — Progress Notes (Addendum)
CRITICAL VALUE ALERT  Critical Value:  Troponin 1226     Date & Time Notied:  11/06/19 at 0420  Provider Notified: Opyd  Orders Received/Actions taken: MD to see patient.

## 2019-11-06 NOTE — Consult Note (Signed)
Cardiology Consultation:   Patient ID: Chloe Harding MRN: [id number]; DOB: 11-07-1945  Admit date: 11/05/2019 Date of Consult: 11/06/2019  Primary Care Provider: [name], NP [facility] Cardiologist: No primary care provider on file. new  Dr. Oval Linsey [facility] Electrophysiologist:  None    Patient Profile:   Chloe Harding is a 74 y.o. female with a hx of DM, HLD, HTN and renal disorder and recent PNA  Admitted 11/05/19 with SOB who is being seen today for the evaluation of CHF and elevated troponin at the request of Dr. Alfredia Ferguson..  History of Present Illness:   [name] with no known CAD but HTN, HLD and DM and recent PNA presented to ER yesterday with SOB.   She had SOB, cough and lower ext edema that began 2 weeks ago and did hve venous doppler neg for DVT.  Started lasix and amoxicillin for suspected PNA.  Her symptoms increased despite this.     In ER temp of 100.9 ST  Na 132 K+ 4.4 BUN 13 Cr 1.08 and today 1.18 LFTs normal  BNP 719 and  Hs troponin 54, 345, 1226, 1787 (pk at 0800 this AM) Iron 20, UIBC 291, iron sat 6, ferritin 18, folate 53 Hgb on arrival 8.1 now 7.1 with WBC 10.8 yesterday now 7.8, plts 228  COVID neg, neg u/a Blood cultures pending  Stool heme neg  EKG:  The EKG was personally reviewed and demonstrates:  ST at 123 low voltage pericardial leads second EKG ST at 107 and no other changes no acute changes in ST from 01/15/2019 Telemetry:  Telemetry was personally reviewed and demonstrates:  ST  CTA chest : Cardiovascular: There are no filling defects within the pulmonary arteries to suggest pulmonary embolus. Moderate aortic atherosclerosis. Cannot assess for dissection given phase of contrast tailored to pulmonary artery evaluation. Mitral annulus calcifications. Coronary artery calcifications. Heart is upper normal in size IMPRESSION: 1. No pulmonary embolus. 2. Patchy ground-glass opacities in the superior segment of the right lower  lobe and throughout the right upper lobe suspicious for pneumonia. 3. Elevated right hemidiaphragm with compressive atelectasis in the right middle and lower lobes. 4. Small bilateral pleural effusions. Questionable mild pulmonary edema with smooth septal thickening. 5. Central bronchial thickening with areas of mucous plugging in the left lower lobe. 6. Small to moderate hiatal hernia. 7. Right thyroid nodule measuring 2.3 cm. This was evaluated on thyroid imaging 01/15/2019, please reference that diagnostic ultrasound for further recommendations. Aortic Atherosclerosis (ICD10-I70.0).  Echo pending   Currently BP 128/75 T at 0801 100.3 P 106   She tells me she walked up hill last week after MD appt at [facility] and she was so SOB and had such chest pain she did not think she would make it.  She rested in car until better but it took 2 days to feel back to her normal.   No chest pain now.  Breathing is much better, slept flat in bed last night.   Strong FH on CAD with 3 sisters and brother all died with heart attacks and CHF.  Her husband was in room with her for exam.       Past Medical History:  Diagnosis Date  . Arthritis   . Depression   . Diabetes mellitus   . Hiatal hernia   . Hypercholesteremia   . Hypertension   . Reflux   . Renal disorder    shutting down 4 years ago    Past Surgical  History:  Procedure Laterality Date  . CHOLECYSTECTOMY    . COLONOSCOPY WITH PROPOFOL N/A 05/20/2017   Procedure: COLONOSCOPY WITH PROPOFOL;  Surgeon: Carol Ada, MD;  Location: [place];  Service: Endoscopy;  Laterality: N/A;  . ESOPHAGOGASTRODUODENOSCOPY N/A 05/19/2017   Procedure: ESOPHAGOGASTRODUODENOSCOPY (EGD);  Surgeon: Juanita Craver, MD;  Location: [facility] ENDOSCOPY;  Service: Endoscopy;  Laterality: N/A;  . ESOPHAGOGASTRODUODENOSCOPY (EGD) WITH PROPOFOL N/A 01/17/2019   Procedure: ESOPHAGOGASTRODUODENOSCOPY (EGD) WITH PROPOFOL;  Surgeon: Carol Ada, MD;  Location: [facility]  ENDOSCOPY;  Service: Endoscopy;  Laterality: N/A;  . SAVORY DILATION N/A 01/17/2019   Procedure: SAVORY DILATION;  Surgeon: Carol Ada, MD;  Location: [facility] ENDOSCOPY;  Service: Endoscopy;  Laterality: N/A;     Home Medications:  Prior to Admission medications   Medication Sig Start Date End Date Taking? Authorizing Provider  amitriptyline (ELAVIL) 25 MG tablet Take 25 mg by mouth at bedtime.   Yes [provider]  benzonatate (TESSALON) 100 MG capsule Take 100 mg by mouth 4 (four) times daily as needed for cough.  10/25/19  Yes [provider]  CONSTULOSE 10 GM/15ML solution Take 10 g by mouth daily as needed for mild constipation.  10/25/19  Yes [provider]  escitalopram (LEXAPRO) 5 MG tablet Take 5 mg by mouth daily. 10/03/19  Yes [provider]  folic acid (FOLVITE) 1 MG tablet Take 1 mg by mouth daily.   Yes [provider]  furosemide (LASIX) 20 MG tablet Take 20 mg by mouth daily as needed for edema.  10/25/19  Yes [provider]  gabapentin (NEURONTIN) 400 MG capsule Take 400 mg by mouth 3 (three) times daily. 10/03/19  Yes [provider]  hydroxychloroquine (PLAQUENIL) 200 MG tablet Take 200 mg by mouth daily. 10/04/19  Yes [provider]  JANUVIA 100 MG tablet Take 100 mg by mouth daily. 10/02/19  Yes [provider]  lisinopril (ZESTRIL) 2.5 MG tablet Take 2.5 mg by mouth daily. 10/15/19  Yes [provider]  pantoprazole (PROTONIX) 40 MG tablet Take 1 tablet (40 mg total) by mouth 2 (two) times daily. 01/18/19  Yes Mariel Aloe, MD  potassium chloride (KLOR-CON) 10 MEQ tablet Take 10 mEq by mouth daily. 10/25/19  Yes [provider]  ropinirole (REQUIP) 5 MG tablet Take 5 mg by mouth at bedtime. 10/02/19  Yes [provider]    Inpatient Medications: Scheduled Meds: . escitalopram  5 mg Oral Daily  . furosemide  20 mg Intravenous Q12H  . gabapentin  400 mg Oral TID  . insulin  aspart  0-9 Units Subcutaneous TID WC  . pantoprazole  40 mg Oral BID  . ropinirole  5 mg Oral QHS  . sodium chloride flush  3 mL Intravenous Q12H  . sodium chloride flush  3 mL Intravenous Q12H  . sodium chloride flush  3 mL Intravenous Q12H   Continuous Infusions: . sodium chloride 250 mL (11/06/19 1058)  . azithromycin    . cefTRIAXone (ROCEPHIN)  IV 2 g (11/06/19 1059)   PRN Meds: sodium chloride, acetaminophen **OR** acetaminophen, benzonatate, bisacodyl, HYDROcodone-acetaminophen, ondansetron **OR** ondansetron (ZOFRAN) IV, senna-docusate, sodium chloride flush  Allergies:    Allergies  Allergen Reactions  . Amlodipine Besylate Swelling    swelling    Social History:   Social History   Socioeconomic History  . Marital status: Married    Spouse name: Not on file  . Number of children: Not on file  . Years of education: Not on  file  . Highest education level: Not on file  Occupational History  . Not on file  Tobacco Use  . Smoking status: Never Smoker  . Smokeless tobacco: Never Used  Vaping Use  . Vaping Use: Never used  Substance and Sexual Activity  . Alcohol use: No  . Drug use: No  . Sexual activity: Not Currently  Other Topics Concern  . Not on file  Social History Narrative  . Not on file   Social Determinants of Health   Financial Resource Strain:   . Difficulty of Paying Living Expenses:   Food Insecurity:   . Worried About Charity fundraiser in the Last Year:   . Arboriculturist in the Last Year:   Transportation Needs:   . Film/video editor (Medical):   Marland Kitchen Lack of Transportation (Non-Medical):   Physical Activity:   . Days of Exercise per Week:   . Minutes of Exercise per Session:   Stress:   . Feeling of Stress :   Social Connections:   . Frequency of Communication with Friends and Family:   . Frequency of Social Gatherings with Friends and Family:   . Attends Religious Services:   . Active Member of Clubs or Organizations:   .  Attends Archivist Meetings:   Marland Kitchen Marital Status:   Intimate Partner Violence:   . Fear of Current or Ex-Partner:   . Emotionally Abused:   Marland Kitchen Physically Abused:   . Sexually Abused:     Family History:    Family History  Problem Relation Age of Onset  . Heart attack Sister   . Heart failure Sister   . Heart attack Brother   . Heart failure Brother      ROS:  Please see the history of present illness.  General:no colds or fevers, no weight changes Skin:no rashes or ulcers HEENT:no blurred vision, no congestion CV:see HPI PUL:see HPI GI:no diarrhea constipation or melena, no indigestion GU:no hematuria, no dysuria MS:no joint pain, no claudication Neuro:no syncope, no lightheadedness Endo:+ diabetes, no thyroid disease  All other ROS reviewed and negative.     Physical Exam/Data:   Vitals:   11/06/19 0729 11/06/19 0858 11/06/19 0933 11/06/19 1136  BP: 114/77 128/75  115/73  Pulse: (!) 113 (!) 113  (!) 104  Resp: 18 16  16   Temp: 100.1 F (37.8 C) 100.3 F (37.9 C) 99.9 F (37.7 C) 99.4 F (37.4 C)  TempSrc: Oral Oral Oral Oral  SpO2:  95%  98%  Weight:      Height:        Intake/Output Summary (Last 24 hours) at 11/06/2019 1140 Last data filed at 11/06/2019 1138 Gross per 24 hour  Intake 830.9 ml  Output 2900 ml  Net -2069.1 ml   Last 3 Weights 11/05/2019 11/05/2019 01/15/2019  Weight (lbs) 185 lb 10 oz 175 lb 157 lb  Weight (kg) 84.2 kg 79.379 kg 71.215 kg     Body mass index is 35.07 kg/m.  General:  Well nourished, well developed, in no acute distress, she is pale HEENT: normal Lymph: no adenopathy Neck: no JVD even at 20 degrees  Endocrine:  No thryomegaly Vascular: No carotid bruits; pedal pulses 2+ bilaterally  Cardiac:  normal S1, S2; RRR; no murmur gallup rub or click Lungs:  clear to auscultation bilaterally, no wheezing, rhonchi or rales  Abd: soft, nontender, no hepatomegaly  Ext: no edema at ankles some mid calf Musculoskeletal:   No deformities,  BUE and BLE strength normal and equal Skin: warm and dry  Neuro:  CNs 2-12 intact, no focal abnormalities noted Psych:  Normal affect     Relevant CV Studies: Echo pending  Laboratory Data:  High Sensitivity Troponin:   Recent Labs  Lab 11/05/19 1821 11/05/19 2111 11/06/19 0058 11/06/19 0659  TROPONINIHS 54* 345* 1,226* 1,787*     Chemistry Recent Labs  Lab 11/05/19 1821 11/06/19 0058  NA 132* 134*  K 4.4 4.4  CL 100 100  CO2 21* 24  GLUCOSE 149* 130*  BUN 13 14  CREATININE 1.08* 1.18*  CALCIUM 8.7* 8.6*  GFRNONAA 51* 46*  GFRAA 59* 53*  ANIONGAP 11 10    Recent Labs  Lab 11/05/19 1821  PROT 6.6  ALBUMIN 3.3*  AST 17  ALT 9  ALKPHOS 73  BILITOT 0.5   Hematology Recent Labs  Lab 11/05/19 1821 11/06/19 0058  WBC 10.8* 7.8  RBC 2.98* 2.65*  2.57*  HGB 8.1* 7.1*  HCT 25.8* 22.9*  MCV 86.6 86.4  MCH 27.2 26.8  MCHC 31.4 31.0  RDW 14.0 14.2  PLT 296 228   BNP Recent Labs  Lab 11/05/19 1821  BNP 719.2*    DDimer No results for input(s): DDIMER in the last 168 hours.   Radiology/Studies:  CT Angio Chest PE W/Cm &/Or Wo Cm  Result Date: 11/05/2019 CLINICAL DATA:  Chest pain and shortness of breath. Recent diagnosis of pneumonia. EXAM: CT ANGIOGRAPHY CHEST WITH CONTRAST TECHNIQUE: Multidetector CT imaging of the chest was performed using the standard protocol during bolus administration of intravenous contrast. Multiplanar CT image reconstructions and MIPs were obtained to evaluate the vascular anatomy. CONTRAST:  75mL OMNIPAQUE IOHEXOL 350 MG/ML SOLN COMPARISON:  Radiograph earlier this day.  Chest CT 01/15/2019 FINDINGS: Cardiovascular: There are no filling defects within the pulmonary arteries to suggest pulmonary embolus. Moderate aortic atherosclerosis. Cannot assess for dissection given phase of contrast tailored to pulmonary artery evaluation. Mitral annulus calcifications. Coronary artery calcifications. Heart is upper normal  in size. Mediastinum/Nodes: Prominent subcarinal node measures 14 mm, series 4, image 54. There are shotty bilateral hilar nodes measuring 10 mm on the left and 8 mm on the right. Hypodense 2.3 cm right thyroid nodule. Patulous esophagus with small to moderate hiatal hernia. Lungs/Pleura: Elevated right hemidiaphragm with compressive atelectasis in the right lower and middle lobes. Small bilateral pleural effusions. Patchy ground-glass opacities in the superior segment of the right lower lobe and throughout the right upper lobe suspicious for pneumonia. There is central bronchial thickening with areas of mucous plugging on the left. Questionable mild pulmonary edema with smooth septal thickening. Upper Abdomen: Small to moderate hiatal hernia. Cholecystectomy. No acute or unexpected findings. Musculoskeletal: Diffuse degenerative change in the spine. There are no acute or suspicious osseous abnormalities. Review of the MIP images confirms the above findings. IMPRESSION: 1. No pulmonary embolus. 2. Patchy ground-glass opacities in the superior segment of the right lower lobe and throughout the right upper lobe suspicious for pneumonia. 3. Elevated right hemidiaphragm with compressive atelectasis in the right middle and lower lobes. 4. Small bilateral pleural effusions. Questionable mild pulmonary edema with smooth septal thickening. 5. Central bronchial thickening with areas of mucous plugging in the left lower lobe. 6. Small to moderate hiatal hernia. 7. Right thyroid nodule measuring 2.3 cm. This was evaluated on thyroid imaging 01/15/2019, please reference that diagnostic ultrasound for further recommendations. Aortic Atherosclerosis (ICD10-I70.0). Electronically Signed   By: Keith Rake M.D.   On:  11/05/2019 20:41   DG Chest Port 1 View  Result Date: 11/05/2019 CLINICAL DATA:  Fever, shortness of breath EXAM: PORTABLE CHEST 1 VIEW COMPARISON:  10/02/2017 FINDINGS: Low lung volumes. Chronic elevation of  the right hemidiaphragm with bibasilar atelectasis. Possible small left pleural effusion. No pneumothorax. Atherosclerotic calcification of the aortic knob. IMPRESSION: Low lung volumes with bibasilar atelectasis. Possible small left pleural effusion. Electronically Signed   By: Davina Poke D.O.   On: 11/05/2019 18:45        NO CHEST PAIN this admit  Assessment and Plan:   1. PNA followed by IM and treatment as outpt but symptoms increased, now with fever on zithromax IV and rocephin per IM 2. Acute HF with edema and SOB with elevated BNP, small pl effusions. On IV lasix  Has rec'd a total of 40 mg , 20 last evening and 20 this AM she is neg 1369 cc no wt to compare-  Echo pending may be from PNA vs new CM - and with elevated troponin unsure if demand ischemia with acute illness and anemia vs MI though with exertion last week  3. Elevated troponin pk this AM 1787  EKG without acute changes.  May be demand ischemia but she has several risk factors for CAD with HTN, DM, HLD also FH of CAD beginning at age 9.   Would treat anemia and plan for ischemic work up when fever improved.Dr Oval Linsey to see for complete plan.  No chest pain currently  4. Acute anemia with Hgb 7.1 and stool neg for blood with cardiac issues would want Hgb to 8 per  IM 5. DM -2 per IM 6. HTN controlled was on lisinopril 2.5 mg at home  Her lasix was prn. Now on lasix and lisinopril on hold.  7. HLD  Will add lipitor       For questions or updates, please contact [place] Please consult [email or web address] for contact info under    Signed, Cecilie Kicks, NP  11/06/2019 11:40 AM

## 2019-11-06 NOTE — Progress Notes (Signed)
  Echocardiogram 2D Echocardiogram has been performed.  Aneudy Champlain G Susano Cleckler 11/06/2019, 4:20 PM

## 2019-11-07 ENCOUNTER — Inpatient Hospital Stay (HOSPITAL_COMMUNITY): Payer: Medicare HMO

## 2019-11-07 LAB — GLUCOSE, CAPILLARY
Glucose-Capillary: 79 mg/dL (ref 70–99)
Glucose-Capillary: 116 mg/dL — ABNORMAL HIGH (ref 70–99)
Glucose-Capillary: 114 mg/dL — ABNORMAL HIGH (ref 70–99)
Glucose-Capillary: 113 mg/dL — ABNORMAL HIGH (ref 70–99)

## 2019-11-07 LAB — COMPREHENSIVE METABOLIC PANEL
ALT: 11 U/L (ref 0–44)
AST: 22 U/L (ref 15–41)
Albumin: 2.8 g/dL — ABNORMAL LOW (ref 3.5–5.0)
Alkaline Phosphatase: 62 U/L (ref 38–126)
Anion gap: 8 (ref 5–15)
BUN: 17 mg/dL (ref 8–23)
CO2: 24 mmol/L (ref 22–32)
Calcium: 8.2 mg/dL — ABNORMAL LOW (ref 8.9–10.3)
Chloride: 102 mmol/L (ref 98–111)
Creatinine, Ser: 1.34 mg/dL — ABNORMAL HIGH (ref 0.44–1.00)
GFR calc Af Amer: 46 mL/min — ABNORMAL LOW (ref >60)
GFR calc non Af Amer: 39 mL/min — ABNORMAL LOW (ref >60)
Glucose, Bld: 97 mg/dL (ref 70–99)
Potassium: 4 mmol/L (ref 3.5–5.1)
Sodium: 134 mmol/L — ABNORMAL LOW (ref 135–145)
Total Bilirubin: 0.6 mg/dL (ref 0.3–1.2)
Total Protein: 5.9 g/dL — ABNORMAL LOW (ref 6.5–8.1)

## 2019-11-07 LAB — CBC WITH DIFFERENTIAL/PLATELET
Abs Immature Granulocytes: 0.02 10*3/uL (ref 0.00–0.07)
Basophils Absolute: 0 10*3/uL (ref 0.0–0.1)
Basophils Relative: 1 %
Eosinophils Absolute: 0.2 10*3/uL (ref 0.0–0.5)
Eosinophils Relative: 3 %
HCT: 22.1 % — ABNORMAL LOW (ref 36.0–46.0)
Hemoglobin: 6.7 g/dL — CL (ref 12.0–15.0)
Immature Granulocytes: 0 %
Lymphocytes Relative: 27 %
Lymphs Abs: 1.7 10*3/uL (ref 0.7–4.0)
MCH: 26.1 pg (ref 26.0–34.0)
MCHC: 30.3 g/dL (ref 30.0–36.0)
MCV: 86 fL (ref 80.0–100.0)
Monocytes Absolute: 0.5 10*3/uL (ref 0.1–1.0)
Monocytes Relative: 8 %
Neutro Abs: 3.9 10*3/uL (ref 1.7–7.7)
Neutrophils Relative %: 61 %
Platelets: 211 10*3/uL (ref 150–400)
RBC: 2.57 MIL/uL — ABNORMAL LOW (ref 3.87–5.11)
RDW: 14.3 % (ref 11.5–15.5)
WBC: 6.3 10*3/uL (ref 4.0–10.5)
nRBC: 0 % (ref 0.0–0.2)

## 2019-11-07 LAB — PREPARE RBC (CROSSMATCH)

## 2019-11-07 LAB — HEMOGLOBIN A1C
Hgb A1c MFr Bld: 5.6 % (ref 4.8–5.6)
Mean Plasma Glucose: 114 mg/dL

## 2019-11-07 LAB — MAGNESIUM: Magnesium: 2 mg/dL (ref 1.7–2.4)

## 2019-11-07 LAB — URINE CULTURE: Culture: NO GROWTH

## 2019-11-07 LAB — PHOSPHORUS: Phosphorus: 3.6 mg/dL (ref 2.5–4.6)

## 2019-11-07 MED ORDER — AZITHROMYCIN 250 MG PO TABS
500.0000 mg | ORAL_TABLET | Freq: Every day | ORAL | Status: DC
  Administered 2019-11-07 – 2019-11-08 (×2): 500 mg via ORAL
  Filled 2019-11-07 (×2): qty 2

## 2019-11-07 MED ORDER — SODIUM CHLORIDE 0.9% IV SOLUTION: Freq: Once | INTRAVENOUS | Status: AC

## 2019-11-07 NOTE — Progress Notes (Signed)
PROGRESS NOTE    Chloe Harding  CWC:[id number] DOB: [birth year] DOA: 11/05/2019 PCP: [name], NP  Brief Narrative:  HPI per Dr. Mitzi Hansen om 11/05/19 Chloe Harding is a 74 y.o. female with medical history significant for hypertension, type 2 diabetes mellitus, and arthritis, now presenting to the emergency department with shortness of breath and cough.  Patient reports that she developed shortness of breath and cough, as well as bilateral lower extremity swelling approximately 2 weeks ago, was noted to have more swelling on the left than right and reports undergoing venous Dopplers of the lower extremities that were negative for DVT.  She was also started on Lasix 20 mg daily in the past couple weeks as well as amoxicillin for suspected pneumonia.  Despite this, patient reports continued worsening in her dyspnea and cough.  She denies any chest pain, melena, hematochezia, or abdominal pain.  ED Course: Upon arrival to the ED, patient is found to be febrile to 38.3 C, saturating upper 80s on room air, tachypneic, tachycardic to 130, and with blood pressure 107/58.  EKG features sinus tachycardia with rate 123.  CTA chest is negative for PE but concerning for pneumonia in the right upper and right lower lobes, small bilateral pleural effusions, and suspected mild pulmonary edema.  Chemistry panel notable for sodium 132 and creatinine 1.08, from 0.63 a year ago.  CBC notable for mild leukocytosis and normocytic anemia with hemoglobin 8.1, down from 10.9 last August.  BNP is elevated 719, troponin is 54, and lactic acid is reassuringly normal.  Fecal occult blood testing was negative and Covid PCR is negative.  Blood cultures were collected and the patient was treated with Rocephin, azithromycin, and Lasix in the ED.  **Interim History Cardiology was consulted given her elevated troponins.  Echocardiogram was done and showed an EF of 45 to 50% and grade 1 diastolic dysfunction.  Patient also had  an elevated left atrial pressure and the right ventricular systolic function was normal with mildly elevated pulmonary artery systolic pressure.  Today her blood count dropped to 6.7 so she will be typed and screened and transfused 1 unit of PRBCs.  Repeat chest x-ray today showed "Hypoinflation of the lungs with mild bibasilar subsegmental atelectasis. Small pleural effusions cannot be excluded."  Cardiology changed her back to p.o. Lasix yesterday and she is getting 20 mg p.o. daily but may need to titrate further.  Cardiology recommends holding on ischemic evaluation for now. Will obtain SLP evaluation  Assessment & Plan:   Principal Problem:   Pneumonia Active Problems:   Type 2 diabetes mellitus (HCC)   ANEMIA   Mild renal insufficiency   Hypertension   Pure hypercholesterolemia   Hyponatremia   Acute CHF (congestive heart failure) (HCC)   SOB (shortness of breath)   Demand ischemia (HCC)   CAD in native artery  Community Acquired Pneumonia  -Presents with worsening cough and SOB despite completing amoxicillin last week for suspected PNA  -She is found to be febrile, tachycardic, tachypneic, and saturating upper 80s-low 90s at rest with mild leukocytosis and CT findings concerning for right-sided PNA  -Remains Tachycardic  -Check SLP to ensure that she is not aspirating and cystoscopy pending -Blood cultures were collected in ED and she was started on Rocephin and azithromycin  and will continue - Culture sputum, check strep pneumo and legionella antigens,  -We will add albuterol 2.5 mg nebs every 4 hours as needed -Continue with benzonatate 100 g p.o. 4 times a  day as needed for cough -Add Guaifenesin 1200 mg po BID, Flutter Valve and Incentive Spirometry  -Repeat CXR this AM showed "Hypoinflation of the lungs with mild bibasilar subsegmental atelectasis. Small pleural effusions cannot be excluded."  Acute Combined Systolic and Grade 1 Diastolic CHF -Patient reports recent  bilateral LE edema Lt > Rt and states that she had negative venous dopplers last week and was started on Lasix 20 mg qD but has never had an echo  -She has peripheral edema on admission, BNP is elevated to 719, and CTA chest features small pleural effusions and suspected mild edema  -She was given Lasix 20 mg IV in ED  -Continued diuresis with Lasix 20 mg IV q12 but has been changed to po Lasix 20 by Cardiology yesterday,  -ECHO showed an EF of 45 to 50% and grade 1 diastolic dysfunction.  Patient also had an elevated left atrial pressure and the right ventricular systolic function was normal with mildly elevated pulmonary artery systolic pressure -follow daily wt and I/Os, check echocardiogram, monitor electrolytes and renal function during diuresis  -She is -2.370 L since admission -Continue to monitor for signs or symptoms of volume overload -She will need an ambulatory home O2 screen prior to discharge -Repeat chest x-ray in a.m.  Normocytic Anemia  -Hgb is 8.1 on admission, down from 10.9 in August 2020  -She denies melena, hematochezia, or any other bleeding and FOBT is negative in ED  -Type and screen was done in ED  -Anemia panel was checked and showed an iron level of 20, U IBC of 291, TIBC of 311, saturation ratio of 6%, ferritin level of 18, folate level 53.5, and vitamin B12 level of 583 -Hemoglobin/hematocrit is dropping and went from 8.1/25.8 on admission down to 7.1/22.9 yesterday and today it was 6.7/22.1 -We will transfuse 1 unit of PRBCs  -Continue to monitor for signs and symptoms of bleeding; currently no overt bleeding noted -Repeat CBC in the AM -May need to discuss with Gastroenterology  Mild renal insufficiency/AKI, worsening  Metabolic acidosis, improved  -SCr is 1.08 in ED, up from 0.63 in Sept 2020  - She is hypervolemic and being diuresed  -Patient's CO2 on admission was 21, anion gap is 11, chloride level is 100, now improved with a CO2 of 24, chloride level  102, and anion gap of 8 -Patient BUN/creatinine slightly worsened has gone from 13/1.08 -> 14/1.18 -> 17/1.34 -Avoid nephrotoxic medications, hypotension, contrast dyes and renally dose medications -Continued with diuresis with IV Lasix 20 mg but has been changed to po 20 mg by Cardiology -Continue to monitor Renal Fxn and trend CMP   Hypertension  -BP on low side in ED -Improved and is now 143/80 -Continue to Monitor BP's per Protocol -BP is now 143/80  Type II DM  - A1c was only 5.2% a year ago  - Pharmacy medication reconciliation was pending but she takes Januvia 100 mg p.o. daily which we will hold  -Check CBGs and she was started on a sensitive NovoLog sign scale insulin AC -CBGs have been ranging from 79-130 -May need to repeat her hemoglobin A1c  Hyponatremia -Serum sodium is 132 in setting of hypervolemia and pneumonia on admission -Continue to Monitor while diuresing  -Sodium is now improved and is 134 again today  -Repeat CMP in a.m.  Elevated Troponin, worsening   -HS troponin is 54 in ED without chest pain or acute ischemic features on EKG  -Repeat troponins are trending up and repeat was  1226 and following that the next repeat was 1787 -Continue cardiac monitoring,  -follow-up echo, treat underlying PNA and CHF as above  -Denies any chest pain it could be demand ischemia but will have cardiology evaluate for further evaluation recommendations and further work-up  Obesity -Estimated body mass index is 35.07 kg/m as calculated from the following:   Height as of this encounter: 5\' 1"  (1.549 m).   Weight as of this encounter: 84.2 kg. -Continued Weight Loss and Dietary Counseling  GERD -C/w PPI with Pantoprazole 40 mg po BID   Depression -C/w Escitalopram 5 mg po Daily  HLD -C/w Atorvastatin 20 mg po Daily   DVT prophylaxis: SCDs Code Status: FULL CODE  Family Communication: Discussed with family present at bedside  Disposition Plan: Pending further  clinical improvement of her respiratory status and evaluation by cardiology for further work-up  Status is: Inpatient  Remains inpatient appropriate because:Ongoing diagnostic testing needed not appropriate for outpatient work up, Unsafe d/c plan, IV treatments appropriate due to intensity of illness or inability to take PO and Inpatient level of care appropriate due to severity of illness   Dispo: The patient is from: Home              Anticipated d/c is to: Home              Anticipated d/c date is: 2 days              Patient currently is not medically stable to d/c.  Consultants:   Cardiology    Procedures:  ECHOCARDIOGRAM 11/06/19 IMPRESSIONS    1. Mild global reduction in LV systolic function; grade 1 diastolic  dysfunction; mild MR; mild LAE.  2. Left ventricular ejection fraction, by estimation, is 45 to 50%. The  left ventricle has mildly decreased function. The left ventricle  demonstrates global hypokinesis. Left ventricular diastolic parameters are  consistent with Grade I diastolic  dysfunction (impaired relaxation). Elevated left atrial pressure.  3. Right ventricular systolic function is normal. The right ventricular  size is normal. There is mildly elevated pulmonary artery systolic  pressure.  4. Left atrial size was mildly dilated.  5. The mitral valve is normal in structure. Mild mitral valve  regurgitation. No evidence of mitral stenosis.  6. The aortic valve is tricuspid. Aortic valve regurgitation is not  visualized. Mild to moderate aortic valve sclerosis/calcification is  present, without any evidence of aortic stenosis.  7. The inferior vena cava is normal in size with greater than 50%  respiratory variability, suggesting right atrial pressure of 3 mmHg.   FINDINGS  Left Ventricle: Left ventricular ejection fraction, by estimation, is 45  to 50%. The left ventricle has mildly decreased function. The left  ventricle demonstrates global  hypokinesis. The left ventricular internal  cavity size was normal in size. There is  no left ventricular hypertrophy. Left ventricular diastolic parameters  are consistent with Grade I diastolic dysfunction (impaired relaxation).  Elevated left atrial pressure.   Right Ventricle: The right ventricular size is normal. Right ventricular  systolic function is normal. There is mildly elevated pulmonary artery  systolic pressure. The tricuspid regurgitant velocity is 2.90 m/s, and  with an assumed right atrial pressure  of 3 mmHg, the estimated right ventricular systolic pressure is 96.7 mmHg.   Left Atrium: Left atrial size was mildly dilated.   Right Atrium: Right atrial size was normal in size.   Pericardium: There is no evidence of pericardial effusion.   Mitral Valve:  The mitral valve is normal in structure. There is moderate  calcification of the mitral valve leaflet(s). Normal mobility of the  mitral valve leaflets. Mild mitral valve regurgitation. No evidence of  mitral valve stenosis.   Tricuspid Valve: The tricuspid valve is normal in structure. Tricuspid  valve regurgitation is mild . No evidence of tricuspid stenosis.   Aortic Valve: The aortic valve is tricuspid. Aortic valve regurgitation is  not visualized. Mild to moderate aortic valve sclerosis/calcification is  present, without any evidence of aortic stenosis.   Pulmonic Valve: The pulmonic valve was normal in structure. Pulmonic valve  regurgitation is trivial. No evidence of pulmonic stenosis.   Aorta: The aortic root is normal in size and structure.   Venous: The inferior vena cava is normal in size with greater than 50%  respiratory variability, suggesting right atrial pressure of 3 mmHg.   IAS/Shunts: No atrial level shunt detected by color flow Doppler.   Additional Comments: Mild global reduction in LV systolic function; grade  1 diastolic dysfunction; mild MR; mild LAE.     LEFT VENTRICLE  PLAX 2D    LVIDd:     4.30 cm Diastology  LVIDs:     2.80 cm LV e' lateral:  5.61 cm/s  LV PW:     1.30 cm LV E/e' lateral: 25.8  LV IVS:    1.00 cm LV e' medial:  5.52 cm/s  LVOT diam:   1.90 cm LV E/e' medial: 26.3  LV SV:     56  LV SV Index:  31  LVOT Area:   2.84 cm     RIGHT VENTRICLE      IVC  RV Basal diam: 2.80 cm  IVC diam: 2.00 cm  RV S prime:   9.46 cm/s  TAPSE (M-mode): 1.6 cm   LEFT ATRIUM       Index    RIGHT ATRIUM     Index  LA diam:    4.80 cm 2.62 cm/m RA Area:   9.12 cm  LA Vol (A2C):  71.1 ml 38.86 ml/m RA Volume:  19.60 ml 10.71 ml/m  LA Vol (A4C):  47.5 ml 25.96 ml/m  LA Biplane Vol: 58.5 ml 31.97 ml/m  AORTIC VALVE  LVOT Vmax:  105.00 cm/s  LVOT Vmean: 71.300 cm/s  LVOT VTI:  0.198 m    AORTA  Ao Root diam: 2.90 cm  Ao Asc diam: 3.10 cm   MITRAL VALVE        TRICUSPID VALVE  MV Area (PHT): 2.99 cm   TR Peak grad:  33.6 mmHg  MV Decel Time: 254 msec   TR Vmax:    290.00 cm/s  MV E velocity: 145.00 cm/s  MV A velocity: 136.00 cm/s SHUNTS  MV E/A ratio: 1.07     Systemic VTI: 0.20 m               Systemic Diam: 1.90 cm    Antimicrobials:  Anti-infectives (From admission, onward)   Start     Dose/Rate Route Frequency Ordered Stop   11/07/19 1100  azithromycin (ZITHROMAX) tablet 500 mg     Discontinue     500 mg Oral Daily 11/07/19 1030 11/10/19 0959   11/06/19 1800  azithromycin (ZITHROMAX) 500 mg in sodium chloride 0.9 % 250 mL IVPB  Status:  Discontinued        500 mg 250 mL/hr over 60 Minutes Intravenous Every 24 hours 11/06/19 0000 11/07/19 1030   11/06/19 1000  cefTRIAXone (ROCEPHIN)  2 g in sodium chloride 0.9 % 100 mL IVPB     Discontinue     2 g 200 mL/hr over 30 Minutes Intravenous Every 24 hours 11/06/19 0000 11/10/19 0959   11/05/19 1830  cefTRIAXone (ROCEPHIN) 1 g in sodium chloride 0.9 % 100 mL IVPB        1 g 200 mL/hr  over 30 Minutes Intravenous  Once 11/05/19 1829 11/05/19 1915   11/05/19 1830  azithromycin (ZITHROMAX) 500 mg in sodium chloride 0.9 % 250 mL IVPB        500 mg 250 mL/hr over 60 Minutes Intravenous  Once 11/05/19 1829 11/05/19 2051     Subjective: Seen and examined at bedside and states her SOB is improved. No CP. Thinks leg swelling is improved. No lightheadedness or dizziness. Feels well and wanting to go home. No other concerns or complaints at this time.   Objective: Vitals:   11/07/19 0949 11/07/19 1210 11/07/19 1313 11/07/19 1315  BP: 127/68 (!) 150/86 (!) 149/83 (!) 143/80  Pulse: (!) 115 (!) 116 (!) 116 (!) 117  Resp: 18 20 19 19   Temp: 98.4 F (36.9 C) 98.8 F (37.1 C) 99.2 F (37.3 C) 99.4 F (37.4 C)  TempSrc: Oral Oral Oral Oral  SpO2: 96% 99% 95% 94%  Weight:      Height:        Intake/Output Summary (Last 24 hours) at 11/07/2019 1357 Last data filed at 11/07/2019 0915 Gross per 24 hour  Intake 719.93 ml  Output 1500 ml  Net -780.07 ml   Filed Weights   11/05/19 1915 11/05/19 2332 11/07/19 0500  Weight: 79.4 kg 84.2 kg 84.2 kg   Examination: Physical Exam:  Constitutional: WN/WD obese Caucasian female in NAD and appears calm and comfortable Eyes: Lids and conjunctivae normal, sclerae anicteric  ENMT: External Ears, Nose appear normal. Grossly normal hearing.  Neck: Appears normal, supple, no cervical masses, normal ROM, no appreciable thyromegaly; no JVD Respiratory: Diminished to auscultation bilaterally with coarse breath sounds, no wheezing, rales, rhonchi or crackles. Normal respiratory effort and patient is not tachypenic. No accessory muscle use; Unlabored breathing and not using any supplemental O2 via [place]  Cardiovascular: RRR, no murmurs / rubs / gallops. S1 and S2 auscultated. Left Leg is bigger than Right Leg. No carotid bruits.  Abdomen: Soft, non-tender, Distended 2/2 body habitus. Bowel sounds positive.  GU: Deferred. Musculoskeletal: No  clubbing / cyanosis of digits/nails. No joint deformity upper and lower extremities.  Skin: No rashes, lesions, ulcers. No induration; Warm and dry.  Neurologic: CN 2-12 grossly intact with no focal deficits. Romberg sign and cerebellar reflexes not assessed.  Psychiatric: Normal judgment and insight. Alert and oriented x 3. Normal mood and appropriate affect.   Data Reviewed: I have personally reviewed following labs and imaging studies  CBC: Recent Labs  Lab 11/05/19 1821 11/06/19 0058 11/07/19 0328  WBC 10.8* 7.8 6.3  NEUTROABS 9.2*  --  3.9  HGB 8.1* 7.1* 6.7*  HCT 25.8* 22.9* 22.1*  MCV 86.6 86.4 86.0  PLT 296 228 427   Basic Metabolic Panel: Recent Labs  Lab 11/05/19 1821 11/06/19 0058 11/07/19 0328  NA 132* 134* 134*  K 4.4 4.4 4.0  CL 100 100 102  CO2 21* 24 24  GLUCOSE 149* 130* 97  BUN 13 14 17   CREATININE 1.08* 1.18* 1.34*  CALCIUM 8.7* 8.6* 8.2*  MG  --  1.9 2.0  PHOS  --   --  3.6  GFR: Estimated Creatinine Clearance: 37.4 mL/min (A) (by C-G formula based on SCr of 1.34 mg/dL (H)). Liver Function Tests: Recent Labs  Lab 11/05/19 1821 11/07/19 0328  AST 17 22  ALT 9 11  ALKPHOS 73 62  BILITOT 0.5 0.6  PROT 6.6 5.9*  ALBUMIN 3.3* 2.8*   No results for input(s): LIPASE, AMYLASE in the last 168 hours. No results for input(s): AMMONIA in the last 168 hours. Coagulation Profile: Recent Labs  Lab 11/05/19 1821  INR 1.1   Cardiac Enzymes: No results for input(s): CKTOTAL, CKMB, CKMBINDEX, TROPONINI in the last 168 hours. BNP (last 3 results) No results for input(s): PROBNP in the last 8760 hours. HbA1C: Recent Labs    11/06/19 0058  HGBA1C 5.6   CBG: Recent Labs  Lab 11/06/19 1155 11/06/19 1647 11/06/19 1947 11/07/19 0755 11/07/19 1142  GLUCAP 100* 92 130* 79 116*   Lipid Profile: No results for input(s): CHOL, HDL, LDLCALC, TRIG, CHOLHDL, LDLDIRECT in the last 72 hours. Thyroid Function Tests: No results for input(s): TSH,  T4TOTAL, FREET4, T3FREE, THYROIDAB in the last 72 hours. Anemia Panel: Recent Labs    11/06/19 0058  VITAMINB12 583  FOLATE 53.5  FERRITIN 18  TIBC 311  IRON 20*  RETICCTPCT 2.0   Sepsis Labs: Recent Labs  Lab 11/05/19 1821 11/05/19 2111  LATICACIDVEN 1.3 1.1    Recent Results (from the past 240 hour(s))  Blood Culture (routine x 2)     Status: None (Preliminary result)   Collection Time: 11/05/19  6:21 PM   Specimen: BLOOD  Result Value Ref Range Status   Specimen Description   Final    BLOOD RIGHT ANTECUBITAL Performed at [facility] [address], [place], [place] [address]    Special Requests   Final    BOTTLES DRAWN AEROBIC AND ANAEROBIC Blood Culture adequate volume Performed at [facility] [address], [place], [place] [address]    Culture   Final    NO GROWTH 2 DAYS Performed at [facility] [address] [place], [place] [address]    Report Status PENDING  Incomplete  SARS Coronavirus 2 by RT PCR (hospital order, performed in [facility] hospital lab) Nasopharyngeal Nasopharyngeal Swab     Status: None   Collection Time: 11/05/19  6:21 PM   Specimen: Nasopharyngeal Swab  Result Value Ref Range Status   SARS Coronavirus 2 NEGATIVE NEGATIVE Final    Comment: (NOTE) SARS-CoV-2 target nucleic acids are NOT DETECTED.  The SARS-CoV-2 RNA is generally detectable in upper and lower respiratory specimens during the acute phase of infection. The lowest concentration of SARS-CoV-2 viral copies this assay can detect is 250 copies / mL. A negative result does not preclude SARS-CoV-2 infection and should not be used as the sole basis for treatment or other patient management decisions.  A negative result may occur with improper specimen collection / handling, submission of specimen other than nasopharyngeal swab, presence of viral mutation(s) within the areas targeted by this assay, and inadequate number of viral  copies (<250 copies / mL). A negative result must be combined with clinical observations, patient history, and epidemiological information.  Fact Sheet for Patients:   StrictlyIdeas.no  Fact Sheet for Healthcare Providers: BankingDealers.co.za  This test is not yet approved or  cleared by the [place] FDA and has been authorized for detection and/or diagnosis of SARS-CoV-2 by FDA under an Emergency Use Authorization (EUA).  This EUA will remain in effect (  meaning this test can be used) for the duration of the COVID-19 declaration under Section 564(b)(1) of the Act, 21 U.S.C. section 360bbb-3(b)(1), unless the authorization is terminated or revoked sooner.  Performed at [facility], [place] [address] [place], [place] [address]   Blood Culture (routine x 2)     Status: None (Preliminary result)   Collection Time: 11/05/19  6:41 PM   Specimen: BLOOD LEFT FOREARM  Result Value Ref Range Status   Specimen Description   Final    BLOOD LEFT FOREARM Performed at [facility] [address] [place], [place] [address]    Special Requests   Final    BOTTLES DRAWN AEROBIC ONLY Blood Culture results may not be optimal due to an inadequate volume of blood received in culture bottles Performed at [facility] [address] [place], [place] [address]    Culture   Final    NO GROWTH 2 DAYS Performed at [facility] [address] [place], [place] [address]    Report Status PENDING  Incomplete  Urine culture     Status: None   Collection Time: 11/06/19  1:00 AM   Specimen: In/Out Cath Urine  Result Value Ref Range Status   Specimen Description   Final    IN/OUT CATH URINE Performed at [facility] [address] [place], [place] [address]    Special Requests   Final    NONE Performed at [facility], [place] [address]  [place], [place] [address]    Culture   Final    NO GROWTH Performed at [facility], [place] [address], [place],  [address]    Report Status 11/07/2019 FINAL  Final     RN Pressure Injury Documentation:     Estimated body mass index is 35.07 kg/m as calculated from the following:   Height as of this encounter: 5\' 1"  (1.549 m).   Weight as of this encounter: 84.2 kg.  Malnutrition Type:      Malnutrition Characteristics:      Nutrition Interventions:    Radiology Studies: CT Angio Chest PE W/Cm &/Or Wo Cm  Result Date: 11/05/2019 CLINICAL DATA:  Chest pain and shortness of breath. Recent diagnosis of pneumonia. EXAM: CT ANGIOGRAPHY CHEST WITH CONTRAST TECHNIQUE: Multidetector CT imaging of the chest was performed using the standard protocol during bolus administration of intravenous contrast. Multiplanar CT image reconstructions and MIPs were obtained to evaluate the vascular anatomy. CONTRAST:  80mL OMNIPAQUE IOHEXOL 350 MG/ML SOLN COMPARISON:  Radiograph earlier this day.  Chest CT 01/15/2019 FINDINGS: Cardiovascular: There are no filling defects within the pulmonary arteries to suggest pulmonary embolus. Moderate aortic atherosclerosis. Cannot assess for dissection given phase of contrast tailored to pulmonary artery evaluation. Mitral annulus calcifications. Coronary artery calcifications. Heart is upper normal in size. Mediastinum/Nodes: Prominent subcarinal node measures 14 mm, series 4, image 54. There are shotty bilateral hilar nodes measuring 10 mm on the left and 8 mm on the right. Hypodense 2.3 cm right thyroid nodule. Patulous esophagus with small to moderate hiatal hernia. Lungs/Pleura: Elevated right hemidiaphragm with compressive atelectasis in the right lower and middle lobes. Small bilateral pleural effusions. Patchy ground-glass opacities in the superior segment of the right lower lobe and throughout the right upper lobe suspicious for pneumonia. There is  central bronchial thickening with areas of mucous plugging on the left. Questionable mild pulmonary edema with smooth septal thickening. Upper Abdomen: Small to  moderate hiatal hernia. Cholecystectomy. No acute or unexpected findings. Musculoskeletal: Diffuse degenerative change in the spine. There are no acute or suspicious osseous abnormalities. Review of the MIP images confirms the above findings. IMPRESSION: 1. No pulmonary embolus. 2. Patchy ground-glass opacities in the superior segment of the right lower lobe and throughout the right upper lobe suspicious for pneumonia. 3. Elevated right hemidiaphragm with compressive atelectasis in the right middle and lower lobes. 4. Small bilateral pleural effusions. Questionable mild pulmonary edema with smooth septal thickening. 5. Central bronchial thickening with areas of mucous plugging in the left lower lobe. 6. Small to moderate hiatal hernia. 7. Right thyroid nodule measuring 2.3 cm. This was evaluated on thyroid imaging 01/15/2019, please reference that diagnostic ultrasound for further recommendations. Aortic Atherosclerosis (ICD10-I70.0). Electronically Signed   By: Keith Rake M.D.   On: 11/05/2019 20:41   DG CHEST PORT 1 VIEW  Result Date: 11/06/2019 CLINICAL DATA:  Shortness of breath. EXAM: PORTABLE CHEST 1 VIEW COMPARISON:  November 05, 2019. FINDINGS: Stable cardiomediastinal silhouette. Atherosclerosis of thoracic aorta is noted. Hypoinflation of the lungs is noted with mild bibasilar subsegmental atelectasis. No pneumothorax is noted. Small pleural effusions cannot be excluded. Bony thorax is unremarkable. IMPRESSION: Hypoinflation of the lungs with mild bibasilar subsegmental atelectasis. Small pleural effusions cannot be excluded. Electronically Signed   By: Marijo Conception M.D.   On: 11/06/2019 14:31   DG Chest Port 1 View  Result Date: 11/05/2019 CLINICAL DATA:  Fever, shortness of breath EXAM: PORTABLE CHEST 1 VIEW COMPARISON:  10/02/2017  FINDINGS: Low lung volumes. Chronic elevation of the right hemidiaphragm with bibasilar atelectasis. Possible small left pleural effusion. No pneumothorax. Atherosclerotic calcification of the aortic knob. IMPRESSION: Low lung volumes with bibasilar atelectasis. Possible small left pleural effusion. Electronically Signed   By: Davina Poke D.O.   On: 11/05/2019 18:45   ECHOCARDIOGRAM COMPLETE  Result Date: 11/06/2019    ECHOCARDIOGRAM REPORT   Patient Name:   SHANAI LARTIGUE Date of Exam: 11/06/2019 Medical Rec #:  [id number]      Height:       61.0 in Accession #:    [id number]     Weight:       185.6 lb Date of Birth:  [birth year]     BSA:          1.830 m Patient Age:    30 years       BP:           114/77 mmHg Patient Gender: F              HR:           106 bpm. Exam Location:  Inpatient Procedure: 2D Echo, Cardiac Doppler and Color Doppler Indications:    I50.33 Acute on chronic diastolic (congestive) heart failure  History:        Patient has no prior history of Echocardiogram examinations.                 Risk Factors:Hypertension, Diabetes and Dyslipidemia. Anemia.  Sonographer:    Jonelle Sidle Dance Referring Phys: [id number] Reynolds  1. Mild global reduction in LV systolic function; grade 1 diastolic dysfunction; mild MR; mild LAE.  2. Left ventricular ejection fraction, by estimation, is 45 to 50%. The left ventricle has mildly decreased function. The left ventricle demonstrates global hypokinesis. Left ventricular diastolic parameters are consistent with Grade I diastolic dysfunction (impaired relaxation). Elevated left atrial pressure.  3. Right ventricular systolic function  is normal. The right ventricular size is normal. There is mildly elevated pulmonary artery systolic pressure.  4. Left atrial size was mildly dilated.  5. The mitral valve is normal in structure. Mild mitral valve regurgitation. No evidence of mitral stenosis.  6. The aortic valve is tricuspid. Aortic valve  regurgitation is not visualized. Mild to moderate aortic valve sclerosis/calcification is present, without any evidence of aortic stenosis.  7. The inferior vena cava is normal in size with greater than 50% respiratory variability, suggesting right atrial pressure of 3 mmHg. FINDINGS  Left Ventricle: Left ventricular ejection fraction, by estimation, is 45 to 50%. The left ventricle has mildly decreased function. The left ventricle demonstrates global hypokinesis. The left ventricular internal cavity size was normal in size. There is  no left ventricular hypertrophy. Left ventricular diastolic parameters are consistent with Grade I diastolic dysfunction (impaired relaxation). Elevated left atrial pressure. Right Ventricle: The right ventricular size is normal. Right ventricular systolic function is normal. There is mildly elevated pulmonary artery systolic pressure. The tricuspid regurgitant velocity is 2.90 m/s, and with an assumed right atrial pressure of 3 mmHg, the estimated right ventricular systolic pressure is 67.6 mmHg. Left Atrium: Left atrial size was mildly dilated. Right Atrium: Right atrial size was normal in size. Pericardium: There is no evidence of pericardial effusion. Mitral Valve: The mitral valve is normal in structure. There is moderate calcification of the mitral valve leaflet(s). Normal mobility of the mitral valve leaflets. Mild mitral valve regurgitation. No evidence of mitral valve stenosis. Tricuspid Valve: The tricuspid valve is normal in structure. Tricuspid valve regurgitation is mild . No evidence of tricuspid stenosis. Aortic Valve: The aortic valve is tricuspid. Aortic valve regurgitation is not visualized. Mild to moderate aortic valve sclerosis/calcification is present, without any evidence of aortic stenosis. Pulmonic Valve: The pulmonic valve was normal in structure. Pulmonic valve regurgitation is trivial. No evidence of pulmonic stenosis. Aorta: The aortic root is normal in  size and structure. Venous: The inferior vena cava is normal in size with greater than 50% respiratory variability, suggesting right atrial pressure of 3 mmHg. IAS/Shunts: No atrial level shunt detected by color flow Doppler. Additional Comments: Mild global reduction in LV systolic function; grade 1 diastolic dysfunction; mild MR; mild LAE.  LEFT VENTRICLE PLAX 2D LVIDd:         4.30 cm  Diastology LVIDs:         2.80 cm  LV e' lateral:   5.61 cm/s LV PW:         1.30 cm  LV E/e' lateral: 25.8 LV IVS:        1.00 cm  LV e' medial:    5.52 cm/s LVOT diam:     1.90 cm  LV E/e' medial:  26.3 LV SV:         56 LV SV Index:   31 LVOT Area:     2.84 cm  RIGHT VENTRICLE            IVC RV Basal diam:  2.80 cm    IVC diam: 2.00 cm RV S prime:     9.46 cm/s TAPSE (M-mode): 1.6 cm LEFT ATRIUM             Index       RIGHT ATRIUM          Index LA diam:        4.80 cm 2.62 cm/m  RA Area:     9.12 cm LA Vol (A2C):  71.1 ml 38.86 ml/m RA Volume:   19.60 ml 10.71 ml/m LA Vol (A4C):   47.5 ml 25.96 ml/m LA Biplane Vol: 58.5 ml 31.97 ml/m  AORTIC VALVE LVOT Vmax:   105.00 cm/s LVOT Vmean:  71.300 cm/s LVOT VTI:    0.198 m  AORTA Ao Root diam: 2.90 cm Ao Asc diam:  3.10 cm MITRAL VALVE                TRICUSPID VALVE MV Area (PHT): 2.99 cm     TR Peak grad:   33.6 mmHg MV Decel Time: 254 msec     TR Vmax:        290.00 cm/s MV E velocity: 145.00 cm/s MV A velocity: 136.00 cm/s  SHUNTS MV E/A ratio:  1.07         Systemic VTI:  0.20 m                             Systemic Diam: 1.90 cm Kirk Ruths MD Electronically signed by Kirk Ruths MD Signature Date/Time: 11/06/2019/4:52:49 PM    Final    Scheduled Meds: . atorvastatin  20 mg Oral Daily  . azithromycin  500 mg Oral Daily  . escitalopram  5 mg Oral Daily  . furosemide  20 mg Oral Daily  . gabapentin  400 mg Oral TID  . guaiFENesin  1,200 mg Oral BID  . insulin aspart  0-9 Units Subcutaneous TID WC  . pantoprazole  40 mg Oral BID  . ropinirole  5 mg Oral QHS   . sodium chloride flush  3 mL Intravenous Q12H  . sodium chloride flush  3 mL Intravenous Q12H  . sodium chloride flush  3 mL Intravenous Q12H   Continuous Infusions: . sodium chloride Stopped (11/06/19 2000)  . cefTRIAXone (ROCEPHIN)  IV 2 g (11/07/19 0809)    LOS: 2 days   Kerney Elbe, DO [facility] PAGER is on AMION  If 7PM-7AM, please contact night-coverage [email or web address]

## 2019-11-07 NOTE — Progress Notes (Signed)
Pt Hr has been slightly elevated all day, but other vitals are stable.  Offered pain meds, but not in any pain.  Repositioned in bed, resting comfortably, call bell in reach.  MD already paged about Hgb being low again this am, will continue to monitor.

## 2019-11-07 NOTE — Progress Notes (Signed)
CRITICAL VALUE ALERT  Critical Value:  HGB 6.7  Date & Time Notied:  11/07/19 0403  Provider Notified: [facility] Dr. Hal Hope  Orders Received/Actions taken: awaiting any new orders

## 2019-11-07 NOTE — Progress Notes (Signed)
PHARMACIST - PHYSICIAN COMMUNICATION DR:   Alfredia Ferguson CONCERNING: Antibiotic IV to Oral Route Change Policy  RECOMMENDATION: This patient is receiving Azithromycin by the intravenous route.  Based on criteria approved by the Pharmacy and Therapeutics Committee, the antibiotic(s) is/are being converted to the equivalent oral dose form(s).  Minda Ditto PharmD 11/07/2019, 11:17 AM   DESCRIPTION: These criteria include:  Patient being treated for a respiratory tract infection, urinary tract infection, cellulitis or clostridium difficile associated diarrhea if on metronidazole  The patient is not neutropenic and does not exhibit a GI malabsorption state  The patient is eating (either orally or via tube) and/or has been taking other orally administered medications for a least 24 hours  The patient is improving clinically and has a Tmax < 100.5  If you have questions about this conversion, please contact the Pharmacy Department  []   [phone] )  [facility] []   [phone] )  [facility] []   [phone] )  [facility] []   [phone] )  [facility] [x]   [phone] )  [facility]

## 2019-11-07 NOTE — Progress Notes (Signed)
Progress Note  Patient Name: Chloe Harding Date of Encounter: 11/07/2019  [facility] Cardiologist: [name], MD   Subjective   No chest pain and no SOB feeling better, receiving blood  Inpatient Medications    Scheduled Meds:  atorvastatin  20 mg Oral Daily   azithromycin  500 mg Oral Daily   escitalopram  5 mg Oral Daily   furosemide  20 mg Oral Daily   gabapentin  400 mg Oral TID   guaiFENesin  1,200 mg Oral BID   insulin aspart  0-9 Units Subcutaneous TID WC   pantoprazole  40 mg Oral BID   ropinirole  5 mg Oral QHS   sodium chloride flush  3 mL Intravenous Q12H   sodium chloride flush  3 mL Intravenous Q12H   sodium chloride flush  3 mL Intravenous Q12H   Continuous Infusions:  sodium chloride Stopped (11/06/19 2000)   cefTRIAXone (ROCEPHIN)  IV 2 g (11/07/19 0809)   PRN Meds: sodium chloride, acetaminophen **OR** acetaminophen, albuterol, benzonatate, bisacodyl, HYDROcodone-acetaminophen, ondansetron **OR** ondansetron (ZOFRAN) IV, senna-docusate, sodium chloride flush   Vital Signs    Vitals:   11/07/19 0500 11/07/19 0509 11/07/19 0914 11/07/19 0949  BP:  133/80 (!) 148/87 127/68  Pulse:  (!) 112 (!) 114 (!) 115  Resp:  18 18 18   Temp:  98 F (36.7 C) 98.7 F (37.1 C) 98.4 F (36.9 C)  TempSrc:  Oral Oral Oral  SpO2:  94% 97% 96%  Weight: 84.2 kg     Height:        Intake/Output Summary (Last 24 hours) at 11/07/2019 1154 Last data filed at 11/07/2019 0915 Gross per 24 hour  Intake 1098.91 ml  Output 1500 ml  Net -401.09 ml   Last 3 Weights 11/07/2019 11/05/2019 11/05/2019  Weight (lbs) 185 lb 10 oz 185 lb 10 oz 175 lb  Weight (kg) 84.2 kg 84.2 kg 79.379 kg      Telemetry    SR to sT - Personally Reviewed  ECG    No new - Personally Reviewed  Physical Exam   GEN: No acute distress.   Neck: No JVD sitting up Cardiac: RRR, no murmurs, rubs, or gallops.  Respiratory: Clear to diminished to auscultation  bilaterally. GI: Soft, nontender, non-distended  MS: No edema; No deformity. Neuro:  Nonfocal  Psych: Normal affect   Labs    High Sensitivity Troponin:   Recent Labs  Lab 11/05/19 1821 11/05/19 2111 11/06/19 0058 11/06/19 0659  TROPONINIHS 54* 345* 1,226* 1,787*      Chemistry Recent Labs  Lab 11/05/19 1821 11/06/19 0058 11/07/19 0328  NA 132* 134* 134*  K 4.4 4.4 4.0  CL 100 100 102  CO2 21* 24 24  GLUCOSE 149* 130* 97  BUN 13 14 17   CREATININE 1.08* 1.18* 1.34*  CALCIUM 8.7* 8.6* 8.2*  PROT 6.6  --  5.9*  ALBUMIN 3.3*  --  2.8*  AST 17  --  22  ALT 9  --  11  ALKPHOS 73  --  62  BILITOT 0.5  --  0.6  GFRNONAA 51* 46* 39*  GFRAA 59* 53* 46*  ANIONGAP 11 10 8      Hematology Recent Labs  Lab 11/05/19 1821 11/06/19 0058 11/07/19 0328  WBC 10.8* 7.8 6.3  RBC 2.98* 2.65*   2.57* 2.57*  HGB 8.1* 7.1* 6.7*  HCT 25.8* 22.9* 22.1*  MCV 86.6 86.4 86.0  MCH 27.2 26.8 26.1  MCHC 31.4 31.0 30.3  RDW 14.0 14.2 14.3  PLT 296 228 211    BNP Recent Labs  Lab 11/05/19 1821  BNP 719.2*     DDimer No results for input(s): DDIMER in the last 168 hours.   Radiology    CT Angio Chest PE W/Cm &/Or Wo Cm  Result Date: 11/05/2019 CLINICAL DATA:  Chest pain and shortness of breath. Recent diagnosis of pneumonia. EXAM: CT ANGIOGRAPHY CHEST WITH CONTRAST TECHNIQUE: Multidetector CT imaging of the chest was performed using the standard protocol during bolus administration of intravenous contrast. Multiplanar CT image reconstructions and MIPs were obtained to evaluate the vascular anatomy. CONTRAST:  52mL OMNIPAQUE IOHEXOL 350 MG/ML SOLN COMPARISON:  Radiograph earlier this day.  Chest CT 01/15/2019 FINDINGS: Cardiovascular: There are no filling defects within the pulmonary arteries to suggest pulmonary embolus. Moderate aortic atherosclerosis. Cannot assess for dissection given phase of contrast tailored to pulmonary artery evaluation. Mitral annulus calcifications.  Coronary artery calcifications. Heart is upper normal in size. Mediastinum/Nodes: Prominent subcarinal node measures 14 mm, series 4, image 54. There are shotty bilateral hilar nodes measuring 10 mm on the left and 8 mm on the right. Hypodense 2.3 cm right thyroid nodule. Patulous esophagus with small to moderate hiatal hernia. Lungs/Pleura: Elevated right hemidiaphragm with compressive atelectasis in the right lower and middle lobes. Small bilateral pleural effusions. Patchy ground-glass opacities in the superior segment of the right lower lobe and throughout the right upper lobe suspicious for pneumonia. There is central bronchial thickening with areas of mucous plugging on the left. Questionable mild pulmonary edema with smooth septal thickening. Upper Abdomen: Small to moderate hiatal hernia. Cholecystectomy. No acute or unexpected findings. Musculoskeletal: Diffuse degenerative change in the spine. There are no acute or suspicious osseous abnormalities. Review of the MIP images confirms the above findings. IMPRESSION: 1. No pulmonary embolus. 2. Patchy ground-glass opacities in the superior segment of the right lower lobe and throughout the right upper lobe suspicious for pneumonia. 3. Elevated right hemidiaphragm with compressive atelectasis in the right middle and lower lobes. 4. Small bilateral pleural effusions. Questionable mild pulmonary edema with smooth septal thickening. 5. Central bronchial thickening with areas of mucous plugging in the left lower lobe. 6. Small to moderate hiatal hernia. 7. Right thyroid nodule measuring 2.3 cm. This was evaluated on thyroid imaging 01/15/2019, please reference that diagnostic ultrasound for further recommendations. Aortic Atherosclerosis (ICD10-I70.0). Electronically Signed   By: Keith Rake M.D.   On: 11/05/2019 20:41   DG CHEST PORT 1 VIEW  Result Date: 11/06/2019 CLINICAL DATA:  Shortness of breath. EXAM: PORTABLE CHEST 1 VIEW COMPARISON:  November 05, 2019. FINDINGS: Stable cardiomediastinal silhouette. Atherosclerosis of thoracic aorta is noted. Hypoinflation of the lungs is noted with mild bibasilar subsegmental atelectasis. No pneumothorax is noted. Small pleural effusions cannot be excluded. Bony thorax is unremarkable. IMPRESSION: Hypoinflation of the lungs with mild bibasilar subsegmental atelectasis. Small pleural effusions cannot be excluded. Electronically Signed   By: Marijo Conception M.D.   On: 11/06/2019 14:31   DG Chest Port 1 View  Result Date: 11/05/2019 CLINICAL DATA:  Fever, shortness of breath EXAM: PORTABLE CHEST 1 VIEW COMPARISON:  10/02/2017 FINDINGS: Low lung volumes. Chronic elevation of the right hemidiaphragm with bibasilar atelectasis. Possible small left pleural effusion. No pneumothorax. Atherosclerotic calcification of the aortic knob. IMPRESSION: Low lung volumes with bibasilar atelectasis. Possible small left pleural effusion. Electronically Signed   By: Davina Poke D.O.   On: 11/05/2019 18:45   ECHOCARDIOGRAM COMPLETE  Result Date:  11/06/2019    ECHOCARDIOGRAM REPORT   Patient Name:   RIKAYLA DEMMON Date of Exam: 11/06/2019 Medical Rec #:  [id number]      Height:       61.0 in Accession #:    [id number]     Weight:       185.6 lb Date of Birth:  January 05, 1946     BSA:          1.830 m Patient Age:    35 years       BP:           114/77 mmHg Patient Gender: F              HR:           106 bpm. Exam Location:  Inpatient Procedure: 2D Echo, Cardiac Doppler and Color Doppler Indications:    I50.33 Acute on chronic diastolic (congestive) heart failure  History:        Patient has no prior history of Echocardiogram examinations.                 Risk Factors:Hypertension, Diabetes and Dyslipidemia. Anemia.  Sonographer:    Jonelle Sidle Dance Referring Phys: [id number] [place]  1. Mild global reduction in LV systolic function; grade 1 diastolic dysfunction; mild MR; mild LAE.  2. Left ventricular ejection fraction, by  estimation, is 45 to 50%. The left ventricle has mildly decreased function. The left ventricle demonstrates global hypokinesis. Left ventricular diastolic parameters are consistent with Grade I diastolic dysfunction (impaired relaxation). Elevated left atrial pressure.  3. Right ventricular systolic function is normal. The right ventricular size is normal. There is mildly elevated pulmonary artery systolic pressure.  4. Left atrial size was mildly dilated.  5. The mitral valve is normal in structure. Mild mitral valve regurgitation. No evidence of mitral stenosis.  6. The aortic valve is tricuspid. Aortic valve regurgitation is not visualized. Mild to moderate aortic valve sclerosis/calcification is present, without any evidence of aortic stenosis.  7. The inferior vena cava is normal in size with greater than 50% respiratory variability, suggesting right atrial pressure of 3 mmHg. FINDINGS  Left Ventricle: Left ventricular ejection fraction, by estimation, is 45 to 50%. The left ventricle has mildly decreased function. The left ventricle demonstrates global hypokinesis. The left ventricular internal cavity size was normal in size. There is  no left ventricular hypertrophy. Left ventricular diastolic parameters are consistent with Grade I diastolic dysfunction (impaired relaxation). Elevated left atrial pressure. Right Ventricle: The right ventricular size is normal. Right ventricular systolic function is normal. There is mildly elevated pulmonary artery systolic pressure. The tricuspid regurgitant velocity is 2.90 m/s, and with an assumed right atrial pressure of 3 mmHg, the estimated right ventricular systolic pressure is 84.1 mmHg. Left Atrium: Left atrial size was mildly dilated. Right Atrium: Right atrial size was normal in size. Pericardium: There is no evidence of pericardial effusion. Mitral Valve: The mitral valve is normal in structure. There is moderate calcification of the mitral valve leaflet(s). Normal  mobility of the mitral valve leaflets. Mild mitral valve regurgitation. No evidence of mitral valve stenosis. Tricuspid Valve: The tricuspid valve is normal in structure. Tricuspid valve regurgitation is mild . No evidence of tricuspid stenosis. Aortic Valve: The aortic valve is tricuspid. Aortic valve regurgitation is not visualized. Mild to moderate aortic valve sclerosis/calcification is present, without any evidence of aortic stenosis. Pulmonic Valve: The pulmonic valve was normal in structure. Pulmonic valve regurgitation is trivial. No  evidence of pulmonic stenosis. Aorta: The aortic root is normal in size and structure. Venous: The inferior vena cava is normal in size with greater than 50% respiratory variability, suggesting right atrial pressure of 3 mmHg. IAS/Shunts: No atrial level shunt detected by color flow Doppler. Additional Comments: Mild global reduction in LV systolic function; grade 1 diastolic dysfunction; mild MR; mild LAE.  LEFT VENTRICLE PLAX 2D LVIDd:         4.30 cm  Diastology LVIDs:         2.80 cm  LV e' lateral:   5.61 cm/s LV PW:         1.30 cm  LV E/e' lateral: 25.8 LV IVS:        1.00 cm  LV e' medial:    5.52 cm/s LVOT diam:     1.90 cm  LV E/e' medial:  26.3 LV SV:         56 LV SV Index:   31 LVOT Area:     2.84 cm  RIGHT VENTRICLE            IVC RV Basal diam:  2.80 cm    IVC diam: 2.00 cm RV S prime:     9.46 cm/s TAPSE (M-mode): 1.6 cm LEFT ATRIUM             Index       RIGHT ATRIUM          Index LA diam:        4.80 cm 2.62 cm/m  RA Area:     9.12 cm LA Vol (A2C):   71.1 ml 38.86 ml/m RA Volume:   19.60 ml 10.71 ml/m LA Vol (A4C):   47.5 ml 25.96 ml/m LA Biplane Vol: 58.5 ml 31.97 ml/m  AORTIC VALVE LVOT Vmax:   105.00 cm/s LVOT Vmean:  71.300 cm/s LVOT VTI:    0.198 m  AORTA Ao Root diam: 2.90 cm Ao Asc diam:  3.10 cm MITRAL VALVE                TRICUSPID VALVE MV Area (PHT): 2.99 cm     TR Peak grad:   33.6 mmHg MV Decel Time: 254 msec     TR Vmax:        290.00  cm/s MV E velocity: 145.00 cm/s MV A velocity: 136.00 cm/s  SHUNTS MV E/A ratio:  1.07         Systemic VTI:  0.20 m                             Systemic Diam: 1.90 cm Kirk Ruths MD Electronically signed by Kirk Ruths MD Signature Date/Time: 11/06/2019/4:52:49 PM    Final     Cardiac Studies   Echo 11/06/19 IMPRESSIONS    1. Mild global reduction in LV systolic function; grade 1 diastolic  dysfunction; mild MR; mild LAE.  2. Left ventricular ejection fraction, by estimation, is 45 to 50%. The  left ventricle has mildly decreased function. The left ventricle  demonstrates global hypokinesis. Left ventricular diastolic parameters are  consistent with Grade I diastolic  dysfunction (impaired relaxation). Elevated left atrial pressure.  3. Right ventricular systolic function is normal. The right ventricular  size is normal. There is mildly elevated pulmonary artery systolic  pressure.  4. Left atrial size was mildly dilated.  5. The mitral valve is normal in structure. Mild mitral valve  regurgitation. No evidence of  mitral stenosis.  6. The aortic valve is tricuspid. Aortic valve regurgitation is not  visualized. Mild to moderate aortic valve sclerosis/calcification is  present, without any evidence of aortic stenosis.  7. The inferior vena cava is normal in size with greater than 50%  respiratory variability, suggesting right atrial pressure of 3 mmHg.   FINDINGS  Left Ventricle: Left ventricular ejection fraction, by estimation, is 45  to 50%. The left ventricle has mildly decreased function. The left  ventricle demonstrates global hypokinesis. The left ventricular internal  cavity size was normal in size. There is  no left ventricular hypertrophy. Left ventricular diastolic parameters  are consistent with Grade I diastolic dysfunction (impaired relaxation).  Elevated left atrial pressure.   Right Ventricle: The right ventricular size is normal. Right ventricular   systolic function is normal. There is mildly elevated pulmonary artery  systolic pressure. The tricuspid regurgitant velocity is 2.90 m/s, and  with an assumed right atrial pressure  of 3 mmHg, the estimated right ventricular systolic pressure is 03.4 mmHg.   Left Atrium: Left atrial size was mildly dilated.   Right Atrium: Right atrial size was normal in size.   Pericardium: There is no evidence of pericardial effusion.   Mitral Valve: The mitral valve is normal in structure. There is moderate  calcification of the mitral valve leaflet(s). Normal mobility of the  mitral valve leaflets. Mild mitral valve regurgitation. No evidence of  mitral valve stenosis.   Tricuspid Valve: The tricuspid valve is normal in structure. Tricuspid  valve regurgitation is mild . No evidence of tricuspid stenosis.   Aortic Valve: The aortic valve is tricuspid. Aortic valve regurgitation is  not visualized. Mild to moderate aortic valve sclerosis/calcification is  present, without any evidence of aortic stenosis.   Pulmonic Valve: The pulmonic valve was normal in structure. Pulmonic valve  regurgitation is trivial. No evidence of pulmonic stenosis.   Aorta: The aortic root is normal in size and structure.   Venous: The inferior vena cava is normal in size with greater than 50%  respiratory variability, suggesting right atrial pressure of 3 mmHg.   IAS/Shunts: No atrial level shunt detected by color flow Doppler.   Additional Comments: Mild global reduction in LV systolic function; grade  1 diastolic dysfunction; mild MR; mild LAE.      Patient Profile     74 y.o. female with a hx of DM, HLD, HTN and renal disorder and recent PNA  Admitted 11/05/19 with SOB and found to be anemic and with PNA, HF, elevated troponin,  EF decreased on echo.   Assessment & Plan    1. PNA followed by IM and treatment as outpt but symptoms increased, now less fever on zithromax IV and rocephin per IM 2. Acute HF  combined systolic and diastolic with edema and SOB with elevated BNP, small pl effusions. On IV lasix  She is neg 2370 cc --- wt is same-  Echo with decrease in EF to 45-50% and G1DD mild MR,  ( with exertion last week significant SOB and chest pain)   3. Elevated troponin pk this AM 1787  EKG without acute changes.  May be demand ischemia but she has several risk factors for CAD with HTN, DM, HLD also FH of CAD beginning at age 55.   Would treat anemia and plan for ischemic work up once anemia and infection cleared 4. Acute anemia with Hgb 7.1  And now 6.7.  Marland Kitchenand stool neg for blood with cardiac issues  would want Hgb to 8-- being transfused now per  IM 5. DM -2 per IM 6. HTN controlled was on lisinopril 2.5 mg at home  Her lasix was prn. Now on lasix and lisinopril on hold. BP elevated now 150/86  7. HLD  added lipitor       For questions or updates, please contact [facility] Please consult [email or web address] for contact info under        Signed, Cecilie Kicks, NP  11/07/2019, 11:54 AM

## 2019-11-08 DIAGNOSIS — R000 Tachycardia, unspecified: Secondary | ICD-10-CM

## 2019-11-08 DIAGNOSIS — I248 Other forms of acute ischemic heart disease: Secondary | ICD-10-CM

## 2019-11-08 LAB — COMPREHENSIVE METABOLIC PANEL
ALT: 12 U/L (ref 0–44)
AST: 22 U/L (ref 15–41)
Albumin: 3 g/dL — ABNORMAL LOW (ref 3.5–5.0)
Alkaline Phosphatase: 62 U/L (ref 38–126)
Anion gap: 9 (ref 5–15)
BUN: 12 mg/dL (ref 8–23)
CO2: 23 mmol/L (ref 22–32)
Calcium: 8.8 mg/dL — ABNORMAL LOW (ref 8.9–10.3)
Chloride: 102 mmol/L (ref 98–111)
Creatinine, Ser: 0.95 mg/dL (ref 0.44–1.00)
GFR calc Af Amer: >60 mL/min (ref >60)
GFR calc non Af Amer: 60 mL/min — ABNORMAL LOW (ref >60)
Glucose, Bld: 116 mg/dL — ABNORMAL HIGH (ref 70–99)
Potassium: 3.4 mmol/L — ABNORMAL LOW (ref 3.5–5.1)
Sodium: 134 mmol/L — ABNORMAL LOW (ref 135–145)
Total Bilirubin: 0.6 mg/dL (ref 0.3–1.2)
Total Protein: 6.6 g/dL (ref 6.5–8.1)

## 2019-11-08 LAB — BPAM RBC
Blood Product Expiration Date: 202107232359
ISSUE DATE / TIME: 202106220915
Unit Type and Rh: 5100

## 2019-11-08 LAB — TYPE AND SCREEN
ABO/RH(D): O POS
Antibody Screen: NEGATIVE
Unit division: 0

## 2019-11-08 LAB — CBC WITH DIFFERENTIAL/PLATELET
Abs Immature Granulocytes: 0.01 10*3/uL (ref 0.00–0.07)
Basophils Absolute: 0 10*3/uL (ref 0.0–0.1)
Basophils Relative: 1 %
Eosinophils Absolute: 0.3 10*3/uL (ref 0.0–0.5)
Eosinophils Relative: 5 %
HCT: 30.7 % — ABNORMAL LOW (ref 36.0–46.0)
Hemoglobin: 9.3 g/dL — ABNORMAL LOW (ref 12.0–15.0)
Immature Granulocytes: 0 %
Lymphocytes Relative: 29 %
Lymphs Abs: 1.7 10*3/uL (ref 0.7–4.0)
MCH: 25.7 pg — ABNORMAL LOW (ref 26.0–34.0)
MCHC: 30.3 g/dL (ref 30.0–36.0)
MCV: 84.8 fL (ref 80.0–100.0)
Monocytes Absolute: 0.6 10*3/uL (ref 0.1–1.0)
Monocytes Relative: 10 %
Neutro Abs: 3.2 10*3/uL (ref 1.7–7.7)
Neutrophils Relative %: 55 %
Platelets: 237 10*3/uL (ref 150–400)
RBC: 3.62 MIL/uL — ABNORMAL LOW (ref 3.87–5.11)
RDW: 15.4 % (ref 11.5–15.5)
WBC: 5.8 10*3/uL (ref 4.0–10.5)
nRBC: 0 % (ref 0.0–0.2)

## 2019-11-08 LAB — MAGNESIUM: Magnesium: 1.8 mg/dL (ref 1.7–2.4)

## 2019-11-08 LAB — GLUCOSE, CAPILLARY
Glucose-Capillary: 93 mg/dL (ref 70–99)
Glucose-Capillary: 104 mg/dL — ABNORMAL HIGH (ref 70–99)
Glucose-Capillary: 119 mg/dL — ABNORMAL HIGH (ref 70–99)
Glucose-Capillary: 126 mg/dL — ABNORMAL HIGH (ref 70–99)

## 2019-11-08 LAB — PROCALCITONIN: Procalcitonin: <0.1 ng/mL

## 2019-11-08 LAB — PHOSPHORUS: Phosphorus: 3.1 mg/dL (ref 2.5–4.6)

## 2019-11-08 MED ORDER — POLYETHYLENE GLYCOL 3350 17 G PO PACK: 17.0000 g | PACK | Freq: Every day | ORAL | Status: DC | PRN

## 2019-11-08 MED ORDER — METOPROLOL TARTRATE 25 MG PO TABS
25.0000 mg | ORAL_TABLET | Freq: Two times a day (BID) | ORAL | Status: DC
  Administered 2019-11-08 – 2019-11-09 (×3): 25 mg via ORAL
  Filled 2019-11-08 (×4): qty 1

## 2019-11-08 MED ORDER — SODIUM CHLORIDE 0.9 % IV SOLN
510.0000 mg | Freq: Once | INTRAVENOUS | Status: AC
  Administered 2019-11-08: 510 mg via INTRAVENOUS
  Filled 2019-11-08: qty 510

## 2019-11-08 MED ORDER — FERROUS SULFATE 325 (65 FE) MG PO TABS
325.0000 mg | ORAL_TABLET | Freq: Two times a day (BID) | ORAL | Status: DC
  Administered 2019-11-09: 325 mg via ORAL
  Filled 2019-11-08: qty 1

## 2019-11-08 MED ORDER — SENNOSIDES-DOCUSATE SODIUM 8.6-50 MG PO TABS
2.0000 | ORAL_TABLET | Freq: Every day | ORAL | Status: DC
  Administered 2019-11-08: 2 via ORAL
  Filled 2019-11-08: qty 2

## 2019-11-08 NOTE — Evaluation (Signed)
Physical Therapy Evaluation Patient Details Name: Chloe Harding MRN: [id number] DOB: [birth year] Today's Date: 11/08/2019   History of Present Illness  Pt is 74 y.o. female with medical history significant for hypertension, type 2 diabetes mellitus, and arthritis, now presenting to the emergency department with shortness of breath and cough.  CTA negative for PE but concerning for pneumonia, atelectasis and fluid overload.  Recently completed outpatient amoxicillin.  Pt also received 1 unit PRBC and had negative work up for GI Bleed.  Clinical Impression  Pt admitted with above diagnosis. She presented with mild decreases in her mobility, balance, safety awareness, and strength.  Pt did have some lightheadedness with walking but orthostatic bps were stable and HR was 95 bpm rest and 106 bpm walking.  She demonstrates unsteadiness without AD - educated on recommendation for use of AD at home. Pt currently with functional limitations due to the deficits listed below (see PT Problem List). Pt will benefit from skilled PT to increase their independence and safety with mobility to allow discharge to the venue listed below.       Follow Up Recommendations No PT follow up;Supervision for mobility/OOB    Equipment Recommendations  None recommended by PT (has DME)    Recommendations for Other Services       Precautions / Restrictions Precautions Precautions: None      Mobility  Bed Mobility Overal bed mobility: Modified Independent             General bed mobility comments: Use of bed rails  Transfers Overall transfer level: Needs assistance Equipment used: None Transfers: Sit to/from Stand Sit to Stand: Min guard         General transfer comment: sit to stand x 2; min guard for safety  Ambulation/Gait Ambulation/Gait assistance: Min guard Gait Distance (Feet): 100 Feet (100' then 50') Assistive device: 1 person hand held assist;None Gait Pattern/deviations: Decreased  stride length;Shuffle;Wide base of support Gait velocity: decreased   General Gait Details: Pt reported not using AD at home so began ambulation without AD.  However, pt unsteady when walking in hall and upon further questions reports "furniture surfs" at home.  Completed walk with HHA or rail.  Toward end of walk pt had c/o lightheadiness and not feeling well. HR 95 rest and 106 walking.  Upon sitting bp was 128/73.  Once recovered had pt stand with BP 135/72.  Walked in room 64' and pt demonstrated improved stability by holding cabinet/wall/bed and did not experience lightheadedness.  Post walk and standing bp 125/74.  Stairs            Wheelchair Mobility    Modified Rankin (Stroke Patients Only)       Balance Overall balance assessment: Needs assistance   Sitting balance-Leahy Scale: Normal       Standing balance-Leahy Scale: Good Standing balance comment: Can maintain static standing and min challenges without UE support                             Pertinent Vitals/Pain Pain Assessment: No/denies pain    Home Living Family/patient expects to be discharged to:: Private residence Living Arrangements: Spouse/significant other (spouse works) Available Help at Discharge: Available PRN/intermittently Type of Home: House Home Access: Stairs to enter Entrance Stairs-Rails: Right Entrance Stairs-Number of Steps: 10 in front, ramp in the back Home Layout: One level Home Equipment: [place] - 2 wheels;Cane - single point;Toilet riser  Prior Function Level of Independence: Independent with assistive device(s)      ADL's / Homemaking Assistance Needed: independent  Comments: Reports uses cane in community and holds onto walls/furniture in house.  Denies recent falls     Hand Dominance   Dominant Hand: Right    Extremity/Trunk Assessment   Upper Extremity Assessment Upper Extremity Assessment: Overall WFL for tasks assessed    Lower Extremity  Assessment Lower Extremity Assessment: Overall WFL for tasks assessed    Cervical / Trunk Assessment Cervical / Trunk Assessment: Normal  Communication   Communication: No difficulties  Cognition Arousal/Alertness: Awake/alert Behavior During Therapy: WFL for tasks assessed/performed Overall Cognitive Status: Within Functional Limits for tasks assessed                                        General Comments General comments (skin integrity, edema, etc.): Pt reports she has been ambulating to bathroom and considering hall on her own.  Advised to have assist from nursing with walking for safety at this time.  Also, educated on use of AD at home for improved stability.    Exercises     Assessment/Plan    PT Assessment Patient needs continued PT services  PT Problem List Decreased strength;Decreased mobility;Decreased safety awareness;Decreased range of motion;Decreased activity tolerance;Decreased balance;Decreased knowledge of use of DME       PT Treatment Interventions DME instruction;Therapeutic activities;Gait training;Therapeutic exercise;Patient/family education;Stair training;Balance training;Functional mobility training    PT Goals (Current goals can be found in the Care Plan section)  Acute Rehab PT Goals Patient Stated Goal: return home PT Goal Formulation: With patient Time For Goal Achievement: 11/22/19 Potential to Achieve Goals: Good    Frequency Min 3X/week   Barriers to discharge        Co-evaluation               AM-PAC PT "6 Clicks" Mobility  Outcome Measure Help needed turning from your back to your side while in a flat bed without using bedrails?: None Help needed moving from lying on your back to sitting on the side of a flat bed without using bedrails?: None Help needed moving to and from a bed to a chair (including a wheelchair)?: None Help needed standing up from a chair using your arms (e.g., wheelchair or bedside chair)?: A  Little Help needed to walk in hospital room?: A Little Help needed climbing 3-5 steps with a railing? : A Little 6 Click Score: 21    End of Session Equipment Utilized During Treatment: Gait belt Activity Tolerance: Patient tolerated treatment well Patient left: in bed;with call bell/phone within reach;with bed alarm set Nurse Communication: Mobility status PT Visit Diagnosis: Unsteadiness on feet (R26.81)    Time: 8592-9244 PT Time Calculation (min) (ACUTE ONLY): 31 min   Charges:   PT Evaluation $PT Eval Moderate Complexity: 1 Mod PT Treatments $Gait Training: 8-22 mins        Abran [name], PT Acute Rehab Services Pager [phone] [facility] [phone]    Karlton Lemon 11/08/2019, 4:53 PM

## 2019-11-08 NOTE — Care Management Important Message (Signed)
Important Message  Patient Details IM Letter given to [place] Case Manager to present to the Patient Name: Chloe Harding MRN: [id number] Date of Birth: 1945/06/14   Medicare Important Message Given:  Yes     Kerin Salen 11/08/2019, 10:16 AM

## 2019-11-08 NOTE — Progress Notes (Signed)
Patient is in the Yellow MEWS.  VS  T 98.2 BP 153/97 (113) RR 20 O2 = 97 % room air  Patient is A&O x 4.  No distress.  Notified charge nurse Durwin Nora, RN. No interventions at this time.

## 2019-11-08 NOTE — Evaluation (Signed)
Clinical/Bedside Swallow Evaluation Patient Details  Name: JEANNETTA CERUTTI MRN: [id number] Date of Birth: 01/05/1946  Today's Date: 11/08/2019 Time: SLP Start Time (ACUTE ONLY): 0815 SLP Stop Time (ACUTE ONLY): 0841 SLP Time Calculation (min) (ACUTE ONLY): 26 min  Past Medical History:  Past Medical History:  Diagnosis Date  . Arthritis   . Depression   . Diabetes mellitus   . Hiatal hernia   . Hypercholesteremia   . Hypertension   . Reflux   . Renal disorder    shutting down 4 years ago   Past Surgical History:  Past Surgical History:  Procedure Laterality Date  . CHOLECYSTECTOMY    . COLONOSCOPY WITH PROPOFOL N/A 05/20/2017   Procedure: COLONOSCOPY WITH PROPOFOL;  Surgeon: Carol Ada, MD;  Location: [facility];  Service: Endoscopy;  Laterality: N/A;  . ESOPHAGOGASTRODUODENOSCOPY N/A 05/19/2017   Procedure: ESOPHAGOGASTRODUODENOSCOPY (EGD);  Surgeon: Juanita Craver, MD;  Location: [facility] ENDOSCOPY;  Service: Endoscopy;  Laterality: N/A;  . ESOPHAGOGASTRODUODENOSCOPY (EGD) WITH PROPOFOL N/A 01/17/2019   Procedure: ESOPHAGOGASTRODUODENOSCOPY (EGD) WITH PROPOFOL;  Surgeon: Carol Ada, MD;  Location: [facility] ENDOSCOPY;  Service: Endoscopy;  Laterality: N/A;  . SAVORY DILATION N/A 01/17/2019   Procedure: SAVORY DILATION;  Surgeon: Carol Ada, MD;  Location: [facility] ENDOSCOPY;  Service: Endoscopy;  Laterality: N/A;   HPI:  74 yo female adm to [facility] with malaise.  Pt has h/o HTN, DM, arthritis - anemic with elevated troponin noted.  Pt cxr concerning for pna in right upper and lower lobe.  Swallow eval ordered.   Assessment / Plan / Recommendation Clinical Impression  Functional oropharyngeal swallow ability clinically apparent.  No focal CN deficits but pt does appear with increased fullness on right side of neck. She states her right neck had been biopsied and was benign.  No indications of dyspahgia at this time.  She does have h/o proximal esophageal stricturing requiring diliatation and is to  follow up with Dr Benson [place] *GI* within the year.  Pt admits to pills sticking in throat prior to dilatation that has resolved.  She passed 3 ounce water test easily.  Provided her with esophageal compensation strategies and heimlich manuever information.  Pt has taken pills in the past with applesauce but no longer needs to do this since dilated.  Thanks for the consult. SLP Visit Diagnosis: Dysphagia, unspecified (R13.10)    Aspiration Risk  Mild aspiration risk    Diet Recommendation Regular;Thin liquid   Liquid Administration via: Cup;Straw Medication Administration: Whole meds with liquid Supervision: Patient able to self feed Compensations: Slow rate;Small sips/bites Postural Changes: Seated upright at 90 degrees    Other  Recommendations Oral Care Recommendations: Oral care BID   Follow up Recommendations None      Frequency and Duration      n/a      Prognosis   n/a     Swallow Study   General Date of Onset: 11/08/19 HPI: 74 yo female adm to [facility] with malaise.  Pt has h/o HTN, DM, arthritis - anemic with elevated troponin noted.  Pt cxr concerning for pna in right upper and lower lobe.  Swallow eval ordered. Type of Study: Bedside Swallow Evaluation Diet Prior to this Study: Thin liquids;Regular Temperature Spikes Noted: No Respiratory Status: Room air History of Recent Intubation: No Behavior/Cognition: Alert;Cooperative;Pleasant mood Oral Cavity Assessment: Within Functional Limits Oral Care Completed by SLP: No Oral Cavity - Dentition: Other (Comment);Poor condition (few upper decayed dentition) Vision: Functional for self-feeding Self-Feeding Abilities: Able to feed self Patient  Positioning: Upright in bed Baseline Vocal Quality: Normal Volitional Cough: Strong Volitional Swallow: Able to elicit    Oral/Motor/Sensory Function Overall Oral Motor/Sensory Function: Mild impairment   Ice Chips Ice chips: Not tested   Thin Liquid Thin Liquid: Within functional  limits Presentation: Cup;Straw    Nectar Thick Nectar Thick Liquid: Not tested   Honey Thick Honey Thick Liquid: Not tested   Puree Puree: Within functional limits Presentation: Self Fed;Spoon   Solid     Solid: Not tested      Macario Golds 11/08/2019,9:10 AM  Kathleen Lime, MS [facility] [phone]

## 2019-11-08 NOTE — Progress Notes (Signed)
PROGRESS NOTE    Chloe Harding  RCV:[id number] DOB: [birth year] DOA: 11/05/2019 PCP: [name], NP   Brief Narrative:  74 y.o.femalewith medical history significant forhypertension, type 2 diabetes mellitus, and arthritis, now presenting to the emergency department with shortness of breath and cough.  CTA negative for PE but concerning for pneumonia, atelectasis and fluid overload.  Recently completed outpatient amoxicillin, speech and swallow-recommend regular diet.  Upon admission started on Rocephin and IV diuretics.  Received 1 unit PRBC transfusion, work-up for GI bleed was negative.   Assessment & Plan:   Principal Problem:   Pneumonia Active Problems:   Type 2 diabetes mellitus (HCC)   ANEMIA   Mild renal insufficiency   Hypertension   Pure hypercholesterolemia   Hyponatremia   Acute CHF (congestive heart failure) (HCC)   SOB (shortness of breath)   Demand ischemia (HCC)   CAD in native artery   Acute hypoxic respiratory failure, 2 L nasal cannula.  Multifactorial Acute congestive heart failure with reduced ejection fraction, 45%.  Grade 1 DD, class II Community-acquired pneumonia, right lower lobe Speech and swallow recommends regular diet Check procalcitonin, currently on IV Rocephin and azithromycin.  Should be able to stop in next 1-2 days Bronchodilators scheduled and as needed Supportive treatment Diuretics per cardiology team.  Approaching euvolemia. Echocardiogram-EF 45-50%, grade 1 DD.  Elevated troponin secondary to demand ischemia -Seen by cardiology.  Supportive care.  Eventually will need coronary assessment once stable  Iron deficiency anemia IV iron today, p.o. with bowel regimen starting tomorrow Outpatient follow-up with GI.   EGD 9/20-esophageal stenosis status post dilatation, hiatal hernia.   C-scope January 2000 19-3 mm polyp and diverticulosis.  Mild renal insufficiency/AKI, worsening  Metabolic acidosis, improved  Prerenal,  currently resolved  Hypertensionwith sinus tachycardia Resume home meds.  Lisinopril on hold. Metoprolol 25 mg twice daily added  Type II DM Peripheral neuropathy Hemoglobin A1c 5.6.  Accu-Chek and sliding scale Gabapentin 4 mg 3 times daily  Obesity -Estimated body mass index is 35.07 kg/m   GERD -C/w PPI with Pantoprazole 40 mg po BID   Depression -C/w Escitalopram 5 mg po Daily  HLD -C/w Atorvastatin 20 mg po Daily   DVT prophylaxis: SCDs Code Status: Full code Family Communication: None  Status is: Inpatient  Remains inpatient appropriate because:Inpatient level of care appropriate due to severity of illness   Dispo: The patient is from: Home              Anticipated d/c is to: Home              Anticipated d/c date is: 1 day              Patient currently is not medically stable to d/c.  Patient currently in sinus tachycardia, adding beta-blocker.  In the meantime awaiting PT evaluation  Subjective: Symptomatically she is feeling better this morning denies any complaints.  Heart rate went up to 120s with minimal ambulation.  Review of Systems Otherwise negative except as per HPI, including: General: Denies fever, chills, night sweats or unintended weight loss. Resp: Denies cough, wheezing, shortness of breath. Cardiac: Denies chest pain, palpitations, orthopnea, paroxysmal nocturnal dyspnea. GI: Denies abdominal pain, nausea, vomiting, diarrhea or constipation GU: Denies dysuria, frequency, hesitancy or incontinence MS: Denies muscle aches, joint pain or swelling Neuro: Denies headache, neurologic deficits (focal weakness, numbness, tingling), abnormal gait Psych: Denies anxiety, depression, SI/HI/AVH Skin: Denies new rashes or lesions ID: Denies sick contacts, exotic exposures, travel  Examination:  General exam: Appears calm and comfortable  Respiratory system: Clear to auscultation. Respiratory effort normal. Cardiovascular system: S1 & S2  heard, RRR. No JVD, murmurs, rubs, gallops or clicks. No pedal edema. Gastrointestinal system: Abdomen is nondistended, soft and nontender. No organomegaly or masses felt. Normal bowel sounds heard. Central nervous system: Alert and oriented. No focal neurological deficits. Extremities: Symmetric 5 x 5 power. Skin: No rashes, lesions or ulcers Psychiatry: Judgement and insight appear normal. Mood & affect appropriate.     Objective: Vitals:   11/08/19 0517 11/08/19 0751 11/08/19 1023 11/08/19 1135  BP: 132/75 (!) 159/100  (!) 153/97  Pulse: (!) 110 (!) 110 (!) 123 (!) 113  Resp: 18 20  20   Temp: 97.9 F (36.6 C) 98.9 F (37.2 C)  98.2 F (36.8 C)  TempSrc:  Oral  Oral  SpO2: 95% 97%  97%  Weight:      Height:        Intake/Output Summary (Last 24 hours) at 11/08/2019 1215 Last data filed at 11/08/2019 0500 Gross per 24 hour  Intake 1200 ml  Output --  Net 1200 ml   Filed Weights   11/05/19 2332 11/07/19 0500 11/08/19 0500  Weight: 84.2 kg 84.2 kg 78.6 kg     Data Reviewed:   CBC: Recent Labs  Lab 11/05/19 1821 11/06/19 0058 11/07/19 0328 11/08/19 0344  WBC 10.8* 7.8 6.3 5.8  NEUTROABS 9.2*  --  3.9 3.2  HGB 8.1* 7.1* 6.7* 9.3*  HCT 25.8* 22.9* 22.1* 30.7*  MCV 86.6 86.4 86.0 84.8  PLT 296 228 211 412   Basic Metabolic Panel: Recent Labs  Lab 11/05/19 1821 11/06/19 0058 11/07/19 0328 11/08/19 0344  NA 132* 134* 134* 134*  K 4.4 4.4 4.0 3.4*  CL 100 100 102 102  CO2 21* 24 24 23   GLUCOSE 149* 130* 97 116*  BUN 13 14 17 12   CREATININE 1.08* 1.18* 1.34* 0.95  CALCIUM 8.7* 8.6* 8.2* 8.8*  MG  --  1.9 2.0 1.8  PHOS  --   --  3.6 3.1   GFR: Estimated Creatinine Clearance: 50.8 mL/min (by C-G formula based on SCr of 0.95 mg/dL). Liver Function Tests: Recent Labs  Lab 11/05/19 1821 11/07/19 0328 11/08/19 0344  AST 17 22 22   ALT 9 11 12   ALKPHOS 73 62 62  BILITOT 0.5 0.6 0.6  PROT 6.6 5.9* 6.6  ALBUMIN 3.3* 2.8* 3.0*   No results for  input(s): LIPASE, AMYLASE in the last 168 hours. No results for input(s): AMMONIA in the last 168 hours. Coagulation Profile: Recent Labs  Lab 11/05/19 1821  INR 1.1   Cardiac Enzymes: No results for input(s): CKTOTAL, CKMB, CKMBINDEX, TROPONINI in the last 168 hours. BNP (last 3 results) No results for input(s): PROBNP in the last 8760 hours. HbA1C: Recent Labs    11/06/19 0058  HGBA1C 5.6   CBG: Recent Labs  Lab 11/07/19 1142 11/07/19 1705 11/07/19 2112 11/08/19 0800 11/08/19 1200  GLUCAP 116* 114* 113* 93 104*   Lipid Profile: No results for input(s): CHOL, HDL, LDLCALC, TRIG, CHOLHDL, LDLDIRECT in the last 72 hours. Thyroid Function Tests: No results for input(s): TSH, T4TOTAL, FREET4, T3FREE, THYROIDAB in the last 72 hours. Anemia Panel: Recent Labs    11/06/19 0058  VITAMINB12 583  FOLATE 53.5  FERRITIN 18  TIBC 311  IRON 20*  RETICCTPCT 2.0   Sepsis Labs: Recent Labs  Lab 11/05/19 1821 11/05/19 2111  LATICACIDVEN 1.3 1.1  Recent Results (from the past 240 hour(s))  Blood Culture (routine x 2)     Status: None (Preliminary result)   Collection Time: 11/05/19  6:21 PM   Specimen: BLOOD  Result Value Ref Range Status   Specimen Description   Final    BLOOD RIGHT ANTECUBITAL Performed at [facility] [address], [place], [place] [address]    Special Requests   Final    BOTTLES DRAWN AEROBIC AND ANAEROBIC Blood Culture adequate volume Performed at [facility] [address] [place], [place] [address]    Culture   Final    NO GROWTH 3 DAYS Performed at [facility], [place] [address], [place], [place] [address]    Report Status PENDING  Incomplete  SARS Coronavirus 2 by RT PCR (hospital order, performed in [facility] hospital lab) Nasopharyngeal Nasopharyngeal Swab     Status: None   Collection Time: 11/05/19  6:21 PM   Specimen: Nasopharyngeal Swab  Result Value Ref Range Status   SARS  Coronavirus 2 NEGATIVE NEGATIVE Final    Comment: (NOTE) SARS-CoV-2 target nucleic acids are NOT DETECTED.  The SARS-CoV-2 RNA is generally detectable in upper and lower respiratory specimens during the acute phase of infection. The lowest concentration of SARS-CoV-2 viral copies this assay can detect is 250 copies / mL. A negative result does not preclude SARS-CoV-2 infection and should not be used as the sole basis for treatment or other patient management decisions.  A negative result may occur with improper specimen collection / handling, submission of specimen other than nasopharyngeal swab, presence of viral mutation(s) within the areas targeted by this assay, and inadequate number of viral copies (<250 copies / mL). A negative result must be combined with clinical observations, patient history, and epidemiological information.  Fact Sheet for Patients:   StrictlyIdeas.no  Fact Sheet for Healthcare Providers: BankingDealers.co.za  This test is not yet approved or  cleared by the [place] FDA and has been authorized for detection and/or diagnosis of SARS-CoV-2 by FDA under an Emergency Use Authorization (EUA).  This EUA will remain in effect (meaning this test can be used) for the duration of the COVID-19 declaration under Section 564(b)(1) of the Act, 21 U.S.C. section 360bbb-3(b)(1), unless the authorization is terminated or revoked sooner.  Performed at [facility], [place] [address] [place], [place] [address]   Blood Culture (routine x 2)     Status: None (Preliminary result)   Collection Time: 11/05/19  6:41 PM   Specimen: BLOOD LEFT FOREARM  Result Value Ref Range Status   Specimen Description   Final    BLOOD LEFT FOREARM Performed at [facility] [address] [place], [place] [address]    Special Requests   Final    BOTTLES DRAWN AEROBIC ONLY Blood Culture results may not  be optimal due to an inadequate volume of blood received in culture bottles Performed at [facility] [address] [place], [place] [address]    Culture   Final    NO GROWTH 3 DAYS Performed at [facility], [place] [address], [place], [place] [address]    Report Status PENDING  Incomplete  Urine culture     Status: None   Collection Time: 11/06/19  1:00 AM   Specimen: In/Out Cath Urine  Result Value Ref Range Status   Specimen Description   Final    IN/OUT CATH URINE Performed at [facility], [place] [address] [place], [place] [address]    Special Requests   Final    NONE Performed at [facility], [place] [address] [place], [place] [address]    Culture   Final    NO GROWTH Performed at [facility], [place] [address] [place],  [address]    Report Status 11/07/2019 FINAL  Final         Radiology Studies: DG CHEST PORT 1 VIEW  Result Date: 11/07/2019 CLINICAL DATA:  74 year old female with shortness of breath. EXAM: PORTABLE CHEST 1 VIEW COMPARISON:  Chest radiograph dated 11/06/2019 and CT dated 11/05/2019. FINDINGS: Shallow inspiration with bibasilar atelectasis. Infiltrate is not excluded. Clinical correlation is recommended. Small bilateral pleural effusions. No pneumothorax. Stable cardiomediastinal silhouette. Atherosclerotic calcification of the aorta. No acute osseous pathology. IMPRESSION: 1. Shallow inspiration with bibasilar atelectasis. Infiltrate is not excluded. 2. Small bilateral pleural effusions. Electronically Signed   By: Anner Crete M.D.   On: 11/07/2019 16:38   DG CHEST PORT 1 VIEW  Result Date: 11/06/2019 CLINICAL DATA:  Shortness of breath. EXAM: PORTABLE CHEST 1 VIEW COMPARISON:  November 05, 2019. FINDINGS: Stable cardiomediastinal silhouette. Atherosclerosis of thoracic aorta is noted. Hypoinflation of the lungs is noted with mild bibasilar subsegmental atelectasis. No pneumothorax  is noted. Small pleural effusions cannot be excluded. Bony thorax is unremarkable. IMPRESSION: Hypoinflation of the lungs with mild bibasilar subsegmental atelectasis. Small pleural effusions cannot be excluded. Electronically Signed   By: Marijo Conception M.D.   On: 11/06/2019 14:31   ECHOCARDIOGRAM COMPLETE  Result Date: 11/06/2019    ECHOCARDIOGRAM REPORT   Patient Name:   BRIANNAH LONA Date of Exam: 11/06/2019 Medical Rec #:  [id number]      Height:       61.0 in Accession #:    [id number]     Weight:       185.6 lb Date of Birth:  03-Sep-1945     BSA:          1.830 m Patient Age:    51 years       BP:           114/77 mmHg Patient Gender: F              HR:           106 bpm. Exam Location:  Inpatient Procedure: 2D Echo, Cardiac Doppler and Color Doppler Indications:    I50.33 Acute on chronic diastolic (congestive) heart failure  History:        Patient has no prior history of Echocardiogram examinations.                 Risk Factors:Hypertension, Diabetes and Dyslipidemia. Anemia.  Sonographer:    Jonelle Sidle Dance Referring Phys: [id number] [place]  1. Mild global reduction in LV systolic function; grade 1 diastolic dysfunction; mild MR; mild LAE.  2. Left ventricular ejection fraction, by estimation, is 45 to 50%. The left ventricle has mildly decreased function. The left ventricle demonstrates global hypokinesis. Left ventricular diastolic parameters are consistent with Grade I diastolic dysfunction (impaired relaxation). Elevated left atrial pressure.  3. Right ventricular systolic function is normal. The right ventricular size is normal. There is mildly elevated pulmonary artery systolic pressure.  4. Left atrial size was mildly dilated.  5. The mitral valve is normal in structure. Mild mitral valve regurgitation. No evidence of mitral stenosis.  6. The aortic valve is tricuspid. Aortic valve regurgitation  is not visualized. Mild to moderate aortic valve sclerosis/calcification is present,  without any evidence of aortic stenosis.  7. The inferior vena cava is normal in size with greater than 50% respiratory variability, suggesting right atrial pressure of 3 mmHg. FINDINGS  Left Ventricle: Left ventricular ejection fraction, by estimation, is 45 to 50%. The left ventricle has mildly decreased function. The left ventricle demonstrates global hypokinesis. The left ventricular internal cavity size was normal in size. There is  no left ventricular hypertrophy. Left ventricular diastolic parameters are consistent with Grade I diastolic dysfunction (impaired relaxation). Elevated left atrial pressure. Right Ventricle: The right ventricular size is normal. Right ventricular systolic function is normal. There is mildly elevated pulmonary artery systolic pressure. The tricuspid regurgitant velocity is 2.90 m/s, and with an assumed right atrial pressure of 3 mmHg, the estimated right ventricular systolic pressure is 65.0 mmHg. Left Atrium: Left atrial size was mildly dilated. Right Atrium: Right atrial size was normal in size. Pericardium: There is no evidence of pericardial effusion. Mitral Valve: The mitral valve is normal in structure. There is moderate calcification of the mitral valve leaflet(s). Normal mobility of the mitral valve leaflets. Mild mitral valve regurgitation. No evidence of mitral valve stenosis. Tricuspid Valve: The tricuspid valve is normal in structure. Tricuspid valve regurgitation is mild . No evidence of tricuspid stenosis. Aortic Valve: The aortic valve is tricuspid. Aortic valve regurgitation is not visualized. Mild to moderate aortic valve sclerosis/calcification is present, without any evidence of aortic stenosis. Pulmonic Valve: The pulmonic valve was normal in structure. Pulmonic valve regurgitation is trivial. No evidence of pulmonic stenosis. Aorta: The aortic root is normal in size and structure. Venous: The inferior vena cava is normal in size with greater than 50% respiratory  variability, suggesting right atrial pressure of 3 mmHg. IAS/Shunts: No atrial level shunt detected by color flow Doppler. Additional Comments: Mild global reduction in LV systolic function; grade 1 diastolic dysfunction; mild MR; mild LAE.  LEFT VENTRICLE PLAX 2D LVIDd:         4.30 cm  Diastology LVIDs:         2.80 cm  LV e' lateral:   5.61 cm/s LV PW:         1.30 cm  LV E/e' lateral: 25.8 LV IVS:        1.00 cm  LV e' medial:    5.52 cm/s LVOT diam:     1.90 cm  LV E/e' medial:  26.3 LV SV:         56 LV SV Index:   31 LVOT Area:     2.84 cm  RIGHT VENTRICLE            IVC RV Basal diam:  2.80 cm    IVC diam: 2.00 cm RV S prime:     9.46 cm/s TAPSE (M-mode): 1.6 cm LEFT ATRIUM             Index       RIGHT ATRIUM          Index LA diam:        4.80 cm 2.62 cm/m  RA Area:     9.12 cm LA Vol (A2C):   71.1 ml 38.86 ml/m RA Volume:   19.60 ml 10.71 ml/m LA Vol (A4C):   47.5 ml 25.96 ml/m LA Biplane Vol: 58.5 ml 31.97 ml/m  AORTIC VALVE LVOT Vmax:   105.00 cm/s LVOT Vmean:  71.300 cm/s LVOT VTI:    0.198 m  AORTA Ao Root diam: 2.90 cm Ao Asc diam:  3.10 cm MITRAL VALVE                TRICUSPID VALVE MV Area (PHT): 2.99 cm     TR Peak grad:   33.6 mmHg MV Decel Time: 254 msec     TR Vmax:        290.00 cm/s MV E velocity: 145.00 cm/s MV A velocity: 136.00 cm/s  SHUNTS MV E/A ratio:  1.07         Systemic VTI:  0.20 m                             Systemic Diam: 1.90 cm Kirk Ruths MD Electronically signed by Kirk Ruths MD Signature Date/Time: 11/06/2019/4:52:49 PM    Final         Scheduled Meds: . atorvastatin  20 mg Oral Daily  . azithromycin  500 mg Oral Daily  . escitalopram  5 mg Oral Daily  . [START ON 11/09/2019] ferrous sulfate  325 mg Oral BID WC  . furosemide  20 mg Oral Daily  . gabapentin  400 mg Oral TID  . guaiFENesin  1,200 mg Oral BID  . insulin aspart  0-9 Units Subcutaneous TID WC  . metoprolol tartrate  25 mg Oral BID  . pantoprazole  40 mg Oral BID  . ropinirole  5 mg  Oral QHS  . senna-docusate  2 tablet Oral QHS  . sodium chloride flush  3 mL Intravenous Q12H  . sodium chloride flush  3 mL Intravenous Q12H  . sodium chloride flush  3 mL Intravenous Q12H   Continuous Infusions: . sodium chloride Stopped (11/06/19 2000)  . cefTRIAXone (ROCEPHIN)  IV 2 g (11/08/19 1123)  . ferumoxytol       LOS: 3 days   Time spent= 35 mins    Jaci Desanto Arsenio Loader, MD [facility]  If 7PM-7AM, please contact night-coverage  11/08/2019, 12:15 PM

## 2019-11-08 NOTE — Evaluation (Signed)
Occupational Therapy Evaluation Patient Details Name: Chloe Harding MRN: [id number] DOB: Sep 23, 1945 Today's Date: 11/08/2019    History of Present Illness Chloe Harding is a 74 y.o. female with medical history significant for hypertension, type 2 diabetes mellitus, and arthritis, now presenting to the emergency department with shortness of breath and cough.  Patient reports that she developed shortness of breath and cough, as well as bilateral lower extremity swelling approximately 2 weeks ago, was noted to have more swelling on the left than right and reports undergoing venous Dopplers of the lower extremities that were negative for DVT.  She was also started on Lasix 20 mg daily in the past couple weeks as well as amoxicillin for suspected pneumonia.  Despite this, patient reports continued worsening in her dyspnea and cough.   Clinical Impression   Patient demonstrates ability to ambulate in room, perform toileting and toilet transfer, grooming with standing and lower body dressing. Patient demonstrates functional strength and activity tolerance. Patient's o2 sats WFL but HR elevated into 120s with limited activity. MD in room and aware. Patient reports no symptoms and reports feeling at her baseline and eager to go home. No OT needs at this time.     Follow Up Recommendations  No OT follow up    Equipment Recommendations  None recommended by OT    Recommendations for Other Services       Precautions / Restrictions Precautions Precautions: None Restrictions Weight Bearing Restrictions: No      Mobility Bed Mobility Overal bed mobility: Modified Independent             General bed mobility comments: Ambulated in room, performed toilet transfer without assistance or device. Uses arms to push herself into standing and used grab bar in bathroom. Reports she is at baseline. O2 sats WFL but HR elevated with minimal activity to 120s. MD in room and aware.  Transfers Overall  transfer level: Modified independent               General transfer comment: Used one hand hold at times for balance but otherwise functional mobility.    Balance Overall balance assessment: Mild deficits observed, not formally tested                                         ADL either performed or assessed with clinical judgement   ADL Overall ADL's : Modified independent                                       General ADL Comments: Patient demonstrated ability to perform toileting, grooming and donning lower body clothing without assistance. use of one hand hold at times.     Vision Baseline Vision/History: No visual deficits Vision Assessment?: No apparent visual deficits     Perception     Praxis      Pertinent Vitals/Pain Pain Assessment: No/denies pain     Hand Dominance Right   Extremity/Trunk Assessment Upper Extremity Assessment Upper Extremity Assessment: Overall WFL for tasks assessed   Lower Extremity Assessment Lower Extremity Assessment: Defer to PT evaluation   Cervical / Trunk Assessment Cervical / Trunk Assessment: Normal   Communication Communication Communication: No difficulties   Cognition Arousal/Alertness: Awake/alert Behavior During Therapy: WFL for tasks assessed/performed Overall Cognitive Status: Within Functional Limits  for tasks assessed                                     General Comments       Exercises     Shoulder Instructions      Home Living Family/patient expects to be discharged to:: Private residence Living Arrangements: Spouse/significant other Available Help at Discharge: Available PRN/intermittently Type of Home: House Home Access: Stairs to enter [employer] of Steps: 10 in front, ramp in the back   Home Layout: One level     Bathroom Shower/Tub: Occupational psychologist: Handicapped height Bathroom Accessibility: Yes How Accessible:  Accessible via walker Home Equipment: [place] - 2 wheels;Cane - single point;Toilet riser          Prior Functioning/Environment Level of Independence: Independent        Comments: uses a cane PRN        OT Problem List:        OT Treatment/Interventions:      OT Goals(Current goals can be found in the care plan section) Acute Rehab OT Goals OT Goal Formulation: All assessment and education complete, DC therapy  OT Frequency:     Barriers to D/C:            Co-evaluation              AM-PAC OT "6 Clicks" Daily Activity     Outcome Measure Help from another person eating meals?: None Help from another person taking care of personal grooming?: None Help from another person toileting, which includes using toliet, bedpan, or urinal?: None Help from another person bathing (including washing, rinsing, drying)?: None Help from another person to put on and taking off regular upper body clothing?: None Help from another person to put on and taking off regular lower body clothing?: None 6 Click Score: 24   End of Session Nurse Communication:  (No OT needs, no chair alarm due to patient being alert and oriented.)  Activity Tolerance: Patient tolerated treatment well Patient left: in chair;with call bell/phone within reach  OT Visit Diagnosis: Muscle weakness (generalized) (M62.81)                Time: 5366-4403 OT Time Calculation (min): 17 min Charges:  OT General Charges $OT Visit: 1 Visit OT Evaluation $OT Eval Low Complexity: 1 Low  Chalmers Iddings, OTR/L [place]  Office [phone] Pager: [place] 11/08/2019, 10:56 AM

## 2019-11-09 DIAGNOSIS — E7800 Pure hypercholesterolemia, unspecified: Secondary | ICD-10-CM

## 2019-11-09 DIAGNOSIS — I2510 Atherosclerotic heart disease of native coronary artery without angina pectoris: Secondary | ICD-10-CM

## 2019-11-09 DIAGNOSIS — I5021 Acute systolic (congestive) heart failure: Secondary | ICD-10-CM

## 2019-11-09 LAB — BASIC METABOLIC PANEL
Anion gap: 9 (ref 5–15)
BUN: 8 mg/dL (ref 8–23)
CO2: 25 mmol/L (ref 22–32)
Calcium: 8.4 mg/dL — ABNORMAL LOW (ref 8.9–10.3)
Chloride: 100 mmol/L (ref 98–111)
Creatinine, Ser: 0.93 mg/dL (ref 0.44–1.00)
GFR calc Af Amer: >60 mL/min (ref >60)
GFR calc non Af Amer: >60 mL/min (ref >60)
Glucose, Bld: 109 mg/dL — ABNORMAL HIGH (ref 70–99)
Potassium: 4 mmol/L (ref 3.5–5.1)
Sodium: 134 mmol/L — ABNORMAL LOW (ref 135–145)

## 2019-11-09 LAB — CBC
HCT: 28 % — ABNORMAL LOW (ref 36.0–46.0)
Hemoglobin: 8.7 g/dL — ABNORMAL LOW (ref 12.0–15.0)
MCH: 25.8 pg — ABNORMAL LOW (ref 26.0–34.0)
MCHC: 31.1 g/dL (ref 30.0–36.0)
MCV: 83.1 fL (ref 80.0–100.0)
Platelets: 247 10*3/uL (ref 150–400)
RBC: 3.37 MIL/uL — ABNORMAL LOW (ref 3.87–5.11)
RDW: 15 % (ref 11.5–15.5)
WBC: 5.1 10*3/uL (ref 4.0–10.5)
nRBC: 0 % (ref 0.0–0.2)

## 2019-11-09 LAB — GLUCOSE, CAPILLARY
Glucose-Capillary: 84 mg/dL (ref 70–99)
Glucose-Capillary: 108 mg/dL — ABNORMAL HIGH (ref 70–99)

## 2019-11-09 LAB — MAGNESIUM: Magnesium: 1.7 mg/dL (ref 1.7–2.4)

## 2019-11-09 LAB — BRAIN NATRIURETIC PEPTIDE: B Natriuretic Peptide: 2255.4 pg/mL — ABNORMAL HIGH (ref 0.0–100.0)

## 2019-11-09 MED ORDER — BISACODYL 5 MG PO TBEC: 5.0000 mg | DELAYED_RELEASE_TABLET | Freq: Every day | ORAL | 0 refills | Status: DC | PRN

## 2019-11-09 MED ORDER — POLYETHYLENE GLYCOL 3350 17 G PO PACK: 17.0000 g | PACK | Freq: Every day | ORAL | 0 refills | Status: DC | PRN

## 2019-11-09 MED ORDER — FUROSEMIDE 40 MG PO TABS: 40.0000 mg | ORAL_TABLET | Freq: Every day | ORAL | Status: DC

## 2019-11-09 MED ORDER — FUROSEMIDE 40 MG PO TABS: 40.0000 mg | ORAL_TABLET | Freq: Every day | ORAL | 0 refills | Status: DC

## 2019-11-09 MED ORDER — HYDROCODONE-ACETAMINOPHEN 5-325 MG PO TABS: 1.0000 | ORAL_TABLET | Freq: Four times a day (QID) | ORAL | 0 refills | Status: DC | PRN

## 2019-11-09 MED ORDER — FERROUS SULFATE 325 (65 FE) MG PO TABS: 325.0000 mg | ORAL_TABLET | Freq: Two times a day (BID) | ORAL | 0 refills | Status: DC

## 2019-11-09 MED ORDER — FUROSEMIDE 10 MG/ML IJ SOLN
40.0000 mg | Freq: Once | INTRAMUSCULAR | Status: AC
  Administered 2019-11-09: 40 mg via INTRAVENOUS
  Filled 2019-11-09: qty 4

## 2019-11-09 MED ORDER — METOPROLOL TARTRATE 25 MG PO TABS: 25.0000 mg | ORAL_TABLET | Freq: Two times a day (BID) | ORAL | 0 refills | Status: DC

## 2019-11-09 MED ORDER — ATORVASTATIN CALCIUM 20 MG PO TABS: 20.0000 mg | ORAL_TABLET | Freq: Every day | ORAL | 0 refills | Status: DC

## 2019-11-09 MED ORDER — POTASSIUM CHLORIDE CRYS ER 20 MEQ PO TBCR
20.0000 meq | EXTENDED_RELEASE_TABLET | Freq: Every day | ORAL | Status: DC
  Administered 2019-11-09: 20 meq via ORAL
  Filled 2019-11-09: qty 1

## 2019-11-09 MED ORDER — SENNOSIDES-DOCUSATE SODIUM 8.6-50 MG PO TABS: 1.0000 | ORAL_TABLET | Freq: Every day | ORAL | 0 refills | Status: DC

## 2019-11-09 NOTE — Progress Notes (Signed)
Went over discharge instructions w/ patient. Pt verbalized understanding.

## 2019-11-09 NOTE — Progress Notes (Addendum)
Progress Note  Patient Name: Chloe Harding Date of Encounter: 11/09/2019  [facility] Cardiologist: [name], MD   Subjective   Feeling well.  Wants to go home.    Inpatient Medications    Scheduled Meds:  atorvastatin  20 mg Oral Daily   escitalopram  5 mg Oral Daily   ferrous sulfate  325 mg Oral BID WC   furosemide  20 mg Oral Daily   gabapentin  400 mg Oral TID   guaiFENesin  1,200 mg Oral BID   insulin aspart  0-9 Units Subcutaneous TID WC   metoprolol tartrate  25 mg Oral BID   pantoprazole  40 mg Oral BID   ropinirole  5 mg Oral QHS   senna-docusate  2 tablet Oral QHS   sodium chloride flush  3 mL Intravenous Q12H   sodium chloride flush  3 mL Intravenous Q12H   sodium chloride flush  3 mL Intravenous Q12H   Continuous Infusions:  sodium chloride Stopped (11/06/19 2000)   PRN Meds: sodium chloride, acetaminophen **OR** acetaminophen, albuterol, benzonatate, bisacodyl, HYDROcodone-acetaminophen, ondansetron **OR** ondansetron (ZOFRAN) IV, polyethylene glycol, senna-docusate, sodium chloride flush   Vital Signs    Vitals:   11/08/19 2356 11/09/19 0437 11/09/19 0500 11/09/19 0834  BP: 122/75 117/80  138/88  Pulse: 86 87  93  Resp: 18 18    Temp: 97.9 F (36.6 C) 98.8 F (37.1 C)    TempSrc: Oral Oral    SpO2: 96% 99%    Weight:   78.2 kg   Height:        Intake/Output Summary (Last 24 hours) at 11/09/2019 1017 Last data filed at 11/09/2019 0828 Gross per 24 hour  Intake 859 ml  Output --  Net 859 ml   Last 3 Weights 11/09/2019 11/08/2019 11/07/2019  Weight (lbs) 172 lb 6.4 oz 173 lb 4.5 oz 185 lb 10 oz  Weight (kg) 78.2 kg 78.6 kg 84.2 kg      Telemetry    Sinus rhythm.  No events - Personally Reviewed  ECG    n/a- Personally Reviewed  Physical Exam   VS:  BP 138/88    Pulse 93    Temp 98.8 F (37.1 C) (Oral)    Resp 18    Ht 5\' 1"  (1.549 m)    Wt 78.2 kg    SpO2 99%    BMI 32.57 kg/m  , BMI Body mass index is  32.57 kg/m. GENERAL:  Well appearing HEENT: Pupils equal round and reactive, fundi not visualized, oral mucosa unremarkable NECK:  No jugular venous distention, waveform within normal limits, carotid upstroke brisk and symmetric, no bruits LUNGS:  Mild crackles at L base HEART:  RRR.  PMI not displaced or sustained,S1 and S2 within normal limits, no S3, no S4, no clicks, no rubs, no murmurs ABD:  Flat, positive bowel sounds normal in frequency in pitch, no bruits, no rebound, no guarding, no midline pulsatile mass, no hepatomegaly, no splenomegaly EXT:  2 plus pulses throughout, no edema, no cyanosis no clubbing SKIN:  No rashes no nodules NEURO:  Cranial nerves II through XII grossly intact, motor grossly intact throughout PSYCH:  Cognitively intact, oriented to person place and time   Labs    High Sensitivity Troponin:   Recent Labs  Lab 11/05/19 1821 11/05/19 2111 11/06/19 0058 11/06/19 0659  TROPONINIHS 54* 345* 1,226* 1,787*      Chemistry Recent Labs  Lab 11/05/19 1821 11/06/19 0058 11/07/19 0328 11/08/19 0344  11/09/19 0337  NA 132*   < > 134* 134* 134*  K 4.4   < > 4.0 3.4* 4.0  CL 100   < > 102 102 100  CO2 21*   < > 24 23 25   GLUCOSE 149*   < > 97 116* 109*  BUN 13   < > 17 12 8   CREATININE 1.08*   < > 1.34* 0.95 0.93  CALCIUM 8.7*   < > 8.2* 8.8* 8.4*  PROT 6.6  --  5.9* 6.6  --   ALBUMIN 3.3*  --  2.8* 3.0*  --   AST 17  --  22 22  --   ALT 9  --  11 12  --   ALKPHOS 73  --  62 62  --   BILITOT 0.5  --  0.6 0.6  --   GFRNONAA 51*   < > 39* 60* >60  GFRAA 59*   < > 46* >60 >60  ANIONGAP 11   < > 8 9 9    < > = values in this interval not displayed.     Hematology Recent Labs  Lab 11/07/19 0328 11/08/19 0344 11/09/19 0337  WBC 6.3 5.8 5.1  RBC 2.57* 3.62* 3.37*  HGB 6.7* 9.3* 8.7*  HCT 22.1* 30.7* 28.0*  MCV 86.0 84.8 83.1  MCH 26.1 25.7* 25.8*  MCHC 30.3 30.3 31.1  RDW 14.3 15.4 15.0  PLT 211 237 247    BNP Recent Labs  Lab  11/05/19 1821 11/09/19 0337  BNP 719.2* 2,255.4*     DDimer No results for input(s): DDIMER in the last 168 hours.   Radiology    DG CHEST PORT 1 VIEW  Result Date: 11/07/2019 CLINICAL DATA:  74 year old female with shortness of breath. EXAM: PORTABLE CHEST 1 VIEW COMPARISON:  Chest radiograph dated 11/06/2019 and CT dated 11/05/2019. FINDINGS: Shallow inspiration with bibasilar atelectasis. Infiltrate is not excluded. Clinical correlation is recommended. Small bilateral pleural effusions. No pneumothorax. Stable cardiomediastinal silhouette. Atherosclerotic calcification of the aorta. No acute osseous pathology. IMPRESSION: 1. Shallow inspiration with bibasilar atelectasis. Infiltrate is not excluded. 2. Small bilateral pleural effusions. Electronically Signed   By: Anner Crete M.D.   On: 11/07/2019 16:38    Cardiac Studies   1. Mild global reduction in LV systolic function; grade 1 diastolic  dysfunction; mild MR; mild LAE.  2. Left ventricular ejection fraction, by estimation, is 45 to 50%. The  left ventricle has mildly decreased function. The left ventricle  demonstrates global hypokinesis. Left ventricular diastolic parameters are  consistent with Grade I diastolic  dysfunction (impaired relaxation). Elevated left atrial pressure.  3. Right ventricular systolic function is normal. The right ventricular  size is normal. There is mildly elevated pulmonary artery systolic  pressure.  4. Left atrial size was mildly dilated.  5. The mitral valve is normal in structure. Mild mitral valve  regurgitation. No evidence of mitral stenosis.  6. The aortic valve is tricuspid. Aortic valve regurgitation is not  visualized. Mild to moderate aortic valve sclerosis/calcification is  present, without any evidence of aortic stenosis.  7. The inferior vena cava is normal in size with greater than 50%  respiratory variability, suggesting right atrial pressure of 3 mmHg.   Patient  Profile     67F with diabetes, hypertension, and hyperlipidemia admitted with shortness of breath and fever and found to have pneumonia. Cardiology was consulted due to elevated troponin.   Assessment & Plan     #  Demand ischemia: High-sensitivity troponin was elevated to 1787.  Echo revealed LVEF 45 to 50% with global hypokinesis.  She had no chest pain and EKG showed no evidence of ischemia. Given her lack of ischemic symptoms and anemia, no plan for cath at this time. She will need an coronary assessment once more medically stable. Continue metoprolol.  She is not on aspirin due to anemia requiring blood transfusion.  Recommend maintaining hemoglobin greater than 8.  # Acute systolic and diastolic heart failure: LVEF this admission was 45 to 50% with global hypokinesis.  There is grade 1 diastolic dysfunction.  No significant valvular abnormalities.  No prior echo available.  Today her volume status seems good.  However BNP is elevated to 2255.  She is eager to go home.  She has no lower extremity edema and is able to lay flat.  We will give 1 dose of Lasix 40 mg IV and increase her home Lasix to 40 mg from 20 mg.  Limit fluid intake to 1.5 L and sodium to 2 g.  # Acute on chronic anemia:  H/h stable today. She received transfusion and IV iron this hospitalization.  She will see GI as an outpatient.  She has a history of esophageal stenosis requiring dilatation, hiatal hernia, and colonic polyps.  Consider stress versus cath once her source of anemia is determined.  #Hyperlipidemia: Continue atorvastatin.  # CAP: Admitted with pneumonia.   [facility] will sign off.   Medication Recommendations:  Give one dose of IV lasix.  Increase home lasix to 40mg .  Start potassium supplement. Other recommendations (labs, testing, etc): CBC and CMP within a week Follow up as an outpatient:  We will arrange within a couple weeks.  For questions or updates, please contact [place] Please  consult [email or web address] for contact info under        Signed, [name], MD  11/09/2019, 10:17 AM

## 2019-11-09 NOTE — Progress Notes (Signed)
Received scheduled dose of metoprolol. Pt remains NSR. Pt alert. No distress noted. Will monitor throughout the shift.    11/08/19 2356  Assess: MEWS Score  Temp 97.9 F (36.6 C)  BP 122/75  Pulse Rate 86  Resp 18  SpO2 96 %  O2 Device Room Air  Assess: MEWS Score  MEWS Temp 0  MEWS Systolic 0  MEWS Pulse 0  MEWS RR 0  MEWS LOC 0  MEWS Score 0  MEWS Score Color Green  Document  Patient Outcome Stabilized after interventions  Progress note created (see row info) Yes

## 2019-11-09 NOTE — Progress Notes (Signed)
Dr. Oval Linsey requested arranging f/u appt in few weeks in our clinic - made for 7/7 with NP, appt placed on AVS. Given her recommendation of labs in 1 week in note, recommend f/u with PCP within 1 week of hospital DC. Miya Luviano PA-C

## 2019-11-09 NOTE — Progress Notes (Signed)
Pt remains NSR.no distress noted. Pt up in bed. Awake. Continue with plan of care for beta blocker for HR control   11/08/19 1954  Assess: MEWS Score  Temp 98.7 F (37.1 C)  BP 119/66  Pulse Rate 96  Resp 14  SpO2 97 %  O2 Device Room Air  Assess: MEWS Score  MEWS Temp 0  MEWS Systolic 0  MEWS Pulse 0  MEWS RR 0  MEWS LOC 0  MEWS Score 0  MEWS Score Color Green  Document  Patient Outcome Stabilized after interventions  Progress note created (see row info) Yes

## 2019-11-09 NOTE — Discharge Summary (Signed)
Physician Discharge Summary  Chloe Harding UXN:[id number] DOB: 09-02-1945 DOA: 11/05/2019  PCP: [name], NP  Admit date: 11/05/2019 Discharge date: 11/09/2019  Admitted From: Home Disposition: Home  Recommendations for Outpatient Follow-up:  1. Follow up with PCP in 1-2 weeks 2. Please obtain BMP/CBC in one week your next doctors visit.  3. Change Lasix 20 mg daily to 40 mg daily 4. Continue potassium supplements 5. Iron supplements and pain medications prescribed 6. Bowel regimen prescribed 7. Hold lisinopril for now, resume slowly outpatient 8. Metoprolol twice daily prescribed for tachycardia  Discharge Condition: Stable CODE STATUS: Full Diet recommendation: Heart healthy  Brief/Interim Summary: 74 y.o.femalewith medical history significant forhypertension, type 2 diabetes mellitus, and arthritis, now presenting to the emergency department with shortness of breath and cough.  CTA negative for PE but concerning for pneumonia, atelectasis and fluid overload.  Recently completed outpatient amoxicillin, speech and swallow-recommend regular diet.  Upon admission started on Rocephin and IV diuretics.  Received 1 unit PRBC transfusion, work-up for GI bleed was negative.  Patient did well with bronchodilators, IV antibiotics and diuretics.  Her breathing symptoms slowly improved over the course of 3 days.  She was diagnosed with iron deficiency anemia and was prescribed iron supplements with bowel regimen.  Work with PT OT, did not require any outpatient follow-up.  Patient stable for discharge   Assessment & Plan:   Principal Problem:   Pneumonia Active Problems:   Type 2 diabetes mellitus (HCC)   ANEMIA   Mild renal insufficiency   Hypertension   Pure hypercholesterolemia   Hyponatremia   Acute CHF (congestive heart failure) (HCC)   SOB (shortness of breath)   Demand ischemia (HCC)   CAD in native artery   Acute hypoxic respiratory failure, 2 L nasal cannula.   Multifactorial Acute congestive heart failure with reduced ejection fraction, 45%.  Grade 1 DD, class II Community-acquired pneumonia, right lower lobe Symptomatically feeling much better, no complaints.  Echo shows EF 25-42%, grade 1 diastolic dysfunction.  Discontinue antibiotics, continue home bronchodilator use.  Lasix has been transitioned to 40 mg daily at home with potassium supplements.  Will need lab work in 1 week  Elevated troponin secondary to demand ischemia -Seen by cardiology.  Supportive care.  Eventually will need coronary assessment once stable  Iron deficiency anemia Status post 1 dose of IV iron in the hospital, will be discharged on p.o. iron with bowel regimen at home Outpatient follow-up with GI.   EGD 9/20-esophageal stenosis status post dilatation, hiatal hernia.   C-scope January 2019-3 mm polyp and diverticulosis.  Mild renal insufficiency/AKI, worsening Metabolic acidosis,improved Prerenal, currently resolved  Hypertensionwith sinus tachycardia, sinus tachycardia resolved Lisinopril on hold which can be resumed outpatient.  Doing well with metoprolol 25 mg twice daily  Type II DM Peripheral neuropathy Hemoglobin A1c 5.6.  Accu-Chek and sliding scale Continue home gabapentin  Obesity -Estimated body mass index is 35.07 kg/m   GERD -C/w PPI with Pantoprazole 40 mg po BID   Depression -C/w Escitalopram 5 mg po Daily  HLD -C/w Atorvastatin 20 mg po Daily    Discharge Diagnoses:  Principal Problem:   Pneumonia Active Problems:   Type 2 diabetes mellitus (HCC)   ANEMIA   Mild renal insufficiency   Hypertension   Pure hypercholesterolemia   Hyponatremia   Acute CHF (congestive heart failure) (HCC)   SOB (shortness of breath)   Demand ischemia ([place])   CAD in native artery    Consultations:  Cardiology  Subjective: Feels great wants to go home.  Discharge Exam: Vitals:   11/09/19 0437 11/09/19 0834  BP: 117/80 138/88   Pulse: 87 93  Resp: 18   Temp: 98.8 F (37.1 C)   SpO2: 99%    Vitals:   11/08/19 2356 11/09/19 0437 11/09/19 0500 11/09/19 0834  BP: 122/75 117/80  138/88  Pulse: 86 87  93  Resp: 18 18    Temp: 97.9 F (36.6 C) 98.8 F (37.1 C)    TempSrc: Oral Oral    SpO2: 96% 99%    Weight:   78.2 kg   Height:        General: Pt is alert, awake, not in acute distress Cardiovascular: RRR, S1/S2 +, no rubs, no gallops Respiratory: CTA bilaterally, no wheezing, no rhonchi Abdominal: Soft, NT, ND, bowel sounds + Extremities: no edema, no cyanosis  Discharge Instructions   Allergies as of 11/09/2019      Reactions   Amlodipine Besylate Swelling   swelling      Medication List    STOP taking these medications   lisinopril 2.5 MG tablet Commonly known as: ZESTRIL     TAKE these medications   amitriptyline 25 MG tablet Commonly known as: ELAVIL Take 25 mg by mouth at bedtime.   atorvastatin 20 MG tablet Commonly known as: LIPITOR Take 1 tablet (20 mg total) by mouth daily. Start taking on: November 10, 2019   benzonatate 100 MG capsule Commonly known as: TESSALON Take 100 mg by mouth 4 (four) times daily as needed for cough.   bisacodyl 5 MG EC tablet Commonly known as: DULCOLAX Take 1 tablet (5 mg total) by mouth daily as needed for moderate constipation.   Constulose 10 GM/15ML solution Generic drug: lactulose Take 10 g by mouth daily as needed for mild constipation.   escitalopram 5 MG tablet Commonly known as: LEXAPRO Take 5 mg by mouth daily.   ferrous sulfate 325 (65 FE) MG tablet Take 1 tablet (325 mg total) by mouth 2 (two) times daily with a meal.   folic acid 1 MG tablet Commonly known as: FOLVITE Take 1 mg by mouth daily.   furosemide 40 MG tablet Commonly known as: LASIX Take 1 tablet (40 mg total) by mouth daily. Start taking on: November 10, 2019 What changed:   medication strength  how much to take  when to take this  reasons to take this    gabapentin 400 MG capsule Commonly known as: NEURONTIN Take 400 mg by mouth 3 (three) times daily.   HYDROcodone-acetaminophen 5-325 MG tablet Commonly known as: NORCO/VICODIN Take 1 tablet by mouth every 6 (six) hours as needed for moderate pain.   hydroxychloroquine 200 MG tablet Commonly known as: PLAQUENIL Take 200 mg by mouth daily.   Januvia 100 MG tablet Generic drug: sitaGLIPtin Take 100 mg by mouth daily.   metoprolol tartrate 25 MG tablet Commonly known as: LOPRESSOR Take 1 tablet (25 mg total) by mouth 2 (two) times daily.   pantoprazole 40 MG tablet Commonly known as: Protonix Take 1 tablet (40 mg total) by mouth 2 (two) times daily.   polyethylene glycol 17 g packet Commonly known as: MIRALAX / GLYCOLAX Take 17 g by mouth daily as needed for moderate constipation or severe constipation.   potassium chloride 10 MEQ tablet Commonly known as: KLOR-CON Take 10 mEq by mouth daily.   ropinirole 5 MG tablet Commonly known as: REQUIP Take 5 mg by mouth at bedtime.   senna-docusate 8.6-50  MG tablet Commonly known as: Senokot-S Take 1 tablet by mouth at bedtime.       Follow-up Information    Marilynn Rail Jossie Ng, NP Follow up.   Specialty: Cardiology Why: [facility] - an appointment has been made for you on Wednesday November 22, 2019 at 10:45 AM (Arrive by 10:30 AM). Coletta Memos is one of the nurse practitioners that works closely with Dr. Oval Linsey and our cardiology team. Contact information: [address] [place] [address] Fajardo, Elizabeth, NP. Schedule an appointment as soon as possible for a visit in 1 week(s).   Specialty: Nurse Practitioner Contact information: [place] [address] [phone]        [name], MD .   Specialty: Cardiology Contact information: [address] [place] [address] [phone]              Allergies  Allergen Reactions  .  Amlodipine Besylate Swelling    swelling    You were cared for by a hospitalist during your hospital stay. If you have any questions about your discharge medications or the care you received while you were in the hospital after you are discharged, you can call the unit and asked to speak with the hospitalist on call if the hospitalist that took care of you is not available. Once you are discharged, your primary care physician will handle any further medical issues. Please note that no refills for any discharge medications will be authorized once you are discharged, as it is imperative that you return to your primary care physician (or establish a relationship with a primary care physician if you do not have one) for your aftercare needs so that they can reassess your need for medications and monitor your lab values.   Procedures/Studies: CT Angio Chest PE W/Cm &/Or Wo Cm  Result Date: 11/05/2019 CLINICAL DATA:  Chest pain and shortness of breath. Recent diagnosis of pneumonia. EXAM: CT ANGIOGRAPHY CHEST WITH CONTRAST TECHNIQUE: Multidetector CT imaging of the chest was performed using the standard protocol during bolus administration of intravenous contrast. Multiplanar CT image reconstructions and MIPs were obtained to evaluate the vascular anatomy. CONTRAST:  97mL OMNIPAQUE IOHEXOL 350 MG/ML SOLN COMPARISON:  Radiograph earlier this day.  Chest CT 01/15/2019 FINDINGS: Cardiovascular: There are no filling defects within the pulmonary arteries to suggest pulmonary embolus. Moderate aortic atherosclerosis. Cannot assess for dissection given phase of contrast tailored to pulmonary artery evaluation. Mitral annulus calcifications. Coronary artery calcifications. Heart is upper normal in size. Mediastinum/Nodes: Prominent subcarinal node measures 14 mm, series 4, image 54. There are shotty bilateral hilar nodes measuring 10 mm on the left and 8 mm on the right. Hypodense 2.3 cm right thyroid nodule. Patulous  esophagus with small to moderate hiatal hernia. Lungs/Pleura: Elevated right hemidiaphragm with compressive atelectasis in the right lower and middle lobes. Small bilateral pleural effusions. Patchy ground-glass opacities in the superior segment of the right lower lobe and throughout the right upper lobe suspicious for pneumonia. There is central bronchial thickening with areas of mucous plugging on the left. Questionable mild pulmonary edema with smooth septal thickening. Upper Abdomen: Small to moderate hiatal hernia. Cholecystectomy. No acute or unexpected findings. Musculoskeletal: Diffuse degenerative change in the spine. There are no acute or suspicious osseous abnormalities. Review of the MIP images confirms the above findings. IMPRESSION: 1. No pulmonary embolus. 2. Patchy ground-glass opacities in the superior segment of the right lower  lobe and throughout the right upper lobe suspicious for pneumonia. 3. Elevated right hemidiaphragm with compressive atelectasis in the right middle and lower lobes. 4. Small bilateral pleural effusions. Questionable mild pulmonary edema with smooth septal thickening. 5. Central bronchial thickening with areas of mucous plugging in the left lower lobe. 6. Small to moderate hiatal hernia. 7. Right thyroid nodule measuring 2.3 cm. This was evaluated on thyroid imaging 01/15/2019, please reference that diagnostic ultrasound for further recommendations. Aortic Atherosclerosis (ICD10-I70.0). Electronically Signed   By: Keith Rake M.D.   On: 11/05/2019 20:41   DG CHEST PORT 1 VIEW  Result Date: 11/07/2019 CLINICAL DATA:  74 year old female with shortness of breath. EXAM: PORTABLE CHEST 1 VIEW COMPARISON:  Chest radiograph dated 11/06/2019 and CT dated 11/05/2019. FINDINGS: Shallow inspiration with bibasilar atelectasis. Infiltrate is not excluded. Clinical correlation is recommended. Small bilateral pleural effusions. No pneumothorax. Stable cardiomediastinal silhouette.  Atherosclerotic calcification of the aorta. No acute osseous pathology. IMPRESSION: 1. Shallow inspiration with bibasilar atelectasis. Infiltrate is not excluded. 2. Small bilateral pleural effusions. Electronically Signed   By: Anner Crete M.D.   On: 11/07/2019 16:38   DG CHEST PORT 1 VIEW  Result Date: 11/06/2019 CLINICAL DATA:  Shortness of breath. EXAM: PORTABLE CHEST 1 VIEW COMPARISON:  November 05, 2019. FINDINGS: Stable cardiomediastinal silhouette. Atherosclerosis of thoracic aorta is noted. Hypoinflation of the lungs is noted with mild bibasilar subsegmental atelectasis. No pneumothorax is noted. Small pleural effusions cannot be excluded. Bony thorax is unremarkable. IMPRESSION: Hypoinflation of the lungs with mild bibasilar subsegmental atelectasis. Small pleural effusions cannot be excluded. Electronically Signed   By: Marijo Conception M.D.   On: 11/06/2019 14:31   DG Chest Port 1 View  Result Date: 11/05/2019 CLINICAL DATA:  Fever, shortness of breath EXAM: PORTABLE CHEST 1 VIEW COMPARISON:  10/02/2017 FINDINGS: Low lung volumes. Chronic elevation of the right hemidiaphragm with bibasilar atelectasis. Possible small left pleural effusion. No pneumothorax. Atherosclerotic calcification of the aortic knob. IMPRESSION: Low lung volumes with bibasilar atelectasis. Possible small left pleural effusion. Electronically Signed   By: Davina Poke D.O.   On: 11/05/2019 18:45   ECHOCARDIOGRAM COMPLETE  Result Date: 11/06/2019    ECHOCARDIOGRAM REPORT   Patient Name:   Chloe Harding Date of Exam: 11/06/2019 Medical Rec #:  [id number]      Height:       61.0 in Accession #:    [id number]     Weight:       185.6 lb Date of Birth:  [birth year]     BSA:          1.830 m Patient Age:    66 years       BP:           114/77 mmHg Patient Gender: F              HR:           106 bpm. Exam Location:  Inpatient Procedure: 2D Echo, Cardiac Doppler and Color Doppler Indications:    I50.33 Acute on chronic  diastolic (congestive) heart failure  History:        Patient has no prior history of Echocardiogram examinations.                 Risk Factors:Hypertension, Diabetes and Dyslipidemia. Anemia.  Sonographer:    Jonelle Sidle Dance Referring Phys: [id number] [place]  1. Mild global reduction in LV systolic function; grade 1 diastolic dysfunction; mild MR; mild LAE.  2. Left ventricular ejection fraction, by estimation, is 45 to 50%. The left ventricle has mildly decreased function. The left ventricle demonstrates global hypokinesis. Left ventricular diastolic parameters are consistent with Grade I diastolic dysfunction (impaired relaxation). Elevated left atrial pressure.  3. Right ventricular systolic function is normal. The right ventricular size is normal. There is mildly elevated pulmonary artery systolic pressure.  4. Left atrial size was mildly dilated.  5. The mitral valve is normal in structure. Mild mitral valve regurgitation. No evidence of mitral stenosis.  6. The aortic valve is tricuspid. Aortic valve regurgitation is not visualized. Mild to moderate aortic valve sclerosis/calcification is present, without any evidence of aortic stenosis.  7. The inferior vena cava is normal in size with greater than 50% respiratory variability, suggesting right atrial pressure of 3 mmHg. FINDINGS  Left Ventricle: Left ventricular ejection fraction, by estimation, is 45 to 50%. The left ventricle has mildly decreased function. The left ventricle demonstrates global hypokinesis. The left ventricular internal cavity size was normal in size. There is  no left ventricular hypertrophy. Left ventricular diastolic parameters are consistent with Grade I diastolic dysfunction (impaired relaxation). Elevated left atrial pressure. Right Ventricle: The right ventricular size is normal. Right ventricular systolic function is normal. There is mildly elevated pulmonary artery systolic pressure. The tricuspid regurgitant  velocity is 2.90 m/s, and with an assumed right atrial pressure of 3 mmHg, the estimated right ventricular systolic pressure is 99.3 mmHg. Left Atrium: Left atrial size was mildly dilated. Right Atrium: Right atrial size was normal in size. Pericardium: There is no evidence of pericardial effusion. Mitral Valve: The mitral valve is normal in structure. There is moderate calcification of the mitral valve leaflet(s). Normal mobility of the mitral valve leaflets. Mild mitral valve regurgitation. No evidence of mitral valve stenosis. Tricuspid Valve: The tricuspid valve is normal in structure. Tricuspid valve regurgitation is mild . No evidence of tricuspid stenosis. Aortic Valve: The aortic valve is tricuspid. Aortic valve regurgitation is not visualized. Mild to moderate aortic valve sclerosis/calcification is present, without any evidence of aortic stenosis. Pulmonic Valve: The pulmonic valve was normal in structure. Pulmonic valve regurgitation is trivial. No evidence of pulmonic stenosis. Aorta: The aortic root is normal in size and structure. Venous: The inferior vena cava is normal in size with greater than 50% respiratory variability, suggesting right atrial pressure of 3 mmHg. IAS/Shunts: No atrial level shunt detected by color flow Doppler. Additional Comments: Mild global reduction in LV systolic function; grade 1 diastolic dysfunction; mild MR; mild LAE.  LEFT VENTRICLE PLAX 2D LVIDd:         4.30 cm  Diastology LVIDs:         2.80 cm  LV e' lateral:   5.61 cm/s LV PW:         1.30 cm  LV E/e' lateral: 25.8 LV IVS:        1.00 cm  LV e' medial:    5.52 cm/s LVOT diam:     1.90 cm  LV E/e' medial:  26.3 LV SV:         56 LV SV Index:   31 LVOT Area:     2.84 cm  RIGHT VENTRICLE            IVC RV Basal diam:  2.80 cm    IVC diam: 2.00 cm RV S prime:     9.46 cm/s TAPSE (M-mode): 1.6 cm LEFT ATRIUM  Index       RIGHT ATRIUM          Index LA diam:        4.80 cm 2.62 cm/m  RA Area:     9.12 cm LA  Vol (A2C):   71.1 ml 38.86 ml/m RA Volume:   19.60 ml 10.71 ml/m LA Vol (A4C):   47.5 ml 25.96 ml/m LA Biplane Vol: 58.5 ml 31.97 ml/m  AORTIC VALVE LVOT Vmax:   105.00 cm/s LVOT Vmean:  71.300 cm/s LVOT VTI:    0.198 m  AORTA Ao Root diam: 2.90 cm Ao Asc diam:  3.10 cm MITRAL VALVE                TRICUSPID VALVE MV Area (PHT): 2.99 cm     TR Peak grad:   33.6 mmHg MV Decel Time: 254 msec     TR Vmax:        290.00 cm/s MV E velocity: 145.00 cm/s MV A velocity: 136.00 cm/s  SHUNTS MV E/A ratio:  1.07         Systemic VTI:  0.20 m                             Systemic Diam: 1.90 cm Kirk Ruths MD Electronically signed by Kirk Ruths MD Signature Date/Time: 11/06/2019/4:52:49 PM    Final       The results of significant diagnostics from this hospitalization (including imaging, microbiology, ancillary and laboratory) are listed below for reference.     Microbiology: Recent Results (from the past 240 hour(s))  Blood Culture (routine x 2)     Status: None (Preliminary result)   Collection Time: 11/05/19  6:21 PM   Specimen: BLOOD  Result Value Ref Range Status   Specimen Description   Final    BLOOD RIGHT ANTECUBITAL Performed at [facility] [address] [place], [place] [address]    Special Requests   Final    BOTTLES DRAWN AEROBIC AND ANAEROBIC Blood Culture adequate volume Performed at [facility] [address] [place], [place] [address]    Culture   Final    NO GROWTH 4 DAYS Performed at [facility], [place] [address], [place],  [address]    Report Status PENDING  Incomplete  SARS Coronavirus 2 by RT PCR (hospital order, performed in [facility] hospital lab) Nasopharyngeal Nasopharyngeal Swab     Status: None   Collection Time: 11/05/19  6:21 PM   Specimen: Nasopharyngeal Swab  Result Value Ref Range Status   SARS Coronavirus 2 NEGATIVE NEGATIVE Final    Comment: (NOTE) SARS-CoV-2 target nucleic acids are NOT  DETECTED.  The SARS-CoV-2 RNA is generally detectable in upper and lower respiratory specimens during the acute phase of infection. The lowest concentration of SARS-CoV-2 viral copies this assay can detect is 250 copies / mL. A negative result does not preclude SARS-CoV-2 infection and should not be used as the sole basis for treatment or other patient management decisions.  A negative result may occur with improper specimen collection / handling, submission of specimen other than nasopharyngeal swab, presence of viral mutation(s) within the areas targeted by this assay, and inadequate number of viral copies (<250 copies / mL). A negative result must be combined with clinical observations, patient history, and epidemiological information.  Fact Sheet for Patients:   StrictlyIdeas.no  Fact Sheet for Healthcare Providers: BankingDealers.co.za  This  test is not yet approved or  cleared by the [place] and has been authorized for detection and/or diagnosis of SARS-CoV-2 by FDA under an Emergency Use Authorization (EUA).  This EUA will remain in effect (meaning this test can be used) for the duration of the COVID-19 declaration under Section 564(b)(1) of the Act, 21 U.S.C. section 360bbb-3(b)(1), unless the authorization is terminated or revoked sooner.  Performed at [facility], [place] [address], [place], [place] [address]   Blood Culture (routine x 2)     Status: None (Preliminary result)   Collection Time: 11/05/19  6:41 PM   Specimen: BLOOD LEFT FOREARM  Result Value Ref Range Status   Specimen Description   Final    BLOOD LEFT FOREARM Performed at [facility] [address], [place], [place] [address]    Special Requests   Final    BOTTLES DRAWN AEROBIC ONLY Blood Culture results may not be optimal due to an inadequate volume of blood received in culture bottles Performed at [facility] [address] [place], [place] [address]    Culture   Final    NO GROWTH 4 DAYS Performed at [facility], [place] [address], [place], [place] [address]    Report Status PENDING  Incomplete  Urine culture     Status: None   Collection Time: 11/06/19  1:00 AM   Specimen: In/Out Cath Urine  Result Value Ref Range Status   Specimen Description   Final    IN/OUT CATH URINE Performed at [facility] [address] [place], [place] [address]    Special Requests   Final    NONE Performed at [facility], [place] [address] [place], [place] [address]    Culture   Final    NO GROWTH Performed at [facility], [place] [address], [place], [place] [address]    Report Status 11/07/2019 FINAL  Final     Labs: BNP (last 3 results) Recent Labs    11/05/19 1821 11/09/19 0337  BNP 719.2* 0,998.3*   Basic Metabolic Panel: Recent Labs  Lab 11/05/19 1821 11/06/19 0058 11/07/19 0328 11/08/19 0344 11/09/19 0337  NA 132* 134* 134* 134* 134*  K 4.4 4.4 4.0 3.4* 4.0  CL 100 100 102 102 100  CO2 21* 24 24 23 25   GLUCOSE 149* 130* 97 116* 109*  BUN 13 14 17 12 8   CREATININE 1.08* 1.18* 1.34* 0.95 0.93  CALCIUM 8.7* 8.6* 8.2* 8.8* 8.4*  MG  --  1.9 2.0 1.8 1.7  PHOS  --   --  3.6 3.1  --    Liver Function Tests: Recent Labs  Lab 11/05/19 1821 11/07/19 0328 11/08/19 0344  AST 17 22 22   ALT 9 11 12   ALKPHOS 73 62 62  BILITOT 0.5 0.6 0.6  PROT 6.6 5.9* 6.6  ALBUMIN 3.3* 2.8* 3.0*   No results for input(s): LIPASE, AMYLASE in the last 168 hours. No results for input(s): AMMONIA in the last 168 hours. CBC: Recent Labs  Lab 11/05/19 1821 11/06/19 0058 11/07/19 0328 11/08/19 0344 11/09/19 0337  WBC 10.8* 7.8 6.3 5.8 5.1  NEUTROABS 9.2*  --  3.9 3.2  --   HGB 8.1* 7.1* 6.7* 9.3* 8.7*  HCT 25.8* 22.9* 22.1* 30.7* 28.0*  MCV 86.6 86.4 86.0 84.8 83.1  PLT 296 228 211 237 247   Cardiac Enzymes: No  results for input(s): CKTOTAL, CKMB, CKMBINDEX,  TROPONINI in the last 168 hours. BNP: Invalid input(s): POCBNP CBG: Recent Labs  Lab 11/08/19 1200 11/08/19 1705 11/08/19 2152 11/09/19 0748 11/09/19 1143  GLUCAP 104* 119* 126* 84 108*   D-Dimer No results for input(s): DDIMER in the last 72 hours. Hgb A1c No results for input(s): HGBA1C in the last 72 hours. Lipid Profile No results for input(s): CHOL, HDL, LDLCALC, TRIG, CHOLHDL, LDLDIRECT in the last 72 hours. Thyroid function studies No results for input(s): TSH, T4TOTAL, T3FREE, THYROIDAB in the last 72 hours.  Invalid input(s): FREET3 Anemia work up No results for input(s): VITAMINB12, FOLATE, FERRITIN, TIBC, IRON, RETICCTPCT in the last 72 hours. Urinalysis    Component Value Date/Time   COLORURINE STRAW (A) 11/06/2019 0100   APPEARANCEUR CLEAR 11/06/2019 0100   LABSPEC 1.011 11/06/2019 0100   PHURINE 6.0 11/06/2019 0100   GLUCOSEU NEGATIVE 11/06/2019 0100   HGBUR NEGATIVE 11/06/2019 0100   HGBUR trace-intact 05/14/2010 1402   BILIRUBINUR NEGATIVE 11/06/2019 0100   KETONESUR NEGATIVE 11/06/2019 0100   PROTEINUR NEGATIVE 11/06/2019 0100   UROBILINOGEN 1.0 03/30/2015 1540   NITRITE NEGATIVE 11/06/2019 0100   LEUKOCYTESUR NEGATIVE 11/06/2019 0100   Sepsis Labs Invalid input(s): PROCALCITONIN,  WBC,  LACTICIDVEN Microbiology Recent Results (from the past 240 hour(s))  Blood Culture (routine x 2)     Status: None (Preliminary result)   Collection Time: 11/05/19  6:21 PM   Specimen: BLOOD  Result Value Ref Range Status   Specimen Description   Final    BLOOD RIGHT ANTECUBITAL Performed at [facility], [place] [address] [place], [place] [address]    Special Requests   Final    BOTTLES DRAWN AEROBIC AND ANAEROBIC Blood Culture adequate volume Performed at [place] [address] [place], [place] [address]    Culture   Final    NO GROWTH 4 DAYS Performed at [facility], [place] [address] [place],  [address]    Report Status PENDING  Incomplete  SARS Coronavirus 2 by RT PCR (hospital order, performed in [facility] hospital lab) Nasopharyngeal Nasopharyngeal Swab     Status: None   Collection Time: 11/05/19  6:21 PM   Specimen: Nasopharyngeal Swab  Result Value Ref Range Status   SARS Coronavirus 2 NEGATIVE NEGATIVE Final    Comment: (NOTE) SARS-CoV-2 target nucleic acids are NOT DETECTED.  The SARS-CoV-2 RNA is generally detectable in upper and lower respiratory specimens during the acute phase of infection. The lowest concentration of SARS-CoV-2 viral copies this assay can detect is 250 copies / mL. A negative result does not preclude SARS-CoV-2 infection and should not be used as the sole basis for treatment or other patient management decisions.  A negative result may occur with improper specimen collection / handling, submission of specimen other than nasopharyngeal swab, presence of viral mutation(s) within the areas targeted by this assay, and inadequate number of viral copies (<250 copies / mL). A negative result must be combined with clinical observations, patient history, and epidemiological information.  Fact Sheet for Patients:   StrictlyIdeas.no  Fact Sheet for Healthcare Providers: BankingDealers.co.za  This test is not yet approved or  cleared by the [place] FDA and has been authorized for detection and/or diagnosis of SARS-CoV-2 by FDA under an Emergency Use Authorization (EUA).  This EUA will remain in effect (meaning this test can be used) for the duration of the COVID-19 declaration under Section 564(b)(1) of the Act, 21 U.S.C. section 360bbb-3(b)(1), unless the  authorization is terminated or revoked sooner.  Performed at [facility], [place] [address], [place], [place] [address]   Blood Culture (routine x 2)     Status: None (Preliminary  result)   Collection Time: 11/05/19  6:41 PM   Specimen: BLOOD LEFT FOREARM  Result Value Ref Range Status   Specimen Description   Final    BLOOD LEFT FOREARM Performed at [place] [address], [place], [place] [address]    Special Requests   Final    BOTTLES DRAWN AEROBIC ONLY Blood Culture results may not be optimal due to an inadequate volume of blood received in culture bottles Performed at [place] [address] [place], [place] [address]    Culture   Final    NO GROWTH 4 DAYS Performed at [facility], [place] [address], [place], [place] [address]    Report Status PENDING  Incomplete  Urine culture     Status: None   Collection Time: 11/06/19  1:00 AM   Specimen: In/Out Cath Urine  Result Value Ref Range Status   Specimen Description   Final    IN/OUT CATH URINE Performed at [place] [address] [place], [place] [address]    Special Requests   Final    NONE Performed at [facility], [place] [address], [place], [place] [address]    Culture   Final    NO GROWTH Performed at [facility], [place] [address] [place], [place] [address]    Report Status 11/07/2019 FINAL  Final     Time coordinating discharge:  I have spent 35 minutes face to face with the patient and on the ward discussing the patients care, assessment, plan and disposition with other care givers. >50% of the time was devoted counseling the patient about the risks and benefits of treatment/Discharge disposition and coordinating care.   SIGNED:   Damita Lack, MD  [facility] 11/09/2019, 12:03 PM   If 7PM-7AM, please contact night-coverage

## 2019-11-10 LAB — CULTURE, BLOOD (ROUTINE X 2)
Culture: NO GROWTH
Culture: NO GROWTH
Special Requests: ADEQUATE

## 2019-11-10 LAB — LEGIONELLA PNEUMOPHILA SEROGP 1 UR AG: L. pneumophila Serogp 1 Ur Ag: NEGATIVE

## 2019-11-21 NOTE — Progress Notes (Signed)
Cardiology Clinic Note   Patient Name: TIFFANEE MCNEE Date of Encounter: 11/22/2019  Primary Care Provider:  [name], NP Primary Cardiologist:  [name], MD  Patient Profile    Roland Earl. Fiore 74 year old female presents the clinic today for follow-up of her CHF.  Past Medical History    Past Medical History:  Diagnosis Date  . Arthritis   . Depression   . Diabetes mellitus   . Hiatal hernia   . Hypercholesteremia   . Hypertension   . Reflux   . Renal disorder    shutting down 4 years ago   Past Surgical History:  Procedure Laterality Date  . CHOLECYSTECTOMY    . COLONOSCOPY WITH PROPOFOL N/A 05/20/2017   Procedure: COLONOSCOPY WITH PROPOFOL;  Surgeon: Carol Ada, MD;  Location: [facility];  Service: Endoscopy;  Laterality: N/A;  . ESOPHAGOGASTRODUODENOSCOPY N/A 05/19/2017   Procedure: ESOPHAGOGASTRODUODENOSCOPY (EGD);  Surgeon: Juanita Craver, MD;  Location: [facility] ENDOSCOPY;  Service: Endoscopy;  Laterality: N/A;  . ESOPHAGOGASTRODUODENOSCOPY (EGD) WITH PROPOFOL N/A 01/17/2019   Procedure: ESOPHAGOGASTRODUODENOSCOPY (EGD) WITH PROPOFOL;  Surgeon: Carol Ada, MD;  Location: [facility] ENDOSCOPY;  Service: Endoscopy;  Laterality: N/A;  . SAVORY DILATION N/A 01/17/2019   Procedure: SAVORY DILATION;  Surgeon: Carol Ada, MD;  Location: [facility] ENDOSCOPY;  Service: Endoscopy;  Laterality: N/A;    Allergies  Allergies  Allergen Reactions  . Amlodipine Besylate Swelling    swelling    History of Present Illness    Ms. Crisp has a past medical history of benign essential hypertension, hypotension, acute CHF, demand ischemia, CAD, community-acquired pneumonia, GERD, type 2 diabetes, stage I chronic kidney disease, dyslipidemia, hyperkalemia, shortness of breath, and depression.  She was admitted to the hospital on 11/05/2019 through 11/09/2019.  She presented to the emergency department with shortness of breath and cough.  Her CTA was negative for PE but concerning for  pneumonia, atelectasis and fluid overload.  She had completed a course of amoxicillin.  Upon admission she was started on Rocephin and IV diuresis.  She received 1 unit of PRBC, her GI work-up was negative.  She did well with bronchodilators, IV ABX, and diuretics.  Her breathing improved slowly over the course of 3 days.  She was also diagnosed with iron deficiency anemia and prescribed iron supplement.  Cardiology was consulted due to elevated troponins.  This was felt to be related to demand ischemia.  Her echocardiogram showed an LVEF of 45-50% with global hypokinesis.  EKG showed no evidence of ischemia.  Due to her lack of ischemic symptoms and anemia cardiac catheterization was not planned.  Recommend maintaining hemoglobin greater than 8.  She presents to the clinic today for follow-up evaluation and states she is feeling much better.  She has been walking 30 to 45 minutes/day.  She states that she is trying to eat better and used to be 100 pounds heavier.  She states that she eats a lot of junk food and needs to cut out snacks from her diet.  She has not yet been to GI for follow-up evaluation for chronic anemia.  However, she does state that she has had several evaluations in the form of colonoscopy and EGD with no success in finding GI bleeding in the past.  I will give her the salty 6 diet she, ask her to increase her physical activity as tolerated, and have her follow-up with Dr. Oval Linsey in 3 months.  Today she denies chest pain, shortness of breath, lower extremity edema, fatigue,  palpitations, melena, hematuria, hemoptysis, diaphoresis, weakness, presyncope, syncope, orthopnea, and PND.   Home Medications    Prior to Admission medications   Medication Sig Start Date End Date Taking? Authorizing Provider  amitriptyline (ELAVIL) 25 MG tablet Take 25 mg by mouth at bedtime.    [provider]  atorvastatin (LIPITOR) 20 MG tablet Take 1 tablet (20 mg total) by mouth daily. 11/10/19    Amin, Jeanella Flattery, MD  benzonatate (TESSALON) 100 MG capsule Take 100 mg by mouth 4 (four) times daily as needed for cough.  10/25/19   [provider]  bisacodyl (DULCOLAX) 5 MG EC tablet Take 1 tablet (5 mg total) by mouth daily as needed for moderate constipation. 11/09/19   Amin, Jeanella Flattery, MD  CONSTULOSE 10 GM/15ML solution Take 10 g by mouth daily as needed for mild constipation.  10/25/19   [provider]  escitalopram (LEXAPRO) 5 MG tablet Take 5 mg by mouth daily. 10/03/19   [provider]  ferrous sulfate 325 (65 FE) MG tablet Take 1 tablet (325 mg total) by mouth 2 (two) times daily with a meal. 11/09/19   Amin, Jeanella Flattery, MD  folic acid (FOLVITE) 1 MG tablet Take 1 mg by mouth daily.    [provider]  furosemide (LASIX) 40 MG tablet Take 1 tablet (40 mg total) by mouth daily. 11/10/19   Amin, Jeanella Flattery, MD  gabapentin (NEURONTIN) 400 MG capsule Take 400 mg by mouth 3 (three) times daily. 10/03/19   [provider]  HYDROcodone-acetaminophen (NORCO/VICODIN) 5-325 MG tablet Take 1 tablet by mouth every 6 (six) hours as needed for moderate pain. 11/09/19   Amin, Jeanella Flattery, MD  hydroxychloroquine (PLAQUENIL) 200 MG tablet Take 200 mg by mouth daily. 10/04/19   [provider]  JANUVIA 100 MG tablet Take 100 mg by mouth daily. 10/02/19   [provider]  metoprolol tartrate (LOPRESSOR) 25 MG tablet Take 1 tablet (25 mg total) by mouth 2 (two) times daily. 11/09/19   Amin, Jeanella Flattery, MD  pantoprazole (PROTONIX) 40 MG tablet Take 1 tablet (40 mg total) by mouth 2 (two) times daily. 01/18/19   Mariel Aloe, MD  polyethylene glycol (MIRALAX / GLYCOLAX) 17 g packet Take 17 g by mouth daily as needed for moderate constipation or severe constipation. 11/09/19   Amin, Jeanella Flattery, MD  potassium chloride (KLOR-CON) 10 MEQ tablet Take 10 mEq by mouth daily. 10/25/19   [provider]  ropinirole (REQUIP) 5 MG tablet Take 5 mg  by mouth at bedtime. 10/02/19   [provider]  senna-docusate (SENOKOT-S) 8.6-50 MG tablet Take 1 tablet by mouth at bedtime. 11/09/19   Damita Lack, MD    Family History    Family History  Problem Relation Age of Onset  . Heart attack Sister   . Heart failure Sister   . Heart attack Brother   . Heart failure Brother    She indicated that her mother is deceased. She indicated that her father is deceased. She indicated that her sister is deceased. She indicated that her brother is deceased.  Social History    Social History   Socioeconomic History  . Marital status: Married    Spouse name: Not on file  . Number of children: Not on file  . Years of education: Not on file  . Highest education level: Not on file  Occupational History  . Not on file  Tobacco Use  . Smoking status: Never Smoker  .  Smokeless tobacco: Never Used  Vaping Use  . Vaping Use: Never used  Substance and Sexual Activity  . Alcohol use: No  . Drug use: No  . Sexual activity: Not Currently  Other Topics Concern  . Not on file  Social History Narrative  . Not on file   Social Determinants of Health   Financial Resource Strain:   . Difficulty of Paying Living Expenses:   Food Insecurity:   . Worried About Charity fundraiser in the Last Year:   . Arboriculturist in the Last Year:   Transportation Needs:   . Film/video editor (Medical):   Marland Kitchen Lack of Transportation (Non-Medical):   Physical Activity:   . Days of Exercise per Week:   . Minutes of Exercise per Session:   Stress:   . Feeling of Stress :   Social Connections:   . Frequency of Communication with Friends and Family:   . Frequency of Social Gatherings with Friends and Family:   . Attends Religious Services:   . Active Member of Clubs or Organizations:   . Attends Archivist Meetings:   Marland Kitchen Marital Status:   Intimate Partner Violence:   . Fear of Current or Ex-Partner:   . Emotionally Abused:   Marland Kitchen  Physically Abused:   . Sexually Abused:      Review of Systems    General:  No chills, fever, night sweats or weight changes.  Cardiovascular:  No chest pain, dyspnea on exertion, edema, orthopnea, palpitations, paroxysmal nocturnal dyspnea. Dermatological: No rash, lesions/masses Respiratory: No cough, dyspnea Urologic: No hematuria, dysuria Abdominal:   No nausea, vomiting, diarrhea, bright red blood per rectum, melena, or hematemesis Neurologic:  No visual changes, wkns, changes in mental status. All other systems reviewed and are otherwise negative except as noted above.  Physical Exam    VS:  BP 131/78   Pulse 81   Ht 5\' 1"  (1.549 m)   Wt 173 lb (78.5 kg)   SpO2 99%   BMI 32.69 kg/m  , BMI Body mass index is 32.69 kg/m. GEN: Well nourished, well developed, in no acute distress. HEENT: normal. Neck: Supple, no JVD, carotid bruits, or masses. Cardiac: RRR, no murmurs, rubs, or gallops. No clubbing, cyanosis, edema.  Radials/DP/PT 2+ and equal bilaterally.  Respiratory:  Respirations regular and unlabored, clear to auscultation bilaterally. GI: Soft, nontender, nondistended, BS + x 4. MS: no deformity or atrophy. Skin: warm and dry, no rash. Neuro:  Strength and sensation are intact. Psych: Normal affect.  Accessory Clinical Findings    ECG personally reviewed by me today-none today.  EKG 11/05/2019 Sinus tachycardia 107 bpm  Echocardiogram 11/06/2019  1. Mild global reduction in LV systolic function; grade 1 diastolic  dysfunction; mild MR; mild LAE.  2. Left ventricular ejection fraction, by estimation, is 45 to 50%. The  left ventricle has mildly decreased function. The left ventricle  demonstrates global hypokinesis. Left ventricular diastolic parameters are  consistent with Grade I diastolic  dysfunction (impaired relaxation). Elevated left atrial pressure.  3. Right ventricular systolic function is normal. The right ventricular  size is normal. There is  mildly elevated pulmonary artery systolic  pressure.  4. Left atrial size was mildly dilated.  5. The mitral valve is normal in structure. Mild mitral valve  regurgitation. No evidence of mitral stenosis.  6. The aortic valve is tricuspid. Aortic valve regurgitation is not  visualized. Mild to moderate aortic valve sclerosis/calcification is  present, without  any evidence of aortic stenosis.  7. The inferior vena cava is normal in size with greater than 50%  respiratory variability, suggesting right atrial pressure of 3 mmHg.  Assessment & Plan   1.  Demand ischemia-no increased shortness of breath today or DOE.  She has been slowly increasing her physical activity at home.  Continues with no chest pain.  No aspirin due to anemia.  Plan for ischemic evaluation once she has fully recovered from pneumonia. Continue metoprolol Heart healthy low-sodium diet-salty 6 given Increase physical activity as tolerated  Acute systolic and diastolic CHF-euvolemic today.  No increased work of breathing.  Echocardiogram showed LVEF of 45-50% with global hypokinesis, G1 DD, no significant valvular abnormalities. Continue furosemide Daily weights Heart healthy low-sodium diet-salty 6 given Increase physical activity as tolerated  Hyperlipidemia-LDL 118 on 03/25/2010 Continue atorvastatin Repeat lipid panel  Acute on chronic anemia-received 1 unit of PRBCs and was started on iron supplementation.  Recommended stress test versus cardiac catheterization once her source of the anemia has been determined. Follow-up with GI  Disposition: Follow-up with Dr. Oval Linsey in 3 months.  Jossie Ng. Moselle Rister NP-C    11/22/2019, [address] [address] Office [phone] Fax [phone]

## 2019-11-22 ENCOUNTER — Ambulatory Visit (INDEPENDENT_AMBULATORY_CARE_PROVIDER_SITE_OTHER): Payer: Medicare HMO | Admitting: General Practice

## 2019-11-22 ENCOUNTER — Other Ambulatory Visit: Payer: Self-pay

## 2019-11-22 ENCOUNTER — Encounter: Payer: Self-pay | Admitting: General Practice

## 2019-11-22 VITALS — BP 131/78 | HR 81 | Ht 61.0 in | Wt 173.0 lb

## 2019-11-22 DIAGNOSIS — I248 Other forms of acute ischemic heart disease: Secondary | ICD-10-CM | POA: Diagnosis not present

## 2019-11-22 DIAGNOSIS — I5041 Acute combined systolic (congestive) and diastolic (congestive) heart failure: Secondary | ICD-10-CM | POA: Diagnosis not present

## 2019-11-22 DIAGNOSIS — D649 Anemia, unspecified: Secondary | ICD-10-CM

## 2019-11-22 DIAGNOSIS — E7800 Pure hypercholesterolemia, unspecified: Secondary | ICD-10-CM

## 2019-11-22 DIAGNOSIS — Z79899 Other long term (current) drug therapy: Secondary | ICD-10-CM

## 2019-11-22 LAB — LDL CHOLESTEROL, DIRECT: LDL Direct: 44 mg/dL (ref 0–99)

## 2019-11-22 NOTE — Patient Instructions (Signed)
Medication Instructions:  The current medical regimen is effective;  continue present plan and medications as directed. Please refer to the Current Medication list given to you today. *If you need a refill on your cardiac medications before your next appointment, please call your pharmacy*  Lab Work: DIRECT LDL TODAY-HERE IN OUR OFFICE If you have labs (blood work) drawn today and your tests are completely normal, you will receive your results only by:  [place] (if you have MyChart) OR A paper copy in the mail.  If you have any lab test that is abnormal or we need to change your treatment, we will call you to review the results. You may go to any [facility] that is convenient for you however, we do have a lab in our office that is able to assist you. You DO NOT need an appointment for our lab. The lab is open 8:00am and closes at 4:00pm. Lunch 12:45 - 1:45pm.  Special Instructions PLEASE SCHEDULE APPOINTMENT WITH YOUR GI DOCTOR FOR ANEMIA  PLEASE READ AND FOLLOW SALTY 6-ATTACHED  PLEASE INCREASE PHYSICAL ACTIVITY AS TOLERATED  Follow-Up: Your next appointment:  3 month(s)  In Person with [name], MD  At [facility], you and your health needs are our priority.  As part of our continuing mission to provide you with exceptional heart care, we have created designated Provider Care Teams.  These Care Teams include your primary Cardiologist (physician) and Advanced Practice Providers (APPs -  Physician Assistants and Nurse Practitioners) who all work together to provide you with the care you need, when you need it.  We recommend signing up for the patient portal called "MyChart".  Sign up information is provided on this After Visit Summary.  MyChart is used to connect with patients for Virtual Visits (Telemedicine).  Patients are able to view lab/test results, encounter notes, upcoming appointments, etc.  Non-urgent messages can be sent to your provider as well.   To learn more about  what you can do with MyChart, go to [email or web address].

## 2019-12-23 ENCOUNTER — Emergency Department (HOSPITAL_COMMUNITY): Payer: Medicare HMO

## 2019-12-23 ENCOUNTER — Inpatient Hospital Stay (HOSPITAL_COMMUNITY)
Admission: EM | Admit: 2019-12-23 | Discharge: 2019-12-27 | DRG: 682 | Disposition: A | Discharge status: Home Health | Payer: Medicare HMO | Attending: Internal Medicine | Admitting: Internal Medicine

## 2019-12-23 ENCOUNTER — Encounter (HOSPITAL_COMMUNITY): Payer: Self-pay | Admitting: Emergency Medicine

## 2019-12-23 ENCOUNTER — Other Ambulatory Visit: Payer: Self-pay

## 2019-12-23 DIAGNOSIS — E1142 Type 2 diabetes mellitus with diabetic polyneuropathy: Secondary | ICD-10-CM | POA: Diagnosis present

## 2019-12-23 DIAGNOSIS — I130 Hypertensive heart and chronic kidney disease with heart failure and stage 1 through stage 4 chronic kidney disease, or unspecified chronic kidney disease: Secondary | ICD-10-CM | POA: Diagnosis present

## 2019-12-23 DIAGNOSIS — A419 Sepsis, unspecified organism: Secondary | ICD-10-CM | POA: Diagnosis not present

## 2019-12-23 DIAGNOSIS — D500 Iron deficiency anemia secondary to blood loss (chronic): Secondary | ICD-10-CM | POA: Diagnosis not present

## 2019-12-23 DIAGNOSIS — E1165 Type 2 diabetes mellitus with hyperglycemia: Secondary | ICD-10-CM | POA: Diagnosis present

## 2019-12-23 DIAGNOSIS — Z20822 Contact with and (suspected) exposure to covid-19: Secondary | ICD-10-CM | POA: Diagnosis present

## 2019-12-23 DIAGNOSIS — D509 Iron deficiency anemia, unspecified: Secondary | ICD-10-CM | POA: Diagnosis present

## 2019-12-23 DIAGNOSIS — K921 Melena: Secondary | ICD-10-CM | POA: Diagnosis present

## 2019-12-23 DIAGNOSIS — Z8701 Personal history of pneumonia (recurrent): Secondary | ICD-10-CM

## 2019-12-23 DIAGNOSIS — E7800 Pure hypercholesterolemia, unspecified: Secondary | ICD-10-CM | POA: Diagnosis present

## 2019-12-23 DIAGNOSIS — R197 Diarrhea, unspecified: Secondary | ICD-10-CM | POA: Diagnosis present

## 2019-12-23 DIAGNOSIS — R531 Weakness: Secondary | ICD-10-CM | POA: Diagnosis present

## 2019-12-23 DIAGNOSIS — E11649 Type 2 diabetes mellitus with hypoglycemia without coma: Secondary | ICD-10-CM | POA: Diagnosis not present

## 2019-12-23 DIAGNOSIS — E860 Dehydration: Secondary | ICD-10-CM | POA: Diagnosis present

## 2019-12-23 DIAGNOSIS — I2510 Atherosclerotic heart disease of native coronary artery without angina pectoris: Secondary | ICD-10-CM | POA: Diagnosis present

## 2019-12-23 DIAGNOSIS — R6510 Systemic inflammatory response syndrome (SIRS) of non-infectious origin without acute organ dysfunction: Secondary | ICD-10-CM | POA: Diagnosis present

## 2019-12-23 DIAGNOSIS — F329 Major depressive disorder, single episode, unspecified: Secondary | ICD-10-CM | POA: Diagnosis present

## 2019-12-23 DIAGNOSIS — D631 Anemia in chronic kidney disease: Secondary | ICD-10-CM | POA: Diagnosis present

## 2019-12-23 DIAGNOSIS — N179 Acute kidney failure, unspecified: Secondary | ICD-10-CM | POA: Diagnosis present

## 2019-12-23 DIAGNOSIS — E1122 Type 2 diabetes mellitus with diabetic chronic kidney disease: Secondary | ICD-10-CM | POA: Diagnosis present

## 2019-12-23 DIAGNOSIS — R05 Cough: Secondary | ICD-10-CM | POA: Diagnosis present

## 2019-12-23 DIAGNOSIS — Z79899 Other long term (current) drug therapy: Secondary | ICD-10-CM

## 2019-12-23 DIAGNOSIS — Z9114 Patient's other noncompliance with medication regimen: Secondary | ICD-10-CM

## 2019-12-23 DIAGNOSIS — E669 Obesity, unspecified: Secondary | ICD-10-CM | POA: Diagnosis present

## 2019-12-23 DIAGNOSIS — E785 Hyperlipidemia, unspecified: Secondary | ICD-10-CM | POA: Diagnosis present

## 2019-12-23 DIAGNOSIS — Z8249 Family history of ischemic heart disease and other diseases of the circulatory system: Secondary | ICD-10-CM

## 2019-12-23 DIAGNOSIS — E871 Hypo-osmolality and hyponatremia: Secondary | ICD-10-CM | POA: Diagnosis present

## 2019-12-23 DIAGNOSIS — Z6833 Body mass index (BMI) 33.0-33.9, adult: Secondary | ICD-10-CM

## 2019-12-23 DIAGNOSIS — G2581 Restless legs syndrome: Secondary | ICD-10-CM | POA: Diagnosis present

## 2019-12-23 DIAGNOSIS — R296 Repeated falls: Secondary | ICD-10-CM | POA: Diagnosis present

## 2019-12-23 DIAGNOSIS — E1149 Type 2 diabetes mellitus with other diabetic neurological complication: Secondary | ICD-10-CM

## 2019-12-23 DIAGNOSIS — Z888 Allergy status to other drugs, medicaments and biological substances status: Secondary | ICD-10-CM

## 2019-12-23 DIAGNOSIS — E041 Nontoxic single thyroid nodule: Secondary | ICD-10-CM | POA: Diagnosis present

## 2019-12-23 DIAGNOSIS — N181 Chronic kidney disease, stage 1: Secondary | ICD-10-CM | POA: Diagnosis present

## 2019-12-23 DIAGNOSIS — K449 Diaphragmatic hernia without obstruction or gangrene: Secondary | ICD-10-CM | POA: Diagnosis present

## 2019-12-23 DIAGNOSIS — R2681 Unsteadiness on feet: Secondary | ICD-10-CM | POA: Diagnosis present

## 2019-12-23 DIAGNOSIS — D649 Anemia, unspecified: Secondary | ICD-10-CM | POA: Diagnosis present

## 2019-12-23 DIAGNOSIS — K219 Gastro-esophageal reflux disease without esophagitis: Secondary | ICD-10-CM | POA: Diagnosis present

## 2019-12-23 DIAGNOSIS — I5042 Chronic combined systolic (congestive) and diastolic (congestive) heart failure: Secondary | ICD-10-CM | POA: Diagnosis present

## 2019-12-23 DIAGNOSIS — W19XXXA Unspecified fall, initial encounter: Secondary | ICD-10-CM

## 2019-12-23 DIAGNOSIS — G9341 Metabolic encephalopathy: Secondary | ICD-10-CM | POA: Diagnosis present

## 2019-12-23 DIAGNOSIS — I10 Essential (primary) hypertension: Secondary | ICD-10-CM | POA: Diagnosis not present

## 2019-12-23 DIAGNOSIS — R6520 Severe sepsis without septic shock: Secondary | ICD-10-CM | POA: Diagnosis not present

## 2019-12-23 DIAGNOSIS — Z8719 Personal history of other diseases of the digestive system: Secondary | ICD-10-CM

## 2019-12-23 DIAGNOSIS — E559 Vitamin D deficiency, unspecified: Secondary | ICD-10-CM | POA: Diagnosis present

## 2019-12-23 DIAGNOSIS — R1084 Generalized abdominal pain: Secondary | ICD-10-CM | POA: Diagnosis present

## 2019-12-23 DIAGNOSIS — Z90710 Acquired absence of both cervix and uterus: Secondary | ICD-10-CM

## 2019-12-23 DIAGNOSIS — N1831 Chronic kidney disease, stage 3a: Secondary | ICD-10-CM | POA: Diagnosis present

## 2019-12-23 LAB — CBG MONITORING, ED
Glucose-Capillary: 74 mg/dL (ref 70–99)
Glucose-Capillary: 78 mg/dL (ref 70–99)

## 2019-12-23 LAB — RAPID URINE DRUG SCREEN, HOSP PERFORMED
Amphetamines: NOT DETECTED
Barbiturates: NOT DETECTED
Benzodiazepines: NOT DETECTED
Cocaine: NOT DETECTED
Opiates: NOT DETECTED
Tetrahydrocannabinol: NOT DETECTED

## 2019-12-23 LAB — COMPREHENSIVE METABOLIC PANEL
ALT: 10 U/L (ref 0–44)
AST: 18 U/L (ref 15–41)
Albumin: 3.5 g/dL (ref 3.5–5.0)
Alkaline Phosphatase: 121 U/L (ref 38–126)
Anion gap: 10 (ref 5–15)
BUN: 41 mg/dL — ABNORMAL HIGH (ref 8–23)
CO2: 26 mmol/L (ref 22–32)
Calcium: 9.5 mg/dL (ref 8.9–10.3)
Chloride: 98 mmol/L (ref 98–111)
Creatinine, Ser: 2.16 mg/dL — ABNORMAL HIGH (ref 0.44–1.00)
GFR calc Af Amer: 26 mL/min — ABNORMAL LOW (ref >60)
GFR calc non Af Amer: 22 mL/min — ABNORMAL LOW (ref >60)
Glucose, Bld: 105 mg/dL — ABNORMAL HIGH (ref 70–99)
Potassium: 4.7 mmol/L (ref 3.5–5.1)
Sodium: 134 mmol/L — ABNORMAL LOW (ref 135–145)
Total Bilirubin: 0.9 mg/dL (ref 0.3–1.2)
Total Protein: 6.9 g/dL (ref 6.5–8.1)

## 2019-12-23 LAB — CBC WITH DIFFERENTIAL/PLATELET
Abs Immature Granulocytes: 0.03 10*3/uL (ref 0.00–0.07)
Basophils Absolute: 0 10*3/uL (ref 0.0–0.1)
Basophils Relative: 0 %
Eosinophils Absolute: 0 10*3/uL (ref 0.0–0.5)
Eosinophils Relative: 0 %
HCT: 35.9 % — ABNORMAL LOW (ref 36.0–46.0)
Hemoglobin: 11.5 g/dL — ABNORMAL LOW (ref 12.0–15.0)
Immature Granulocytes: 0 %
Lymphocytes Relative: 25 %
Lymphs Abs: 3.2 10*3/uL (ref 0.7–4.0)
MCH: 26.9 pg (ref 26.0–34.0)
MCHC: 32 g/dL (ref 30.0–36.0)
MCV: 84.1 fL (ref 80.0–100.0)
Monocytes Absolute: 0.8 10*3/uL (ref 0.1–1.0)
Monocytes Relative: 6 %
Neutro Abs: 9.1 10*3/uL — ABNORMAL HIGH (ref 1.7–7.7)
Neutrophils Relative %: 69 %
Platelets: 239 10*3/uL (ref 150–400)
RBC: 4.27 MIL/uL (ref 3.87–5.11)
RDW: 18.3 % — ABNORMAL HIGH (ref 11.5–15.5)
WBC: 13.2 10*3/uL — ABNORMAL HIGH (ref 4.0–10.5)
nRBC: 0 % (ref 0.0–0.2)

## 2019-12-23 LAB — URINALYSIS, ROUTINE W REFLEX MICROSCOPIC
Bilirubin Urine: NEGATIVE
Glucose, UA: NEGATIVE mg/dL
Hgb urine dipstick: NEGATIVE
Ketones, ur: NEGATIVE mg/dL
Leukocytes,Ua: NEGATIVE
Nitrite: NEGATIVE
Protein, ur: NEGATIVE mg/dL
Specific Gravity, Urine: 1.014 (ref 1.005–1.030)
pH: 5 (ref 5.0–8.0)

## 2019-12-23 LAB — TYPE AND SCREEN
ABO/RH(D): O POS
Antibody Screen: NEGATIVE

## 2019-12-23 LAB — SARS CORONAVIRUS 2 BY RT PCR (HOSPITAL ORDER, PERFORMED IN ~~LOC~~ HOSPITAL LAB): SARS Coronavirus 2: NEGATIVE

## 2019-12-23 LAB — POC OCCULT BLOOD, ED: Fecal Occult Bld: POSITIVE — AB

## 2019-12-23 LAB — APTT: aPTT: 28 seconds (ref 24–36)

## 2019-12-23 LAB — PROTIME-INR
INR: 1.3 — ABNORMAL HIGH (ref 0.8–1.2)
Prothrombin Time: 15.5 seconds — ABNORMAL HIGH (ref 11.4–15.2)

## 2019-12-23 LAB — LACTIC ACID, PLASMA
Lactic Acid, Venous: 1.1 mmol/L (ref 0.5–1.9)
Lactic Acid, Venous: 1.1 mmol/L (ref 0.5–1.9)

## 2019-12-23 LAB — LIPASE, BLOOD: Lipase: 22 U/L (ref 11–51)

## 2019-12-23 MED ORDER — POLYETHYLENE GLYCOL 3350 17 G PO PACK: 17.0000 g | PACK | Freq: Every day | ORAL | Status: DC | PRN

## 2019-12-23 MED ORDER — LACTULOSE 10 GM/15ML PO SOLN: 10.0000 g | Freq: Every day | ORAL | Status: DC | PRN

## 2019-12-23 MED ORDER — FOLIC ACID 1 MG PO TABS
1.0000 mg | ORAL_TABLET | Freq: Every day | ORAL | Status: DC
  Administered 2019-12-24 – 2019-12-27 (×4): 1 mg via ORAL
  Filled 2019-12-23 (×4): qty 1

## 2019-12-23 MED ORDER — PANTOPRAZOLE SODIUM 40 MG PO TBEC
40.0000 mg | DELAYED_RELEASE_TABLET | Freq: Two times a day (BID) | ORAL | Status: DC
  Administered 2019-12-24: 40 mg via ORAL
  Filled 2019-12-23: qty 1

## 2019-12-23 MED ORDER — METRONIDAZOLE IN NACL 5-0.79 MG/ML-% IV SOLN
500.0000 mg | Freq: Three times a day (TID) | INTRAVENOUS | Status: DC
  Administered 2019-12-23 – 2019-12-25 (×6): 500 mg via INTRAVENOUS
  Filled 2019-12-23 (×6): qty 100

## 2019-12-23 MED ORDER — SODIUM CHLORIDE 0.9 % IV BOLUS (SEPSIS)
1000.0000 mL | Freq: Once | INTRAVENOUS | Status: AC
  Administered 2019-12-23: 1000 mL via INTRAVENOUS

## 2019-12-23 MED ORDER — CYCLOBENZAPRINE HCL 10 MG PO TABS
10.0000 mg | ORAL_TABLET | Freq: Three times a day (TID) | ORAL | Status: DC
  Administered 2019-12-24 – 2019-12-27 (×9): 10 mg via ORAL
  Filled 2019-12-23 (×9): qty 1

## 2019-12-23 MED ORDER — LORAZEPAM 2 MG/ML IJ SOLN
1.0000 mg | Freq: Once | INTRAMUSCULAR | Status: AC
  Administered 2019-12-23: 1 mg via INTRAVENOUS
  Filled 2019-12-23: qty 1

## 2019-12-23 MED ORDER — FERROUS SULFATE 325 (65 FE) MG PO TABS
325.0000 mg | ORAL_TABLET | Freq: Two times a day (BID) | ORAL | Status: DC
  Administered 2019-12-24 – 2019-12-27 (×7): 325 mg via ORAL
  Filled 2019-12-23 (×6): qty 1

## 2019-12-23 MED ORDER — HYDROCODONE-ACETAMINOPHEN 5-325 MG PO TABS
1.0000 | ORAL_TABLET | Freq: Four times a day (QID) | ORAL | Status: DC | PRN
  Administered 2019-12-24: 1 via ORAL
  Filled 2019-12-23: qty 1

## 2019-12-23 MED ORDER — BISACODYL 5 MG PO TBEC: 5.0000 mg | DELAYED_RELEASE_TABLET | Freq: Every day | ORAL | Status: DC | PRN

## 2019-12-23 MED ORDER — ATORVASTATIN CALCIUM 20 MG PO TABS
20.0000 mg | ORAL_TABLET | Freq: Every day | ORAL | Status: DC
  Administered 2019-12-24 – 2019-12-27 (×4): 20 mg via ORAL
  Filled 2019-12-23: qty 2
  Filled 2019-12-23 (×2): qty 1
  Filled 2019-12-23: qty 2

## 2019-12-23 MED ORDER — FUROSEMIDE 40 MG PO TABS: 40.0000 mg | ORAL_TABLET | Freq: Every day | ORAL | Status: DC

## 2019-12-23 MED ORDER — ROPINIROLE HCL 1 MG PO TABS
5.0000 mg | ORAL_TABLET | Freq: Every day | ORAL | Status: DC
  Administered 2019-12-24 – 2019-12-26 (×3): 5 mg via ORAL
  Filled 2019-12-23 (×3): qty 5

## 2019-12-23 MED ORDER — SODIUM CHLORIDE 0.9 % IV SOLN
2.0000 g | Freq: Once | INTRAVENOUS | Status: AC
  Administered 2019-12-23: 2 g via INTRAVENOUS
  Filled 2019-12-23: qty 20

## 2019-12-23 MED ORDER — ESCITALOPRAM OXALATE 10 MG PO TABS
10.0000 mg | ORAL_TABLET | Freq: Every day | ORAL | Status: DC
  Administered 2019-12-24 – 2019-12-27 (×4): 10 mg via ORAL
  Filled 2019-12-23 (×4): qty 1

## 2019-12-23 MED ORDER — ENOXAPARIN SODIUM 30 MG/0.3ML ~~LOC~~ SOLN
30.0000 mg | SUBCUTANEOUS | Status: DC
  Administered 2019-12-23 – 2019-12-24 (×2): 30 mg via SUBCUTANEOUS
  Filled 2019-12-23 (×2): qty 0.3

## 2019-12-23 MED ORDER — POTASSIUM CHLORIDE ER 10 MEQ PO TBCR
10.0000 meq | EXTENDED_RELEASE_TABLET | Freq: Every day | ORAL | Status: DC
  Administered 2019-12-24 – 2019-12-27 (×4): 10 meq via ORAL
  Filled 2019-12-23 (×8): qty 1

## 2019-12-23 MED ORDER — GABAPENTIN 300 MG PO CAPS: 400.0000 mg | ORAL_CAPSULE | Freq: Three times a day (TID) | ORAL | Status: DC

## 2019-12-23 MED ORDER — ACETAMINOPHEN 325 MG PO TABS
650.0000 mg | ORAL_TABLET | Freq: Once | ORAL | Status: AC
  Administered 2019-12-23: 650 mg via ORAL
  Filled 2019-12-23: qty 2

## 2019-12-23 MED ORDER — METRONIDAZOLE IN NACL 5-0.79 MG/ML-% IV SOLN
500.0000 mg | Freq: Once | INTRAVENOUS | Status: AC
  Administered 2019-12-23: 500 mg via INTRAVENOUS
  Filled 2019-12-23: qty 100

## 2019-12-23 MED ORDER — HYDROXYCHLOROQUINE SULFATE 200 MG PO TABS
200.0000 mg | ORAL_TABLET | Freq: Every day | ORAL | Status: DC
  Administered 2019-12-24 – 2019-12-27 (×4): 200 mg via ORAL
  Filled 2019-12-23 (×5): qty 1

## 2019-12-23 MED ORDER — METOPROLOL TARTRATE 25 MG PO TABS
25.0000 mg | ORAL_TABLET | Freq: Two times a day (BID) | ORAL | Status: DC
  Administered 2019-12-24 – 2019-12-27 (×7): 25 mg via ORAL
  Filled 2019-12-23 (×7): qty 1

## 2019-12-23 MED ORDER — LINAGLIPTIN 5 MG PO TABS
5.0000 mg | ORAL_TABLET | Freq: Every day | ORAL | Status: DC
  Filled 2019-12-23: qty 1

## 2019-12-23 MED ORDER — DIPHENHYDRAMINE HCL 50 MG/ML IJ SOLN
25.0000 mg | Freq: Once | INTRAMUSCULAR | Status: AC
  Administered 2019-12-23: 25 mg via INTRAVENOUS
  Filled 2019-12-23: qty 1

## 2019-12-23 MED ORDER — INSULIN ASPART 100 UNIT/ML ~~LOC~~ SOLN
0.0000 [IU] | Freq: Three times a day (TID) | SUBCUTANEOUS | Status: DC
  Administered 2019-12-24 (×2): 4 [IU] via SUBCUTANEOUS
  Filled 2019-12-23: qty 0.2

## 2019-12-23 MED ORDER — SENNOSIDES-DOCUSATE SODIUM 8.6-50 MG PO TABS
1.0000 | ORAL_TABLET | Freq: Every day | ORAL | Status: DC
  Administered 2019-12-25 – 2019-12-26 (×2): 1 via ORAL
  Filled 2019-12-23 (×2): qty 1

## 2019-12-23 MED ORDER — SODIUM CHLORIDE 0.9 % IV SOLN: INTRAVENOUS | Status: DC

## 2019-12-23 MED ORDER — SODIUM CHLORIDE 0.45 % IV SOLN: INTRAVENOUS | Status: DC

## 2019-12-23 MED ORDER — SODIUM CHLORIDE 0.9 % IV SOLN
2.0000 g | INTRAVENOUS | Status: DC
  Filled 2019-12-23 (×2): qty 20

## 2019-12-23 MED ORDER — SODIUM CHLORIDE 0.9 % IV BOLUS: 1000.0000 mL | Freq: Once | INTRAVENOUS | Status: DC

## 2019-12-23 NOTE — ED Triage Notes (Signed)
Patient here from home reporting multiple falls this week. Reports hitting head twice. AAO.

## 2019-12-23 NOTE — H&P (Signed)
History and Physical    Chloe Harding SHF:[id number] DOB: Oct 09, 1945 DOA: 12/23/2019  PCP: [name], NP (Confirm with patient/family/NH records and if not entered, this has to be entered at [facility] point of entry) Patient coming from: home  I have personally briefly reviewed patient's old medical records in [place]  Chief Complaint: weakness and falls  HPI: Chloe Harding is a 74 y.o. female with medical history significant of diabetes, hypertension, hypercholesterolemia, CAD, CHF, CKD who presents for evaluation of multiple complaints. Caveat - encephalopathic,  majority of history from husband-- Patient with generalized weakness x1 week.  Her weakness has caused multiple falls.  She admits to hitting the frontal aspect of her head.  Recently knocked out her front teeth with fall today.  States she is feels too weak to "walk."    Has had some generalized abdominal pain and diarrhea.  Was recently  hospitalized 6 weeks ago for PNA for which she received IV abx, acute on chronic diastolic CHF and anemia with heme positive stools and did require blood transfusion to treat demand ischemia. Work-up for GI bleed was negative.  Denies any bloody or melanotic stools.  Patient denies any sudden onset thunderclap headache, chest pain, shortness of breath or dysuria.  Does states she has a cough which  is chronic in nature. She is fully Covid vaccinated.   Patient has felt warm however does not know she has been running a fever at home.  Denies using aggravating relieving factors.  She has not taken anything for symptoms.  Denies any lower back pain, extremity pain. Patient states she fell today and was unable to get up because she was so weak.   She has intermittent periods of "acting out" and "periods of weird behavior" over the past several months according to her husband but she has no documented hx of dementia.   ED Course: Tmax 100.7, HR 90, BP 151/113, O2 sat 96% RA. Lab reveals  leukocytosis to 13.2 with 69/25/6, Hgb 11.5, Lactic acid #1 1.1, #2 1.1, Cr 2.16 (0.93 11/09/19). Code Sepsis initiated: patient received 1 L IVF- limited due to h/o h/o flash pulmonary edema, Cefriaxone IV and Flagyl. Imaging was unrevealing. TRH called to admit to continue evaluation and treatment.   Review of Systems: As per HPI otherwise 10 point review of systems negative. Patient able to give history   Past Medical History:  Diagnosis Date  . Arthritis   . Depression   . Diabetes mellitus   . Hiatal hernia   . Hypercholesteremia   . Hypertension   . Reflux   . Renal disorder    shutting down 4 years ago    Past Surgical History:  Procedure Laterality Date  . CHOLECYSTECTOMY    . COLONOSCOPY WITH PROPOFOL N/A 05/20/2017   Procedure: COLONOSCOPY WITH PROPOFOL;  Surgeon: Carol Ada, MD;  Location: [facility];  Service: Endoscopy;  Laterality: N/A;  . ESOPHAGOGASTRODUODENOSCOPY N/A 05/19/2017   Procedure: ESOPHAGOGASTRODUODENOSCOPY (EGD);  Surgeon: Juanita Craver, MD;  Location: [facility] ENDOSCOPY;  Service: Endoscopy;  Laterality: N/A;  . ESOPHAGOGASTRODUODENOSCOPY (EGD) WITH PROPOFOL N/A 01/17/2019   Procedure: ESOPHAGOGASTRODUODENOSCOPY (EGD) WITH PROPOFOL;  Surgeon: Carol Ada, MD;  Location: [facility] ENDOSCOPY;  Service: Endoscopy;  Laterality: N/A;  . SAVORY DILATION N/A 01/17/2019   Procedure: SAVORY DILATION;  Surgeon: Carol Ada, MD;  Location: [facility] ENDOSCOPY;  Service: Endoscopy;  Laterality: N/A;   Soc Hx -  From husband: married 63 years, second marriage. Two children, 3 grandchildren. She used  to work in day care and misc jobs but has been too sick to work for several years. She and her husband live alone but daughter and son-in-law live next door. She has been I-ADLs.   reports that she has never smoked. She has never used smokeless tobacco. She reports that she does not drink alcohol and does not use drugs.  Allergies  Allergen Reactions  . Amlodipine Besylate Swelling     swelling    Family History  Problem Relation Age of Onset  . Heart attack Sister   . Heart failure Sister   . Heart attack Brother   . Heart failure Brother      Prior to Admission medications   Medication Sig Start Date End Date Taking? Authorizing Provider  amitriptyline (ELAVIL) 25 MG tablet Take 25 mg by mouth at bedtime.    [provider]  atorvastatin (LIPITOR) 20 MG tablet Take 1 tablet (20 mg total) by mouth daily. 11/10/19   Amin, Jeanella Flattery, MD  benzonatate (TESSALON) 100 MG capsule Take 100 mg by mouth 4 (four) times daily as needed for cough.  10/25/19   [provider]  bisacodyl (DULCOLAX) 5 MG EC tablet Take 1 tablet (5 mg total) by mouth daily as needed for moderate constipation. 11/09/19   Amin, Jeanella Flattery, MD  CONSTULOSE 10 GM/15ML solution Take 10 g by mouth daily as needed for mild constipation.  10/25/19   [provider]  cyclobenzaprine (FLEXERIL) 10 MG tablet Take 10 mg by mouth 3 (three) times daily. 11/27/19   [provider]  escitalopram (LEXAPRO) 10 MG tablet Take 10 mg by mouth daily. 11/18/19   [provider]  ferrous sulfate 325 (65 FE) MG tablet Take 1 tablet (325 mg total) by mouth 2 (two) times daily with a meal. 11/09/19   Amin, Jeanella Flattery, MD  folic acid (FOLVITE) 1 MG tablet Take 1 mg by mouth daily.    [provider]  furosemide (LASIX) 40 MG tablet Take 1 tablet (40 mg total) by mouth daily. 11/10/19   Amin, Jeanella Flattery, MD  gabapentin (NEURONTIN) 400 MG capsule Take 400 mg by mouth 3 (three) times daily. 10/03/19   [provider]  HYDROcodone-acetaminophen (NORCO/VICODIN) 5-325 MG tablet Take 1 tablet by mouth every 6 (six) hours as needed for moderate pain. 11/09/19   Amin, Jeanella Flattery, MD  hydroxychloroquine (PLAQUENIL) 200 MG tablet Take 200 mg by mouth daily. 10/04/19   [provider]  JANUVIA 100 MG tablet Take 100 mg by mouth daily. 10/02/19   [provider]   metoprolol tartrate (LOPRESSOR) 25 MG tablet Take 1 tablet (25 mg total) by mouth 2 (two) times daily. 11/09/19   Amin, Jeanella Flattery, MD  pantoprazole (PROTONIX) 40 MG tablet Take 1 tablet (40 mg total) by mouth 2 (two) times daily. 01/18/19   Mariel Aloe, MD  polyethylene glycol (MIRALAX / GLYCOLAX) 17 g packet Take 17 g by mouth daily as needed for moderate constipation or severe constipation. 11/09/19   Amin, Jeanella Flattery, MD  potassium chloride (KLOR-CON) 10 MEQ tablet Take 10 mEq by mouth daily. 10/25/19   [provider]  ropinirole (REQUIP) 5 MG tablet Take 5 mg by mouth at bedtime. 10/02/19   [provider]  senna-docusate (SENOKOT-S) 8.6-50 MG tablet Take 1 tablet by mouth at bedtime. 11/09/19   Damita Lack, MD    Physical Exam: Vitals:   12/23/19 1830 12/23/19 1839 12/23/19 1842 12/23/19 1934  BP: Marland Kitchen)  118/94 (!) 118/94  (!) 151/113  Pulse:  (!) 114  (!) 116  Resp:  (!) 22  (!) 21  Temp:   99.6 F (37.6 C)   TempSrc:      SpO2:  97%  96%  Weight:      Height:        Constitutional: NAD, calm, comfortable Vitals:   12/23/19 1830 12/23/19 1839 12/23/19 1842 12/23/19 1934  BP: (!) 118/94 (!) 118/94  (!) 151/113  Pulse:  (!) 114  (!) 116  Resp:  (!) 22  (!) 21  Temp:   99.6 F (37.6 C)   TempSrc:      SpO2:  97%  96%  Weight:      Height:       General - older woman looking older than chronologic age who is restless and difficult to keep awake and who appears encephalopathic. Eyes: PERRL, lids and conjunctivae normal ENMT: Mucous membranes are very dry. Lips are caked and dry. Missing upper teeth. Tender on exam of oral cavity. Neck: normal, supple, no masses, no thyromegaly Respiratory: clear to auscultation bilaterally, no wheezing, no crackles. Normal respiratory effort. No accessory muscle use.  Cardiovascular: Regular rate and rhythm, no murmurs / rubs / gallops. No peripheral edema. 1+ to trace pedal pulses. No carotid bruits.  Abdomen:  obese, no tenderness, no guarding or rebound, no masses palpated. No hepatosplenomegaly. Bowel sounds positive.  Musculoskeletal: no clubbing / cyanosis. No joint deformity upper and lower extremities. Good ROM, no contractures. Decreased muscle tone.  Skin: many old bruises UEs. No open wounds. Sacrum not examined Neurologic: Patient somnolent and appears encephalopathic when awake with minimal speech. CN - normal facial symmetry, PERRLA, EOMI. UE weak to flaccid but offers some resistance when stimulated.  Psychiatric: Cannot assess: patient somnolent, non-verbal. Had been agitated and has received benzodiapines    Labs on Admission: I have personally reviewed following labs and imaging studies  CBC: Recent Labs  Lab 12/23/19 1325  WBC 13.2*  NEUTROABS 9.1*  HGB 11.5*  HCT 35.9*  MCV 84.1  PLT 272   Basic Metabolic Panel: Recent Labs  Lab 12/23/19 1325  NA 134*  K 4.7  CL 98  CO2 26  GLUCOSE 105*  BUN 41*  CREATININE 2.16*  CALCIUM 9.5   GFR: Estimated Creatinine Clearance: 22.4 mL/min (A) (by C-G formula based on SCr of 2.16 mg/dL (H)). Liver Function Tests: Recent Labs  Lab 12/23/19 1325  AST 18  ALT 10  ALKPHOS 121  BILITOT 0.9  PROT 6.9  ALBUMIN 3.5   Recent Labs  Lab 12/23/19 1420  LIPASE 22   No results for input(s): AMMONIA in the last 168 hours. Coagulation Profile: Recent Labs  Lab 12/23/19 1420  INR 1.3*   Cardiac Enzymes: No results for input(s): CKTOTAL, CKMB, CKMBINDEX, TROPONINI in the last 168 hours. BNP (last 3 results) No results for input(s): PROBNP in the last 8760 hours. HbA1C: No results for input(s): HGBA1C in the last 72 hours. CBG: Recent Labs  Lab 12/23/19 2032  GLUCAP 74   Lipid Profile: No results for input(s): CHOL, HDL, LDLCALC, TRIG, CHOLHDL, LDLDIRECT in the last 72 hours. Thyroid Function Tests: No results for input(s): TSH, T4TOTAL, FREET4, T3FREE, THYROIDAB in the last 72 hours. Anemia Panel: No results  for input(s): VITAMINB12, FOLATE, FERRITIN, TIBC, IRON, RETICCTPCT in the last 72 hours. Urine analysis:    Component Value Date/Time   COLORURINE YELLOW 12/23/2019 1330   APPEARANCEUR CLEAR 12/23/2019 1330  LABSPEC 1.014 12/23/2019 1330   PHURINE 5.0 12/23/2019 1330   GLUCOSEU NEGATIVE 12/23/2019 1330   HGBUR NEGATIVE 12/23/2019 1330   HGBUR trace-intact 05/14/2010 1402   BILIRUBINUR NEGATIVE 12/23/2019 1330   KETONESUR NEGATIVE 12/23/2019 1330   PROTEINUR NEGATIVE 12/23/2019 1330   UROBILINOGEN 1.0 03/30/2015 1540   NITRITE NEGATIVE 12/23/2019 1330   LEUKOCYTESUR NEGATIVE 12/23/2019 1330    Radiological Exams on Admission: CT Abdomen Pelvis Wo Contrast  Result Date: 12/23/2019 CLINICAL DATA:  Confused and  unstable EXAM: CT ABDOMEN AND PELVIS WITHOUT CONTRAST TECHNIQUE: Multidetector CT imaging of the abdomen and pelvis was performed following the standard protocol without IV contrast. COMPARISON:  None. FINDINGS: Lower chest: Lung bases are clear. Hepatobiliary: No focal hepatic lesion. Postcholecystectomy. No biliary dilatation. Pancreas: Pancreas is normal. No ductal dilatation. No pancreatic inflammation. Spleen: Normal spleen Adrenals/urinary tract: Adrenal glands and kidneys are normal. The ureters and bladder normal. Stomach/Bowel: Small hiatal hernia. Stomach, duodenum and small-bowel normal. Ascending, transverse and descending colon normal. Vascular/Lymphatic: Abdominal aorta is normal caliber with atherosclerotic calcification. There is no retroperitoneal or periportal lymphadenopathy. No pelvic lymphadenopathy. Reproductive: Post hysterectomy.  Adnexa unremarkable Other: No free fluid. Musculoskeletal: Degenerative changes of the spine. No acute findings. IMPRESSION: 1. No acute abdominopelvic findings on noncontrast exam. 2. No bowel obstruction or inflammation identified. 3.  Aortic Atherosclerosis (ICD10-I70.0). Electronically Signed   By: Suzy Bouchard M.D.   On: 12/23/2019  16:44   DG Chest 2 View  Result Date: 12/23/2019 CLINICAL DATA:  Multiple falls EXAM: CHEST - 2 VIEW COMPARISON:  Radiograph 11/07/2019 FINDINGS: Normal cardiac silhouette. Low lung volumes. Chronic elevation RIGHT hemidiaphragm. No effusion, infiltrate, or pneumothorax. No acute osseous abnormality. IMPRESSION: 1. No acute cardiopulmonary findings.  No evidence trauma. 2. Low lung volumes similar to prior. Electronically Signed   By: Suzy Bouchard M.D.   On: 12/23/2019 14:47   CT Head Wo Contrast  Result Date: 12/23/2019 CLINICAL DATA:  Multiple recent falls including today with left forehead injury. Posterior neck pain. EXAM: CT HEAD WITHOUT CONTRAST CT CERVICAL SPINE WITHOUT CONTRAST TECHNIQUE: Multidetector CT imaging of the head and cervical spine was performed following the standard protocol without intravenous contrast. Multiplanar CT image reconstructions of the cervical spine were also generated. COMPARISON:  05/18/2017 head and cervical spine CT. FINDINGS: CT HEAD FINDINGS Brain: No evidence of parenchymal hemorrhage or extra-axial fluid collection. No mass lesion, mass effect, or midline shift. No CT evidence of acute infarction. Nonspecific moderate subcortical and periventricular white matter hypodensity, most in keeping with chronic small vessel ischemic change. Cerebral volume is age appropriate. No ventriculomegaly. Vascular: No acute abnormality. Skull: No evidence of calvarial fracture. Sinuses/Orbits: Tiny fluid level in the left maxillary sinus. Other:  The mastoid air cells are unopacified. CT CERVICAL SPINE FINDINGS Alignment: Straightening of the cervical spine. No facet subluxation. Dens is well positioned between the lateral masses of C1. Skull base and vertebrae: No acute fracture. No primary bone lesion or focal pathologic process. Soft tissues and spinal canal: No prevertebral edema. No visible canal hematoma. Disc levels: Mild multilevel degenerative disc disease, most prominent  at C5-6. Mild bilateral facet arthropathy. No significant degenerative foraminal stenosis. Upper chest: No acute abnormality. Other: Visualized mastoid air cells appear clear. Hypodense stable 3.1 cm right thyroid nodule. No pathologically enlarged cervical nodes. IMPRESSION: 1. No evidence of acute intracranial abnormality. No evidence of calvarial fracture. 2. Moderate chronic small vessel ischemic changes in the cerebral white matter. 3. No cervical spine fracture or subluxation.  4. Mild multilevel degenerative changes in the cervical spine as detailed. 5. Tiny fluid level in the left maxillary sinus, nonspecific, correlate for acute sinusitis. 6. Stable 3.1 cm right thyroid nodule. Recommend thyroid US if not previously performed (ref: J Am Coll Radiol. 2015 Feb;12(2): 143-50). Electronically Signed   By: Ilona Sorrel M.D.   On: 12/23/2019 14:42   CT Cervical Spine Wo Contrast  Result Date: 12/23/2019 CLINICAL DATA:  Multiple recent falls including today with left forehead injury. Posterior neck pain. EXAM: CT HEAD WITHOUT CONTRAST CT CERVICAL SPINE WITHOUT CONTRAST TECHNIQUE: Multidetector CT imaging of the head and cervical spine was performed following the standard protocol without intravenous contrast. Multiplanar CT image reconstructions of the cervical spine were also generated. COMPARISON:  05/18/2017 head and cervical spine CT. FINDINGS: CT HEAD FINDINGS Brain: No evidence of parenchymal hemorrhage or extra-axial fluid collection. No mass lesion, mass effect, or midline shift. No CT evidence of acute infarction. Nonspecific moderate subcortical and periventricular white matter hypodensity, most in keeping with chronic small vessel ischemic change. Cerebral volume is age appropriate. No ventriculomegaly. Vascular: No acute abnormality. Skull: No evidence of calvarial fracture. Sinuses/Orbits: Tiny fluid level in the left maxillary sinus. Other:  The mastoid air cells are unopacified. CT CERVICAL SPINE  FINDINGS Alignment: Straightening of the cervical spine. No facet subluxation. Dens is well positioned between the lateral masses of C1. Skull base and vertebrae: No acute fracture. No primary bone lesion or focal pathologic process. Soft tissues and spinal canal: No prevertebral edema. No visible canal hematoma. Disc levels: Mild multilevel degenerative disc disease, most prominent at C5-6. Mild bilateral facet arthropathy. No significant degenerative foraminal stenosis. Upper chest: No acute abnormality. Other: Visualized mastoid air cells appear clear. Hypodense stable 3.1 cm right thyroid nodule. No pathologically enlarged cervical nodes. IMPRESSION: 1. No evidence of acute intracranial abnormality. No evidence of calvarial fracture. 2. Moderate chronic small vessel ischemic changes in the cerebral white matter. 3. No cervical spine fracture or subluxation. 4. Mild multilevel degenerative changes in the cervical spine as detailed. 5. Tiny fluid level in the left maxillary sinus, nonspecific, correlate for acute sinusitis. 6. Stable 3.1 cm right thyroid nodule. Recommend thyroid US if not previously performed (ref: J Am Coll Radiol. 2015 Feb;12(2): 143-50). Electronically Signed   By: Ilona Sorrel M.D.   On: 12/23/2019 14:42    EKG: Independently reviewed. Sinus tachycardia w/o acute changes  Assessment/Plan Active Problems:   Sepsis (HCC)   Diarrhea   Type 2 diabetes mellitus (HCC)   HYPERTENSION, BENIGN ESSENTIAL   CHRONIC KIDNEY DISEASE STAGE I   ANEMIA    1. Sepsis - patient with fever, leukocytosis, AKI, watery diarrhea. No definite source of infection identified. Plan Admit to stepdown  Continue high dose ceftriaxone and flagyl for possible intra-abdominal infection  F/u CBC, Bmet in AM  2. AKI - Cr elevated from baseline, most likely 2/2 marked dehydration. Per husband she has taken food or fluids for 48 hrs. She received 1 L IVF in ED Plan Hydrate with 1/2 NS at 75 cc/hr - close  monitoring with chronic diastolic HF  F/u Bmet in AM  3. DM - last A1C 5.6% 11/06/19. Plan Continue home medications  Sliding scale coverage  4. Anemia - pre-existing problem with prior GI workup. Does have heme positive stool but this is chroinic. Hgb is stable Plan F/u CBC in AM  5. Diarrhea - husband reports watery diarrhea w/o frank blood. C.Diff and stool panel pending. Plan Continue Flagyl  6. HTN - elevated BP in ED, suspect 2/2 not taking medications Plan Continue home meds  7. Cardiovascular - recent hospitalization for acute onf chronic HFpEF and had recent outpatient followup. Does not appear decompensated or fluid overloaded at admission exam Plan Continue home meds  Step-down admission  8. AMS - patient with acute mental status change - encephalopathy. Per her husband she has a h/o intermittent changes in behavior suggestive of possible dementia not formerly diagnosed now with acute changes 2/2 acute medical condition Plan PRN ativan for agititation  Once sepsis clears - reassess mental status.   9. Code status - discussed with husband - full code  10. Disposition - TBD  DVT prophylaxis: lovenox  Code Status: full code  Family Communication: spoke with Sharalyn Ink - husband. Aprised him of her serious condition and the treatment plan. He will have his daughter see her tomorrow and she may shed more light on how she has been over the preceeding several days.   Disposition Plan: TBD  Consults called: none Admission status: stepdown    Adella Hare MD [facility] Pager [phone]  If 7PM-7AM, please contact night-coverage [email or web address] Password [facility]  12/23/2019, 8:33 PM

## 2019-12-23 NOTE — ED Provider Notes (Addendum)
[facility] DEPT Provider Note   CSN: [id number] Arrival date & time: 12/23/19  1156     History Chief Complaint  Patient presents with  . Fall  . Head Injury   Chloe Harding is a 74 y.o. female with medical history significant for diabetes, hypertension, hypercholesterolemia, CAD, CHF, CKD who presents for evaluation of multiple complaints.    Majority of history from husband--   Patient with generalized weakness x1 week.  Her weakness has caused multiple falls.  She admits to hitting the frontal aspect of her head.  Recently knocked out her front teeth with fall today.  States she is feels too weak to "walk."  Has had some generalized abdominal pain and diarrhea.  Was recently on antibiotics approximately 6 weeks ago for.  No prior history of C. difficile.  Denies any bloody or melanotic stools.  Patient denies any sudden onset thunderclap headache, chest pain, shortness of breath or dysuria.  Does states she has a cough however states she thinks this is chronic in nature.  She did get both Covid vaccines.  With prior admission 6 weeks ago she did require blood transfusion, work-up for GI bleed was negative.  Patient has felt warm however does not know she has been running a fever at home.  Denies using aggravating relieving factors.  She has not take anything for symptoms.  Denies any lower back pain, extremity pain. Patient states she fell today and was unable to get up because she was so weak.   History obtained from patient, Family in room and past medical records. No interpreter used.  Husband with her states patient with frequent falls and diarrhea over last week. Has intermittent periods of "acting out" and "periods of weird behavior" over months. No documented hx of dementia.  HPI     Past Medical History:  Diagnosis Date  . Arthritis   . Depression   . Diabetes mellitus   . Hiatal hernia   . Hypercholesteremia   . Hypertension   . Reflux    . Renal disorder    shutting down 4 years ago    Patient Active Problem List   Diagnosis Date Noted  . Diarrhea 12/23/2019  . SOB (shortness of breath)   . Demand ischemia ([place])   . CAD in native artery   . Pneumonia 11/05/2019  . Hyponatremia 11/05/2019  . Acute CHF (congestive heart failure) ([place]) 11/05/2019  . Esophageal stricture   . Dysphagia 01/15/2019  . Symptomatic anemia 05/18/2017  . Acute-on-chronic kidney injury ([place]) 05/18/2017  . Hypertension 05/18/2017  . Hiatal hernia 05/18/2017  . History of esophageal stricture 05/18/2017  . Pure hypercholesterolemia 05/18/2017  . Diabetes mellitus 05/18/2017  . Solitary thyroid nodule 05/18/2017  . Hypotension 03/30/2015  . Sepsis ([place]) 03/30/2015  . CAP (community acquired pneumonia) 03/30/2015  . Hyperkalemia 03/30/2015  . Mild renal insufficiency 03/30/2015  . Nausea with vomiting 03/30/2015  . Diabetic neuropathy ([place]) 03/30/2015  . OBESITY 03/11/2010  . Type 2 diabetes mellitus ([place]) 12/26/2009  . Dyslipidemia 12/26/2009  . ANEMIA 12/26/2009  . DEPRESSION 12/26/2009  . HYPERTENSION, BENIGN ESSENTIAL 12/26/2009  . GERD 12/26/2009  . HIATAL HERNIA 12/26/2009  . CHRONIC KIDNEY DISEASE STAGE I 12/26/2009  . UTI 12/26/2009  . ARTHRITIS 12/26/2009  . ARTHROSCOPY, RIGHT KNEE, HX OF 12/26/2009    Past Surgical History:  Procedure Laterality Date  . CHOLECYSTECTOMY    . COLONOSCOPY WITH PROPOFOL N/A 05/20/2017   Procedure: COLONOSCOPY WITH PROPOFOL;  Surgeon: Carol Ada, MD;  Location: [facility];  Service: Endoscopy;  Laterality: N/A;  . ESOPHAGOGASTRODUODENOSCOPY N/A 05/19/2017   Procedure: ESOPHAGOGASTRODUODENOSCOPY (EGD);  Surgeon: Juanita Craver, MD;  Location: [facility] ENDOSCOPY;  Service: Endoscopy;  Laterality: N/A;  . ESOPHAGOGASTRODUODENOSCOPY (EGD) WITH PROPOFOL N/A 01/17/2019   Procedure: ESOPHAGOGASTRODUODENOSCOPY (EGD) WITH PROPOFOL;  Surgeon: Carol Ada, MD;  Location: [facility] ENDOSCOPY;  Service: Endoscopy;   Laterality: N/A;  . SAVORY DILATION N/A 01/17/2019   Procedure: SAVORY DILATION;  Surgeon: Carol Ada, MD;  Location: [facility] ENDOSCOPY;  Service: Endoscopy;  Laterality: N/A;     OB History   No obstetric history on file.     Family History  Problem Relation Age of Onset  . Heart attack Sister   . Heart failure Sister   . Heart attack Brother   . Heart failure Brother     Social History   Tobacco Use  . Smoking status: Never Smoker  . Smokeless tobacco: Never Used  Vaping Use  . Vaping Use: Never used  Substance Use Topics  . Alcohol use: No  . Drug use: No    Home Medications Prior to Admission medications   Medication Sig Start Date End Date Taking? Authorizing Provider  amitriptyline (ELAVIL) 25 MG tablet Take 25 mg by mouth at bedtime.    [provider]  atorvastatin (LIPITOR) 20 MG tablet Take 1 tablet (20 mg total) by mouth daily. 11/10/19   Amin, Jeanella Flattery, MD  benzonatate (TESSALON) 100 MG capsule Take 100 mg by mouth 4 (four) times daily as needed for cough.  10/25/19   [provider]  bisacodyl (DULCOLAX) 5 MG EC tablet Take 1 tablet (5 mg total) by mouth daily as needed for moderate constipation. 11/09/19   Amin, Jeanella Flattery, MD  CONSTULOSE 10 GM/15ML solution Take 10 g by mouth daily as needed for mild constipation.  10/25/19   [provider]  cyclobenzaprine (FLEXERIL) 10 MG tablet Take 10 mg by mouth 3 (three) times daily. 11/27/19   [provider]  escitalopram (LEXAPRO) 10 MG tablet Take 10 mg by mouth daily. 11/18/19   [provider]  ferrous sulfate 325 (65 FE) MG tablet Take 1 tablet (325 mg total) by mouth 2 (two) times daily with a meal. 11/09/19   Amin, Jeanella Flattery, MD  folic acid (FOLVITE) 1 MG tablet Take 1 mg by mouth daily.    [provider]  furosemide (LASIX) 40 MG tablet Take 1 tablet (40 mg total) by mouth daily. 11/10/19   Amin, Jeanella Flattery, MD  gabapentin (NEURONTIN) 400 MG capsule Take 400  mg by mouth 3 (three) times daily. 10/03/19   [provider]  HYDROcodone-acetaminophen (NORCO/VICODIN) 5-325 MG tablet Take 1 tablet by mouth every 6 (six) hours as needed for moderate pain. 11/09/19   Amin, Jeanella Flattery, MD  hydroxychloroquine (PLAQUENIL) 200 MG tablet Take 200 mg by mouth daily. 10/04/19   [provider]  JANUVIA 100 MG tablet Take 100 mg by mouth daily. 10/02/19   [provider]  metoprolol tartrate (LOPRESSOR) 25 MG tablet Take 1 tablet (25 mg total) by mouth 2 (two) times daily. 11/09/19   Amin, Jeanella Flattery, MD  pantoprazole (PROTONIX) 40 MG tablet Take 1 tablet (40 mg total) by mouth 2 (two) times daily. 01/18/19   Mariel Aloe, MD  polyethylene glycol (MIRALAX / GLYCOLAX) 17 g packet Take 17 g by mouth daily as needed for moderate constipation or severe constipation. 11/09/19   Amin, Ankit  Chirag, MD  potassium chloride (KLOR-CON) 10 MEQ tablet Take 10 mEq by mouth daily. 10/25/19   [provider]  ropinirole (REQUIP) 5 MG tablet Take 5 mg by mouth at bedtime. 10/02/19   [provider]  senna-docusate (SENOKOT-S) 8.6-50 MG tablet Take 1 tablet by mouth at bedtime. 11/09/19   Damita Lack, MD    Allergies    Amlodipine besylate  Review of Systems   Review of Systems  Constitutional: Positive for activity change, appetite change, fatigue and fever (Subjective).  HENT: Negative.   Respiratory: Positive for cough. Negative for apnea, choking, chest tightness, shortness of breath, wheezing and stridor.   Cardiovascular: Negative.   Gastrointestinal: Positive for abdominal pain, diarrhea and nausea. Negative for abdominal distention, anal bleeding, blood in stool, constipation, rectal pain and vomiting.  Genitourinary: Negative.   Musculoskeletal: Negative.   Skin: Negative.   Neurological: Negative.   All other systems reviewed and are negative.   Physical Exam Updated Vital Signs BP 121/71 (BP Location: Left Arm)    Pulse (!) 113   Temp 99.5 F (37.5 C) (Oral)   Resp (!) 28   Ht 5\' 1"  (1.549 m)   Wt 79.4 kg   SpO2 97%   BMI 33.07 kg/m   Physical Exam Vitals and nursing note reviewed.  Constitutional:      General: She is not in acute distress.    Appearance: She is well-developed. She is obese. She is ill-appearing (Chronically ill appearing). She is not toxic-appearing or diaphoretic.  HENT:     Head: Normocephalic and atraumatic.     Jaw: There is normal jaw occlusion.     Mouth/Throat:     Mouth: Mucous membranes are dry.     Dentition: Abnormal dentition. Dental caries present.     Comments: Dry mucous membranes.  Tongue midline.  Missing left front tooth to maxillary region.  No active bleeding.  No evidence of intraoral lacerations. Eyes:     Extraocular Movements: Extraocular movements intact.     Pupils: Pupils are equal, round, and reactive to light.     Comments: EOMs intact. PERRLA  Neck:     Trachea: Phonation normal.     Comments: Declined c-collar.  No neck stiffness or neck rigidity Cardiovascular:     Rate and Rhythm: Normal rate and regular rhythm.     Pulses: Normal pulses.          Radial pulses are 2+ on the right side and 2+ on the left side.       Dorsalis pedis pulses are 2+ on the right side and 2+ on the left side.     Heart sounds: Normal heart sounds.  Pulmonary:     Effort: Pulmonary effort is normal. No respiratory distress.     Breath sounds: Normal breath sounds and air entry.     Comments: Speaks in full sentences without difficulty Chest:     Comments: Equal rise and fall of chest wall.  No tenderness, crepitus Abdominal:     General: Bowel sounds are normal. There is no distension.     Palpations: Abdomen is soft.     Tenderness: There is generalized abdominal tenderness. There is guarding. There is no right CVA tenderness, left CVA tenderness or rebound. Negative signs include Murphy's sign and McBurney's sign.     Hernia: No hernia is present.      Comments: Diffuse tenderness with guarding without rebound  Musculoskeletal:        General:  Normal range of motion.     Cervical back: Full passive range of motion without pain, normal range of motion and neck supple.     Comments: Moves all 4 extremities without difficulty.  Compartments soft.  Able to flex and extend at bilateral upper and lower extremities.  Able to straight leg raise bilaterally.  No shortening or rotation of legs.  No midline spinal tenderness, crepitus or step-offs.  Skin:    General: Skin is warm and dry.     Comments: Contusion to left dorsal aspect of hand.  No active bleeding or drainage.  No erythema or warmth.  Neurological:     Mental Status: She is alert.     Cranial Nerves: Cranial nerves are intact.     Sensory: Sensation is intact.     Motor: Weakness present.     Comments: Cranial nerves II through XII grossly intact AxO x3.  Originally states year is 2007 however quickly corrects to 2021 4/5 strength to BL upper and lower extremities Trying to get out of bed. Keeps grabbing at extremities    ED Results / Procedures / Treatments   Labs (all labs ordered are listed, but only abnormal results are displayed) Labs Reviewed  CBC WITH DIFFERENTIAL/PLATELET - Abnormal; Notable for the following components:      Result Value   WBC 13.2 (*)    Hemoglobin 11.5 (*)    HCT 35.9 (*)    RDW 18.3 (*)    Neutro Abs 9.1 (*)    All other components within normal limits  COMPREHENSIVE METABOLIC PANEL - Abnormal; Notable for the following components:   Sodium 134 (*)    Glucose, Bld 105 (*)    BUN 41 (*)    Creatinine, Ser 2.16 (*)    GFR calc non Af Amer 22 (*)    GFR calc Af Amer 26 (*)    All other components within normal limits  PROTIME-INR - Abnormal; Notable for the following components:   Prothrombin Time 15.5 (*)    INR 1.3 (*)    All other components within normal limits  POC OCCULT BLOOD, ED - Abnormal; Notable for the following components:    Fecal Occult Bld POSITIVE (*)    All other components within normal limits  SARS CORONAVIRUS 2 BY RT PCR (HOSPITAL ORDER, [facility])  CULTURE, BLOOD (ROUTINE X 2)  CULTURE, BLOOD (ROUTINE X 2)  URINE CULTURE  C DIFFICILE QUICK SCREEN W PCR REFLEX  GASTROINTESTINAL PANEL BY PCR, STOOL (REPLACES STOOL CULTURE)  URINALYSIS, ROUTINE W REFLEX MICROSCOPIC  LACTIC ACID, PLASMA  LACTIC ACID, PLASMA  APTT  LIPASE, BLOOD  RAPID URINE DRUG SCREEN, HOSP PERFORMED  BASIC METABOLIC PANEL  CBC  CBG MONITORING, ED  TYPE AND SCREEN    EKG None  Radiology CT Abdomen Pelvis Wo Contrast  Result Date: 12/23/2019 CLINICAL DATA:  Confused and  unstable EXAM: CT ABDOMEN AND PELVIS WITHOUT CONTRAST TECHNIQUE: Multidetector CT imaging of the abdomen and pelvis was performed following the standard protocol without IV contrast. COMPARISON:  None. FINDINGS: Lower chest: Lung bases are clear. Hepatobiliary: No focal hepatic lesion. Postcholecystectomy. No biliary dilatation. Pancreas: Pancreas is normal. No ductal dilatation. No pancreatic inflammation. Spleen: Normal spleen Adrenals/urinary tract: Adrenal glands and kidneys are normal. The ureters and bladder normal. Stomach/Bowel: Small hiatal hernia. Stomach, duodenum and small-bowel normal. Ascending, transverse and descending colon normal. Vascular/Lymphatic: Abdominal aorta is normal caliber with atherosclerotic calcification. There is no retroperitoneal or periportal  lymphadenopathy. No pelvic lymphadenopathy. Reproductive: Post hysterectomy.  Adnexa unremarkable Other: No free fluid. Musculoskeletal: Degenerative changes of the spine. No acute findings. IMPRESSION: 1. No acute abdominopelvic findings on noncontrast exam. 2. No bowel obstruction or inflammation identified. 3.  Aortic Atherosclerosis (ICD10-I70.0). Electronically Signed   By: Suzy Bouchard M.D.   On: 12/23/2019 16:44   DG Chest 2 View  Result Date:  12/23/2019 CLINICAL DATA:  Multiple falls EXAM: CHEST - 2 VIEW COMPARISON:  Radiograph 11/07/2019 FINDINGS: Normal cardiac silhouette. Low lung volumes. Chronic elevation RIGHT hemidiaphragm. No effusion, infiltrate, or pneumothorax. No acute osseous abnormality. IMPRESSION: 1. No acute cardiopulmonary findings.  No evidence trauma. 2. Low lung volumes similar to prior. Electronically Signed   By: Suzy Bouchard M.D.   On: 12/23/2019 14:47   CT Head Wo Contrast  Result Date: 12/23/2019 CLINICAL DATA:  Multiple recent falls including today with left forehead injury. Posterior neck pain. EXAM: CT HEAD WITHOUT CONTRAST CT CERVICAL SPINE WITHOUT CONTRAST TECHNIQUE: Multidetector CT imaging of the head and cervical spine was performed following the standard protocol without intravenous contrast. Multiplanar CT image reconstructions of the cervical spine were also generated. COMPARISON:  05/18/2017 head and cervical spine CT. FINDINGS: CT HEAD FINDINGS Brain: No evidence of parenchymal hemorrhage or extra-axial fluid collection. No mass lesion, mass effect, or midline shift. No CT evidence of acute infarction. Nonspecific moderate subcortical and periventricular white matter hypodensity, most in keeping with chronic small vessel ischemic change. Cerebral volume is age appropriate. No ventriculomegaly. Vascular: No acute abnormality. Skull: No evidence of calvarial fracture. Sinuses/Orbits: Tiny fluid level in the left maxillary sinus. Other:  The mastoid air cells are unopacified. CT CERVICAL SPINE FINDINGS Alignment: Straightening of the cervical spine. No facet subluxation. Dens is well positioned between the lateral masses of C1. Skull base and vertebrae: No acute fracture. No primary bone lesion or focal pathologic process. Soft tissues and spinal canal: No prevertebral edema. No visible canal hematoma. Disc levels: Mild multilevel degenerative disc disease, most prominent at C5-6. Mild bilateral facet  arthropathy. No significant degenerative foraminal stenosis. Upper chest: No acute abnormality. Other: Visualized mastoid air cells appear clear. Hypodense stable 3.1 cm right thyroid nodule. No pathologically enlarged cervical nodes. IMPRESSION: 1. No evidence of acute intracranial abnormality. No evidence of calvarial fracture. 2. Moderate chronic small vessel ischemic changes in the cerebral white matter. 3. No cervical spine fracture or subluxation. 4. Mild multilevel degenerative changes in the cervical spine as detailed. 5. Tiny fluid level in the left maxillary sinus, nonspecific, correlate for acute sinusitis. 6. Stable 3.1 cm right thyroid nodule. Recommend thyroid US if not previously performed (ref: J Am Coll Radiol. 2015 Feb;12(2): 143-50). Electronically Signed   By: Ilona Sorrel M.D.   On: 12/23/2019 14:42   CT Cervical Spine Wo Contrast  Result Date: 12/23/2019 CLINICAL DATA:  Multiple recent falls including today with left forehead injury. Posterior neck pain. EXAM: CT HEAD WITHOUT CONTRAST CT CERVICAL SPINE WITHOUT CONTRAST TECHNIQUE: Multidetector CT imaging of the head and cervical spine was performed following the standard protocol without intravenous contrast. Multiplanar CT image reconstructions of the cervical spine were also generated. COMPARISON:  05/18/2017 head and cervical spine CT. FINDINGS: CT HEAD FINDINGS Brain: No evidence of parenchymal hemorrhage or extra-axial fluid collection. No mass lesion, mass effect, or midline shift. No CT evidence of acute infarction. Nonspecific moderate subcortical and periventricular white matter hypodensity, most in keeping with chronic small vessel ischemic change. Cerebral volume is age appropriate. No ventriculomegaly.  Vascular: No acute abnormality. Skull: No evidence of calvarial fracture. Sinuses/Orbits: Tiny fluid level in the left maxillary sinus. Other:  The mastoid air cells are unopacified. CT CERVICAL SPINE FINDINGS Alignment:  Straightening of the cervical spine. No facet subluxation. Dens is well positioned between the lateral masses of C1. Skull base and vertebrae: No acute fracture. No primary bone lesion or focal pathologic process. Soft tissues and spinal canal: No prevertebral edema. No visible canal hematoma. Disc levels: Mild multilevel degenerative disc disease, most prominent at C5-6. Mild bilateral facet arthropathy. No significant degenerative foraminal stenosis. Upper chest: No acute abnormality. Other: Visualized mastoid air cells appear clear. Hypodense stable 3.1 cm right thyroid nodule. No pathologically enlarged cervical nodes. IMPRESSION: 1. No evidence of acute intracranial abnormality. No evidence of calvarial fracture. 2. Moderate chronic small vessel ischemic changes in the cerebral white matter. 3. No cervical spine fracture or subluxation. 4. Mild multilevel degenerative changes in the cervical spine as detailed. 5. Tiny fluid level in the left maxillary sinus, nonspecific, correlate for acute sinusitis. 6. Stable 3.1 cm right thyroid nodule. Recommend thyroid US if not previously performed (ref: J Am Coll Radiol. 2015 Feb;12(2): 143-50). Electronically Signed   By: Ilona Sorrel M.D.   On: 12/23/2019 14:42    Procedures .Critical Care Performed by: Nettie Elm, PA-C Authorized by: Nettie Elm, PA-C   Critical care provider statement:    Critical care time (minutes):  45   Critical care was necessary to treat or prevent imminent or life-threatening deterioration of the following conditions:  Sepsis and renal failure   Critical care was time spent personally by me on the following activities:  Discussions with consultants, evaluation of patient's response to treatment, examination of patient, ordering and performing treatments and interventions, ordering and review of laboratory studies, ordering and review of radiographic studies, pulse oximetry, re-evaluation of patient's condition,  obtaining history from patient or surrogate and review of old charts   (including critical care time)  Medications Ordered in ED Medications  HYDROcodone-acetaminophen (NORCO/VICODIN) 5-325 MG per tablet 1 tablet (has no administration in time range)  hydroxychloroquine (PLAQUENIL) tablet 200 mg (has no administration in time range)  atorvastatin (LIPITOR) tablet 20 mg (has no administration in time range)  furosemide (LASIX) tablet 40 mg (has no administration in time range)  metoprolol tartrate (LOPRESSOR) tablet 25 mg (has no administration in time range)  escitalopram (LEXAPRO) tablet 10 mg (has no administration in time range)  linagliptin (TRADJENTA) tablet 5 mg (has no administration in time range)  bisacodyl (DULCOLAX) EC tablet 5 mg (has no administration in time range)  lactulose (CHRONULAC) 10 GM/15ML solution 10 g (has no administration in time range)  pantoprazole (PROTONIX) EC tablet 40 mg (has no administration in time range)  polyethylene glycol (MIRALAX / GLYCOLAX) packet 17 g (has no administration in time range)  senna-docusate (Senokot-S) tablet 1 tablet (has no administration in time range)  ferrous sulfate tablet 325 mg (has no administration in time range)  folic acid (FOLVITE) tablet 1 mg (has no administration in time range)  cyclobenzaprine (FLEXERIL) tablet 10 mg (has no administration in time range)  gabapentin (NEURONTIN) capsule 400 mg (has no administration in time range)  rOPINIRole (REQUIP) tablet 5 mg (has no administration in time range)  potassium chloride (KLOR-CON) CR tablet 10 mEq (has no administration in time range)  enoxaparin (LOVENOX) injection 30 mg (has no administration in time range)  0.45 % sodium chloride infusion ( Intravenous New Bag/Given 12/23/19 2116)  insulin aspart (novoLOG) injection 0-20 Units (has no administration in time range)  cefTRIAXone (ROCEPHIN) 2 g in sodium chloride 0.9 % 100 mL IVPB (has no administration in time range)   metroNIDAZOLE (FLAGYL) IVPB 500 mg (has no administration in time range)  sodium chloride 0.9 % bolus 1,000 mL (0 mLs Intravenous Stopped 12/23/19 1821)  cefTRIAXone (ROCEPHIN) 2 g in sodium chloride 0.9 % 100 mL IVPB (0 g Intravenous Stopped 12/23/19 1554)  metroNIDAZOLE (FLAGYL) IVPB 500 mg (0 mg Intravenous Stopped 12/23/19 1822)  acetaminophen (TYLENOL) tablet 650 mg (650 mg Oral Given 12/23/19 1457)  LORazepam (ATIVAN) injection 1 mg (1 mg Intravenous Given 12/23/19 1516)  LORazepam (ATIVAN) injection 1 mg (1 mg Intravenous Given 12/23/19 1625)  diphenhydrAMINE (BENADRYL) injection 25 mg (25 mg Intravenous Given 12/23/19 1626)   ED Course  I have reviewed the triage vital signs and the nursing notes.  Pertinent labs & imaging results that were available during my care of the patient were reviewed by me and considered in my medical decision making (see chart for details).  74 year old presents for evaluation multiple falls and weakness.  On arrival she is febrile and tachycardic.  Diffuse tenderness and guarding to abdomen.  Multiple episodes of diarrhea at home without melena or bright red blood per rectum per patient, occult positive here, however has been assessed previously for GI bleed.  Moves all 4 extremities without difficulty.  Contusion to left hand without any underlying bony tenderness.  No active bleeding or drainage.  Hit her head with most recent fall today to left frontal region. No cranial nerve deficits.  No urinary complaints.  Does admit to having cough however states this is chronic.  She does not look grossly fluid overloaded.  She does appear clinically dry.  Sepsis called on initial evaluation due to fever, tachycardia and likely source of intra-abdominal infection.  Given antibiotics for likely intra-abdominal source.  Start with 1 L of fluids given last EF 50-55 will we are pending her lactic.  Maps greater than 65.  Plan on labs, imaging and reassess. Patient does have odd affect.  Moving around in bed and constantly touching at her extremities. Husband states patient does intermittently do this. No hx of dementia.  No unilateral weakness I have low suspicion for acute ischemia, or acute cerebellar infarct. However patient currently would not be able to have MR 2/2 unable to stay still.  Labs and imaging personally reviewed and interpreted:  CBC leukocytosis at 13.2, hemoglobin 11.5, improved from her baseline Metabolic panel hyponatremia to 134, hyperglycemia 105, BUN 41, creatinine 2.16, from baseline 0.9 Lactic acid 1.1, low suspicion for ischemic bowel Lipase 22 Covid negative CT head without acute findings CT cervical without acute findings CT abdomen pelvis without acute findings Plain, chest without acute infiltrates, cardiomegaly, pulmonary edema, pneumothorax EKG without STEMI  Went to reassess patient. Unable to stay still for CT AP. Ativan ordered. Itching after Rocephin given Benadryl. No hives or urticaria. Clear airway.  Patient will not allow for cath UA.  Was given 1 L of fluids and small continuous fluids however will hold additional fluids as maps greater than 65, lactic acid less than 4.  Patient appears to have some delirum since ativan administration. No neck stiffness or neck rigidity. Fully moves extremities. No unilateral weakness. I have low suspicion for meningitis, CVA.  Patient reassessed, defervesced with Tylenol.  She is still mildly tachycardic and tachypneic or not hypoxic.  Low suspicion for acute PE as cause of  tachycardia and tachypnea. COVID negative.  No rashes or lesions to suggest skin as source of fever.  Equal pulses bilaterally, tactile temperature to extremities.  Low suspicion for cardiogenic shock  Was able to obtain cath UA which was negative for infection. Culture was sent.  Patient will be admitted for further work-up, AKI. Unsure of cause of fever currently. Per Dr. Kathrynn Humble does not feel we need to obtain LP at this  time.  CONSULT with Dr. Veverly Fells with [facility] who will evaluate patient for admission.   Patient seen eval by attending, Dr. Kathrynn Humble who agrees with treatment,plan and disposition.  The patient appears reasonably stabilized for admission considering the current resources, flow, and capabilities available in the ED at this time, and I doubt any other [facility] requiring further screening and/or treatment in the ED prior to admission.    MDM Rules/Calculators/A&P                           Final Clinical Impression(s) / ED Diagnoses Final diagnoses:  Fall, initial encounter  Weakness  SIRS (systemic inflammatory response syndrome) ([place])    Rx / DC Orders ED Discharge Orders    None       Lennie Dunnigan A, PA-C 12/23/19 2015       Jacque Garrels A, PA-C 12/23/19 2153    Varney Biles, MD 12/24/19 1516

## 2019-12-23 NOTE — ED Notes (Signed)
Assumed care of patient at this time, nad noted, sr up x2, bed locked and low, call bell w/I reach.  Will continue to monitor. ° °

## 2019-12-23 NOTE — ED Notes (Signed)
Pt in radiology. Visitor at bedside.

## 2019-12-24 DIAGNOSIS — E162 Hypoglycemia, unspecified: Secondary | ICD-10-CM

## 2019-12-24 LAB — CBG MONITORING, ED
Glucose-Capillary: 44 mg/dL — CL (ref 70–99)
Glucose-Capillary: 139 mg/dL — ABNORMAL HIGH (ref 70–99)
Glucose-Capillary: 76 mg/dL (ref 70–99)
Glucose-Capillary: 29 mg/dL — CL (ref 70–99)
Glucose-Capillary: 103 mg/dL — ABNORMAL HIGH (ref 70–99)
Glucose-Capillary: 109 mg/dL — ABNORMAL HIGH (ref 70–99)
Glucose-Capillary: 126 mg/dL — ABNORMAL HIGH (ref 70–99)

## 2019-12-24 LAB — VITAMIN D 25 HYDROXY (VIT D DEFICIENCY, FRACTURES): Vit D, 25-Hydroxy: 23.71 ng/mL — ABNORMAL LOW (ref 30–100)

## 2019-12-24 LAB — VITAMIN B12: Vitamin B-12: 404 pg/mL (ref 180–914)

## 2019-12-24 LAB — IRON AND TIBC
Iron: 23 ug/dL — ABNORMAL LOW (ref 28–170)
Saturation Ratios: 13 % (ref 10.4–31.8)
TIBC: 175 ug/dL — ABNORMAL LOW (ref 250–450)
UIBC: 152 ug/dL

## 2019-12-24 LAB — TSH: TSH: 0.902 u[IU]/mL (ref 0.350–4.500)

## 2019-12-24 LAB — CBC
HCT: 27.8 % — ABNORMAL LOW (ref 36.0–46.0)
Hemoglobin: 8.9 g/dL — ABNORMAL LOW (ref 12.0–15.0)
MCH: 27.3 pg (ref 26.0–34.0)
MCHC: 32 g/dL (ref 30.0–36.0)
MCV: 85.3 fL (ref 80.0–100.0)
Platelets: 164 10*3/uL (ref 150–400)
RBC: 3.26 MIL/uL — ABNORMAL LOW (ref 3.87–5.11)
RDW: 18 % — ABNORMAL HIGH (ref 11.5–15.5)
WBC: 6.5 10*3/uL (ref 4.0–10.5)
nRBC: 0 % (ref 0.0–0.2)

## 2019-12-24 LAB — BASIC METABOLIC PANEL
Anion gap: 8 (ref 5–15)
BUN: 39 mg/dL — ABNORMAL HIGH (ref 8–23)
CO2: 21 mmol/L — ABNORMAL LOW (ref 22–32)
Calcium: 8.2 mg/dL — ABNORMAL LOW (ref 8.9–10.3)
Chloride: 107 mmol/L (ref 98–111)
Creatinine, Ser: 1.55 mg/dL — ABNORMAL HIGH (ref 0.44–1.00)
GFR calc Af Amer: 38 mL/min — ABNORMAL LOW (ref >60)
GFR calc non Af Amer: 33 mL/min — ABNORMAL LOW (ref >60)
Glucose, Bld: 63 mg/dL — ABNORMAL LOW (ref 70–99)
Potassium: 3.8 mmol/L (ref 3.5–5.1)
Sodium: 136 mmol/L (ref 135–145)

## 2019-12-24 LAB — GLUCOSE, CAPILLARY
Glucose-Capillary: 185 mg/dL — ABNORMAL HIGH (ref 70–99)
Glucose-Capillary: 133 mg/dL — ABNORMAL HIGH (ref 70–99)

## 2019-12-24 LAB — MRSA PCR SCREENING: MRSA by PCR: NEGATIVE

## 2019-12-24 LAB — POC OCCULT BLOOD, ED: Fecal Occult Bld: POSITIVE — AB

## 2019-12-24 LAB — AMMONIA: Ammonia: 17 umol/L (ref 9–35)

## 2019-12-24 LAB — FERRITIN: Ferritin: 84 ng/mL (ref 11–307)

## 2019-12-24 MED ORDER — DEXTROSE 50 % IV SOLN
INTRAVENOUS | Status: AC
  Filled 2019-12-24: qty 50

## 2019-12-24 MED ORDER — DEXTROSE-NACL 5-0.45 % IV SOLN: INTRAVENOUS | Status: AC

## 2019-12-24 MED ORDER — DEXTROSE 50 % IV SOLN
25.0000 g | Freq: Once | INTRAVENOUS | Status: AC
  Administered 2019-12-24: 25 g via INTRAVENOUS
  Filled 2019-12-24: qty 50

## 2019-12-24 MED ORDER — CALCIUM CARBONATE ANTACID 500 MG PO CHEW
2.0000 | CHEWABLE_TABLET | ORAL | Status: DC | PRN
  Administered 2019-12-24: 400 mg via ORAL
  Filled 2019-12-24: qty 2

## 2019-12-24 MED ORDER — IPRATROPIUM-ALBUTEROL 0.5-2.5 (3) MG/3ML IN SOLN: 3.0000 mL | RESPIRATORY_TRACT | Status: DC | PRN

## 2019-12-24 MED ORDER — GABAPENTIN 400 MG PO CAPS
400.0000 mg | ORAL_CAPSULE | Freq: Three times a day (TID) | ORAL | Status: DC
  Administered 2019-12-24 – 2019-12-27 (×9): 400 mg via ORAL
  Filled 2019-12-24: qty 1
  Filled 2019-12-24: qty 4
  Filled 2019-12-24 (×7): qty 1

## 2019-12-24 MED ORDER — CHLORHEXIDINE GLUCONATE CLOTH 2 % EX PADS
6.0000 | MEDICATED_PAD | Freq: Every day | CUTANEOUS | Status: DC
  Administered 2019-12-24 – 2019-12-27 (×2): 6 via TOPICAL

## 2019-12-24 MED ORDER — DEXTROSE 50 % IV SOLN
INTRAVENOUS | Status: AC
  Administered 2019-12-24: 50 mL
  Filled 2019-12-24: qty 50

## 2019-12-24 MED ORDER — PANTOPRAZOLE SODIUM 40 MG PO TBEC
40.0000 mg | DELAYED_RELEASE_TABLET | Freq: Two times a day (BID) | ORAL | Status: DC
  Administered 2019-12-24 – 2019-12-27 (×6): 40 mg via ORAL
  Filled 2019-12-24 (×6): qty 1

## 2019-12-24 MED ORDER — FAMOTIDINE 20 MG PO TABS
20.0000 mg | ORAL_TABLET | Freq: Two times a day (BID) | ORAL | Status: DC
  Administered 2019-12-24 – 2019-12-27 (×6): 20 mg via ORAL
  Filled 2019-12-24 (×7): qty 1

## 2019-12-24 MED ORDER — GLUCAGON HCL RDNA (DIAGNOSTIC) 1 MG IJ SOLR
1.0000 mg | Freq: Once | INTRAMUSCULAR | Status: DC | PRN
  Filled 2019-12-24: qty 1

## 2019-12-24 MED ORDER — SODIUM CHLORIDE 0.9 % IV SOLN: INTRAVENOUS | Status: DC

## 2019-12-24 MED ORDER — HYDRALAZINE HCL 20 MG/ML IJ SOLN: 10.0000 mg | INTRAMUSCULAR | Status: DC | PRN

## 2019-12-24 NOTE — Progress Notes (Addendum)
PROGRESS NOTE    Chloe Harding  YKD:[id number] DOB: July 15, 1945 DOA: 12/23/2019 PCP: [name], NP   Brief Narrative:  74 year old with history of DM2, HTN, HLD, CAD, CHF, CKD stage IIIa presents with generalized weakness for 1 week and fall.  Admitted 2 months ago for CHF exacerbation/pneumonia and heme positive stools.  GI work-up negative.  Intermittent behavioral disturbances at home.  Upon admission she was febrile, acute kidney injury.  Started on empiric Rocephin and Flagyl.   Assessment & Plan:   Active Problems:   Type 2 diabetes mellitus (HCC)   ANEMIA   HYPERTENSION, BENIGN ESSENTIAL   CHRONIC KIDNEY DISEASE STAGE I   Sepsis ([place])   Diarrhea  SIRS -Unclear etiology at this time -Empiric IV Rocephin and Flagyl -UA-negative, chest x-ray-negative  Acute kidney injury -Baseline creatinine 0.6.  Admission creatinine 2.16, slowly improving with hydration. -IV fluids, monitor urine output.  Bladder scans if necessary -Stop Lasix  Mild to moderate dehydration Watery diarrhea -C. difficile-pending -GI panel-pending -Supportive care, IV fluids. -CT abdomen pelvis-negative for acute pathology  Altered mental status/encephalopathy Falls at home, unsteady gait -CT head/C-spine-moderate chronic small vessel changes, no acute pathology, -Check TSH, ammonia, vitamin D -PT/OT -Possible metabolite of Norco/Flexeril and gabapentin buildup in the setting of AKI.  Hopefully steadiness and mentation will improve as renal function improves.  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  Also give 1 dose of glucagon -Gabapentin 400 mg 3 times daily, perhaps we might need to reduce the dose given  dizziness and falls  Essential hypertension -Metoprolol 25 mg twice daily  Chronic congestive heart failure with reduced ejection fraction, 45% EF, grade 1 diastolic dysfunction -Gentle hydration.  Continue metoprolol.  Supportive care.  Anemia of chronic disease and iron deficiency -Iron supplements  Restless leg syndrome -Ropinirole  Hyperlipidemia -Daily Lipitor  History of depression -Daily Lexapro  Addendum at 2:40 PM: Spoke with the patient's daughter Chloe Harding.  Also patient has positive Hemoccult.  Endoscopy/colonoscopy reports reviewed from Dr. Benson Norway back in 2019.  Apparently patient has been noncompliant with PPI at home.  For now we will follow her hemodynamically as hemoglobin is stable, continue her PPI twice daily.  Check iron studies, B12 folate.  If necessary will consult GI, Dr. Benson Norway during this admission.  Otherwise will definitely arrange for outpatient follow-up with him.    DVT prophylaxis: Lovenox Code Status: Full code Family Communication:    Status is: Inpatient  Remains inpatient appropriate because:Inpatient level of care appropriate due to severity of illness   Dispo: The patient is from: Home              Anticipated d/c is to: Home              Anticipated d/c date is: 3 days              Patient currently is not medically stable to d/c.  Patient is quiet drowsy with recurrent hypoglycemia.  Maintain hospital stay.  Unsafe for discharge       Body mass index is 33.07 kg/m.     Subjective: Patient seen in the ER she is quite obtunded but easily arousable with sternal rub.  Blood glucose checked was about 28, she was given amp of D50.  Rest of the vital signs are overall stable.  She does appear to have good urine output.  Addendum 11:30 AM Patient seen again around 1130, her blood glucose had improved to 103 she was awake alert oriented.  Attempting to eat her breakfast.  Denied any complaints.  Review of Systems Otherwise negative  except as per HPI, including: Difficult to obtain due to her mentation Examination:  Constitutional: Chronically ill-appearing with dry lips and mouth Respiratory: Clear to auscultation bilaterally Cardiovascular: Normal sinus rhythm, no rubs Abdomen: Nontender nondistended good bowel sounds Musculoskeletal: No edema noted Skin: No rashes seen Neurologic: Difficult to assess Psychiatric: Difficult to assess  Objective: Vitals:   12/24/19 0730 12/24/19 0745 12/24/19 0800 12/24/19 0815  BP: (!) 144/88 (!) 144/82 (!) 146/82 (!) 144/78  Pulse: (!) 102 (!) 102 100 (!) 101  Resp: (!) 25 16 17 15   Temp:      TempSrc:      SpO2: 100% 100% 100% 100%  Weight:      Height:        Intake/Output Summary (Last 24 hours) at 12/24/2019 0836 Last data filed at 12/23/2019 2128 Gross per 24 hour  Intake 546.12 ml  Output 125 ml  Net 421.12 ml   Filed Weights   12/23/19 1358  Weight: 79.4 kg     Data Reviewed:   CBC: Recent Labs  Lab 12/23/19 1325 12/24/19 0500  WBC 13.2* 6.5  NEUTROABS 9.1*  --   HGB 11.5* 8.9*  HCT 35.9* 27.8*  MCV 84.1 85.3  PLT 239 379   Basic Metabolic Panel: Recent Labs  Lab 12/23/19 1325 12/24/19 0500  NA 134* 136  K 4.7 3.8  CL 98 107  CO2 26 21*  GLUCOSE 105* 63*  BUN 41* 39*  CREATININE 2.16* 1.55*  CALCIUM 9.5 8.2*   GFR: Estimated Creatinine Clearance: 31.3 mL/min (A) (by C-G formula based on SCr of 1.55 mg/dL (H)). Liver Function Tests: Recent Labs  Lab 12/23/19 1325  AST 18  ALT 10  ALKPHOS 121  BILITOT 0.9  PROT 6.9  ALBUMIN 3.5   Recent Labs  Lab 12/23/19 1420  LIPASE 22   No results for input(s): AMMONIA in the last 168 hours. Coagulation Profile: Recent Labs  Lab 12/23/19 1420  INR 1.3*   Cardiac Enzymes: No results for input(s): CKTOTAL, CKMB, CKMBINDEX, TROPONINI in the last 168 hours. BNP (last 3 results) No results for input(s): PROBNP in the last 8760 hours. HbA1C: No results for input(s): HGBA1C in the  last 72 hours. CBG: Recent Labs  Lab 12/23/19 2032 12/23/19 2237 12/24/19 0628 12/24/19 0647 12/24/19 0801  GLUCAP 74 78 44* 139* 76   Lipid Profile: No results for input(s): CHOL, HDL, LDLCALC, TRIG, CHOLHDL, LDLDIRECT in the last 72 hours. Thyroid Function Tests: No results for input(s): TSH, T4TOTAL, FREET4, T3FREE, THYROIDAB in the last 72 hours. Anemia Panel: No results for input(s): VITAMINB12, FOLATE, FERRITIN, TIBC, IRON, RETICCTPCT in the last 72 hours. Sepsis Labs: Recent Labs  Lab 12/23/19 1408 12/23/19 1608  LATICACIDVEN 1.1 1.1    Recent Results (from the past 240 hour(s))  SARS Coronavirus 2 by RT PCR (hospital order, performed in [facility] hospital lab) Nasopharyngeal Nasopharyngeal Swab     Status: None   Collection Time: 12/23/19  2:08 PM   Specimen: Nasopharyngeal Swab  Result Value Ref Range Status   SARS Coronavirus 2 NEGATIVE NEGATIVE Final    Comment: (NOTE) SARS-CoV-2 target nucleic acids are NOT DETECTED.  The SARS-CoV-2  RNA is generally detectable in upper and lower respiratory specimens during the acute phase of infection. The lowest concentration of SARS-CoV-2 viral copies this assay can detect is 250 copies / mL. A negative result does not preclude SARS-CoV-2 infection and should not be used as the sole basis for treatment or other patient management decisions.  A negative result may occur with improper specimen collection / handling, submission of specimen other than nasopharyngeal swab, presence of viral mutation(s) within the areas targeted by this assay, and inadequate number of viral copies (<250 copies / mL). A negative result must be combined with clinical observations, patient history, and epidemiological information.  Fact Sheet for Patients:   StrictlyIdeas.no  Fact Sheet for Healthcare Providers: BankingDealers.co.za  This test is not yet approved or  cleared by the [place]  FDA and has been authorized for detection and/or diagnosis of SARS-CoV-2 by FDA under an Emergency Use Authorization (EUA).  This EUA will remain in effect (meaning this test can be used) for the duration of the COVID-19 declaration under Section 564(b)(1) of the Act, 21 U.S.C. section 360bbb-3(b)(1), unless the authorization is terminated or revoked sooner.  Performed at [facility], [place] [address], [place], [place] [address]          Radiology Studies: CT Abdomen Pelvis Wo Contrast  Result Date: 12/23/2019 CLINICAL DATA:  Confused and  unstable EXAM: CT ABDOMEN AND PELVIS WITHOUT CONTRAST TECHNIQUE: Multidetector CT imaging of the abdomen and pelvis was performed following the standard protocol without IV contrast. COMPARISON:  None. FINDINGS: Lower chest: Lung bases are clear. Hepatobiliary: No focal hepatic lesion. Postcholecystectomy. No biliary dilatation. Pancreas: Pancreas is normal. No ductal dilatation. No pancreatic inflammation. Spleen: Normal spleen Adrenals/urinary tract: Adrenal glands and kidneys are normal. The ureters and bladder normal. Stomach/Bowel: Small hiatal hernia. Stomach, duodenum and small-bowel normal. Ascending, transverse and descending colon normal. Vascular/Lymphatic: Abdominal aorta is normal caliber with atherosclerotic calcification. There is no retroperitoneal or periportal lymphadenopathy. No pelvic lymphadenopathy. Reproductive: Post hysterectomy.  Adnexa unremarkable Other: No free fluid. Musculoskeletal: Degenerative changes of the spine. No acute findings. IMPRESSION: 1. No acute abdominopelvic findings on noncontrast exam. 2. No bowel obstruction or inflammation identified. 3.  Aortic Atherosclerosis (ICD10-I70.0). Electronically Signed   By: Suzy Bouchard M.D.   On: 12/23/2019 16:44   DG Chest 2 View  Result Date: 12/23/2019 CLINICAL DATA:  Multiple falls EXAM: CHEST - 2 VIEW COMPARISON:  Radiograph 11/07/2019 FINDINGS: Normal  cardiac silhouette. Low lung volumes. Chronic elevation RIGHT hemidiaphragm. No effusion, infiltrate, or pneumothorax. No acute osseous abnormality. IMPRESSION: 1. No acute cardiopulmonary findings.  No evidence trauma. 2. Low lung volumes similar to prior. Electronically Signed   By: Suzy Bouchard M.D.   On: 12/23/2019 14:47   CT Head Wo Contrast  Result Date: 12/23/2019 CLINICAL DATA:  Multiple recent falls including today with left forehead injury. Posterior neck pain. EXAM: CT HEAD WITHOUT CONTRAST CT CERVICAL SPINE WITHOUT CONTRAST TECHNIQUE: Multidetector CT imaging of the head and cervical spine was performed following the standard protocol without intravenous contrast. Multiplanar CT image reconstructions of the cervical spine were also generated. COMPARISON:  05/18/2017 head and cervical spine CT. FINDINGS: CT HEAD FINDINGS Brain: No evidence of parenchymal hemorrhage or extra-axial fluid collection. No mass lesion, mass effect, or midline shift. No CT evidence of acute infarction. Nonspecific moderate subcortical and periventricular white matter hypodensity, most in keeping with chronic small vessel ischemic change. Cerebral volume is age appropriate. No ventriculomegaly. Vascular: No acute  abnormality. Skull: No evidence of calvarial fracture. Sinuses/Orbits: Tiny fluid level in the left maxillary sinus. Other:  The mastoid air cells are unopacified. CT CERVICAL SPINE FINDINGS Alignment: Straightening of the cervical spine. No facet subluxation. Dens is well positioned between the lateral masses of C1. Skull base and vertebrae: No acute fracture. No primary bone lesion or focal pathologic process. Soft tissues and spinal canal: No prevertebral edema. No visible canal hematoma. Disc levels: Mild multilevel degenerative disc disease, most prominent at C5-6. Mild bilateral facet arthropathy. No significant degenerative foraminal stenosis. Upper chest: No acute abnormality. Other: Visualized mastoid air  cells appear clear. Hypodense stable 3.1 cm right thyroid nodule. No pathologically enlarged cervical nodes. IMPRESSION: 1. No evidence of acute intracranial abnormality. No evidence of calvarial fracture. 2. Moderate chronic small vessel ischemic changes in the cerebral white matter. 3. No cervical spine fracture or subluxation. 4. Mild multilevel degenerative changes in the cervical spine as detailed. 5. Tiny fluid level in the left maxillary sinus, nonspecific, correlate for acute sinusitis. 6. Stable 3.1 cm right thyroid nodule. Recommend thyroid US if not previously performed (ref: J Am Coll Radiol. 2015 Feb;12(2): 143-50). Electronically Signed   By: Ilona Sorrel M.D.   On: 12/23/2019 14:42   CT Cervical Spine Wo Contrast  Result Date: 12/23/2019 CLINICAL DATA:  Multiple recent falls including today with left forehead injury. Posterior neck pain. EXAM: CT HEAD WITHOUT CONTRAST CT CERVICAL SPINE WITHOUT CONTRAST TECHNIQUE: Multidetector CT imaging of the head and cervical spine was performed following the standard protocol without intravenous contrast. Multiplanar CT image reconstructions of the cervical spine were also generated. COMPARISON:  05/18/2017 head and cervical spine CT. FINDINGS: CT HEAD FINDINGS Brain: No evidence of parenchymal hemorrhage or extra-axial fluid collection. No mass lesion, mass effect, or midline shift. No CT evidence of acute infarction. Nonspecific moderate subcortical and periventricular white matter hypodensity, most in keeping with chronic small vessel ischemic change. Cerebral volume is age appropriate. No ventriculomegaly. Vascular: No acute abnormality. Skull: No evidence of calvarial fracture. Sinuses/Orbits: Tiny fluid level in the left maxillary sinus. Other:  The mastoid air cells are unopacified. CT CERVICAL SPINE FINDINGS Alignment: Straightening of the cervical spine. No facet subluxation. Dens is well positioned between the lateral masses of C1. Skull base and  vertebrae: No acute fracture. No primary bone lesion or focal pathologic process. Soft tissues and spinal canal: No prevertebral edema. No visible canal hematoma. Disc levels: Mild multilevel degenerative disc disease, most prominent at C5-6. Mild bilateral facet arthropathy. No significant degenerative foraminal stenosis. Upper chest: No acute abnormality. Other: Visualized mastoid air cells appear clear. Hypodense stable 3.1 cm right thyroid nodule. No pathologically enlarged cervical nodes. IMPRESSION: 1. No evidence of acute intracranial abnormality. No evidence of calvarial fracture. 2. Moderate chronic small vessel ischemic changes in the cerebral white matter. 3. No cervical spine fracture or subluxation. 4. Mild multilevel degenerative changes in the cervical spine as detailed. 5. Tiny fluid level in the left maxillary sinus, nonspecific, correlate for acute sinusitis. 6. Stable 3.1 cm right thyroid nodule. Recommend thyroid US if not previously performed (ref: J Am Coll Radiol. 2015 Feb;12(2): 143-50). Electronically Signed   By: Ilona Sorrel M.D.   On: 12/23/2019 14:42        Scheduled Meds: . dextrose      . atorvastatin  20 mg Oral Daily  . cyclobenzaprine  10 mg Oral TID  . enoxaparin (LOVENOX) injection  30 mg Subcutaneous Q24H  . escitalopram  10 mg  Oral Daily  . ferrous sulfate  325 mg Oral BID WC  . folic acid  1 mg Oral Daily  . furosemide  40 mg Oral Daily  . gabapentin  400 mg Oral TID  . hydroxychloroquine  200 mg Oral Daily  . insulin aspart  0-20 Units Subcutaneous TID WC  . linagliptin  5 mg Oral Daily  . metoprolol tartrate  25 mg Oral BID  . pantoprazole  40 mg Oral BID  . potassium chloride  10 mEq Oral Daily  . ropinirole  5 mg Oral QHS  . senna-docusate  1 tablet Oral QHS   Continuous Infusions: . cefTRIAXone (ROCEPHIN)  IV    . metronidazole 500 mg (12/23/19 2357)     LOS: 1 day   Time spent= 35 mins    Ziair Penson Arsenio Loader, MD [facility]  If  7PM-7AM, please contact night-coverage  12/24/2019, 8:36 AM

## 2019-12-24 NOTE — Progress Notes (Signed)
[facility] night shift.  The staff reports of the patient has hypoglycemia 63 mg/dL.  Dextrose 25 g IVPB ordered.  She continues to be encephalopathic.  CBG monitoring every 6 hours has been ordered.  Tennis Must, MD

## 2019-12-24 NOTE — ED Notes (Signed)
Dr. Olevia Bowens made aware of the patients glucose level of 63.  No orders in the chart for hypoglycemia and the patient is unable to take PO meds due to AMS.

## 2019-12-24 NOTE — ED Notes (Signed)
Patient is resting comfortably. 

## 2019-12-25 DIAGNOSIS — K921 Melena: Secondary | ICD-10-CM

## 2019-12-25 LAB — GASTROINTESTINAL PANEL BY PCR, STOOL (REPLACES STOOL CULTURE)

## 2019-12-25 LAB — GLUCOSE, CAPILLARY
Glucose-Capillary: 108 mg/dL — ABNORMAL HIGH (ref 70–99)
Glucose-Capillary: 124 mg/dL — ABNORMAL HIGH (ref 70–99)
Glucose-Capillary: 124 mg/dL — ABNORMAL HIGH (ref 70–99)
Glucose-Capillary: 129 mg/dL — ABNORMAL HIGH (ref 70–99)

## 2019-12-25 LAB — COMPREHENSIVE METABOLIC PANEL
ALT: 8 U/L (ref 0–44)
AST: 13 U/L — ABNORMAL LOW (ref 15–41)
Albumin: 2.6 g/dL — ABNORMAL LOW (ref 3.5–5.0)
Alkaline Phosphatase: 68 U/L (ref 38–126)
Anion gap: 6 (ref 5–15)
BUN: 28 mg/dL — ABNORMAL HIGH (ref 8–23)
CO2: 22 mmol/L (ref 22–32)
Calcium: 8.2 mg/dL — ABNORMAL LOW (ref 8.9–10.3)
Chloride: 104 mmol/L (ref 98–111)
Creatinine, Ser: 1.14 mg/dL — ABNORMAL HIGH (ref 0.44–1.00)
GFR calc Af Amer: 56 mL/min — ABNORMAL LOW (ref >60)
GFR calc non Af Amer: 48 mL/min — ABNORMAL LOW (ref >60)
Glucose, Bld: 123 mg/dL — ABNORMAL HIGH (ref 70–99)
Potassium: 4.3 mmol/L (ref 3.5–5.1)
Sodium: 132 mmol/L — ABNORMAL LOW (ref 135–145)
Total Bilirubin: 0.5 mg/dL (ref 0.3–1.2)
Total Protein: 5.1 g/dL — ABNORMAL LOW (ref 6.5–8.1)

## 2019-12-25 LAB — CBC
HCT: 26.3 % — ABNORMAL LOW (ref 36.0–46.0)
Hemoglobin: 8.6 g/dL — ABNORMAL LOW (ref 12.0–15.0)
MCH: 27.9 pg (ref 26.0–34.0)
MCHC: 32.7 g/dL (ref 30.0–36.0)
MCV: 85.4 fL (ref 80.0–100.0)
Platelets: 175 10*3/uL (ref 150–400)
RBC: 3.08 MIL/uL — ABNORMAL LOW (ref 3.87–5.11)
RDW: 18.1 % — ABNORMAL HIGH (ref 11.5–15.5)
WBC: 5.7 10*3/uL (ref 4.0–10.5)
nRBC: 0 % (ref 0.0–0.2)

## 2019-12-25 LAB — URINE CULTURE: Culture: NO GROWTH

## 2019-12-25 LAB — C DIFFICILE QUICK SCREEN W PCR REFLEX
C Diff antigen: NEGATIVE
C Diff interpretation: NOT DETECTED
C Diff toxin: NEGATIVE

## 2019-12-25 LAB — MAGNESIUM: Magnesium: 1.5 mg/dL — ABNORMAL LOW (ref 1.7–2.4)

## 2019-12-25 MED ORDER — HYDRALAZINE HCL 25 MG PO TABS
25.0000 mg | ORAL_TABLET | Freq: Three times a day (TID) | ORAL | Status: DC
  Administered 2019-12-25 – 2019-12-27 (×6): 25 mg via ORAL
  Filled 2019-12-25 (×6): qty 1

## 2019-12-25 MED ORDER — ACETAMINOPHEN 325 MG PO TABS: 650.0000 mg | ORAL_TABLET | Freq: Four times a day (QID) | ORAL | Status: DC | PRN

## 2019-12-25 MED ORDER — MAGNESIUM SULFATE 4 GM/100ML IV SOLN
4.0000 g | Freq: Once | INTRAVENOUS | Status: AC
  Administered 2019-12-25: 4 g via INTRAVENOUS
  Filled 2019-12-25: qty 100

## 2019-12-25 MED ORDER — SUCRALFATE 1 GM/10ML PO SUSP
1.0000 g | Freq: Three times a day (TID) | ORAL | Status: DC
  Administered 2019-12-25 – 2019-12-27 (×8): 1 g via ORAL
  Filled 2019-12-25 (×8): qty 10

## 2019-12-25 MED ORDER — HYDROCODONE-ACETAMINOPHEN 5-325 MG PO TABS
1.0000 | ORAL_TABLET | Freq: Four times a day (QID) | ORAL | Status: DC | PRN
  Administered 2019-12-26: 1 via ORAL
  Filled 2019-12-25: qty 1

## 2019-12-25 MED ORDER — ENOXAPARIN SODIUM 40 MG/0.4ML ~~LOC~~ SOLN: 40.0000 mg | SUBCUTANEOUS | Status: DC

## 2019-12-25 MED ORDER — VITAMIN D 25 MCG (1000 UNIT) PO TABS
1000.0000 [IU] | ORAL_TABLET | Freq: Every day | ORAL | Status: DC
  Administered 2019-12-25 – 2019-12-27 (×3): 1000 [IU] via ORAL
  Filled 2019-12-25 (×3): qty 1

## 2019-12-25 MED ORDER — DEXTROSE-NACL 5-0.45 % IV SOLN: INTRAVENOUS | Status: AC

## 2019-12-25 MED ORDER — LISINOPRIL 10 MG PO TABS: 10.0000 mg | ORAL_TABLET | Freq: Every day | ORAL | Status: DC

## 2019-12-25 NOTE — Evaluation (Addendum)
Physical Therapy Evaluation Patient Details Name: Chloe Harding MRN: [id number] DOB: Jan 16, 1946 Today's Date: 12/25/2019   History of Present Illness  74 year old with history of DM2, HTN, HLD, CAD, CHF, CKD stage IIIa presents 12/23/19 with generalized weakness for 1 week and fall.  Recent admission for CHF exacerbation/pneumonia and heme positive stools. Intermittent behavioral disturbances at home.  Upon admission she was febrile, acute kidney injury, hyponatremia.  Clinical Impression  The patient is quite weak, 2 assist to  [facility] to  Recliner. Uncertain of caregiver availability, patient does not provide  Info, actually was confused and asked  If the lady was out of the laundry room. Pt admitted with above diagnosis.  Pt currently with functional limitations due to the deficits listed below (see PT Problem List). Pt will benefit from skilled PT to increase their independence and safety with mobility to allow discharge to the venue listed below.       Follow Up Recommendations Home health PT; vs SNF, depending on caregivers.    Equipment Recommendations  None recommended by PT    Recommendations for Other Services       Precautions / Restrictions Precautions Precautions: Fall Precaution Comments: enteric until cleared      Mobility  Bed Mobility Overal bed mobility: Needs Assistance Bed Mobility: Supine to Sit     Supine to sit: Min assist     General bed mobility comments: extra time to mobilize to sitting on bed edge  Transfers Overall transfer level: Needs assistance Equipment used: Rolling walker (2 wheeled) Transfers: Sit to/from [facility] Sit to Stand: Mod assist Stand pivot transfers: Mod assist       General transfer comment: multimodal cues  for safety , Cues for  safety. HR noted 115.  Spo2 > 90%  Ambulation/Gait                Stairs            Wheelchair Mobility    Modified Rankin (Stroke Patients Only)        Balance                                             Pertinent Vitals/Pain Pain Assessment: Faces Faces Pain Scale: Hurts little more Pain Location: rectum from wiping a lot Pain Descriptors / Indicators: Burning;Discomfort Pain Intervention(s): Limited activity within patient's tolerance;Monitored during session    [place] expects to be discharged to:: Private residence Living Arrangements: Spouse/significant other Available Help at Discharge: Available PRN/intermittently Type of Home: House Home Access: Stairs to enter Entrance Stairs-Rails: Right Entrance Stairs-Number of Steps: 10 in front, ramp in the back Home Layout: One level Home Equipment: Walker - 2 wheels;Cane - single point;Toilet riser      Prior Function           Comments: unsure how much assistance spouse has been providing     Hand Dominance   Dominant Hand: Right    Extremity/Trunk Assessment   Upper Extremity Assessment Upper Extremity Assessment: Generalized weakness    Lower Extremity Assessment Lower Extremity Assessment: Generalized weakness    Cervical / Trunk Assessment Cervical / Trunk Assessment: Kyphotic  Communication   Communication: No difficulties  Cognition Arousal/Alertness: Awake/alert Behavior During Therapy: Flat affect Overall Cognitive Status: No family/caregiver present to determine baseline cognitive functioning Area of Impairment: Orientation;Following commands  Following Commands: Follows one step commands consistently       General Comments: slow to respond, Asking about the lady  getting laundry from washer?      General Comments      Exercises     Assessment/Plan    PT Assessment Patient needs continued PT services  PT Problem List Decreased strength;Decreased mobility;Decreased activity tolerance;Decreased balance;Decreased knowledge of use of DME;Decreased safety  awareness;Decreased knowledge of precautions       PT Treatment Interventions DME instruction;Therapeutic activities;Gait training;Therapeutic exercise;Patient/family education;Functional mobility training    PT Goals (Current goals can be found in the Care Plan section)  Acute Rehab PT Goals Patient Stated Goal: to go home PT Goal Formulation: With patient Time For Goal Achievement: 01/08/20 Potential to Achieve Goals: Fair    Frequency Min 3X/week   Barriers to discharge        Co-evaluation               AM-PAC PT "6 Clicks" Mobility  Outcome Measure Help needed turning from your back to your side while in a flat bed without using bedrails?: A Lot Help needed moving from lying on your back to sitting on the side of a flat bed without using bedrails?: A Lot Help needed moving to and from a bed to a chair (including a wheelchair)?: A Lot Help needed standing up from a chair using your arms (e.g., wheelchair or bedside chair)?: A Lot Help needed to walk in hospital room?: A Lot Help needed climbing 3-5 steps with a railing? : Total 6 Click Score: 11    End of Session   Activity Tolerance: Patient limited by fatigue Patient left: in chair;with call bell/phone within reach;with family/visitor present Nurse Communication: Mobility status PT Visit Diagnosis: Unsteadiness on feet (R26.81);Difficulty in walking, not elsewhere classified (R26.2)    Time: 1044-1100 PT Time Calculation (min) (ACUTE ONLY): 16 min   Charges:   PT Evaluation $PT Eval Low Complexity: [facility] Acute Rehabilitation Services Pager [phone] Office [phone]   Claretha Cooper 12/25/2019, 1:18 PM

## 2019-12-25 NOTE — Progress Notes (Addendum)
PROGRESS NOTE    Chloe Harding  YSA:[id number] DOB: 1945-11-07 DOA: 12/23/2019 PCP: [name], NP   Brief Narrative:  74 year old with history of DM2, HTN, HLD, CAD, CHF, CKD stage IIIa presents with generalized weakness for 1 week and fall.  Admitted 2 months ago for CHF exacerbation/pneumonia and heme positive stools.  GI work-up negative.  Intermittent behavioral disturbances at home.  Upon admission she was febrile, acute kidney injury.  Started on empiric Rocephin and Flagyl.  Acute kidney injury, hyponatremia hydration status is improving slowly.   Assessment & Plan:   Active Problems:   Type 2 diabetes mellitus (HCC)   ANEMIA   HYPERTENSION, BENIGN ESSENTIAL   CHRONIC KIDNEY DISEASE STAGE I   Sepsis ([place])   Diarrhea  SIRS -Unclear etiology, cultures remain negative -Empiric IV Rocephin and Flagyl -UA-negative, chest x-ray-negative  Addendum= stool studies neg. SIRS improved. Will stop Abx this afternoon and monitor  Acute kidney injury, improving -Baseline creatinine 0.6.  Admission creatinine 2.16, improving with hydration.  Creatinine 1.14 -IV fluids, monitor urine output.  Bladder scans if necessary -Hold Lasix  Mild to moderate dehydration Watery diarrhea -C. difficile-pending -GI panel-pending -Supportive care, IV fluids. -CT abdomen pelvis-negative for acute pathology  Altered mental status/encephalopathy Falls at home, unsteady gait -CT head/C-spine-moderate chronic small vessel changes, no acute pathology, -TSH and ammonia levels-within normal limits -Vitamin D level slightly low -PT/OT -Possible metabolite of Norco/Flexeril and gabapentin buildup in the setting of AKI.  Hopefully steadiness and mentation will improve as renal function improves.  Positive Hemoccult/melena -Endoscopy 01/2019-benign esophageal stenosis, hiatal hernia. She is on Iron tab, & plaquenil.  -Colonoscopy 05/2017-3 mm rectosigmoid polyp status post resection,  diverticulosis. -I spoke with Dr. Benson Norway this morning.  Patient does have significant history of severe esophagitis.  Because she is hemodynamically stable, recommending PPI for now, clinically monitor.  If necessary he will be available for consultation.  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  Essential hypertension, uncontrolled -Metoprolol 25 mg twice daily.  Add hydralazine 25 mg 3 times daily -IV hydralazine as needed  Chronic congestive heart failure with reduced ejection fraction, 45% EF, grade 1 diastolic dysfunction -Gentle hydration.  Continue metoprolol.  Supportive care.  Anemia of chronic disease and iron deficiency -Iron supplements  Restless leg syndrome -Ropinirole  Hyperlipidemia -Daily Lipitor  History of depression -Daily Lexapro   DVT prophylaxis: SCDs Code Status: Full code Family Communication:  [name] updated.   Status is: Inpatient  Remains inpatient appropriate because:Inpatient level of care appropriate due to severity of illness   Dispo: The patient is from: Home              Anticipated d/c is to: Home              Anticipated d/c date is: 3 days              Patient currently is not medically stable to d/c.  Patient is quiet drowsy with recurrent hypoglycemia.  Maintain hospital stay.  Unsafe for discharge       Body mass index is 33.07 kg/m.     Subjective: Looks better to me today, still having dark stools.  Tolerating orals for now. As always seeing her she started having lots of heartburn type of discomfort which she tells me is typical for her.  She has not  been taking her antiacid medicines at home correctly.  Review of Systems Otherwise negative except as per HPI, including: General = no fevers, chills, dizziness,  fatigue HEENT/EYES = negative for loss of vision, double vision, blurred vision,  sore throa Cardiovascular= negative for chest pain, palpitation Respiratory/lungs= negative for shortness of breath, cough, wheezing; hemoptysis,  Gastrointestinal= negative for nausea, vomiting Genitourinary= negative for Dysuria MSK = Negative for arthralgia, myalgias Neurology= Negative for headache, numbness, tingling  Psychiatry= Negative for suicidal and homocidal ideation Skin= Negative for Rash  Examination: Constitutional: Not in acute distress; dry mouth. Chronically Ill appearing.  Respiratory: Clear to auscultation bilaterally Cardiovascular: Normal sinus rhythm, no rubs Abdomen: Nontender nondistended good bowel sounds Musculoskeletal: No edema noted Skin: No rashes seen Neurologic: CN 2-12 grossly intact.  And nonfocal Psychiatric: Normal judgment and insight. Alert and oriented x 3. Normal mood.    Objective: Vitals:   12/24/19 2300 12/25/19 0000 12/25/19 0337 12/25/19 0400  BP: (!) 159/79 (!) 149/78  (!) 170/76  Pulse: 90 88  89  Resp: 17 16  20   Temp:  97.8 F (36.6 C) 98.9 F (37.2 C)   TempSrc:  Oral Oral   SpO2: 97% 99%  99%  Weight:      Height:        Intake/Output Summary (Last 24 hours) at 12/25/2019 0732 Last data filed at 12/25/2019 0400 Gross per 24 hour  Intake 1520.22 ml  Output 2000 ml  Net -479.78 ml   Filed Weights   12/23/19 1358  Weight: 79.4 kg     Data Reviewed:   CBC: Recent Labs  Lab 12/23/19 1325 12/24/19 0500 12/25/19 0237  WBC 13.2* 6.5 5.7  NEUTROABS 9.1*  --   --   HGB 11.5* 8.9* 8.6*  HCT 35.9* 27.8* 26.3*  MCV 84.1 85.3 85.4  PLT 239 164 627   Basic Metabolic Panel: Recent Labs  Lab 12/23/19 1325 12/24/19 0500 12/25/19 0237  NA 134* 136 132*  K 4.7 3.8 4.3  CL  98 107 104  CO2 26 21* 22  GLUCOSE 105* 63* 123*  BUN 41* 39* 28*  CREATININE 2.16* 1.55* 1.14*  CALCIUM 9.5 8.2* 8.2*  MG  --   --  1.5*   GFR: Estimated Creatinine Clearance: 42.5 mL/min (A) (by C-G formula based on SCr of 1.14 mg/dL (H)). Liver Function Tests: Recent Labs  Lab 12/23/19 1325 12/25/19 0237  AST 18 13*  ALT 10 8  ALKPHOS 121 68  BILITOT 0.9 0.5  PROT 6.9 5.1*  ALBUMIN 3.5 2.6*   Recent Labs  Lab 12/23/19 1420  LIPASE 22   Recent Labs  Lab 12/24/19 1210  AMMONIA 17   Coagulation Profile: Recent Labs  Lab 12/23/19 1420  INR 1.3*   Cardiac Enzymes: No results for input(s): CKTOTAL, CKMB, CKMBINDEX, TROPONINI in the last 168 hours. BNP (last 3 results) No results for input(s): PROBNP in the last 8760 hours. HbA1C: No results for input(s): HGBA1C in the last 72 hours. CBG: Recent Labs  Lab 12/24/19 1037 12/24/19 1126 12/24/19 1254 12/24/19 1643 12/24/19 2141  GLUCAP 109* 103* 126* 185* 133*   Lipid Profile: No results for input(s): CHOL, HDL, LDLCALC, TRIG, CHOLHDL, LDLDIRECT in the last 72 hours. Thyroid Function Tests: Recent Labs    12/24/19 1210  TSH 0.902  Anemia Panel: Recent Labs    12/24/19 1639  VITAMINB12 404  FERRITIN 84  TIBC 175*  IRON 23*   Sepsis Labs: Recent Labs  Lab 12/23/19 1408 12/23/19 1608  LATICACIDVEN 1.1 1.1    Recent Results (from the past 240 hour(s))  SARS Coronavirus 2 by RT PCR (hospital order, performed in [facility] hospital lab) Nasopharyngeal Nasopharyngeal Swab     Status: None   Collection Time: 12/23/19  2:08 PM   Specimen: Nasopharyngeal Swab  Result Value Ref Range Status   SARS Coronavirus 2 NEGATIVE NEGATIVE Final    Comment: (NOTE) SARS-CoV-2 target nucleic acids are NOT DETECTED.  The SARS-CoV-2 RNA is generally detectable in upper and lower respiratory specimens during the acute phase of infection. The lowest concentration of SARS-CoV-2 viral copies this assay can detect  is 250 copies / mL. A negative result does not preclude SARS-CoV-2 infection and should not be used as the sole basis for treatment or other patient management decisions.  A negative result may occur with improper specimen collection / handling, submission of specimen other than nasopharyngeal swab, presence of viral mutation(s) within the areas targeted by this assay, and inadequate number of viral copies (<250 copies / mL). A negative result must be combined with clinical observations, patient history, and epidemiological information.  Fact Sheet for Patients:   StrictlyIdeas.no  Fact Sheet for Healthcare Providers: BankingDealers.co.za  This test is not yet approved or  cleared by the [place] FDA and has been authorized for detection and/or diagnosis of SARS-CoV-2 by FDA under an Emergency Use Authorization (EUA).  This EUA will remain in effect (meaning this test can be used) for the duration of the COVID-19 declaration under Section 564(b)(1) of the Act, 21 U.S.C. section 360bbb-3(b)(1), unless the authorization is terminated or revoked sooner.  Performed at [facility], [place] [address] [place], [place] [address]   Blood culture (routine x 2)     Status: None (Preliminary result)   Collection Time: 12/23/19  2:21 PM   Specimen: BLOOD  Result Value Ref Range Status   Specimen Description   Final    BLOOD RIGHT ANTECUBITAL Performed at [facility], [place] [address], [place], [place] [address]    Special Requests   Final    BOTTLES DRAWN AEROBIC AND ANAEROBIC Blood Culture results may not be optimal due to an inadequate volume of blood received in culture bottles Performed at [place] [address] [place], [place] [address]    Culture   Final    NO GROWTH < 24 HOURS Performed at [place] [address] [place], [place] [address]    Report Status PENDING   Incomplete  Blood culture (routine x 2)     Status: None (Preliminary result)   Collection Time: 12/23/19  2:21 PM   Specimen: BLOOD  Result Value Ref Range Status   Specimen Description   Final    BLOOD LEFT ANTECUBITAL Performed at [facility], [place] [address], [place], [place] [address]    Special Requests   Final    BOTTLES DRAWN AEROBIC AND ANAEROBIC Blood Culture results may not be optimal due to an inadequate volume of blood received in culture bottles Performed at [place] [address] [place], [place] [address]    Culture   Final    NO GROWTH < 24 HOURS Performed at [place] [address], [place], [place] [address]  Report Status PENDING  Incomplete  MRSA PCR Screening     Status: None   Collection Time: 12/24/19  4:53 PM   Specimen: Nasal Mucosa; Nasopharyngeal  Result Value Ref Range Status   MRSA by PCR NEGATIVE NEGATIVE Final    Comment:        The GeneXpert MRSA Assay (FDA approved for NASAL specimens only), is one component of a comprehensive MRSA colonization surveillance program. It is not intended to diagnose MRSA infection nor to guide or monitor treatment for MRSA infections. Performed at [facility], [place] [address] [place], [place] [address]          Radiology Studies: CT Abdomen Pelvis Wo Contrast  Result Date: 12/23/2019 CLINICAL DATA:  Confused and  unstable EXAM: CT ABDOMEN AND PELVIS WITHOUT CONTRAST TECHNIQUE: Multidetector CT imaging of the abdomen and pelvis was performed following the standard protocol without IV contrast. COMPARISON:  None. FINDINGS: Lower chest: Lung bases are clear. Hepatobiliary: No focal hepatic lesion. Postcholecystectomy. No biliary dilatation. Pancreas: Pancreas is normal. No ductal dilatation. No pancreatic inflammation. Spleen: Normal spleen Adrenals/urinary tract: Adrenal glands and kidneys are normal. The ureters and bladder normal. Stomach/Bowel:  Small hiatal hernia. Stomach, duodenum and small-bowel normal. Ascending, transverse and descending colon normal. Vascular/Lymphatic: Abdominal aorta is normal caliber with atherosclerotic calcification. There is no retroperitoneal or periportal lymphadenopathy. No pelvic lymphadenopathy. Reproductive: Post hysterectomy.  Adnexa unremarkable Other: No free fluid. Musculoskeletal: Degenerative changes of the spine. No acute findings. IMPRESSION: 1. No acute abdominopelvic findings on noncontrast exam. 2. No bowel obstruction or inflammation identified. 3.  Aortic Atherosclerosis (ICD10-I70.0). Electronically Signed   By: Suzy Bouchard M.D.   On: 12/23/2019 16:44   DG Chest 2 View  Result Date: 12/23/2019 CLINICAL DATA:  Multiple falls EXAM: CHEST - 2 VIEW COMPARISON:  Radiograph 11/07/2019 FINDINGS: Normal cardiac silhouette. Low lung volumes. Chronic elevation RIGHT hemidiaphragm. No effusion, infiltrate, or pneumothorax. No acute osseous abnormality. IMPRESSION: 1. No acute cardiopulmonary findings.  No evidence trauma. 2. Low lung volumes similar to prior. Electronically Signed   By: Suzy Bouchard M.D.   On: 12/23/2019 14:47   CT Head Wo Contrast  Result Date: 12/23/2019 CLINICAL DATA:  Multiple recent falls including today with left forehead injury. Posterior neck pain. EXAM: CT HEAD WITHOUT CONTRAST CT CERVICAL SPINE WITHOUT CONTRAST TECHNIQUE: Multidetector CT imaging of the head and cervical spine was performed following the standard protocol without intravenous contrast. Multiplanar CT image reconstructions of the cervical spine were also generated. COMPARISON:  05/18/2017 head and cervical spine CT. FINDINGS: CT HEAD FINDINGS Brain: No evidence of parenchymal hemorrhage or extra-axial fluid collection. No mass lesion, mass effect, or midline shift. No CT evidence of acute infarction. Nonspecific moderate subcortical and periventricular white matter hypodensity, most in keeping with chronic small  vessel ischemic change. Cerebral volume is age appropriate. No ventriculomegaly. Vascular: No acute abnormality. Skull: No evidence of calvarial fracture. Sinuses/Orbits: Tiny fluid level in the left maxillary sinus. Other:  The mastoid air cells are unopacified. CT CERVICAL SPINE FINDINGS Alignment: Straightening of the cervical spine. No facet subluxation. Dens is well positioned between the lateral masses of C1. Skull base and vertebrae: No acute fracture. No primary bone lesion or focal pathologic process. Soft tissues and spinal canal: No prevertebral edema. No visible canal hematoma. Disc levels: Mild multilevel degenerative disc disease, most prominent at C5-6. Mild bilateral facet arthropathy. No significant degenerative foraminal stenosis. Upper chest: No acute abnormality. Other: Visualized mastoid air cells appear clear.  Hypodense stable 3.1 cm right thyroid nodule. No pathologically enlarged cervical nodes. IMPRESSION: 1. No evidence of acute intracranial abnormality. No evidence of calvarial fracture. 2. Moderate chronic small vessel ischemic changes in the cerebral white matter. 3. No cervical spine fracture or subluxation. 4. Mild multilevel degenerative changes in the cervical spine as detailed. 5. Tiny fluid level in the left maxillary sinus, nonspecific, correlate for acute sinusitis. 6. Stable 3.1 cm right thyroid nodule. Recommend thyroid US if not previously performed (ref: J Am Coll Radiol. 2015 Feb;12(2): 143-50). Electronically Signed   By: Ilona Sorrel M.D.   On: 12/23/2019 14:42   CT Cervical Spine Wo Contrast  Result Date: 12/23/2019 CLINICAL DATA:  Multiple recent falls including today with left forehead injury. Posterior neck pain. EXAM: CT HEAD WITHOUT CONTRAST CT CERVICAL SPINE WITHOUT CONTRAST TECHNIQUE: Multidetector CT imaging of the head and cervical spine was performed following the standard protocol without intravenous contrast. Multiplanar CT image reconstructions of the  cervical spine were also generated. COMPARISON:  05/18/2017 head and cervical spine CT. FINDINGS: CT HEAD FINDINGS Brain: No evidence of parenchymal hemorrhage or extra-axial fluid collection. No mass lesion, mass effect, or midline shift. No CT evidence of acute infarction. Nonspecific moderate subcortical and periventricular white matter hypodensity, most in keeping with chronic small vessel ischemic change. Cerebral volume is age appropriate. No ventriculomegaly. Vascular: No acute abnormality. Skull: No evidence of calvarial fracture. Sinuses/Orbits: Tiny fluid level in the left maxillary sinus. Other:  The mastoid air cells are unopacified. CT CERVICAL SPINE FINDINGS Alignment: Straightening of the cervical spine. No facet subluxation. Dens is well positioned between the lateral masses of C1. Skull base and vertebrae: No acute fracture. No primary bone lesion or focal pathologic process. Soft tissues and spinal canal: No prevertebral edema. No visible canal hematoma. Disc levels: Mild multilevel degenerative disc disease, most prominent at C5-6. Mild bilateral facet arthropathy. No significant degenerative foraminal stenosis. Upper chest: No acute abnormality. Other: Visualized mastoid air cells appear clear. Hypodense stable 3.1 cm right thyroid nodule. No pathologically enlarged cervical nodes. IMPRESSION: 1. No evidence of acute intracranial abnormality. No evidence of calvarial fracture. 2. Moderate chronic small vessel ischemic changes in the cerebral white matter. 3. No cervical spine fracture or subluxation. 4. Mild multilevel degenerative changes in the cervical spine as detailed. 5. Tiny fluid level in the left maxillary sinus, nonspecific, correlate for acute sinusitis. 6. Stable 3.1 cm right thyroid nodule. Recommend thyroid US if not previously performed (ref: J Am Coll Radiol. 2015 Feb;12(2): 143-50). Electronically Signed   By: Ilona Sorrel M.D.   On: 12/23/2019 14:42        Scheduled  Meds: . atorvastatin  20 mg Oral Daily  . Chlorhexidine Gluconate Cloth  6 each Topical Daily  . cyclobenzaprine  10 mg Oral TID  . enoxaparin (LOVENOX) injection  40 mg Subcutaneous Q24H  . escitalopram  10 mg Oral Daily  . famotidine  20 mg Oral BID  . ferrous sulfate  325 mg Oral BID WC  . folic acid  1 mg Oral Daily  . gabapentin  400 mg Oral TID  . hydroxychloroquine  200 mg Oral Daily  . insulin aspart  0-20 Units Subcutaneous TID WC  . metoprolol tartrate  25 mg Oral BID  . pantoprazole  40 mg Oral BID AC  . potassium chloride  10 mEq Oral Daily  . ropinirole  5 mg Oral QHS  . senna-docusate  1 tablet Oral QHS   Continuous Infusions: .  cefTRIAXone (ROCEPHIN)  IV    . dextrose 5 % and 0.45% NaCl 75 mL/hr at 12/25/19 0400  . metronidazole Stopped (12/25/19 0028)     LOS: 2 days   Time spent= 35 mins    Tarin Navarez Arsenio Loader, MD [facility]  If 7PM-7AM, please contact night-coverage  12/25/2019, 7:32 AM

## 2019-12-26 LAB — CBC
HCT: 26.1 % — ABNORMAL LOW (ref 36.0–46.0)
Hemoglobin: 8.2 g/dL — ABNORMAL LOW (ref 12.0–15.0)
MCH: 27.4 pg (ref 26.0–34.0)
MCHC: 31.4 g/dL (ref 30.0–36.0)
MCV: 87.3 fL (ref 80.0–100.0)
Platelets: 183 10*3/uL (ref 150–400)
RBC: 2.99 MIL/uL — ABNORMAL LOW (ref 3.87–5.11)
RDW: 17.8 % — ABNORMAL HIGH (ref 11.5–15.5)
WBC: 4.6 10*3/uL (ref 4.0–10.5)
nRBC: 0 % (ref 0.0–0.2)

## 2019-12-26 LAB — COMPREHENSIVE METABOLIC PANEL
ALT: 7 U/L (ref 0–44)
AST: 14 U/L — ABNORMAL LOW (ref 15–41)
Albumin: 2.8 g/dL — ABNORMAL LOW (ref 3.5–5.0)
Alkaline Phosphatase: 63 U/L (ref 38–126)
Anion gap: 5 (ref 5–15)
BUN: 15 mg/dL (ref 8–23)
CO2: 21 mmol/L — ABNORMAL LOW (ref 22–32)
Calcium: 8.4 mg/dL — ABNORMAL LOW (ref 8.9–10.3)
Chloride: 107 mmol/L (ref 98–111)
Creatinine, Ser: 0.82 mg/dL (ref 0.44–1.00)
GFR calc Af Amer: >60 mL/min (ref >60)
GFR calc non Af Amer: >60 mL/min (ref >60)
Glucose, Bld: 125 mg/dL — ABNORMAL HIGH (ref 70–99)
Potassium: 3.8 mmol/L (ref 3.5–5.1)
Sodium: 133 mmol/L — ABNORMAL LOW (ref 135–145)
Total Bilirubin: 0.5 mg/dL (ref 0.3–1.2)
Total Protein: 5.4 g/dL — ABNORMAL LOW (ref 6.5–8.1)

## 2019-12-26 LAB — FOLATE RBC
Folate, Hemolysate: >620 ng/mL
Folate, RBC: >2153 ng/mL (ref >498)
Hematocrit: 28.8 % — ABNORMAL LOW (ref 34.0–46.6)

## 2019-12-26 LAB — GLUCOSE, CAPILLARY
Glucose-Capillary: 123 mg/dL — ABNORMAL HIGH (ref 70–99)
Glucose-Capillary: 126 mg/dL — ABNORMAL HIGH (ref 70–99)
Glucose-Capillary: 141 mg/dL — ABNORMAL HIGH (ref 70–99)
Glucose-Capillary: 84 mg/dL (ref 70–99)

## 2019-12-26 LAB — MAGNESIUM: Magnesium: 2.1 mg/dL (ref 1.7–2.4)

## 2019-12-26 NOTE — TOC Initial Note (Signed)
Transition of Care [facility]) - Initial/Assessment Note    Patient Details  Name: Chloe Harding MRN: [id number] Date of Birth: [birth year]  Transition of [facility]) CM/SW Contact:    Trish Mage, LCSW Phone Number: 12/26/2019, 3:22 PM  Clinical Narrative:   Spoke with husband who states the plan is to have patient come home.  Although all family members, including him, work during the day, they are putting together a schedule so someone is always in the home when able during afternoon, evening hours.  He asked me to call daughter about the rest of the plan.  Spoke to [place], who initially stated she wanted her insurance to pay for in-home care during the day.  I explained insurance only pays for [facility] services, not care, and that would be out of pocket expense.  Clarified that MD is planning on d/cing tomorrow. She verbalized understanding and stated she would start making calls. TOC will continue to follow during the course of hospitalization.                Expected Discharge Plan: [place] Barriers to Discharge: Family Issues   Patient Goals and CMS Choice        Expected Discharge Plan and Services Expected Discharge Plan: [place]   Discharge Planning Services: CM Consult   Living arrangements for the past 2 months: Single Family Home                                      Prior Living Arrangements/Services Living arrangements for the past 2 months: Single Family Home Lives with:: Spouse Patient language and need for interpreter reviewed:: Yes        Need for Family Participation in Patient Care: Yes (Comment) Care giver support system in place?: Yes (comment)   Criminal Activity/Legal Involvement Pertinent to Current Situation/Hospitalization: No - Comment as needed  Activities of Daily Living      Permission Sought/Granted Permission sought to share information with : Family Supports Permission granted to share  information with : Yes, Verbal Permission Granted  Share Information with NAME: Husband, daughter           Emotional Assessment Appearance:: Appears stated age Attitude/Demeanor/Rapport: Engaged Affect (typically observed): Unable to Assess Orientation: : Oriented to Self Alcohol / Substance Use: Not Applicable Psych Involvement: No (comment)  Admission diagnosis:  Weakness [R53.1] SIRS (systemic inflammatory response syndrome) ([place]) [R65.10] Fall, initial encounter [W19.XXXA] Sepsis [facility]) [A41.9] Patient Active Problem List   Diagnosis Date Noted  . Diarrhea 12/23/2019  . SOB (shortness of breath)   . Demand ischemia ([place])   . CAD in native artery   . Pneumonia 11/05/2019  . Hyponatremia 11/05/2019  . Acute CHF (congestive heart failure) ([place]) 11/05/2019  . Esophageal stricture   . Dysphagia 01/15/2019  . Symptomatic anemia 05/18/2017  . Acute-on-chronic kidney injury ([place]) 05/18/2017  . Hypertension 05/18/2017  . Hiatal hernia 05/18/2017  . History of esophageal stricture 05/18/2017  . Pure hypercholesterolemia 05/18/2017  . Diabetes mellitus 05/18/2017  . Solitary thyroid nodule 05/18/2017  . Hypotension 03/30/2015  . Sepsis ([place]) 03/30/2015  . CAP (community acquired pneumonia) 03/30/2015  . Hyperkalemia 03/30/2015  . Mild renal insufficiency 03/30/2015  . Nausea with vomiting 03/30/2015  . Diabetic neuropathy ([place]) 03/30/2015  . OBESITY 03/11/2010  . Type 2 diabetes mellitus ([place]) 12/26/2009  . Dyslipidemia 12/26/2009  .  ANEMIA 12/26/2009  . DEPRESSION 12/26/2009  . HYPERTENSION, BENIGN ESSENTIAL 12/26/2009  . GERD 12/26/2009  . HIATAL HERNIA 12/26/2009  . CHRONIC KIDNEY DISEASE STAGE I 12/26/2009  . UTI 12/26/2009  . ARTHRITIS 12/26/2009  . ARTHROSCOPY, RIGHT KNEE, HX OF 12/26/2009   PCP:  [name], NP Pharmacy:   [facility] [address]), [place] [address] [place] [address] Phone:  [phone] Fax: [phone]  [facility] [place], [place] [address] Phone: [phone] Fax: [phone]     Social Determinants of Health (SDOH) Interventions    Readmission Risk Interventions No flowsheet data found.

## 2019-12-26 NOTE — Progress Notes (Signed)
Pt's husband and Pt stated that when Mrs. Alfred is ready to be d/c'ed, for the nurse and/or nursing secretary to call pt's daughter.  Husband unclear in whether this is for transport or an update on the care of the pt.  Daughter is Charna Archer, reachable at [phone].

## 2019-12-26 NOTE — Care Management Important Message (Signed)
Important Message  Patient Details IM Letter given to the Patient Name: Chloe Harding MRN: [id number] Date of Birth: 12-06-1945   Medicare Important Message Given:  Yes     Kerin Salen 12/26/2019, 10:21 AM

## 2019-12-26 NOTE — Progress Notes (Signed)
Occupational Therapy Evaluation  Patient with functional deficits listed below impacting safety and independence with self care. Unsure of patient baseline, however with limited short term memory, safety awareness, problem solving. Patient initially stating she lives alone then later when phone rings after patient hangs up OT asked who it was pt states "my husband." When prompted "oh he doesn't live with you then?" Patient does not recall stating she lives alone earlier in session. Patient require multiple attempts and mod A to stand from EOB, min hand held assist and mod cues to safely navigate with IV pole from edge of bed to recliner ~58ft. Patient reports feeling fatigued after this. Currently patient will require 24/7 supervision/assistance for safety during self care and functional transfers, unsure of level of supervision at home with recommendation to D/C venue listed below.    12/26/19 1100  OT Visit Information  Last OT Received On 12/26/19  Assistance Needed +1  History of Present Illness 74 year old with history of DM2, HTN, HLD, CAD, CHF, CKD stage IIIa presents with generalized weakness for 1 week and fall.  Admitted 2 months ago for CHF exacerbation/pneumonia and heme positive stools. Intermittent behavioral disturbances at home.  Upon admission she was febrile, acute kidney injury, hyponatremia.  Precautions  Precautions Fall  Restrictions  Weight Bearing Restrictions No  Home Living  Family/patient expects to be discharged to: Private residence  Living Arrangements Spouse/significant other  Available Help at Discharge Available PRN/intermittently  Type of [place] to enter  Entrance Stairs-Number of Steps 10 in front, ramp in the back  [place] One level  Bathroom Shower/Tub Walk-in shower  Bathroom Toilet Handicapped height  Bathroom Accessibility Yes  How Accessible Accessible via [place] - 2  wheels;Cane - single point;Toilet riser  Additional Comments Information obtained from previous eval, patient unreliable narrator   Prior Function  Comments unsure how much assistance spouse has been providing  Communication  Communication Expressive difficulties (mumbled speech at times with difficulty word finding)  Pain Assessment  Pain Assessment Faces  Faces Pain Scale 0  Cognition  Arousal/Alertness Awake/alert  Behavior During Therapy Flat affect  Overall Cognitive Status No family/caregiver present to determine baseline cognitive functioning  Area of Impairment Orientation;Memory;Following commands;Safety/judgement;Problem solving  Orientation Level Disoriented to;Place;Situation  Memory Decreased short-term memory  Following Commands Follows one step commands with increased time  Safety/Judgement Decreased awareness of safety;Decreased awareness of deficits  Problem Solving Requires verbal cues;Requires tactile cues  General Comments patient first state she lives alone when asked who gets her groceries she says "you do," spouse called her at end of session when asked who it was on the phone she says "my husband" when asked if he lives with her patient states yes, cannot recall stating she lives alone earlier. patient knows shes not at home but cannot state where she is or why, does know the month/year   Upper Extremity Assessment  Upper Extremity Assessment Generalized weakness  Lower Extremity Assessment  Lower Extremity Assessment Defer to PT evaluation  ADL  Overall ADL's  Needs assistance/impaired  Grooming Wash/dry face;Set up;Sitting  Upper Body Bathing Set up;Sitting  Lower Body Bathing Minimal assistance;Sit to/from stand  Upper Body Dressing  Set up;Sitting  Lower Body Dressing Minimal assistance;Sit to/from stand;Sitting/lateral leans  Lower Body Dressing Details (indicate cue type and reason) seated patient able to doff/don sock, min A for standing balance  Toilet  Transfer Minimal assistance;Cueing for safety;Cueing for sequencing;Ambulation;BSC;Moderate  assistance  Toilet Transfer Details (indicate cue type and reason) hand held assist, poor dynamic balance, cues for navigating around bed to recliner, poor safety awareness. mod A sit to stand, min A for ambulation  Toileting- Clothing Manipulation and Hygiene Minimal assistance;Sit to/from stand;Sitting/lateral lean  Functional mobility during ADLs Minimal assistance;Cueing for safety;Cueing for sequencing  General ADL Comments patient requiring increased assistance with self care due to decreased activity tolerance, safety awareness, balance, strength  Bed Mobility  General bed mobility comments seated EOB upon arrival   Transfers  Overall transfer level Needs assistance  Equipment used 1 person hand held assist  Transfers Sit to/from Stand  Sit to Stand Mod assist  General transfer comment requires multiple attempts to stand from edge of bed with cues for hand placement, mod A to power up to standing. min A to ambulate ~5 ft to recliner patient reports feeling fatigued with this.   Balance  Overall balance assessment Needs assistance  Sitting-balance support No upper extremity supported;Feet supported  Sitting balance-Leahy Scale Fair  Standing balance support Single extremity supported;During functional activity  Standing balance-Leahy Scale Poor  Standing balance comment reliant on external support  OT - End of Session  Activity Tolerance Patient tolerated treatment well  Patient left in chair;with call bell/phone within reach;with chair alarm set  Nurse Communication Mobility status  OT Assessment  OT Recommendation/Assessment Patient needs continued OT Services  OT Visit Diagnosis Other abnormalities of gait and mobility (R26.89);Muscle weakness (generalized) (M62.81);Other symptoms and signs involving cognitive function  OT Problem List Decreased strength;Decreased activity  tolerance;Impaired balance (sitting and/or standing);Decreased safety awareness;Decreased cognition;Pain  OT Plan  OT Frequency (ACUTE ONLY) Min 2X/week  OT Treatment/Interventions (ACUTE ONLY) Self-care/ADL training;Therapeutic exercise;DME and/or AE instruction;Therapeutic activities;Cognitive remediation/compensation;Patient/family education;Balance training  AM-PAC OT "6 Clicks" Daily Activity Outcome Measure (Version 2)  Help from another person eating meals? 3  Help from another person taking care of personal grooming? 3  Help from another person toileting, which includes using toliet, bedpan, or urinal? 2  Help from another person bathing (including washing, rinsing, drying)? 3  Help from another person to put on and taking off regular upper body clothing? 3  Help from another person to put on and taking off regular lower body clothing? 3  6 Click Score 17  OT Recommendation  Follow Up Recommendations Supervision/Assistance - 24 hour;SNF;Other (comment) (pt will need 24/7 supervision, unsure of assist level at hom)  OT Equipment Other (comment) (TBD)  Individuals Consulted  Consulted and Agree with Results and Recommendations Patient  Acute Rehab OT Goals  Patient Stated Goal "i want to go home"  OT Goal Formulation With patient  Time For Goal Achievement 01/09/20  Potential to [place]  OT Time Calculation  OT Start Time (ACUTE ONLY) 0755  OT Stop Time (ACUTE ONLY) 9292  OT Time Calculation (min) 27 min  OT General Charges  $OT Visit 1 Visit  OT Evaluation  $OT Eval Low Complexity 1 Low  OT Treatments  $Self Care/Home Management  8-22 mins  Written Expression  Dominant Hand Right   Delbert Phenix OT OT pager: [phone]

## 2019-12-26 NOTE — Progress Notes (Signed)
PROGRESS NOTE    Chloe Harding  YJE:[id number] DOB: 02-12-1946 DOA: 12/23/2019 PCP: [name], NP   Brief Narrative:  74 year old with history of DM2, HTN, HLD, CAD, CHF, CKD stage IIIa presents with generalized weakness for 1 week and fall.  Admitted 2 months ago for CHF exacerbation/pneumonia and heme positive stools.  GI work-up negative.  Intermittent behavioral disturbances at home.  Upon admission she was febrile, acute kidney injury.  Started on empiric Rocephin and Flagyl.  Acute kidney injury, hyponatremia hydration status is improving slowly.  Patient's creatinine improved with IV fluids, diarrhea self resolved and antibiotics were discontinued.  PT recommended SNF therefore TOC team consulted to help make arrangements.   Assessment & Plan:   Active Problems:   Type 2 diabetes mellitus (HCC)   ANEMIA   HYPERTENSION, BENIGN ESSENTIAL   CHRONIC KIDNEY DISEASE STAGE I   Sepsis ([place])   Diarrhea  SIRS -This is resolved, cultures remain negative.  UA and chest x-ray negative.  Antibiotics discontinued  Acute kidney injury, improving -Resolved with IV fluids, admission creatinine 2.16.  Today 0.8.  Mild to moderate dehydration Watery diarrhea -CT abdomen pelvis is negative.  Hydration status improved, diarrhea resolved.  Stool studies are also negative.  Altered mental status/encephalopathy, resolved Falls at home, unsteady gait -CT head/C-spine-moderate chronic small vessel changes, no acute pathology, -TSH and ammonia levels-within normal limits -Vitamin D level slightly low -PT/OT-SNF -Possible metabolite of Norco/Flexeril and gabapentin buildup in the setting of AKI.  Hopefully steadiness and mentation will improve as renal function improves.  Positive Hemoccult/melena, resolved -Endoscopy 01/2019-benign esophageal stenosis, hiatal hernia. She is on Iron tab, & plaquenil.  -Colonoscopy 05/2017-3 mm rectosigmoid polyp status post resection, diverticulosis. -I spoke  with Dr. Benson Norway during this admission, she does have history of erosive/severe esophagitis.  Recommend PPI for now.  We will be available for consultation if it becomes necessary.  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  Essential hypertension, uncontrolled -Metoprolol 25 mg twice daily.  Add hydralazine 25 mg 3 times daily -IV hydralazine as needed  Chronic congestive heart failure with reduced ejection fraction, 45% EF, grade 1 diastolic dysfunction -Gentle hydration.  Continue metoprolol.  Supportive care.  Anemia of chronic disease and iron deficiency -Iron supplements  Restless leg syndrome -Ropinirole  Hyperlipidemia -Daily Lipitor  History of depression -Daily Lexapro   DVT prophylaxis: SCDs Code Status: Full code Family Communication:  [name] updated yesterday  Status is: Inpatient  Remains inpatient appropriate because:Inpatient level of care appropriate due to severity of illness   Dispo: The patient is from: Home              Anticipated d/c is to: SNF              Anticipated d/c date is: 1 day              Patient currently is medically stable to be discharged to SNF when bed is available  Body mass index is 33.07 kg/m.     Subjective: Sitting in the chair no complaints.  Feels back to her baseline.  Review of Systems Otherwise negative except as mentioned above  Examination: Constitutional: Not in acute distress Respiratory: Clear to auscultation bilaterally Cardiovascular: Normal sinus rhythm,  no rubs Abdomen: Nontender nondistended good bowel sounds Musculoskeletal: No edema noted Skin: No rashes seen Neurologic: CN 2-12 grossly  intact.  And nonfocal Psychiatric: Normal judgment and insight. Alert and oriented x 3. Normal mood.   Objective: Vitals:   12/25/19 1801 12/25/19 2205 12/26/19 0213 12/26/19 0557  BP: 115/77 120/76 (!) 142/72 (!) 147/73  Pulse: 89 84 85 80  Resp: 17 18 18 18   Temp: 98.1 F (36.7 C) 98 F (36.7 C) 98 F (36.7 C) 98 F (36.7 C)  TempSrc:  Oral    SpO2: 97% 95% 99% 100%  Weight:      Height:        Intake/Output Summary (Last 24 hours) at 12/26/2019 1034 Last data filed at 12/26/2019 0900 Gross per 24 hour  Intake 1948.61 ml  Output 450 ml  Net 1498.61 ml   Filed Weights   12/23/19 1358  Weight: 79.4 kg     Data Reviewed:   CBC: Recent Labs  Lab 12/23/19 1325 12/24/19 0500 12/25/19 0237 12/26/19 0540  WBC 13.2* 6.5 5.7 4.6  NEUTROABS 9.1*  --   --   --   HGB 11.5* 8.9* 8.6* 8.2*  HCT 35.9* 27.8* 26.3* 26.1*  MCV 84.1 85.3 85.4 87.3  PLT 239 164 175 580   Basic Metabolic Panel: Recent Labs  Lab 12/23/19 1325 12/24/19 0500 12/25/19 0237 12/26/19 0540  NA 134* 136 132* 133*  K 4.7 3.8 4.3 3.8  CL 98 107 104 107  CO2 26 21* 22 21*  GLUCOSE 105* 63* 123* 125*  BUN 41* 39* 28* 15  CREATININE 2.16* 1.55* 1.14* 0.82  CALCIUM 9.5 8.2* 8.2* 8.4*  MG  --   --  1.5* 2.1   GFR: Estimated Creatinine Clearance: 59.1 mL/min (by C-G formula based on SCr of 0.82 mg/dL). Liver Function Tests: Recent Labs  Lab 12/23/19 1325 12/25/19 0237 12/26/19 0540  AST 18 13* 14*  ALT 10 8 7   ALKPHOS 121 68 63  BILITOT 0.9 0.5 0.5  PROT 6.9 5.1* 5.4*  ALBUMIN 3.5 2.6* 2.8*   Recent Labs  Lab 12/23/19 1420  LIPASE 22   Recent Labs  Lab 12/24/19 1210  AMMONIA 17   Coagulation Profile: Recent Labs  Lab 12/23/19 1420  INR 1.3*   Cardiac Enzymes: No results for input(s): CKTOTAL, CKMB, CKMBINDEX, TROPONINI in the last 168 hours. BNP (last 3 results) No results for input(s): PROBNP in the last 8760 hours. HbA1C: No results for input(s): HGBA1C in the last  72 hours. CBG: Recent Labs  Lab 12/25/19 0909 12/25/19 1149 12/25/19 1654 12/25/19 2345 12/26/19 0600  GLUCAP 108* 124* 124* 129* 123*   Lipid Profile: No results for input(s): CHOL, HDL, LDLCALC, TRIG, CHOLHDL, LDLDIRECT in the last 72 hours. Thyroid Function Tests: Recent Labs    12/24/19 1210  TSH 0.902   Anemia Panel: Recent Labs    12/24/19 1639  VITAMINB12 404  FERRITIN 84  TIBC 175*  IRON 23*   Sepsis Labs: Recent Labs  Lab 12/23/19 1408 12/23/19 1608  LATICACIDVEN 1.1 1.1    Recent Results (from the past 240 hour(s))  SARS Coronavirus 2 by RT PCR (hospital order, performed in [facility] hospital lab) Nasopharyngeal Nasopharyngeal Swab     Status: None   Collection Time: 12/23/19  2:08 PM   Specimen: Nasopharyngeal Swab  Result Value Ref Range Status   SARS Coronavirus 2 NEGATIVE NEGATIVE Final    Comment: (NOTE) SARS-CoV-2 target nucleic acids are NOT DETECTED.  The SARS-CoV-2 RNA is generally detectable in upper and lower respiratory specimens during the acute phase of infection. The lowest concentration of SARS-CoV-2 viral copies this assay can detect is 250 copies / mL. A negative result does not preclude SARS-CoV-2 infection and should not be used as the sole basis for treatment or other patient management decisions.  A negative result may occur with improper specimen collection / handling, submission of specimen other than nasopharyngeal swab, presence of viral mutation(s) within the areas targeted by this assay, and inadequate number of viral copies (<250 copies / mL). A negative result must be combined with clinical observations, patient history, and epidemiological information.  Fact Sheet for Patients:   StrictlyIdeas.no  Fact Sheet for Healthcare Providers: BankingDealers.co.za  This test is not yet approved or  cleared by the [place] FDA and has been authorized for detection and/or  diagnosis of SARS-CoV-2 by FDA under an Emergency Use Authorization (EUA).  This EUA will remain in effect (meaning this test can be used) for the duration of the COVID-19 declaration under Section 564(b)(1) of the Act, 21 U.S.C. section 360bbb-3(b)(1), unless the authorization is terminated or revoked sooner.  Performed at [facility], [place] [address], [place], [place] [address]   Urine culture     Status: None   Collection Time: 12/23/19  2:20 PM   Specimen: In/Out Cath Urine  Result Value Ref Range Status   Specimen Description   Final    IN/OUT CATH URINE Performed at [place] [address] [place], [place] [address]    Special Requests   Final    NONE Performed at [facility], [place] [address] [place], [place] [address]    Culture   Final    NO GROWTH Performed at [facility], [place] [address], [place], [place] [address]    Report Status 12/25/2019 FINAL  Final  Blood culture (routine x 2)     Status: None (Preliminary result)   Collection Time: 12/23/19  2:21 PM   Specimen: BLOOD  Result Value Ref Range Status   Specimen Description   Final    BLOOD RIGHT ANTECUBITAL Performed at [facility], [place] [address], [place], [place] [address]    Special Requests   Final    BOTTLES DRAWN AEROBIC AND ANAEROBIC Blood Culture results may not be optimal due to an inadequate volume of blood received in culture bottles Performed at [place] [address], [place], [place] [address]    Culture   Final    NO GROWTH 2 DAYS Performed at [place] [address], [place], [place] [address]    Report Status PENDING  Incomplete  Blood culture (routine x 2)     Status: None (Preliminary result)   Collection Time: 12/23/19  2:21 PM   Specimen: BLOOD  Result Value Ref Range Status   Specimen Description   Final    BLOOD LEFT ANTECUBITAL Performed at [facility],  [place] [address] [place], [place] [address]    Special Requests   Final    BOTTLES DRAWN AEROBIC AND ANAEROBIC Blood Culture results may not be optimal due to an inadequate volume of blood received in culture bottles Performed at [place] [address] [place], [place] [address]    Culture   Final    NO GROWTH 2 DAYS Performed at [place] [address], [place], [place]  [address]    Report Status PENDING  Incomplete  MRSA PCR Screening     Status: None   Collection Time: 12/24/19  4:53 PM   Specimen: Nasal Mucosa; Nasopharyngeal  Result Value Ref Range Status   MRSA by PCR NEGATIVE NEGATIVE Final    Comment:        The GeneXpert MRSA Assay (FDA approved for NASAL specimens only), is one component of a comprehensive MRSA colonization surveillance program. It is not intended to diagnose MRSA infection nor to guide or monitor treatment for MRSA infections. Performed at [facility], [place] [address] [place], [place] [address]   Gastrointestinal Panel by PCR , Stool     Status: None   Collection Time: 12/25/19 10:26 AM   Specimen: Stool  Result Value Ref Range Status   Campylobacter species NOT DETECTED NOT DETECTED Final   Plesimonas shigelloides NOT DETECTED NOT DETECTED Final   Salmonella species NOT DETECTED NOT DETECTED Final   Yersinia enterocolitica NOT DETECTED NOT DETECTED Final   Vibrio species NOT DETECTED NOT DETECTED Final   Vibrio cholerae NOT DETECTED NOT DETECTED Final   Enteroaggregative E coli (EAEC) NOT DETECTED NOT DETECTED Final   Enteropathogenic E coli (EPEC) NOT DETECTED NOT DETECTED Final   Enterotoxigenic E coli (ETEC) NOT DETECTED NOT DETECTED Final   Shiga like toxin producing E coli (STEC) NOT DETECTED NOT DETECTED Final   Shigella/Enteroinvasive E coli (EIEC) NOT DETECTED NOT DETECTED Final   Cryptosporidium NOT DETECTED NOT DETECTED Final   Cyclospora cayetanensis NOT DETECTED NOT DETECTED Final    Entamoeba histolytica NOT DETECTED NOT DETECTED Final   Giardia lamblia NOT DETECTED NOT DETECTED Final   Adenovirus F40/41 NOT DETECTED NOT DETECTED Final   Astrovirus NOT DETECTED NOT DETECTED Final   Norovirus GI/GII NOT DETECTED NOT DETECTED Final   Rotavirus A NOT DETECTED NOT DETECTED Final   Sapovirus (I, II, IV, and V) NOT DETECTED NOT DETECTED Final    Comment: Performed at [facility], [place] [place], [place] [address]  C Difficile Quick Screen w PCR reflex     Status: None   Collection Time: 12/25/19 10:26 AM   Specimen: STOOL  Result Value Ref Range Status   C Diff antigen NEGATIVE NEGATIVE Final   C Diff toxin NEGATIVE NEGATIVE Final   C Diff interpretation No C. difficile detected.  Final    Comment: Performed at [facility], [place] [address], [place], [place] [address]         Radiology Studies: No results found.      Scheduled Meds: . atorvastatin  20 mg Oral Daily  . Chlorhexidine Gluconate Cloth  6 each Topical Daily  . cholecalciferol  1,000 Units Oral Daily  . cyclobenzaprine  10 mg Oral TID  . escitalopram  10 mg Oral Daily  . famotidine  20 mg Oral BID  . ferrous sulfate  325 mg Oral BID WC  . folic acid  1 mg Oral Daily  . gabapentin  400 mg Oral TID  . hydrALAZINE  25 mg Oral Q8H  . hydroxychloroquine  200 mg Oral Daily  . metoprolol tartrate  25 mg Oral BID  . pantoprazole  40 mg Oral BID AC  . potassium chloride  10 mEq Oral Daily  . ropinirole  5 mg Oral QHS  . senna-docusate  1 tablet Oral QHS  . sucralfate  1 g Oral TID WC & HS   Continuous Infusions: . dextrose 5 %  and 0.45% NaCl 75 mL/hr at 12/26/19 0628     LOS: 3 days   Time spent= 35 mins    Khylen Riolo Arsenio Loader, MD [facility]  If 7PM-7AM, please contact night-coverage  12/26/2019, 10:34 AM

## 2019-12-27 LAB — COMPREHENSIVE METABOLIC PANEL
ALT: 10 U/L (ref 0–44)
AST: 19 U/L (ref 15–41)
Albumin: 2.8 g/dL — ABNORMAL LOW (ref 3.5–5.0)
Alkaline Phosphatase: 54 U/L (ref 38–126)
Anion gap: 5 (ref 5–15)
BUN: 9 mg/dL (ref 8–23)
CO2: 22 mmol/L (ref 22–32)
Calcium: 8.3 mg/dL — ABNORMAL LOW (ref 8.9–10.3)
Chloride: 107 mmol/L (ref 98–111)
Creatinine, Ser: 0.76 mg/dL (ref 0.44–1.00)
GFR calc Af Amer: >60 mL/min (ref >60)
GFR calc non Af Amer: >60 mL/min (ref >60)
Glucose, Bld: 83 mg/dL (ref 70–99)
Potassium: 4.1 mmol/L (ref 3.5–5.1)
Sodium: 134 mmol/L — ABNORMAL LOW (ref 135–145)
Total Bilirubin: 0.4 mg/dL (ref 0.3–1.2)
Total Protein: 5.3 g/dL — ABNORMAL LOW (ref 6.5–8.1)

## 2019-12-27 LAB — CBC
HCT: 25.2 % — ABNORMAL LOW (ref 36.0–46.0)
Hemoglobin: 8.1 g/dL — ABNORMAL LOW (ref 12.0–15.0)
MCH: 27.6 pg (ref 26.0–34.0)
MCHC: 32.1 g/dL (ref 30.0–36.0)
MCV: 86 fL (ref 80.0–100.0)
Platelets: 173 10*3/uL (ref 150–400)
RBC: 2.93 MIL/uL — ABNORMAL LOW (ref 3.87–5.11)
RDW: 17.5 % — ABNORMAL HIGH (ref 11.5–15.5)
WBC: 4.2 10*3/uL (ref 4.0–10.5)
nRBC: 0 % (ref 0.0–0.2)

## 2019-12-27 LAB — GLUCOSE, CAPILLARY: Glucose-Capillary: 81 mg/dL (ref 70–99)

## 2019-12-27 LAB — MAGNESIUM: Magnesium: 1.9 mg/dL (ref 1.7–2.4)

## 2019-12-27 MED ORDER — VITAMIN D3 25 MCG PO TABS: 1000.0000 [IU] | ORAL_TABLET | Freq: Every day | ORAL | 0 refills | Status: DC

## 2019-12-27 MED ORDER — HYDROCODONE-ACETAMINOPHEN 5-325 MG PO TABS: 1.0000 | ORAL_TABLET | Freq: Four times a day (QID) | ORAL | 0 refills | Status: DC | PRN

## 2019-12-27 MED ORDER — VITAMIN D3 25 MCG PO TABS: 1000.0000 [IU] | ORAL_TABLET | Freq: Every day | ORAL | Status: DC

## 2019-12-27 NOTE — Progress Notes (Signed)
Physical Therapy Treatment Patient Details Name: Chloe Harding MRN: [id number] DOB: Jun 02, 1945 Today's Date: 12/27/2019    History of Present Illness 74 year old with history of DM2, HTN, HLD, CAD, CHF, CKD stage IIIa presents with generalized weakness for 1 week and fall.  Admitted 2 months ago for CHF exacerbation/pneumonia and heme positive stools. Intermittent behavioral disturbances at home.  Upon admission she was febrile, acute kidney injury, hyponatremia.    PT Comments    Pt pleasant, agreeable to exercises but declines sitting EOB or transfers. Pt able to perform therapeutic exercises with increased time and cues for form, demonstrating fatigue requiring therapeutic rest breaks between exercises. RN entered room at [facility] reporting pt ready for discharge so session ended and wished pt well with recovery. Patient will benefit from continued physical therapy in hospital and recommendations below to increase strength, balance, endurance for safe ADLs and gait.   Follow Up Recommendations  Home health PT;SNF     Equipment Recommendations  None recommended by PT    Recommendations for Other Services       Precautions / Restrictions Precautions Precautions: Fall Restrictions Weight Bearing Restrictions: No    Mobility  Bed Mobility  General bed mobility comments: in bed with HOB elevated, declines to come to sitting EOB  Transfers   Ambulation/Gait   Stairs             Wheelchair Mobility    Modified Rankin (Stroke Patients Only)       Balance           Cognition Arousal/Alertness: Awake/alert Behavior During Therapy: Flat affect Overall Cognitive Status: No family/caregiver present to determine baseline cognitive functioning Area of Impairment: Following commands;Problem solving  Following Commands: Follows one step commands with increased time  Problem Solving: Requires verbal cues;Requires tactile cues General Comments: pt follows commands with  increased time and cues      Exercises General Exercises - Lower Extremity Short Arc Quad: AROM;Strengthening;Supine;Both;5 reps Heel Slides: AROM;Strengthening;Supine;Both;10 reps Hip ABduction/ADduction: AROM;Strengthening;Supine;Both;10 reps Straight Leg Raises: AROM;Strengthening;Supine;Both;5 reps    General Comments        Pertinent Vitals/Pain Pain Assessment: No/denies pain    Home Living                      Prior Function            PT Goals (current goals can now be found in the care plan section) Acute Rehab PT Goals Patient Stated Goal: to go home PT Goal Formulation: With patient Time For Goal Achievement: 01/08/20 Potential to Achieve Goals: Fair Progress towards PT goals: Progressing toward goals    Frequency    Min 3X/week      PT Plan Current plan remains appropriate    Co-evaluation              AM-PAC PT "6 Clicks" Mobility   Outcome Measure  Help needed turning from your back to your side while in a flat bed without using bedrails?: A Lot Help needed moving from lying on your back to sitting on the side of a flat bed without using bedrails?: A Lot Help needed moving to and from a bed to a chair (including a wheelchair)?: A Lot Help needed standing up from a chair using your arms (e.g., wheelchair or bedside chair)?: A Lot Help needed to walk in hospital room?: A Lot Help needed climbing 3-5 steps with a railing? : Total 6 Click Score: 11    End of  Session   Activity Tolerance: Patient limited by fatigue Patient left: in bed;with call bell/phone within reach;with nursing/sitter in room Nurse Communication: Mobility status PT Visit Diagnosis: Unsteadiness on feet (R26.81);Difficulty in walking, not elsewhere classified (R26.2)     Time: 8916-9450 PT Time Calculation (min) (ACUTE ONLY): 9 min  Charges:  $Therapeutic Exercise: 8-22 mins                     Tori Avelino Herren PT, DPT 12/27/19, 2:07 PM

## 2019-12-27 NOTE — Progress Notes (Signed)
Pt is being discharged. RN phoned family Aram Candela) to advise of discharge. Will arrive in 20-30 minutes to pick up pt. Discharge instructions including medications and follow up appointments placed in packet for family.

## 2019-12-27 NOTE — Discharge Summary (Signed)
Physician Discharge Summary  Chloe Harding JXB:[id number] DOB: [birth year] DOA: 12/23/2019  PCP: [name], NP  Admit date: 12/23/2019 Discharge date: 12/27/2019  Admitted From: Home Disposition: Home  Recommendations for Outpatient Follow-up:  1. Follow up with PCP in 1-2 weeks 2. Please obtain BMP/CBC in one week your next doctors visit.  3. Vitamin D supplements prescribed  Home Health: PT/OT/aide Equipment/Devices: 3 and 1, walker Discharge Condition: Stable CODE STATUS: Full code Diet recommendation: Heart healthy/diabetic  Brief/Interim Summary: 74 year old with history of DM2, HTN, HLD, CAD, CHF, CKD stage IIIa presents with generalized weakness for 1 week and fall.  Admitted 2 months ago for CHF exacerbation/pneumonia and heme positive stools.  GI work-up negative.  Intermittent behavioral disturbances at home.  Upon admission she was febrile, acute kidney injury.  Started on empiric Rocephin and Flagyl.  Acute kidney injury, hyponatremia hydration status is improving slowly.  Patient's creatinine improved with IV fluids, diarrhea self resolved and antibiotics were discontinued.    Home health arrangements were made as patient significantly improved.  Today she has remained medically stable to be discharged with outpatient follow-up as recommended above.   Assessment & Plan:   Active Problems:   Type 2 diabetes mellitus (HCC)   ANEMIA   HYPERTENSION, BENIGN ESSENTIAL   CHRONIC KIDNEY DISEASE STAGE I   Sepsis ([place])   Diarrhea  SIRS, resolved -This is resolved, cultures remain negative.  UA and chest x-ray negative.  Antibiotics discontinued  Acute kidney injury, resolved -Resolved with IV fluids, admission creatinine 2.16.  At baseline of 0.7  Mild to moderate dehydration, resolved Watery diarrhea -CT abdomen pelvis is negative.  Hydration status improved, diarrhea resolved.  Stool studies are also negative.  Altered mental status/encephalopathy,  resolved Falls at home, unsteady gait -CT head/C-spine-moderate chronic small vessel changes, no acute pathology, -TSH and ammonia levels-within normal limits -Vitamin D level slightly low -PT/OT-SNF but arrangements for home health services made  Positive Hemoccult/melena, resolved -Endoscopy 01/2019-benign esophageal stenosis, hiatal hernia. She is on Iron tab, & plaquenil.  -Colonoscopy 05/2017-3 mm rectosigmoid polyp status post resection, diverticulosis. -I spoke with Dr. [place] Norway during this admission, she does have history of erosive/severe esophagitis.  Continue PPI discharge.  We will be available for consultation if it becomes necessary.  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  Essential hypertension, uncontrolled -Metoprolol 25 mg twice daily.    This can be adjusted outpatient by PCP.  Advised to keep a log of this.  Chronic congestive heart failure with reduced ejection fraction, 45% EF, grade 1 diastolic dysfunction -continue home metoprolol  Anemia of chronic disease and iron deficiency -Iron supplements, as needed bowel regimen  Restless leg syndrome -Ropinirole  Hyperlipidemia -Daily Lipitor  History of depression -Daily Lexapro   Body mass index is 33.07 kg/m.         Discharge Diagnoses:  Active Problems:   Type 2 diabetes mellitus (HCC)   ANEMIA   HYPERTENSION, BENIGN ESSENTIAL   CHRONIC KIDNEY DISEASE STAGE I   Sepsis ([place])   Diarrhea   Subjective: Patient denies any complaints at this time.  She is sitting on her bed eating her breakfast without any issues.  Wants to go home.  Discharge Exam: Vitals:   12/26/19 1938 12/27/19 0334  BP: 140/65 139/71  Pulse: 81 79  Resp: 15  15  Temp: (!) 97.5 F (36.4 C)  98.1 F (36.7 C)  SpO2: 100% 100%   Vitals:   12/26/19 0557 12/26/19 1351 12/26/19 1938 12/27/19 0334  BP: (!) 147/73 (!) 164/77 140/65 139/71  Pulse: 80 86 81 79  Resp: 18 14 15 15   Temp: 98 F (36.7 C) 98 F (36.7 C) (!) 97.5 F (36.4 C) 98.1 F (36.7 C)  TempSrc:  Oral Oral Oral  SpO2: 100% 100% 100% 100%  Weight:      Height:        General: Pt is alert, awake, not in acute distress Cardiovascular: RRR, S1/S2 +, no rubs, no gallops Respiratory: CTA bilaterally, no wheezing, no rhonchi Abdominal: Soft, NT, ND, bowel sounds + Extremities: no edema, no cyanosis  Discharge Instructions   Allergies as of 12/27/2019      Reactions   Amlodipine Besylate Swelling   swelling      Medication List    TAKE these medications   amitriptyline 25 MG tablet Commonly known as: ELAVIL Take 25 mg by mouth at bedtime.   atorvastatin 20 MG tablet Commonly known as: LIPITOR Take 1 tablet (20 mg total) by mouth daily.   bisacodyl 5 MG EC tablet Commonly known as: DULCOLAX Take 1 tablet (5 mg total) by mouth daily as needed for moderate constipation.   Constulose 10 GM/15ML solution Generic drug: lactulose Take 10 g by mouth daily as needed for mild constipation.   cyclobenzaprine 10 MG tablet Commonly known as: FLEXERIL Take 10 mg by mouth 3 (three) times daily.   escitalopram 10 MG tablet Commonly known as: LEXAPRO Take 10 mg by mouth daily.   ferrous sulfate 325 (65 FE) MG tablet Take 1 tablet (325 mg total) by mouth 2 (two) times daily with a meal.   folic acid 1 MG tablet Commonly known as: FOLVITE Take 1 mg by mouth daily.   furosemide 40 MG tablet Commonly known as: LASIX Take 1 tablet (40 mg total) by mouth daily.   gabapentin 400 MG capsule Commonly known as: NEURONTIN Take 400 mg by mouth 3 (three) times daily.   HYDROcodone-acetaminophen 5-325 MG tablet Commonly known as: NORCO/VICODIN Take 1 tablet by mouth every 6 (six) hours as needed for  moderate pain.   hydroxychloroquine 200 MG tablet Commonly known as: PLAQUENIL Take 200 mg by mouth daily.   Januvia 100 MG tablet Generic drug: sitaGLIPtin Take 100 mg by mouth daily.   metoprolol tartrate 25 MG tablet Commonly known as: LOPRESSOR Take 1 tablet (25 mg total) by mouth 2 (two) times daily.   pantoprazole 40 MG tablet Commonly known as: Protonix Take 1 tablet (40 mg total) by mouth 2 (two) times daily.   polyethylene glycol 17 g packet Commonly known as: MIRALAX / GLYCOLAX Take 17 g by mouth daily as needed for moderate constipation or severe constipation.   potassium chloride 10 MEQ tablet Commonly known as: KLOR-CON Take 10 mEq by mouth daily.   ropinirole 5 MG tablet Commonly known as: REQUIP Take 5 mg by mouth at bedtime.   senna-docusate 8.6-50 MG tablet Commonly known as: Senokot-S Take 1 tablet by mouth at bedtime.   Vitamin D3 25 MCG tablet Commonly known as: Vitamin D Take 1 tablet (1,000 Units total) by mouth daily.            Durable Medical Equipment  (From admission, onward)         Start     Ordered   12/27/19 1043  For home use only DME 3 n 1  Once        12/27/19 1042   12/27/19 1042  For home use only DME Walker  Once       Question:  Patient needs a walker to treat with the following condition  Answer:  Ambulatory dysfunction   12/27/19 1042          Follow-up Information    [name], NP. Schedule an appointment as soon as possible for a visit in 2 week(s).   Specialty: Nurse Practitioner Contact information: [place] [address] [phone]        [name], MD .   Specialty: Cardiology Contact information: [address] [place] [address] [phone]              Allergies  Allergen Reactions  . Amlodipine Besylate Swelling    swelling    You were cared for by a hospitalist during your hospital stay. If you have any questions about your discharge  medications or the care you received while you were in the hospital after you are discharged, you can call the unit and asked to speak with the hospitalist on call if the hospitalist that took care of you is not available. Once you are discharged, your primary care physician will handle any further medical issues. Please note that no refills for any discharge medications will be authorized once you are discharged, as it is imperative that you return to your primary care physician (or establish a relationship with a primary care physician if you do not have one) for your aftercare needs so that they can reassess your need for medications and monitor your lab values.   Procedures/Studies: CT Abdomen Pelvis Wo Contrast  Result Date: 12/23/2019 CLINICAL DATA:  Confused and  unstable EXAM: CT ABDOMEN AND PELVIS WITHOUT CONTRAST TECHNIQUE: Multidetector CT imaging of the abdomen and pelvis was performed following the standard protocol without IV contrast. COMPARISON:  None. FINDINGS: Lower chest: Lung bases are clear. Hepatobiliary: No focal hepatic lesion. Postcholecystectomy. No biliary dilatation. Pancreas: Pancreas is normal. No ductal dilatation. No pancreatic inflammation. Spleen: Normal spleen Adrenals/urinary tract: Adrenal glands and kidneys are normal. The ureters and bladder normal. Stomach/Bowel: Small hiatal hernia. Stomach, duodenum and small-bowel normal. Ascending, transverse and descending colon normal. Vascular/Lymphatic: Abdominal aorta is normal caliber with atherosclerotic calcification. There is no retroperitoneal or periportal lymphadenopathy. No pelvic lymphadenopathy. Reproductive: Post hysterectomy.  Adnexa unremarkable Other: No free fluid. Musculoskeletal: Degenerative changes of the spine. No acute findings. IMPRESSION: 1. No acute abdominopelvic findings on noncontrast exam. 2. No bowel obstruction or inflammation identified. 3.  Aortic Atherosclerosis (ICD10-I70.0). Electronically  Signed   By: Suzy Bouchard M.D.   On: 12/23/2019 16:44   DG Chest 2 View  Result Date: 12/23/2019 CLINICAL DATA:  Multiple falls EXAM: CHEST - 2 VIEW COMPARISON:  Radiograph 11/07/2019 FINDINGS: Normal cardiac silhouette. Low lung volumes. Chronic elevation RIGHT hemidiaphragm. No effusion, infiltrate, or pneumothorax. No acute osseous abnormality. IMPRESSION: 1. No acute cardiopulmonary findings.  No evidence trauma. 2. Low lung volumes similar to prior. Electronically Signed   By: Suzy Bouchard M.D.   On: 12/23/2019 14:47   CT Head Wo Contrast  Result Date: 12/23/2019 CLINICAL DATA:  Multiple recent falls including today with left forehead injury. Posterior neck pain. EXAM: CT HEAD WITHOUT CONTRAST CT CERVICAL SPINE WITHOUT CONTRAST TECHNIQUE: Multidetector CT imaging of the head and cervical spine was performed following the standard protocol without  intravenous contrast. Multiplanar CT image reconstructions of the cervical spine were also generated. COMPARISON:  05/18/2017 head and cervical spine CT. FINDINGS: CT HEAD FINDINGS Brain: No evidence of parenchymal hemorrhage or extra-axial fluid collection. No mass lesion, mass effect, or midline shift. No CT evidence of acute infarction. Nonspecific moderate subcortical and periventricular white matter hypodensity, most in keeping with chronic small vessel ischemic change. Cerebral volume is age appropriate. No ventriculomegaly. Vascular: No acute abnormality. Skull: No evidence of calvarial fracture. Sinuses/Orbits: Tiny fluid level in the left maxillary sinus. Other:  The mastoid air cells are unopacified. CT CERVICAL SPINE FINDINGS Alignment: Straightening of the cervical spine. No facet subluxation. Dens is well positioned between the lateral masses of C1. Skull base and vertebrae: No acute fracture. No primary bone lesion or focal pathologic process. Soft tissues and spinal canal: No prevertebral edema. No visible canal hematoma. Disc levels: Mild  multilevel degenerative disc disease, most prominent at C5-6. Mild bilateral facet arthropathy. No significant degenerative foraminal stenosis. Upper chest: No acute abnormality. Other: Visualized mastoid air cells appear clear. Hypodense stable 3.1 cm right thyroid nodule. No pathologically enlarged cervical nodes. IMPRESSION: 1. No evidence of acute intracranial abnormality. No evidence of calvarial fracture. 2. Moderate chronic small vessel ischemic changes in the cerebral white matter. 3. No cervical spine fracture or subluxation. 4. Mild multilevel degenerative changes in the cervical spine as detailed. 5. Tiny fluid level in the left maxillary sinus, nonspecific, correlate for acute sinusitis. 6. Stable 3.1 cm right thyroid nodule. Recommend thyroid US if not previously performed (ref: J Am Coll Radiol. 2015 Feb;12(2): 143-50). Electronically Signed   By: Ilona Sorrel M.D.   On: 12/23/2019 14:42   CT Cervical Spine Wo Contrast  Result Date: 12/23/2019 CLINICAL DATA:  Multiple recent falls including today with left forehead injury. Posterior neck pain. EXAM: CT HEAD WITHOUT CONTRAST CT CERVICAL SPINE WITHOUT CONTRAST TECHNIQUE: Multidetector CT imaging of the head and cervical spine was performed following the standard protocol without intravenous contrast. Multiplanar CT image reconstructions of the cervical spine were also generated. COMPARISON:  05/18/2017 head and cervical spine CT. FINDINGS: CT HEAD FINDINGS Brain: No evidence of parenchymal hemorrhage or extra-axial fluid collection. No mass lesion, mass effect, or midline shift. No CT evidence of acute infarction. Nonspecific moderate subcortical and periventricular white matter hypodensity, most in keeping with chronic small vessel ischemic change. Cerebral volume is age appropriate. No ventriculomegaly. Vascular: No acute abnormality. Skull: No evidence of calvarial fracture. Sinuses/Orbits: Tiny fluid level in the left maxillary sinus. Other:  The  mastoid air cells are unopacified. CT CERVICAL SPINE FINDINGS Alignment: Straightening of the cervical spine. No facet subluxation. Dens is well positioned between the lateral masses of C1. Skull base and vertebrae: No acute fracture. No primary bone lesion or focal pathologic process. Soft tissues and spinal canal: No prevertebral edema. No visible canal hematoma. Disc levels: Mild multilevel degenerative disc disease, most prominent at C5-6. Mild bilateral facet arthropathy. No significant degenerative foraminal stenosis. Upper chest: No acute abnormality. Other: Visualized mastoid air cells appear clear. Hypodense stable 3.1 cm right thyroid nodule. No pathologically enlarged cervical nodes. IMPRESSION: 1. No evidence of acute intracranial abnormality. No evidence of calvarial fracture. 2. Moderate chronic small vessel ischemic changes in the cerebral white matter. 3. No cervical spine fracture or subluxation. 4. Mild multilevel degenerative changes in the cervical spine as detailed. 5. Tiny fluid level in the left maxillary sinus, nonspecific, correlate for acute sinusitis. 6. Stable 3.1 cm right thyroid nodule.  Recommend thyroid US if not previously performed (ref: J Am Coll Radiol. 2015 Feb;12(2): 143-50). Electronically Signed   By: Ilona Sorrel M.D.   On: 12/23/2019 14:42      The results of significant diagnostics from this hospitalization (including imaging, microbiology, ancillary and laboratory) are listed below for reference.     Microbiology: Recent Results (from the past 240 hour(s))  SARS Coronavirus 2 by RT PCR (hospital order, performed in [facility] hospital lab) Nasopharyngeal Nasopharyngeal Swab     Status: None   Collection Time: 12/23/19  2:08 PM   Specimen: Nasopharyngeal Swab  Result Value Ref Range Status   SARS Coronavirus 2 NEGATIVE NEGATIVE Final    Comment: (NOTE) SARS-CoV-2 target nucleic acids are NOT DETECTED.  The SARS-CoV-2 RNA is generally detectable in upper  and lower respiratory specimens during the acute phase of infection. The lowest concentration of SARS-CoV-2 viral copies this assay can detect is 250 copies / mL. A negative result does not preclude SARS-CoV-2 infection and should not be used as the sole basis for treatment or other patient management decisions.  A negative result may occur with improper specimen collection / handling, submission of specimen other than nasopharyngeal swab, presence of viral mutation(s) within the areas targeted by this assay, and inadequate number of viral copies (<250 copies / mL). A negative result must be combined with clinical observations, patient history, and epidemiological information.  Fact Sheet for Patients:   StrictlyIdeas.no  Fact Sheet for Healthcare Providers: BankingDealers.co.za  This test is not yet approved or  cleared by the [place] FDA and has been authorized for detection and/or diagnosis of SARS-CoV-2 by FDA under an Emergency Use Authorization (EUA).  This EUA will remain in effect (meaning this test can be used) for the duration of the COVID-19 declaration under Section 564(b)(1) of the Act, 21 U.S.C. section 360bbb-3(b)(1), unless the authorization is terminated or revoked sooner.  Performed at [facility], [place] [address] [place], [place] [address]   Urine culture     Status: None   Collection Time: 12/23/19  2:20 PM   Specimen: In/Out Cath Urine  Result Value Ref Range Status   Specimen Description   Final    IN/OUT CATH URINE Performed at [place] [address], [place], [place] [address]    Special Requests   Final    NONE Performed at [facility], [place] [address], [place], [place] [address]    Culture   Final    NO GROWTH Performed at [facility], [place] [address] [place], [place] [address]    Report Status 12/25/2019 FINAL  Final  Blood  culture (routine x 2)     Status: None (Preliminary result)   Collection Time: 12/23/19  2:21 PM   Specimen: BLOOD  Result Value Ref Range Status   Specimen Description   Final    BLOOD RIGHT ANTECUBITAL Performed at [facility], [place] [address] [place], [place] [address]    Special Requests   Final    BOTTLES DRAWN AEROBIC AND ANAEROBIC Blood Culture results may not be optimal due to an inadequate volume of blood received in culture bottles Performed at [place] [address] [place], [place] [address]    Culture   Final    NO GROWTH 3 DAYS Performed at [facility], [place] [address], [place], [place] [address]    Report Status PENDING  Incomplete  Blood  culture (routine x 2)     Status: None (Preliminary result)   Collection Time: 12/23/19  2:21 PM   Specimen: BLOOD  Result Value Ref Range Status   Specimen Description   Final    BLOOD LEFT ANTECUBITAL Performed at [facility], [place] [address] [place], [place] [address]    Special Requests   Final    BOTTLES DRAWN AEROBIC AND ANAEROBIC Blood Culture results may not be optimal due to an inadequate volume of blood received in culture bottles Performed at [place] [address] [place], [place] [address]    Culture   Final    NO GROWTH 3 DAYS Performed at [facility], [place] [address] [place], [place] [address]    Report Status PENDING  Incomplete  MRSA PCR Screening     Status: None   Collection Time: 12/24/19  4:53 PM   Specimen: Nasal Mucosa; Nasopharyngeal  Result Value Ref Range Status   MRSA by PCR NEGATIVE NEGATIVE Final    Comment:        The GeneXpert MRSA Assay (FDA approved for NASAL specimens only), is one component of a comprehensive MRSA colonization surveillance program. It is not intended to diagnose MRSA infection nor to guide or monitor treatment for MRSA infections. Performed at [facility], [place]  [address] [place], [place] [address]   Gastrointestinal Panel by PCR , Stool     Status: None   Collection Time: 12/25/19 10:26 AM   Specimen: Stool  Result Value Ref Range Status   Campylobacter species NOT DETECTED NOT DETECTED Final   Plesimonas shigelloides NOT DETECTED NOT DETECTED Final   Salmonella species NOT DETECTED NOT DETECTED Final   Yersinia enterocolitica NOT DETECTED NOT DETECTED Final   Vibrio species NOT DETECTED NOT DETECTED Final   Vibrio cholerae NOT DETECTED NOT DETECTED Final   Enteroaggregative E coli (EAEC) NOT DETECTED NOT DETECTED Final   Enteropathogenic E coli (EPEC) NOT DETECTED NOT DETECTED Final   Enterotoxigenic E coli (ETEC) NOT DETECTED NOT DETECTED Final   Shiga like toxin producing E coli (STEC) NOT DETECTED NOT DETECTED Final   Shigella/Enteroinvasive E coli (EIEC) NOT DETECTED NOT DETECTED Final   Cryptosporidium NOT DETECTED NOT DETECTED Final   Cyclospora cayetanensis NOT DETECTED NOT DETECTED Final   Entamoeba histolytica NOT DETECTED NOT DETECTED Final   Giardia lamblia NOT DETECTED NOT DETECTED Final   Adenovirus F40/41 NOT DETECTED NOT DETECTED Final   Astrovirus NOT DETECTED NOT DETECTED Final   Norovirus GI/GII NOT DETECTED NOT DETECTED Final   Rotavirus A NOT DETECTED NOT DETECTED Final   Sapovirus (I, II, IV, and V) NOT DETECTED NOT DETECTED Final    Comment: Performed at [facility], [address] [place], [place] [address]  C Difficile Quick Screen w PCR reflex     Status: None   Collection Time: 12/25/19 10:26 AM   Specimen: STOOL  Result Value Ref Range Status   C Diff antigen NEGATIVE NEGATIVE Final   C Diff toxin NEGATIVE NEGATIVE Final   C Diff interpretation No C. difficile detected.  Final    Comment: Performed at [facility], [place] [address], [place], [place] [address]     Labs: BNP (last 3 results) Recent Labs    11/05/19 1821 11/09/19 0337  BNP 719.2* 0,350.0*   Basic Metabolic  Panel: Recent Labs  Lab 12/23/19 1325 12/24/19 0500 12/25/19 0237 12/26/19 0540 12/27/19 0430  NA 134*  136 132* 133* 134*  K 4.7 3.8 4.3 3.8 4.1  CL 98 107 104 107 107  CO2 26 21* 22 21* 22  GLUCOSE 105* 63* 123* 125* 83  BUN 41* 39* 28* 15 9  CREATININE 2.16* 1.55* 1.14* 0.82 0.76  CALCIUM 9.5 8.2* 8.2* 8.4* 8.3*  MG  --   --  1.5* 2.1 1.9   Liver Function Tests: Recent Labs  Lab 12/23/19 1325 12/25/19 0237 12/26/19 0540 12/27/19 0430  AST 18 13* 14* 19  ALT 10 8 7 10   ALKPHOS 121 68 63 54  BILITOT 0.9 0.5 0.5 0.4  PROT 6.9 5.1* 5.4* 5.3*  ALBUMIN 3.5 2.6* 2.8* 2.8*   Recent Labs  Lab 12/23/19 1420  LIPASE 22   Recent Labs  Lab 12/24/19 1210  AMMONIA 17   CBC: Recent Labs  Lab 12/23/19 1325 12/23/19 1325 12/24/19 0500 12/24/19 1639 12/25/19 0237 12/26/19 0540 12/27/19 0430  WBC 13.2*  --  6.5  --  5.7 4.6 4.2  NEUTROABS 9.1*  --   --   --   --   --   --   HGB 11.5*  --  8.9*  --  8.6* 8.2* 8.1*  HCT 35.9*   < > 27.8* 28.8* 26.3* 26.1* 25.2*  MCV 84.1  --  85.3  --  85.4 87.3 86.0  PLT 239  --  164  --  175 183 173   < > = values in this interval not displayed.   Cardiac Enzymes: No results for input(s): CKTOTAL, CKMB, CKMBINDEX, TROPONINI in the last 168 hours. BNP: Invalid input(s): POCBNP CBG: Recent Labs  Lab 12/26/19 0600 12/26/19 1202 12/26/19 1819 12/26/19 2340 12/27/19 0537  GLUCAP 123* 126* 141* 84 81   D-Dimer No results for input(s): DDIMER in the last 72 hours. Hgb A1c No results for input(s): HGBA1C in the last 72 hours. Lipid Profile No results for input(s): CHOL, HDL, LDLCALC, TRIG, CHOLHDL, LDLDIRECT in the last 72 hours. Thyroid function studies Recent Labs    12/24/19 1210  TSH 0.902   Anemia work up Recent Labs    12/24/19 1639  VITAMINB12 404  FERRITIN 84  TIBC 175*  IRON 23*   Urinalysis    Component Value Date/Time   COLORURINE YELLOW 12/23/2019 1330   APPEARANCEUR CLEAR 12/23/2019 1330   LABSPEC  1.014 12/23/2019 1330   PHURINE 5.0 12/23/2019 1330   GLUCOSEU NEGATIVE 12/23/2019 1330   [place] 12/23/2019 1330   HGBUR trace-intact 05/14/2010 1402   BILIRUBINUR NEGATIVE 12/23/2019 1330   KETONESUR NEGATIVE 12/23/2019 1330   PROTEINUR NEGATIVE 12/23/2019 1330   UROBILINOGEN 1.0 03/30/2015 1540   NITRITE NEGATIVE 12/23/2019 1330   LEUKOCYTESUR NEGATIVE 12/23/2019 1330   Sepsis Labs Invalid input(s): PROCALCITONIN,  WBC,  LACTICIDVEN Microbiology Recent Results (from the past 240 hour(s))  SARS Coronavirus 2 by RT PCR (hospital order, performed in [facility] hospital lab) Nasopharyngeal Nasopharyngeal Swab     Status: None   Collection Time: 12/23/19  2:08 PM   Specimen: Nasopharyngeal Swab  Result Value Ref Range Status   SARS Coronavirus 2 NEGATIVE NEGATIVE Final    Comment: (NOTE) SARS-CoV-2 target nucleic acids are NOT DETECTED.  The SARS-CoV-2 RNA is generally detectable in upper and lower respiratory specimens during the acute phase of infection. The lowest concentration of SARS-CoV-2 viral copies this assay can detect is 250 copies / mL. A negative result does not preclude SARS-CoV-2 infection and should not be used as the sole basis  for treatment or other patient management decisions.  A negative result may occur with improper specimen collection / handling, submission of specimen other than nasopharyngeal swab, presence of viral mutation(s) within the areas targeted by this assay, and inadequate number of viral copies (<250 copies / mL). A negative result must be combined with clinical observations, patient history, and epidemiological information.  Fact Sheet for Patients:   StrictlyIdeas.no  Fact Sheet for Healthcare Providers: BankingDealers.co.za  This test is not yet approved or  cleared by the [place] FDA and has been authorized for detection and/or diagnosis of SARS-CoV-2 by FDA under an  Emergency Use Authorization (EUA).  This EUA will remain in effect (meaning this test can be used) for the duration of the COVID-19 declaration under Section 564(b)(1) of the Act, 21 U.S.C. section 360bbb-3(b)(1), unless the authorization is terminated or revoked sooner.  Performed at [facility], [place] [address] [place], [place] [address]   Urine culture     Status: None   Collection Time: 12/23/19  2:20 PM   Specimen: In/Out Cath Urine  Result Value Ref Range Status   Specimen Description   Final    IN/OUT CATH URINE Performed at [facility] [address] [place], [place] [address]    Special Requests   Final    NONE Performed at [facility], [place] [address] [place], [place] [address]    Culture   Final    NO GROWTH Performed at [facility], [place] [address] [place], [place] [address]    Report Status 12/25/2019 FINAL  Final  Blood culture (routine x 2)     Status: None (Preliminary result)   Collection Time: 12/23/19  2:21 PM   Specimen: BLOOD  Result Value Ref Range Status   Specimen Description   Final    BLOOD RIGHT ANTECUBITAL Performed at [facility], [place] [address], [place], [place] [address]    Special Requests   Final    BOTTLES DRAWN AEROBIC AND ANAEROBIC Blood Culture results may not be optimal due to an inadequate volume of blood received in culture bottles Performed at [place] [address] [place], [place] [address]    Culture   Final    NO GROWTH 3 DAYS Performed at [facility], [place] [address], [place], [place] [address]    Report Status PENDING  Incomplete  Blood culture (routine x 2)     Status: None (Preliminary result)   Collection Time: 12/23/19  2:21 PM   Specimen: BLOOD  Result Value Ref Range Status   Specimen Description   Final    BLOOD LEFT ANTECUBITAL Performed at [facility], [place] [address] [place], [place] [address]     Special Requests   Final    BOTTLES DRAWN AEROBIC AND ANAEROBIC Blood Culture results may not be optimal due to an inadequate volume of blood received in culture bottles Performed at [place] [address] [place], [place] [address]    Culture   Final    NO GROWTH 3 DAYS Performed at [facility], [place] [address], [place], [place] [address]    Report Status PENDING  Incomplete  MRSA PCR Screening     Status: None   Collection Time: 12/24/19  4:53 PM   Specimen: Nasal Mucosa; Nasopharyngeal  Result Value Ref Range Status   MRSA by PCR NEGATIVE NEGATIVE Final    Comment:  The GeneXpert MRSA Assay (FDA approved for NASAL specimens only), is one component of a comprehensive MRSA colonization surveillance program. It is not intended to diagnose MRSA infection nor to guide or monitor treatment for MRSA infections. Performed at [facility], [place] [address], [place], [place] [address]   Gastrointestinal Panel by PCR , Stool     Status: None   Collection Time: 12/25/19 10:26 AM   Specimen: Stool  Result Value Ref Range Status   Campylobacter species NOT DETECTED NOT DETECTED Final   Plesimonas shigelloides NOT DETECTED NOT DETECTED Final   Salmonella species NOT DETECTED NOT DETECTED Final   Yersinia enterocolitica NOT DETECTED NOT DETECTED Final   Vibrio species NOT DETECTED NOT DETECTED Final   Vibrio cholerae NOT DETECTED NOT DETECTED Final   Enteroaggregative E coli (EAEC) NOT DETECTED NOT DETECTED Final   Enteropathogenic E coli (EPEC) NOT DETECTED NOT DETECTED Final   Enterotoxigenic E coli (ETEC) NOT DETECTED NOT DETECTED Final   Shiga like toxin producing E coli (STEC) NOT DETECTED NOT DETECTED Final   Shigella/Enteroinvasive E coli (EIEC) NOT DETECTED NOT DETECTED Final   Cryptosporidium NOT DETECTED NOT DETECTED Final   Cyclospora cayetanensis NOT DETECTED NOT DETECTED Final   Entamoeba histolytica NOT DETECTED NOT  DETECTED Final   Giardia lamblia NOT DETECTED NOT DETECTED Final   Adenovirus F40/41 NOT DETECTED NOT DETECTED Final   Astrovirus NOT DETECTED NOT DETECTED Final   Norovirus GI/GII NOT DETECTED NOT DETECTED Final   Rotavirus A NOT DETECTED NOT DETECTED Final   Sapovirus (I, II, IV, and V) NOT DETECTED NOT DETECTED Final    Comment: Performed at [facility], [place] [place], [place] [address]  C Difficile Quick Screen w PCR reflex     Status: None   Collection Time: 12/25/19 10:26 AM   Specimen: STOOL  Result Value Ref Range Status   C Diff antigen NEGATIVE NEGATIVE Final   C Diff toxin NEGATIVE NEGATIVE Final   C Diff interpretation No C. difficile detected.  Final    Comment: Performed at [facility], [place] [address], [place], [place] [address]     Time coordinating discharge:  I have spent 35 minutes face to face with the patient and on the ward discussing the patients care, assessment, plan and disposition with other care givers. >50% of the time was devoted counseling the patient about the risks and benefits of treatment/Discharge disposition and coordinating care.   SIGNED:   Damita Lack, MD  [facility] 12/27/2019, 10:43 AM   If 7PM-7AM, please contact night-coverage

## 2019-12-27 NOTE — TOC Transition Note (Signed)
Transition of Care [facility]) - CM/SW Discharge Note   Patient Details  Name: Chloe Harding MRN: [id number] Date of Birth: 08-Dec-1945  Transition of Care [facility] CM/SW Contact:  Trish Mage, LCSW Phone Number: 12/27/2019, 10:29 AM   Clinical Narrative:   Patient to d/c today.  Sending her home with [facility] services through [place] and DME equipment.  Family will pick up. TOC sign off.    Final next level of care: [place] Barriers to Discharge: Barriers Resolved   Patient Goals and CMS Choice        Discharge Placement                       Discharge Plan and Services   Discharge Planning Services: CM Consult                                 Social Determinants of Health (SDOH) Interventions     Readmission Risk Interventions No flowsheet data found.

## 2019-12-28 LAB — CULTURE, BLOOD (ROUTINE X 2)
Culture: NO GROWTH
Culture: NO GROWTH

## 2020-01-08 ENCOUNTER — Telehealth: Payer: Self-pay | Admitting: Internal Medicine

## 2020-01-08 NOTE — Telephone Encounter (Signed)
Phone call placed to patient to offer to schedule a visit with [facility]. Phone rang, with no answer and voicemail box was full. 

## 2020-01-12 ENCOUNTER — Telehealth: Payer: Self-pay | Admitting: Internal Medicine

## 2020-01-12 NOTE — Telephone Encounter (Signed)
Called patient to schedule the Palliative Consult, no answer - left message with reason for call along with my name and contact number. 

## 2020-01-18 ENCOUNTER — Emergency Department (HOSPITAL_COMMUNITY): Payer: Medicare Other

## 2020-01-18 ENCOUNTER — Inpatient Hospital Stay (HOSPITAL_COMMUNITY)
Admission: EM | Admit: 2020-01-18 | Discharge: 2020-01-26 | DRG: 070 | Disposition: A | Discharge status: Skilled Nursing Facility | Payer: Medicare Other | Attending: Internal Medicine | Admitting: Internal Medicine

## 2020-01-18 DIAGNOSIS — G9340 Encephalopathy, unspecified: Secondary | ICD-10-CM | POA: Diagnosis not present

## 2020-01-18 DIAGNOSIS — E11649 Type 2 diabetes mellitus with hypoglycemia without coma: Secondary | ICD-10-CM | POA: Diagnosis not present

## 2020-01-18 DIAGNOSIS — E7800 Pure hypercholesterolemia, unspecified: Secondary | ICD-10-CM | POA: Diagnosis present

## 2020-01-18 DIAGNOSIS — Z79899 Other long term (current) drug therapy: Secondary | ICD-10-CM

## 2020-01-18 DIAGNOSIS — I5042 Chronic combined systolic (congestive) and diastolic (congestive) heart failure: Secondary | ICD-10-CM | POA: Diagnosis present

## 2020-01-18 DIAGNOSIS — Z87440 Personal history of urinary (tract) infections: Secondary | ICD-10-CM

## 2020-01-18 DIAGNOSIS — K5641 Fecal impaction: Secondary | ICD-10-CM | POA: Diagnosis present

## 2020-01-18 DIAGNOSIS — Z79890 Hormone replacement therapy (postmenopausal): Secondary | ICD-10-CM

## 2020-01-18 DIAGNOSIS — G9341 Metabolic encephalopathy: Secondary | ICD-10-CM | POA: Diagnosis not present

## 2020-01-18 DIAGNOSIS — K2210 Ulcer of esophagus without bleeding: Secondary | ICD-10-CM | POA: Diagnosis present

## 2020-01-18 DIAGNOSIS — W19XXXA Unspecified fall, initial encounter: Secondary | ICD-10-CM | POA: Diagnosis present

## 2020-01-18 DIAGNOSIS — Z8719 Personal history of other diseases of the digestive system: Secondary | ICD-10-CM

## 2020-01-18 DIAGNOSIS — E1142 Type 2 diabetes mellitus with diabetic polyneuropathy: Secondary | ICD-10-CM

## 2020-01-18 DIAGNOSIS — Z7984 Long term (current) use of oral hypoglycemic drugs: Secondary | ICD-10-CM

## 2020-01-18 DIAGNOSIS — R296 Repeated falls: Secondary | ICD-10-CM | POA: Diagnosis present

## 2020-01-18 DIAGNOSIS — R1110 Vomiting, unspecified: Secondary | ICD-10-CM | POA: Diagnosis present

## 2020-01-18 DIAGNOSIS — Z888 Allergy status to other drugs, medicaments and biological substances status: Secondary | ICD-10-CM

## 2020-01-18 DIAGNOSIS — K2100 Gastro-esophageal reflux disease with esophagitis, without bleeding: Secondary | ICD-10-CM | POA: Diagnosis present

## 2020-01-18 DIAGNOSIS — D631 Anemia in chronic kidney disease: Secondary | ICD-10-CM | POA: Diagnosis present

## 2020-01-18 DIAGNOSIS — I10 Essential (primary) hypertension: Secondary | ICD-10-CM | POA: Diagnosis present

## 2020-01-18 DIAGNOSIS — Z9049 Acquired absence of other specified parts of digestive tract: Secondary | ICD-10-CM

## 2020-01-18 DIAGNOSIS — N179 Acute kidney failure, unspecified: Secondary | ICD-10-CM

## 2020-01-18 DIAGNOSIS — E039 Hypothyroidism, unspecified: Secondary | ICD-10-CM | POA: Diagnosis present

## 2020-01-18 DIAGNOSIS — E860 Dehydration: Secondary | ICD-10-CM | POA: Diagnosis present

## 2020-01-18 DIAGNOSIS — N1831 Chronic kidney disease, stage 3a: Secondary | ICD-10-CM | POA: Diagnosis present

## 2020-01-18 DIAGNOSIS — Z8249 Family history of ischemic heart disease and other diseases of the circulatory system: Secondary | ICD-10-CM

## 2020-01-18 DIAGNOSIS — E559 Vitamin D deficiency, unspecified: Secondary | ICD-10-CM | POA: Diagnosis present

## 2020-01-18 DIAGNOSIS — E876 Hypokalemia: Secondary | ICD-10-CM | POA: Diagnosis not present

## 2020-01-18 DIAGNOSIS — E1165 Type 2 diabetes mellitus with hyperglycemia: Secondary | ICD-10-CM | POA: Diagnosis present

## 2020-01-18 DIAGNOSIS — R54 Age-related physical debility: Secondary | ICD-10-CM | POA: Diagnosis present

## 2020-01-18 DIAGNOSIS — K449 Diaphragmatic hernia without obstruction or gangrene: Secondary | ICD-10-CM | POA: Diagnosis present

## 2020-01-18 DIAGNOSIS — Z20822 Contact with and (suspected) exposure to covid-19: Secondary | ICD-10-CM | POA: Diagnosis present

## 2020-01-18 DIAGNOSIS — K222 Esophageal obstruction: Secondary | ICD-10-CM | POA: Diagnosis present

## 2020-01-18 DIAGNOSIS — I2510 Atherosclerotic heart disease of native coronary artery without angina pectoris: Secondary | ICD-10-CM | POA: Diagnosis present

## 2020-01-18 DIAGNOSIS — E785 Hyperlipidemia, unspecified: Secondary | ICD-10-CM | POA: Diagnosis present

## 2020-01-18 DIAGNOSIS — K5730 Diverticulosis of large intestine without perforation or abscess without bleeding: Secondary | ICD-10-CM | POA: Diagnosis present

## 2020-01-18 DIAGNOSIS — D509 Iron deficiency anemia, unspecified: Secondary | ICD-10-CM | POA: Diagnosis present

## 2020-01-18 DIAGNOSIS — F329 Major depressive disorder, single episode, unspecified: Secondary | ICD-10-CM | POA: Diagnosis present

## 2020-01-18 DIAGNOSIS — R4182 Altered mental status, unspecified: Secondary | ICD-10-CM

## 2020-01-18 DIAGNOSIS — E871 Hypo-osmolality and hyponatremia: Secondary | ICD-10-CM | POA: Diagnosis present

## 2020-01-18 DIAGNOSIS — E1122 Type 2 diabetes mellitus with diabetic chronic kidney disease: Secondary | ICD-10-CM | POA: Diagnosis present

## 2020-01-18 DIAGNOSIS — I130 Hypertensive heart and chronic kidney disease with heart failure and stage 1 through stage 4 chronic kidney disease, or unspecified chronic kidney disease: Secondary | ICD-10-CM | POA: Diagnosis present

## 2020-01-18 DIAGNOSIS — S065X9A Traumatic subdural hemorrhage with loss of consciousness of unspecified duration, initial encounter: Secondary | ICD-10-CM | POA: Diagnosis present

## 2020-01-18 DIAGNOSIS — R195 Other fecal abnormalities: Secondary | ICD-10-CM | POA: Diagnosis present

## 2020-01-18 DIAGNOSIS — Z9181 History of falling: Secondary | ICD-10-CM

## 2020-01-18 DIAGNOSIS — I4581 Long QT syndrome: Secondary | ICD-10-CM | POA: Diagnosis present

## 2020-01-18 LAB — BASIC METABOLIC PANEL
Anion gap: 6 (ref 5–15)
BUN: 14 mg/dL (ref 8–23)
CO2: 23 mmol/L (ref 22–32)
Calcium: 10.4 mg/dL — ABNORMAL HIGH (ref 8.9–10.3)
Chloride: 100 mmol/L (ref 98–111)
Creatinine, Ser: 1.31 mg/dL — ABNORMAL HIGH (ref 0.44–1.00)
GFR calc Af Amer: 47 mL/min — ABNORMAL LOW (ref >60)
GFR calc non Af Amer: 41 mL/min — ABNORMAL LOW (ref >60)
Glucose, Bld: 102 mg/dL — ABNORMAL HIGH (ref 70–99)
Potassium: 4.2 mmol/L (ref 3.5–5.1)
Sodium: 129 mmol/L — ABNORMAL LOW (ref 135–145)

## 2020-01-18 LAB — DIFFERENTIAL
Abs Immature Granulocytes: 0.73 10*3/uL — ABNORMAL HIGH (ref 0.00–0.07)
Basophils Absolute: 0.3 10*3/uL — ABNORMAL HIGH (ref 0.0–0.1)
Basophils Relative: 5 %
Eosinophils Absolute: 0.7 10*3/uL — ABNORMAL HIGH (ref 0.0–0.5)
Eosinophils Relative: 11 %
Immature Granulocytes: 11 %
Lymphocytes Relative: 27 %
Lymphs Abs: 1.8 10*3/uL (ref 0.7–4.0)
Monocytes Absolute: 1 10*3/uL (ref 0.1–1.0)
Monocytes Relative: 14 %
Neutro Abs: 2.9 10*3/uL (ref 1.7–7.7)
Neutrophils Relative %: 43 %
nRBC: 0 /100 WBC

## 2020-01-18 LAB — LACTIC ACID, PLASMA: Lactic Acid, Venous: 0.7 mmol/L (ref 0.5–1.9)

## 2020-01-18 LAB — CBC
HCT: 37.4 % (ref 36.0–46.0)
Hemoglobin: 12.4 g/dL (ref 12.0–15.0)
MCH: 28 pg (ref 26.0–34.0)
MCHC: 33.2 g/dL (ref 30.0–36.0)
MCV: 84.4 fL (ref 80.0–100.0)
Platelets: 240 10*3/uL (ref 150–400)
RBC: 4.43 MIL/uL (ref 3.87–5.11)
RDW: 16.2 % — ABNORMAL HIGH (ref 11.5–15.5)
WBC: 6.7 10*3/uL (ref 4.0–10.5)
nRBC: 6.1 % — ABNORMAL HIGH (ref 0.0–0.2)

## 2020-01-18 LAB — BRAIN NATRIURETIC PEPTIDE: B Natriuretic Peptide: 439.5 pg/mL — ABNORMAL HIGH (ref 0.0–100.0)

## 2020-01-18 MED ORDER — SODIUM CHLORIDE 0.9 % IV BOLUS
500.0000 mL | Freq: Once | INTRAVENOUS | Status: AC
  Administered 2020-01-18: 500 mL via INTRAVENOUS

## 2020-01-18 MED ORDER — SODIUM CHLORIDE 0.9 % IV SOLN: INTRAVENOUS | Status: DC

## 2020-01-18 NOTE — ED Triage Notes (Signed)
Patient present with AMS. She was last seen normal 1 day ago. According to family she did have a fall on Monday. She is currently on blood thinners. EMS says she is warm to the touch and they found her with dried emesis on her mouth. EMS administered a 500 cc bolus of fluid.  EMS vitals: 26 RR 26-30 CAP 166/80 BP 90 HR 115 CBG 96 TEMP

## 2020-01-18 NOTE — ED Notes (Signed)
Pt came in with urine and stool covered through pants. This Probation officer and Levada Dy, RN cleaned up patient and got patient comfortable in bed.

## 2020-01-18 NOTE — ED Provider Notes (Addendum)
[facility] DEPT Provider Note   CSN: [id number] Arrival date & time: 01/18/20  1943     History Chief Complaint  Patient presents with  . Urinary Tract Infection  . Altered Mental Status    Chloe Harding is a 74 y.o. female.  Patient brought in by EMS.  I did discuss situation with her husband that was in the room.  Patient seem to be last normal about a day ago.  Brought her in for altered mental status.  He said that she had multiple falls yesterday.  She is on blood thinners.  I felt that she was warm to touch found some dried emesis in her mouth.  Husband also said that she has not been eating or drinking anything for couple days.  EMS gave her 500cc bolus of fluid.  Past medical history significant for diabetes and depression.  As well as hypertension high cholesterol.  Medication list does not list a specific blood thinner.  Patient seen August 7 for fall and head injury.  Patient was admitted August 7 through August 11 for very similar presentation.  Patient however was febrile at that time.  Had hyponatremia.  Dehydration and acute kidney injury.  That admission list has past medical problems as the type 2 diabetes hypertension hyperlipidemia coronary artery disease congestive heart failure and stage III chronic kidney disease.        Past Medical History:  Diagnosis Date  . Arthritis   . Depression   . Diabetes mellitus   . Hiatal hernia   . Hypercholesteremia   . Hypertension   . Reflux   . Renal disorder    shutting down 4 years ago    Patient Active Problem List   Diagnosis Date Noted  . Diarrhea 12/23/2019  . SOB (shortness of breath)   . Demand ischemia ([place])   . CAD in native artery   . Pneumonia 11/05/2019  . Hyponatremia 11/05/2019  . Acute CHF (congestive heart failure) ([place]) 11/05/2019  . Esophageal stricture   . Dysphagia 01/15/2019  . Symptomatic anemia 05/18/2017  . Acute-on-chronic kidney injury ([place]) 05/18/2017    . Hypertension 05/18/2017  . Hiatal hernia 05/18/2017  . History of esophageal stricture 05/18/2017  . Pure hypercholesterolemia 05/18/2017  . Diabetes mellitus 05/18/2017  . Solitary thyroid nodule 05/18/2017  . Hypotension 03/30/2015  . Sepsis ([place]) 03/30/2015  . CAP (community acquired pneumonia) 03/30/2015  . Hyperkalemia 03/30/2015  . Mild renal insufficiency 03/30/2015  . Nausea with vomiting 03/30/2015  . Diabetic neuropathy ([place]) 03/30/2015  . OBESITY 03/11/2010  . Type 2 diabetes mellitus ([place]) 12/26/2009  . Dyslipidemia 12/26/2009  . ANEMIA 12/26/2009  . DEPRESSION 12/26/2009  . HYPERTENSION, BENIGN ESSENTIAL 12/26/2009  . GERD 12/26/2009  . HIATAL HERNIA 12/26/2009  . CHRONIC KIDNEY DISEASE STAGE I 12/26/2009  . UTI 12/26/2009  . ARTHRITIS 12/26/2009  . ARTHROSCOPY, RIGHT KNEE, HX OF 12/26/2009    Past Surgical History:  Procedure Laterality Date  . CHOLECYSTECTOMY    . COLONOSCOPY WITH PROPOFOL N/A 05/20/2017   Procedure: COLONOSCOPY WITH PROPOFOL;  Surgeon: Carol Ada, MD;  Location: [facility];  Service: Endoscopy;  Laterality: N/A;  . ESOPHAGOGASTRODUODENOSCOPY N/A 05/19/2017   Procedure: ESOPHAGOGASTRODUODENOSCOPY (EGD);  Surgeon: Juanita Craver, MD;  Location[facility] ENDOSCOPY;  Service: Endoscopy;  Laterality: N/A;  . ESOPHAGOGASTRODUODENOSCOPY (EGD) WITH PROPOFOL N/A 01/17/2019   Procedure: ESOPHAGOGASTRODUODENOSCOPY (EGD) WITH PROPOFOL;  Surgeon: Carol Ada, MD;  Location: [facility] ENDOSCOPY;  Service: Endoscopy;  Laterality: N/A;  .  SAVORY DILATION N/A 01/17/2019   Procedure: SAVORY DILATION;  Surgeon: Carol Ada, MD;  Location: [facility] ENDOSCOPY;  Service: Endoscopy;  Laterality: N/A;     OB History   No obstetric history on file.     Family History  Problem Relation Age of Onset  . Heart attack Sister   . Heart failure Sister   . Heart attack Brother   . Heart failure Brother     Social History   Tobacco Use  . Smoking status: Never Smoker  .  Smokeless tobacco: Never Used  Vaping Use  . Vaping Use: Never used  Substance Use Topics  . Alcohol use: No  . Drug use: No    Home Medications Prior to Admission medications   Medication Sig Start Date End Date Taking? Authorizing Provider  HYDROcodone-acetaminophen (NORCO/VICODIN) 5-325 MG tablet Take 1 tablet by mouth every 6 (six) hours as needed for moderate pain. 12/27/19  Yes Amin, Jeanella Flattery, MD  metoprolol tartrate (LOPRESSOR) 25 MG tablet Take 1 tablet (25 mg total) by mouth 2 (two) times daily. 11/09/19  Yes Amin, Jeanella Flattery, MD  amitriptyline (ELAVIL) 25 MG tablet Take 25 mg by mouth at bedtime.    [provider]  atorvastatin (LIPITOR) 20 MG tablet Take 1 tablet (20 mg total) by mouth daily. 11/10/19   Amin, Jeanella Flattery, MD  bisacodyl (DULCOLAX) 5 MG EC tablet Take 1 tablet (5 mg total) by mouth daily as needed for moderate constipation. 11/09/19   Amin, Jeanella Flattery, MD  CONSTULOSE 10 GM/15ML solution Take 10 g by mouth daily as needed for mild constipation.  10/25/19   [provider]  cyclobenzaprine (FLEXERIL) 10 MG tablet Take 10 mg by mouth 3 (three) times daily. 11/27/19   [provider]  escitalopram (LEXAPRO) 10 MG tablet Take 10 mg by mouth daily. 11/18/19   [provider]  ferrous sulfate 325 (65 FE) MG tablet Take 1 tablet (325 mg total) by mouth 2 (two) times daily with a meal. 11/09/19   Amin, Jeanella Flattery, MD  folic acid (FOLVITE) 1 MG tablet Take 1 mg by mouth daily.    [provider]  furosemide (LASIX) 40 MG tablet Take 1 tablet (40 mg total) by mouth daily. 11/10/19   Amin, Jeanella Flattery, MD  gabapentin (NEURONTIN) 400 MG capsule Take 400 mg by mouth 3 (three) times daily. 10/03/19   [provider]  hydroxychloroquine (PLAQUENIL) 200 MG tablet Take 200 mg by mouth daily. 10/04/19   [provider]  JANUVIA 100 MG tablet Take 100 mg by mouth daily. 10/02/19   [provider]  pantoprazole  (PROTONIX) 40 MG tablet Take 1 tablet (40 mg total) by mouth 2 (two) times daily. 01/18/19   Mariel Aloe, MD  polyethylene glycol (MIRALAX / GLYCOLAX) 17 g packet Take 17 g by mouth daily as needed for moderate constipation or severe constipation. 11/09/19   Amin, Jeanella Flattery, MD  potassium chloride (KLOR-CON) 10 MEQ tablet Take 10 mEq by mouth daily. 10/25/19   [provider]  ropinirole (REQUIP) 5 MG tablet Take 5 mg by mouth at bedtime. 10/02/19   [provider]  senna-docusate (SENOKOT-S) 8.6-50 MG tablet Take 1 tablet by mouth at bedtime. 11/09/19   Amin, Jeanella Flattery, MD  Vitamin D3 (VITAMIN D) 25 MCG tablet Take 1 tablet (1,000 Units total) by mouth daily. 12/27/19   Damita Lack, MD    Allergies    Amlodipine besylate  Review of Systems  Review of Systems  Unable to perform ROS: Mental status change    Physical Exam Updated Vital Signs BP (!) 147/80 (BP Location: Right Arm)   Pulse 91   Temp 99.4 F (37.4 C) (Oral)   Resp (!) 21   SpO2 95%   Physical Exam Vitals and nursing note reviewed.  Constitutional:      General: She is not in acute distress.    Appearance: Normal appearance. She is well-developed.  HENT:     Head: Normocephalic.     Comments: Contusion to the right forehead area.    Mouth/Throat:     Mouth: Mucous membranes are dry.  Eyes:     Extraocular Movements: Extraocular movements intact.     Conjunctiva/sclera: Conjunctivae normal.     Pupils: Pupils are equal, round, and reactive to light.  Cardiovascular:     Rate and Rhythm: Normal rate and regular rhythm.     Heart sounds: No murmur heard.   Pulmonary:     Effort: Pulmonary effort is normal. No respiratory distress.     Breath sounds: Normal breath sounds.  Abdominal:     Palpations: Abdomen is soft.     Tenderness: There is no abdominal tenderness.  Musculoskeletal:        General: No swelling. Normal range of motion.     Cervical back: Normal range of motion and  neck supple.  Skin:    General: Skin is warm and dry.     Capillary Refill: Capillary refill takes less than 2 seconds.  Neurological:     General: No focal deficit present.     Mental Status: She is alert.     Motor: No weakness.     Comments: Patient awake.  Moving all 4 extremities.  Will follow commands by moving legs and arms.  Verbalizes.  But seems very drowsy.     ED Results / Procedures / Treatments   Labs (all labs ordered are listed, but only abnormal results are displayed) Labs Reviewed  BASIC METABOLIC PANEL - Abnormal; Notable for the following components:      Result Value   Sodium 129 (*)    Glucose, Bld 102 (*)    Creatinine, Ser 1.31 (*)    Calcium 10.4 (*)    GFR calc non Af Amer 41 (*)    GFR calc Af Amer 47 (*)    All other components within normal limits  CBC - Abnormal; Notable for the following components:   RDW 16.2 (*)    nRBC 6.1 (*)    All other components within normal limits  DIFFERENTIAL - Abnormal; Notable for the following components:   Eosinophils Absolute 0.7 (*)    Basophils Absolute 0.3 (*)    Abs Immature Granulocytes 0.73 (*)    All other components within normal limits  CULTURE, BLOOD (ROUTINE X 2)  CULTURE, BLOOD (ROUTINE X 2)  URINE CULTURE  SARS CORONAVIRUS 2 BY RT PCR (HOSPITAL ORDER, [facility])  LACTIC ACID, PLASMA  URINALYSIS, ROUTINE W REFLEX MICROSCOPIC    EKG None  Radiology CT Head Wo Contrast  Result Date: 01/18/2020 CLINICAL DATA:  Mental status change EXAM: CT HEAD WITHOUT CONTRAST TECHNIQUE: Contiguous axial images were obtained from the base of the skull through the vertex without intravenous contrast. COMPARISON:  None. FINDINGS: Brain: No evidence of acute territorial infarction, hemorrhage, hydrocephalus,extra-axial collection or mass lesion/mass effect. There is low-attenuation changes in the deep white matter consistent with small vessel ischemia. Ventricles  are normal in size and  contour. Vascular: No hyperdense vessel or unexpected calcification. Skull: The skull is intact. No fracture or focal lesion identified. Sinuses/Orbits: Small amount of fluid seen within the left maxillary sinus and right sphenoid sinus. The orbits and globes intact. Other: None IMPRESSION: No acute intracranial abnormality. Findings consistent chronic small vessel ischemia Electronically Signed   By: Prudencio Pair M.D.   On: 01/18/2020 22:13   DG Chest Port 1 View  Result Date: 01/18/2020 CLINICAL DATA:  Altered mental status, status post fall. EXAM: PORTABLE CHEST 1 VIEW COMPARISON:  December 23, 2019 FINDINGS: Decreased lung volumes are seen which is likely, in part, secondary to the degree of patient inspiration. There is no evidence of acute infiltrate, pleural effusion or pneumothorax. Numerous radiopaque overlying cardiac lead wires are present. The heart size and mediastinal contours are within normal limits. There is marked severity calcification of the aortic arch. No acute osseous abnormalities are identified. IMPRESSION: Low lung volumes without evidence of acute or active cardiopulmonary disease. Electronically Signed   By: Virgina Norfolk M.D.   On: 01/18/2020 22:18   DG HIPS BILAT WITH PELVIS 3-4 VIEWS  Result Date: 01/18/2020 CLINICAL DATA:  Status post fall. EXAM: DG HIP (WITH OR WITHOUT PELVIS) 3-4V BILAT COMPARISON:  None. FINDINGS: There is no evidence of hip fracture or dislocation. There is no evidence of arthropathy or other focal bone abnormality. IMPRESSION: Negative. Electronically Signed   By: Virgina Norfolk M.D.   On: 01/18/2020 22:16    Procedures Procedures (including critical care time)  Medications Ordered in ED Medications  0.9 %  sodium chloride infusion ( Intravenous New Bag/Given 01/18/20 2146)  sodium chloride 0.9 % bolus 500 mL (0 mLs Intravenous Stopped 01/18/20 2209)    ED Course  I have reviewed the triage vital signs and the nursing notes.  Pertinent labs &  imaging results that were available during my care of the patient were reviewed by me and considered in my medical decision making (see chart for details).    MDM Rules/Calculators/A&P                         Regarding the multiple falls.  Head CT without any acute findings.  Chest x-ray without acute findings.  X-rays of both hips and pelvis without any acute findings.  In addition chest x-ray without evidence of pneumonia or pulmonary edema.  Labs do show some hyponatremia with sodium of 129.  No leukocytosis.  No significant anemia.  Creatinine is elevated to 1.31.  And renal function is decreased from her last discharge.  So clinically appeared very dehydrated.  Fluids ordered here.  Urinalysis still pending.  Covid testing pending.  Feel that patient will require admission.  Will add on ammonia level although no prior history of concerns for that.  And will add on BNP.  We will discuss with hospitalist.   Family was somewhat concerned about urinary tract infection is a possibility.  Patient was hydrated with fluids gently.  Patient's lactic acid not elevated.  Clinically not concerned about meningitis.  Presentation seems very similar to her admission on August 7.  Patient appears dehydrated.  Renal function is worse again and has some hyponatremia that is worse when she was admitted before.  Discussed with hospitalist they will admit.  Will concerned about urinary tract infection urinalysis results are still pending.  Final Clinical Impression(s) / ED Diagnoses Final diagnoses:  Altered mental status, unspecified altered  mental status type  Fall, initial encounter  AKI (acute kidney injury) ([place])  Dehydration  Hyponatremia    Rx / DC Orders ED Discharge Orders    None       Fredia Sorrow, MD 01/18/20 [id number]    Fredia Sorrow, MD 01/18/20 2563    Fredia Sorrow, MD 01/18/20 2320

## 2020-01-19 ENCOUNTER — Telehealth: Payer: Self-pay | Admitting: Nurse Practitioner

## 2020-01-19 ENCOUNTER — Other Ambulatory Visit: Payer: Self-pay | Admitting: Nurse Practitioner

## 2020-01-19 ENCOUNTER — Observation Stay (HOSPITAL_COMMUNITY): Payer: Medicare Other

## 2020-01-19 ENCOUNTER — Encounter (HOSPITAL_COMMUNITY): Payer: Self-pay | Admitting: Internal Medicine

## 2020-01-19 DIAGNOSIS — N179 Acute kidney failure, unspecified: Secondary | ICD-10-CM | POA: Diagnosis not present

## 2020-01-19 LAB — CBC
HCT: 29.6 % — ABNORMAL LOW (ref 36.0–46.0)
Hemoglobin: 9.6 g/dL — ABNORMAL LOW (ref 12.0–15.0)
MCH: 28.3 pg (ref 26.0–34.0)
MCHC: 32.4 g/dL (ref 30.0–36.0)
MCV: 87.3 fL (ref 80.0–100.0)
Platelets: 184 10*3/uL (ref 150–400)
RBC: 3.39 MIL/uL — ABNORMAL LOW (ref 3.87–5.11)
RDW: 16.4 % — ABNORMAL HIGH (ref 11.5–15.5)
WBC: 8.5 10*3/uL (ref 4.0–10.5)
nRBC: 0 % (ref 0.0–0.2)

## 2020-01-19 LAB — GLUCOSE, CAPILLARY
Glucose-Capillary: 84 mg/dL (ref 70–99)
Glucose-Capillary: 77 mg/dL (ref 70–99)
Glucose-Capillary: 76 mg/dL (ref 70–99)
Glucose-Capillary: 94 mg/dL (ref 70–99)

## 2020-01-19 LAB — BASIC METABOLIC PANEL
Anion gap: 9 (ref 5–15)
BUN: 16 mg/dL (ref 8–23)
CO2: 23 mmol/L (ref 22–32)
Calcium: 10.2 mg/dL (ref 8.9–10.3)
Chloride: 102 mmol/L (ref 98–111)
Creatinine, Ser: 1.08 mg/dL — ABNORMAL HIGH (ref 0.44–1.00)
GFR calc Af Amer: 59 mL/min — ABNORMAL LOW (ref >60)
GFR calc non Af Amer: 51 mL/min — ABNORMAL LOW (ref >60)
Glucose, Bld: 97 mg/dL (ref 70–99)
Potassium: 4.6 mmol/L (ref 3.5–5.1)
Sodium: 134 mmol/L — ABNORMAL LOW (ref 135–145)

## 2020-01-19 LAB — SARS CORONAVIRUS 2 BY RT PCR (HOSPITAL ORDER, PERFORMED IN ~~LOC~~ HOSPITAL LAB): SARS Coronavirus 2: NEGATIVE

## 2020-01-19 LAB — URINALYSIS, ROUTINE W REFLEX MICROSCOPIC
Bilirubin Urine: NEGATIVE
Glucose, UA: NEGATIVE mg/dL
Hgb urine dipstick: NEGATIVE
Ketones, ur: NEGATIVE mg/dL
Leukocytes,Ua: NEGATIVE
Nitrite: NEGATIVE
Protein, ur: NEGATIVE mg/dL
Specific Gravity, Urine: 1.01 (ref 1.005–1.030)
pH: 6 (ref 5.0–8.0)

## 2020-01-19 LAB — TSH: TSH: 0.702 u[IU]/mL (ref 0.350–4.500)

## 2020-01-19 LAB — VITAMIN B12: Vitamin B-12: 956 pg/mL — ABNORMAL HIGH (ref 180–914)

## 2020-01-19 LAB — OCCULT BLOOD X 1 CARD TO LAB, STOOL: Fecal Occult Bld: POSITIVE — AB

## 2020-01-19 LAB — AMMONIA
Ammonia: <9 umol/L — ABNORMAL LOW (ref 9–35)
Ammonia: 22 umol/L (ref 9–35)

## 2020-01-19 MED ORDER — HYDRALAZINE HCL 20 MG/ML IJ SOLN: 5.0000 mg | Freq: Three times a day (TID) | INTRAMUSCULAR | Status: DC | PRN

## 2020-01-19 MED ORDER — ROPINIROLE HCL 1 MG PO TABS
5.0000 mg | ORAL_TABLET | Freq: Every day | ORAL | Status: DC
  Administered 2020-01-19 – 2020-01-25 (×7): 5 mg via ORAL
  Filled 2020-01-19 (×5): qty 5
  Filled 2020-01-19: qty 10
  Filled 2020-01-19 (×4): qty 5

## 2020-01-19 MED ORDER — PANTOPRAZOLE SODIUM 40 MG PO TBEC: 40.0000 mg | DELAYED_RELEASE_TABLET | Freq: Two times a day (BID) | ORAL | Status: DC

## 2020-01-19 MED ORDER — METOPROLOL TARTRATE 25 MG PO TABS
25.0000 mg | ORAL_TABLET | Freq: Two times a day (BID) | ORAL | Status: DC
  Administered 2020-01-20 – 2020-01-26 (×12): 25 mg via ORAL
  Filled 2020-01-19 (×13): qty 1

## 2020-01-19 MED ORDER — ATORVASTATIN CALCIUM 20 MG PO TABS
20.0000 mg | ORAL_TABLET | Freq: Every day | ORAL | Status: DC
  Administered 2020-01-21 – 2020-01-26 (×6): 20 mg via ORAL
  Filled 2020-01-19 (×6): qty 1

## 2020-01-19 MED ORDER — BISACODYL 10 MG RE SUPP
10.0000 mg | Freq: Every day | RECTAL | Status: DC | PRN
  Administered 2020-01-19: 10 mg via RECTAL
  Filled 2020-01-19: qty 1

## 2020-01-19 MED ORDER — ESCITALOPRAM OXALATE 10 MG PO TABS
10.0000 mg | ORAL_TABLET | Freq: Every day | ORAL | Status: DC
  Administered 2020-01-21 – 2020-01-26 (×6): 10 mg via ORAL
  Filled 2020-01-19 (×6): qty 1

## 2020-01-19 MED ORDER — INSULIN ASPART 100 UNIT/ML ~~LOC~~ SOLN: 0.0000 [IU] | Freq: Three times a day (TID) | SUBCUTANEOUS | Status: DC

## 2020-01-19 MED ORDER — DEXTROSE-NACL 5-0.9 % IV SOLN: INTRAVENOUS | Status: DC

## 2020-01-19 MED ORDER — ONDANSETRON HCL 4 MG PO TABS: 4.0000 mg | ORAL_TABLET | Freq: Four times a day (QID) | ORAL | Status: DC | PRN

## 2020-01-19 MED ORDER — FERROUS SULFATE 325 (65 FE) MG PO TABS
325.0000 mg | ORAL_TABLET | Freq: Two times a day (BID) | ORAL | Status: DC
  Administered 2020-01-21 – 2020-01-26 (×10): 325 mg via ORAL
  Filled 2020-01-19 (×11): qty 1

## 2020-01-19 MED ORDER — ONDANSETRON HCL 4 MG/2ML IJ SOLN: 4.0000 mg | Freq: Four times a day (QID) | INTRAMUSCULAR | Status: DC | PRN

## 2020-01-19 MED ORDER — HYDROXYCHLOROQUINE SULFATE 200 MG PO TABS: 200.0000 mg | ORAL_TABLET | Freq: Every day | ORAL | Status: DC

## 2020-01-19 MED ORDER — FOLIC ACID 1 MG PO TABS
1.0000 mg | ORAL_TABLET | Freq: Every day | ORAL | Status: DC
  Administered 2020-01-21 – 2020-01-26 (×6): 1 mg via ORAL
  Filled 2020-01-19 (×6): qty 1

## 2020-01-19 NOTE — H&P (Signed)
History and Physical    Chloe Harding XBJ:[id number] DOB: 1945/06/12 DOA: 01/18/2020  PCP: [name], NP  Patient coming from: Home.  History is obtained from the ER physician who discussed with patient's husband.  Previous charts.  Patient is confused.  Chief Complaint: Confusion.  HPI: Chloe Harding is a 75 y.o. female with history of diabetes mellitus, CHF, chronic anemia, hyperlipidemia, hypertension was admitted last month first week for confusion and possible sepsis prior to that patient also was admitted for GI bleed work-up was negative was found to be confused over the last 24 hours and had multiple falls.  EMS on arrival found patient was mildly warm and was given 5 cc bolus.  Patient was brought to the ER.  ED Course: In the ER patient is only oriented to her name.  CT head is unremarkable chest x-ray did not show anything acute patient was not febrile.  EKG shows prolonged QTC otherwise normal sinus rhythm creatinine last month was 1.7 at the time of discharge is around 1.3 now sodium 129.  UA still pending.  Patient was started on fluids admitted for acute encephalopathy in the setting of acute renal failure.  Review of Systems: As per HPI, rest all negative.   Past Medical History:  Diagnosis Date  . Arthritis   . Depression   . Diabetes mellitus   . Hiatal hernia   . Hypercholesteremia   . Hypertension   . Reflux   . Renal disorder    shutting down 4 years ago    Past Surgical History:  Procedure Laterality Date  . CHOLECYSTECTOMY    . COLONOSCOPY WITH PROPOFOL N/A 05/20/2017   Procedure: COLONOSCOPY WITH PROPOFOL;  Surgeon: Carol Ada, MD;  Location: [facility];  Service: Endoscopy;  Laterality: N/A;  . ESOPHAGOGASTRODUODENOSCOPY N/A 05/19/2017   Procedure: ESOPHAGOGASTRODUODENOSCOPY (EGD);  Surgeon: Juanita Craver, MD;  Location: [facility] ENDOSCOPY;  Service: Endoscopy;  Laterality: N/A;  . ESOPHAGOGASTRODUODENOSCOPY (EGD) WITH PROPOFOL N/A 01/17/2019    Procedure: ESOPHAGOGASTRODUODENOSCOPY (EGD) WITH PROPOFOL;  Surgeon: Carol Ada, MD;  Location: [facility] ENDOSCOPY;  Service: Endoscopy;  Laterality: N/A;  . SAVORY DILATION N/A 01/17/2019   Procedure: SAVORY DILATION;  Surgeon: Carol Ada, MD;  Location: [facility] ENDOSCOPY;  Service: Endoscopy;  Laterality: N/A;     reports that she has never smoked. She has never used smokeless tobacco. She reports that she does not drink alcohol and does not use drugs.  Allergies  Allergen Reactions  . Amlodipine Besylate Swelling    swelling    Family History  Problem Relation Age of Onset  . Heart attack Sister   . Heart failure Sister   . Heart attack Brother   . Heart failure Brother     Prior to Admission medications   Medication Sig Start Date End Date Taking? Authorizing Provider  HYDROcodone-acetaminophen (NORCO/VICODIN) 5-325 MG tablet Take 1 tablet by mouth every 6 (six) hours as needed for moderate pain. 12/27/19  Yes Amin, Jeanella Flattery, MD  metoprolol tartrate (LOPRESSOR) 25 MG tablet Take 1 tablet (25 mg total) by mouth 2 (two) times daily. 11/09/19  Yes Amin, Jeanella Flattery, MD  amitriptyline (ELAVIL) 25 MG tablet Take 25 mg by mouth at bedtime.    [provider]  atorvastatin (LIPITOR) 20 MG tablet Take 1 tablet (20 mg total) by mouth daily. 11/10/19   Amin, Jeanella Flattery, MD  bisacodyl (DULCOLAX) 5 MG EC tablet Take 1 tablet (5 mg total) by mouth daily as needed for moderate constipation.  11/09/19   Amin, Jeanella Flattery, MD  CONSTULOSE 10 GM/15ML solution Take 10 g by mouth daily as needed for mild constipation.  10/25/19   [provider]  cyclobenzaprine (FLEXERIL) 10 MG tablet Take 10 mg by mouth 3 (three) times daily. 11/27/19   [provider]  escitalopram (LEXAPRO) 10 MG tablet Take 10 mg by mouth daily. 11/18/19   [provider]  ferrous sulfate 325 (65 FE) MG tablet Take 1 tablet (325 mg total) by mouth 2 (two) times daily with a meal. 11/09/19   Amin, Jeanella Flattery, MD  folic acid (FOLVITE) 1 MG tablet Take 1 mg by mouth daily.    [provider]  furosemide (LASIX) 40 MG tablet Take 1 tablet (40 mg total) by mouth daily. 11/10/19   Amin, Jeanella Flattery, MD  gabapentin (NEURONTIN) 400 MG capsule Take 400 mg by mouth 3 (three) times daily. 10/03/19   [provider]  hydroxychloroquine (PLAQUENIL) 200 MG tablet Take 200 mg by mouth daily. 10/04/19   [provider]  JANUVIA 100 MG tablet Take 100 mg by mouth daily. 10/02/19   [provider]  pantoprazole (PROTONIX) 40 MG tablet Take 1 tablet (40 mg total) by mouth 2 (two) times daily. 01/18/19   Mariel Aloe, MD  polyethylene glycol (MIRALAX / GLYCOLAX) 17 g packet Take 17 g by mouth daily as needed for moderate constipation or severe constipation. 11/09/19   Amin, Jeanella Flattery, MD  potassium chloride (KLOR-CON) 10 MEQ tablet Take 10 mEq by mouth daily. 10/25/19   [provider]  ropinirole (REQUIP) 5 MG tablet Take 5 mg by mouth at bedtime. 10/02/19   [provider]  senna-docusate (SENOKOT-S) 8.6-50 MG tablet Take 1 tablet by mouth at bedtime. 11/09/19   Amin, Jeanella Flattery, MD  Vitamin D3 (VITAMIN D) 25 MCG tablet Take 1 tablet (1,000 Units total) by mouth daily. 12/27/19   Damita Lack, MD    Physical Exam: Constitutional: Moderately built and nourished. Vitals:   01/18/20 2026 01/18/20 2330 01/19/20 0000 01/19/20 0053  BP:  (!) 136/56 (!) 146/62 (!) 139/92  Pulse:  84 85 87  Resp:  14 20 16   Temp:    98.6 F (37 C)  TempSrc:      SpO2: 95% 97% 97% 92%   Eyes: Anicteric no pallor. ENMT: No discharge from the ears eyes nose or mouth. Neck: No neck rigidity no mass felt. Respiratory: No rhonchi or crepitations. Cardiovascular: S1-S2 heard. Abdomen: Soft nontender bowel sounds present. Musculoskeletal: No edema. Skin: No rash. Neurologic: Patient is alert awake but confused oriented to name only.  Moving all extremities. Psychiatric:  Appears confused.   Labs on Admission: I have personally reviewed following labs and imaging studies  CBC: Recent Labs  Lab 01/18/20 2032  WBC 6.7  NEUTROABS 2.9  HGB 12.4  HCT 37.4  MCV 84.4  PLT 888   Basic Metabolic Panel: Recent Labs  Lab 01/18/20 2032  NA 129*  K 4.2  CL 100  CO2 23  GLUCOSE 102*  BUN 14  CREATININE 1.31*  CALCIUM 10.4*   GFR: CrCl cannot be calculated (Unknown ideal weight.). Liver Function Tests: No results for input(s): AST, ALT, ALKPHOS, BILITOT, PROT, ALBUMIN in the last 168 hours. No results for input(s): LIPASE, AMYLASE in the last 168 hours. Recent Labs  Lab 01/18/20 2306  AMMONIA <9*   Coagulation Profile: No results for input(s): INR, PROTIME in the last 168 hours. Cardiac Enzymes: No  results for input(s): CKTOTAL, CKMB, CKMBINDEX, TROPONINI in the last 168 hours. BNP (last 3 results) No results for input(s): PROBNP in the last 8760 hours. HbA1C: No results for input(s): HGBA1C in the last 72 hours. CBG: No results for input(s): GLUCAP in the last 168 hours. Lipid Profile: No results for input(s): CHOL, HDL, LDLCALC, TRIG, CHOLHDL, LDLDIRECT in the last 72 hours. Thyroid Function Tests: No results for input(s): TSH, T4TOTAL, FREET4, T3FREE, THYROIDAB in the last 72 hours. Anemia Panel: No results for input(s): VITAMINB12, FOLATE, FERRITIN, TIBC, IRON, RETICCTPCT in the last 72 hours. Urine analysis:    Component Value Date/Time   COLORURINE YELLOW 12/23/2019 1330   APPEARANCEUR CLEAR 12/23/2019 1330   LABSPEC 1.014 12/23/2019 1330   PHURINE 5.0 12/23/2019 1330   GLUCOSEU NEGATIVE 12/23/2019 1330   HGBUR NEGATIVE 12/23/2019 1330   HGBUR trace-intact 05/14/2010 1402   BILIRUBINUR NEGATIVE 12/23/2019 1330   KETONESUR NEGATIVE 12/23/2019 1330   PROTEINUR NEGATIVE 12/23/2019 1330   UROBILINOGEN 1.0 03/30/2015 1540   NITRITE NEGATIVE 12/23/2019 1330   LEUKOCYTESUR NEGATIVE 12/23/2019 1330   Sepsis  Labs: @LABRCNTIP (procalcitonin:4,lacticidven:4) ) Recent Results (from the past 240 hour(s))  SARS Coronavirus 2 by RT PCR (hospital order, performed in [place] hospital lab) Nasopharyngeal Nasopharyngeal Swab     Status: None   Collection Time: 01/18/20  9:37 PM   Specimen: Nasopharyngeal Swab  Result Value Ref Range Status   SARS Coronavirus 2 NEGATIVE NEGATIVE Final    Comment: (NOTE) SARS-CoV-2 target nucleic acids are NOT DETECTED.  The SARS-CoV-2 RNA is generally detectable in upper and lower respiratory specimens during the acute phase of infection. The lowest concentration of SARS-CoV-2 viral copies this assay can detect is 250 copies / mL. A negative result does not preclude SARS-CoV-2 infection and should not be used as the sole basis for treatment or other patient management decisions.  A negative result may occur with improper specimen collection / handling, submission of specimen other than nasopharyngeal swab, presence of viral mutation(s) within the areas targeted by this assay, and inadequate number of viral copies (<250 copies / mL). A negative result must be combined with clinical observations, patient history, and epidemiological information.  Fact Sheet for Patients:   StrictlyIdeas.no  Fact Sheet for Healthcare Providers: BankingDealers.co.za  This test is not yet approved or  cleared by the [place] FDA and has been authorized for detection and/or diagnosis of SARS-CoV-2 by FDA under an Emergency Use Authorization (EUA).  This EUA will remain in effect (meaning this test can be used) for the duration of the COVID-19 declaration under Section 564(b)(1) of the Act, 21 U.S.C. section 360bbb-3(b)(1), unless the authorization is terminated or revoked sooner.  Performed at [facility], [place] [address], [place], [place] [address]      Radiological Exams on Admission: CT Head Wo  Contrast  Result Date: 01/18/2020 CLINICAL DATA:  Mental status change EXAM: CT HEAD WITHOUT CONTRAST TECHNIQUE: Contiguous axial images were obtained from the base of the skull through the vertex without intravenous contrast. COMPARISON:  None. FINDINGS: Brain: No evidence of acute territorial infarction, hemorrhage, hydrocephalus,extra-axial collection or mass lesion/mass effect. There is low-attenuation changes in the deep white matter consistent with small vessel ischemia. Ventricles are normal in size and contour. Vascular: No hyperdense vessel or unexpected calcification. Skull: The skull is intact. No fracture or focal lesion identified. Sinuses/Orbits: Small amount of fluid seen within the left maxillary sinus and right sphenoid sinus. The orbits and globes intact. Other:  None IMPRESSION: No acute intracranial abnormality. Findings consistent chronic small vessel ischemia Electronically Signed   By: Prudencio Pair M.D.   On: 01/18/2020 22:13   DG Chest Port 1 View  Result Date: 01/18/2020 CLINICAL DATA:  Altered mental status, status post fall. EXAM: PORTABLE CHEST 1 VIEW COMPARISON:  December 23, 2019 FINDINGS: Decreased lung volumes are seen which is likely, in part, secondary to the degree of patient inspiration. There is no evidence of acute infiltrate, pleural effusion or pneumothorax. Numerous radiopaque overlying cardiac lead wires are present. The heart size and mediastinal contours are within normal limits. There is marked severity calcification of the aortic arch. No acute osseous abnormalities are identified. IMPRESSION: Low lung volumes without evidence of acute or active cardiopulmonary disease. Electronically Signed   By: Virgina Norfolk M.D.   On: 01/18/2020 22:18   DG HIPS BILAT WITH PELVIS 3-4 VIEWS  Result Date: 01/18/2020 CLINICAL DATA:  Status post fall. EXAM: DG HIP (WITH OR WITHOUT PELVIS) 3-4V BILAT COMPARISON:  None. FINDINGS: There is no evidence of hip fracture or dislocation.  There is no evidence of arthropathy or other focal bone abnormality. IMPRESSION: Negative. Electronically Signed   By: Virgina Norfolk M.D.   On: 01/18/2020 22:16    EKG: Independently reviewed.  Normal sinus rhythm prolonged QTC.  Assessment/Plan Principal Problem:   Acute encephalopathy Active Problems:   Hypertension   CAD in native artery   AKI (acute kidney injury) ([place])    1. Acute encephalopathy cause not clear could be from acute renal failure in the setting of some medications for which I am stopping of gabapentin Flexeril and continue with gentle hydration follow metabolic panel.  I would also in addition check ammonia, TSH B12 levels.  Not sure if patient is developing dementia.  Will need to get further history from husband when available.  Note that UA still pending. 2. Acute renal failure could be from dehydration.  Patient's hemoglobin which is usually around 8 is around 12 at admission which suggests probably from dehydration.  UA is pending.  Continue hydration. 3. Diabetes mellitus type 2 we will keep patient on sliding scale coverage. 4. Hyponatremia likely from dehydration follow metabolic panel after hydration. 5. Hypertension on metoprolol. 6. History of systolic heart failure presently receiving fluids due to renal failure and dehydration. 7. Previous history of GI bleed work-up was negative last year.  No signs of bleed at this time follow CBC.  Patient's baseline hemoglobin is usually around 8. 8. Hyperlipidemia on statins. 9. Hypothyroidism on Synthroid. 10. Patient is on hydroxychloroquine not sure why patient is on left.  Will hold off for now since patient's QTC mildly prolonged. 11. History of depression on Lexapro.   DVT prophylaxis: SCDs for now.  In case patient needs further work-up including lumbar puncture. Code Status: Full code. Family Communication: We will need to discuss with patient's husband. Disposition Plan: To be determined. Consults  called: None. Admission status: Observation.   Rise Patience MD [facility] Pager [phone].  If 7PM-7AM, please contact night-coverage [email or web address] Password [email or web address]  01/19/2020, 1:55 AM

## 2020-01-19 NOTE — Progress Notes (Signed)
[facility] ([facility] Community Based Palliative Care       This patient is enrolled in our palliative care services in the community.  [facility] will continue to follow for any discharge planning needs and to coordinate continuation of palliative care.   If you have questions or need assistance, please call [phone] or contact the hospital Liaison listed on AMION.     Thank you for the opportunity to participate in this patient's care.     Chrislyn King, BSN, RN [facility] Hospital Liaison   [phone]   

## 2020-01-19 NOTE — Telephone Encounter (Signed)
Rec'd call from patient's daughter-in-law, Brayton Layman, stating that the patient has been admitted to [facility],  I have notified the Liaison Team and the Palliative NP.

## 2020-01-19 NOTE — Progress Notes (Signed)
OT Cancellation Note  Patient Details Name: Chloe Harding MRN: [id number] DOB: 11/26/1945   Cancelled Treatment:    Reason Eval/Treat Not Completed: Fatigue/lethargy limiting ability to participate;Other (comment) Attempted pt x2 this AM. First attempted, pt too lethargic to open eyes or verbally respond despite several attempts.  2nd Attempt, pt off floor for CT.  Will continue efforts as able.   Julien Girt 01/19/2020, 12:45 PM

## 2020-01-19 NOTE — ED Notes (Signed)
Unable to get 2nd set of cultures pt unhappy

## 2020-01-19 NOTE — Progress Notes (Signed)
  Patient seen and examined personally, I reviewed the chart, history and physical and admission note, done by admitting physician this morning and agree with the same with following addendum.  Please refer to the morning admission note for more detailed plan of care.  Brief narrative,  74 year old female with diabetes mellitus, CHF, chronic anemia, hyperlipidemia, hypertension recent multiple admission comes in with confusion over the last 24 hours and had multiple falls. In the ED CT head unremarkable chest x-ray no acute finding labs showed AKI w/ creat 1.3, hyponatremia 129, dehydration hemoconcentration with elevated hemoglobin 12 gm baseline being around 8. bnp 439.  In June she was admitted with CHF exacerbation/hem+ stool/pneumonia- EF 02% grade 1 diastolic dysfunction,iron deficiency anemia received IV iron, previous EGD 9/20 with esophageal stenosis with dilatation and plan was for outpatient GI follow-up.  Again admitted 8/7-8/11:altered mental status falls at home unsteady gait with negative CT head/C-spine normal TSH ammonia slightly low vitamin D, heme positive stool, given her endoscopy in 2020 and colonoscopy in 2019 with rectosigmoid polyp resection diverticulosis, discussed with Dr. Benson Norway on last admission and given  history of erosive and severe esophagitis advised PPI on discharge.  Seen this morning few times. Earlier in the morning she was trying to get out of the bed.  Confused anxious moving all extremities. Her symptoms helping her to stay in the bed.  Later on patient appeared calm was resting, sleepy but would wake up, unable to tell me her name or where she is No fever no neck stiffness, nonfocal grossly  Nursing report patient had episode of vomiting and incontinence on her way from the ED. Bladder scan showed urine retention 339 mL.  In and out cath ordered along with you urine analysis and culture.  Daughter reports she fell on 1 am Monday, hit her head and bruised her  head,could not recognize family.She had similar confusion but worse this time she thinks.  Issue: Acute encephalopathy,unclear etiology suspecting metabolic,no neck stiffness no fever, nonfocal grossly.  TSH ammonia B12- normal.  Will check UA. NPO. until more alert awake oriented.  Getting CT abdomen.  Has had similar previous admissions.CT head in the ED no acute finding. Keep on IV hydration. Ct abdomen report s came back and has constipation/impacation-contributing to urine retention and wonder if it is contributing to pain/confusion.If does not improve quickly will consider MRI brain/neurology consultation.Keep on fall precautions, PT OT.SLP eval  Dehydration: Cont on ivf. Vomiting in the ED: Will obtain CT abdomen pelvis-showed constipation-impaction: try manual disimpaction and dulcolax PR. Urine retention:Prior narcotic and check UA. AKI-creatinine improved with IV fluids and keep on IV fluids. Dehydration-continue Fluids. Hyponatremia-resolved. Type 2 diabetes mellitus on sliding scale. Hypertension/HLD. Chronic systolic CHF and grade 1 diastolic dysfunction.Monitor closely while on IV fluids. Hypothyroidism on Synthroid. Depression on Lexapro. Borderline sugar-change to ivf w/ d5ns. Chronic anemia,hb baseline 8 gm or so- hemoconcentration on admission. Blackish stool- check fobt. Of note has had fobt + in the past. Hold lovenox for now and change to SCD- if no bleeding and h/h stable resume lovenox tomorrow. I updated her daughter

## 2020-01-19 NOTE — Progress Notes (Signed)
SLP Cancellation Note  Patient Details Name: Chloe Harding MRN: [id number] DOB: 10-21-1945   Cancelled treatment:       Reason Eval/Treat Not Completed: Fatigue/lethargy limiting ability to participate. Recommend continue NPO while pt has poor alertness and increased confusion. SLP will continue efforts to complete swallow evaluation.  Leahanna Buser B. Quentin Ore, [facility], [place] Speech Language Pathologist Office: [phone]  Shonna Chock 01/19/2020, 3:42 PM

## 2020-01-20 ENCOUNTER — Observation Stay (HOSPITAL_COMMUNITY): Payer: Medicare Other

## 2020-01-20 DIAGNOSIS — F329 Major depressive disorder, single episode, unspecified: Secondary | ICD-10-CM | POA: Diagnosis present

## 2020-01-20 DIAGNOSIS — R54 Age-related physical debility: Secondary | ICD-10-CM | POA: Diagnosis present

## 2020-01-20 DIAGNOSIS — I159 Secondary hypertension, unspecified: Secondary | ICD-10-CM | POA: Diagnosis not present

## 2020-01-20 DIAGNOSIS — I5042 Chronic combined systolic (congestive) and diastolic (congestive) heart failure: Secondary | ICD-10-CM | POA: Diagnosis present

## 2020-01-20 DIAGNOSIS — R195 Other fecal abnormalities: Secondary | ICD-10-CM | POA: Diagnosis not present

## 2020-01-20 DIAGNOSIS — K5641 Fecal impaction: Secondary | ICD-10-CM | POA: Diagnosis present

## 2020-01-20 DIAGNOSIS — I130 Hypertensive heart and chronic kidney disease with heart failure and stage 1 through stage 4 chronic kidney disease, or unspecified chronic kidney disease: Secondary | ICD-10-CM | POA: Diagnosis present

## 2020-01-20 DIAGNOSIS — E1165 Type 2 diabetes mellitus with hyperglycemia: Secondary | ICD-10-CM | POA: Diagnosis present

## 2020-01-20 DIAGNOSIS — G9341 Metabolic encephalopathy: Secondary | ICD-10-CM | POA: Diagnosis present

## 2020-01-20 DIAGNOSIS — K222 Esophageal obstruction: Secondary | ICD-10-CM | POA: Diagnosis present

## 2020-01-20 DIAGNOSIS — E11649 Type 2 diabetes mellitus with hypoglycemia without coma: Secondary | ICD-10-CM | POA: Diagnosis not present

## 2020-01-20 DIAGNOSIS — R569 Unspecified convulsions: Secondary | ICD-10-CM | POA: Diagnosis not present

## 2020-01-20 DIAGNOSIS — E785 Hyperlipidemia, unspecified: Secondary | ICD-10-CM | POA: Diagnosis present

## 2020-01-20 DIAGNOSIS — E871 Hypo-osmolality and hyponatremia: Secondary | ICD-10-CM | POA: Diagnosis present

## 2020-01-20 DIAGNOSIS — K2210 Ulcer of esophagus without bleeding: Secondary | ICD-10-CM | POA: Diagnosis present

## 2020-01-20 DIAGNOSIS — E860 Dehydration: Secondary | ICD-10-CM | POA: Diagnosis present

## 2020-01-20 DIAGNOSIS — Z20822 Contact with and (suspected) exposure to covid-19: Secondary | ICD-10-CM | POA: Diagnosis present

## 2020-01-20 DIAGNOSIS — S065X9A Traumatic subdural hemorrhage with loss of consciousness of unspecified duration, initial encounter: Secondary | ICD-10-CM | POA: Diagnosis present

## 2020-01-20 DIAGNOSIS — Z7189 Other specified counseling: Secondary | ICD-10-CM | POA: Diagnosis not present

## 2020-01-20 DIAGNOSIS — N179 Acute kidney failure, unspecified: Secondary | ICD-10-CM | POA: Diagnosis present

## 2020-01-20 DIAGNOSIS — I10 Essential (primary) hypertension: Secondary | ICD-10-CM | POA: Diagnosis not present

## 2020-01-20 DIAGNOSIS — N1831 Chronic kidney disease, stage 3a: Secondary | ICD-10-CM | POA: Diagnosis present

## 2020-01-20 DIAGNOSIS — D631 Anemia in chronic kidney disease: Secondary | ICD-10-CM | POA: Diagnosis present

## 2020-01-20 DIAGNOSIS — E7800 Pure hypercholesterolemia, unspecified: Secondary | ICD-10-CM | POA: Diagnosis present

## 2020-01-20 DIAGNOSIS — D500 Iron deficiency anemia secondary to blood loss (chronic): Secondary | ICD-10-CM | POA: Diagnosis not present

## 2020-01-20 DIAGNOSIS — Z515 Encounter for palliative care: Secondary | ICD-10-CM | POA: Diagnosis not present

## 2020-01-20 DIAGNOSIS — I2510 Atherosclerotic heart disease of native coronary artery without angina pectoris: Secondary | ICD-10-CM | POA: Diagnosis present

## 2020-01-20 DIAGNOSIS — R4182 Altered mental status, unspecified: Secondary | ICD-10-CM | POA: Diagnosis not present

## 2020-01-20 DIAGNOSIS — E876 Hypokalemia: Secondary | ICD-10-CM | POA: Diagnosis not present

## 2020-01-20 DIAGNOSIS — G9340 Encephalopathy, unspecified: Secondary | ICD-10-CM | POA: Diagnosis present

## 2020-01-20 DIAGNOSIS — E1122 Type 2 diabetes mellitus with diabetic chronic kidney disease: Secondary | ICD-10-CM | POA: Diagnosis present

## 2020-01-20 DIAGNOSIS — K449 Diaphragmatic hernia without obstruction or gangrene: Secondary | ICD-10-CM | POA: Diagnosis present

## 2020-01-20 DIAGNOSIS — W19XXXA Unspecified fall, initial encounter: Secondary | ICD-10-CM | POA: Diagnosis present

## 2020-01-20 DIAGNOSIS — E039 Hypothyroidism, unspecified: Secondary | ICD-10-CM | POA: Diagnosis present

## 2020-01-20 LAB — GLUCOSE, CAPILLARY
Glucose-Capillary: 86 mg/dL (ref 70–99)
Glucose-Capillary: 85 mg/dL (ref 70–99)
Glucose-Capillary: 93 mg/dL (ref 70–99)
Glucose-Capillary: 83 mg/dL (ref 70–99)

## 2020-01-20 LAB — FOLATE: Folate: 66.6 ng/mL (ref >5.9)

## 2020-01-20 LAB — URINE CULTURE: Culture: NO GROWTH

## 2020-01-20 MED ORDER — HYDRALAZINE HCL 20 MG/ML IJ SOLN: 10.0000 mg | Freq: Four times a day (QID) | INTRAMUSCULAR | Status: DC | PRN

## 2020-01-20 MED ORDER — STERILE WATER FOR INJECTION IJ SOLN
INTRAMUSCULAR | Status: AC
  Administered 2020-01-20: 10 mL
  Filled 2020-01-20: qty 10

## 2020-01-20 MED ORDER — ACETAMINOPHEN 650 MG RE SUPP
650.0000 mg | RECTAL | Status: DC | PRN
  Administered 2020-01-20 – 2020-01-25 (×3): 650 mg via RECTAL
  Filled 2020-01-20 (×4): qty 1

## 2020-01-20 MED ORDER — PANTOPRAZOLE SODIUM 40 MG IV SOLR
40.0000 mg | INTRAVENOUS | Status: DC
  Administered 2020-01-20 – 2020-01-21 (×2): 40 mg via INTRAVENOUS
  Filled 2020-01-20 (×2): qty 40

## 2020-01-20 MED ORDER — SORBITOL 70 % SOLN
960.0000 mL | TOPICAL_OIL | Freq: Every day | ORAL | Status: DC
  Filled 2020-01-20: qty 473

## 2020-01-20 NOTE — Consult Note (Addendum)
GI Consultation Covering for Drs. Adriana Mccallum  Referring Provider:  [facility]/ Dwyane Dee  Primary Care Physician:  [name], NP Primary Gastroenterologist:  Dr. Carol Ada  Reason for Consultation: anemia, heme + stool  HPI: Chloe Harding is a 74 y.o. female, known to Dr. Adriana Mccallum with prior history of iron deficiency anemia.  She is admitted to the hospital yesterday after being brought in by her family with progressive confusion and recent falls.  She has history of diabetes mellitus, congestive heart failure, chronic anemia and hypertension. Initial CT of the head was negative. Labs on admit showed hemoglobin 9.6 hematocrit of 29.6, sodium 129/creatinine 1.31/calcium 10.4, stool documented heme positive. She had a recent admission in August 2021 with fever and confusion.  Cultures were negative, she was treated empirically with antibiotics.  She was also noted to be anemic at that time.  Hospitalist discussed with Dr. Benson Norway who per notes suggested that she may have erosive esophagitis and to continue PPI therapy. She was noted to be iron deficient at that time mildly with serum iron of 28 TIBC 175 iron sat of 13, and hemoglobin in the 8 range. Reviewing her labs hemoglobin has been in the 8-9 range over the past year or so.  In 2019 hemoglobin was 11.3. She has undergone prior GI work-up with colonoscopy in January 2019 done for anemia and iron deficiency with finding of a 3 mm polyp in the rectosigmoid which was an inflammatory polyp and multiple diverticuli. She had EGD in September 2020 with finding of 2 benign esophageal strictures.  These required Savary dilation from 8 mm to 12 mm.  She had a severe stricture at 22 cm.  Noted to have a 7 cm hiatal hernia and otherwise negative exam.  At that time serial EGDs with dilations were recommended. I do not have access to Dr. Ulyses Amor records this weekend, unclear whether she had follow-up procedures.  She may have had outpatient capsule endoscopy as  well.   Patient unable to participate in any conversation today She is on Protonix 40 mg daily at home, no blood thinners or NSAIDs on her list.  CT of the abdomen and pelvis was done without contrast yesterday, no evidence of bowel obstruction, sigmoid diverticulosis, there is a large amount of stool in the distal sigmoid and rectum concerning for possible impaction mild thickening of the rectum with surrounding inflammatory changes concerning for proctitis   Past Medical History:  Diagnosis Date  . Arthritis   . Depression   . Diabetes mellitus   . Hiatal hernia   . Hypercholesteremia   . Hypertension   . Reflux   . Renal disorder    shutting down 4 years ago    Past Surgical History:  Procedure Laterality Date  . CHOLECYSTECTOMY    . COLONOSCOPY WITH PROPOFOL N/A 05/20/2017   Procedure: COLONOSCOPY WITH PROPOFOL;  Surgeon: Carol Ada, MD;  Location: [facility];  Service: Endoscopy;  Laterality: N/A;  . ESOPHAGOGASTRODUODENOSCOPY N/A 05/19/2017   Procedure: ESOPHAGOGASTRODUODENOSCOPY (EGD);  Surgeon: Juanita Craver, MD;  Location: [facility] ENDOSCOPY;  Service: Endoscopy;  Laterality: N/A;  . ESOPHAGOGASTRODUODENOSCOPY (EGD) WITH PROPOFOL N/A 01/17/2019   Procedure: ESOPHAGOGASTRODUODENOSCOPY (EGD) WITH PROPOFOL;  Surgeon: Carol Ada, MD;  Location: [facility] ENDOSCOPY;  Service: Endoscopy;  Laterality: N/A;  . SAVORY DILATION N/A 01/17/2019   Procedure: SAVORY DILATION;  Surgeon: Carol Ada, MD;  Location: [facility] ENDOSCOPY;  Service: Endoscopy;  Laterality: N/A;    Prior to Admission medications   Medication Sig Start  Date End Date Taking? Authorizing Provider  amitriptyline (ELAVIL) 25 MG tablet Take 25 mg by mouth at bedtime.   Yes [provider]  atorvastatin (LIPITOR) 20 MG tablet Take 1 tablet (20 mg total) by mouth daily. 11/10/19  Yes Amin, Ankit Chirag, MD  bisacodyl (DULCOLAX) 5 MG EC tablet Take 1 tablet (5 mg total) by mouth daily as needed for moderate constipation.  11/09/19  Yes Amin, Ankit Chirag, MD  CONSTULOSE 10 GM/15ML solution Take 10 g by mouth daily as needed for mild constipation.  10/25/19  Yes [provider]  cyclobenzaprine (FLEXERIL) 10 MG tablet Take 10 mg by mouth 3 (three) times daily. 11/27/19  Yes [provider]  escitalopram (LEXAPRO) 10 MG tablet Take 10 mg by mouth daily. 11/18/19  Yes [provider]  ferrous sulfate 325 (65 FE) MG tablet Take 1 tablet (325 mg total) by mouth 2 (two) times daily with a meal. 11/09/19  Yes Amin, Ankit Chirag, MD  folic acid (FOLVITE) 1 MG tablet Take 1 mg by mouth daily.   Yes [provider]  furosemide (LASIX) 40 MG tablet Take 1 tablet (40 mg total) by mouth daily. 11/10/19  Yes Amin, Ankit Chirag, MD  gabapentin (NEURONTIN) 400 MG capsule Take 400 mg by mouth 3 (three) times daily. 10/03/19  Yes [provider]  HYDROcodone-acetaminophen (NORCO/VICODIN) 5-325 MG tablet Take 1 tablet by mouth every 6 (six) hours as needed for moderate pain. 12/27/19  Yes Amin, Jeanella Flattery, MD  hydroxychloroquine (PLAQUENIL) 200 MG tablet Take 200 mg by mouth daily. 10/04/19  Yes [provider]  JANUVIA 100 MG tablet Take 100 mg by mouth daily. 10/02/19  Yes [provider]  metoprolol tartrate (LOPRESSOR) 25 MG tablet Take 1 tablet (25 mg total) by mouth 2 (two) times daily. 11/09/19  Yes Amin, Ankit Chirag, MD  pantoprazole (PROTONIX) 40 MG tablet Take 1 tablet (40 mg total) by mouth 2 (two) times daily. 01/18/19  Yes Mariel Aloe, MD  polyethylene glycol (MIRALAX / GLYCOLAX) 17 g packet Take 17 g by mouth daily as needed for moderate constipation or severe constipation. 11/09/19  Yes Amin, Ankit Chirag, MD  potassium chloride (KLOR-CON) 10 MEQ tablet Take 10 mEq by mouth daily. 10/25/19  Yes [provider]  ropinirole (REQUIP) 5 MG tablet Take 5 mg by mouth at bedtime. 10/02/19  Yes [provider]  senna-docusate (SENOKOT-S) 8.6-50 MG tablet Take 1  tablet by mouth at bedtime. 11/09/19  Yes Amin, Jeanella Flattery, MD  Vitamin D3 (VITAMIN D) 25 MCG tablet Take 1 tablet (1,000 Units total) by mouth daily. 12/27/19  Yes Damita Lack, MD    Current Facility-Administered Medications  Medication Dose Route Frequency Provider Last Rate Last Admin  . atorvastatin (LIPITOR) tablet 20 mg  20 mg Oral Daily Rise Patience, MD      . bisacodyl (DULCOLAX) suppository 10 mg  10 mg Rectal Daily PRN Antonieta Pert, MD   10 mg at 01/19/20 1349  . dextrose 5 %-0.9 % sodium chloride infusion   Intravenous Continuous Antonieta Pert, MD 50 mL/hr at 01/20/20 0654 Rate Verify at 01/20/20 0654  . escitalopram (LEXAPRO) tablet 10 mg  10 mg Oral Daily Rise Patience, MD      . ferrous sulfate tablet 325 mg  325 mg Oral BID PC Rise Patience, MD      . folic acid (FOLVITE) tablet 1 mg  1 mg Oral Daily Rise Patience, MD      .  hydrALAZINE (APRESOLINE) injection 5 mg  5 mg Intravenous Q8H PRN Kc, Ramesh, MD      . insulin aspart (novoLOG) injection 0-6 Units  0-6 Units Subcutaneous TID WC Rise Patience, MD      . metoprolol tartrate (LOPRESSOR) tablet 25 mg  25 mg Oral BID Rise Patience, MD      . pantoprazole (PROTONIX) EC tablet 40 mg  40 mg Oral BID AC Rise Patience, MD      . rOPINIRole (REQUIP) tablet 5 mg  5 mg Oral QHS Rise Patience, MD   5 mg at 01/19/20 0252    Allergies as of 01/18/2020 - Review Complete 01/18/2020  Allergen Reaction Noted  . Amlodipine besylate Swelling 12/26/2009    Family History  Problem Relation Age of Onset  . Heart attack Sister   . Heart failure Sister   . Heart attack Brother   . Heart failure Brother     Social History   Socioeconomic History  . Marital status: Married    Spouse name: Not on file  . Number of children: Not on file  . Years of education: Not on file  . Highest education level: Not on file  Occupational History  . Not on file  Tobacco Use  . Smoking  status: Never Smoker  . Smokeless tobacco: Never Used  Vaping Use  . Vaping Use: Never used  Substance and Sexual Activity  . Alcohol use: No  . Drug use: No  . Sexual activity: Not Currently  Other Topics Concern  . Not on file  Social History Narrative  . Not on file   Social Determinants of Health   Financial Resource Strain:   . Difficulty of Paying Living Expenses: Not on file  Food Insecurity:   . Worried About Charity fundraiser in the Last Year: Not on file  . Ran Out of Food in the Last Year: Not on file  Transportation Needs:   . Lack of Transportation (Medical): Not on file  . Lack of Transportation (Non-Medical): Not on file  Physical Activity:   . Days of Exercise per Week: Not on file  . Minutes of Exercise per Session: Not on file  Stress:   . Feeling of Stress : Not on file  Social Connections:   . Frequency of Communication with Friends and Family: Not on file  . Frequency of Social Gatherings with Friends and Family: Not on file  . Attends Religious Services: Not on file  . Active Member of Clubs or Organizations: Not on file  . Attends Archivist Meetings: Not on file  . Marital Status: Not on file  Intimate Partner Violence:   . Fear of Current or Ex-Partner: Not on file  . Emotionally Abused: Not on file  . Physically Abused: Not on file  . Sexually Abused: Not on file    Review of Systems: Pertinent positive and negative review of systems were noted in the above HPI section.  All other review of systems was otherwise negative.   Physical Exam: Vital signs in last 24 hours: Temp:  [97.9 F (36.6 C)-99.3 F (37.4 C)] 97.9 F (36.6 C) (09/04 0900) Pulse Rate:  [99-108] 99 (09/04 0900) Resp:  [15-20] 17 (09/04 0900) BP: (133-153)/(66-89) 153/73 (09/04 0900) SpO2:  [95 %-98 %] 95 % (09/04 0900) Last BM Date: 01/20/20 General:   Alert, well-developed, older white female, mumbling but does not seem to directly respond to questions,  unable to decipher  Head:  Normocephalic and atraumatic. Eyes:  Sclera clear, no icterus.   Conjunctiva pink. Ears:  Normal auditory acuity. Nose:  No deformity, discharge,  or lesions. Mouth:  No deformity or lesions.   Neck:  Supple; no masses or thyromegaly. Lungs:  Clear throughout to auscultation.   No wheezes, crackles, or rhonchi.  Heart:  Regular rate and rhythm; no murmurs, clicks, rubs,  or gallops. Abdomen:  Soft, mildly tender in the lower abdomen left lower quadrant and suprapubic area, BS active, nonpalp mass or hsm.   Rectal:  Deferred  Msk:  Symmetrical without gross deformities. . Pulses:  Normal pulses noted. Extremities:  Without clubbing or edema. Neurologic:  Alert disoriented, mumbling  Skin:  Intact without significant lesions or rashes..   Intake/Output from previous day: 09/03 0701 - 09/04 0700 In: 791.7 [I.V.:791.7] Out: 650 [Urine:650] Intake/Output this shift: No intake/output data recorded.  Lab Results: Recent Labs    01/18/20 2032 01/19/20 0330  WBC 6.7 8.5  HGB 12.4 9.6*  HCT 37.4 29.6*  PLT 240 184   BMET Recent Labs    01/18/20 2032 01/19/20 0330  NA 129* 134*  K 4.2 4.6  CL 100 102  CO2 23 23  GLUCOSE 102* 97  BUN 14 16  CREATININE 1.31* 1.08*  CALCIUM 10.4* 10.2     IMPRESSION:  #70 74 year old white female admitted with progressive confusion over the past few days and multiple falls at home.  She is disoriented.  Etiology of acute encephalopathy not clear, CT of the head negative, MRI pending, venous ammonia normal, UA negative. Question underlying dementia  #2 anemia and heme positive stool-not new for this patient, she has prior history of anemia and iron deficiency with work-up in 2019 and in 2020. Hemoglobin is about at her baseline compared to earlier this year. Colonoscopy 2019 unrevealing as to source EGD September 2020 with 2 severe esophageal strictures lumen initially 8 to 9 mm and dilated to 12 mm, large hiatal  hernia.  Followed by Dr. Mann/Hung-may have had other outpatient work-up i.e. capsule endoscopy.  It is certainly possible with her large hiatal hernia that she has Lysbeth Galas erosions which can contribute to anemia and iron deficiency, consider chronic esophagitis. Consider AVMs.  #3 obstipation and probable fecal impaction with associated mild proctitis per CT  #4 adult onset diabetes mellitus #5 Acute kidney injury felt secondary to dehydration #6 hypothyroid #7 depression  Plan:  Patient may or may not need further GI evaluation after acute issues/encephalopathy further investigated. Defer to Dr. Benson Norway who can access his office records.   Would trend hemoglobin, transfuse for hemoglobin 7 or less Cover with IV PPI given history of severe esophageal strictures.  SMOG enema. Once she is able to take p.o.'s then gentle bowel purge.  Addendum; nursing reports that patient has had several bowel movements today, with formed stools.  Have canceled the smog enemas.  We will check KUB in a.m. to assess clearance of the obstipation and fecal impaction.   Amy EsterwoodPA-C  01/20/2020, 1:16 PM     Attending Physician Note   I have taken a history, examined the patient and reviewed the chart. I agree with the Advanced Practitioner's note, impression and recommendations.  Impression: * Anemia and heme positive stool. No overt GI bleeding. History of iron deficiency and heme positive stool over the past few years with colonoscopy in 05/2017 negative and EGD in 01/2019 showing a large hiatal hernia and an esophageal stricture. R/O Cameron erosions, AVMs, etc.  *  Obstipation, probable fecal impaction, resolving  Recommendation: * Pantoprazole 40 mg IV qd for now * Laxative regimen  * Trend CBC, transfusion to maintain Hgb > 7 * Patient may or may not need further GI evaluation after acute issues/encephalopathy are further investigated and have improved. Defer to Dr. Benson Norway who will follow up on  Tuesday to resume her GI care.   Lucio Edward, MD [facility]

## 2020-01-20 NOTE — Plan of Care (Signed)
  Problem: Activity: Goal: Risk for activity intolerance will decrease Outcome: Progressing   Problem: Nutrition: Goal: Adequate nutrition will be maintained Outcome: Progressing   Problem: Coping: Goal: Level of anxiety will decrease Outcome: Progressing   Problem: Clinical Measurements: Goal: Respiratory complications will improve Outcome: Progressing   Problem: Clinical Measurements: Goal: Cardiovascular complication will be avoided Outcome: Progressing   Problem: Safety: Goal: Ability to remain free from injury will improve Outcome: Progressing   Problem: Skin Integrity: Goal: Risk for impaired skin integrity will decrease Outcome: Progressing

## 2020-01-20 NOTE — Progress Notes (Signed)
Occupational Therapy Evaluation  Patient with functional deficits listed below impacting safety and independence with self care. Patient unable to follow 1 step directions, very minimal communication with therapists uttering 1-2 words intermittently otherwise softly whimpering/moaning and unable to communicate why. Patient total A x2 for bed mobility, did not attempt standing at this time as patient starts to lean onto OT's arm with increased moaning. Recommend continued acute OT services to maximize patient safety and independence with ADLs + functional transfers.    01/20/20 1300  OT Visit Information  Last OT Received On 01/20/20  Assistance Needed +2  PT/OT/SLP Co-Evaluation/Treatment Yes  Reason for Co-Treatment Necessary to address cognition/behavior during functional activity;For patient/therapist safety;To address functional/ADL transfers  OT goals addressed during session ADL's and self-care  History of Present Illness 74 year old with history of DM2, HTN, HLD, CAD, CHF, CKD stage IIIa presents with generalized weakness and multiple falls at home. Pt previously admitted for CHF exacerbation/pneumonia and heme positive stools. Intermittent behavioral disturbances at home.  Upon admission she was febrile, acute kidney injury, hyponatremia, metabolic encephalopathy.  Precautions  Precautions Fall  Restrictions  Weight Bearing Restrictions No  Home Living  Family/patient expects to be discharged to: Private residence  Living Arrangements Spouse/significant other  Available Help at Discharge Available PRN/intermittently  Type of [place] to enter  Entrance Stairs-Number of Steps 10 in front, ramp in the back  [place] One level  Bathroom Shower/Tub Walk-in shower  Bathroom Toilet Handicapped height  Bathroom Accessibility Yes  How Accessible Accessible via [place] - 2 wheels;Cane - single point;Toilet riser   Additional Comments Information obtained from previous eval, patient unreliable narrator   Prior Function  Level of Independence Independent with assistive device(s)  Comments Info obtained from previous eval, unsure how much assistance family has been providing  Communication  Communication Expressive difficulties (very minimal communication with 1-2 mumbled words)  Pain Assessment  Pain Assessment Faces  Faces Pain Scale 2  Pain Location patient unable to provide info, with sitting up EOB patient becames to whimper  Pain Descriptors / Indicators Moaning  Pain Intervention(s) Monitored during session  Cognition  Arousal/Alertness Awake/alert  Behavior During Therapy Anxious  Overall Cognitive Status No family/caregiver present to determine baseline cognitive functioning  General Comments patient not following 1 step commands such as "squeeze my fingers"  Upper Extremity Assessment  Upper Extremity Assessment Generalized weakness  Lower Extremity Assessment  Lower Extremity Assessment Defer to PT evaluation  ADL  Overall ADL's  Needs assistance/impaired  Grooming Total assistance  Grooming Details (indicate cue type and reason) attempted oral care with swab however patient biting at swab, not opening mouth fully  Upper Body Bathing Total assistance  Lower Body Bathing Total assistance  Upper Body Dressing  Total assistance  Lower Body Dressing Total assistance  Toilet Transfer Details (indicate cue type and reason) did not attempt standing due to limited direction following, patient whimpering at EOB and leaning onto therapist's arm  Toileting- Clothing Manipulation and Hygiene Total assistance  General ADL Comments unsure of patient's baseline however due to decreased cognition patient unable to follow 1 step directions requiring total A for self care  Bed Mobility  Overal bed mobility Needs Assistance  Bed Mobility Supine to Sit;Sit to Supine  Supine to sit Total assist;+2 for  physical assistance;+2 for safety/equipment;HOB elevated  Sit to supine Total assist;+2 for physical assistance;+2 for safety/equipment  General bed mobility comments patient does  not initiate to assist with bed mobility, utilized bed pad to mobilize patient to EOB  Transfers  General transfer comment deferred 2* safety  Balance  Overall balance assessment Needs assistance  Sitting-balance support Bilateral upper extremity supported;Feet supported  Sitting balance-Leahy Scale Poor  OT - End of Session  Activity Tolerance Other (comment) (Treatment limited secondary to cognition)  Patient left in bed;with call bell/phone within reach;with bed alarm set  Nurse Communication Mobility status  OT Assessment  OT Recommendation/Assessment Patient needs continued OT Services  OT Visit Diagnosis Other abnormalities of gait and mobility (R26.89);History of falling (Z91.81);Muscle weakness (generalized) (M62.81);Other symptoms and signs involving cognitive function  OT Problem List Decreased activity tolerance;Decreased strength;Impaired balance (sitting and/or standing);Decreased cognition;Decreased safety awareness;Obesity  OT Plan  OT Frequency (ACUTE ONLY) Min 2X/week  OT Treatment/Interventions (ACUTE ONLY) Self-care/ADL training;Therapeutic exercise;Energy conservation;DME and/or AE instruction;Therapeutic activities;Cognitive remediation/compensation;Patient/family education;Balance training  AM-PAC OT "6 Clicks" Daily Activity Outcome Measure (Version 2)  Help from another person eating meals? 1 (NPO)  Help from another person taking care of personal grooming? 1  Help from another person toileting, which includes using toliet, bedpan, or urinal? 1  Help from another person bathing (including washing, rinsing, drying)? 1  Help from another person to put on and taking off regular upper body clothing? 1  Help from another person to put on and taking off regular lower body clothing? 1  6 Click  Score 6  OT Recommendation  Follow Up Recommendations SNF;Supervision/Assistance - 24 hour  OT Equipment Other (comment) (defer to next venue)  Individuals Consulted  Consulted and Agree with Results and Recommendations Patient unable/family or caregiver not available  Acute Rehab OT Goals  Patient Stated Goal unable to state  OT Goal Formulation Patient unable to participate in goal setting  Time For Goal Achievement 02/03/20  Potential to Achieve Goals Fair  OT Time Calculation  OT Start Time (ACUTE ONLY) 1040  OT Stop Time (ACUTE ONLY) 1053  OT Time Calculation (min) 13 min  OT General Charges  $OT Visit 1 Visit  OT Evaluation  $OT Eval Low Complexity 1 Low  Written Expression  Dominant Hand Right   Delbert Phenix OT OT pager: [phone]

## 2020-01-20 NOTE — Progress Notes (Signed)
PROGRESS NOTE    Chloe Harding  ZOX:[id number] DOB: Mar 05, 1946 DOA: 01/18/2020 PCP: [name], NP   Brief Narrative:  This 74 year old female with diabetes mellitus, CHF, chronic anemia, hyperlipidemia, hypertension, recent multiple hospitalizations presents in the ED with confusion over the last 24 hours and had multiple falls.  Daughter reports she fell few days back,  hit head and bruised her head,  she could not recognize family at that time.  She had similar confusion before but it is worse at this time. In the ED CT head unremarkable, but she continued to remain confused. MRI brain shows thin subdural hematoma along the left cerebral convexity (only 2 mm in thickness). This was not seen on head CT from 2 days ago but may have been too thin to detect by CT. No mass effect. No acute infarct.  Neurology consulted recommended MRI, dementia work-up. Her hemoglobin dropped from 12.4-9.6,  stool for occult blood positive.  GI consulted Dr. Alferd Apa will see the patient.  Admitted 8/7-8/11: altered mental status,  falls at home, unsteady gait with negative CT head /C-spine,  Normal TSH ammonia slightly low vitamin D, heme positive stool, given her endoscopy in 2020 and colonoscopy in 2019 with rectosigmoid polyp resection diverticulosis, discussed with Dr. Benson Norway on last admission and given history of erosive and severe esophagitis, advised PPI on discharge.   Assessment & Plan:   Principal Problem:   Acute encephalopathy Active Problems:   Hypertension   CAD in native artery   AKI (acute kidney injury) ([place])  Acute encephalopathy:  Unclear etiology,  suspecting metabolic, there is no neck stiffness,  No fever, Non focal exam.  TSH,  Ammonia,  B12- normal.  UA:  negative. Continue NPO until more alert,  awake and oriented.  CT abdomen showed impacted stool. Given laxatives, She had 2 big bowel movements,  dark brown stools,  no frank blood noted.  Has had similar previous admissions. CT head  in the ED:  No acute finding.  Neurology consulted recommended MRI and other labs.  MRI shows 2 mm small hematomas, radiology recommended repeat CT head in 24 hours.  Continue IV hydration. Keep on fall precautions, PT OT.SLP eval  AKI-  creatinine improved with IV fluids and continue IV fluids. Hyponatremia- Improving. Type 2 diabetes mellitus on sliding scale. Hypertension/HLD. Continue home meds. Chronic systolic CHF and grade 1 diastolic dysfunction.  Monitor closely while on IV fluids. Hypothyroidism on Synthroid. Depression on Lexapro.  Chronic anemia:  Hb baseline 8 gm or so- hemoconcentration on admission.  Blackish stool-  Stool for occult blood +  Hold lovenox for now and change to SCD- Hemoglobin dropped from 12.4-9.6.  GI consulted awaiting recommendation. Monitor H&H.    DVT prophylaxis:SCDs Code Status: Full Family Communication: No one at bed side. Disposition Plan: Consultants:   Gastroenterology, neurology  Procedures:  Antimicrobials:  Anti-infectives (From admission, onward)   Start     Dose/Rate Route Frequency Ordered Stop   01/19/20 1000  hydroxychloroquine (PLAQUENIL) tablet 200 mg  Status:  Discontinued        200 mg Oral Daily 01/19/20 0154 01/19/20 0449      Subjective: Patient was seen and examined at bedside.  Overnight events noted.  She still appears confused, She is not following commands.  She has 2 big bowel movements, stool appeared dark brown but  No blood noted.  Objective: Vitals:   01/19/20 1339 01/19/20 2153 01/20/20 0552 01/20/20 0900  BP: (!) 151/76 (!) 150/66 133/89 Marland Kitchen)  153/73  Pulse: (!) 108 100 100 99  Resp: 15 20 16 17   Temp: 99.3 F (37.4 C) 99 F (37.2 C) 99 F (37.2 C) 97.9 F (36.6 C)  TempSrc:    Oral  SpO2: 98% 95% 95% 95%  Weight:        Intake/Output Summary (Last 24 hours) at 01/20/2020 1410 Last data filed at 01/20/2020 1300 Gross per 24 hour  Intake 791.69 ml  Output 0 ml  Net 791.69 ml   Filed Weights    01/19/20 0500  Weight: 76.3 kg    Examination:  General exam: Appears calm and comfortable , confused, not following commands. Respiratory system: Clear to auscultation. Respiratory effort normal. Cardiovascular system: S1 & S2 heard, RRR. No JVD, murmurs, rubs, gallops or clicks. No pedal edema. Gastrointestinal system: Abdomen is nondistended, soft and nontender. No organomegaly or masses felt. Normal bowel sounds heard. Central nervous system: Alert and oriented. No focal neurological deficits. Extremities:  No leg swelling, peripheral pulses present. Skin: No rashes, lesions or ulcers Psychiatry: Judgement and insight appear normal. Mood & affect appropriate.     Data Reviewed: I have personally reviewed following labs and imaging studies  CBC: Recent Labs  Lab 01/18/20 2032 01/19/20 0330  WBC 6.7 8.5  NEUTROABS 2.9  --   HGB 12.4 9.6*  HCT 37.4 29.6*  MCV 84.4 87.3  PLT 240 400   Basic Metabolic Panel: Recent Labs  Lab 01/18/20 2032 01/19/20 0330  NA 129* 134*  K 4.2 4.6  CL 100 102  CO2 23 23  GLUCOSE 102* 97  BUN 14 16  CREATININE 1.31* 1.08*  CALCIUM 10.4* 10.2   GFR: Estimated Creatinine Clearance: 44 mL/min (A) (by C-G formula based on SCr of 1.08 mg/dL (H)). Liver Function Tests: No results for input(s): AST, ALT, ALKPHOS, BILITOT, PROT, ALBUMIN in the last 168 hours. No results for input(s): LIPASE, AMYLASE in the last 168 hours. Recent Labs  Lab 01/18/20 2306 01/19/20 0330  AMMONIA <9* 22   Coagulation Profile: No results for input(s): INR, PROTIME in the last 168 hours. Cardiac Enzymes: No results for input(s): CKTOTAL, CKMB, CKMBINDEX, TROPONINI in the last 168 hours. BNP (last 3 results) No results for input(s): PROBNP in the last 8760 hours. HbA1C: No results for input(s): HGBA1C in the last 72 hours. CBG: Recent Labs  Lab 01/19/20 1218 01/19/20 1639 01/19/20 2152 01/20/20 0751 01/20/20 1213  GLUCAP 77 76 94 86 85   Lipid  Profile: No results for input(s): CHOL, HDL, LDLCALC, TRIG, CHOLHDL, LDLDIRECT in the last 72 hours. Thyroid Function Tests: Recent Labs    01/19/20 0330  TSH 0.702   Anemia Panel: Recent Labs    01/19/20 0330  VITAMINB12 956*   Sepsis Labs: Recent Labs  Lab 01/18/20 2032  LATICACIDVEN 0.7    Recent Results (from the past 240 hour(s))  SARS Coronavirus 2 by RT PCR (hospital order, performed in [facility] hospital lab) Nasopharyngeal Nasopharyngeal Swab     Status: None   Collection Time: 01/18/20  9:37 PM   Specimen: Nasopharyngeal Swab  Result Value Ref Range Status   SARS Coronavirus 2 NEGATIVE NEGATIVE Final    Comment: (NOTE) SARS-CoV-2 target nucleic acids are NOT DETECTED.  The SARS-CoV-2 RNA is generally detectable in upper and lower respiratory specimens during the acute phase of infection. The lowest concentration of SARS-CoV-2 viral copies this assay can detect is 250 copies / mL. A negative result does not preclude SARS-CoV-2 infection and should not  be used as the sole basis for treatment or other patient management decisions.  A negative result may occur with improper specimen collection / handling, submission of specimen other than nasopharyngeal swab, presence of viral mutation(s) within the areas targeted by this assay, and inadequate number of viral copies (<250 copies / mL). A negative result must be combined with clinical observations, patient history, and epidemiological information.  Fact Sheet for Patients:   StrictlyIdeas.no  Fact Sheet for Healthcare Providers: BankingDealers.co.za  This test is not yet approved or  cleared by the [place] FDA and has been authorized for detection and/or diagnosis of SARS-CoV-2 by FDA under an Emergency Use Authorization (EUA).  This EUA will remain in effect (meaning this test can be used) for the duration of the COVID-19 declaration under Section  564(b)(1) of the Act, 21 U.S.C. section 360bbb-3(b)(1), unless the authorization is terminated or revoked sooner.  Performed at [facility], [place] [address] [place], [place] [address]   Culture, Urine     Status: None   Collection Time: 01/19/20 10:36 AM   Specimen: Urine, Clean Catch  Result Value Ref Range Status   Specimen Description   Final    URINE, CLEAN CATCH Performed at [facility], [place] [address], [place], [place] [address]    Special Requests   Final    NONE Performed at [facility], [place] [address], [place], [place] [address]    Culture   Final    NO GROWTH Performed at [facility], [place] [address] [place], [place] [address]    Report Status 01/20/2020 FINAL  Final     Radiology Studies: CT ABDOMEN PELVIS WO CONTRAST  Result Date: 01/19/2020 CLINICAL DATA:  Generalized abdominal pain. EXAM: CT ABDOMEN AND PELVIS WITHOUT CONTRAST TECHNIQUE: Multidetector CT imaging of the abdomen and pelvis was performed following the standard protocol without IV contrast. COMPARISON:  December 23, 2019. FINDINGS: Lower chest: Small bilateral pleural effusions are noted. Right posterior basilar atelectasis or infiltrate is noted. Hepatobiliary: No focal liver abnormality is seen. Status post cholecystectomy. No biliary dilatation. Pancreas: Unremarkable. No pancreatic ductal dilatation or surrounding inflammatory changes. Spleen: Normal in size without focal abnormality. Adrenals/Urinary Tract: Adrenal glands are unremarkable. Kidneys are normal, without renal calculi, focal lesion, or hydronephrosis. Bladder is unremarkable. Stomach/Bowel: The stomach appears normal. There is no evidence of bowel obstruction. The appendix is not visualized. Sigmoid diverticulosis is noted. There is large amount of stool seen in the distal sigmoid colon and rectum concerning for impaction. Mild wall thickening of the rectum is noted with  surrounding inflammatory changes concerning for proctitis. Vascular/Lymphatic: Aortic atherosclerosis. No enlarged abdominal or pelvic lymph nodes. Reproductive: Uterus and bilateral adnexa are unremarkable. Other: No abdominal wall hernia or abnormality. No abdominopelvic ascites. Musculoskeletal: No acute or significant osseous findings. IMPRESSION: 1. Small bilateral pleural effusions are noted with right posterior basilar atelectasis or infiltrate. 2. Sigmoid diverticulosis without inflammation. 3. Large amount of stool seen in the distal sigmoid colon and rectum concerning for impaction. Mild wall thickening of the rectum is noted with surrounding inflammatory changes concerning for proctitis. 4. Aortic atherosclerosis. Aortic Atherosclerosis (ICD10-I70.0). Electronically Signed   By: Marijo Conception M.D.   On: 01/19/2020 12:02   CT Head Wo Contrast  Result Date: 01/18/2020 CLINICAL DATA:  Mental status change EXAM: CT HEAD WITHOUT CONTRAST TECHNIQUE: Contiguous axial images were obtained from the base of the skull through the vertex without intravenous contrast. COMPARISON:  None. FINDINGS: Brain: No evidence of acute territorial infarction, hemorrhage, hydrocephalus,extra-axial collection or mass lesion/mass effect. There is low-attenuation changes in the deep white matter consistent with small vessel ischemia. Ventricles are normal in size and contour. Vascular: No hyperdense vessel or unexpected calcification. Skull: The skull is intact. No fracture or focal lesion identified. Sinuses/Orbits: Small amount of fluid seen within the left maxillary sinus and right sphenoid sinus. The orbits and globes intact. Other: None IMPRESSION: No acute intracranial abnormality. Findings consistent chronic small vessel ischemia Electronically Signed   By: Prudencio Pair M.D.   On: 01/18/2020 22:13   MR BRAIN WO CONTRAST  Result Date: 01/20/2020 CLINICAL DATA:  Mental status change with unknown cause. Multiple recent  falls. EXAM: MRI HEAD WITHOUT CONTRAST TECHNIQUE: Multiplanar, multiecho pulse sequences of the brain and surrounding structures were obtained without intravenous contrast. COMPARISON:  This head CT from 2 days ago FINDINGS: Brain: Trace subdural hemorrhage along the left cerebral convexity, mainly inferiorly, only 2 mm in maximal thickness. A trace right occipital subdural collection could also be present. Head CT follow-up in 24 hours could be considered. No acute infarct, hydrocephalus, or masslike finding. Moderate chronic small vessel ischemia in the periventricular white matter and pons. Vascular: Normal flow voids. Skull and upper cervical spine: Normal marrow signal Sinuses/Orbits: No acute finding.  Bilateral cataract resection Other: These results were called by telephone at the time of interpretation on 01/20/2020 at 12:45 pm to provider [facility] , who verbally acknowledged these results. IMPRESSION: 1. Thin subdural hematoma along the left cerebral convexity (only 2 mm in thickness). This was not seen on head CT from 2 days ago but may have been too thin to detect by CT. No mass effect. 2. Probable even thinner subdural hematoma along the right occipital convexity. 3. No acute infarct. 4. Moderate motion artifact. Electronically Signed   By: Monte Fantasia M.D.   On: 01/20/2020 12:53   DG Chest Port 1 View  Result Date: 01/18/2020 CLINICAL DATA:  Altered mental status, status post fall. EXAM: PORTABLE CHEST 1 VIEW COMPARISON:  December 23, 2019 FINDINGS: Decreased lung volumes are seen which is likely, in part, secondary to the degree of patient inspiration. There is no evidence of acute infiltrate, pleural effusion or pneumothorax. Numerous radiopaque overlying cardiac lead wires are present. The heart size and mediastinal contours are within normal limits. There is marked severity calcification of the aortic arch. No acute osseous abnormalities are identified. IMPRESSION: Low lung volumes without  evidence of acute or active cardiopulmonary disease. Electronically Signed   By: Virgina Norfolk M.D.   On: 01/18/2020 22:18   DG HIPS BILAT WITH PELVIS 3-4 VIEWS  Result Date: 01/18/2020 CLINICAL DATA:  Status post fall. EXAM: DG HIP (WITH OR WITHOUT PELVIS) 3-4V BILAT COMPARISON:  None. FINDINGS: There is no evidence of hip fracture or dislocation. There is no evidence of arthropathy or other focal bone abnormality. IMPRESSION: Negative. Electronically Signed   By: Virgina Norfolk M.D.   On: 01/18/2020 22:16   Scheduled Meds: . atorvastatin  20 mg Oral Daily  . escitalopram  10 mg Oral Daily  . ferrous sulfate  325 mg Oral BID PC  . folic acid  1 mg Oral Daily  . insulin aspart  0-6 Units Subcutaneous TID WC  . metoprolol tartrate  25 mg Oral BID  . pantoprazole  40 mg Oral BID AC  . ropinirole  5 mg Oral QHS   Continuous Infusions: . dextrose 5 % and  0.9% NaCl 50 mL/hr at 01/20/20 0654     LOS: 0 days    Time spent: 35 mins.   Shawna Clamp, MD [facility]   If 7PM-7AM, please contact night-coverage

## 2020-01-20 NOTE — Progress Notes (Addendum)
Family updated on pt overall condition and plan of care.Family did state pt is home alone during the day when family is working. They also note she has has exhibited memory issues at home. They also noted that she does not eat well at home and they have to make her take sips and bites of food. These findings were told to the md.  Pt remains stable though confused and weak overall. No needs at this time.

## 2020-01-20 NOTE — Evaluation (Signed)
Physical Therapy Evaluation Patient Details Name: KOURTLYN CHARLET MRN: [id number] DOB: 1945-05-30 Today's Date: 01/20/2020   History of Present Illness  74 year old with history of DM2, HTN, HLD, CAD, CHF, CKD stage IIIa presents with generalized weakness and multiple falls at home. Pt previously admitted for CHF exacerbation/pneumonia and heme positive stools. Intermittent behavioral disturbances at home.  Upon admission she was febrile, acute kidney injury, hyponatremia, metabolic encephalopathy.  Clinical Impression  Pt admitted as above and presenting with functional mobility limitations 2* generalized weakness, lethargy, unspecified pain and cognitive deficits with pt unable to follow cues.  Patient unable to follow 1 step directions, very minimal communication with therapists uttering 1-2 words intermittently otherwise softly whimpering/moaning and unable to communicate why. Patient total A x2 for bed mobility, did not attempt standing.  Pt would benefit from follow up rehab at SNF level to maximize IND and safety unless significant progress achieved during acute care stay.    Follow Up Recommendations SNF    Equipment Recommendations  None recommended by PT    Recommendations for Other Services       Precautions / Restrictions Precautions Precautions: Fall Restrictions Weight Bearing Restrictions: No      Mobility  Bed Mobility Overal bed mobility: Needs Assistance Bed Mobility: Supine to Sit;Sit to Supine     Supine to sit: Total assist;+2 for physical assistance;+2 for safety/equipment;HOB elevated Sit to supine: Total assist;+2 for physical assistance;+2 for safety/equipment   General bed mobility comments: patient does not initiate to assist with bed mobility, utilized bed pad to mobilize patient to EOB  Transfers                 General transfer comment: deferred 2* safety  Ambulation/Gait                Stairs            Wheelchair  Mobility    Modified Rankin (Stroke Patients Only)       Balance Overall balance assessment: Needs assistance Sitting-balance support: Bilateral upper extremity supported;Feet supported Sitting balance-Leahy Scale: Poor                                       Pertinent Vitals/Pain Pain Assessment: Faces Faces Pain Scale: Hurts a little bit Pain Location: patient unable to provide info, with sitting up EOB patient becames to whimper Pain Descriptors / Indicators: Moaning Pain Intervention(s): Monitored during session    Home Living Family/patient expects to be discharged to:: Private residence Living Arrangements: Spouse/significant other Available Help at Discharge: Available PRN/intermittently Type of Home: House Home Access: Stairs to enter Entrance Stairs-Rails: Right Entrance Stairs-Number of Steps: 10 in front, ramp in the back Home Layout: One level Home Equipment: [place] - 2 wheels;Cane - single point;Toilet riser Additional Comments: Information obtained from previous eval, patient unreliable narrator     Prior Function Level of Independence: Independent with assistive device(s)         Comments: Info obtained from previous eval, unsure how much assistance family has been providing     Hand Dominance   Dominant Hand: Right    Extremity/Trunk Assessment   Upper Extremity Assessment Upper Extremity Assessment: Generalized weakness    Lower Extremity Assessment Lower Extremity Assessment: Defer to PT evaluation       Communication   Communication: Expressive difficulties (very minimal communication with 1-2 mumbled words)  Cognition Arousal/Alertness: Awake/alert Behavior  During Therapy: Anxious Overall Cognitive Status: No family/caregiver present to determine baseline cognitive functioning                                 General Comments: patient not following 1 step commands such as "squeeze my fingers"       General Comments      Exercises     Assessment/Plan    PT Assessment Patient needs continued PT services  PT Problem List Decreased strength;Decreased activity tolerance;Decreased balance;Decreased mobility;Decreased cognition;Decreased knowledge of use of DME;Pain       PT Treatment Interventions DME instruction;Gait training;Stair training;Functional mobility training;Therapeutic activities;Therapeutic exercise;Cognitive remediation;Patient/family education    PT Goals (Current goals can be found in the Care Plan section)  Acute Rehab PT Goals Patient Stated Goal: unable to state PT Goal Formulation: Patient unable to participate in goal setting Time For Goal Achievement: 02/03/20 Potential to Achieve Goals: Fair    Frequency Min 3X/week   Barriers to discharge        Co-evaluation PT/OT/SLP Co-Evaluation/Treatment: Yes Reason for Co-Treatment: Necessary to address cognition/behavior during functional activity;For patient/therapist safety;To address functional/ADL transfers PT goals addressed during session: Mobility/safety with mobility OT goals addressed during session: ADL's and self-care       AM-PAC PT "6 Clicks" Mobility  Outcome Measure Help needed turning from your back to your side while in a flat bed without using bedrails?: Total Help needed moving from lying on your back to sitting on the side of a flat bed without using bedrails?: Total Help needed moving to and from a bed to a chair (including a wheelchair)?: Total Help needed standing up from a chair using your arms (e.g., wheelchair or bedside chair)?: Total Help needed to walk in hospital room?: Total Help needed climbing 3-5 steps with a railing? : Total 6 Click Score: 6    End of Session   Activity Tolerance: Patient limited by lethargy Patient left: in bed;with call bell/phone within reach;with bed alarm set Nurse Communication: Mobility status PT Visit Diagnosis: Difficulty in walking, not  elsewhere classified (R26.2);Muscle weakness (generalized) (M62.81)    Time: 1045-1100 PT Time Calculation (min) (ACUTE ONLY): 15 min   Charges:   PT Evaluation $PT Eval Low Complexity: 1 Low          Debe Coder PT Acute Rehabilitation Services Pager [phone] Office [phone]   Mariyah Upshaw 01/20/2020, 2:59 PM

## 2020-01-20 NOTE — Evaluation (Signed)
Clinical/Bedside Swallow Evaluation Patient Details  Name: Chloe Harding MRN: [id number] Date of Birth: 18-Aug-1945  Today's Date: 01/20/2020 Time: SLP Start Time (ACUTE ONLY): 1450 SLP Stop Time (ACUTE ONLY): 1513 SLP Time Calculation (min) (ACUTE ONLY): 23 min  Past Medical History:  Past Medical History:  Diagnosis Date  . Arthritis   . Depression   . Diabetes mellitus   . Hiatal hernia   . Hypercholesteremia   . Hypertension   . Reflux   . Renal disorder    shutting down 4 years ago   Past Surgical History:  Past Surgical History:  Procedure Laterality Date  . CHOLECYSTECTOMY    . COLONOSCOPY WITH PROPOFOL N/A 05/20/2017   Procedure: COLONOSCOPY WITH PROPOFOL;  Surgeon: Carol Ada, MD;  Location: [place];  Service: Endoscopy;  Laterality: N/A;  . ESOPHAGOGASTRODUODENOSCOPY N/A 05/19/2017   Procedure: ESOPHAGOGASTRODUODENOSCOPY (EGD);  Surgeon: Juanita Craver, MD;  Location: [facility] ENDOSCOPY;  Service: Endoscopy;  Laterality: N/A;  . ESOPHAGOGASTRODUODENOSCOPY (EGD) WITH PROPOFOL N/A 01/17/2019   Procedure: ESOPHAGOGASTRODUODENOSCOPY (EGD) WITH PROPOFOL;  Surgeon: Carol Ada, MD;  Location: [facility] ENDOSCOPY;  Service: Endoscopy;  Laterality: N/A;  . SAVORY DILATION N/A 01/17/2019   Procedure: SAVORY DILATION;  Surgeon: Carol Ada, MD;  Location: [facility] ENDOSCOPY;  Service: Endoscopy;  Laterality: N/A;   HPI:  Chloe Harding is a 74 y.o. female admitted to the hospital after being brought in by her family with progressive confusion and recent falls.  PO intake ahs been poor and there is concern for dehydration with AKI. She has history of diabetes mellitus, congestive heart failure, chronic anemia andhypertension. EGD in September 2020 with finding of 2 benign esophageal strictures.  These required Savary dilation from 8 mm to 12 mm.  She had a severe stricture at 22 cm.  Noted to have a 7 cm hiatal hernia and otherwise negative exam.  At that time serial EGDs with dilations were  recommended. Unclear wheter pt had any f/u procedures. May have chronic esophagitis. Pt had BSE on 11/08/19 that showed appearance of normal swallowing ability.    Assessment / Plan / Recommendation Clinical Impression  Pt presents with a dry oral cavity with peeling skin on her lingual surface. After a few sips this came loose resulting in lingual rolling. No immediate signs of aspiration observed, but pt did have grimacing with swallowing. After several oz of liquids and puree pt exhibited delayed coughing. Suspect related to primary esophageal dysphagia. Pt recommended to initaite a full liquid diet with esophageal precautions. Will f/u for potential diet advancement.  SLP Visit Diagnosis: Dysphagia, unspecified (R13.10)    Aspiration Risk  Moderate aspiration risk;Risk for inadequate nutrition/hydration    Diet Recommendation Thin liquid   Liquid Administration via: Cup;Straw Medication Administration: Crushed with puree Supervision: Staff to assist with self feeding;Full supervision/cueing for compensatory strategies Compensations: Slow rate;Small sips/bites Postural Changes: Seated upright at 90 degrees;Remain upright for at least 30 minutes after po intake    Other  Recommendations Oral Care Recommendations: Oral care BID   Follow up Recommendations Skilled Nursing facility      Frequency and Duration min 2x/week  2 weeks       Prognosis        Swallow Study   General HPI: Chloe Harding is a 74 y.o. female admitted to the hospital after being brought in by her family with progressive confusion and recent falls.  PO intake ahs been poor and there is concern for dehydration with AKI. She has history  of diabetes mellitus, congestive heart failure, chronic anemia andhypertension. EGD in September 2020 with finding of 2 benign esophageal strictures.  These required Savary dilation from 8 mm to 12 mm.  She had a severe stricture at 22 cm.  Noted to have a 7 cm hiatal hernia and  otherwise negative exam.  At that time serial EGDs with dilations were recommended. Unclear wheter pt had any f/u procedures. May have chronic esophagitis. Pt had BSE on 11/08/19 that showed appearance of normal swallowing ability.  Type of Study: Bedside Swallow Evaluation Previous Swallow Assessment: BSE 11/08/19 = reg/thin recommended Diet Prior to this Study: NPO Temperature Spikes Noted: No History of Recent Intubation: No Behavior/Cognition: Alert;Requires cueing Oral Care Completed by SLP: No Oral Cavity - Dentition: Poor condition Vision: Functional for self-feeding Self-Feeding Abilities: Total assist Patient Positioning: Upright in bed Baseline Vocal Quality: Low vocal intensity Volitional Cough: Cognitively unable to elicit Volitional Swallow: Unable to elicit    Oral/Motor/Sensory Function Overall Oral Motor/Sensory Function: Within functional limits   Ice Chips Ice chips: Impaired Presentation: Spoon Oral Phase Impairments: Poor awareness of bolus Oral Phase Functional Implications: Prolonged oral transit   Thin Liquid Thin Liquid: Impaired Pharyngeal  Phase Impairments: Cough - Immediate (grimacing)    Nectar Thick Nectar Thick Liquid: Not tested   Honey Thick Honey Thick Liquid: Not tested   Puree Puree: Within functional limits   Solid     Solid: Not tested     Herbie [place], MA CCC-SLP  Acute Rehabilitation Services Pager [phone] Office [phone]  Lynann Beaver 01/20/2020,3:16 PM

## 2020-01-20 NOTE — Progress Notes (Signed)
Pt has had more of a dry cough now she starting eating and drinking more. It is delayed after eating and drinking. Rn did notify md of this concern. Pt still not wanting to eat or drink often. Rn will continue to monitor.

## 2020-01-20 NOTE — Progress Notes (Addendum)
Pt had several large bowel movements today. Rn concerned that smog enema will be too much for pt to tolerate based on mentation and the fact she has already has several bowel movements. Dr. Fuller Plan paged and rn awaiting call back. Speech therapy in room to see pt. Rn will continue to monitor. Amy Easterwood responded to page and enema to be held for now. They will will readdress in the am with an xray.

## 2020-01-20 NOTE — Plan of Care (Signed)
Plan of care reviewed. 

## 2020-01-21 ENCOUNTER — Inpatient Hospital Stay (HOSPITAL_COMMUNITY): Payer: Medicare Other

## 2020-01-21 LAB — CBC
HCT: 26.1 % — ABNORMAL LOW (ref 36.0–46.0)
Hemoglobin: 8.2 g/dL — ABNORMAL LOW (ref 12.0–15.0)
MCH: 27.9 pg (ref 26.0–34.0)
MCHC: 31.4 g/dL (ref 30.0–36.0)
MCV: 88.8 fL (ref 80.0–100.0)
Platelets: 163 10*3/uL (ref 150–400)
RBC: 2.94 MIL/uL — ABNORMAL LOW (ref 3.87–5.11)
RDW: 16 % — ABNORMAL HIGH (ref 11.5–15.5)
WBC: 5.1 10*3/uL (ref 4.0–10.5)
nRBC: 0 % (ref 0.0–0.2)

## 2020-01-21 LAB — GLUCOSE, CAPILLARY
Glucose-Capillary: 100 mg/dL — ABNORMAL HIGH (ref 70–99)
Glucose-Capillary: 108 mg/dL — ABNORMAL HIGH (ref 70–99)
Glucose-Capillary: 116 mg/dL — ABNORMAL HIGH (ref 70–99)
Glucose-Capillary: 108 mg/dL — ABNORMAL HIGH (ref 70–99)

## 2020-01-21 LAB — HEMOGLOBIN AND HEMATOCRIT, BLOOD
HCT: 24.4 % — ABNORMAL LOW (ref 36.0–46.0)
Hemoglobin: 7.8 g/dL — ABNORMAL LOW (ref 12.0–15.0)

## 2020-01-21 LAB — BASIC METABOLIC PANEL
Anion gap: 8 (ref 5–15)
BUN: 10 mg/dL (ref 8–23)
CO2: 22 mmol/L (ref 22–32)
Calcium: 8.5 mg/dL — ABNORMAL LOW (ref 8.9–10.3)
Chloride: 110 mmol/L (ref 98–111)
Creatinine, Ser: 0.7 mg/dL (ref 0.44–1.00)
GFR calc Af Amer: >60 mL/min (ref >60)
GFR calc non Af Amer: >60 mL/min (ref >60)
Glucose, Bld: 114 mg/dL — ABNORMAL HIGH (ref 70–99)
Potassium: 3.4 mmol/L — ABNORMAL LOW (ref 3.5–5.1)
Sodium: 140 mmol/L (ref 135–145)

## 2020-01-21 LAB — PHOSPHORUS: Phosphorus: 1.8 mg/dL — ABNORMAL LOW (ref 2.5–4.6)

## 2020-01-21 LAB — MAGNESIUM: Magnesium: 1.2 mg/dL — ABNORMAL LOW (ref 1.7–2.4)

## 2020-01-21 MED ORDER — PHENOL 1.4 % MT LIQD
1.0000 | OROMUCOSAL | Status: DC | PRN
  Administered 2020-01-22 – 2020-01-26 (×2): 1 via OROMUCOSAL
  Filled 2020-01-21: qty 177

## 2020-01-21 MED ORDER — POTASSIUM PHOSPHATES 15 MMOLE/5ML IV SOLN
30.0000 mmol | Freq: Once | INTRAVENOUS | Status: AC
  Administered 2020-01-21: 30 mmol via INTRAVENOUS
  Filled 2020-01-21: qty 10

## 2020-01-21 MED ORDER — STERILE WATER FOR INJECTION IJ SOLN
INTRAMUSCULAR | Status: AC
  Administered 2020-01-21: 10 mL
  Filled 2020-01-21: qty 10

## 2020-01-21 MED ORDER — CHLORHEXIDINE GLUCONATE CLOTH 2 % EX PADS
6.0000 | MEDICATED_PAD | Freq: Every day | CUTANEOUS | Status: DC
  Administered 2020-01-21 – 2020-01-26 (×5): 6 via TOPICAL

## 2020-01-21 MED ORDER — MAGNESIUM SULFATE 2 GM/50ML IV SOLN
2.0000 g | Freq: Once | INTRAVENOUS | Status: AC
  Administered 2020-01-21: 2 g via INTRAVENOUS
  Filled 2020-01-21: qty 50

## 2020-01-21 MED ORDER — LEVETIRACETAM 500 MG PO TABS
500.0000 mg | ORAL_TABLET | Freq: Two times a day (BID) | ORAL | Status: DC
  Administered 2020-01-21 – 2020-01-26 (×10): 500 mg via ORAL
  Filled 2020-01-21 (×11): qty 1

## 2020-01-21 NOTE — Progress Notes (Signed)
Bladder scan volume: 405 cc. Just paged Dr. Shawna Clamp to request an order for a foley cath insertion. Patient has had urinary retention for the second time.

## 2020-01-21 NOTE — Plan of Care (Signed)
  Problem: Safety: Goal: Ability to remain free from injury will improve Outcome: Progressing   

## 2020-01-21 NOTE — Progress Notes (Signed)
PROGRESS NOTE    Chloe Harding  TKW:[id number] DOB: [birth year] DOA: 01/18/2020 PCP: [name], NP   Brief Narrative:  This 74 year old female with diabetes mellitus, CHF, chronic anemia, hyperlipidemia, hypertension, recent multiple hospitalizations presents in the ED with confusion over the last 24 hours and had multiple falls.  Daughter reports she fell few days back,  hit head and bruised her head,  she could not recognize family at that time.  She had similar confusion before but it is worse at this time. In the ED CT head unremarkable, but she continued to remain confused. MRI brain shows thin subdural hematoma along the left cerebral convexity (only 2 mm in thickness). This was not seen on head CT from 2 days ago but may have been too thin to detect by CT. No mass effect. No acute infarct.  Neurology consulted recommended MRI, dementia work-up. Her hemoglobin dropped from 12.4-9.6 - 8.2,  stool for occult blood positive.  GI consulted , continue supportive care, monitor H&H and transfuse if hemoglobin below 7.  Patient might not require GI evaluation unless acute encephalopathy resolved.  Admitted 8/7-8/11: altered mental status,  falls at home, unsteady gait with negative CT head /C-spine,  Normal TSH ammonia slightly low vitamin D, heme positive stool, given her endoscopy in 2020 and colonoscopy in 2019 with rectosigmoid polyp resection diverticulosis, discussed with Dr. Benson Norway on last admission and given history of erosive and severe esophagitis, advised PPI on discharge.   Assessment & Plan:   Principal Problem:   Acute encephalopathy Active Problems:   Hypertension   CAD in native artery   AKI (acute kidney injury) ([place])  Acute encephalopathy:  Unclear etiology,  suspecting metabolic, there is no neck stiffness,  No fever, Non focal exam.  TSH,  Ammonia,  B12- normal.  UA:  negative. Continue NPO until more alert,  awake and oriented.  CT abdomen showed impacted stool. Given  laxatives, She had 2 big bowel movements,  dark brown stools,  no frank blood noted.  Has had similar previous admissions. CT head in the ED:  No acute finding.  Discussed with Neurology recommended MRI and other labs.  MRI shows 2 mm small hematomas, radiology recommended repeat CT head in 24 hours.  Continue IV hydration. Keep on fall precautions, PT OT. SLP eval. Repeat CT head no hematoma.  Chronic anemia:  Hb baseline 8 gm or so- hemoconcentration on admission.  Blackish stool-  Stool for occult blood +  Hold lovenox for now and change to SCD- Hemoglobin dropped from 12.4-9.6- 8.2.  GI consulted recommended Protonix bid Monitor H&H.  Transfuse if hemoglobin below 7.  She might not require GI evaluation unless acute encephalopathy resolved.  AKI-  Resolved.  Creatinine improved with IV fluids and continue IV fluids. Hyponatremia-  Resolved. Type 2 diabetes mellitus on sliding scale. Hypertension/HLD. Continue home meds. Chronic systolic CHF and grade 1 diastolic dysfunction.  Monitor closely while on IV fluids. Hypothyroidism on Synthroid. Depression on Lexapro. Urinary retention : Foley catheter Hypomagnesemia: Replacement in progress, Recheck labs in am. Hypophosphatemia:  Replacement in progress.   DVT prophylaxis:SCDs Code Status: Full Family Communication: No one at bed side. Disposition Plan: Consultants:   Gastroenterology, neurology  Procedures:  Antimicrobials:  Anti-infectives (From admission, onward)   Start     Dose/Rate Route Frequency Ordered Stop   01/19/20 1000  hydroxychloroquine (PLAQUENIL) tablet 200 mg  Status:  Discontinued        200 mg Oral Daily 01/19/20 0154 01/19/20  0449      Subjective: Patient was seen and examined at bedside.  Overnight events noted.   She still remains confused, she nods heads when called , she was unable to urinate last night, foley catheter was inserted.  Objective: Vitals:   01/20/20 0900 01/20/20 2143 01/21/20 0500  01/21/20 0611  BP: (!) 153/73 (!) 143/76  133/67  Pulse: 99 97  87  Resp: 17 18  16   Temp: 97.9 F (36.6 C) 99 F (37.2 C)  98 F (36.7 C)  TempSrc: Oral Oral  Oral  SpO2: 95% 100%  100%  Weight:   76.4 kg     Intake/Output Summary (Last 24 hours) at 01/21/2020 1314 Last data filed at 01/21/2020 0945 Gross per 24 hour  Intake 1230.8 ml  Output 1400 ml  Net -169.2 ml   Filed Weights   01/19/20 0500 01/21/20 0500  Weight: 76.3 kg 76.4 kg    Examination:  General exam: Appears calm and comfortable , confused, not following commands. Nods head.Marland Kitchen Respiratory system: Clear to auscultation. Respiratory effort normal. Cardiovascular system: S1 & S2 heard, RRR. No JVD, murmurs, rubs, gallops or clicks. No pedal edema. Gastrointestinal system: Abdomen is nondistended, soft and nontender. No organomegaly or masses felt. Normal bowel sounds heard. Central nervous system: Alert and oriented x 1. No focal neurological deficits. Extremities:  No leg swelling, peripheral pulses present. Skin: No rashes, lesions or ulcers Psychiatry:  Not assesed.    Data Reviewed: I have personally reviewed following labs and imaging studies  CBC: Recent Labs  Lab 01/18/20 2032 01/19/20 0330 01/21/20 0315  WBC 6.7 8.5 5.1  NEUTROABS 2.9  --   --   HGB 12.4 9.6* 8.2*  HCT 37.4 29.6* 26.1*  MCV 84.4 87.3 88.8  PLT 240 184 093   Basic Metabolic Panel: Recent Labs  Lab 01/18/20 2032 01/19/20 0330 01/21/20 0315  NA 129* 134* 140  K 4.2 4.6 3.4*  CL 100 102 110  CO2 23 23 22   GLUCOSE 102* 97 114*  BUN 14 16 10   CREATININE 1.31* 1.08* 0.70  CALCIUM 10.4* 10.2 8.5*  MG  --   --  1.2*  PHOS  --   --  1.8*   GFR: Estimated Creatinine Clearance: 59.4 mL/min (by C-G formula based on SCr of 0.7 mg/dL). Liver Function Tests: No results for input(s): AST, ALT, ALKPHOS, BILITOT, PROT, ALBUMIN in the last 168 hours. No results for input(s): LIPASE, AMYLASE in the last 168 hours. Recent Labs   Lab 01/18/20 2306 01/19/20 0330  AMMONIA <9* 22   Coagulation Profile: No results for input(s): INR, PROTIME in the last 168 hours. Cardiac Enzymes: No results for input(s): CKTOTAL, CKMB, CKMBINDEX, TROPONINI in the last 168 hours. BNP (last 3 results) No results for input(s): PROBNP in the last 8760 hours. HbA1C: No results for input(s): HGBA1C in the last 72 hours. CBG: Recent Labs  Lab 01/20/20 1213 01/20/20 1644 01/20/20 2147 01/21/20 0723 01/21/20 1132  GLUCAP 85 93 83 100* 108*   Lipid Profile: No results for input(s): CHOL, HDL, LDLCALC, TRIG, CHOLHDL, LDLDIRECT in the last 72 hours. Thyroid Function Tests: Recent Labs    01/19/20 0330  TSH 0.702   Anemia Panel: Recent Labs    01/19/20 0330 01/20/20 1122  VITAMINB12 956*  --   FOLATE  --  66.6   Sepsis Labs: Recent Labs  Lab 01/18/20 2032  LATICACIDVEN 0.7    Recent Results (from the past 240 hour(s))  Culture, blood (  Routine X 2) w Reflex to ID Panel     Status: None (Preliminary result)   Collection Time: 01/18/20  8:20 PM   Specimen: BLOOD  Result Value Ref Range Status   Specimen Description   Final    BLOOD LEFT ANTECUBITAL Performed at [facility] [address] [place], [place] [address]    Special Requests   Final    BOTTLES DRAWN AEROBIC AND ANAEROBIC Blood Culture adequate volume Performed at [facility] [address], [place], [place] [address]    Culture   Final    NO GROWTH 2 DAYS Performed at [facility] [address], [place], [place] [address]    Report Status PENDING  Incomplete  SARS Coronavirus 2 by RT PCR (hospital order, performed in [facility] hospital lab) Nasopharyngeal Nasopharyngeal Swab     Status: None   Collection Time: 01/18/20  9:37 PM   Specimen: Nasopharyngeal Swab  Result Value Ref Range Status   SARS Coronavirus 2 NEGATIVE NEGATIVE Final    Comment: (NOTE) SARS-CoV-2 target nucleic acids are NOT  DETECTED.  The SARS-CoV-2 RNA is generally detectable in upper and lower respiratory specimens during the acute phase of infection. The lowest concentration of SARS-CoV-2 viral copies this assay can detect is 250 copies / mL. A negative result does not preclude SARS-CoV-2 infection and should not be used as the sole basis for treatment or other patient management decisions.  A negative result may occur with improper specimen collection / handling, submission of specimen other than nasopharyngeal swab, presence of viral mutation(s) within the areas targeted by this assay, and inadequate number of viral copies (<250 copies / mL). A negative result must be combined with clinical observations, patient history, and epidemiological information.  Fact Sheet for Patients:   StrictlyIdeas.no  Fact Sheet for Healthcare Providers: BankingDealers.co.za  This test is not yet approved or  cleared by the [place] FDA and has been authorized for detection and/or diagnosis of SARS-CoV-2 by FDA under an Emergency Use Authorization (EUA).  This EUA will remain in effect (meaning this test can be used) for the duration of the COVID-19 declaration under Section 564(b)(1) of the Act, 21 U.S.C. section 360bbb-3(b)(1), unless the authorization is terminated or revoked sooner.  Performed at [facility], [place] [address], [place], [place] [address]   Culture, blood (Routine X 2) w Reflex to ID Panel     Status: None (Preliminary result)   Collection Time: 01/19/20  3:30 AM   Specimen: BLOOD  Result Value Ref Range Status   Specimen Description   Final    BLOOD LEFT HAND Performed at [facility] [address], [place], [place] [address]    Special Requests   Final    BOTTLES DRAWN AEROBIC AND ANAEROBIC Blood Culture adequate volume Performed at [facility] [address], [place], [place]  [address]    Culture   Final    NO GROWTH 2 DAYS Performed at [facility] [address] [place], [place] [address]    Report Status PENDING  Incomplete  Culture, Urine     Status: None   Collection Time: 01/19/20 10:36 AM   Specimen: Urine, Clean Catch  Result Value Ref Range Status   Specimen Description   Final    URINE, CLEAN CATCH Performed at [facility], [place] [address], [place], [place] [address]    Special Requests   Final  NONE Performed at [facility], [place] [address] [place], [place] [address]    Culture   Final    NO GROWTH Performed at [facility], [place] [address] [place], [place] [address]    Report Status 01/20/2020 FINAL  Final     Radiology Studies: DG Abd 1 View  Result Date: 01/21/2020 CLINICAL DATA:  Fecal impaction EXAM: ABDOMEN - 1 VIEW COMPARISON:  CT abdomen/pelvis dated 01/19/2020 FINDINGS: Nonobstructive bowel gas pattern. Normal colonic stool burden. However, there is moderate stool in the rectal vault, compatible with the clinical history of fecal impaction. Cholecystectomy clips. Degenerative changes of the lumbar spine. IMPRESSION: Moderate stool in the rectal vault, compatible with the clinical history of fecal impaction. Electronically Signed   By: Julian Hy M.D.   On: 01/21/2020 04:39   CT HEAD WO CONTRAST  Result Date: 01/21/2020 CLINICAL DATA:  Altered mental status. EXAM: CT HEAD WITHOUT CONTRAST TECHNIQUE: Contiguous axial images were obtained from the base of the skull through the vertex without intravenous contrast. COMPARISON:  January 18, 2020. FINDINGS: Brain: Mild diffuse cortical atrophy is noted. Mild chronic ischemic white matter disease is noted. No mass effect or midline shift is noted. Ventricular size is within normal limits. There is no evidence of mass lesion, hemorrhage or acute infarction. Vascular: No hyperdense vessel or unexpected calcification. Skull: Normal.  Negative for fracture or focal lesion. Sinuses/Orbits: No acute finding. Other: None. IMPRESSION: Mild diffuse cortical atrophy. Mild chronic ischemic white matter disease. No acute intracranial abnormality seen. Electronically Signed   By: Marijo Conception M.D.   On: 01/21/2020 09:58   MR BRAIN WO CONTRAST  Result Date: 01/20/2020 CLINICAL DATA:  Mental status change with unknown cause. Multiple recent falls. EXAM: MRI HEAD WITHOUT CONTRAST TECHNIQUE: Multiplanar, multiecho pulse sequences of the brain and surrounding structures were obtained without intravenous contrast. COMPARISON:  This head CT from 2 days ago FINDINGS: Brain: Trace subdural hemorrhage along the left cerebral convexity, mainly inferiorly, only 2 mm in maximal thickness. A trace right occipital subdural collection could also be present. Head CT follow-up in 24 hours could be considered. No acute infarct, hydrocephalus, or masslike finding. Moderate chronic small vessel ischemia in the periventricular white matter and pons. Vascular: Normal flow voids. Skull and upper cervical spine: Normal marrow signal Sinuses/Orbits: No acute finding.  Bilateral cataract resection Other: These results were called by telephone at the time of interpretation on 01/20/2020 at 12:45 pm to provider [facility] , who verbally acknowledged these results. IMPRESSION: 1. Thin subdural hematoma along the left cerebral convexity (only 2 mm in thickness). This was not seen on head CT from 2 days ago but may have been too thin to detect by CT. No mass effect. 2. Probable even thinner subdural hematoma along the right occipital convexity. 3. No acute infarct. 4. Moderate motion artifact. Electronically Signed   By: Monte Fantasia M.D.   On: 01/20/2020 12:53   Scheduled Meds: . atorvastatin  20 mg Oral Daily  . Chlorhexidine Gluconate Cloth  6 each Topical Daily  . escitalopram  10 mg Oral Daily  . ferrous sulfate  325 mg Oral BID PC  . folic acid  1 mg Oral Daily   . insulin aspart  0-6 Units Subcutaneous TID WC  . metoprolol tartrate  25 mg Oral BID  . pantoprazole (PROTONIX) IV  40 mg Intravenous Q24H  . ropinirole  5 mg Oral QHS   Continuous Infusions: . dextrose 5 %  and 0.9% NaCl 50 mL/hr at 01/21/20 1042  . magnesium sulfate bolus IVPB    . potassium PHOSPHATE IVPB (in mmol) 30 mmol (01/21/20 1043)     LOS: 1 day    Time spent: 35 mins.   Shawna Clamp, MD [facility]   If 7PM-7AM, please contact night-coverage

## 2020-01-21 NOTE — Plan of Care (Signed)
  Problem: Clinical Measurements: Goal: Respiratory complications will improve Outcome: Progressing   Problem: Clinical Measurements: Goal: Cardiovascular complication will be avoided Outcome: Progressing   Problem: Pain Managment: Goal: General experience of comfort will improve Outcome: Progressing   Problem: Safety: Goal: Ability to remain free from injury will improve Outcome: Progressing   Problem: Skin Integrity: Goal: Risk for impaired skin integrity will decrease Outcome: Progressing   

## 2020-01-21 NOTE — Consult Note (Signed)
NEUROLOGY CONSULTATION NOTE   Date of service: January 21, 2020 Patient Name: Chloe Harding MRN:  [id number] DOB:  02-01-1946 Reason for consult: "encephalopathy"  History of Present Illness  Chloe Harding is a 74 y.o. female with PMH significant for diabetes mellitus, CHF, chronic anemia, hyperlipidemia, hypertension  who presents with AMS and multiple falls.  Per chart, patient was brought into the ED with altered mental status, reported history significant for multiple falls and poor p.o. intake over the last few days prior to presentation.  She had work-up with CT head with no acute intracranial abnormality, chest x-ray with no acute findings.  Labs with sodium of 129, creatinine was noted to be elevated to 1.31, CBC concerning for hemoconcentration with hemoglobin elevated to 12 g and her baseline being around 8.  She was noted to be moving all extremities, no fever, no neck stiffness, no focal deficit noted.  AMS work-up with TSH, ammonia, B12 normal.  Spoke to daughter over phone. Patient is by herself at home most of the day. They do think that she has not been eating and drinking well. Patient calls family if she has a fall and duaghter usually gets over to help her out. There is also concern for progressive dementia and memory issues over the last few months.Family has been trying their best to feed her and give her food and water but requires a lot of encouragement. Per daughter, she has no prior hx of seizures, it is not uncommon for her to get UTIs. There have been instances where her blood pressure and sugars have been low when checked after the falls. She has sometimes been reporting feeling off balance and lightheaded. She remembers some stuff and doesn't remember other stuff.   ROS   Unable to obtain  Past History   Past Medical History:  Diagnosis Date  . Arthritis   . Depression   . Diabetes mellitus   . Hiatal hernia   . Hypercholesteremia   . Hypertension   .  Reflux   . Renal disorder    shutting down 4 years ago   Past Surgical History:  Procedure Laterality Date  . CHOLECYSTECTOMY    . COLONOSCOPY WITH PROPOFOL N/A 05/20/2017   Procedure: COLONOSCOPY WITH PROPOFOL;  Surgeon: Carol Ada, MD;  Location: [facility];  Service: Endoscopy;  Laterality: N/A;  . ESOPHAGOGASTRODUODENOSCOPY N/A 05/19/2017   Procedure: ESOPHAGOGASTRODUODENOSCOPY (EGD);  Surgeon: Juanita Craver, MD;  Location: [facility] ENDOSCOPY;  Service: Endoscopy;  Laterality: N/A;  . ESOPHAGOGASTRODUODENOSCOPY (EGD) WITH PROPOFOL N/A 01/17/2019   Procedure: ESOPHAGOGASTRODUODENOSCOPY (EGD) WITH PROPOFOL;  Surgeon: Carol Ada, MD;  Location: [facility] ENDOSCOPY;  Service: Endoscopy;  Laterality: N/A;  . SAVORY DILATION N/A 01/17/2019   Procedure: SAVORY DILATION;  Surgeon: Carol Ada, MD;  Location: [facility] ENDOSCOPY;  Service: Endoscopy;  Laterality: N/A;   Family History  Problem Relation Age of Onset  . Heart attack Sister   . Heart failure Sister   . Heart attack Brother   . Heart failure Brother    Social History   Socioeconomic History  . Marital status: Married    Spouse name: Not on file  . Number of children: Not on file  . Years of education: Not on file  . Highest education level: Not on file  Occupational History  . Not on file  Tobacco Use  . Smoking status: Never Smoker  . Smokeless tobacco: Never Used  Vaping Use  . Vaping Use: Never used  Substance and Sexual Activity  .  Alcohol use: No  . Drug use: No  . Sexual activity: Not Currently  Other Topics Concern  . Not on file  Social History Narrative  . Not on file   Social Determinants of Health   Financial Resource Strain:   . Difficulty of Paying Living Expenses: Not on file  Food Insecurity:   . Worried About Charity fundraiser in the Last Year: Not on file  . Ran Out of Food in the Last Year: Not on file  Transportation Needs:   . Lack of Transportation (Medical): Not on file  . Lack of Transportation  (Non-Medical): Not on file  Physical Activity:   . Days of Exercise per Week: Not on file  . Minutes of Exercise per Session: Not on file  Stress:   . Feeling of Stress : Not on file  Social Connections:   . Frequency of Communication with Friends and Family: Not on file  . Frequency of Social Gatherings with Friends and Family: Not on file  . Attends Religious Services: Not on file  . Active Member of Clubs or Organizations: Not on file  . Attends Archivist Meetings: Not on file  . Marital Status: Not on file   Allergies  Allergen Reactions  . Amlodipine Besylate Swelling    swelling    Medications   Medications Prior to Admission  Medication Sig Dispense Refill Last Dose  . amitriptyline (ELAVIL) 25 MG tablet Take 25 mg by mouth at bedtime.   01/17/2020 at Unknown time  . atorvastatin (LIPITOR) 20 MG tablet Take 1 tablet (20 mg total) by mouth daily. 30 tablet 0 01/18/2020 at Unknown time  . bisacodyl (DULCOLAX) 5 MG EC tablet Take 1 tablet (5 mg total) by mouth daily as needed for moderate constipation. 30 tablet 0 Past Week at Unknown time  . CONSTULOSE 10 GM/15ML solution Take 10 g by mouth daily as needed for mild constipation.    Past Week at Unknown time  . cyclobenzaprine (FLEXERIL) 10 MG tablet Take 10 mg by mouth 3 (three) times daily.   01/18/2020 at Unknown time  . escitalopram (LEXAPRO) 10 MG tablet Take 10 mg by mouth daily.   01/18/2020 at Unknown time  . ferrous sulfate 325 (65 FE) MG tablet Take 1 tablet (325 mg total) by mouth 2 (two) times daily with a meal. 60 tablet 0 01/18/2020 at Unknown time  . folic acid (FOLVITE) 1 MG tablet Take 1 mg by mouth daily.   01/18/2020 at Unknown time  . furosemide (LASIX) 40 MG tablet Take 1 tablet (40 mg total) by mouth daily. 30 tablet 0 01/18/2020 at Unknown time  . gabapentin (NEURONTIN) 400 MG capsule Take 400 mg by mouth 3 (three) times daily.   01/18/2020 at Unknown time  . HYDROcodone-acetaminophen (NORCO/VICODIN) 5-325 MG  tablet Take 1 tablet by mouth every 6 (six) hours as needed for moderate pain. 15 tablet 0 01/18/2020 at Unknown time  . hydroxychloroquine (PLAQUENIL) 200 MG tablet Take 200 mg by mouth daily.   01/18/2020 at Unknown time  . JANUVIA 100 MG tablet Take 100 mg by mouth daily.   01/18/2020 at Unknown time  . metoprolol tartrate (LOPRESSOR) 25 MG tablet Take 1 tablet (25 mg total) by mouth 2 (two) times daily. 60 tablet 0 01/17/2020 at Unknown  . pantoprazole (PROTONIX) 40 MG tablet Take 1 tablet (40 mg total) by mouth 2 (two) times daily. 60 tablet 0 01/18/2020 at Unknown time  . polyethylene glycol (MIRALAX /  GLYCOLAX) 17 g packet Take 17 g by mouth daily as needed for moderate constipation or severe constipation. 14 each 0 Past Month at Unknown time  . potassium chloride (KLOR-CON) 10 MEQ tablet Take 10 mEq by mouth daily.   01/18/2020 at Unknown time  . ropinirole (REQUIP) 5 MG tablet Take 5 mg by mouth at bedtime.   01/17/2020 at Unknown time  . senna-docusate (SENOKOT-S) 8.6-50 MG tablet Take 1 tablet by mouth at bedtime. 30 tablet 0 01/17/2020  . Vitamin D3 (VITAMIN D) 25 MCG tablet Take 1 tablet (1,000 Units total) by mouth daily. 60 tablet 0 01/18/2020 at Unknown time     Vitals  Temp:  [98 F (36.7 C)-99 F (37.2 C)] 98.6 F (37 C) (09/05 1334) Pulse Rate:  [83-97] 83 (09/05 1334) Resp:  [16-19] 19 (09/05 1334) BP: (133-143)/(67-76) 140/74 (09/05 1334) SpO2:  [99 %-100 %] 99 % (09/05 1334) Weight:  [76.4 kg] 76.4 kg (09/05 0500)  Body mass index is 31.82 kg/m.  Physical Exam   General: Laying comfortably in bed; in no acute distress.  HENT: Normal oropharynx and mucosa. Normal external appearance of ears and nose. Neck: Supple, no pain or tenderness CV: No JVD. No peripheral edema. Pulmonary: Symmetric Chest rise. Normal respiratory effort. Abdomen: Soft to touch, non-tender Ext: No cyanosis, edema, or deformity  Skin: No rash. Normal palpation of skin.   Musculoskeletal: Normal digits and  nails by inspection. No clubbing.  Neurologic Examination  Mental status/Cognition: Alert, tracks my face across the room. Appears angry at me and staff after staff attempted an IV line. Speech: She did state her name for me and does seem to follow basic commands.   CN II Pupils equal and reactive to light, no VF deficits    CN III,IV,VI EOM intact, no gaze preference or deviation, no nystagmus   CN V Regards touch all over the face.   CN VII no asymmetry, no nasolabial fold flattening   CN VIII normal hearing to speech   CN IX & X    CN XI    CN XII midline tongue   Motor:  Muscle bulk: normal, tone normal Poor participation in exam precludes me from doing full strength testing.  Reflexes:  Right Left Comments  Pectoralis      Biceps (C5/6) 1 1   Brachioradialis (C5/6) 1 1    Triceps (C6/7) 1 1    Patellar (L3/4) 1 1    Achilles (S1)      Hoffman      Plantar     Jaw jerk    Sensation:  Light touch Localizes to touch and pressure in all extremities.   Pin prick    Temperature    Vibration   Proprioception    Labs   Lab Results  Component Value Date   NA 140 01/21/2020   K 3.4 (L) 01/21/2020   CL 110 01/21/2020   CO2 22 01/21/2020   GLUCOSE 114 (H) 01/21/2020   BUN 10 01/21/2020   CREATININE 0.70 01/21/2020   CALCIUM 8.5 (L) 01/21/2020   ALBUMIN 2.8 (L) 12/27/2019   AST 19 12/27/2019   ALT 10 12/27/2019   ALKPHOS 54 12/27/2019   BILITOT 0.4 12/27/2019   GFRNONAA >60 01/21/2020   GFRAA >60 01/21/2020     Imaging and Diagnostic studies  CTH w/o contrast: Mild diffuse cortical atrophy. Mild chronic ischemic white matter disease. No acute intracranial abnormality seen.  MRI Brain without contrast: 1. Thin subdural hematoma along  the left cerebral convexity (only 2 mm in thickness). This was not seen on head CT from 2 days ago but may have been too thin to detect by CT. No mass effect. 2. Probable even thinner subdural hematoma along the right  occipital convexity. 3. No acute infarct. 4. Moderate motion artifact.  Impression   Chloe Harding is a 74 y.o. female with PMH significant for diabetes mellitus, CHF, chronic anemia, hyperlipidemia, hypertension  who presents with AMS and multiple falls. Her neurologic examination is notable for alert, upset at me but she does track me across the room and did state her name with no focal deficit and does squeeze my hands. Her presentation looks more consistent with delirium rather than seizures. Staff have not seen any seizures, she does not have prior hx. Even if she had a seizure, this would be a provoked seizure in the setting of UTI and AKI and she will still not be a candidate for maintenance AEDs.However, she does have a new subdural hematoma on the MRI and appears to likely be related to her falls. I will order Keppra 500mg  BID for 1 week as a prophylactic in the setting of the noted small SDH. She does not need to on it long term until either routine EEG suggest epileptogenicity or she has an unprovoked seizure.  Recommendations  - I ordered Keppra BID x 7 days asa prophylactic in the setting of small new SDH - Agree with repeat imaging in 24 hours to ensure no worsening hemorrhage - Recommend routine EEG when able. If there is epileptogenicity, can consider maintenance Keppra at that time. ______________________________________________________________________   Thank you for the opportunity to take part in the care of this patient. If you have any further questions, please contact the neurology consultation attending.  Signed,  [place] Pager Number [phone]

## 2020-01-22 ENCOUNTER — Inpatient Hospital Stay (HOSPITAL_COMMUNITY)
Admit: 2020-01-22 | Discharge: 2020-01-22 | Disposition: A | Discharge status: Home / Self Care | Payer: Medicare Other | Attending: Neurology | Admitting: Neurology

## 2020-01-22 ENCOUNTER — Other Ambulatory Visit: Payer: Self-pay

## 2020-01-22 DIAGNOSIS — R569 Unspecified convulsions: Secondary | ICD-10-CM

## 2020-01-22 DIAGNOSIS — I2510 Atherosclerotic heart disease of native coronary artery without angina pectoris: Secondary | ICD-10-CM

## 2020-01-22 DIAGNOSIS — G9340 Encephalopathy, unspecified: Secondary | ICD-10-CM

## 2020-01-22 DIAGNOSIS — I159 Secondary hypertension, unspecified: Secondary | ICD-10-CM

## 2020-01-22 LAB — GLUCOSE, CAPILLARY
Glucose-Capillary: 99 mg/dL (ref 70–99)
Glucose-Capillary: 85 mg/dL (ref 70–99)
Glucose-Capillary: 86 mg/dL (ref 70–99)
Glucose-Capillary: 97 mg/dL (ref 70–99)

## 2020-01-22 LAB — CBC
HCT: 25.5 % — ABNORMAL LOW (ref 36.0–46.0)
Hemoglobin: 8.1 g/dL — ABNORMAL LOW (ref 12.0–15.0)
MCH: 28.2 pg (ref 26.0–34.0)
MCHC: 31.8 g/dL (ref 30.0–36.0)
MCV: 88.9 fL (ref 80.0–100.0)
Platelets: 138 10*3/uL — ABNORMAL LOW (ref 150–400)
RBC: 2.87 MIL/uL — ABNORMAL LOW (ref 3.87–5.11)
RDW: 16 % — ABNORMAL HIGH (ref 11.5–15.5)
WBC: 4.2 10*3/uL (ref 4.0–10.5)
nRBC: 0 % (ref 0.0–0.2)

## 2020-01-22 LAB — BASIC METABOLIC PANEL
Anion gap: 5 (ref 5–15)
BUN: 7 mg/dL — ABNORMAL LOW (ref 8–23)
CO2: 23 mmol/L (ref 22–32)
Calcium: 7.9 mg/dL — ABNORMAL LOW (ref 8.9–10.3)
Chloride: 111 mmol/L (ref 98–111)
Creatinine, Ser: 0.6 mg/dL (ref 0.44–1.00)
GFR calc Af Amer: >60 mL/min (ref >60)
GFR calc non Af Amer: >60 mL/min (ref >60)
Glucose, Bld: 106 mg/dL — ABNORMAL HIGH (ref 70–99)
Potassium: 3.8 mmol/L (ref 3.5–5.1)
Sodium: 139 mmol/L (ref 135–145)

## 2020-01-22 LAB — MAGNESIUM: Magnesium: 1.8 mg/dL (ref 1.7–2.4)

## 2020-01-22 LAB — PHOSPHORUS: Phosphorus: 2.6 mg/dL (ref 2.5–4.6)

## 2020-01-22 MED ORDER — PANTOPRAZOLE SODIUM 40 MG PO TBEC
40.0000 mg | DELAYED_RELEASE_TABLET | Freq: Every day | ORAL | Status: DC
  Administered 2020-01-22 – 2020-01-25 (×4): 40 mg via ORAL
  Filled 2020-01-22 (×4): qty 1

## 2020-01-22 NOTE — Progress Notes (Signed)
PROGRESS NOTE  Chloe Harding  XNA:[id number] DOB: 1945-05-31 DOA: 01/18/2020 PCP: [name], NP   Brief Narrative: Chloe Harding is a 74 y.o. female with a history of T2DM, CHF, chronic anemia, hyperlipidemia, hypertension, recent multiple hospitalizations under palliative care at home who presented 9/2 with confusion and multiple falls. In the ED CT was unremarkable, though subsequent MRI revealed a 42mm subdural hematoma along the left cerebral convexity without mass effect or infarct. Neurology consulted recommended prophylactic keppra, EEG which showed no seizures. After admission, hgb was noted to drop from 12.4g/dl >> 7.6. GI consulted and recommended supportive care. Transfusion has not yet been required.   Admitted 8/7-8/11: altered mental status,  falls at home, unsteady gait with negative CT head /C-spine,  Normal TSH ammonia slightly low vitamin D,heme positive stool, given her endoscopy in 2020 and colonoscopy in 2019 with rectosigmoid polyp resection diverticulosis, discussed with Dr. Benson Norway on last admission and givenhistory of erosive and severe esophagitis, advised PPI on discharge.  Assessment & Plan: Principal Problem:   Acute encephalopathy Active Problems:   Hypertension   CAD in native artery   AKI (acute kidney injury) ([place])  Acute encephalopathy:  Unclear etiology,  suspecting metabolic, there is no neck stiffness,  No fever, Non focal exam. TSH,  Ammonia,  B12-normal. UA:  negative.  - Delirium precautions.   Constipation: CT abdomen showed impacted stool. Given laxatives, She had 2 large nonbloody BMs.   Traumatic subdural hematoma: MRI showed 2 mm small hematoma and probable even smaller SDH along right occiput. Repeat CT head 9/5 showed no hematoma.  - Continue prophylactic keppra x7 days (started 9/5 PM). Discussed with neurology who reports no seizures picked up by EEG.  - Seizure precautions. - Avoid anticoagulation.  - Fall precautions. Pursuing SNF  placement.   Anemia of chronic disease: With hgb drop due to hemoconcentration on admission. Hgb back down near baseline of 8g/dl in the absence of gross bleeding. +FOBT though no gross bleeding noted.  - Avoid anticoagulation/antiplatelets.  - Continue PPI BID - Trend CBC. - GI recommendations appreciated.  AKI-  Resolved.  Creatinine improved with IV fluids which will be continued Hyponatremia:  Resolved. Type 2 diabetes mellitus on sliding scale. Hypertension/HLD. Continue home meds. Chronic systolic CHF and grade 1 diastolic dysfunction.  Monitor closely while on IV fluids. Hypothyroidism on Synthroid. Depression on Lexapro. Urinary retention: Foley catheter Hypomagnesemia: Replacement in progress, Recheck labs in am. Hypophosphatemia:  Replacement in progress.  DVT prophylaxis: SCDs Code Status: Full Family Communication: None at bedside Disposition Plan:  Status is: Inpatient  Remains inpatient appropriate because:Altered mental status  Dispo: The patient is from: Home              Anticipated d/c is to: SNF              Anticipated d/c date is: 1 day              Patient currently is not medically stable to d/c.  Consultants:   Neurology  GI  Procedures:   EEG 01/22/2020: ABNORMALITY -Continuous slow, generalized -Periodic epileptiform discharges with triphasic morphology , generalized  IMPRESSION: This study is suggestive of moderate to severe diffuse encephalopathy, nonspecific etiology but likely related to toxic-metabolic etiology. 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  No seizures were seen throughout the recording.  Antimicrobials:  None   Subjective: Confused,  not cooperative with exam. Denies any pain anywhere, doesn't know where she is.   Objective: Vitals:   01/21/20 2124 01/22/20 0625 01/22/20 0706  01/22/20 1330  BP: 137/64 (!) 146/77  (!) 149/83  Pulse: 87 86  86  Resp: 18 20  (!) 24  Temp: 98.2 F (36.8 C) 97.8 F (36.6 C)  99 F (37.2 C)  TempSrc: Oral Oral  Oral  SpO2: 99% 99%  99%  Weight:      Height:   5\' 4"  (1.626 m)     Intake/Output Summary (Last 24 hours) at 01/22/2020 1642 Last data filed at 01/22/2020 1400 Gross per 24 hour  Intake 991.24 ml  Output 900 ml  Net 91.24 ml   Filed Weights   01/19/20 0500 01/21/20 0500  Weight: 76.3 kg 76.4 kg    Gen: Frail older female in no distress Pulm: Non-labored breathing. Clear to auscultation bilaterally.  CV: Regular rate and rhythm. No murmur, rub, or gallop. No JVD, no pedal edema. GI: Abdomen soft, non-tender, non-distended, with normoactive bowel sounds. No organomegaly or masses felt. Ext: Warm, no deformities Skin: No rashes, lesions or ulcers Neuro: Alert and disoriented. Not cooperative with full exam, though no focal deficits are apparent. Psych: Judgement and insight appear impaired. Calm   Data Reviewed: I have personally reviewed following labs and imaging studies  CBC: Recent Labs  Lab 01/18/20 2032 01/19/20 0330 01/21/20 0315 01/21/20 1433 01/22/20 0410  WBC 6.7 8.5 5.1  --  4.2  NEUTROABS 2.9  --   --   --   --   HGB 12.4 9.6* 8.2* 7.8* 8.1*  HCT 37.4 29.6* 26.1* 24.4* 25.5*  MCV 84.4 87.3 88.8  --  88.9  PLT 240 184 163  --  510*   Basic Metabolic Panel: Recent Labs  Lab 01/18/20 2032 01/19/20 0330 01/21/20 0315 01/22/20 0410  NA 129* 134* 140 139  K 4.2 4.6 3.4* 3.8  CL 100 102 110 111  CO2 23 23 22 23   GLUCOSE 102* 97 114* 106*  BUN 14 16 10  7*  CREATININE 1.31* 1.08* 0.70 0.60  CALCIUM 10.4* 10.2 8.5* 7.9*  MG  --   --  1.2* 1.8  PHOS  --   --  1.8* 2.6   GFR: Estimated Creatinine Clearance: 63.6 mL/min (by C-G formula based on SCr of 0.6 mg/dL). Liver Function Tests: No results for input(s): AST, ALT, ALKPHOS, BILITOT, PROT, ALBUMIN in the last 168 hours. No results for  input(s): LIPASE, AMYLASE in the last 168 hours. Recent Labs  Lab 01/18/20 2306 01/19/20 0330  AMMONIA <9* 22   Coagulation Profile: No results for input(s): INR, PROTIME in the last 168 hours. Cardiac Enzymes: No results for input(s): CKTOTAL, CKMB, CKMBINDEX, TROPONINI in the last 168 hours. BNP (last 3 results) No results for input(s): PROBNP in the last 8760 hours. HbA1C: No results for input(s): HGBA1C in the last 72 hours. CBG: Recent Labs  Lab 01/21/20 1132 01/21/20 1708 01/21/20 2129 01/22/20 0730 01/22/20 1144  GLUCAP 108* 116* 108* 99 85   Lipid Profile: No results for input(s): CHOL, HDL, LDLCALC, TRIG, CHOLHDL, LDLDIRECT in the last 72 hours. Thyroid Function Tests: No results for input(s): TSH, T4TOTAL, FREET4, T3FREE, THYROIDAB in the last 72 hours. Anemia Panel: Recent Labs    01/20/20 1122  FOLATE 66.6   Urine analysis:    Component Value Date/Time   COLORURINE YELLOW 01/19/2020 1030   APPEARANCEUR CLEAR 01/19/2020 1030   LABSPEC 1.010 01/19/2020  1030   PHURINE 6.0 01/19/2020 1030   GLUCOSEU NEGATIVE 01/19/2020 1030   HGBUR NEGATIVE 01/19/2020 1030   HGBUR trace-intact 05/14/2010 1402   BILIRUBINUR NEGATIVE 01/19/2020 1030   KETONESUR NEGATIVE 01/19/2020 1030   PROTEINUR NEGATIVE 01/19/2020 1030   UROBILINOGEN 1.0 03/30/2015 1540   NITRITE NEGATIVE 01/19/2020 1030   LEUKOCYTESUR NEGATIVE 01/19/2020 1030   Recent Results (from the past 240 hour(s))  Culture, blood (Routine X 2) w Reflex to ID Panel     Status: None (Preliminary result)   Collection Time: 01/18/20  8:20 PM   Specimen: BLOOD  Result Value Ref Range Status   Specimen Description   Final    BLOOD LEFT ANTECUBITAL Performed at [facility], [place] [address], [place], [place] [address]    Special Requests   Final    BOTTLES DRAWN AEROBIC AND ANAEROBIC Blood Culture adequate volume Performed at [place] [address] [place], [place]  [address]    Culture   Final    NO GROWTH 3 DAYS Performed at [facility], [place] [address], [place], [place] [address]    Report Status PENDING  Incomplete  SARS Coronavirus 2 by RT PCR (hospital order, performed in [facility] hospital lab) Nasopharyngeal Nasopharyngeal Swab     Status: None   Collection Time: 01/18/20  9:37 PM   Specimen: Nasopharyngeal Swab  Result Value Ref Range Status   SARS Coronavirus 2 NEGATIVE NEGATIVE Final    Comment: (NOTE) SARS-CoV-2 target nucleic acids are NOT DETECTED.  The SARS-CoV-2 RNA is generally detectable in upper and lower respiratory specimens during the acute phase of infection. The lowest concentration of SARS-CoV-2 viral copies this assay can detect is 250 copies / mL. A negative result does not preclude SARS-CoV-2 infection and should not be used as the sole basis for treatment or other patient management decisions.  A negative result may occur with improper specimen collection / handling, submission of specimen other than nasopharyngeal swab, presence of viral mutation(s) within the areas targeted by this assay, and inadequate number of viral copies (<250 copies / mL). A negative result must be combined with clinical observations, patient history, and epidemiological information.  Fact Sheet for Patients:   StrictlyIdeas.no  Fact Sheet for Healthcare Providers: BankingDealers.co.za  This test is not yet approved or  cleared by the [place] FDA and has been authorized for detection and/or diagnosis of SARS-CoV-2 by FDA under an Emergency Use Authorization (EUA).  This EUA will remain in effect (meaning this test can be used) for the duration of the COVID-19 declaration under Section 564(b)(1) of the Act, 21 U.S.C. section 360bbb-3(b)(1), unless the authorization is terminated or revoked sooner.  Performed at [facility], [place] [address], [place], [place]  [address]   Culture, blood (Routine X 2) w Reflex to ID Panel     Status: None (Preliminary result)   Collection Time: 01/19/20  3:30 AM   Specimen: BLOOD  Result Value Ref Range Status   Specimen Description   Final    BLOOD LEFT HAND Performed at [place] [address], [place], [place] [address]    Special Requests   Final    BOTTLES DRAWN AEROBIC AND ANAEROBIC Blood Culture adequate volume Performed at [facility] [address], [place], [place] [address]    Culture   Final    NO GROWTH 3 DAYS Performed at [facility], [place] [address]  [place], [place] [address]    Report Status PENDING  Incomplete  Culture, Urine     Status: None   Collection Time: 01/19/20 10:36 AM   Specimen: Urine, Clean Catch  Result Value Ref Range Status   Specimen Description   Final    URINE, CLEAN CATCH Performed at [facility], [place] [address], [place], [place] [address]    Special Requests   Final    NONE Performed at [facility], [place] [address], [place], [place] [address]    Culture   Final    NO GROWTH Performed at [facility], [place] [address], [place], [place] [address]    Report Status 01/20/2020 FINAL  Final      Radiology Studies: DG Abd 1 View  Result Date: 01/21/2020 CLINICAL DATA:  Fecal impaction EXAM: ABDOMEN - 1 VIEW COMPARISON:  CT abdomen/pelvis dated 01/19/2020 FINDINGS: Nonobstructive bowel gas pattern. Normal colonic stool burden. However, there is moderate stool in the rectal vault, compatible with the clinical history of fecal impaction. Cholecystectomy clips. Degenerative changes of the lumbar spine. IMPRESSION: Moderate stool in the rectal vault, compatible with the clinical history of fecal impaction. Electronically Signed   By: Julian Hy M.D.   On: 01/21/2020 04:39   CT HEAD WO CONTRAST  Result Date: 01/21/2020 CLINICAL DATA:  Altered mental status. EXAM: CT HEAD  WITHOUT CONTRAST TECHNIQUE: Contiguous axial images were obtained from the base of the skull through the vertex without intravenous contrast. COMPARISON:  January 18, 2020. FINDINGS: Brain: Mild diffuse cortical atrophy is noted. Mild chronic ischemic white matter disease is noted. No mass effect or midline shift is noted. Ventricular size is within normal limits. There is no evidence of mass lesion, hemorrhage or acute infarction. Vascular: No hyperdense vessel or unexpected calcification. Skull: Normal. Negative for fracture or focal lesion. Sinuses/Orbits: No acute finding. Other: None. IMPRESSION: Mild diffuse cortical atrophy. Mild chronic ischemic white matter disease. No acute intracranial abnormality seen. Electronically Signed   By: Marijo Conception M.D.   On: 01/21/2020 09:58   EEG adult  Result Date: 01/22/2020 Chloe Havens, MD     01/22/2020  3:59 PM Patient Name: Chloe Harding MRN: [id number] Epilepsy Attending: Lora Harding Referring Physician/Provider: Dr Donnetta Simpers Date: 01/22/2020 Duration: 24.45 mins Patient history: 74 y.o. female with PMH significant for diabetes mellitus, CHF, chronic anemia, hyperlipidemia, hypertension  who presents with AMS and multiple falls. CTH showed no acute intracranial abnormality. MR Brain showed trace subdural hemorrhage along the left cerebral convexity, mainly inferiorly, only 2 mm in maximal thickness. A trace right occipital subdural collection could also be present. EEG to evaluate for seizure.  Level of alertness: Awake AEDs during EEG study: LEV Technical aspects: This EEG study was done with scalp electrodes positioned according to the 10-20 International system of electrode placement. Electrical activity was acquired at a sampling rate of 500Hz  and reviewed with a high frequency filter of 70Hz  and a low frequency filter of 1Hz . EEG data were recorded continuously and digitally stored. Description: No posterior dominant rhythm was seen. EEG  showed continuous generalized 3 to 6 Hz theta-delta slowing. 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 Hyperventilation and photic stimulation were not performed.   ABNORMALITY -Continuous slow, generalized -Periodic epileptiform discharges with triphasic morphology , generalized IMPRESSION: This study is suggestive of moderate to severe diffuse encephalopathy, nonspecific etiology but likely related to toxic-metabolic etiology. Periodic epileptiform discharges with triphasic  morphology were also noted at 1hz  which in the absence of any other epileptiform discharges has low likelihood of epileptogenicity.  No seizures were seen throughout the recording. Chloe Harding    Scheduled Meds: . atorvastatin  20 mg Oral Daily  . Chlorhexidine Gluconate Cloth  6 each Topical Daily  . escitalopram  10 mg Oral Daily  . ferrous sulfate  325 mg Oral BID PC  . folic acid  1 mg Oral Daily  . insulin aspart  0-6 Units Subcutaneous TID WC  . levETIRAcetam  500 mg Oral BID  . metoprolol tartrate  25 mg Oral BID  . pantoprazole  40 mg Oral Daily  . ropinirole  5 mg Oral QHS   Continuous Infusions: . dextrose 5 % and 0.9% NaCl 50 mL/hr at 01/22/20 1400     LOS: 2 days   Time spent: 25 minutes.  Chloe Pour, MD [facility] [email or web address] 01/22/2020, 4:42 PM

## 2020-01-22 NOTE — Progress Notes (Signed)
  Speech Language Pathology Treatment: Dysphagia  Patient Details Name: Chloe Harding MRN: [id number] DOB: Jun 01, 1945 Today's Date: 01/22/2020 Time: 8416-6063 SLP Time Calculation (min) (ACUTE ONLY): 15 min  Assessment / Plan / Recommendation Clinical Impression  Pt informed and nurse technician arrived to room to take blood sugar. Nurse technician reports pt with wincing with intake of cold liquids but not with pudding.  Today she consumed water after SLP handed her the cup.  Swallow appeared timely with no indications of aspiration however pt continues to wince with swallowing even with room temperature water. Recommend provide items for her that are more comfortable for her to swallow - eg warm, room temp, etc as with h/o esophagitis, cold may cause increased odynophagia.. Pt did not answer if she had pain with swallowing water via yes/no question.  Hiccuping and belching observed after intake of approximately 2 ounces of water with pt again wincing.  With known h/o esophagitis, ? if pt may be experiencing this currently - Advised RN to concerns and recommendation to continue full liquid diet.  Note GI is following therefore hope they can provide some assistance KZ:[id number] odynophagia.  Note pt is on a PPI - Could she benefit from something else to decrease possible odynophagia.  Intake has been poor per notes and pt had bowel movements 9/3 after disimpaction, 9/4 and 9/5 but not today but her intake is also poor.  Note she is following up with pallaitive care and per RN, pt apparently had poor intake at times PTA.   Recommend she continue full liquids *note stool burden seen on 9/8 with h/o fecal impaction* with strict precautions.  Hopeful for pt to have dietary advancement with improved mentation/resolution of encephalopathy.    HPI HPI: Chloe Harding is a 74 y.o. female admitted to the hospital after being brought in by her family with progressive confusion and recent falls.  PO intake ahs been  poor and there is concern for dehydration with AKI. She has history of diabetes mellitus, congestive heart failure, chronic anemia andhypertension. EGD in September 2020 with finding of 2 benign esophageal strictures.  These required Savary dilation from 8 mm to 12 mm.  She had a severe stricture at 22 cm.  Noted to have a 7 cm hiatal hernia and otherwise negative exam.  At that time serial EGDs with dilations were recommended. Unclear wheter pt had any f/u procedures. May have chronic esophagitis. Pt had BSE on 11/08/19 that showed appearance of normal swallowing ability.   Pt has been diagnosed with metabolic encephalopathy and intake is poor.  She is s/p EEG negative for seizures, + for toxic encephalopathy.  Per notes, pt with some wheezing earlier today - but now appears comfortable.  Follow up indicated after BSE on Saturday 01/20/2020.      SLP Plan          Recommendations  Diet recommendations: Other(comment);Thin liquid (full liquids, ? advance with improved mentation?) Medication Administration: Crushed with puree Compensations: Slow rate;Small sips/bites                Oral Care Recommendations: Oral care BID Follow up Recommendations: Skilled Nursing facility SLP Visit Diagnosis: Dysphagia, unspecified (R13.10)       Waves, Jahquez Steffler Ann 01/22/2020, 6:00 PM  Kathleen Lime, MS [place] Office [phone]

## 2020-01-22 NOTE — Progress Notes (Signed)
EEG complete - results pending 

## 2020-01-22 NOTE — Progress Notes (Signed)
Asked to follow-up on EEG by Dr. Lorrin Goodell. EEG negative for seizure, consistent with toxic metabolic encephalopathy.  Recommendations: -Seizure precautions -Keppra twice daily for 7 days and then discontinue unless she has seizure episodes. -Please call neurology with questions.  -- Amie Portland, MD [facility] Pager: [phone] If 7pm to 7am, please call on call as listed on AMION.

## 2020-01-22 NOTE — Progress Notes (Signed)
The patient is receiving Protonix by the intravenous route.  Based on criteria approved by the Pharmacy and [facility], the medication is being converted to the equivalent oral dose form.  These criteria include: -No active GI bleeding -Able to tolerate diet of full liquids (or better) or tube feeding -Able to tolerate other medications by the oral or enteral route  If you have any questions about this conversion, please contact the Pharmacy Department (phone [phone]).  Thank you.  Minda Ditto PharmD Pager [phone] 01/22/2020, 1:44 PM

## 2020-01-22 NOTE — Plan of Care (Signed)
  Problem: Pain Managment: Goal: General experience of comfort will improve Outcome: Progressing   Problem: Safety: Goal: Ability to remain free from injury will improve Outcome: Progressing   Problem: Skin Integrity: Goal: Risk for impaired skin integrity will decrease Outcome: Progressing   

## 2020-01-22 NOTE — Procedures (Signed)
Patient Name: Chloe Harding  MRN: [id number]  Epilepsy Attending: Lora Havens  Referring Physician/Provider: Dr Donnetta Simpers Date: 01/22/2020 Duration: 24.45 mins  Patient history: 74 y.o. female with PMH significant for diabetes mellitus, CHF, chronic anemia, hyperlipidemia, hypertension  who presents with AMS and multiple falls. CTH showed no acute intracranial abnormality. MR Brain showed trace subdural hemorrhage along the left cerebral convexity, mainly inferiorly, only 2 mm in maximal thickness. A trace right occipital subdural collection could also be present. EEG to evaluate for seizure.   Level of alertness: Awake  AEDs during EEG study: LEV  Technical aspects: This EEG study was done with scalp electrodes positioned according to the 10-20 International system of electrode placement. Electrical activity was acquired at a sampling rate of 500Hz  and reviewed with a high frequency filter of 70Hz  and a low frequency filter of 1Hz . EEG data were recorded continuously and digitally stored.   Description: No posterior dominant rhythm was seen. EEG showed continuous generalized 3 to 6 Hz theta-delta slowing. 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 Hyperventilation and photic stimulation were not performed.     ABNORMALITY -Continuous slow, generalized -Periodic epileptiform discharges with triphasic morphology , generalized   IMPRESSION: This study is suggestive of moderate to severe diffuse encephalopathy, nonspecific etiology but likely related to toxic-metabolic etiology. 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  No seizures were seen throughout the recording.  [name]

## 2020-01-22 NOTE — Progress Notes (Signed)
[employer]ufacturing [employer] [facility]) Community Based Palliative Care  This patient is enrolled in our palliative care services in the community. [facility] will continue to follow for any discharge planning needs and to coordinate continuation of palliative care. If you have questions or need assistance, please call [phone] or contact the hospital Liaison listed on AMION.  Thank you for the opportunity to participate in this patient's care.  Farrel Gordon, RN, CCM  [facility] Liaison (listed on [place] [facility])    [phone]

## 2020-01-22 NOTE — Progress Notes (Signed)
Patient experiencing frequent dry hacking cough. Exp wheezes w/ dim bases noted upon auscultation. Administered throat spray. Assisted w/ hygiene an positioned patient w/ hob below 30 degrees. Patient appears more comfortable, no longer coughing, wheezing. Will cont to monitor.

## 2020-01-23 LAB — GLUCOSE, CAPILLARY
Glucose-Capillary: 83 mg/dL (ref 70–99)
Glucose-Capillary: 96 mg/dL (ref 70–99)
Glucose-Capillary: 121 mg/dL — ABNORMAL HIGH (ref 70–99)
Glucose-Capillary: 88 mg/dL (ref 70–99)

## 2020-01-23 LAB — CBC
HCT: 26.5 % — ABNORMAL LOW (ref 36.0–46.0)
Hemoglobin: 8.5 g/dL — ABNORMAL LOW (ref 12.0–15.0)
MCH: 27.9 pg (ref 26.0–34.0)
MCHC: 32.1 g/dL (ref 30.0–36.0)
MCV: 86.9 fL (ref 80.0–100.0)
Platelets: 200 10*3/uL (ref 150–400)
RBC: 3.05 MIL/uL — ABNORMAL LOW (ref 3.87–5.11)
RDW: 15.7 % — ABNORMAL HIGH (ref 11.5–15.5)
WBC: 5.3 10*3/uL (ref 4.0–10.5)
nRBC: 0 % (ref 0.0–0.2)

## 2020-01-23 MED ORDER — ONDANSETRON HCL 4 MG/2ML IJ SOLN
4.0000 mg | Freq: Four times a day (QID) | INTRAMUSCULAR | Status: DC | PRN
  Administered 2020-01-23 – 2020-01-26 (×2): 4 mg via INTRAVENOUS
  Filled 2020-01-23 (×2): qty 2

## 2020-01-23 MED ORDER — ONDANSETRON HCL 4 MG/2ML IJ SOLN: 4.0000 mg | Freq: Four times a day (QID) | INTRAMUSCULAR | Status: DC

## 2020-01-23 NOTE — Plan of Care (Signed)
  Problem: Nutrition: Goal: Adequate nutrition will be maintained Outcome: Progressing   Problem: Pain Managment: Goal: General experience of comfort will improve Outcome: Progressing   

## 2020-01-23 NOTE — Progress Notes (Addendum)
PROGRESS NOTE  Chloe Harding  QMV:[id number] DOB: [birth year] DOA: 01/18/2020 PCP: [name], NP   Brief Narrative: Chloe Harding is a 74 y.o. female with a history of T2DM, CHF, chronic anemia, hyperlipidemia, hypertension, recent multiple hospitalizations under palliative care at home who presented 9/2 with confusion and multiple falls. In the ED CT was unremarkable, though subsequent MRI revealed a 23mm subdural hematoma along the left cerebral convexity without mass effect or infarct. Neurology consulted recommended prophylactic keppra, EEG which showed no seizures. After admission, hgb was noted to drop from 12.4g/dl >> 7.6. GI consulted and recommended supportive care. Transfusion has not yet been required.   Admitted 8/7-8/11: altered mental status,  falls at home, unsteady gait with negative CT head /C-spine,  Normal TSH ammonia slightly low vitamin D,heme positive stool, given her endoscopy in 2020 and colonoscopy in 2019 with rectosigmoid polyp resection diverticulosis, discussed with Dr. Benson Norway on last admission and givenhistory of erosive and severe esophagitis, advised PPI on discharge.  Assessment & Plan: Principal Problem:   Acute encephalopathy Active Problems:   Hypertension   CAD in native artery   AKI (acute kidney injury) ([place])  Acute metabolic encephalopathy: Unclear etiology,  suspecting metabolic, there is no neck stiffness. No fever, UTI on analysis, non-focal exam.TSH, ammonia, B12 are normal. MRI and EEG without causative findings. Neurology signed off. - Delirium precautions. Beginning to strongly suspect withdrawn/hypoactive delirium with concussive syndrome contributing.  Constipation: CT abdomen showed impacted stool. Given laxatives, She had 2 large nonbloody BMs. LBM 9/5. - Continue bowel regimen.  Traumatic subdural hematoma: MRI showed 2 mm small hematoma and probable even smaller SDH along right occiput. Repeat CT head 9/5 showed no hematoma.  -  Continue prophylactic keppra x7 days (started 9/5 PM). Discussed with neurology who reports no seizures picked up by EEG.  - Seizure precautions. - Avoid anticoagulation.  - Fall precautions. Pursuing SNF placement.   Dysphagia, history of esophageal strictures:  - D/w Dr. Benson Norway, knows patient well. Planning EGD later this week.  Anemia of chronic disease, heme-positive stool: With hgb drop due to hemoconcentration on admission. Hgb back down near baseline of 8g/dl in the absence of gross bleeding. +FOBT though no gross bleeding noted.  - Avoid anticoagulation/antiplatelets.  - Continue PPI BID - Trend CBC. Stable. - GI recommendations appreciated.  AKI-  Resolved.  Creatinine improved with IV fluids which will be continued Hyponatremia:  Resolved. Type 2 diabetes mellitus on sliding scale. Hypertension/HLD. Continue home meds. Chronic systolic CHF and grade 1 diastolic dysfunction.  Monitor closely while on IV fluids. Hypothyroidism on Synthroid. Depression on Lexapro. Urinary retention: Foley catheter Hypomagnesemia: Replacement in progress, Recheck labs in am. Hypophosphatemia:  Replacement in progress.  DVT prophylaxis: SCDs Code Status: Full Family Communication: None at bedside. Called husband without answer today. Disposition Plan:  Status is: Inpatient  Remains inpatient appropriate because:Altered mental status  Dispo: The patient is from: Home              Anticipated d/c is to: SNF              Anticipated d/c date is: 1 day              Patient currently is not medically stable to d/c.  Consultants:   Neurology  GI  Procedures:   EEG 01/22/2020: ABNORMALITY -Continuous slow, generalized -Periodic epileptiform discharges with triphasic morphology , generalized  IMPRESSION: This study is suggestive of moderate to severe diffuse encephalopathy, nonspecific etiology but  likely related to toxic-metabolic etiology. Periodic epileptiform discharges with  triphasic morphology were also noted at 1hz  which in the absence of any other epileptiform discharges has low likelihood of epileptogenicity.  No seizures were seen throughout the recording.  Antimicrobials:  None   Subjective: Not oriented to place or situation. Doesn't recall me from yesterday. Confused, not cooperative with exam. Denies any pain anywhere, doesn't know where she is. Denies pain anywhere. No bleeding reported by pt or staff.  Objective: Vitals:   01/22/20 2107 01/23/20 0500 01/23/20 0640 01/23/20 1328  BP: (!) 158/85  (!) 152/78 140/77  Pulse: 88  89 99  Resp: 18  18 18   Temp: (!) 97.5 F (36.4 C)  98.4 F (36.9 C) 98.1 F (36.7 C)  TempSrc: Oral  Oral Oral  SpO2: 100%  100%   Weight:  75.8 kg    Height:        Intake/Output Summary (Last 24 hours) at 01/23/2020 1704 Last data filed at 01/23/2020 1528 Gross per 24 hour  Intake 900 ml  Output 2200 ml  Net -1300 ml   Filed Weights   01/19/20 0500 01/21/20 0500 01/23/20 0500  Weight: 76.3 kg 76.4 kg 75.8 kg   Gen: 74 y.o. female in no distress Pulm: Nonlabored breathing room air. Clear. CV: Regular rate and rhythm. Systolic II/VI murmur, rub, or gallop. No JVD, no dependent edema. GI: Abdomen soft, non-tender, non-distended, with normoactive bowel sounds.  Ext: Warm, no deformities Skin: No new rashes, lesions or ulcers on visualized skin. Neuro: Alert, interactive, answers questions with dubious accuracy, oriented to herself and er birthday. Moves all extremities without focal neurological deficits. Psych: Judgement and insight appear impaired.   Data Reviewed: I have personally reviewed following labs and imaging studies  CBC: Recent Labs  Lab 01/18/20 2032 01/18/20 2032 01/19/20 0330 01/21/20 0315 01/21/20 1433 01/22/20 0410 01/23/20 0312  WBC 6.7  --  8.5 5.1  --  4.2 5.3  NEUTROABS 2.9  --   --   --   --   --   --   HGB 12.4   < > 9.6* 8.2* 7.8* 8.1* 8.5*  HCT 37.4   < > 29.6* 26.1* 24.4*  25.5* 26.5*  MCV 84.4  --  87.3 88.8  --  88.9 86.9  PLT 240  --  184 163  --  138* 200   < > = values in this interval not displayed.   Basic Metabolic Panel: Recent Labs  Lab 01/18/20 2032 01/19/20 0330 01/21/20 0315 01/22/20 0410  NA 129* 134* 140 139  K 4.2 4.6 3.4* 3.8  CL 100 102 110 111  CO2 23 23 22 23   GLUCOSE 102* 97 114* 106*  BUN 14 16 10  7*  CREATININE 1.31* 1.08* 0.70 0.60  CALCIUM 10.4* 10.2 8.5* 7.9*  MG  --   --  1.2* 1.8  PHOS  --   --  1.8* 2.6   GFR: Estimated Creatinine Clearance: 63.3 mL/min (by C-G formula based on SCr of 0.6 mg/dL). Liver Function Tests: No results for input(s): AST, ALT, ALKPHOS, BILITOT, PROT, ALBUMIN in the last 168 hours. No results for input(s): LIPASE, AMYLASE in the last 168 hours. Recent Labs  Lab 01/18/20 2306 01/19/20 0330  AMMONIA <9* 22   Coagulation Profile: No results for input(s): INR, PROTIME in the last 168 hours. Cardiac Enzymes: No results for input(s): CKTOTAL, CKMB, CKMBINDEX, TROPONINI in the last 168 hours. BNP (last 3 results) No results for input(s):  PROBNP in the last 8760 hours. HbA1C: No results for input(s): HGBA1C in the last 72 hours. CBG: Recent Labs  Lab 01/22/20 1705 01/22/20 2001 01/23/20 0752 01/23/20 1202 01/23/20 1645  GLUCAP 86 97 83 96 121*   Lipid Profile: No results for input(s): CHOL, HDL, LDLCALC, TRIG, CHOLHDL, LDLDIRECT in the last 72 hours. Thyroid Function Tests: No results for input(s): TSH, T4TOTAL, FREET4, T3FREE, THYROIDAB in the last 72 hours. Anemia Panel: No results for input(s): VITAMINB12, FOLATE, FERRITIN, TIBC, IRON, RETICCTPCT in the last 72 hours. Urine analysis:    Component Value Date/Time   COLORURINE YELLOW 01/19/2020 1030   APPEARANCEUR CLEAR 01/19/2020 1030   LABSPEC 1.010 01/19/2020 1030   PHURINE 6.0 01/19/2020 1030   GLUCOSEU NEGATIVE 01/19/2020 1030   HGBUR NEGATIVE 01/19/2020 1030   HGBUR trace-intact 05/14/2010 1402   BILIRUBINUR  NEGATIVE 01/19/2020 1030   KETONESUR NEGATIVE 01/19/2020 1030   PROTEINUR NEGATIVE 01/19/2020 1030   UROBILINOGEN 1.0 03/30/2015 1540   NITRITE NEGATIVE 01/19/2020 1030   LEUKOCYTESUR NEGATIVE 01/19/2020 1030   Recent Results (from the past 240 hour(s))  Culture, blood (Routine X 2) w Reflex to ID Panel     Status: None (Preliminary result)   Collection Time: 01/18/20  8:20 PM   Specimen: BLOOD  Result Value Ref Range Status   Specimen Description   Final    BLOOD LEFT ANTECUBITAL Performed at [facility], [place] [address], [place], [place] [address]    Special Requests   Final    BOTTLES DRAWN AEROBIC AND ANAEROBIC Blood Culture adequate volume Performed at [place] [address] [place], [place] [address]    Culture   Final    NO GROWTH 4 DAYS Performed at [facility], [place] [address] [place],  [address]    Report Status PENDING  Incomplete  SARS Coronavirus 2 by RT PCR (hospital order, performed in [facility] hospital lab) Nasopharyngeal Nasopharyngeal Swab     Status: None   Collection Time: 01/18/20  9:37 PM   Specimen: Nasopharyngeal Swab  Result Value Ref Range Status   SARS Coronavirus 2 NEGATIVE NEGATIVE Final    Comment: (NOTE) SARS-CoV-2 target nucleic acids are NOT DETECTED.  The SARS-CoV-2 RNA is generally detectable in upper and lower respiratory specimens during the acute phase of infection. The lowest concentration of SARS-CoV-2 viral copies this assay can detect is 250 copies / mL. A negative result does not preclude SARS-CoV-2 infection and should not be used as the sole basis for treatment or other patient management decisions.  A negative result may occur with improper specimen collection / handling, submission of specimen other than nasopharyngeal swab, presence of viral mutation(s) within the areas targeted by this assay, and inadequate number of viral copies (<250 copies / mL). A negative  result must be combined with clinical observations, patient history, and epidemiological information.  Fact Sheet for Patients:   StrictlyIdeas.no  Fact Sheet for Healthcare Providers: BankingDealers.co.za  This test is not yet approved or  cleared by the [place] FDA and has been authorized for detection and/or diagnosis of SARS-CoV-2 by FDA under an Emergency Use Authorization (EUA).  This EUA will remain in effect (meaning this test can be used) for the duration of the COVID-19 declaration under Section 564(b)(1) of the Act, 21 U.S.C. section 360bbb-3(b)(1), unless the authorization is terminated or revoked sooner.  Performed at [facility], [place] [address], [place],  [address]   Culture, blood (  Routine X 2) w Reflex to ID Panel     Status: None (Preliminary result)   Collection Time: 01/19/20  3:30 AM   Specimen: BLOOD  Result Value Ref Range Status   Specimen Description   Final    BLOOD LEFT HAND Performed at [place] [address] [place], [place] [address]    Special Requests   Final    BOTTLES DRAWN AEROBIC AND ANAEROBIC Blood Culture adequate volume Performed at [place] [address], [place], [place] [address]    Culture   Final    NO GROWTH 4 DAYS Performed at [facility], [place] [address] [place], [place] [address]    Report Status PENDING  Incomplete  Culture, Urine     Status: None   Collection Time: 01/19/20 10:36 AM   Specimen: Urine, Clean Catch  Result Value Ref Range Status   Specimen Description   Final    URINE, CLEAN CATCH Performed at [facility], [place] [address] [place], [place] [address]    Special Requests   Final    NONE Performed at [facility], [place] [address] [place], [place] [address]    Culture   Final    NO GROWTH Performed at [facility], [place]  [address] [place], [place] [address]    Report Status 01/20/2020 FINAL  Final      Radiology Studies: EEG adult  Result Date: 02/13/20 Lora Havens, MD     02/13/2020  3:59 PM Patient Name: Chloe Harding MRN: [id number] Epilepsy Attending: Lora Havens Referring Physician/Provider: Dr Donnetta Simpers Date: 02/13/20 Duration: 24.45 mins Patient history: 74 y.o. female with PMH significant for diabetes mellitus, CHF, chronic anemia, hyperlipidemia, hypertension  who presents with AMS and multiple falls. CTH showed no acute intracranial abnormality. MR Brain showed trace subdural hemorrhage along the left cerebral convexity, mainly inferiorly, only 2 mm in maximal thickness. A trace right occipital subdural collection could also be present. EEG to evaluate for seizure.  Level of alertness: Awake AEDs during EEG study: LEV Technical aspects: This EEG study was done with scalp electrodes positioned according to the 10-20 International system of electrode placement. Electrical activity was acquired at a sampling rate of 500Hz  and reviewed with a high frequency filter of 70Hz  and a low frequency filter of 1Hz . EEG data were recorded continuously and digitally stored. Description: No posterior dominant rhythm was seen. EEG showed continuous generalized 3 to 6 Hz theta-delta slowing. 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 Hyperventilation and photic stimulation were not performed.   ABNORMALITY -Continuous slow, generalized -Periodic epileptiform discharges with triphasic morphology , generalized IMPRESSION: This study is suggestive of moderate to severe diffuse encephalopathy, nonspecific etiology but likely related to toxic-metabolic etiology. 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  No seizures were seen throughout the recording.  Chloe Harding    Scheduled Meds: . atorvastatin  20 mg Oral Daily  . Chlorhexidine Gluconate Cloth  6 each Topical Daily  . escitalopram  10 mg Oral Daily  . ferrous sulfate  325 mg Oral BID PC  . folic acid  1 mg Oral Daily  . insulin aspart  0-6 Units Subcutaneous TID WC  . levETIRAcetam  500 mg Oral BID  . metoprolol tartrate  25 mg Oral BID  .  pantoprazole  40 mg Oral Daily  . ropinirole  5 mg Oral QHS   Continuous Infusions: . dextrose 5 % and 0.9% NaCl 50 mL/hr at 01/23/20 1418     LOS: 3 days   Time spent: 25 minutes.  Patrecia Pour, MD [facility] [email or web address] 01/23/2020, 5:04 PM

## 2020-01-23 NOTE — TOC Initial Note (Addendum)
Transition of Care [facility]) - Initial/Assessment Note    Patient Details  Name: Chloe Harding MRN: [id number] Date of Birth: [birth year]  [facility] of [facility] CM/SW Contact:    Lia Hopping, [place] Phone Number: 01/23/2020, 11:49 AM  Clinical Narrative:       Re: snf placement            CSW reached out to the patient daughter to discuss patient discharge plan of care. Patient daughter request the patient go to rehab before returning home. Daughter reports last week the patient was "talkative" and "Thursday she could only open her eyes." She reports the patient was ambulatory with a rolling walker. Daughter reports, the patient lives with her spouse but is home alone most of the time because her spouse works. Daughter reports the patient has not been to SNF for rehab. She is hoping the patient will progress with therapy. Daughter requested a facility close to her home. CSW notified daughter the SNF close to her home is not accepting outside admission at this time.  CSW will follow up with bed offers.  FL2 completed. PASRR pending.  Patient has been vaccinated.   Expected Discharge Plan: Skilled Nursing Facility Barriers to Discharge: Continued Medical Work up, Environmental education officer), Insurance Authorization   Patient Goals and CMS Choice   CMS Medicare.gov Compare Post Acute Care list provided to:: Other (Comment Required) Choice offered to / list presented to : Adult Children  Expected Discharge Plan and Services Expected Discharge Plan: [place] In-house Referral: Clinical Social Work Discharge Planning Services: NA Post Acute Care Choice: [place] Living arrangements for the past 2 months: Single Family Home                 DME Arranged: N/A         HH Arranged: NA [place] Agency: NA        Prior Living Arrangements/Services Living arrangements for the past 2 months: [place] Lives with:: Spouse Patient language and  need for interpreter reviewed:: No Do you feel safe going back to the place where you live?: Yes      Need for Family Participation in Patient Care: Yes (Comment) Care giver support system in place?: No (comment) (Spouse and daughter work) Current home services: DME Criminal Activity/Legal Involvement Pertinent to Current Situation/Hospitalization: No - Comment as needed  Activities of Daily Living Home Assistive Devices/Equipment: Cane (specify quad or straight), Walker (specify type), Other (Comment), CBG Meter, Grab bars in shower, Grab bars around toilet (walk-in shower, single point cane, front wheeled walker) ADL Screening (condition at time of admission) Patient's cognitive ability adequate to safely complete daily activities?: No Is the patient deaf or have difficulty hearing?: No Does the patient have difficulty seeing, even when wearing glasses/contacts?: No Does the patient have difficulty concentrating, remembering, or making decisions?: Yes Patient able to express need for assistance with ADLs?: No Does the patient have difficulty dressing or bathing?: Yes Independently performs ADLs?: Yes (appropriate for developmental age) Does the patient have difficulty walking or climbing stairs?: Yes Weakness of Legs: Both Weakness of Arms/Hands: None  Permission Sought/Granted Permission sought to share information with : Family Supports, Case Production designer, theatre/television/film granted to share info w AGENCY: [place] granted to share info w Relationship: Daughter/Spouse  Permission granted to share info w Contact Information: Delfino Lovett [phone]  Narda Amber Daughter [phone]  Emotional Assessment Appearance:: Appears stated age Attitude/Demeanor/Rapport:  Unable to Assess Affect (typically observed): Unable to Assess Orientation: : Oriented to Self Alcohol / Substance Use: Not Applicable Psych Involvement: No (comment)  Admission diagnosis:   Dehydration [E86.0] Hyponatremia [E87.1] Fall [W19.XXXA] Acute encephalopathy [G93.40] AKI (acute kidney injury) ([place]) [N17.9] Fall, initial encounter [W19.XXXA] Altered mental status, unspecified altered mental status type [R41.82] Patient Active Problem List   Diagnosis Date Noted  . AKI (acute kidney injury) ([place]) 01/19/2020  . Acute encephalopathy 01/18/2020  . Diarrhea 12/23/2019  . SOB (shortness of breath)   . Demand ischemia ([place])   . CAD in native artery   . Pneumonia 11/05/2019  . Hyponatremia 11/05/2019  . Acute CHF (congestive heart failure) ([place]) 11/05/2019  . Esophageal stricture   . Dysphagia 01/15/2019  . Symptomatic anemia 05/18/2017  . Acute-on-chronic kidney injury ([place]) 05/18/2017  . Hypertension 05/18/2017  . Hiatal hernia 05/18/2017  . History of esophageal stricture 05/18/2017  . Pure hypercholesterolemia 05/18/2017  . Diabetes mellitus 05/18/2017  . Solitary thyroid nodule 05/18/2017  . Hypotension 03/30/2015  . Sepsis ([place]) 03/30/2015  . CAP (community acquired pneumonia) 03/30/2015  . Hyperkalemia 03/30/2015  . Mild renal insufficiency 03/30/2015  . Nausea with vomiting 03/30/2015  . Diabetic neuropathy ([place]) 03/30/2015  . OBESITY 03/11/2010  . Type 2 diabetes mellitus ([place]) 12/26/2009  . Dyslipidemia 12/26/2009  . ANEMIA 12/26/2009  . DEPRESSION 12/26/2009  . HYPERTENSION, BENIGN ESSENTIAL 12/26/2009  . GERD 12/26/2009  . HIATAL HERNIA 12/26/2009  . CHRONIC KIDNEY DISEASE STAGE I 12/26/2009  . UTI 12/26/2009  . ARTHRITIS 12/26/2009  . ARTHROSCOPY, RIGHT KNEE, HX OF 12/26/2009   PCP:  [name], NP Pharmacy:   [facility] [address] ([address]), [place] [address] [place] [address] Phone: [phone] Fax: [phone]  [facility] [place], [place] [address] Phone: [phone] Fax: [phone]     Social Determinants  of Health (SDOH) Interventions    Readmission Risk Interventions No flowsheet data found.

## 2020-01-23 NOTE — NC FL2 (Addendum)
[place] LEVEL OF CARE SCREENING TOOL     IDENTIFICATION  Patient Name: Chloe Harding Birthdate: 10-31-1945 Sex: female Admission Date (Current Location): 01/18/2020  [facility] and [place] Number:  Herbalist and Address:  [facility],  [place], [place]      Provider Number: [id number]  Attending Physician Name and Address:  Patrecia Pour, MD  Relative Name and Phone Number:  Tillman,Jenifer Daughter [phone]  [phone]    Current Level of Care: Hospital Recommended Level of Care: [place] Prior Approval Number:    Date Approved/Denied:   PASRR Number:    Discharge Plan: SNF    Current Diagnoses: Patient Active Problem List   Diagnosis Date Noted  . AKI (acute kidney injury) ([place]) 01/19/2020  . Acute encephalopathy 01/18/2020  . Diarrhea 12/23/2019  . SOB (shortness of breath)   . Demand ischemia ([place])   . CAD in native artery   . Pneumonia 11/05/2019  . Hyponatremia 11/05/2019  . Acute CHF (congestive heart failure) ([place]) 11/05/2019  . Esophageal stricture   . Dysphagia 01/15/2019  . Symptomatic anemia 05/18/2017  . Acute-on-chronic kidney injury ([place]) 05/18/2017  . Hypertension 05/18/2017  . Hiatal hernia 05/18/2017  . History of esophageal stricture 05/18/2017  . Pure hypercholesterolemia 05/18/2017  . Diabetes mellitus 05/18/2017  . Solitary thyroid nodule 05/18/2017  . Hypotension 03/30/2015  . Sepsis ([place]) 03/30/2015  . CAP (community acquired pneumonia) 03/30/2015  . Hyperkalemia 03/30/2015  . Mild renal insufficiency 03/30/2015  . Nausea with vomiting 03/30/2015  . Diabetic neuropathy ([place]) 03/30/2015  . OBESITY 03/11/2010  . Type 2 diabetes mellitus ([place]) 12/26/2009  . Dyslipidemia 12/26/2009  . ANEMIA 12/26/2009  . DEPRESSION 12/26/2009  . HYPERTENSION, BENIGN ESSENTIAL 12/26/2009  . GERD 12/26/2009  . HIATAL HERNIA 12/26/2009  . CHRONIC KIDNEY DISEASE STAGE I  12/26/2009  . UTI 12/26/2009  . ARTHRITIS 12/26/2009  . ARTHROSCOPY, RIGHT KNEE, HX OF 12/26/2009    Orientation RESPIRATION BLADDER Height & Weight    Self    Normal Incontinent Weight: 167 lb 1.7 oz (75.8 kg) Height:  5\' 4"  (162.6 cm)  BEHAVIORAL SYMPTOMS/MOOD NEUROLOGICAL BOWEL NUTRITION STATUS      Incontinent Diet (Diet recommendations: Other(comment);Thin liquid (full liquids, ? advance with improved mentation?)Medication Administration: Crushed with pureeCompensations: Slow rate;Small sips/bites)  AMBULATORY STATUS COMMUNICATION OF NEEDS Skin   Extensive Assist Verbally Other (Comment) (Moisture Coccyx Stage 2 Skin Ulcer)                       Personal Care Assistance Level of Assistance  Bathing, Dressing, Feeding Bathing Assistance: Maximum assistance Feeding assistance: Limited assistance Dressing Assistance: Maximum assistance     Functional Limitations Info  Sight, Hearing, Speech Sight Info: Adequate Hearing Info: Adequate Speech Info: Adequate    SPECIAL CARE FACTORS FREQUENCY  Speech therapy     PT Frequency: 5x/week OT Frequency: 5x/week     Speech Therapy Frequency: 1-3x/week      Contractures Contractures Info: Not present    Additional Factors Info  Insulin Sliding Scale Code Status Info: Fullcode Allergies Info: Allergies: Amlodipine Besylate Psychotropic Info: Lexapro, Keppra Insulin Sliding Scale Info: See Mar       Current Medications (01/23/2020):  This is the current hospital active medication list Current Facility-Administered Medications  Medication Dose Route Frequency Provider Last Rate Last Admin  . acetaminophen (TYLENOL) suppository 650 mg  650 mg Rectal Q4H PRN Shawna Clamp, MD  650 mg at 01/22/20 2115  . atorvastatin (LIPITOR) tablet 20 mg  20 mg Oral Daily Rise Patience, MD   20 mg at 01/23/20 1016  . bisacodyl (DULCOLAX) suppository 10 mg  10 mg Rectal Daily PRN Antonieta Pert, MD   10 mg at 01/19/20 1349  .  Chlorhexidine Gluconate Cloth 2 % PADS 6 each  6 each Topical Daily Shawna Clamp, MD   6 each at 01/23/20 1016  . dextrose 5 %-0.9 % sodium chloride infusion   Intravenous Continuous Antonieta Pert, MD 50 mL/hr at 01/22/20 1642 New Bag at 01/22/20 1642  . escitalopram (LEXAPRO) tablet 10 mg  10 mg Oral Daily Rise Patience, MD   10 mg at 01/23/20 1016  . ferrous sulfate tablet 325 mg  325 mg Oral BID PC Rise Patience, MD   325 mg at 01/23/20 0829  . folic acid (FOLVITE) tablet 1 mg  1 mg Oral Daily Rise Patience, MD   1 mg at 01/23/20 1016  . hydrALAZINE (APRESOLINE) injection 5 mg  5 mg Intravenous Q8H PRN Kc, Ramesh, MD      . insulin aspart (novoLOG) injection 0-6 Units  0-6 Units Subcutaneous TID WC Rise Patience, MD      . levETIRAcetam (KEPPRA) tablet 500 mg  500 mg Oral BID Donnetta Simpers, MD   500 mg at 01/23/20 1016  . metoprolol tartrate (LOPRESSOR) tablet 25 mg  25 mg Oral BID Rise Patience, MD   25 mg at 01/23/20 1016  . ondansetron (ZOFRAN) injection 4 mg  4 mg Intravenous Q6H PRN Patrecia Pour, MD      . pantoprazole (PROTONIX) EC tablet 40 mg  40 mg Oral Daily Green, Terri L, RPH   40 mg at 01/23/20 1016  . phenol (CHLORASEPTIC) mouth spray 1 spray  1 spray Mouth/Throat PRN Shawna Clamp, MD   1 spray at 01/22/20 0909  . rOPINIRole (REQUIP) tablet 5 mg  5 mg Oral QHS Rise Patience, MD   5 mg at 01/22/20 2115     Discharge Medications: Please see discharge summary for a list of discharge medications.  Relevant Imaging Results:  Relevant Lab Results:   Additional Information [id number]  Lia Hopping, LCSW

## 2020-01-23 NOTE — Progress Notes (Signed)
Physical Therapy Treatment Patient Details Name: Chloe Harding MRN: [id number] DOB: [birth year] Today's Date: 01/23/2020    History of Present Illness 74 year old with history of DM2, HTN, HLD, CAD, CHF, CKD stage IIIa presents with generalized weakness and multiple falls at home. Pt previously admitted for CHF exacerbation/pneumonia and heme positive stools. Intermittent behavioral disturbances at home.  Upon admission she was febrile, acute kidney injury, hyponatremia, metabolic encephalopathy.    PT Comments    Pt only responds to name and pain when BP cuff inflated on her arm.  General bed mobility comments: Pt required Max Assist and increased time to sit to EOB.  Poor collapsed posture and inability to keep eyes fixed/focused present with suly sick pale face.  General transfer comment: unable to follow commands for hand placement to self rise from elevated bed.  Assisted via stand pivot sit "Bear Hug" with MAX vc's to decrease fear pt was able to rise with MAX Assist and support her own weight with static standing.  Required increased assist to weight shift and complete 1/4 turn due to fear.  Then required increased assist to sit/lower to [facility].  Pt sat BSC x 4 min again with poor collaspsed posture and barely strong enough to hold her head up.  No BM but did a small amount of bloody mucus.  Assisted her ogg [facility] same "Bear Hug" fashion then second assist switched out BSC and placed recliner behind pt.  Assisted with controlled stand to sit again with pt fear and slight resistance.  Positioned in recliner semi upright as pt appeared completely fatigued/wore out.  Pt unable to hold a cup of water but will take sips when offered with assistance.   Rec + 2 assist back to bed.     Follow Up Recommendations  SNF     Equipment Recommendations  None recommended by PT    Recommendations for Other Services       Precautions / Restrictions Precautions Precautions: Fall Restrictions Weight  Bearing Restrictions: No    Mobility  Bed Mobility Overal bed mobility: Needs Assistance Bed Mobility: Supine to Sit     Supine to sit: Max assist;+2 for physical assistance;+2 for safety/equipment     General bed mobility comments: Pt required Max Assist and increased time to sit to EOB.  Poor collapsed posture and inability to keep eyes fixed/focused present with suly sick pale face  Transfers Overall transfer level: Needs assistance Equipment used: None Transfers: Stand Pivot Transfers           General transfer comment: unable to follow commands for hand placement to self rise from elevated bed.  Assisted via stand pivot sit "Bear Hug" with MAX vc's to decrease fear pt was able to rise with MAX Assist and support her own weight with static standing.  Required increased assist to weight shift and complete 1/4 turn due to fear.  Then required increased assist to sit/lower to [facility].  Pt sat BSC x 4 min again with poor collaspsed posture and barely strong enough to hold her head up.  No BM but did a small amount of bloody mucus.  Assisted her ogg [facility] same "Bear Hug" fashion then second assist switched out BSC and placed recliner behind pt.  Assisted with controlled stand to sit again with pt fear and slight resistance.  Positioned in recliner semi upright as pt appeared completely fatigued/wore out.  Ambulation/Gait             General Gait Details: unable to  attempt due to poor transfer ability   Marine scientist [place] (Stroke Patients Only)       Balance                                            Cognition Arousal/Alertness: Awake/alert;Lethargic (25/75)                                     General Comments: Pt AxO x 1 briefly      Exercises      General Comments        Pertinent Vitals/Pain Pain Assessment: Faces Faces Pain Scale: Hurts a little bit Pain Location: BP cuff  inflated pt grimacing Pain Descriptors / Indicators: Moaning    Home Living                      Prior Function            PT Goals (current goals can now be found in the care plan section) Progress towards PT goals: Progressing toward goals    Frequency    Min 3X/week      PT Plan Current plan remains appropriate    Co-evaluation PT/OT/SLP Co-Evaluation/Treatment: Yes            AM-PAC PT "6 Clicks" Mobility   Outcome Measure  Help needed turning from your back to your side while in a flat bed without using bedrails?: Total Help needed moving from lying on your back to sitting on the side of a flat bed without using bedrails?: Total Help needed moving to and from a bed to a chair (including a wheelchair)?: Total Help needed standing up from a chair using your arms (e.g., wheelchair or bedside chair)?: Total Help needed to walk in hospital room?: Total Help needed climbing 3-5 steps with a railing? : Total 6 Click Score: 6    End of Session Equipment Utilized During Treatment: Gait belt Activity Tolerance: Patient limited by fatigue;Treatment limited secondary to medical complications (Comment) Patient left: in chair;with call bell/phone within reach;with chair alarm set Nurse Communication: Mobility status PT Visit Diagnosis: Difficulty in walking, not elsewhere classified (R26.2);Muscle weakness (generalized) (M62.81)     Time: 8938-1017 PT Time Calculation (min) (ACUTE ONLY): 25 min  Charges:  $Therapeutic Activity: 23-37 mins                     Rica Koyanagi  PTA Acute  Rehabilitation Services Pager      [phone] Office      [phone]

## 2020-01-23 NOTE — Progress Notes (Signed)
Subjective: No acute events.  Objective: Vital signs in last 24 hours: Temp:  [97.5 F (36.4 C)-99 F (37.2 C)] 98.4 F (36.9 C) (09/07 0640) Pulse Rate:  [86-89] 89 (09/07 0640) Resp:  [18-24] 18 (09/07 0640) BP: (149-158)/(78-85) 152/78 (09/07 0640) SpO2:  [99 %-100 %] 100 % (09/07 0640) Weight:  [75.8 kg] 75.8 kg (09/07 0500) Last BM Date: 01/21/20  Intake/Output from previous day: 09/06 0701 - 09/07 0700 In: 968.8 [P.O.:780; I.V.:188.8] Out: 2300 [Urine:2300] Intake/Output this shift: No intake/output data recorded.  General appearance: slow to speak, confused, always answers in the affirmative Resp: clear to auscultation bilaterally Cardio: regular rate and rhythm GI: soft, non-tender; bowel sounds normal; no masses,  no organomegaly Extremities: extremities normal, atraumatic, no cyanosis or edema  Lab Results: Recent Labs    01/21/20 0315 01/21/20 0315 01/21/20 1433 01/22/20 0410 01/23/20 0312  WBC 5.1  --   --  4.2 5.3  HGB 8.2*   < > 7.8* 8.1* 8.5*  HCT 26.1*   < > 24.4* 25.5* 26.5*  PLT 163  --   --  138* 200   < > = values in this interval not displayed.   BMET Recent Labs    01/21/20 0315 01/22/20 0410  NA 140 139  K 3.4* 3.8  CL 110 111  CO2 22 23  GLUCOSE 114* 106*  BUN 10 7*  CREATININE 0.70 0.60  CALCIUM 8.5* 7.9*   LFT No results for input(s): PROT, ALBUMIN, AST, ALT, ALKPHOS, BILITOT, BILIDIR, IBILI in the last 72 hours. PT/INR No results for input(s): LABPROT, INR in the last 72 hours. Hepatitis Panel No results for input(s): HEPBSAG, HCVAB, HEPAIGM, HEPBIGM in the last 72 hours. C-Diff No results for input(s): CDIFFTOX in the last 72 hours. Fecal Lactopherrin No results for input(s): FECLLACTOFRN in the last 72 hours.  Studies/Results: CT HEAD WO CONTRAST  Result Date: 01/21/2020 CLINICAL DATA:  Altered mental status. EXAM: CT HEAD WITHOUT CONTRAST TECHNIQUE: Contiguous axial images were obtained from the base of the skull  through the vertex without intravenous contrast. COMPARISON:  January 18, 2020. FINDINGS: Brain: Mild diffuse cortical atrophy is noted. Mild chronic ischemic white matter disease is noted. No mass effect or midline shift is noted. Ventricular size is within normal limits. There is no evidence of mass lesion, hemorrhage or acute infarction. Vascular: No hyperdense vessel or unexpected calcification. Skull: Normal. Negative for fracture or focal lesion. Sinuses/Orbits: No acute finding. Other: None. IMPRESSION: Mild diffuse cortical atrophy. Mild chronic ischemic white matter disease. No acute intracranial abnormality seen. Electronically Signed   By: Marijo Conception M.D.   On: 01/21/2020 09:58   EEG adult  Result Date: 01/22/2020 Chloe Havens, MD     01/22/2020  3:59 PM Patient Name: Chloe Harding MRN: [id number] Epilepsy Attending: Lora Harding Referring Physician/Provider: Dr Donnetta Simpers Date: 01/22/2020 Duration: 24.45 mins Patient history: 74 y.o. female with PMH significant for diabetes mellitus, CHF, chronic anemia, hyperlipidemia, hypertension  who presents with AMS and multiple falls. CTH showed no acute intracranial abnormality. MR Brain showed trace subdural hemorrhage along the left cerebral convexity, mainly inferiorly, only 2 mm in maximal thickness. A trace right occipital subdural collection could also be present. EEG to evaluate for seizure.   Level of alertness: Awake AEDs during EEG study: LEV Technical aspects: This EEG study was done with scalp electrodes positioned according to the 10-20 International system of electrode placement. Electrical activity was acquired at a sampling rate of  500Hz  and reviewed with a high frequency filter of 70Hz  and a low frequency filter of 1Hz . EEG data were recorded continuously and digitally stored. Description: No posterior dominant rhythm was seen. EEG showed continuous generalized 3 to 6 Hz theta-delta slowing. 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 Hyperventilation and photic stimulation were not performed.   ABNORMALITY -Continuous slow, generalized -Periodic epileptiform discharges with triphasic morphology , generalized IMPRESSION: This study is suggestive of moderate to severe diffuse encephalopathy, nonspecific etiology but likely related to toxic-metabolic etiology. 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  No seizures were seen throughout the recording. Chloe Harding    Medications:  Scheduled:  atorvastatin  20 mg Oral Daily   Chlorhexidine Gluconate Cloth  6 each Topical Daily   escitalopram  10 mg Oral Daily   ferrous sulfate  325 mg Oral BID PC   folic acid  1 mg Oral Daily   insulin aspart  0-6 Units Subcutaneous TID WC   levETIRAcetam  500 mg Oral BID   metoprolol tartrate  25 mg Oral BID   pantoprazole  40 mg Oral Daily   ropinirole  5 mg Oral QHS   Continuous:  dextrose 5 % and 0.9% NaCl 50 mL/hr at 01/22/20 1642    Assessment/Plan: 1) Acute metabolic encephalopathy. 2) Anemia. 3) Heme positive stool. 4) Dysphagia and history of esophagitis with stricturing.   The patient is well-known to me.  She has a history of severe esophagitis that was difficult to control, but ultimately healing was achieved.  She had two strictures in the esophagus, proximal and distal that required sequential dilation.  The dilations were successfully performed in the outpatient setting.  With the recurrence of her dysphagia and the anemia, which is currently stable, she will need a repeat EGD with possible dilation.  Her metabolic encephalopathy is an issue and I would like to see her mentation better.  Plan: 1) EGD on Thurs or Friday this week. 2) Continue with PPI. 3) Follow HGB and transfuse as necessary.  LOS: 3 days   [place] Freyre  D 01/23/2020, 8:49 AM

## 2020-01-23 NOTE — Progress Notes (Signed)
Transition of Care (TOC) -30 day Note      Patient Details   Name: Chloe Harding   MRN: [id number]  Date of Birth: 1945/09/24     Transition of Care [facility]) CM/SW Contact   Name: Kathrin Greathouse  Phone Number: [phone]  Date: 01/23/2020  Time: 11:28pm     MUST ID: [id number]     To Whom it May Concern:     Please be advised that the above patient will require a short-term nursing home stay, anticipated 30 days or less rehabilitation and strengthening. The plan is for return home.

## 2020-01-24 DIAGNOSIS — I5042 Chronic combined systolic (congestive) and diastolic (congestive) heart failure: Secondary | ICD-10-CM

## 2020-01-24 DIAGNOSIS — S065X9A Traumatic subdural hemorrhage with loss of consciousness of unspecified duration, initial encounter: Secondary | ICD-10-CM

## 2020-01-24 LAB — CBC
HCT: 26.4 % — ABNORMAL LOW (ref 36.0–46.0)
Hemoglobin: 9 g/dL — ABNORMAL LOW (ref 12.0–15.0)
MCH: 28.9 pg (ref 26.0–34.0)
MCHC: 34.1 g/dL (ref 30.0–36.0)
MCV: 84.9 fL (ref 80.0–100.0)
Platelets: 196 10*3/uL (ref 150–400)
RBC: 3.11 MIL/uL — ABNORMAL LOW (ref 3.87–5.11)
RDW: 15.5 % (ref 11.5–15.5)
WBC: 4.9 10*3/uL (ref 4.0–10.5)
nRBC: 0 % (ref 0.0–0.2)

## 2020-01-24 LAB — BASIC METABOLIC PANEL
Anion gap: 5 (ref 5–15)
BUN: <5 mg/dL — ABNORMAL LOW (ref 8–23)
CO2: 23 mmol/L (ref 22–32)
Calcium: 7.3 mg/dL — ABNORMAL LOW (ref 8.9–10.3)
Chloride: 107 mmol/L (ref 98–111)
Creatinine, Ser: 0.67 mg/dL (ref 0.44–1.00)
GFR calc Af Amer: >60 mL/min (ref >60)
GFR calc non Af Amer: >60 mL/min (ref >60)
Glucose, Bld: 102 mg/dL — ABNORMAL HIGH (ref 70–99)
Potassium: 3.6 mmol/L (ref 3.5–5.1)
Sodium: 135 mmol/L (ref 135–145)

## 2020-01-24 LAB — CULTURE, BLOOD (ROUTINE X 2)
Culture: NO GROWTH
Culture: NO GROWTH
Special Requests: ADEQUATE
Special Requests: ADEQUATE

## 2020-01-24 LAB — GLUCOSE, CAPILLARY
Glucose-Capillary: 76 mg/dL (ref 70–99)
Glucose-Capillary: 77 mg/dL (ref 70–99)
Glucose-Capillary: 66 mg/dL — ABNORMAL LOW (ref 70–99)
Glucose-Capillary: 68 mg/dL — ABNORMAL LOW (ref 70–99)
Glucose-Capillary: 61 mg/dL — ABNORMAL LOW (ref 70–99)
Glucose-Capillary: 73 mg/dL (ref 70–99)

## 2020-01-24 NOTE — Progress Notes (Signed)
Patient ID: Chloe Harding, female   DOB: [birth year], 74 y.o.   MRN: [id number]   PROGRESS NOTE    SORAIDA VICKERS  BHA:[id number] DOB: 06/26/1945 DOA: 01/18/2020 PCP: [name], NP   Brief Narrative:  74 y.o. female with a history of T2DM, CHF, chronic anemia, hyperlipidemia, hypertension, recent multiple hospitalizations under palliative care at home who presented 9/2 with confusion and multiple falls. In the ED CT was unremarkable, though subsequent MRI revealed a 85mm subdural hematoma along the left cerebral convexity without mass effect or infarct. Neurology consulted recommended prophylactic keppra, EEG which showed no seizures. After admission, hgb was noted to drop from 12.4g/dl >> 7.6. GI consulted and recommended supportive care. Transfusion has not yet been required.   Admitted 8/7-8/11: altered mental status, falls at home, unsteady gait with negative CT head /C-spine, Normal TSH ammonia slightly low vitamin D,heme positive stool, given her endoscopy in 2020 and colonoscopy in 2019 with rectosigmoid polyp resection diverticulosis, discussed with Dr. Benson Norway on last admission and givenhistory of erosive and severe esophagitis, advised PPI on discharge.  Assessment & Plan:   Acute metabolic encephalopathy: Unclear etiology -Suspecting metabolic. There is no neck stiffness. No fever, UTI, nonfocal exam. -TSH, ammonia, B12 levels normal. MRI and EEG without positive findings. Neurology signed off. -Delirium precautions.  Traumatic subdural hematoma -MRI showed 2 mm small hematoma and probable one smaller SDH along the right occiput. Repeat CT head on 01/21/2020 showed no hematoma. -Continue prophylactic Keppra for 7 days (started 01/21/2020 p.m.). EEG showed no seizures. Neurology signed off -Seizure precaution. Fall precautions. Avoid anticoagulations -PT recommended SNF placement. Social worker following  Constipation -CT abdomen showed impacted stool. Treated with laxatives.  Continue bowel regimen.  Dysphagia, history of esophageal strictures Anemia of chronic disease, heme positive stool -Avoid anticoagulation/antiplatelets. Continue PPI. Hemoglobin currently at baseline. Monitor. FOBT positive although no gross bleeding noted -GI following and planning for EGD on Thursday or Friday. Monitor H&H  AKI: Resolved with IV fluids. DC.   Diabetes mellitus type 2 -Continue CBGs with SSI  Chronic systolic and diastolic CHF -Currently compensated. Strict input and output. Daily weights. Fluid restriction. Continue metoprolol  Hypothyroidism -Continue Synthroid  Depression -Continue Lexapro  Urinary retention -Continue Foley catheter  Generalized deconditioning -PT recommends SNF placement. Currently listed as full code although patient follows up with palliative care as an outpatient. Will consult palliative care for goals of care discussion   DVT prophylaxis: SCDs Code Status: Full Family Communication: None at bedside. Disposition Plan:  Status is: Inpatient  Remains inpatient appropriate because:Altered mental status  Dispo: The patient is from: Home  Anticipated d/c is to: SNF  Anticipated d/c date is: 2 day  Patient currently is not medically stable to d/c.  Consultants: Neurology/GI  Procedures:   EEG 01/22/2020: ABNORMALITY -Continuousslow, generalized -Periodic epileptiform dischargeswith triphasic morphology, generalized  IMPRESSION: This study is suggestive of moderatetosevere diffuse encephalopathy, nonspecific etiology but likely related to toxic-metabolic etiology. Periodic epileptiform discharges with triphasic morphology were also noted at 1hz  which in the absence of any other epileptiform discharges has low likelihood of epileptogenicity.No seizures were seen throughout the recording.  Antimicrobials:  None    Subjective: Patient seen and examined at bedside. She is awake but  a very poor historian. Keeps repeating her name again and again. No overnight fever, vomiting reported.  Objective: Vitals:   01/23/20 1328 01/23/20 2204 01/24/20 0602 01/24/20 0955  BP: 140/77 133/71 (!) 148/65 130/66  Pulse: 99 (!) 103 84 87  Resp: 18 20 20 15   Temp: 98.1 F (36.7 C) 97.9 F (36.6 C) 98.4 F (36.9 C) 98 F (36.7 C)  TempSrc: Oral Oral Axillary Oral  SpO2:  100% 99% 99%  Weight:   76.5 kg   Height:        Intake/Output Summary (Last 24 hours) at 01/24/2020 1111 Last data filed at 01/24/2020 1013 Gross per 24 hour  Intake 2273.14 ml  Output 1060 ml  Net 1213.14 ml   Filed Weights   01/21/20 0500 01/23/20 0500 01/24/20 0602  Weight: 76.4 kg 75.8 kg 76.5 kg    Examination:  General exam: Appears calm and comfortable. Elderly female lying in bed. Very poor historian. Respiratory system: Bilateral decreased breath sounds at bases with some scattered crackles Cardiovascular system: S1 & S2 heard, mild intermittent tachycardia present gastrointestinal system: Abdomen is nondistended, soft and nontender. Normal bowel sounds heard. Extremities: No cyanosis, clubbing; trace lower extremity edema Central nervous system: Awake and confused to time and place. No focal neurological deficits. Moving extremities Skin: No rashes, lesions or ulcers Psychiatry: Cannot be assessed because of mental status   Data Reviewed: I have personally reviewed following labs and imaging studies  CBC: Recent Labs  Lab 01/18/20 2032 01/18/20 2032 01/19/20 0330 01/19/20 0330 01/21/20 0315 01/21/20 1433 01/22/20 0410 01/23/20 0312 01/24/20 0341  WBC 6.7   < > 8.5  --  5.1  --  4.2 5.3 4.9  NEUTROABS 2.9  --   --   --   --   --   --   --   --   HGB 12.4   < > 9.6*   < > 8.2* 7.8* 8.1* 8.5* 9.0*  HCT 37.4   < > 29.6*   < > 26.1* 24.4* 25.5* 26.5* 26.4*  MCV 84.4   < > 87.3  --  88.8  --  88.9 86.9 84.9  PLT 240   < > 184  --  163  --  138* 200 196   < > = values in this interval  not displayed.   Basic Metabolic Panel: Recent Labs  Lab 01/18/20 2032 01/19/20 0330 01/21/20 0315 01/22/20 0410 01/24/20 0341  NA 129* 134* 140 139 135  K 4.2 4.6 3.4* 3.8 3.6  CL 100 102 110 111 107  CO2 23 23 22 23 23   GLUCOSE 102* 97 114* 106* 102*  BUN 14 16 10  7* <5*  CREATININE 1.31* 1.08* 0.70 0.60 0.67  CALCIUM 10.4* 10.2 8.5* 7.9* 7.3*  MG  --   --  1.2* 1.8  --   PHOS  --   --  1.8* 2.6  --    GFR: Estimated Creatinine Clearance: 63.6 mL/min (by C-G formula based on SCr of 0.67 mg/dL). Liver Function Tests: No results for input(s): AST, ALT, ALKPHOS, BILITOT, PROT, ALBUMIN in the last 168 hours. No results for input(s): LIPASE, AMYLASE in the last 168 hours. Recent Labs  Lab 01/18/20 2306 01/19/20 0330  AMMONIA <9* 22   Coagulation Profile: No results for input(s): INR, PROTIME in the last 168 hours. Cardiac Enzymes: No results for input(s): CKTOTAL, CKMB, CKMBINDEX, TROPONINI in the last 168 hours. BNP (last 3 results) No results for input(s): PROBNP in the last 8760 hours. HbA1C: No results for input(s): HGBA1C in the last 72 hours. CBG: Recent Labs  Lab 01/23/20 0752 01/23/20 1202 01/23/20 1645 01/23/20 2208 01/24/20 0737  GLUCAP 83 96 121* 88 76   Lipid Profile: No results for input(s):  CHOL, HDL, LDLCALC, TRIG, CHOLHDL, LDLDIRECT in the last 72 hours. Thyroid Function Tests: No results for input(s): TSH, T4TOTAL, FREET4, T3FREE, THYROIDAB in the last 72 hours. Anemia Panel: No results for input(s): VITAMINB12, FOLATE, FERRITIN, TIBC, IRON, RETICCTPCT in the last 72 hours. Sepsis Labs: Recent Labs  Lab 01/18/20 2032  LATICACIDVEN 0.7    Recent Results (from the past 240 hour(s))  Culture, blood (Routine X 2) w Reflex to ID Panel     Status: None   Collection Time: 01/18/20  8:20 PM   Specimen: BLOOD  Result Value Ref Range Status   Specimen Description   Final    BLOOD LEFT ANTECUBITAL Performed at [facility] [address], [place], [place] [address]    Special Requests   Final    BOTTLES DRAWN AEROBIC AND ANAEROBIC Blood Culture adequate volume Performed at [facility] [address], [place], [place] [address]    Culture   Final    NO GROWTH 5 DAYS Performed at [facility], [place] [address] [place], [place] [address]    Report Status 01/24/2020 FINAL  Final  SARS Coronavirus 2 by RT PCR (hospital order, performed in [facility] hospital lab) Nasopharyngeal Nasopharyngeal Swab     Status: None   Collection Time: 01/18/20  9:37 PM   Specimen: Nasopharyngeal Swab  Result Value Ref Range Status   SARS Coronavirus 2 NEGATIVE NEGATIVE Final    Comment: (NOTE) SARS-CoV-2 target nucleic acids are NOT DETECTED.  The SARS-CoV-2 RNA is generally detectable in upper and lower respiratory specimens during the acute phase of infection. The lowest concentration of SARS-CoV-2 viral copies this assay can detect is 250 copies / mL. A negative result does not preclude SARS-CoV-2 infection and should not be used as the sole basis for treatment or other patient management decisions.  A negative result may occur with improper specimen collection / handling, submission of specimen other than nasopharyngeal swab, presence of viral mutation(s) within the areas targeted by this assay, and inadequate number of viral copies (<250 copies / mL). A negative result must be combined with clinical observations, patient history, and epidemiological information.  Fact Sheet for Patients:   StrictlyIdeas.no  Fact Sheet for Healthcare Providers: BankingDealers.co.za  This test is not yet approved or  cleared by the [place] FDA and has been authorized for detection and/or diagnosis of SARS-CoV-2 by FDA under an Emergency Use Authorization (EUA).  This EUA will remain in effect (meaning this test can be used) for the duration of  the COVID-19 declaration under Section 564(b)(1) of the Act, 21 U.S.C. section 360bbb-3(b)(1), unless the authorization is terminated or revoked sooner.  Performed at [facility], [place] [address] [place], [place] [address]   Culture, blood (Routine X 2) w Reflex to ID Panel     Status: None   Collection Time: 01/19/20  3:30 AM   Specimen: BLOOD  Result Value Ref Range Status   Specimen Description   Final    BLOOD LEFT HAND Performed at [facility] [address], [place], [place] [address]    Special Requests   Final    BOTTLES DRAWN AEROBIC AND ANAEROBIC Blood Culture adequate volume Performed at [facility] [address] [place], [place] [address]    Culture   Final    NO GROWTH 5 DAYS Performed at [facility], [place] [address] [place], [place] [address]    Report  Status 01/24/2020 FINAL  Final  Culture, Urine     Status: None   Collection Time: 01/19/20 10:36 AM   Specimen: Urine, Clean Catch  Result Value Ref Range Status   Specimen Description   Final    URINE, CLEAN CATCH Performed at [facility], [place] [address], [place], [place] [address]    Special Requests   Final    NONE Performed at [facility], [place] [address] [place], [place] [address]    Culture   Final    NO GROWTH Performed at [facility], [place] [address] [place], [place] [address]    Report Status 01/20/2020 FINAL  Final         Radiology Studies: EEG adult  Result Date: 2020-02-03 Lora Havens, MD     2020-02-03  3:59 PM Patient Name: MEYGAN KYSER MRN: [id number] Epilepsy Attending: Lora Havens Referring Physician/Provider: Dr Donnetta Simpers Date: Feb 03, 2020 Duration: 24.45 mins Patient history: 74 y.o. female with PMH significant for diabetes mellitus, CHF, chronic anemia, hyperlipidemia, hypertension  who presents with AMS and multiple falls. CTH showed no acute  intracranial abnormality. MR Brain showed trace subdural hemorrhage along the left cerebral convexity, mainly inferiorly, only 2 mm in maximal thickness. A trace right occipital subdural collection could also be present. EEG to evaluate for seizure.  Level of alertness: Awake AEDs during EEG study: LEV Technical aspects: This EEG study was done with scalp electrodes positioned according to the 10-20 International system of electrode placement. Electrical activity was acquired at a sampling rate of 500Hz  and reviewed with a high frequency filter of 70Hz  and a low frequency filter of 1Hz . EEG data were recorded continuously and digitally stored. Description: No posterior dominant rhythm was seen. EEG showed continuous generalized 3 to 6 Hz theta-delta slowing. 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 Hyperventilation and photic stimulation were not performed.   ABNORMALITY -Continuous slow, generalized -Periodic epileptiform discharges with triphasic morphology , generalized IMPRESSION: This study is suggestive of moderate to severe diffuse encephalopathy, nonspecific etiology but likely related to toxic-metabolic etiology. 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  No seizures were seen throughout the recording. [name]        Scheduled Meds: . atorvastatin  20 mg Oral Daily  . Chlorhexidine Gluconate Cloth  6 each Topical Daily  . escitalopram  10 mg Oral Daily  . ferrous sulfate  325 mg Oral BID PC  . folic acid  1 mg Oral Daily  . insulin aspart  0-6 Units Subcutaneous TID WC  . levETIRAcetam  500 mg Oral BID  . metoprolol tartrate  25 mg Oral BID  . pantoprazole  40 mg Oral Daily  . ropinirole  5 mg Oral QHS   Continuous Infusions: . dextrose 5 % and 0.9% NaCl 50 mL/hr at 01/23/20 1418           Asmi Fugere, MD [facility] 01/24/2020, 11:11 AM

## 2020-01-24 NOTE — Progress Notes (Signed)
Unassigned patient  subjective: [name] is a 74 year old white female with a history of diabetes, congestive heart failure, hyperlipidemia, hypertension, chronic anemia who is under the care of palliative care presented to the hospital on 01/18/2020 with confusion after multiple falls.  She is found to have a subdural hematoma without any mass-effect or infarct.  She was seen in consultation by Dr. Carol Ada yesterday for dysphagia and possible dilation of esophageal stricture as she has a history of severe erosive esophagitis but due to acute metabolic encephalopathy, an EGD has been postponed till her mental status improves.  Patient is pleasantly confused today and is not able to verbalize any GI complaints.  She claims she does not know where she is in which hospital she is in.  She did not answer any of my questions correctly.  In fact her sentences were quite garbled and not relevant.  She was noted to have diverticulosis and the rectosigmoid polyp that was resected in 2019 when a colonoscopy was done.  Objective: Vital signs in last 24 hours: Temp:  [97.9 F (36.6 C)-98.4 F (36.9 C)] 98 F (36.7 C) (09/08 0955) Pulse Rate:  [84-103] 87 (09/08 0955) Resp:  [15-20] 15 (09/08 0955) BP: (130-148)/(65-77) 130/66 (09/08 0955) SpO2:  [99 %-100 %] 99 % (09/08 0955) Weight:  [76.5 kg] 76.5 kg (09/08 0602) Last BM Date: 01/24/20  Intake/Output from previous day: 09/07 0701 - 09/08 0700 In: 2273.1 [P.O.:480; I.V.:1793.1] Out: 800 [Urine:800] Intake/Output this shift: Total I/O In: 240 [P.O.:240] Out: 560 [Urine:560]  General appearance: cooperative but pleasantly confused, appears stated age, no distress and slowed mentation Resp: diminished breath sounds bibasilar and bilaterally Cardio: regular rate and rhythm, S1, S2 normal, no murmur, click, rub or gallop GI: soft, non-tender; bowel sounds normal; no masses,  no organomegaly Extremities: extremities normal, atraumatic, no  cyanosis or edema  Lab Results: Recent Labs    01/22/20 0410 01/23/20 0312 01/24/20 0341  WBC 4.2 5.3 4.9  HGB 8.1* 8.5* 9.0*  HCT 25.5* 26.5* 26.4*  PLT 138* 200 196   BMET Recent Labs    01/22/20 0410 01/24/20 0341  NA 139 135  K 3.8 3.6  CL 111 107  CO2 23 23  GLUCOSE 106* 102*  BUN 7* <5*  CREATININE 0.60 0.67  CALCIUM 7.9* 7.3*   Studies/Results: EEG adult  Result Date: 01/22/2020 Lora Havens, MD     01/22/2020  3:59 PM Patient Name: Chloe Harding MRN: [id number] Epilepsy Attending: Lora Havens Referring Physician/Provider: Dr Donnetta Simpers Date: 01/22/2020 Duration: 24.45 mins Patient history: 74 y.o. female with PMH significant for diabetes mellitus, CHF, chronic anemia, hyperlipidemia, hypertension  who presents with AMS and multiple falls. CTH showed no acute intracranial abnormality. MR Brain showed trace subdural hemorrhage along the left cerebral convexity, mainly inferiorly, only 2 mm in maximal thickness. A trace right occipital subdural collection could also be present. EEG to evaluate for seizure.  Level of alertness: Awake AEDs during EEG study: LEV Technical aspects: This EEG study was done with scalp electrodes positioned according to the 10-20 International system of electrode placement. Electrical activity was acquired at a sampling rate of 500Hz  and reviewed with a high frequency filter of 70Hz  and a low frequency filter of 1Hz . EEG data were recorded continuously and digitally stored. Description: No posterior dominant rhythm was seen. EEG showed continuous generalized 3 to 6 Hz theta-delta slowing. 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 Hyperventilation and photic  stimulation were not performed.   ABNORMALITY -Continuous slow, generalized -Periodic epileptiform discharges with triphasic morphology , generalized IMPRESSION: This study is suggestive of moderate to severe diffuse encephalopathy, nonspecific  etiology but likely related to toxic-metabolic etiology. 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  No seizures were seen throughout the recording. [name]    Medications: I have reviewed the patient's current medications.  Assessment/Plan: 1) GERD/history of esophageal strictures anemia of chronic disease/heme positive stools-EGD will be planned by Dr. Benson Norway with the next couple of days.  Continue PPIs. 2) History of diverticulosis/colonic polyps. 3) ?Acute metabolic encephalopathy etiology is unclear. 4) Chronic constipation-on a bowel regimen. 5) Traumatic subdural hematoma status post multiple falls. 6) AODM. 7) Chronic systolic and diastolic congestive heart failure. 8) AKI-resolved with IV hydration. Juanita Craver 01/24/2020, 12:16 PM

## 2020-01-24 NOTE — Progress Notes (Signed)
Occupational Therapy Progress Note  Patient very pleasant and agreeable this morning, confused oriented to self only. Patient mod A to elevate trunk for bed mobility, mod A with bilateral hand held to stand pivot to recliner chair with mod cues to turn feet to recliner. Patient set up A with brushing teeth, washing face but would not initiate using shampoo cap therefore required total A to wash and comb out knots in hair. Patient demo impulsivity trying to get out of recliner. Redirected, chair alarm on and CNA made aware.     01/24/20 1300  OT Visit Information  Last OT Received On 01/24/20  Assistance Needed +2  History of Present Illness 74 year old with history of DM2, HTN, HLD, CAD, CHF, CKD stage IIIa presents with generalized weakness and multiple falls at home. Pt previously admitted for CHF exacerbation/pneumonia and heme positive stools. Intermittent behavioral disturbances at home.  Upon admission she was febrile, acute kidney injury, hyponatremia, metabolic encephalopathy.  Precautions  Precautions Fall  Pain Assessment  Pain Assessment Faces  Faces Pain Scale 0  Cognition  Arousal/Alertness Awake/alert  Behavior During Therapy Impulsive  Overall Cognitive Status No family/caregiver present to determine baseline cognitive functioning  General Comments oriented to self only, tried to get out of recliner without assistance, CNA made aware and chair re-alarmed   ADL  Overall ADL's  Needs assistance/impaired  Grooming Moderate assistance;Oral care;Wash/dry face;Brushing hair  Grooming Details (indicate cue type and reason) set up assist patient able to brush her teeth and wash her face, attempt to have patient use shampoo cap to wash hair, requires total A for this and to comb knots from her hair  Toilet Transfer Moderate assistance;Cueing for safety;Cueing for sequencing;Stand-pivot;BSC  Toilet Transfer Details (indicate cue type and reason) to recliner, bilateral hand held  assistance and mod cues to turn feet  Functional mobility during ADLs Moderate assistance;Cueing for safety;Cueing for sequencing  Bed Mobility  Overal bed mobility Needs Assistance  Bed Mobility Supine to Sit  Supine to sit Mod assist  General bed mobility comments increased time and cues to initiate moving LEs to EOB, mod A to elevate trunk  Balance  Overall balance assessment Needs assistance  Sitting-balance support Feet supported  Sitting balance-Leahy Scale Fair  Standing balance support Bilateral upper extremity supported;During functional activity  Standing balance-Leahy Scale Poor  Restrictions  Weight Bearing Restrictions No  Transfers  Overall transfer level Needs assistance  Equipment used None  Transfers Stand Pivot Transfers  Stand pivot transfers Mod assist  General transfer comment bilateral hand held assistance from OT and mod cues to turn feet to complete stand pivot to recliner  OT - End of Session  Equipment Utilized During Treatment Gait belt  Activity Tolerance Patient tolerated treatment well  Patient left in chair;with call bell/phone within reach;with chair alarm set  Nurse Communication Mobility status  OT Assessment/Plan  OT Plan Discharge plan remains appropriate  OT Visit Diagnosis Other abnormalities of gait and mobility (R26.89);History of falling (Z91.81);Muscle weakness (generalized) (M62.81);Other symptoms and signs involving cognitive function  OT Frequency (ACUTE ONLY) Min 2X/week  Follow Up Recommendations SNF;Supervision/Assistance - 24 hour  OT Equipment Other (comment) (defer to next venue)  AM-PAC OT "6 Clicks" Daily Activity Outcome Measure (Version 2)  Help from another person eating meals? 3  Help from another person taking care of personal grooming? 2  Help from another person toileting, which includes using toliet, bedpan, or urinal? 2  Help from another person bathing (including washing, rinsing, drying)? 2  Help from another person  to put on and taking off regular upper body clothing? 2  Help from another person to put on and taking off regular lower body clothing? 2  6 Click Score 13  OT Goal Progression  Progress towards OT goals Progressing toward goals  Acute Rehab OT Goals  Patient Stated Goal agreeable to get to recliner  OT Goal Formulation With patient  Time For Goal Achievement 02/03/20  Potential to Achieve Goals Fair  ADL Goals  Pt Will Perform Grooming sitting;with min assist  Pt Will Perform Upper Body Bathing with min assist;sitting  Pt Will Transfer to Toilet with mod assist;stand pivot transfer;bedside commode  Additional ADL Goal #1 Patient will follow 1 step directions with moderate cues in 2/3 trials in order to safely participate in self care tasks.  OT Time Calculation  OT Start Time (ACUTE ONLY) 0947  OT Stop Time (ACUTE ONLY) 1014  OT Time Calculation (min) 27 min  OT General Charges  $OT Visit 1 Visit  OT Treatments  $Self Care/Home Management  23-37 mins   Chloe Harding OT OT pager: [phone]

## 2020-01-24 NOTE — TOC Progression Note (Signed)
Transition of Care [facility]) - Progression Note    Patient Details  Name: Chloe Harding MRN: [id number] Date of Birth: 1946-05-09  Transition of Care [facility]) CM/SW [place], LCSW Phone Number: 01/24/2020, 10:42 AM  Clinical Narrative:    Daughter chose [facility] SNF. CSW notified the facility staff Kitty.  Facility will initiate [employer]. PASRR: [id number]    Expected Discharge Plan: [place] Barriers to Discharge: Continued Medical Work up  Expected Discharge Plan and Services Expected Discharge Plan: [place] In-house Referral: Clinical Social Work Discharge Planning Services: NA Post Acute Care Choice: [place] Living arrangements for the past 2 months: Single Family Home                 DME Arranged: N/A         HH Arranged: NA HH Agency: NA         Social Determinants of Health (SDOH) Interventions    Readmission Risk Interventions No flowsheet data found.

## 2020-01-24 NOTE — H&P (View-Only) (Signed)
Unassigned patient  subjective: [name] is a 74 year old white female with a history of diabetes, congestive heart failure, hyperlipidemia, hypertension, chronic anemia who is under the care of palliative care presented to the hospital on 01/18/2020 with confusion after multiple falls.  She is found to have a subdural hematoma without any mass-effect or infarct.  She was seen in consultation by Dr. Carol Ada yesterday for dysphagia and possible dilation of esophageal stricture as she has a history of severe erosive esophagitis but due to acute metabolic encephalopathy, an EGD has been postponed till her mental status improves.  Patient is pleasantly confused today and is not able to verbalize any GI complaints.  She claims she does not know where she is in which hospital she is in.  She did not answer any of my questions correctly.  In fact her sentences were quite garbled and not relevant.  She was noted to have diverticulosis and the rectosigmoid polyp that was resected in 2019 when a colonoscopy was done.  Objective: Vital signs in last 24 hours: Temp:  [97.9 F (36.6 C)-98.4 F (36.9 C)] 98 F (36.7 C) (09/08 0955) Pulse Rate:  [84-103] 87 (09/08 0955) Resp:  [15-20] 15 (09/08 0955) BP: (130-148)/(65-77) 130/66 (09/08 0955) SpO2:  [99 %-100 %] 99 % (09/08 0955) Weight:  [76.5 kg] 76.5 kg (09/08 0602) Last BM Date: 01/24/20  Intake/Output from previous day: 09/07 0701 - 09/08 0700 In: 2273.1 [P.O.:480; I.V.:1793.1] Out: 800 [Urine:800] Intake/Output this shift: Total I/O In: 240 [P.O.:240] Out: 560 [Urine:560]  General appearance: cooperative but pleasantly confused, appears stated age, no distress and slowed mentation Resp: diminished breath sounds bibasilar and bilaterally Cardio: regular rate and rhythm, S1, S2 normal, no murmur, click, rub or gallop GI: soft, non-tender; bowel sounds normal; no masses,  no organomegaly Extremities: extremities normal, atraumatic, no  cyanosis or edema  Lab Results: Recent Labs    01/22/20 0410 01/23/20 0312 01/24/20 0341  WBC 4.2 5.3 4.9  HGB 8.1* 8.5* 9.0*  HCT 25.5* 26.5* 26.4*  PLT 138* 200 196   BMET Recent Labs    01/22/20 0410 01/24/20 0341  NA 139 135  K 3.8 3.6  CL 111 107  CO2 23 23  GLUCOSE 106* 102*  BUN 7* <5*  CREATININE 0.60 0.67  CALCIUM 7.9* 7.3*   Studies/Results: EEG adult  Result Date: 01/22/2020 Lora Havens, MD     01/22/2020  3:59 PM Patient Name: Chloe Harding MRN: [id number] Epilepsy Attending: Lora Havens Referring Physician/Provider: Dr Donnetta Simpers Date: 01/22/2020 Duration: 24.45 mins Patient history: 74 y.o. female with PMH significant for diabetes mellitus, CHF, chronic anemia, hyperlipidemia, hypertension  who presents with AMS and multiple falls. CTH showed no acute intracranial abnormality. MR Brain showed trace subdural hemorrhage along the left cerebral convexity, mainly inferiorly, only 2 mm in maximal thickness. A trace right occipital subdural collection could also be present. EEG to evaluate for seizure.  Level of alertness: Awake AEDs during EEG study: LEV Technical aspects: This EEG study was done with scalp electrodes positioned according to the 10-20 International system of electrode placement. Electrical activity was acquired at a sampling rate of 500Hz  and reviewed with a high frequency filter of 70Hz  and a low frequency filter of 1Hz . EEG data were recorded continuously and digitally stored. Description: No posterior dominant rhythm was seen. EEG showed continuous generalized 3 to 6 Hz theta-delta slowing. 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 Hyperventilation and photic  stimulation were not performed.   ABNORMALITY -Continuous slow, generalized -Periodic epileptiform discharges with triphasic morphology , generalized IMPRESSION: This study is suggestive of moderate to severe diffuse encephalopathy, nonspecific  etiology but likely related to toxic-metabolic etiology. 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  No seizures were seen throughout the recording. [name]    Medications: I have reviewed the patient's current medications.  Assessment/Plan: 1) GERD/history of esophageal strictures anemia of chronic disease/heme positive stools-EGD will be planned by Dr. Benson Norway with the next couple of days.  Continue PPIs. 2) History of diverticulosis/colonic polyps. 3) ?Acute metabolic encephalopathy etiology is unclear. 4) Chronic constipation-on a bowel regimen. 5) Traumatic subdural hematoma status post multiple falls. 6) AODM. 7) Chronic systolic and diastolic congestive heart failure. 8) AKI-resolved with IV hydration. Juanita Craver 01/24/2020, 12:16 PM

## 2020-01-25 ENCOUNTER — Inpatient Hospital Stay (HOSPITAL_COMMUNITY): Payer: Medicare Other | Admitting: Certified Registered Nurse Anesthetist

## 2020-01-25 ENCOUNTER — Encounter (HOSPITAL_COMMUNITY)
Admission: EM | Disposition: A | Discharge status: Skilled Nursing Facility | Payer: Self-pay | Source: Home / Self Care | Attending: Internal Medicine

## 2020-01-25 DIAGNOSIS — Z7189 Other specified counseling: Secondary | ICD-10-CM

## 2020-01-25 DIAGNOSIS — E876 Hypokalemia: Secondary | ICD-10-CM

## 2020-01-25 DIAGNOSIS — W19XXXA Unspecified fall, initial encounter: Secondary | ICD-10-CM

## 2020-01-25 DIAGNOSIS — R4182 Altered mental status, unspecified: Secondary | ICD-10-CM

## 2020-01-25 DIAGNOSIS — R1310 Dysphagia, unspecified: Secondary | ICD-10-CM

## 2020-01-25 DIAGNOSIS — Z515 Encounter for palliative care: Secondary | ICD-10-CM

## 2020-01-25 HISTORY — PX: ESOPHAGOGASTRODUODENOSCOPY (EGD) WITH PROPOFOL: SHX5813

## 2020-01-25 HISTORY — PX: BALLOON DILATION: SHX5330

## 2020-01-25 LAB — CBC WITH DIFFERENTIAL/PLATELET
Abs Immature Granulocytes: 0.13 10*3/uL — ABNORMAL HIGH (ref 0.00–0.07)
Basophils Absolute: 0 10*3/uL (ref 0.0–0.1)
Basophils Relative: 1 %
Eosinophils Absolute: 0 10*3/uL (ref 0.0–0.5)
Eosinophils Relative: 0 %
HCT: 28.7 % — ABNORMAL LOW (ref 36.0–46.0)
Hemoglobin: 9.4 g/dL — ABNORMAL LOW (ref 12.0–15.0)
Immature Granulocytes: 3 %
Lymphocytes Relative: 45 %
Lymphs Abs: 2.3 10*3/uL (ref 0.7–4.0)
MCH: 28 pg (ref 26.0–34.0)
MCHC: 32.8 g/dL (ref 30.0–36.0)
MCV: 85.4 fL (ref 80.0–100.0)
Monocytes Absolute: 0.3 10*3/uL (ref 0.1–1.0)
Monocytes Relative: 7 %
Neutro Abs: 2.2 10*3/uL (ref 1.7–7.7)
Neutrophils Relative %: 44 %
Platelets: 225 10*3/uL (ref 150–400)
RBC: 3.36 MIL/uL — ABNORMAL LOW (ref 3.87–5.11)
RDW: 15.7 % — ABNORMAL HIGH (ref 11.5–15.5)
WBC: 4.9 10*3/uL (ref 4.0–10.5)
nRBC: 0 % (ref 0.0–0.2)

## 2020-01-25 LAB — BASIC METABOLIC PANEL
Anion gap: 6 (ref 5–15)
BUN: <5 mg/dL — ABNORMAL LOW (ref 8–23)
CO2: 22 mmol/L (ref 22–32)
Calcium: 7.6 mg/dL — ABNORMAL LOW (ref 8.9–10.3)
Chloride: 109 mmol/L (ref 98–111)
Creatinine, Ser: 0.67 mg/dL (ref 0.44–1.00)
GFR calc Af Amer: >60 mL/min (ref >60)
GFR calc non Af Amer: >60 mL/min (ref >60)
Glucose, Bld: 68 mg/dL — ABNORMAL LOW (ref 70–99)
Potassium: 3.3 mmol/L — ABNORMAL LOW (ref 3.5–5.1)
Sodium: 137 mmol/L (ref 135–145)

## 2020-01-25 LAB — MAGNESIUM: Magnesium: 1.3 mg/dL — ABNORMAL LOW (ref 1.7–2.4)

## 2020-01-25 LAB — GLUCOSE, CAPILLARY
Glucose-Capillary: 57 mg/dL — ABNORMAL LOW (ref 70–99)
Glucose-Capillary: 64 mg/dL — ABNORMAL LOW (ref 70–99)
Glucose-Capillary: 69 mg/dL — ABNORMAL LOW (ref 70–99)
Glucose-Capillary: 141 mg/dL — ABNORMAL HIGH (ref 70–99)
Glucose-Capillary: 103 mg/dL — ABNORMAL HIGH (ref 70–99)
Glucose-Capillary: 108 mg/dL — ABNORMAL HIGH (ref 70–99)
Glucose-Capillary: 76 mg/dL (ref 70–99)
Glucose-Capillary: 77 mg/dL (ref 70–99)
Glucose-Capillary: 80 mg/dL (ref 70–99)

## 2020-01-25 LAB — SARS CORONAVIRUS 2 BY RT PCR (HOSPITAL ORDER, PERFORMED IN ~~LOC~~ HOSPITAL LAB): SARS Coronavirus 2: NEGATIVE

## 2020-01-25 SURGERY — ESOPHAGOGASTRODUODENOSCOPY (EGD) WITH PROPOFOL
Anesthesia: Monitor Anesthesia Care

## 2020-01-25 MED ORDER — PROPOFOL 10 MG/ML IV BOLUS
INTRAVENOUS | Status: DC | PRN
  Administered 2020-01-25: 40 mg via INTRAVENOUS

## 2020-01-25 MED ORDER — PROPOFOL 500 MG/50ML IV EMUL
INTRAVENOUS | Status: DC | PRN
  Administered 2020-01-25: 125 ug/kg/min via INTRAVENOUS

## 2020-01-25 MED ORDER — PANTOPRAZOLE SODIUM 40 MG PO TBEC
40.0000 mg | DELAYED_RELEASE_TABLET | Freq: Two times a day (BID) | ORAL | Status: DC
  Administered 2020-01-25 – 2020-01-26 (×2): 40 mg via ORAL
  Filled 2020-01-25 (×2): qty 1

## 2020-01-25 MED ORDER — LACTATED RINGERS IV SOLN: INTRAVENOUS | Status: DC | PRN

## 2020-01-25 MED ORDER — DEXTROSE 50 % IV SOLN: 12.5000 g | INTRAVENOUS | Status: DC | PRN

## 2020-01-25 MED ORDER — DEXTROSE 50 % IV SOLN: 12.5000 g | INTRAVENOUS | Status: AC

## 2020-01-25 MED ORDER — DEXTROSE 50 % IV SOLN
INTRAVENOUS | Status: AC
  Administered 2020-01-25: 12.5 mL
  Filled 2020-01-25: qty 50

## 2020-01-25 MED ORDER — MAGNESIUM SULFATE 2 GM/50ML IV SOLN
2.0000 g | Freq: Once | INTRAVENOUS | Status: AC
  Administered 2020-01-25: 2 g via INTRAVENOUS
  Filled 2020-01-25: qty 50

## 2020-01-25 MED ORDER — LIDOCAINE 2% (20 MG/ML) 5 ML SYRINGE
INTRAMUSCULAR | Status: DC | PRN
  Administered 2020-01-25: 60 mg via INTRAVENOUS

## 2020-01-25 MED ORDER — SUCRALFATE 1 G PO TABS
1.0000 g | ORAL_TABLET | Freq: Three times a day (TID) | ORAL | Status: DC
  Administered 2020-01-25 – 2020-01-26 (×3): 1 g via ORAL
  Filled 2020-01-25 (×3): qty 1

## 2020-01-25 MED ORDER — DEXTROSE-NACL 5-0.9 % IV SOLN: INTRAVENOUS | Status: DC

## 2020-01-25 MED ORDER — POTASSIUM CHLORIDE CRYS ER 20 MEQ PO TBCR
40.0000 meq | EXTENDED_RELEASE_TABLET | Freq: Once | ORAL | Status: AC
  Administered 2020-01-25: 40 meq via ORAL
  Filled 2020-01-25: qty 2

## 2020-01-25 SURGICAL SUPPLY — 15 items

## 2020-01-25 NOTE — Progress Notes (Signed)
Patient ID: Chloe Harding, female   DOB: 1946-01-07, 74 y.o.   MRN: [id number]   PROGRESS NOTE    Chloe Harding  FTD:[id number] DOB: 1945/10/30 DOA: 01/18/2020 PCP: [name], NP   Brief Narrative:  74 y.o. female with a history of T2DM, CHF, chronic anemia, hyperlipidemia, hypertension, recent multiple hospitalizations under palliative care at home who presented 9/2 with confusion and multiple falls. In the ED CT was unremarkable, though subsequent MRI revealed a 62mm subdural hematoma along the left cerebral convexity without mass effect or infarct. Neurology consulted recommended prophylactic keppra, EEG which showed no seizures. After admission, hgb was noted to drop from 12.4g/dl >> 7.6. GI consulted and recommended supportive care. Transfusion has not yet been required.   Admitted 8/7-8/11: altered mental status, falls at home, unsteady gait with negative CT head /C-spine, Normal TSH ammonia slightly low vitamin D,heme positive stool, given her endoscopy in 2020 and colonoscopy in 2019 with rectosigmoid polyp resection diverticulosis, discussed with Dr. Benson Norway on last admission and givenhistory of erosive and severe esophagitis, advised PPI on discharge.  Assessment & Plan:   Acute metabolic encephalopathy: Unclear etiology -Suspecting metabolic. There is no neck stiffness. No fever, UTI, nonfocal exam. -TSH, ammonia, B12 levels normal. MRI and EEG without positive findings. Neurology signed off. -Delirium precautions.  Traumatic subdural hematoma -MRI showed 2 mm small hematoma and probable one smaller SDH along the right occiput. Repeat CT head on 01/21/2020 showed no hematoma. -Continue prophylactic Keppra for 7 days (started 01/21/2020 p.m.). EEG showed no seizures. Neurology signed off -Seizure precaution. Fall precautions. Avoid anticoagulations -PT recommended SNF placement. Social worker following  Constipation -CT abdomen showed impacted stool. Treated with laxatives.  Continue bowel regimen.  Dysphagia, history of esophageal strictures Anemia of chronic disease, heme positive stool -Avoid anticoagulation/antiplatelets. Continue PPI. Hemoglobin currently at baseline. Monitor. FOBT positive although no gross bleeding noted -GI following and planning for EGD probably today. Monitor H&H  AKI: Resolved with IV fluids  Diabetes mellitus type 2 with hyperglycemia -Continue CBGs with SSI -Very hyperglycemic this morning.  Patient is n.p.o.  Will start D5 normal saline at 50 cc an hour  Hypokalemia -Replace.  Repeat a.m. labs  Hypomagnesemia -Replace.  Repeat a.m. labs  Chronic systolic and diastolic CHF -Currently compensated. Strict input and output. Daily weights. Fluid restriction. Continue metoprolol  Hypothyroidism -Continue Synthroid  Depression -Continue Lexapro  Urinary retention -Continue Foley catheter  Generalized deconditioning -PT recommends SNF placement. Currently listed as full code although patient follows up with palliative care as an outpatient. palliative care for goals of care discussion has been consulted   DVT prophylaxis: SCDs Code Status: Full Family Communication: None at bedside. Disposition Plan:  Status is: Inpatient  Remains inpatient appropriate because:Altered mental status  Dispo: The patient is from: Home  Anticipated d/c is to: SNF  Anticipated d/c date is: 1 day  Patient currently is not medically stable to d/c.  EGD pending as per GI.  Consultants: Neurology/GI  Procedures:   EEG 01/22/2020: ABNORMALITY -Continuousslow, generalized -Periodic epileptiform dischargeswith triphasic morphology, generalized  IMPRESSION: This study is suggestive of moderatetosevere diffuse encephalopathy, nonspecific etiology but likely related to toxic-metabolic etiology. Periodic epileptiform discharges with triphasic morphology were also noted at 1hz  which in the absence  of any other epileptiform discharges has low likelihood of epileptogenicity.No seizures were seen throughout the recording.  Antimicrobials:  None    Subjective: Patient seen and examined at bedside.  Extremely poor historian.  Awake but confused.  No  overnight fever, vomiting or seizures reported.  Nursing staff reports episodes of hypoglycemia Objective: Vitals:   01/24/20 0955 01/24/20 1248 01/24/20 2056 01/25/20 0548  BP: 130/66 135/62 (!) 147/67 (!) 165/76  Pulse: 87 76 85 86  Resp: 15 16 16 16   Temp: 98 F (36.7 C) 98.3 F (36.8 C) 98.6 F (37 C) 98.3 F (36.8 C)  TempSrc: Oral Oral Oral Oral  SpO2: 99% 99% 100% 100%  Weight:    75.2 kg  Height:        Intake/Output Summary (Last 24 hours) at 01/25/2020 0737 Last data filed at 01/25/2020 0548 Gross per 24 hour  Intake 830 ml  Output 2160 ml  Net -1330 ml   Filed Weights   01/23/20 0500 01/24/20 0602 01/25/20 0548  Weight: 75.8 kg 76.5 kg 75.2 kg    Examination:  General exam: No acute distress. Elderly female lying in bed. Very poor historian. Respiratory system: Bilateral decreased breath sounds at bases with scattered crackles, no wheezing  cardiovascular system: Rate controlled, S1-S2 heard  gastrointestinal system: Abdomen is nondistended, soft and nontender.  Bowel sounds are heard  extremities: Mild lower extremity edema present.  No clubbing Central nervous system: Still confused.  Awake.  Does not participate in conversation much.  No focal neurological deficits.  Moves extremities  skin: No obvious ecchymosis/rashes Psychiatry: Could not be assessed because of mental status   Data Reviewed: I have personally reviewed following labs and imaging studies  CBC: Recent Labs  Lab 01/18/20 2032 01/19/20 0330 01/21/20 0315 01/21/20 0315 01/21/20 1433 01/22/20 0410 01/23/20 0312 01/24/20 0341 01/25/20 0334  WBC 6.7   < > 5.1  --   --  4.2 5.3 4.9 4.9  NEUTROABS 2.9  --   --   --   --   --   --    --  2.2  HGB 12.4   < > 8.2*   < > 7.8* 8.1* 8.5* 9.0* 9.4*  HCT 37.4   < > 26.1*   < > 24.4* 25.5* 26.5* 26.4* 28.7*  MCV 84.4   < > 88.8  --   --  88.9 86.9 84.9 85.4  PLT 240   < > 163  --   --  138* 200 196 225   < > = values in this interval not displayed.   Basic Metabolic Panel: Recent Labs  Lab 01/19/20 0330 01/21/20 0315 01/22/20 0410 01/24/20 0341 01/25/20 0334  NA 134* 140 139 135 137  K 4.6 3.4* 3.8 3.6 3.3*  CL 102 110 111 107 109  CO2 23 22 23 23 22   GLUCOSE 97 114* 106* 102* 68*  BUN 16 10 7* <5* <5*  CREATININE 1.08* 0.70 0.60 0.67 0.67  CALCIUM 10.2 8.5* 7.9* 7.3* 7.6*  MG  --  1.2* 1.8  --  1.3*  PHOS  --  1.8* 2.6  --   --    GFR: Estimated Creatinine Clearance: 63.1 mL/min (by C-G formula based on SCr of 0.67 mg/dL). Liver Function Tests: No results for input(s): AST, ALT, ALKPHOS, BILITOT, PROT, ALBUMIN in the last 168 hours. No results for input(s): LIPASE, AMYLASE in the last 168 hours. Recent Labs  Lab 01/18/20 2306 01/19/20 0330  AMMONIA <9* 22   Coagulation Profile: No results for input(s): INR, PROTIME in the last 168 hours. Cardiac Enzymes: No results for input(s): CKTOTAL, CKMB, CKMBINDEX, TROPONINI in the last 168 hours. BNP (last 3 results) No results for input(s): PROBNP in the last  8760 hours. HbA1C: No results for input(s): HGBA1C in the last 72 hours. CBG: Recent Labs  Lab 01/24/20 1150 01/24/20 1747 01/24/20 1824 01/24/20 2057 01/24/20 2230  GLUCAP 77 68* 66* 61* 73   Lipid Profile: No results for input(s): CHOL, HDL, LDLCALC, TRIG, CHOLHDL, LDLDIRECT in the last 72 hours. Thyroid Function Tests: No results for input(s): TSH, T4TOTAL, FREET4, T3FREE, THYROIDAB in the last 72 hours. Anemia Panel: No results for input(s): VITAMINB12, FOLATE, FERRITIN, TIBC, IRON, RETICCTPCT in the last 72 hours. Sepsis Labs: Recent Labs  Lab 01/18/20 2032  LATICACIDVEN 0.7    Recent Results (from the past 240 hour(s))  Culture,  blood (Routine X 2) w Reflex to ID Panel     Status: None   Collection Time: 01/18/20  8:20 PM   Specimen: BLOOD  Result Value Ref Range Status   Specimen Description   Final    BLOOD LEFT ANTECUBITAL Performed at [facility] [address], [place], [place] [address]    Special Requests   Final    BOTTLES DRAWN AEROBIC AND ANAEROBIC Blood Culture adequate volume Performed at [facility] [address], [place], [place] [address]    Culture   Final    NO GROWTH 5 DAYS Performed at [facility], [place] [address] [place], [place] [address]    Report Status 01/24/2020 FINAL  Final  SARS Coronavirus 2 by RT PCR (hospital order, performed in [facility] hospital lab) Nasopharyngeal Nasopharyngeal Swab     Status: None   Collection Time: 01/18/20  9:37 PM   Specimen: Nasopharyngeal Swab  Result Value Ref Range Status   SARS Coronavirus 2 NEGATIVE NEGATIVE Final    Comment: (NOTE) SARS-CoV-2 target nucleic acids are NOT DETECTED.  The SARS-CoV-2 RNA is generally detectable in upper and lower respiratory specimens during the acute phase of infection. The lowest concentration of SARS-CoV-2 viral copies this assay can detect is 250 copies / mL. A negative result does not preclude SARS-CoV-2 infection and should not be used as the sole basis for treatment or other patient management decisions.  A negative result may occur with improper specimen collection / handling, submission of specimen other than nasopharyngeal swab, presence of viral mutation(s) within the areas targeted by this assay, and inadequate number of viral copies (<250 copies / mL). A negative result must be combined with clinical observations, patient history, and epidemiological information.  Fact Sheet for Patients:   StrictlyIdeas.no  Fact Sheet for Healthcare Providers: BankingDealers.co.za  This test is not yet approved or   cleared by the [place] FDA and has been authorized for detection and/or diagnosis of SARS-CoV-2 by FDA under an Emergency Use Authorization (EUA).  This EUA will remain in effect (meaning this test can be used) for the duration of the COVID-19 declaration under Section 564(b)(1) of the Act, 21 U.S.C. section 360bbb-3(b)(1), unless the authorization is terminated or revoked sooner.  Performed at [facility], [place] [address], [place], [place] [address]   Culture, blood (Routine X 2) w Reflex to ID Panel     Status: None   Collection Time: 01/19/20  3:30 AM   Specimen: BLOOD  Result Value Ref Range Status   Specimen Description   Final    BLOOD LEFT HAND Performed at [facility] [address] [place], [place] [address]    Special Requests   Final    BOTTLES DRAWN AEROBIC AND ANAEROBIC Blood Culture adequate volume Performed  at [facility], [place] [address] [place], [place] [address]    Culture   Final    NO GROWTH 5 DAYS Performed at [facility], [place] [address], [place], [place] [address]    Report Status 01/24/2020 FINAL  Final  Culture, Urine     Status: None   Collection Time: 01/19/20 10:36 AM   Specimen: Urine, Clean Catch  Result Value Ref Range Status   Specimen Description   Final    URINE, CLEAN CATCH Performed at [facility], [place] [address], [place], [place] [address]    Special Requests   Final    NONE Performed at [facility], [place] [address] [place], [place] [address]    Culture   Final    NO GROWTH Performed at [facility], [place] [address] [place], [place] [address]    Report Status 01/20/2020 FINAL  Final         Radiology Studies: No results found.      Scheduled Meds: . atorvastatin  20 mg Oral Daily  . Chlorhexidine Gluconate Cloth  6 each Topical Daily  . escitalopram  10 mg Oral Daily  . ferrous sulfate  325 mg Oral  BID PC  . folic acid  1 mg Oral Daily  . insulin aspart  0-6 Units Subcutaneous TID WC  . levETIRAcetam  500 mg Oral BID  . metoprolol tartrate  25 mg Oral BID  . pantoprazole  40 mg Oral Daily  . ropinirole  5 mg Oral QHS   Continuous Infusions:         Aline August, MD [facility] 01/25/2020, 7:37 AM

## 2020-01-25 NOTE — Progress Notes (Signed)
Unable to fully assess - pt does not answer all questions

## 2020-01-25 NOTE — Progress Notes (Addendum)
Hypoglycemic Event  CBG: 57  Treatment: D50 25 mL (12.5 gm)  Symptoms: None  Follow-up CBG: Time: 0845 CBG Result: 141  Possible Reasons for Event: Unknown  Comments/MD notified: Dr Starla Link made aware on rounds.     Derek Jack

## 2020-01-25 NOTE — Op Note (Signed)
[facility] Patient Name: Chloe Harding Procedure Date: 01/25/2020 MRN: [id number] Attending MD: Carol Ada , MD Date of Birth: 1945/10/23 CSN: [id number] Age: 74 Admit Type: Inpatient Procedure:                Upper GI endoscopy Indications:              Dysphagia, Heme positive stool, Melena Providers:                Carol Ada, MD, Carmie End, RN, Tyna Jaksch Technician, Caryl Pina CRNA Referring MD:              Medicines:                Propofol per Anesthesia Complications:            No immediate complications. Estimated Blood Loss:     Estimated blood loss was minimal. Procedure:                Pre-Anesthesia Assessment:                           - Prior to the procedure, a History and Physical                            was performed, and patient medications and                            allergies were reviewed. The patient's tolerance of                            previous anesthesia was also reviewed. The risks                            and benefits of the procedure and the sedation                            options and risks were discussed with the patient.                            All questions were answered, and informed consent                            was obtained. Prior Anticoagulants: The patient has                            taken no previous anticoagulant or antiplatelet                            agents. ASA Grade Assessment: III - A patient with                            severe systemic disease. After reviewing the risks  and benefits, the patient was deemed in                            satisfactory condition to undergo the procedure.                           - Sedation was administered by an anesthesia                            professional. Deep sedation was attained.                           After obtaining informed consent, the endoscope was                             passed under direct vision. Throughout the                            procedure, the patient's blood pressure, pulse, and                            oxygen saturations were monitored continuously. The                            [id number]-[id number] ([id number]) was introduced through the                            mouth, and advanced to the second part of duodenum.                            The upper GI endoscopy was accomplished without                            difficulty. The patient tolerated the procedure                            well. Scope In: Scope Out: Findings:      One benign-appearing, intrinsic mild stenosis was found 22 cm from the       incisors. This stenosis measured 1.5 cm (inner diameter) x 1 cm (in       length). The stenosis was traversed. A TTS dilator was passed through       the scope. Dilation with a 15-16.5-18 mm balloon dilator was performed       to 18 mm. The dilation site was examined and showed mild mucosal       disruption and mild improvement in luminal narrowing. Estimated blood       loss was minimal.      One cratered esophageal ulcer with no stigmata of recent bleeding was       found in the lower third of the esophagus. The lesion was 15 mm in       largest dimension.      LA Grade D (one or more mucosal breaks involving at least 75% of       esophageal circumference) esophagitis with no bleeding was found in the  lower third of the esophagus.      A 4 cm hiatal hernia was present.      The stomach was normal.      The examined duodenum was normal.      The proximal esophageal stricture was patent. With the complaint of       dysphagia dilation was performed starting at 15 mm. The balloon was able       to be moved freely and then sequential dilation was increased to 16.5 mm       and then 18 mm. Mucosal disruption was achieved. The distal esophagus       displayed a large ulceration and friability with the surrounding mucosa. Impression:                - Benign-appearing esophageal stenosis. Dilated.                           - Esophageal ulcer with no stigmata of recent                            bleeding.                           - LA Grade D reflux esophagitis with no bleeding.                           - 4 cm hiatal hernia.                           - Normal stomach.                           - Normal examined duodenum.                           - No specimens collected. Moderate Sedation:      Not Applicable - Patient had care per Anesthesia. Recommendation:           - Return patient to hospital ward for ongoing care.                           - Advance diet as tolerated.                           - Continue present medications.                           - Increase pantoprazole to 40 mg BID, indefinitely.                           - Sucralfate 1 gram TID x 1 month. Procedure Code(s):        --- Professional ---                           [phone], Esophagogastroduodenoscopy, flexible,                            transoral; with transendoscopic balloon dilation of  esophagus (less than 30 mm diameter) Diagnosis Code(s):        --- Professional ---                           K22.2, Esophageal obstruction                           K22.10, Ulcer of esophagus without bleeding                           K21.00, Gastro-esophageal reflux disease with                            esophagitis, without bleeding                           K44.9, Diaphragmatic hernia without obstruction or                            gangrene                           R13.10, Dysphagia, unspecified                           R19.5, Other fecal abnormalities                           K92.1, Melena (includes Hematochezia) CPT copyright 2019 American Medical Association. All rights reserved. The codes documented in this report are preliminary and upon coder review may  be revised to meet current compliance requirements. Carol Ada,  MD Carol Ada, MD 01/25/2020 1:00:20 PM This report has been signed electronically. Number of Addenda: 0

## 2020-01-25 NOTE — Progress Notes (Addendum)
Hypoglycemic Event  CBG: 64 @ 0745  Treatment: 4 oz juice/soda  Symptoms: None  Follow-up CBG: Time: 0818 CBG Result: 69> got pt to drink remaining juice and recheck   Possible Reasons for Event: Unknown  Comments/MD notified: Dr. Starla Link made aware. See new order enter by MD    Derek Jack

## 2020-01-25 NOTE — Progress Notes (Signed)
PT Cancellation Note  Patient Details Name: SHUNDRA WIRSING MRN: [id number] DOB: [birth year]   Cancelled Treatment:      pt out of room for an UPPER ENDOSCOPY.  Will attempt to see tomorrow.   Nathanial Rancher 01/25/2020, 2:35 PM

## 2020-01-25 NOTE — Transfer of Care (Signed)
Immediate Anesthesia Transfer of Care Note  Patient: Chloe Harding  Procedure(s) Performed: ESOPHAGOGASTRODUODENOSCOPY (EGD) WITH PROPOFOL (N/A ) BALLOON DILATION (N/A )  Patient Location: Endoscopy Unit  Anesthesia Type:MAC  Level of Consciousness: drowsy and responds to stimulation  Airway & Oxygen Therapy: Patient Spontanous Breathing and Patient connected to face mask oxygen  Post-op Assessment: Report given to RN, Post -op Vital signs reviewed and stable and Patient moving all extremities  Post vital signs: Reviewed and stable  Last Vitals:  Vitals Value Taken Time  BP 112/39 01/25/20 1252  Temp    Pulse 96 01/25/20 1254  Resp 24 01/25/20 1254  SpO2 100 % 01/25/20 1254  Vitals shown include unvalidated device data.  Last Pain:  Vitals:   01/25/20 1114  TempSrc: Oral  PainSc: 0-No pain         Complications: No complications documented.

## 2020-01-25 NOTE — Consult Note (Signed)
Palliative care consult note  Reason for consult: Goals of care  Palliative care consult received.  Chart reviewed including personal review of pertinent labs and imaging.  Briefly, Chloe Harding is a 74 year old female with past medical history of type 2 diabetes, CHF, chronic anemia, hyperlipidemia, hypertension and recent multiple hospitalizations who presented with confusion and multiple falls at home.  ED CT was unremarkable but subsequent MRI revealed a subdural hematoma without mass-effect or infarct.  Repeat imaging revealed this is stable.  Neurology recommended week course of Keppra and EEG did not show any seizures.  She did have a decrease in hemoglobin from 12 on admission to 7.6.  Plan for EGD today per GI.  She continues to remain encephalopathic with consideration likely metabolic or delirium in etiology.  She is followed by outpatient palliative care through [facility].  Palliative care consulted for goals of care.  I saw and examined Chloe Harding today.  She was sleeping on entering the room but aroused easily.  She was confused and not able to participate in any conversation regarding goals of care.  I attempted to call patient's husband at number listed.  I did not reach him.  I then called and was able to reach her daughter, Chloe Harding.  Chloe Harding reports that she works closely with her father to help him understand things related to her mom's condition but he ultimately makes decisions regarding her care plan after getting input from the rest of family.  We discussed the continued changes family has been seeing in Chloe Harding's cognition, nutrition, and functional status.  Chloe Harding and I discussed that the hospital can be useful as long as her mom is getting well enough from care she receives at the hospital to enjoy his time at home, but the declines we are seeing in her nutrition, cognition, and functional status are very concerning.  She is in agreement that a good plan is to work  to transition to rehab as they have previously been arranging. She has not yet attempted rehab at this point to see how much function she can regain. If she does well and continues to thrive, I encouraged they continue with this plan. If, however she is unable to regain function and she continues to decline, we discussed that she will continue to be followed by outpatient palliative care to best determine next steps in caring for her at that point.  We discussed regarding limitations of care and code status. Chloe Harding reports that they have been talking about this with a relative in healthcare and have talked about living wills and consideration for DNR status. She reports that family needs further time to consider this, but are open to follow-up at SNF.  - Full code/full scope.  Family in conversation regarding possible limitations of care. - Recommend palliative care follow-up at SNF for continuation of conversation regarding goals of care.  Start time: 1010 End time: 1110  Total time: 60 minutes Greater than 50%  of this time was spent counseling and coordinating care related to the above assessment and plan.  Micheline Rough, MD [facility] [phone]

## 2020-01-25 NOTE — Anesthesia Preprocedure Evaluation (Signed)
Anesthesia Evaluation  Patient identified by MRN, date of birth, ID band Patient awake    Reviewed: Allergy & Precautions, NPO status , Patient's Chart, lab work & pertinent test results, Unable to perform ROS - Chart review only  Airway Mallampati: II  TM Distance: >3 FB Neck ROM: Full    Dental  (+) Edentulous Upper, Missing, Partial Lower   Pulmonary pneumonia,    breath sounds clear to auscultation       Cardiovascular hypertension, + CAD and +CHF   Rhythm:Regular Rate:Normal     Neuro/Psych PSYCHIATRIC DISORDERS Depression    GI/Hepatic hiatal hernia, GERD  ,  Endo/Other  diabetes  Renal/GU Renal disease     Musculoskeletal  (+) Arthritis ,   Abdominal   Peds  Hematology  (+) anemia ,   Anesthesia Other Findings   Reproductive/Obstetrics                             Anesthesia Physical  Anesthesia Plan  ASA: III  Anesthesia Plan: MAC   Post-op Pain Management:    Induction: Intravenous  PONV Risk Score and Plan: Ondansetron, Propofol infusion, TIVA and Treatment may vary due to age or medical condition  Airway Management Planned: Natural Airway  Additional Equipment: None  Intra-op Plan:   Post-operative Plan:   Informed Consent: I have reviewed the patients History and Physical, chart, labs and discussed the procedure including the risks, benefits and alternatives for the proposed anesthesia with the patient or authorized representative who has indicated his/her understanding and acceptance.     Dental advisory given  Plan Discussed with: CRNA  Anesthesia Plan Comments: (Attempted to call with both daughter and husband, but was unable to reach.)        Anesthesia Quick Evaluation

## 2020-01-25 NOTE — Interval H&P Note (Signed)
History and Physical Interval Note:  01/25/2020 12:19 PM  Chloe Harding  has presented today for surgery, with the diagnosis of Dysphagia and anemia.  The various methods of treatment have been discussed with the patient and family. After consideration of risks, benefits and other options for treatment, the patient has consented to  Procedure(s): ESOPHAGOGASTRODUODENOSCOPY (EGD) WITH PROPOFOL (N/A) as a surgical intervention.  The patient's history has been reviewed, patient examined, no change in status, stable for surgery.  I have reviewed the patient's chart and labs.  Questions were answered to the patient's satisfaction.     Rusty Glodowski D

## 2020-01-25 NOTE — Progress Notes (Signed)
SLP Cancellation Note  Patient Details Name: Chloe Harding MRN: [id number] DOB: 1945-12-18   Cancelled treatment:       Reason Eval/Treat Not Completed: Other (comment) (pt undergoing endoscopy)  Kathleen Lime, MS [facility] SLP Acute Rehab Services Office [phone]  Macario Golds 01/25/2020, 11:51 AM

## 2020-01-25 NOTE — Progress Notes (Signed)
[employer] [facility]) Community Based Palliative Care  This patient is enrolled in our palliative care services in the community.ACC will continue to follow for any discharge planning needs and to coordinate continuation of palliative care. If you have questions or need assistance, please call [phone] or contact the hospital Liaison listed on [facility].  Thank you for the opportunity to participate in this patient's care.  Farrel Gordon, RN, CCM  [facility] Liaison (listed on [facility] under [facility])    [phone]

## 2020-01-26 DIAGNOSIS — I10 Essential (primary) hypertension: Secondary | ICD-10-CM

## 2020-01-26 LAB — CBC WITH DIFFERENTIAL/PLATELET
Abs Immature Granulocytes: 0.08 10*3/uL — ABNORMAL HIGH (ref 0.00–0.07)
Basophils Absolute: 0 10*3/uL (ref 0.0–0.1)
Basophils Relative: 1 %
Eosinophils Absolute: 0 10*3/uL (ref 0.0–0.5)
Eosinophils Relative: 0 %
HCT: 30.1 % — ABNORMAL LOW (ref 36.0–46.0)
Hemoglobin: 10 g/dL — ABNORMAL LOW (ref 12.0–15.0)
Immature Granulocytes: 2 %
Lymphocytes Relative: 44 %
Lymphs Abs: 2.3 10*3/uL (ref 0.7–4.0)
MCH: 27.9 pg (ref 26.0–34.0)
MCHC: 33.2 g/dL (ref 30.0–36.0)
MCV: 84.1 fL (ref 80.0–100.0)
Monocytes Absolute: 0.5 10*3/uL (ref 0.1–1.0)
Monocytes Relative: 9 %
Neutro Abs: 2.4 10*3/uL (ref 1.7–7.7)
Neutrophils Relative %: 44 %
Platelets: 265 10*3/uL (ref 150–400)
RBC: 3.58 MIL/uL — ABNORMAL LOW (ref 3.87–5.11)
RDW: 15.7 % — ABNORMAL HIGH (ref 11.5–15.5)
WBC: 5.3 10*3/uL (ref 4.0–10.5)
nRBC: 0 % (ref 0.0–0.2)

## 2020-01-26 LAB — BASIC METABOLIC PANEL
Anion gap: 5 (ref 5–15)
BUN: <5 mg/dL — ABNORMAL LOW (ref 8–23)
CO2: 23 mmol/L (ref 22–32)
Calcium: 7.6 mg/dL — ABNORMAL LOW (ref 8.9–10.3)
Chloride: 109 mmol/L (ref 98–111)
Creatinine, Ser: 0.7 mg/dL (ref 0.44–1.00)
GFR calc Af Amer: >60 mL/min (ref >60)
GFR calc non Af Amer: >60 mL/min (ref >60)
Glucose, Bld: 90 mg/dL (ref 70–99)
Potassium: 3.8 mmol/L (ref 3.5–5.1)
Sodium: 137 mmol/L (ref 135–145)

## 2020-01-26 LAB — GLUCOSE, CAPILLARY
Glucose-Capillary: 72 mg/dL (ref 70–99)
Glucose-Capillary: 88 mg/dL (ref 70–99)

## 2020-01-26 LAB — MAGNESIUM: Magnesium: 1.7 mg/dL (ref 1.7–2.4)

## 2020-01-26 MED ORDER — LEVETIRACETAM 500 MG PO TABS: 500.0000 mg | ORAL_TABLET | Freq: Two times a day (BID) | ORAL | 0 refills | Status: DC

## 2020-01-26 MED ORDER — OFLOXACIN 0.3 % OP SOLN
1.0000 [drp] | Freq: Four times a day (QID) | OPHTHALMIC | Status: DC
  Administered 2020-01-26: 1 [drp] via OPHTHALMIC
  Filled 2020-01-26: qty 5

## 2020-01-26 MED ORDER — SUCRALFATE 1 G PO TABS: 1.0000 g | ORAL_TABLET | Freq: Three times a day (TID) | ORAL | 0 refills | Status: DC

## 2020-01-26 MED ORDER — OFLOXACIN 0.3 % OP SOLN: 1.0000 [drp] | Freq: Four times a day (QID) | OPHTHALMIC | 0 refills | Status: DC

## 2020-01-26 NOTE — TOC Transition Note (Signed)
Transition of Care [facility]) - CM/SW Discharge Note   Patient Details  Name: Chloe Harding MRN: [id number] Date of Birth: 1945/08/07  [facility]) CM/SW Contact:  Lia Hopping, LCSW Phone Number: 01/26/2020, 10:15 AM   Clinical Narrative:    CSW confirm SNF-[facility] is ready to accept the patient.  Nurse call report to: [phone] Room: 301A "ask for [facility] [place]." PTAR to transport.  CSW notified patient daughter Chloe Harding.    Final next level of care: Skilled Nursing Facility Barriers to Discharge: Continued Medical Work up   Patient Goals and CMS Choice   CMS Medicare.gov Compare Post Acute Care list provided to:: Other (Comment Required) Choice offered to / list presented to : Adult Children  Discharge Placement PASRR number recieved: 01/24/20            Patient chooses bed at: [facility] Patient to be transferred to facility by: [place] Name of family member notified: Daughter Chloe Harding Patient and family notified of of transfer: 01/26/20  Discharge Plan and Services In-house Referral: Clinical Social Work Discharge Planning Services: NA Post Acute Care Choice: [facility]          DME Arranged: N/A         HH Arranged: NA [place] Agency: NA        Social Determinants of Health (SDOH) Interventions     Readmission Risk Interventions No flowsheet data found.

## 2020-01-26 NOTE — Anesthesia Postprocedure Evaluation (Signed)
Anesthesia Post Note  Patient: Chloe Harding  Procedure(s) Performed: ESOPHAGOGASTRODUODENOSCOPY (EGD) WITH PROPOFOL (N/A ) BALLOON DILATION (N/A )     Patient location during evaluation: PACU Anesthesia Type: MAC Level of consciousness: awake and alert Pain management: pain level controlled Vital Signs Assessment: post-procedure vital signs reviewed and stable Respiratory status: spontaneous breathing Cardiovascular status: stable Anesthetic complications: no   No complications documented.  Last Vitals:  Vitals:   01/25/20 2126 01/26/20 0602  BP: (!) 159/75 (!) 156/86  Pulse: 91 87  Resp: 14 16  Temp: 37.4 C 36.6 C  SpO2: 97% 98%    Last Pain:  Vitals:   01/25/20 2209  TempSrc:   PainSc: 0-No pain                 Nolon Nations

## 2020-01-26 NOTE — Discharge Summary (Addendum)
Physician Discharge Summary  Chloe Harding BPZ:[id number] DOB: Jan 10, 1946 DOA: 01/18/2020  PCP: [name], NP  Admit date: 01/18/2020 Discharge date: 01/26/2020  Admitted From: Home Disposition: SNF  Recommendations for Outpatient Follow-up:  1. Follow up with SNF provider at earliest convenience 2. Outpatient follow-up with palliative care and if condition does not improve, consider changing status to DNR hospice/comfort measures with CBC/BMP  3. Outpatient follow-up with GI/Dr. Benson Norway 4. Outpatient follow-up with neurology 5. follow up in ED if symptoms worsen or new appear   Home Health: No Equipment/Devices: None  Discharge Condition: Guarded to poor  CODE STATUS: Full Diet recommendation: Heart healthy/carb modified  Brief/Interim Summary:  Discharge Diagnoses:   Acute metabolic encephalopathy: Unclear etiology -Suspecting metabolic. There is no neck stiffness. No fever, UTI, nonfocal exam. -TSH, ammonia, B12 levels normal. MRI and EEG without positive findings. Neurology signed off. -Delirium precautions. -Still very slow to respond with very poor oral intake.  Outpatient follow-up with neurology -Hold other sedative medications including gabapentin/Flexeril/pain medications  Traumatic subdural hematoma -MRI showed 2 mm small hematoma and probable one smaller SDH along the right occiput. Repeat CT head on 01/21/2020 showed no hematoma. -Continue prophylactic Keppra for 7 days (started 01/21/2020 p.m.). EEG showed no seizures. Neurology signed off -Seizure precaution. Fall precautions. Avoid anticoagulations -PT recommended SNF placement.   Constipation -CT abdomen showed impacted stool. Treated with laxatives. Continue bowel regimen.  Dysphagia, history of esophageal strictures Anemia of chronic disease, heme positive stool -Avoid anticoagulation/antiplatelets. Continue PPI. Hemoglobin currently at baseline. Monitor. FOBT positive although no gross bleeding  noted -Status post EGD with dilation on 01/25/2020.  GI recommends PPI twice a day indefinitely along with sucralfate 1 month.  Outpatient follow-up with GI.  GI has signed off.  AKI: Resolved with IV fluids  Diabetes mellitus type 2 with hypoglycemia -Currently on dextrose drip.  Hold any form of insulin upon discharge for now.  Outpatient follow-up.  Hypokalemia -Treated.  No labs today.  Hypomagnesemia -Replaced.  No labs today.  Chronic systolic and diastolic CHF -Currently compensated.  Continue sodium restriction and fluid restriction. Continue metoprolol.  Resume Lasix.  Hypothyroidism -Continue Synthroid  Depression -Continue Lexapro  Urinary retention -Continue Foley catheter  Generalized deconditioning -PT recommends SNF placement. Currently listed as full code although patient follows up with palliative care as an outpatient. palliative care evaluation appreciated.  Recommended outpatient palliative care follow-up.  If condition were to worsen, consider hospice/comfort measures  Discharge Instructions  Discharge Instructions    Ambulatory referral to Neurology   Complete by: As directed    An appointment is requested in approximately: 1-2 weeks for hospital followup of encephalopathy   Diet - low sodium heart healthy   Complete by: As directed    Diet Carb Modified   Complete by: As directed    Discharge wound care:   Complete by: As directed    Local wound care. Consider wound care consult if no improvement   Increase activity slowly   Complete by: As directed      Allergies as of 01/26/2020      Reactions   Amlodipine Besylate Swelling   swelling      Medication List    STOP taking these medications   amitriptyline 25 MG tablet Commonly known as: ELAVIL   cyclobenzaprine 10 MG tablet Commonly known as: FLEXERIL   gabapentin 400 MG capsule Commonly known as: NEURONTIN   HYDROcodone-acetaminophen 5-325 MG tablet Commonly known as:  NORCO/VICODIN   Januvia 100 MG  tablet Generic drug: sitaGLIPtin     TAKE these medications   atorvastatin 20 MG tablet Commonly known as: LIPITOR Take 1 tablet (20 mg total) by mouth daily.   bisacodyl 5 MG EC tablet Commonly known as: DULCOLAX Take 1 tablet (5 mg total) by mouth daily as needed for moderate constipation.   Constulose 10 GM/15ML solution Generic drug: lactulose Take 10 g by mouth daily as needed for mild constipation.   escitalopram 10 MG tablet Commonly known as: LEXAPRO Take 10 mg by mouth daily.   ferrous sulfate 325 (65 FE) MG tablet Take 1 tablet (325 mg total) by mouth 2 (two) times daily with a meal.   folic acid 1 MG tablet Commonly known as: FOLVITE Take 1 mg by mouth daily.   furosemide 40 MG tablet Commonly known as: LASIX Take 1 tablet (40 mg total) by mouth daily.   hydroxychloroquine 200 MG tablet Commonly known as: PLAQUENIL Take 200 mg by mouth daily.   levETIRAcetam 500 MG tablet Commonly known as: KEPPRA Take 1 tablet (500 mg total) by mouth 2 (two) times daily for 2 days.   metoprolol tartrate 25 MG tablet Commonly known as: LOPRESSOR Take 1 tablet (25 mg total) by mouth 2 (two) times daily.   ofloxacin 0.3 % ophthalmic solution Commonly known as: OCUFLOX Place 1 drop into both eyes 4 (four) times daily. For 7 days   pantoprazole 40 MG tablet Commonly known as: Protonix Take 1 tablet (40 mg total) by mouth 2 (two) times daily.   polyethylene glycol 17 g packet Commonly known as: MIRALAX / GLYCOLAX Take 17 g by mouth daily as needed for moderate constipation or severe constipation.   potassium chloride 10 MEQ tablet Commonly known as: KLOR-CON Take 10 mEq by mouth daily.   ropinirole 5 MG tablet Commonly known as: REQUIP Take 5 mg by mouth at bedtime.   senna-docusate 8.6-50 MG tablet Commonly known as: Senokot-S Take 1 tablet by mouth at bedtime.   sucralfate 1 g tablet Commonly known as: CARAFATE Take 1  tablet (1 g total) by mouth 4 (four) times daily -  with meals and at bedtime.   Vitamin D3 25 MCG tablet Commonly known as: Vitamin D Take 1 tablet (1,000 Units total) by mouth daily.            Discharge Care Instructions  (From admission, onward)         Start     Ordered   01/26/20 0000  Discharge wound care:       Comments: Local wound care. Consider wound care consult if no improvement   01/26/20 1005           Follow-up Information    [name], NP. Schedule an appointment as soon as possible for a visit in 1 week(s).   Specialty: Nurse Practitioner Why: with repeat cbc/bmp Contact information: [place] [address] [phone]        [name], MD .   Specialty: Cardiology Contact information: [address] [place] [address] [phone]        Carol Ada, MD. Schedule an appointment as soon as possible for a visit in 1 week(s).   Specialty: Gastroenterology Contact information: [place], [place] [address] [phone]              Allergies  Allergen Reactions  . Amlodipine Besylate Swelling    swelling    Consultations:  Neurology/palliative care/GI   Procedures/Studies:  CT ABDOMEN PELVIS WO CONTRAST  Result Date: 01/19/2020 CLINICAL DATA:  Generalized abdominal pain. EXAM: CT ABDOMEN AND PELVIS WITHOUT CONTRAST TECHNIQUE: Multidetector CT imaging of the abdomen and pelvis was performed following the standard protocol without IV contrast. COMPARISON:  December 23, 2019. FINDINGS: Lower chest: Small bilateral pleural effusions are noted. Right posterior basilar atelectasis or infiltrate is noted. Hepatobiliary: No focal liver abnormality is seen. Status post cholecystectomy. No biliary dilatation. Pancreas: Unremarkable. No pancreatic ductal dilatation or surrounding inflammatory changes. Spleen: Normal in size without focal abnormality. Adrenals/Urinary Tract:  Adrenal glands are unremarkable. Kidneys are normal, without renal calculi, focal lesion, or hydronephrosis. Bladder is unremarkable. Stomach/Bowel: The stomach appears normal. There is no evidence of bowel obstruction. The appendix is not visualized. Sigmoid diverticulosis is noted. There is large amount of stool seen in the distal sigmoid colon and rectum concerning for impaction. Mild wall thickening of the rectum is noted with surrounding inflammatory changes concerning for proctitis. Vascular/Lymphatic: Aortic atherosclerosis. No enlarged abdominal or pelvic lymph nodes. Reproductive: Uterus and bilateral adnexa are unremarkable. Other: No abdominal wall hernia or abnormality. No abdominopelvic ascites. Musculoskeletal: No acute or significant osseous findings. IMPRESSION: 1. Small bilateral pleural effusions are noted with right posterior basilar atelectasis or infiltrate. 2. Sigmoid diverticulosis without inflammation. 3. Large amount of stool seen in the distal sigmoid colon and rectum concerning for impaction. Mild wall thickening of the rectum is noted with surrounding inflammatory changes concerning for proctitis. 4. Aortic atherosclerosis. Aortic Atherosclerosis (ICD10-I70.0). Electronically Signed   By: Marijo Conception M.D.   On: 01/19/2020 12:02   DG Abd 1 View  Result Date: 01/21/2020 CLINICAL DATA:  Fecal impaction EXAM: ABDOMEN - 1 VIEW COMPARISON:  CT abdomen/pelvis dated 01/19/2020 FINDINGS: Nonobstructive bowel gas pattern. Normal colonic stool burden. However, there is moderate stool in the rectal vault, compatible with the clinical history of fecal impaction. Cholecystectomy clips. Degenerative changes of the lumbar spine. IMPRESSION: Moderate stool in the rectal vault, compatible with the clinical history of fecal impaction. Electronically Signed   By: Julian Hy M.D.   On: 01/21/2020 04:39   CT HEAD WO CONTRAST  Result Date: 01/21/2020 CLINICAL DATA:  Altered mental status. EXAM:  CT HEAD WITHOUT CONTRAST TECHNIQUE: Contiguous axial images were obtained from the base of the skull through the vertex without intravenous contrast. COMPARISON:  January 18, 2020. FINDINGS: Brain: Mild diffuse cortical atrophy is noted. Mild chronic ischemic white matter disease is noted. No mass effect or midline shift is noted. Ventricular size is within normal limits. There is no evidence of mass lesion, hemorrhage or acute infarction. Vascular: No hyperdense vessel or unexpected calcification. Skull: Normal. Negative for fracture or focal lesion. Sinuses/Orbits: No acute finding. Other: None. IMPRESSION: Mild diffuse cortical atrophy. Mild chronic ischemic white matter disease. No acute intracranial abnormality seen. Electronically Signed   By: Marijo Conception M.D.   On: 01/21/2020 09:58   CT Head Wo Contrast  Result Date: 01/18/2020 CLINICAL DATA:  Mental status change EXAM: CT HEAD WITHOUT CONTRAST TECHNIQUE: Contiguous axial images were obtained from the base of the skull through the vertex without intravenous contrast. COMPARISON:  None. FINDINGS: Brain: No evidence of acute territorial infarction, hemorrhage, hydrocephalus,extra-axial collection or mass lesion/mass effect. There is low-attenuation changes in the deep white matter consistent with small vessel ischemia. Ventricles are normal in size and contour. Vascular: No hyperdense vessel or unexpected calcification. Skull: The skull is intact. No fracture or focal lesion identified. Sinuses/Orbits: Small amount of fluid seen  within the left maxillary sinus and right sphenoid sinus. The orbits and globes intact. Other: None IMPRESSION: No acute intracranial abnormality. Findings consistent chronic small vessel ischemia Electronically Signed   By: Prudencio Pair M.D.   On: 01/18/2020 22:13   MR BRAIN WO CONTRAST  Result Date: 01/20/2020 CLINICAL DATA:  Mental status change with unknown cause. Multiple recent falls. EXAM: MRI HEAD WITHOUT CONTRAST  TECHNIQUE: Multiplanar, multiecho pulse sequences of the brain and surrounding structures were obtained without intravenous contrast. COMPARISON:  This head CT from 2 days ago FINDINGS: Brain: Trace subdural hemorrhage along the left cerebral convexity, mainly inferiorly, only 2 mm in maximal thickness. A trace right occipital subdural collection could also be present. Head CT follow-up in 24 hours could be considered. No acute infarct, hydrocephalus, or masslike finding. Moderate chronic small vessel ischemia in the periventricular white matter and pons. Vascular: Normal flow voids. Skull and upper cervical spine: Normal marrow signal Sinuses/Orbits: No acute finding.  Bilateral cataract resection Other: These results were called by telephone at the time of interpretation on 01/20/2020 at 12:45 pm to provider [facility] , who verbally acknowledged these results. IMPRESSION: 1. Thin subdural hematoma along the left cerebral convexity (only 2 mm in thickness). This was not seen on head CT from 2 days ago but may have been too thin to detect by CT. No mass effect. 2. Probable even thinner subdural hematoma along the right occipital convexity. 3. No acute infarct. 4. Moderate motion artifact. Electronically Signed   By: Monte Fantasia M.D.   On: 01/20/2020 12:53   DG Chest Port 1 View  Result Date: 01/18/2020 CLINICAL DATA:  Altered mental status, status post fall. EXAM: PORTABLE CHEST 1 VIEW COMPARISON:  December 23, 2019 FINDINGS: Decreased lung volumes are seen which is likely, in part, secondary to the degree of patient inspiration. There is no evidence of acute infiltrate, pleural effusion or pneumothorax. Numerous radiopaque overlying cardiac lead wires are present. The heart size and mediastinal contours are within normal limits. There is marked severity calcification of the aortic arch. No acute osseous abnormalities are identified. IMPRESSION: Low lung volumes without evidence of acute or active  cardiopulmonary disease. Electronically Signed   By: Virgina Norfolk M.D.   On: 01/18/2020 22:18   EEG adult  Result Date: 01/22/2020 Lora Havens, MD     01/22/2020  3:59 PM Patient Name: CHONTE RICKE MRN: [id number] Epilepsy Attending: Lora Havens Referring Physician/Provider: Dr Donnetta Simpers Date: 01/22/2020 Duration: 24.45 mins Patient history: 74 y.o. female with PMH significant for diabetes mellitus, CHF, chronic anemia, hyperlipidemia, hypertension  who presents with AMS and multiple falls. CTH showed no acute intracranial abnormality. MR Brain showed trace subdural hemorrhage along the left cerebral convexity, mainly inferiorly, only 2 mm in maximal thickness. A trace right occipital subdural collection could also be present. EEG to evaluate for seizure.  Level of alertness: Awake AEDs during EEG study: LEV Technical aspects: This EEG study was done with scalp electrodes positioned according to the 10-20 International system of electrode placement. Electrical activity was acquired at a sampling rate of 500Hz  and reviewed with a high frequency filter of 70Hz  and a low frequency filter of 1Hz . EEG data were recorded continuously and digitally stored. Description: No posterior dominant rhythm was seen. EEG showed continuous generalized 3 to 6 Hz theta-delta slowing. 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 Hyperventilation and photic stimulation were not performed.   ABNORMALITY -Continuous slow, generalized -Periodic  epileptiform discharges with triphasic morphology , generalized IMPRESSION: This study is suggestive of moderate to severe diffuse encephalopathy, nonspecific etiology but likely related to toxic-metabolic etiology. 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  No seizures were seen throughout the  recording. [name]   DG HIPS BILAT WITH PELVIS 3-4 VIEWS  Result Date: 01/18/2020 CLINICAL DATA:  Status post fall. EXAM: DG HIP (WITH OR WITHOUT PELVIS) 3-4V BILAT COMPARISON:  None. FINDINGS: There is no evidence of hip fracture or dislocation. There is no evidence of arthropathy or other focal bone abnormality. IMPRESSION: Negative. Electronically Signed   By: Virgina Norfolk M.D.   On: 01/18/2020 22:16    EGD with dilation on 01/25/2020   Subjective: Patient seen and examined at bedside.  She is sleepy, wakes up slightly, hardly answers any questions, still confused.  No overnight fever, vomiting, seizures reported.  Discharge Exam: Vitals:   01/25/20 2126 01/26/20 0602  BP: (!) 159/75 (!) 156/86  Pulse: 91 87  Resp: 14 16  Temp: 99.3 F (37.4 C) 97.8 F (36.6 C)  SpO2: 97% 98%    General: Pt is chronically ill looking.  Sleepy, wakes up slightly, hardly answers any questions.  Still confused. Cardiovascular: rate controlled, S1/S2 + Respiratory: bilateral decreased breath sounds at bases with some scattered crackles Abdominal: Soft, NT, ND, bowel sounds + Extremities: Trace lower extremity edema; no cyanosis    The results of significant diagnostics from this hospitalization (including imaging, microbiology, ancillary and laboratory) are listed below for reference.     Microbiology: Recent Results (from the past 240 hour(s))  Culture, blood (Routine X 2) w Reflex to ID Panel     Status: None   Collection Time: 01/18/20  8:20 PM   Specimen: BLOOD  Result Value Ref Range Status   Specimen Description   Final    BLOOD LEFT ANTECUBITAL Performed at [facility] [address] [place], [place] [address]    Special Requests   Final    BOTTLES DRAWN AEROBIC AND ANAEROBIC Blood Culture adequate volume Performed at [facility] [address] [place], [place] [address]    Culture   Final    NO GROWTH 5 DAYS Performed at  [facility], [place] [address] [place], [place] [address]    Report Status 01/24/2020 FINAL  Final  SARS Coronavirus 2 by RT PCR (hospital order, performed in [facility] hospital lab) Nasopharyngeal Nasopharyngeal Swab     Status: None   Collection Time: 01/18/20  9:37 PM   Specimen: Nasopharyngeal Swab  Result Value Ref Range Status   SARS Coronavirus 2 NEGATIVE NEGATIVE Final    Comment: (NOTE) SARS-CoV-2 target nucleic acids are NOT DETECTED.  The SARS-CoV-2 RNA is generally detectable in upper and lower respiratory specimens during the acute phase of infection. The lowest concentration of SARS-CoV-2 viral copies this assay can detect is 250 copies / mL. A negative result does not preclude SARS-CoV-2 infection and should not be used as the sole basis for treatment or other patient management decisions.  A negative result may occur with improper specimen collection / handling, submission of specimen other than nasopharyngeal swab, presence of viral mutation(s) within the areas targeted by this assay, and inadequate number of viral copies (<250 copies / mL). A negative result must be combined with clinical observations, patient history, and epidemiological information.  Fact  Sheet for Patients:   StrictlyIdeas.no  Fact Sheet for Healthcare Providers: BankingDealers.co.za  This test is not yet approved or  cleared by the [place] FDA and has been authorized for detection and/or diagnosis of SARS-CoV-2 by FDA under an Emergency Use Authorization (EUA).  This EUA will remain in effect (meaning this test can be used) for the duration of the COVID-19 declaration under Section 564(b)(1) of the Act, 21 U.S.C. section 360bbb-3(b)(1), unless the authorization is terminated or revoked sooner.  Performed at [facility], [place] [address] [place], [place] [address]   Culture, blood (Routine X 2) w Reflex to ID  Panel     Status: None   Collection Time: 01/19/20  3:30 AM   Specimen: BLOOD  Result Value Ref Range Status   Specimen Description   Final    BLOOD LEFT HAND Performed at [facility] [address], [place], [place] [address]    Special Requests   Final    BOTTLES DRAWN AEROBIC AND ANAEROBIC Blood Culture adequate volume Performed at [facility] [address] [place], [place] [address]    Culture   Final    NO GROWTH 5 DAYS Performed at [facility], [place] [address] [place], [place] [address]    Report Status 01/24/2020 FINAL  Final  Culture, Urine     Status: None   Collection Time: 01/19/20 10:36 AM   Specimen: Urine, Clean Catch  Result Value Ref Range Status   Specimen Description   Final    URINE, CLEAN CATCH Performed at [facility], [place] [address], [place], [place] [address]    Special Requests   Final    NONE Performed at [facility], [place] [address] [place], [place] [address]    Culture   Final    NO GROWTH Performed at [facility], [place] [address], [place], [place] [address]    Report Status 01/20/2020 FINAL  Final  SARS Coronavirus 2 by RT PCR (hospital order, performed in [facility] hospital lab) Nasopharyngeal Nasopharyngeal Swab     Status: None   Collection Time: 01/25/20  5:17 PM   Specimen: Nasopharyngeal Swab  Result Value Ref Range Status   SARS Coronavirus 2 NEGATIVE NEGATIVE Final    Comment: (NOTE) SARS-CoV-2 target nucleic acids are NOT DETECTED.  The SARS-CoV-2 RNA is generally detectable in upper and lower respiratory specimens during the acute phase of infection. The lowest concentration of SARS-CoV-2 viral copies this assay can detect is 250 copies / mL. A negative result does not preclude SARS-CoV-2 infection and should not be used as the sole basis for treatment or other patient management decisions.  A negative result may occur with improper  specimen collection / handling, submission of specimen other than nasopharyngeal swab, presence of viral mutation(s) within the areas targeted by this assay, and inadequate number of viral copies (<250 copies / mL). A negative result must be combined with clinical observations, patient history, and epidemiological information.  Fact Sheet for Patients:   StrictlyIdeas.no  Fact Sheet for Healthcare Providers: BankingDealers.co.za  This test is not yet approved or  cleared by the [place] FDA and has been authorized for detection and/or diagnosis of SARS-CoV-2 by FDA under an Emergency Use Authorization (EUA).  This EUA will remain in effect (meaning this test can be used) for the duration of the COVID-19 declaration under Section 564(b)(1) of the Act, 21 U.S.C. section 360bbb-3(b)(1), unless the  authorization is terminated or revoked sooner.  Performed at [facility], [place] [address], [place], [place] [address]      Labs: BNP (last 3 results) Recent Labs    11/05/19 1821 11/09/19 0337 01/18/20 2032  BNP 719.2* 2,255.4* 416.6*   Basic Metabolic Panel: Recent Labs  Lab 01/21/20 0315 01/22/20 0410 01/24/20 0341 01/25/20 0334 01/26/20 0340  NA 140 139 135 137 137  K 3.4* 3.8 3.6 3.3* 3.8  CL 110 111 107 109 109  CO2 22 23 23 22 23   GLUCOSE 114* 106* 102* 68* 90  BUN 10 7* <5* <5* <5*  CREATININE 0.70 0.60 0.67 0.67 0.70  CALCIUM 8.5* 7.9* 7.3* 7.6* 7.6*  MG 1.2* 1.8  --  1.3* 1.7  PHOS 1.8* 2.6  --   --   --    Liver Function Tests: No results for input(s): AST, ALT, ALKPHOS, BILITOT, PROT, ALBUMIN in the last 168 hours. No results for input(s): LIPASE, AMYLASE in the last 168 hours. No results for input(s): AMMONIA in the last 168 hours. CBC: Recent Labs  Lab 01/22/20 0410 01/23/20 0312 01/24/20 0341 01/25/20 0334 01/26/20 0340  WBC 4.2 5.3 4.9 4.9 5.3  NEUTROABS  --   --   --  2.2 2.4   HGB 8.1* 8.5* 9.0* 9.4* 10.0*  HCT 25.5* 26.5* 26.4* 28.7* 30.1*  MCV 88.9 86.9 84.9 85.4 84.1  PLT 138* 200 196 225 265   Cardiac Enzymes: No results for input(s): CKTOTAL, CKMB, CKMBINDEX, TROPONINI in the last 168 hours. BNP: Invalid input(s): POCBNP CBG: Recent Labs  Lab 01/25/20 1158 01/25/20 1325 01/25/20 1639 01/25/20 2224 01/26/20 0729  GLUCAP 108* 76 77 80 72   D-Dimer No results for input(s): DDIMER in the last 72 hours. Hgb A1c No results for input(s): HGBA1C in the last 72 hours. Lipid Profile No results for input(s): CHOL, HDL, LDLCALC, TRIG, CHOLHDL, LDLDIRECT in the last 72 hours. Thyroid function studies No results for input(s): TSH, T4TOTAL, T3FREE, THYROIDAB in the last 72 hours.  Invalid input(s): FREET3 Anemia work up No results for input(s): VITAMINB12, FOLATE, FERRITIN, TIBC, IRON, RETICCTPCT in the last 72 hours. Urinalysis    Component Value Date/Time   COLORURINE YELLOW 01/19/2020 1030   APPEARANCEUR CLEAR 01/19/2020 1030   LABSPEC 1.010 01/19/2020 1030   PHURINE 6.0 01/19/2020 1030   GLUCOSEU NEGATIVE 01/19/2020 1030   HGBUR NEGATIVE 01/19/2020 1030   HGBUR trace-intact 05/14/2010 1402   BILIRUBINUR NEGATIVE 01/19/2020 1030   KETONESUR NEGATIVE 01/19/2020 1030   PROTEINUR NEGATIVE 01/19/2020 1030   UROBILINOGEN 1.0 03/30/2015 1540   NITRITE NEGATIVE 01/19/2020 1030   LEUKOCYTESUR NEGATIVE 01/19/2020 1030   Sepsis Labs Invalid input(s): PROCALCITONIN,  WBC,  LACTICIDVEN Microbiology Recent Results (from the past 240 hour(s))  Culture, blood (Routine X 2) w Reflex to ID Panel     Status: None   Collection Time: 01/18/20  8:20 PM   Specimen: BLOOD  Result Value Ref Range Status   Specimen Description   Final    BLOOD LEFT ANTECUBITAL Performed at [facility], [place] [address] [place], [place] [address]    Special Requests   Final    BOTTLES DRAWN AEROBIC AND ANAEROBIC Blood Culture adequate volume Performed at  [place] [address] [place], [place] [address]    Culture   Final    NO GROWTH 5 DAYS Performed at [facility], [place] [address] [place], [place] [address]  Report Status 01/24/2020 FINAL  Final  SARS Coronavirus 2 by RT PCR (hospital order, performed in [facility] hospital lab) Nasopharyngeal Nasopharyngeal Swab     Status: None   Collection Time: 01/18/20  9:37 PM   Specimen: Nasopharyngeal Swab  Result Value Ref Range Status   SARS Coronavirus 2 NEGATIVE NEGATIVE Final    Comment: (NOTE) SARS-CoV-2 target nucleic acids are NOT DETECTED.  The SARS-CoV-2 RNA is generally detectable in upper and lower respiratory specimens during the acute phase of infection. The lowest concentration of SARS-CoV-2 viral copies this assay can detect is 250 copies / mL. A negative result does not preclude SARS-CoV-2 infection and should not be used as the sole basis for treatment or other patient management decisions.  A negative result may occur with improper specimen collection / handling, submission of specimen other than nasopharyngeal swab, presence of viral mutation(s) within the areas targeted by this assay, and inadequate number of viral copies (<250 copies / mL). A negative result must be combined with clinical observations, patient history, and epidemiological information.  Fact Sheet for Patients:   StrictlyIdeas.no  Fact Sheet for Healthcare Providers: BankingDealers.co.za  This test is not yet approved or  cleared by the [place] FDA and has been authorized for detection and/or diagnosis of SARS-CoV-2 by FDA under an Emergency Use Authorization (EUA).  This EUA will remain in effect (meaning this test can be used) for the duration of the COVID-19 declaration under Section 564(b)(1) of the Act, 21 U.S.C. section 360bbb-3(b)(1), unless the authorization is terminated or revoked sooner.  Performed  at [facility], [place] [address], [place], [place] [address]   Culture, blood (Routine X 2) w Reflex to ID Panel     Status: None   Collection Time: 01/19/20  3:30 AM   Specimen: BLOOD  Result Value Ref Range Status   Specimen Description   Final    BLOOD LEFT HAND Performed at [place] [address], [place], [place] [address]    Special Requests   Final    BOTTLES DRAWN AEROBIC AND ANAEROBIC Blood Culture adequate volume Performed at [place] [address] [place], [place] [address]    Culture   Final    NO GROWTH 5 DAYS Performed at [facility], [place] [address], [place], [place] [address]    Report Status 01/24/2020 FINAL  Final  Culture, Urine     Status: None   Collection Time: 01/19/20 10:36 AM   Specimen: Urine, Clean Catch  Result Value Ref Range Status   Specimen Description   Final    URINE, CLEAN CATCH Performed at [facility], [place] [address] [place], [place] [address]    Special Requests   Final    NONE Performed at [facility], [place] [address] [place], [place] [address]    Culture   Final    NO GROWTH Performed at [facility], [place] [address], [place],  [address]    Report Status 01/20/2020 FINAL  Final  SARS Coronavirus 2 by RT PCR (hospital order, performed in [facility] hospital lab) Nasopharyngeal Nasopharyngeal Swab     Status: None   Collection Time: 01/25/20  5:17 PM   Specimen: Nasopharyngeal Swab  Result Value Ref Range Status   SARS Coronavirus 2 NEGATIVE NEGATIVE Final    Comment: (NOTE) SARS-CoV-2 target nucleic acids are NOT DETECTED.  The SARS-CoV-2 RNA is generally detectable in upper and  lower respiratory specimens during the acute phase of infection. The lowest concentration of SARS-CoV-2 viral copies this assay can detect is 250 copies / mL. A negative result does not preclude SARS-CoV-2 infection and should  not be used as the sole basis for treatment or other patient management decisions.  A negative result may occur with improper specimen collection / handling, submission of specimen other than nasopharyngeal swab, presence of viral mutation(s) within the areas targeted by this assay, and inadequate number of viral copies (<250 copies / mL). A negative result must be combined with clinical observations, patient history, and epidemiological information.  Fact Sheet for Patients:   StrictlyIdeas.no  Fact Sheet for Healthcare Providers: BankingDealers.co.za  This test is not yet approved or  cleared by the [place] FDA and has been authorized for detection and/or diagnosis of SARS-CoV-2 by FDA under an Emergency Use Authorization (EUA).  This EUA will remain in effect (meaning this test can be used) for the duration of the COVID-19 declaration under Section 564(b)(1) of the Act, 21 U.S.C. section 360bbb-3(b)(1), unless the authorization is terminated or revoked sooner.  Performed at [facility], [place] [address], [place], [place] [address]      Time coordinating discharge: 35 minutes  SIGNED:   Aline August, MD  [facility] 01/26/2020, 10:06 AM

## 2020-01-26 NOTE — Care Management Important Message (Signed)
Important Message  Patient Details IM Letter mailed to Patient Name: Chloe Harding MRN: [id number] Date of Birth: [birth year]   Medicare Important Message Given:  Yes     Kerin Salen 01/26/2020, 2:01 PM

## 2020-01-26 NOTE — Progress Notes (Signed)
Subjective: No acute events.  Objective: Vital signs in last 24 hours: Temp:  [97.7 F (36.5 C)-99.5 F (37.5 C)] 97.8 F (36.6 C) (09/10 0602) Pulse Rate:  [87-97] 87 (09/10 0602) Resp:  [13-24] 16 (09/10 0602) BP: (112-170)/(39-86) 156/86 (09/10 0602) SpO2:  [97 %-100 %] 98 % (09/10 0602) Weight:  [75.2 kg] 75.2 kg (09/09 1114) Last BM Date: 01/24/20  Intake/Output from previous day: 09/09 0701 - 09/10 0700 In: 614.6 [I.V.:614.6] Out: 1500 [Urine:1500] Intake/Output this shift: Total I/O In: 200 [I.V.:200] Out: 450 [Urine:450]  General appearance: alert, no distress and not oriented to time or place GI: soft, non-tender; bowel sounds normal; no masses,  no organomegaly  Lab Results: Recent Labs    01/24/20 0341 01/25/20 0334 01/26/20 0340  WBC 4.9 4.9 5.3  HGB 9.0* 9.4* 10.0*  HCT 26.4* 28.7* 30.1*  PLT 196 225 265   BMET Recent Labs    01/24/20 0341 01/25/20 0334 01/26/20 0340  NA 135 137 137  K 3.6 3.3* 3.8  CL 107 109 109  CO2 23 22 23   GLUCOSE 102* 68* 90  BUN <5* <5* <5*  CREATININE 0.67 0.67 0.70  CALCIUM 7.3* 7.6* 7.6*   LFT No results for input(s): PROT, ALBUMIN, AST, ALT, ALKPHOS, BILITOT, BILIDIR, IBILI in the last 72 hours. PT/INR No results for input(s): LABPROT, INR in the last 72 hours. Hepatitis Panel No results for input(s): HEPBSAG, HCVAB, HEPAIGM, HEPBIGM in the last 72 hours. C-Diff No results for input(s): CDIFFTOX in the last 72 hours. Fecal Lactopherrin No results for input(s): FECLLACTOFRN in the last 72 hours.  Studies/Results: No results found.  Medications:  Scheduled: . atorvastatin  20 mg Oral Daily  . Chlorhexidine Gluconate Cloth  6 each Topical Daily  . escitalopram  10 mg Oral Daily  . ferrous sulfate  325 mg Oral BID PC  . folic acid  1 mg Oral Daily  . insulin aspart  0-6 Units Subcutaneous TID WC  . levETIRAcetam  500 mg Oral BID  . metoprolol tartrate  25 mg Oral BID  . pantoprazole  40 mg Oral BID AC   . ropinirole  5 mg Oral QHS  . sucralfate  1 g Oral TID WC & HS   Continuous: . dextrose 5 % and 0.9% NaCl 50 mL/hr at 01/25/20 2200    Assessment/Plan: 1) Distal esophageal ulcer. 2) LA Grade D esophagitis. 3) Proximal esophageal stricture s/p dilation. 4) Persistent metabolic encephalopathy.   The patient did not suffer with any complications from the EGD with dilation.  It is unclear why she is so prone to suffering from esophagitis/esophageal ulceration, but she will need to be on a PPI BID indefinitely.  As for her swallowing, she should be able to swallow anything that she desires.  Her HGB is stable.  Plan: 1) Advance diet as tolerated. 2) PPI BID indefinitely. 3) Sucralfate 1 gram TID x 1 month. 4) Follow HGB and transfuse as necessary. 5) Signing off.    LOS: 6 days   Chloe Harding D 01/26/2020, 6:07 AM

## 2020-01-26 NOTE — Plan of Care (Signed)
  Problem: Pain Managment: Goal: General experience of comfort will improve Outcome: Progressing   Problem: Safety: Goal: Ability to remain free from injury will improve Outcome: Progressing   Problem: Skin Integrity: Goal: Risk for impaired skin integrity will decrease Outcome: Progressing   

## 2020-01-29 ENCOUNTER — Encounter: Payer: Self-pay | Admitting: Adult Health

## 2020-01-29 ENCOUNTER — Non-Acute Institutional Stay (SKILLED_NURSING_FACILITY): Payer: Medicare Other | Admitting: Adult Health

## 2020-01-29 DIAGNOSIS — I5042 Chronic combined systolic (congestive) and diastolic (congestive) heart failure: Secondary | ICD-10-CM | POA: Diagnosis not present

## 2020-01-29 DIAGNOSIS — G9341 Metabolic encephalopathy: Secondary | ICD-10-CM

## 2020-01-29 DIAGNOSIS — E11649 Type 2 diabetes mellitus with hypoglycemia without coma: Secondary | ICD-10-CM | POA: Diagnosis not present

## 2020-01-29 DIAGNOSIS — K2211 Ulcer of esophagus with bleeding: Secondary | ICD-10-CM

## 2020-01-29 DIAGNOSIS — R339 Retention of urine, unspecified: Secondary | ICD-10-CM

## 2020-01-29 DIAGNOSIS — F332 Major depressive disorder, recurrent severe without psychotic features: Secondary | ICD-10-CM

## 2020-01-29 DIAGNOSIS — R5381 Other malaise: Secondary | ICD-10-CM

## 2020-01-29 DIAGNOSIS — S065X9A Traumatic subdural hemorrhage with loss of consciousness of unspecified duration, initial encounter: Secondary | ICD-10-CM | POA: Diagnosis not present

## 2020-01-29 DIAGNOSIS — S065XAA Traumatic subdural hemorrhage with loss of consciousness status unknown, initial encounter: Secondary | ICD-10-CM

## 2020-01-29 LAB — HEMOGLOBIN A1C: Hemoglobin A1C: 4.6

## 2020-01-29 NOTE — Progress Notes (Signed)
Location:  [facility] Room Number: 893-Y Place of Service:  SNF (31) Provider:  Durenda Age, DNP, FNP-BC  Patient Care Team: [name], NP as PCP - General (Nurse Practitioner) [name], MD as PCP - Cardiology (Cardiology)  Extended Emergency Contact Information Primary Emergency Contact: Pasternak,[name] Address: [place], [place] [address] Johnnette Litter of [place] Phone: [phone] Mobile Phone: [phone] Relation: Spouse Secondary Emergency Contact: Arlice Colt [place] of [place] Phone: [phone] Mobile Phone: [phone] Relation: Daughter  Code Status:  FULL CODE  Goals of care: Advanced Directive information Advanced Directives 01/25/2020  Does Patient Have a Medical Advance Directive? Unable to assess, patient is non-responsive or altered mental status  Type of Advance Directive -  Does patient want to make changes to medical advance directive? -  Copy of [place] in Chart? -  Would patient like information on creating a medical advance directive? -     Chief Complaint  Patient presents with  . Acute Visit    Patient is seen for followup, status post hospitalization at [facility] 9/2-9/10/21 for acute encephalopathy     HPI:  Pt is a 74 y.o. female seen today for hospital follow-up.  She has a PMH of diabetes mellitus, hypercholesterolemia, hypertension, reflux, hiatal hernia, depression and arthritis.  She was admitted to [facility] post hospitalization 01/18/20 to 01/26/2020 for acute metabolic encephalopathy of unclear etiology.  MRI and EEG without positive findings.  Neurology was consulted.  Sedative medications including gabapentin, Flexeril and pain medications were held.  MRI showed 2 mm small hematoma and probable 1 smaller SDH along the right occipital.  Repeat CT head on 01/21/2020 showed no hematoma.  She was started on  prophylactic Keppra on 01/21/20 for a total of 7 days.  EEG showed no seizures.  CT abdomen showed impacted stool and was treated with laxatives.  She was found to have heme positive stool.  She was continued PPI and to avoid anticoagulations.  EGD with dilation was done on 01/25/2020.  GI recommended PPI twice a day indefinitely along with sucralfate x1 month.  She will have GI follow-up in 1 month.  She was seen today.  Review of CBGs noted to range from 59-90.  She is not diabetic medications.   Past Medical History:  Diagnosis Date  . Arthritis   . Depression   . Diabetes mellitus   . Hiatal hernia   . Hypercholesteremia   . Hypertension   . Reflux   . Renal disorder    shutting down 4 years ago   Past Surgical History:  Procedure Laterality Date  . CHOLECYSTECTOMY    . COLONOSCOPY WITH PROPOFOL N/A 05/20/2017   Procedure: COLONOSCOPY WITH PROPOFOL;  Surgeon: Carol Ada, MD;  Location: [facility];  Service: Endoscopy;  Laterality: N/A;  . ESOPHAGOGASTRODUODENOSCOPY N/A 05/19/2017   Procedure: ESOPHAGOGASTRODUODENOSCOPY (EGD);  Surgeon: Juanita Craver, MD;  Location: [facility] ENDOSCOPY;  Service: Endoscopy;  Laterality: N/A;  . ESOPHAGOGASTRODUODENOSCOPY (EGD) WITH PROPOFOL N/A 01/17/2019   Procedure: ESOPHAGOGASTRODUODENOSCOPY (EGD) WITH PROPOFOL;  Surgeon: Carol Ada, MD;  Location: [facility] ENDOSCOPY;  Service: Endoscopy;  Laterality: N/A;  . SAVORY DILATION N/A 01/17/2019   Procedure: SAVORY DILATION;  Surgeon: Carol Ada, MD;  Location: [facility] ENDOSCOPY;  Service: Endoscopy;  Laterality: N/A;    Allergies  Allergen Reactions  . Amlodipine Besylate Swelling    swelling    Outpatient Encounter Medications as of  01/29/2020  Medication Sig  . atorvastatin (LIPITOR) 20 MG tablet Take 1 tablet (20 mg total) by mouth daily.  . bisacodyl (DULCOLAX) 5 MG EC tablet Take 1 tablet (5 mg total) by mouth daily as needed for moderate constipation.  . CONSTULOSE 10 GM/15ML solution Take 10 g by mouth  daily as needed for mild constipation. Lactulose  . escitalopram (LEXAPRO) 10 MG tablet Take 10 mg by mouth daily.  . ferrous sulfate 325 (65 FE) MG tablet Take 1 tablet (325 mg total) by mouth 2 (two) times daily with a meal.  . folic acid (FOLVITE) 1 MG tablet Take 1 mg by mouth daily.  . furosemide (LASIX) 40 MG tablet Take 1 tablet (40 mg total) by mouth daily.  . hydroxychloroquine (PLAQUENIL) 200 MG tablet Take 200 mg by mouth daily.  . metoprolol tartrate (LOPRESSOR) 25 MG tablet Take 1 tablet (25 mg total) by mouth 2 (two) times daily.  Marland Kitchen ofloxacin (OCUFLOX) 0.3 % ophthalmic solution Place 1 drop into both eyes 4 (four) times daily. For 7 days  . pantoprazole (PROTONIX) 40 MG tablet Take 1 tablet (40 mg total) by mouth 2 (two) times daily.  . polyethylene glycol (MIRALAX / GLYCOLAX) 17 g packet Take 17 g by mouth daily as needed for moderate constipation or severe constipation.  . potassium chloride (KLOR-CON) 10 MEQ tablet Take 10 mEq by mouth daily.  . ropinirole (REQUIP) 5 MG tablet Take 5 mg by mouth at bedtime.  . senna-docusate (SENOKOT-S) 8.6-50 MG tablet Take 1 tablet by mouth at bedtime.  . sucralfate (CARAFATE) 1 g tablet Take 1 tablet (1 g total) by mouth 4 (four) times daily -  with meals and at bedtime.  . Vitamin D3 (VITAMIN D) 25 MCG tablet Take 1 tablet (1,000 Units total) by mouth daily.  . [DISCONTINUED] levETIRAcetam (KEPPRA) 500 MG tablet Take 1 tablet (500 mg total) by mouth 2 (two) times daily for 2 days.   No facility-administered encounter medications on file as of 01/29/2020.    Review of Systems  Unable to obtain due to confusion   Immunization History  Administered Date(s) Administered  . Influenza Whole 03/11/2010  . Pneumococcal Conjugate-13 08/24/2017  . Td 07/16/2010  . Zoster Recombinat (Shingrix) 02/27/2018   Pertinent  Health Maintenance Due  Topic Date Due  . FOOT EXAM  Never done  . OPHTHALMOLOGY EXAM  Never done  . URINE MICROALBUMIN   05/17/2011  . PNA vac Low Risk Adult (2 of 2 - PPSV23) 08/25/2018  . MAMMOGRAM  11/16/2019  . INFLUENZA VACCINE  12/17/2019  . HEMOGLOBIN A1C  05/07/2020  . COLONOSCOPY  05/21/2027  . DEXA SCAN  Completed    Vitals:   01/29/20 1119  BP: 131/81  Pulse: 98  Resp: 17  Temp: (!) 97.5 F (36.4 C)  TempSrc: Oral  Weight: 165 lb 12.6 oz (75.2 kg)  Height: 5\' 4"  (1.626 m)   Body mass index is 28.46 kg/m.  Physical Exam  GENERAL APPEARANCE: Well nourished. In no acute distress. Normal body habitus SKIN:  Skin is warm and dry.  MOUTH and THROAT: Lips are without lesions. Oral mucosa is moist and without lesions. Tongue is normal in shape, size, and color and without lesions RESPIRATORY: Breathing is even & unlabored, BS CTAB CARDIAC: RRR, no murmur,no extra heart sounds, no edema GI: Abdomen soft, normal BS, no masses, no tenderness GU:  Has Foley catheter draining to urine bag, Fr 14 EXTREMITIES: Able to move x4 extremities NEUROLOGICAL:  There is no tremor. Speech is clear.  Alert to self, disoriented to time and place. PSYCHIATRIC: Affect and behavior are appropriate  Labs reviewed: Recent Labs    11/08/19 0344 11/09/19 0337 01/21/20 0315 01/21/20 0315 01/22/20 0410 01/22/20 0410 01/24/20 0341 01/25/20 0334 01/26/20 0340  NA 134*   < > 140   < > 139   < > 135 137 137  K 3.4*   < > 3.4*   < > 3.8   < > 3.6 3.3* 3.8  CL 102   < > 110   < > 111   < > 107 109 109  CO2 23   < > 22   < > 23   < > 23 22 23   GLUCOSE 116*   < > 114*   < > 106*   < > 102* 68* 90  BUN 12   < > 10   < > 7*   < > <5* <5* <5*  CREATININE 0.95   < > 0.70   < > 0.60   < > 0.67 0.67 0.70  CALCIUM 8.8*   < > 8.5*   < > 7.9*   < > 7.3* 7.6* 7.6*  MG 1.8   < > 1.2*   < > 1.8  --   --  1.3* 1.7  PHOS 3.1  --  1.8*  --  2.6  --   --   --   --    < > = values in this interval not displayed.   Recent Labs    12/25/19 0237 12/26/19 0540 12/27/19 0430  AST 13* 14* 19  ALT 8 7 10   ALKPHOS 68 63 54    BILITOT 0.5 0.5 0.4  PROT 5.1* 5.4* 5.3*  ALBUMIN 2.6* 2.8* 2.8*   Recent Labs    01/18/20 2032 01/19/20 0330 01/24/20 0341 01/25/20 0334 01/26/20 0340  WBC 6.7   < > 4.9 4.9 5.3  NEUTROABS 2.9  --   --  2.2 2.4  HGB 12.4   < > 9.0* 9.4* 10.0*  HCT 37.4   < > 26.4* 28.7* 30.1*  MCV 84.4   < > 84.9 85.4 84.1  PLT 240   < > 196 225 265   < > = values in this interval not displayed.   Lab Results  Component Value Date   TSH 0.702 01/19/2020   Lab Results  Component Value Date   HGBA1C 5.6 11/06/2019   Lab Results  Component Value Date   CHOL 189 03/25/2010   HDL 39 (L) 03/25/2010   LDLCALC 118 (H) 03/25/2010   LDLDIRECT 44 11/22/2019   TRIG 159 (H) 03/25/2010   CHOLHDL 4.8 Ratio 03/25/2010    Significant Diagnostic Results in last 30 days:  CT ABDOMEN PELVIS WO CONTRAST  Result Date: 01/19/2020 CLINICAL DATA:  Generalized abdominal pain. EXAM: CT ABDOMEN AND PELVIS WITHOUT CONTRAST TECHNIQUE: Multidetector CT imaging of the abdomen and pelvis was performed following the standard protocol without IV contrast. COMPARISON:  December 23, 2019. FINDINGS: Lower chest: Small bilateral pleural effusions are noted. Right posterior basilar atelectasis or infiltrate is noted. Hepatobiliary: No focal liver abnormality is seen. Status post cholecystectomy. No biliary dilatation. Pancreas: Unremarkable. No pancreatic ductal dilatation or surrounding inflammatory changes. Spleen: Normal in size without focal abnormality. Adrenals/Urinary Tract: Adrenal glands are unremarkable. Kidneys are normal, without renal calculi, focal lesion, or hydronephrosis. Bladder is unremarkable. Stomach/Bowel: The stomach appears normal. There is no evidence of bowel obstruction. The  appendix is not visualized. Sigmoid diverticulosis is noted. There is large amount of stool seen in the distal sigmoid colon and rectum concerning for impaction. Mild wall thickening of the rectum is noted with surrounding inflammatory  changes concerning for proctitis. Vascular/Lymphatic: Aortic atherosclerosis. No enlarged abdominal or pelvic lymph nodes. Reproductive: Uterus and bilateral adnexa are unremarkable. Other: No abdominal wall hernia or abnormality. No abdominopelvic ascites. Musculoskeletal: No acute or significant osseous findings. IMPRESSION: 1. Small bilateral pleural effusions are noted with right posterior basilar atelectasis or infiltrate. 2. Sigmoid diverticulosis without inflammation. 3. Large amount of stool seen in the distal sigmoid colon and rectum concerning for impaction. Mild wall thickening of the rectum is noted with surrounding inflammatory changes concerning for proctitis. 4. Aortic atherosclerosis. Aortic Atherosclerosis (ICD10-I70.0). Electronically Signed   By: Marijo Conception M.D.   On: 01/19/2020 12:02   DG Abd 1 View  Result Date: 01/21/2020 CLINICAL DATA:  Fecal impaction EXAM: ABDOMEN - 1 VIEW COMPARISON:  CT abdomen/pelvis dated 01/19/2020 FINDINGS: Nonobstructive bowel gas pattern. Normal colonic stool burden. However, there is moderate stool in the rectal vault, compatible with the clinical history of fecal impaction. Cholecystectomy clips. Degenerative changes of the lumbar spine. IMPRESSION: Moderate stool in the rectal vault, compatible with the clinical history of fecal impaction. Electronically Signed   By: Julian Hy M.D.   On: 01/21/2020 04:39   CT HEAD WO CONTRAST  Result Date: 01/21/2020 CLINICAL DATA:  Altered mental status. EXAM: CT HEAD WITHOUT CONTRAST TECHNIQUE: Contiguous axial images were obtained from the base of the skull through the vertex without intravenous contrast. COMPARISON:  January 18, 2020. FINDINGS: Brain: Mild diffuse cortical atrophy is noted. Mild chronic ischemic white matter disease is noted. No mass effect or midline shift is noted. Ventricular size is within normal limits. There is no evidence of mass lesion, hemorrhage or acute infarction. Vascular: No  hyperdense vessel or unexpected calcification. Skull: Normal. Negative for fracture or focal lesion. Sinuses/Orbits: No acute finding. Other: None. IMPRESSION: Mild diffuse cortical atrophy. Mild chronic ischemic white matter disease. No acute intracranial abnormality seen. Electronically Signed   By: Marijo Conception M.D.   On: 01/21/2020 09:58   CT Head Wo Contrast  Result Date: 01/18/2020 CLINICAL DATA:  Mental status change EXAM: CT HEAD WITHOUT CONTRAST TECHNIQUE: Contiguous axial images were obtained from the base of the skull through the vertex without intravenous contrast. COMPARISON:  None. FINDINGS: Brain: No evidence of acute territorial infarction, hemorrhage, hydrocephalus,extra-axial collection or mass lesion/mass effect. There is low-attenuation changes in the deep white matter consistent with small vessel ischemia. Ventricles are normal in size and contour. Vascular: No hyperdense vessel or unexpected calcification. Skull: The skull is intact. No fracture or focal lesion identified. Sinuses/Orbits: Small amount of fluid seen within the left maxillary sinus and right sphenoid sinus. The orbits and globes intact. Other: None IMPRESSION: No acute intracranial abnormality. Findings consistent chronic small vessel ischemia Electronically Signed   By: Prudencio Pair M.D.   On: 01/18/2020 22:13   MR BRAIN WO CONTRAST  Result Date: 01/20/2020 CLINICAL DATA:  Mental status change with unknown cause. Multiple recent falls. EXAM: MRI HEAD WITHOUT CONTRAST TECHNIQUE: Multiplanar, multiecho pulse sequences of the brain and surrounding structures were obtained without intravenous contrast. COMPARISON:  This head CT from 2 days ago FINDINGS: Brain: Trace subdural hemorrhage along the left cerebral convexity, mainly inferiorly, only 2 mm in maximal thickness. A trace right occipital subdural collection could also be present. Head CT follow-up  in 24 hours could be considered. No acute infarct, hydrocephalus, or  masslike finding. Moderate chronic small vessel ischemia in the periventricular white matter and pons. Vascular: Normal flow voids. Skull and upper cervical spine: Normal marrow signal Sinuses/Orbits: No acute finding.  Bilateral cataract resection Other: These results were called by telephone at the time of interpretation on 01/20/2020 at 12:45 pm to provider [facility] , who verbally acknowledged these results. IMPRESSION: 1. Thin subdural hematoma along the left cerebral convexity (only 2 mm in thickness). This was not seen on head CT from 2 days ago but may have been too thin to detect by CT. No mass effect. 2. Probable even thinner subdural hematoma along the right occipital convexity. 3. No acute infarct. 4. Moderate motion artifact. Electronically Signed   By: Monte Fantasia M.D.   On: 01/20/2020 12:53   DG Chest Port 1 View  Result Date: 01/18/2020 CLINICAL DATA:  Altered mental status, status post fall. EXAM: PORTABLE CHEST 1 VIEW COMPARISON:  December 23, 2019 FINDINGS: Decreased lung volumes are seen which is likely, in part, secondary to the degree of patient inspiration. There is no evidence of acute infiltrate, pleural effusion or pneumothorax. Numerous radiopaque overlying cardiac lead wires are present. The heart size and mediastinal contours are within normal limits. There is marked severity calcification of the aortic arch. No acute osseous abnormalities are identified. IMPRESSION: Low lung volumes without evidence of acute or active cardiopulmonary disease. Electronically Signed   By: Virgina Norfolk M.D.   On: 01/18/2020 22:18   EEG adult  Result Date: 01/22/2020 Lora Havens, MD     01/22/2020  3:59 PM Patient Name: RASHAN PATIENT MRN: [id number] Epilepsy Attending: Lora Havens Referring Physician/Provider: Dr Donnetta Simpers Date: 01/22/2020 Duration: 24.45 mins Patient history: 74 y.o. female with PMH significant for diabetes mellitus, CHF, chronic anemia, hyperlipidemia,  hypertension  who presents with AMS and multiple falls. CTH showed no acute intracranial abnormality. MR Brain showed trace subdural hemorrhage along the left cerebral convexity, mainly inferiorly, only 2 mm in maximal thickness. A trace right occipital subdural collection could also be present. EEG to evaluate for seizure.  Level of alertness: Awake AEDs during EEG study: LEV Technical aspects: This EEG study was done with scalp electrodes positioned according to the 10-20 International system of electrode placement. Electrical activity was acquired at a sampling rate of 500Hz  and reviewed with a high frequency filter of 70Hz  and a low frequency filter of 1Hz . EEG data were recorded continuously and digitally stored. Description: No posterior dominant rhythm was seen. EEG showed continuous generalized 3 to 6 Hz theta-delta slowing. 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 Hyperventilation and photic stimulation were not performed.   ABNORMALITY -Continuous slow, generalized -Periodic epileptiform discharges with triphasic morphology , generalized IMPRESSION: This study is suggestive of moderate to severe diffuse encephalopathy, nonspecific etiology but likely related to toxic-metabolic etiology. 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  No seizures were seen throughout the recording. [name]   DG HIPS BILAT WITH PELVIS 3-4 VIEWS  Result Date: 01/18/2020 CLINICAL DATA:  Status post fall. EXAM: DG HIP (WITH OR WITHOUT PELVIS) 3-4V BILAT COMPARISON:  None. FINDINGS: There is no evidence of hip fracture or dislocation. There is no evidence of arthropathy or other focal bone abnormality. IMPRESSION: Negative. Electronically Signed   By: Virgina Norfolk  M.D.   On: 01/18/2020 22:16    Assessment/Plan  1. Acute metabolic  encephalopathy -Unclear etiology, MRI and EEG with all positive findings -Slow to respond and poor appetite, holding sedative medications including gabapentin, Flexeril and pain medications - Neurology signed off  2. Subdural hematoma (HCC) -  MRI showed 2 mm small hematoma and probable 1 smaller SDH along the right occipital.  Repeat CT head on 01/21/2020 showed no hematoma.  She was started on prophylactic Keppra on 01/21/20 for a total of 7 days.  EEG showed no seizures.  -Neurology signed off  3. Type 2 diabetes mellitus with hypoglycemia without coma, without long-term current use of insulin (HCC) -  hgbA1c 4.6, 01/29/20 - CBGs low and not on any diabetic medications -  CBG ACHS - will start Glucagon 1 mg IM PRN for CBG <=65, may repeat in 15 minutes as needed 4. Chronic combined systolic and diastolic heart failure (HCC) - no SOB, continue metoprolol tartrate and furosemide -  Follow up with cardiology - weigh MWF, notify provider for weight gain of 5 lbs in a week  5.  Esophageal ulcer with bleeding -   found to have heme positive stool.  She was continued PPI and to avoid anticoagulations.  EGD with dilation was done on 01/25/2020.  GI recommended PPI twice a day indefinitely along with sucralfate x1 month.  She will have GI, Dr. Carol Ada, follow-up in 1 month.  6. Severe episode of recurrent major depressive disorder, without psychotic features ([place]) -Continue Escitalopram  7. Urinary retention -  Has foley catheter  8. Physical deconditioning - for PT and OT, for therapeutic strengthening exercises -Fall precautions    Family/ staff Communication: Discussed plan of care with charge nurse.  Labs/tests ordered: None  Goals of care:   Short-term care   Durenda Age, DNP, MSN, FNP-BC [facility] [phone] (Monday-Friday 8:00 a.m. - 5:00 p.m.) [phone] (after hours)

## 2020-01-30 ENCOUNTER — Non-Acute Institutional Stay (SKILLED_NURSING_FACILITY): Payer: Medicare Other | Admitting: Internal Medicine

## 2020-01-30 ENCOUNTER — Encounter (HOSPITAL_COMMUNITY): Payer: Self-pay | Admitting: Gastroenterology

## 2020-01-30 DIAGNOSIS — N179 Acute kidney failure, unspecified: Secondary | ICD-10-CM | POA: Diagnosis not present

## 2020-01-30 DIAGNOSIS — R29818 Other symptoms and signs involving the nervous system: Secondary | ICD-10-CM

## 2020-01-30 DIAGNOSIS — K222 Esophageal obstruction: Secondary | ICD-10-CM | POA: Diagnosis not present

## 2020-01-30 DIAGNOSIS — G9340 Encephalopathy, unspecified: Secondary | ICD-10-CM

## 2020-01-30 DIAGNOSIS — I10 Essential (primary) hypertension: Secondary | ICD-10-CM

## 2020-01-30 DIAGNOSIS — R4189 Other symptoms and signs involving cognitive functions and awareness: Secondary | ICD-10-CM

## 2020-01-30 DIAGNOSIS — D500 Iron deficiency anemia secondary to blood loss (chronic): Secondary | ICD-10-CM | POA: Diagnosis not present

## 2020-01-30 DIAGNOSIS — E1142 Type 2 diabetes mellitus with diabetic polyneuropathy: Secondary | ICD-10-CM

## 2020-01-30 NOTE — Progress Notes (Signed)
NURSING HOME LOCATION:  [facility] ROOM NUMBER:  [id number]  CODE STATUS:  FULL CODE  PCP:  [name], NP  [place] [address]  This is a comprehensive admission note to [facility] performed on this date less than 30 days from date of admission. Included are preadmission medical/surgical history; reconciled medication list; family history; social history and comprehensive review of systems.  Corrections and additions to the records were documented. Comprehensive physical exam was also performed. Additionally a clinical summary was entered for each active diagnosis pertinent to this admission in the Problem List to enhance continuity of care.  HPI: Patient was hospitalized 9/2-9/02/2020 with acute metabolic encephalopathy of unclear etiology.  Metabolic etiology was suspect.  Exam was nonfocal and unrevealing.  TSH, ammonia, and B12 levels were normal.  AKI was present; this resolved with IV fluids.  No significant findings were present on MRI and EEG.  MRI did show a 2 mm small hematoma and possibly 1 small or area of SDH along the right occiput.  Repeat CT on 9/5 revealed no hematoma.  Neurology consulted and recommended prophylactic Keppra for 7 days and delirium precautions.  Gabapentin, Flexeril, and pain medications were held as possible contributing factors. She exhibited dysphagia in the context of a history of esophageal strictures.  Anemia of chronic disease was felt to be present with heme positive stool.  Actually at admission 9/2 hemoglobin 12.4/hematocrit 37.4.  Anemia reached its nadir on 9/5 with values of 7.8/24.4.  At discharge hemoglobin was 10/hematocrit 30.1.  EGD with dilation was performed 9/9 by Dr. Benson Norway.  Anticoagulants and antiplatelet meds were to be avoided.  PPI twice daily indefinitely and sulcralfate x1 month were recommended.     A diagnosis of diabetes is listed in the past medical history; but glucose range in Epic was 68-125.  She  exhibited hypoglycemia which necessitated dextrose drip.  On 9/13 A1c was 4.6% which is in the nondiabetic range.  Glucoses here at the facility reveal fastings of 59-90 with evening glucoses of 77 up to 113. Hypomagnesemia and hypokalemia were also corrected. Foley catheter was placed for urinary retention. Outpatient follow-up assessment by palliative care was recommended if clinical condition does not improve after rehab at [facility].  Neurology and GI follow-up with Dr. Benson Norway as scheduled.  Past medical and surgical history: Includes history of restless leg syndrome, GERD, essential hypertension, dyslipidemia, history of chronic systolic and diastolic CHF, hypothyroidism, and depression. Surgeries and procedures include cholecystectomy, EGD on several occasions, and savory dilation.  Social history: Nondrinker; never smoked.  Family history: Reviewed.  Family history is strongly positive for heart attack.   Review of systems:  Could not be completed due to dementia. Date given as "my birthday" and finally "[birth year]".  She cannot name the president.  Her responses were nonsensical.  Most of the time she was laughing inappropriately for no reason.  She made the statement that she was "not going to do any legal documents".  Staff states that they have to keep her in the wheelchair in the hallway to monitor her as she ambulates without assistance in her room with risk of falling.  As I attempted GI review of systems; her response was "I do not think we are".  Staff has not reported any bleeding dyscrasias.   Physical exam:  Pertinent or positive findings: As noted she sitting in the wheelchair and obviously has profound neurocognitive deficits.  She laughs intermittently for no apparent reason.  There  appears to be resolving bruise over the right forehead.  The eyebrows are almost absent.  Teeth are coated.  Slight gallop cadence is present.  She has trace edema at the sock line.  Strength to opposition  appears to be fair except she pressed down against the wheelchair arm rather than against my hand when I attempted to test the left upper extremity.  She was able to follow commands for the most part but responses were slow.  She has extensive bruising over the dorsum of the hands, greater on the right.  She has mild DIP arthritic changes.  General appearance: Adequately nourished; no acute distress, increased work of breathing is present.   Lymphatic: No lymphadenopathy about the head, neck, axilla. Eyes: No conjunctival inflammation or lid edema is present. There is no scleral icterus. Ears:  External ear exam shows no significant lesions or deformities.   Nose:  External nasal examination shows no deformity or inflammation. Nasal mucosa are pink and moist without lesions, exudates Neck:  No thyromegaly, masses, tenderness noted.    Heart:  No murmur, click, rub.  Lungs:  without wheezes, rhonchi, rales, rubs. Abdomen: Bowel sounds are normal.  Abdomen is soft and nontender with no organomegaly, hernias, masses. GU: Deferred  Extremities:  No cyanosis, clubbing. Neurologic exam:Balance, Rhomberg, finger to nose testing could not be completed due to clinical state Skin: Warm & dry w/o tenting. No significant lesions or rash.  See clinical summary under each active problem in the Problem List with associated updated therapeutic plan

## 2020-01-30 NOTE — Assessment & Plan Note (Signed)
BP controlled; no change in antihypertensive medications  

## 2020-01-30 NOTE — Patient Instructions (Signed)
See assessment and plan under each diagnosis in the problem list and acutely for this visit 

## 2020-01-30 NOTE — Assessment & Plan Note (Signed)
01/26/2020 creatinine back at baseline at 0.7.  Avoid all nephrotoxic medications.

## 2020-01-30 NOTE — Assessment & Plan Note (Signed)
Outpatient neurology follow-up as scheduled.

## 2020-01-30 NOTE — Assessment & Plan Note (Addendum)
Glucoses at SNF range from 59 up to a high of 113.  A1c was 4.6% on 9/13, nondiabetic.

## 2020-01-30 NOTE — Assessment & Plan Note (Addendum)
Dual therapy with PPI twice daily and sucralfate x1 month continued at SNF OP GI appt with Dr Benson Norway as scheduled

## 2020-01-30 NOTE — Assessment & Plan Note (Signed)
See 01/30/2020.  Patient could not give me the date or the name of the president.  She claimed it was her birthday.  She finally stated that the year was "10,000". CT findings suggest vascular dementia. MMSE assessment will probably be necessary in view of her full CODE STATUS to document lack of competency.

## 2020-01-30 NOTE — Assessment & Plan Note (Signed)
No bleeding dyscrasias reported SNF. °

## 2020-01-31 LAB — METHYLMALONIC ACID, SERUM: Methylmalonic Acid, Quantitative: 246 nmol/L (ref 0–378)

## 2020-02-06 ENCOUNTER — Non-Acute Institutional Stay (SKILLED_NURSING_FACILITY): Payer: Medicare Other | Admitting: Adult Health

## 2020-02-06 ENCOUNTER — Encounter: Payer: Self-pay | Admitting: Adult Health

## 2020-02-06 DIAGNOSIS — R29818 Other symptoms and signs involving the nervous system: Secondary | ICD-10-CM

## 2020-02-06 DIAGNOSIS — I10 Essential (primary) hypertension: Secondary | ICD-10-CM | POA: Diagnosis not present

## 2020-02-06 DIAGNOSIS — S065XAA Traumatic subdural hemorrhage with loss of consciousness status unknown, initial encounter: Secondary | ICD-10-CM

## 2020-02-06 DIAGNOSIS — G2581 Restless legs syndrome: Secondary | ICD-10-CM

## 2020-02-06 DIAGNOSIS — F339 Major depressive disorder, recurrent, unspecified: Secondary | ICD-10-CM

## 2020-02-06 DIAGNOSIS — I5042 Chronic combined systolic (congestive) and diastolic (congestive) heart failure: Secondary | ICD-10-CM

## 2020-02-06 DIAGNOSIS — K2211 Ulcer of esophagus with bleeding: Secondary | ICD-10-CM | POA: Diagnosis not present

## 2020-02-06 DIAGNOSIS — S065X9A Traumatic subdural hemorrhage with loss of consciousness of unspecified duration, initial encounter: Secondary | ICD-10-CM | POA: Diagnosis not present

## 2020-02-06 DIAGNOSIS — R4189 Other symptoms and signs involving cognitive functions and awareness: Secondary | ICD-10-CM

## 2020-02-06 NOTE — Progress Notes (Signed)
Location:  [place] Room Number: 119-A Place of Service:  SNF (31) Provider:  Durenda Age, DNP, FNP-BC  Patient Care Team: [name], NP as PCP - General (Nurse Practitioner) [name], MD as PCP - Cardiology (Cardiology)  Extended Emergency Contact Information Primary Emergency Contact: Harding,Chloe Address: [place], [place] [address] Chloe Harding of [place] Phone: [phone] Mobile Phone: [phone] Relation: Spouse Secondary Emergency Contact: Chloe Harding [place] of [place] Phone: [phone] Mobile Phone: [phone] Relation: Daughter  Code Status:  FULL CODE  Goals of care: Advanced Directive information Advanced Directives 02/06/2020  Does Patient Have a Medical Advance Directive? No  Type of Advance Directive -  Does patient want to make changes to medical advance directive? -  Copy of [place] in Chart? -  Would patient like information on creating a medical advance directive? No - Patient declined     Chief Complaint  Patient presents with  . Medical Management of Chronic Issues    Short-term rehabilitation routine    HPI:  Pt is a 74 y.o. female seen today for medical management of chronic diseases.  She is a short-term care resident of [facility].  She has a PMH of diabetes mellitus, hypercholesterolemia, hypertension, reflux, hiatal hernia, depression and arthritis. She was seen in her room today. No reported SOB. She takes furosemide 40 mg daily for CHF. No reported bloody secretions. Latest hgb 12.1, 01/30/20. She takes Pantoprazole 40 mg BID and Sucralfate 1 gm with meals and at bedtime for Ulcer of the esophagus with bleeding.    Past Medical History:  Diagnosis Date  . Arthritis   . Depression   . Diabetes mellitus   . Hiatal hernia   . Hypercholesteremia   . Hypertension   . Reflux   . Renal disorder     shutting down 4 years ago   Past Surgical History:  Procedure Laterality Date  . BALLOON DILATION N/A 01/25/2020   Procedure: BALLOON DILATION;  Surgeon: Carol Ada, MD;  Location: [facility];  Service: Endoscopy;  Laterality: N/A;  . CHOLECYSTECTOMY    . COLONOSCOPY WITH PROPOFOL N/A 05/20/2017   Procedure: COLONOSCOPY WITH PROPOFOL;  Surgeon: Carol Ada, MD;  Location: [place];  Service: Endoscopy;  Laterality: N/A;  . ESOPHAGOGASTRODUODENOSCOPY N/A 05/19/2017   Procedure: ESOPHAGOGASTRODUODENOSCOPY (EGD);  Surgeon: Juanita Craver, MD;  Location: [facility] ENDOSCOPY;  Service: Endoscopy;  Laterality: N/A;  . ESOPHAGOGASTRODUODENOSCOPY (EGD) WITH PROPOFOL N/A 01/17/2019   Procedure: ESOPHAGOGASTRODUODENOSCOPY (EGD) WITH PROPOFOL;  Surgeon: Carol Ada, MD;  Location: [facility] ENDOSCOPY;  Service: Endoscopy;  Laterality: N/A;  . ESOPHAGOGASTRODUODENOSCOPY (EGD) WITH PROPOFOL N/A 01/25/2020   Procedure: ESOPHAGOGASTRODUODENOSCOPY (EGD) WITH PROPOFOL;  Surgeon: Carol Ada, MD;  Location: [facility] ENDOSCOPY;  Service: Endoscopy;  Laterality: N/A;  . SAVORY DILATION N/A 01/17/2019   Procedure: SAVORY DILATION;  Surgeon: Carol Ada, MD;  Location: [facility] ENDOSCOPY;  Service: Endoscopy;  Laterality: N/A;    Allergies  Allergen Reactions  . Amlodipine Besylate Swelling    swelling    Outpatient Encounter Medications as of 02/06/2020  Medication Sig  . atorvastatin (LIPITOR) 20 MG tablet Take 1 tablet (20 mg total) by mouth daily.  . bisacodyl (DULCOLAX) 5 MG EC tablet Take 1 tablet (5 mg total) by mouth daily as needed for moderate constipation.  . CONSTULOSE 10 GM/15ML solution Take 10 g by mouth daily as needed for mild constipation. Lactulose  .  escitalopram (LEXAPRO) 10 MG tablet Take 10 mg by mouth daily.  . ferrous sulfate 325 (65 FE) MG tablet Take 1 tablet (325 mg total) by mouth 2 (two) times daily with a meal.  . folic acid (FOLVITE) 1 MG tablet Take 1 mg by mouth daily.  . furosemide (LASIX) 40  MG tablet Take 1 tablet (40 mg total) by mouth daily.  . Glucagon HCl 1 MG SOLR Inject 1 mg as directed as needed.  . hydroxychloroquine (PLAQUENIL) 200 MG tablet Take 200 mg by mouth daily.  . metoprolol tartrate (LOPRESSOR) 25 MG tablet Take 1 tablet (25 mg total) by mouth 2 (two) times daily.  . Nutritional Supplement LIQD Take by mouth in the morning, at noon, and at bedtime. MedPass  . Nutritional Supplements (NUTRITIONAL SUPPLEMENT PO) Take 1 each by mouth in the morning and at bedtime. Magic Cup  . ondansetron (ZOFRAN) 4 MG tablet Take 4 mg by mouth every 8 (eight) hours as needed for nausea.  . pantoprazole (PROTONIX) 40 MG tablet Take 1 tablet (40 mg total) by mouth 2 (two) times daily.  . polyethylene glycol (MIRALAX / GLYCOLAX) 17 g packet Take 17 g by mouth daily as needed for moderate constipation or severe constipation.  . potassium chloride (KLOR-CON) 10 MEQ tablet Take 10 mEq by mouth daily.  . ropinirole (REQUIP) 5 MG tablet Take 5 mg by mouth at bedtime.  . senna-docusate (SENOKOT-S) 8.6-50 MG tablet Take 1 tablet by mouth at bedtime.  . sucralfate (CARAFATE) 1 g tablet Take 1 tablet (1 g total) by mouth 4 (four) times daily -  with meals and at bedtime.  . Vitamin D3 (VITAMIN D) 25 MCG tablet Take 1 tablet (1,000 Units total) by mouth daily.  . [DISCONTINUED] ofloxacin (OCUFLOX) 0.3 % ophthalmic solution Place 1 drop into both eyes 4 (four) times daily. For 7 days   No facility-administered encounter medications on file as of 02/06/2020.    Review of Systems  Unable to obtain due to cognitive impairment   Immunization History  Administered Date(s) Administered  . Influenza Whole 03/11/2010  . Pneumococcal Conjugate-13 08/24/2017  . Td 07/16/2010  . Zoster Recombinat (Shingrix) 02/27/2018   Pertinent  Health Maintenance Due  Topic Date Due  . FOOT EXAM  Never done  . OPHTHALMOLOGY EXAM  Never done  . URINE MICROALBUMIN  05/17/2011  . PNA vac Low Risk Adult (2 of 2 -  PPSV23) 08/25/2018  . MAMMOGRAM  11/16/2019  . INFLUENZA VACCINE  12/17/2019  . HEMOGLOBIN A1C  07/28/2020  . COLONOSCOPY  05/21/2027  . DEXA SCAN  Completed    Vitals:   02/06/20 1441  BP: 109/71  Pulse: (!) 117  Resp: 16  Temp: (!) 97 F (36.1 C)  TempSrc: Oral  Weight: 149 lb 9.6 oz (67.9 kg)  Height: 5\' 4"  (1.626 m)   Body mass index is 25.68 kg/m.  Physical Exam  GENERAL APPEARANCE: Well nourished. In no acute distress. Normal body habitus SKIN:  Skin is warm and dry.  MOUTH and THROAT: Lips are without lesions. Oral mucosa is moist and without lesions.  RESPIRATORY: Breathing is even & unlabored, BS CTAB CARDIAC: RRR, no murmur,no extra heart sounds, no edema GI: Abdomen soft, normal BS, no masses, no tenderness EXTREMITIES:  Able to move X 4 extremities NEUROLOGICAL: There is no tremor. Speech is clear.  Alert to self, disoriented to time and place. PSYCHIATRIC:  Affect and behavior are appropriate  Labs reviewed: Recent Labs  11/08/19 0344 11/09/19 9390 01/21/20 0315 01/21/20 0315 01/22/20 0410 01/22/20 0410 01/24/20 0341 01/25/20 0334 01/26/20 0340  NA 134*   < > 140   < > 139   < > 135 137 137  K 3.4*   < > 3.4*   < > 3.8   < > 3.6 3.3* 3.8  CL 102   < > 110   < > 111   < > 107 109 109  CO2 23   < > 22   < > 23   < > 23 22 23   GLUCOSE 116*   < > 114*   < > 106*   < > 102* 68* 90  BUN 12   < > 10   < > 7*   < > <5* <5* <5*  CREATININE 0.95   < > 0.70   < > 0.60   < > 0.67 0.67 0.70  CALCIUM 8.8*   < > 8.5*   < > 7.9*   < > 7.3* 7.6* 7.6*  MG 1.8   < > 1.2*   < > 1.8  --   --  1.3* 1.7  PHOS 3.1  --  1.8*  --  2.6  --   --   --   --    < > = values in this interval not displayed.   Recent Labs    12/25/19 0237 12/26/19 0540 12/27/19 0430  AST 13* 14* 19  ALT 8 7 10   ALKPHOS 68 63 54  BILITOT 0.5 0.5 0.4  PROT 5.1* 5.4* 5.3*  ALBUMIN 2.6* 2.8* 2.8*   Recent Labs    01/18/20 2032 01/19/20 0330 01/24/20 0341 01/25/20 0334  01/26/20 0340  WBC 6.7   < > 4.9 4.9 5.3  NEUTROABS 2.9  --   --  2.2 2.4  HGB 12.4   < > 9.0* 9.4* 10.0*  HCT 37.4   < > 26.4* 28.7* 30.1*  MCV 84.4   < > 84.9 85.4 84.1  PLT 240   < > 196 225 265   < > = values in this interval not displayed.   Lab Results  Component Value Date   TSH 0.702 01/19/2020   Lab Results  Component Value Date   HGBA1C 4.6 01/29/2020   Lab Results  Component Value Date   CHOL 189 03/25/2010   HDL 39 (L) 03/25/2010   LDLCALC 118 (H) 03/25/2010   LDLDIRECT 44 11/22/2019   TRIG 159 (H) 03/25/2010   CHOLHDL 4.8 Ratio 03/25/2010    Significant Diagnostic Results in last 30 days:  CT ABDOMEN PELVIS WO CONTRAST  Result Date: 01/19/2020 CLINICAL DATA:  Generalized abdominal pain. EXAM: CT ABDOMEN AND PELVIS WITHOUT CONTRAST TECHNIQUE: Multidetector CT imaging of the abdomen and pelvis was performed following the standard protocol without IV contrast. COMPARISON:  December 23, 2019. FINDINGS: Lower chest: Small bilateral pleural effusions are noted. Right posterior basilar atelectasis or infiltrate is noted. Hepatobiliary: No focal liver abnormality is seen. Status post cholecystectomy. No biliary dilatation. Pancreas: Unremarkable. No pancreatic ductal dilatation or surrounding inflammatory changes. Spleen: Normal in size without focal abnormality. Adrenals/Urinary Tract: Adrenal glands are unremarkable. Kidneys are normal, without renal calculi, focal lesion, or hydronephrosis. Bladder is unremarkable. Stomach/Bowel: The stomach appears normal. There is no evidence of bowel obstruction. The appendix is not visualized. Sigmoid diverticulosis is noted. There is large amount of stool seen in the distal sigmoid colon and rectum concerning for impaction. Mild wall thickening of the rectum  is noted with surrounding inflammatory changes concerning for proctitis. Vascular/Lymphatic: Aortic atherosclerosis. No enlarged abdominal or pelvic lymph nodes. Reproductive: Uterus and  bilateral adnexa are unremarkable. Other: No abdominal wall hernia or abnormality. No abdominopelvic ascites. Musculoskeletal: No acute or significant osseous findings. IMPRESSION: 1. Small bilateral pleural effusions are noted with right posterior basilar atelectasis or infiltrate. 2. Sigmoid diverticulosis without inflammation. 3. Large amount of stool seen in the distal sigmoid colon and rectum concerning for impaction. Mild wall thickening of the rectum is noted with surrounding inflammatory changes concerning for proctitis. 4. Aortic atherosclerosis. Aortic Atherosclerosis (ICD10-I70.0). Electronically Signed   By: Marijo Conception M.D.   On: 01/19/2020 12:02   DG Abd 1 View  Result Date: 01/21/2020 CLINICAL DATA:  Fecal impaction EXAM: ABDOMEN - 1 VIEW COMPARISON:  CT abdomen/pelvis dated 01/19/2020 FINDINGS: Nonobstructive bowel gas pattern. Normal colonic stool burden. However, there is moderate stool in the rectal vault, compatible with the clinical history of fecal impaction. Cholecystectomy clips. Degenerative changes of the lumbar spine. IMPRESSION: Moderate stool in the rectal vault, compatible with the clinical history of fecal impaction. Electronically Signed   By: Julian Hy M.D.   On: 01/21/2020 04:39   CT HEAD WO CONTRAST  Result Date: 01/21/2020 CLINICAL DATA:  Altered mental status. EXAM: CT HEAD WITHOUT CONTRAST TECHNIQUE: Contiguous axial images were obtained from the base of the skull through the vertex without intravenous contrast. COMPARISON:  January 18, 2020. FINDINGS: Brain: Mild diffuse cortical atrophy is noted. Mild chronic ischemic white matter disease is noted. No mass effect or midline shift is noted. Ventricular size is within normal limits. There is no evidence of mass lesion, hemorrhage or acute infarction. Vascular: No hyperdense vessel or unexpected calcification. Skull: Normal. Negative for fracture or focal lesion. Sinuses/Orbits: No acute finding. Other: None.  IMPRESSION: Mild diffuse cortical atrophy. Mild chronic ischemic white matter disease. No acute intracranial abnormality seen. Electronically Signed   By: Marijo Conception M.D.   On: 01/21/2020 09:58   CT Head Wo Contrast  Result Date: 01/18/2020 CLINICAL DATA:  Mental status change EXAM: CT HEAD WITHOUT CONTRAST TECHNIQUE: Contiguous axial images were obtained from the base of the skull through the vertex without intravenous contrast. COMPARISON:  None. FINDINGS: Brain: No evidence of acute territorial infarction, hemorrhage, hydrocephalus,extra-axial collection or mass lesion/mass effect. There is low-attenuation changes in the deep white matter consistent with small vessel ischemia. Ventricles are normal in size and contour. Vascular: No hyperdense vessel or unexpected calcification. Skull: The skull is intact. No fracture or focal lesion identified. Sinuses/Orbits: Small amount of fluid seen within the left maxillary sinus and right sphenoid sinus. The orbits and globes intact. Other: None IMPRESSION: No acute intracranial abnormality. Findings consistent chronic small vessel ischemia Electronically Signed   By: Prudencio Pair M.D.   On: 01/18/2020 22:13   MR BRAIN WO CONTRAST  Result Date: 01/20/2020 CLINICAL DATA:  Mental status change with unknown cause. Multiple recent falls. EXAM: MRI HEAD WITHOUT CONTRAST TECHNIQUE: Multiplanar, multiecho pulse sequences of the brain and surrounding structures were obtained without intravenous contrast. COMPARISON:  This head CT from 2 days ago FINDINGS: Brain: Trace subdural hemorrhage along the left cerebral convexity, mainly inferiorly, only 2 mm in maximal thickness. A trace right occipital subdural collection could also be present. Head CT follow-up in 24 hours could be considered. No acute infarct, hydrocephalus, or masslike finding. Moderate chronic small vessel ischemia in the periventricular white matter and pons. Vascular: Normal flow voids. Skull and  upper  cervical spine: Normal marrow signal Sinuses/Orbits: No acute finding.  Bilateral cataract resection Other: These results were called by telephone at the time of interpretation on 01/20/2020 at 12:45 pm to provider [facility] , who verbally acknowledged these results. IMPRESSION: 1. Thin subdural hematoma along the left cerebral convexity (only 2 mm in thickness). This was not seen on head CT from 2 days ago but may have been too thin to detect by CT. No mass effect. 2. Probable even thinner subdural hematoma along the right occipital convexity. 3. No acute infarct. 4. Moderate motion artifact. Electronically Signed   By: Monte Fantasia M.D.   On: 01/20/2020 12:53   DG Chest Port 1 View  Result Date: 01/18/2020 CLINICAL DATA:  Altered mental status, status post fall. EXAM: PORTABLE CHEST 1 VIEW COMPARISON:  December 23, 2019 FINDINGS: Decreased lung volumes are seen which is likely, in part, secondary to the degree of patient inspiration. There is no evidence of acute infiltrate, pleural effusion or pneumothorax. Numerous radiopaque overlying cardiac lead wires are present. The heart size and mediastinal contours are within normal limits. There is marked severity calcification of the aortic arch. No acute osseous abnormalities are identified. IMPRESSION: Low lung volumes without evidence of acute or active cardiopulmonary disease. Electronically Signed   By: Virgina Norfolk M.D.   On: 01/18/2020 22:18   EEG adult  Result Date: 01/22/2020 Lora Havens, MD     01/22/2020  3:59 PM Patient Name: Chloe Harding MRN: [id number] Epilepsy Attending: Lora Havens Referring Physician/Provider: Dr Donnetta Simpers Date: 01/22/2020 Duration: 24.45 mins Patient history: 74 y.o. female with PMH significant for diabetes mellitus, CHF, chronic anemia, hyperlipidemia, hypertension  who presents with AMS and multiple falls. CTH showed no acute intracranial abnormality. MR Brain showed trace subdural hemorrhage along the  left cerebral convexity, mainly inferiorly, only 2 mm in maximal thickness. A trace right occipital subdural collection could also be present. EEG to evaluate for seizure.  Level of alertness: Awake AEDs during EEG study: LEV Technical aspects: This EEG study was done with scalp electrodes positioned according to the 10-20 International system of electrode placement. Electrical activity was acquired at a sampling rate of 500Hz  and reviewed with a high frequency filter of 70Hz  and a low frequency filter of 1Hz . EEG data were recorded continuously and digitally stored. Description: No posterior dominant rhythm was seen. EEG showed continuous generalized 3 to 6 Hz theta-delta slowing. 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 Hyperventilation and photic stimulation were not performed.   ABNORMALITY -Continuous slow, generalized -Periodic epileptiform discharges with triphasic morphology , generalized IMPRESSION: This study is suggestive of moderate to severe diffuse encephalopathy, nonspecific etiology but likely related to toxic-metabolic etiology. 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  No seizures were seen throughout the recording. [name]   DG HIPS BILAT WITH PELVIS 3-4 VIEWS  Result Date: 01/18/2020 CLINICAL DATA:  Status post fall. EXAM: DG HIP (WITH OR WITHOUT PELVIS) 3-4V BILAT COMPARISON:  None. FINDINGS: There is no evidence of hip fracture or dislocation. There is no evidence of arthropathy or other focal bone abnormality. IMPRESSION: Negative. Electronically Signed   By: Virgina Norfolk M.D.   On: 01/18/2020 22:16    Assessment/Plan  1. Subdural hematoma (HCC) -No reported seizures, completed Keppra x7 days -Repeat CT head on 01/21/2020 showed no hematoma  2. Ulcer of esophagus with bleeding -No reported  bleeding -Continue pantoprazole and sucralfate  3. RLS (restless legs syndrome) -Stable, continue ropinirole  4. Essential hypertension -Stable, continue metoprolol  5. Chronic combined systolic and diastolic heart failure (HCC) -No SOB, continue furosemide  6. Major depression, recurrent, chronic (HCC) -Mood this is stable, continue Escitalopram  7. Neurocognitive deficits - BIMS score 5/15, ranging in severe cognitive impairment -Continuous supportive care    Family/ staff Communication: Discussed plan of care with charge nurse.  Labs/tests ordered: None  Goals of care:   Short-term care   Durenda Age, DNP, MSN, FNP-BC [facility] [phone] (Monday-Friday 8:00 a.m. - 5:00 p.m.) [phone] (after hours)

## 2020-02-13 ENCOUNTER — Non-Acute Institutional Stay (SKILLED_NURSING_FACILITY): Payer: Medicare Other | Admitting: Adult Health

## 2020-02-13 ENCOUNTER — Encounter: Payer: Self-pay | Admitting: Adult Health

## 2020-02-13 DIAGNOSIS — R29818 Other symptoms and signs involving the nervous system: Secondary | ICD-10-CM

## 2020-02-13 DIAGNOSIS — T83511S Infection and inflammatory reaction due to indwelling urethral catheter, sequela: Secondary | ICD-10-CM

## 2020-02-13 DIAGNOSIS — K5901 Slow transit constipation: Secondary | ICD-10-CM

## 2020-02-13 DIAGNOSIS — G2581 Restless legs syndrome: Secondary | ICD-10-CM

## 2020-02-13 DIAGNOSIS — I5042 Chronic combined systolic (congestive) and diastolic (congestive) heart failure: Secondary | ICD-10-CM | POA: Diagnosis not present

## 2020-02-13 DIAGNOSIS — I10 Essential (primary) hypertension: Secondary | ICD-10-CM | POA: Diagnosis not present

## 2020-02-13 DIAGNOSIS — K2211 Ulcer of esophagus with bleeding: Secondary | ICD-10-CM

## 2020-02-13 DIAGNOSIS — F339 Major depressive disorder, recurrent, unspecified: Secondary | ICD-10-CM

## 2020-02-13 DIAGNOSIS — R339 Retention of urine, unspecified: Secondary | ICD-10-CM

## 2020-02-13 DIAGNOSIS — S065X9A Traumatic subdural hemorrhage with loss of consciousness of unspecified duration, initial encounter: Secondary | ICD-10-CM | POA: Diagnosis not present

## 2020-02-13 DIAGNOSIS — S065XAA Traumatic subdural hemorrhage with loss of consciousness status unknown, initial encounter: Secondary | ICD-10-CM

## 2020-02-13 DIAGNOSIS — N390 Urinary tract infection, site not specified: Secondary | ICD-10-CM

## 2020-02-13 DIAGNOSIS — E7800 Pure hypercholesterolemia, unspecified: Secondary | ICD-10-CM

## 2020-02-13 DIAGNOSIS — M129 Arthropathy, unspecified: Secondary | ICD-10-CM

## 2020-02-13 DIAGNOSIS — R4189 Other symptoms and signs involving cognitive functions and awareness: Secondary | ICD-10-CM

## 2020-02-13 NOTE — Progress Notes (Addendum)
Location:  [place] Room Number: [place] of Service:  SNF (31) Provider:  Durenda Age, DNP, FNP-BC  Patient Care Team: [name], NP as PCP - General (Nurse Practitioner) [name], MD as PCP - Cardiology (Cardiology)  Extended Emergency Contact Information Primary Emergency Contact: ChloeRichard Address: [place], [place] [address] Chloe Harding of [place] Phone: [phone] Mobile Phone: [phone] Relation: Spouse Secondary Emergency Contact: Harding,Jenifer  [place] Phone: [phone] Mobile Phone: [phone] Relation: Daughter  Code Status:   Full Code  Goals of care: Advanced Directive information Advanced Directives 02/06/2020  Does Patient Have a Medical Advance Directive? No  Type of Advance Directive -  Does patient want to make changes to medical advance directive? -  Copy of Chloe Harding in Chart? -  Would patient like information on creating a medical advance directive? No - Patient declined     Chief Complaint  Patient presents with  . Discharge Note    For discharge home on 02/15/20   Addendum:   Resident was not discharged home on 02/15/20 but was transferred to the hospital for acute on chronic kidney disease, creatinine up from 1.30 to 2.65. Staff was unable to put IV and non-emergent EMS started IV. However, she pulled out IV when she rolled out of bed early morning of 02/15/20.   HPI:  Pt is a 74 y.o. female seen today for probable discharge home on 02/15/20 with Home health PT, OT and Nurse for foley catheter care.    She was admitted to [facility] on 01/26/20 post hospitalization 01/18/20 to 01/26/2020 for acute metabolic encephalopathy of unclear etiology.  MRI and EEG without positive findings.  Neurology was consulted.  Sedative medications including gabapentin, Flexeril and pain medications were held.  MRI  showed 2 mm small hematoma and probable 1 smaller SDH along the right occiput.  Repeat CT head on 01/21/2020 showed no hematoma.  She was started on prophylactic Keppra on 01/21/20 for a total of 7 days.  EEG showed no seizures.  CT abdomen showed impacted stool and was treated with laxatives.  She was found to have heme positive stool.  She was continued on PPI and to avoid anticoagulations.  EGD with dilation was done on 01/25/2020.  GI recommended PPI twice a day indefinitely along with sucralfate x1 month.  She followed up with GI, Dr. Benson Norway, on 02/12/2020 and was continued on pantoprazole 40 mg twice a day indefinitely and to follow-up in 3 months.  Foley catheter was discontinued for trial of voiding but was reinserted due to urinary retention.  She is currently taking levofloxacin for UTI and will be continued for a total of 7 days, last day on 02/15/20. She was referred for a urology consult.    Past Medical History:  Diagnosis Date  . Arthritis   . Depression   . Diabetes mellitus   . Hiatal hernia   . Hypercholesteremia   . Hypertension   . Reflux   . Renal disorder    shutting down 4 years ago   Past Surgical History:  Procedure Laterality Date  . BALLOON DILATION N/A 01/25/2020   Procedure: BALLOON DILATION;  Surgeon: Carol Ada, MD;  Location: [facility];  Service: Endoscopy;  Laterality: N/A;  . CHOLECYSTECTOMY    . COLONOSCOPY WITH PROPOFOL N/A 05/20/2017   Procedure: COLONOSCOPY WITH PROPOFOL;  Surgeon: Carol Ada, MD;  Location: [facility] ENDOSCOPY;  Service: Endoscopy;  Laterality: N/A;  . ESOPHAGOGASTRODUODENOSCOPY N/A 05/19/2017   Procedure: ESOPHAGOGASTRODUODENOSCOPY (EGD);  Surgeon: Juanita Craver, MD;  Location: [facility] ENDOSCOPY;  Service: Endoscopy;  Laterality: N/A;  . ESOPHAGOGASTRODUODENOSCOPY (EGD) WITH PROPOFOL N/A 01/17/2019   Procedure: ESOPHAGOGASTRODUODENOSCOPY (EGD) WITH PROPOFOL;  Surgeon: Carol Ada, MD;  Location: [facility] ENDOSCOPY;  Service: Endoscopy;  Laterality: N/A;    . ESOPHAGOGASTRODUODENOSCOPY (EGD) WITH PROPOFOL N/A 01/25/2020   Procedure: ESOPHAGOGASTRODUODENOSCOPY (EGD) WITH PROPOFOL;  Surgeon: Carol Ada, MD;  Location: [facility] ENDOSCOPY;  Service: Endoscopy;  Laterality: N/A;  . SAVORY DILATION N/A 01/17/2019   Procedure: SAVORY DILATION;  Surgeon: Carol Ada, MD;  Location: [facility] ENDOSCOPY;  Service: Endoscopy;  Laterality: N/A;    Allergies  Allergen Reactions  . Amlodipine Besylate Swelling    swelling    Outpatient Encounter Medications as of 02/13/2020  Medication Sig  . atorvastatin (LIPITOR) 20 MG tablet Take 1 tablet (20 mg total) by mouth daily.  . bisacodyl (DULCOLAX) 5 MG EC tablet Take 1 tablet (5 mg total) by mouth daily as needed for moderate constipation.  . CONSTULOSE 10 GM/15ML solution Take 10 g by mouth daily as needed for mild constipation. Lactulose  . escitalopram (LEXAPRO) 10 MG tablet Take 10 mg by mouth daily.  . ferrous sulfate 325 (65 FE) MG tablet Take 1 tablet (325 mg total) by mouth 2 (two) times daily with a meal.  . folic acid (FOLVITE) 1 MG tablet Take 1 mg by mouth daily.  . furosemide (LASIX) 40 MG tablet Take 1 tablet (40 mg total) by mouth daily.  . Glucagon HCl 1 MG SOLR Inject 1 mg as directed as needed.  . hydroxychloroquine (PLAQUENIL) 200 MG tablet Take 200 mg by mouth daily.  . metoprolol tartrate (LOPRESSOR) 25 MG tablet Take 1 tablet (25 mg total) by mouth 2 (two) times daily.  . Nutritional Supplement LIQD Take by mouth in the morning, at noon, and at bedtime. MedPass  . Nutritional Supplements (NUTRITIONAL SUPPLEMENT PO) Take 1 each by mouth in the morning and at bedtime. Magic Cup  . ondansetron (ZOFRAN) 4 MG tablet Take 4 mg by mouth every 8 (eight) hours as needed for nausea.  . pantoprazole (PROTONIX) 40 MG tablet Take 1 tablet (40 mg total) by mouth 2 (two) times daily.  . polyethylene glycol (MIRALAX / GLYCOLAX) 17 g packet Take 17 g by mouth daily as needed for moderate constipation or severe  constipation.  . potassium chloride (KLOR-CON) 10 MEQ tablet Take 10 mEq by mouth daily.  . ropinirole (REQUIP) 5 MG tablet Take 5 mg by mouth at bedtime.  . senna-docusate (SENOKOT-S) 8.6-50 MG tablet Take 1 tablet by mouth at bedtime.  . sucralfate (CARAFATE) 1 g tablet Take 1 tablet (1 g total) by mouth 4 (four) times daily -  with meals and at bedtime.  . Vitamin D3 (VITAMIN D) 25 MCG tablet Take 1 tablet (1,000 Units total) by mouth daily.   No facility-administered encounter medications on file as of 02/13/2020.    Review of Systems unable to obtain due to cognitive impairment.    Immunization History  Administered Date(s) Administered  . Influenza Whole 03/11/2010  . Pneumococcal Conjugate-13 08/24/2017  . Td 07/16/2010  . Zoster Recombinat (Shingrix) 02/27/2018   Pertinent  Health Maintenance Due  Topic Date Due  . FOOT EXAM  Never done  . OPHTHALMOLOGY EXAM  Never done  . URINE MICROALBUMIN  05/17/2011  . PNA vac Low Risk Adult (2 of  2 - PPSV23) 08/25/2018  . MAMMOGRAM  11/16/2019  . INFLUENZA VACCINE  12/17/2019  . HEMOGLOBIN A1C  07/28/2020  . COLONOSCOPY  05/21/2027  . DEXA SCAN  Completed   No flowsheet data found.   Vitals:   02/13/20 1100  BP: 124/60  Pulse: 88  Resp: 16  Temp: (!) 97.1 F (36.2 C)  Weight: 148 lb 6.4 oz (67.3 kg)  Height: 5\' 4"  (1.626 m)   Body mass index is 25.47 kg/m.  Physical Exam  GENERAL APPEARANCE: Well nourished. In no acute distress. Normal body habitus SKIN:  Skin is warm and dry.  MOUTH and THROAT: Lips are without lesions. Oral mucosa is moist and without lesions. Tongue is normal in shape, size, and color and without lesions RESPIRATORY: Breathing is even & unlabored, BS CTAB CARDIAC: RRR, no murmur,no extra heart sounds, no edema GI: Abdomen soft, normal BS, no masses, no tenderness GU:  Has foley catheter draining to urine bag with clear yellowish urine EXTREMITIES:  Able to move X 4 extremities NEUROLOGICAL:  There is no tremor. Speech is clear. Alert to self, disoriented to time and place. PSYCHIATRIC:  Affect and behavior are appropriate  Labs reviewed: Recent Labs    11/08/19 0344 11/09/19 0337 01/21/20 0315 01/21/20 0315 01/22/20 0410 01/22/20 0410 01/24/20 0341 01/25/20 0334 01/26/20 0340  NA 134*   < > 140   < > 139   < > 135 137 137  K 3.4*   < > 3.4*   < > 3.8   < > 3.6 3.3* 3.8  CL 102   < > 110   < > 111   < > 107 109 109  CO2 23   < > 22   < > 23   < > 23 22 23   GLUCOSE 116*   < > 114*   < > 106*   < > 102* 68* 90  BUN 12   < > 10   < > 7*   < > <5* <5* <5*  CREATININE 0.95   < > 0.70   < > 0.60   < > 0.67 0.67 0.70  CALCIUM 8.8*   < > 8.5*   < > 7.9*   < > 7.3* 7.6* 7.6*  MG 1.8   < > 1.2*   < > 1.8  --   --  1.3* 1.7  PHOS 3.1  --  1.8*  --  2.6  --   --   --   --    < > = values in this interval not displayed.   Recent Labs    12/25/19 0237 12/26/19 0540 12/27/19 0430  AST 13* 14* 19  ALT 8 7 10   ALKPHOS 68 63 54  BILITOT 0.5 0.5 0.4  PROT 5.1* 5.4* 5.3*  ALBUMIN 2.6* 2.8* 2.8*   Recent Labs    01/18/20 2032 01/19/20 0330 01/24/20 0341 01/25/20 0334 01/26/20 0340  WBC 6.7   < > 4.9 4.9 5.3  NEUTROABS 2.9  --   --  2.2 2.4  HGB 12.4   < > 9.0* 9.4* 10.0*  HCT 37.4   < > 26.4* 28.7* 30.1*  MCV 84.4   < > 84.9 85.4 84.1  PLT 240   < > 196 225 265   < > = values in this interval not displayed.   Lab Results  Component Value Date   TSH 0.702 01/19/2020   Lab Results  Component Value Date   HGBA1C 4.6  01/29/2020   Lab Results  Component Value Date   CHOL 189 03/25/2010   HDL 39 (L) 03/25/2010   LDLCALC 118 (H) 03/25/2010   LDLDIRECT 44 11/22/2019   TRIG 159 (H) 03/25/2010   CHOLHDL 4.8 Ratio 03/25/2010    Significant Diagnostic Results in last 30 days:  CT ABDOMEN PELVIS WO CONTRAST  Result Date: 01/19/2020 CLINICAL DATA:  Generalized abdominal pain. EXAM: CT ABDOMEN AND PELVIS WITHOUT CONTRAST TECHNIQUE: Multidetector CT imaging of the  abdomen and pelvis was performed following the standard protocol without IV contrast. COMPARISON:  December 23, 2019. FINDINGS: Lower chest: Small bilateral pleural effusions are noted. Right posterior basilar atelectasis or infiltrate is noted. Hepatobiliary: No focal liver abnormality is seen. Status post cholecystectomy. No biliary dilatation. Pancreas: Unremarkable. No pancreatic ductal dilatation or surrounding inflammatory changes. Spleen: Normal in size without focal abnormality. Adrenals/Urinary Tract: Adrenal glands are unremarkable. Kidneys are normal, without renal calculi, focal lesion, or hydronephrosis. Bladder is unremarkable. Stomach/Bowel: The stomach appears normal. There is no evidence of bowel obstruction. The appendix is not visualized. Sigmoid diverticulosis is noted. There is large amount of stool seen in the distal sigmoid colon and rectum concerning for impaction. Mild wall thickening of the rectum is noted with surrounding inflammatory changes concerning for proctitis. Vascular/Lymphatic: Aortic atherosclerosis. No enlarged abdominal or pelvic lymph nodes. Reproductive: Uterus and bilateral adnexa are unremarkable. Other: No abdominal wall hernia or abnormality. No abdominopelvic ascites. Musculoskeletal: No acute or significant osseous findings. IMPRESSION: 1. Small bilateral pleural effusions are noted with right posterior basilar atelectasis or infiltrate. 2. Sigmoid diverticulosis without inflammation. 3. Large amount of stool seen in the distal sigmoid colon and rectum concerning for impaction. Mild wall thickening of the rectum is noted with surrounding inflammatory changes concerning for proctitis. 4. Aortic atherosclerosis. Aortic Atherosclerosis (ICD10-I70.0). Electronically Signed   By: Marijo Conception M.D.   On: 01/19/2020 12:02   DG Abd 1 View  Result Date: 01/21/2020 CLINICAL DATA:  Fecal impaction EXAM: ABDOMEN - 1 VIEW COMPARISON:  CT abdomen/pelvis dated 01/19/2020 FINDINGS:  Nonobstructive bowel gas pattern. Normal colonic stool burden. However, there is moderate stool in the rectal vault, compatible with the clinical history of fecal impaction. Cholecystectomy clips. Degenerative changes of the lumbar spine. IMPRESSION: Moderate stool in the rectal vault, compatible with the clinical history of fecal impaction. Electronically Signed   By: Julian Hy M.D.   On: 01/21/2020 04:39   CT HEAD WO CONTRAST  Result Date: 01/21/2020 CLINICAL DATA:  Altered mental status. EXAM: CT HEAD WITHOUT CONTRAST TECHNIQUE: Contiguous axial images were obtained from the base of the skull through the vertex without intravenous contrast. COMPARISON:  January 18, 2020. FINDINGS: Brain: Mild diffuse cortical atrophy is noted. Mild chronic ischemic white matter disease is noted. No mass effect or midline shift is noted. Ventricular size is within normal limits. There is no evidence of mass lesion, hemorrhage or acute infarction. Vascular: No hyperdense vessel or unexpected calcification. Skull: Normal. Negative for fracture or focal lesion. Sinuses/Orbits: No acute finding. Other: None. IMPRESSION: Mild diffuse cortical atrophy. Mild chronic ischemic white matter disease. No acute intracranial abnormality seen. Electronically Signed   By: Marijo Conception M.D.   On: 01/21/2020 09:58   CT Head Wo Contrast  Result Date: 01/18/2020 CLINICAL DATA:  Mental status change EXAM: CT HEAD WITHOUT CONTRAST TECHNIQUE: Contiguous axial images were obtained from the base of the skull through the vertex without intravenous contrast. COMPARISON:  None. FINDINGS: Brain: No evidence of acute  territorial infarction, hemorrhage, hydrocephalus,extra-axial collection or mass lesion/mass effect. There is low-attenuation changes in the deep white matter consistent with small vessel ischemia. Ventricles are normal in size and contour. Vascular: No hyperdense vessel or unexpected calcification. Skull: The skull is intact. No  fracture or focal lesion identified. Sinuses/Orbits: Small amount of fluid seen within the left maxillary sinus and right sphenoid sinus. The orbits and globes intact. Other: None IMPRESSION: No acute intracranial abnormality. Findings consistent chronic small vessel ischemia Electronically Signed   By: Prudencio Pair M.D.   On: 01/18/2020 22:13   MR BRAIN WO CONTRAST  Result Date: 01/20/2020 CLINICAL DATA:  Mental status change with unknown cause. Multiple recent falls. EXAM: MRI HEAD WITHOUT CONTRAST TECHNIQUE: Multiplanar, multiecho pulse sequences of the brain and surrounding structures were obtained without intravenous contrast. COMPARISON:  This head CT from 2 days ago FINDINGS: Brain: Trace subdural hemorrhage along the left cerebral convexity, mainly inferiorly, only 2 mm in maximal thickness. A trace right occipital subdural collection could also be present. Head CT follow-up in 24 hours could be considered. No acute infarct, hydrocephalus, or masslike finding. Moderate chronic small vessel ischemia in the periventricular white matter and pons. Vascular: Normal flow voids. Skull and upper cervical spine: Normal marrow signal Sinuses/Orbits: No acute finding.  Bilateral cataract resection Other: These results were called by telephone at the time of interpretation on 01/20/2020 at 12:45 pm to provider [facility] , who verbally acknowledged these results. IMPRESSION: 1. Thin subdural hematoma along the left cerebral convexity (only 2 mm in thickness). This was not seen on head CT from 2 days ago but may have been too thin to detect by CT. No mass effect. 2. Probable even thinner subdural hematoma along the right occipital convexity. 3. No acute infarct. 4. Moderate motion artifact. Electronically Signed   By: Monte Fantasia M.D.   On: 01/20/2020 12:53   DG Chest Port 1 View  Result Date: 01/18/2020 CLINICAL DATA:  Altered mental status, status post fall. EXAM: PORTABLE CHEST 1 VIEW COMPARISON:  December 23, 2019 FINDINGS: Decreased lung volumes are seen which is likely, in part, secondary to the degree of patient inspiration. There is no evidence of acute infiltrate, pleural effusion or pneumothorax. Numerous radiopaque overlying cardiac lead wires are present. The heart size and mediastinal contours are within normal limits. There is marked severity calcification of the aortic arch. No acute osseous abnormalities are identified. IMPRESSION: Low lung volumes without evidence of acute or active cardiopulmonary disease. Electronically Signed   By: Virgina Norfolk M.D.   On: 01/18/2020 22:18   EEG adult  Result Date: 01/22/2020 Lora Havens, MD     01/22/2020  3:59 PM Patient Name: Chloe Harding MRN: [id number] Epilepsy Attending: Lora Havens Referring Physician/Provider: Dr Donnetta Simpers Date: 01/22/2020 Duration: 24.45 mins Patient history: 74 y.o. female with PMH significant for diabetes mellitus, CHF, chronic anemia, hyperlipidemia, hypertension  who presents with AMS and multiple falls. CTH showed no acute intracranial abnormality. MR Brain showed trace subdural hemorrhage along the left cerebral convexity, mainly inferiorly, only 2 mm in maximal thickness. A trace right occipital subdural collection could also be present. EEG to evaluate for seizure.  Level of alertness: Awake AEDs during EEG study: LEV Technical aspects: This EEG study was done with scalp electrodes positioned according to the 10-20 International system of electrode placement. Electrical activity was acquired at a sampling rate of 500Hz  and reviewed with a high frequency filter of 70Hz  and a low  frequency filter of 1Hz . EEG data were recorded continuously and digitally stored. Description: No posterior dominant rhythm was seen. EEG showed continuous generalized 3 to 6 Hz theta-delta slowing. 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 Hyperventilation and photic stimulation were not performed.    ABNORMALITY -Continuous slow, generalized -Periodic epileptiform discharges with triphasic morphology , generalized IMPRESSION: This study is suggestive of moderate to severe diffuse encephalopathy, nonspecific etiology but likely related to toxic-metabolic etiology. 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  No seizures were seen throughout the recording. [name]   DG HIPS BILAT WITH PELVIS 3-4 VIEWS  Result Date: 01/18/2020 CLINICAL DATA:  Status post fall. EXAM: DG HIP (WITH OR WITHOUT PELVIS) 3-4V BILAT COMPARISON:  None. FINDINGS: There is no evidence of hip fracture or dislocation. There is no evidence of arthropathy or other focal bone abnormality. IMPRESSION: Negative. Electronically Signed   By: Virgina Norfolk M.D.   On: 01/18/2020 22:16    Assessment/Plan  1. Subdural hematoma (HCC) -MRI showed 2 mm small hematoma and probable 1 smaller SDH along the right occiput.  Repeat CT head on 01/21/2020 showed no hematoma.  She completed prophylactic Keppra x7 days.  EEG showed no seizures -Neurology signed off  2. Chronic combined systolic and diastolic heart failure (HCC) - metoprolol tartrate (LOPRESSOR) 25 MG tablet; Take 1 tablet (25 mg total) by mouth 2 (two) times daily.  Dispense: 60 tablet; Refill: 0 - furosemide (LASIX) 40 MG tablet; Take 1 tablet (40 mg total) by mouth daily.  Dispense: 30 tablet; Refill: 0 - potassium chloride (KLOR-CON) 10 MEQ tablet; Take 1 tablet (10 mEq total) by mouth daily.  Dispense: 30 tablet; Refill: 0  3. Essential hypertension -  - metoprolol tartrate (LOPRESSOR) 25 MG tablet; Take 1 tablet (25 mg total) by mouth 2 (two) times daily.  Dispense: 60 tablet; Refill: 0  4. Urinary retention -Foley catheter was reinserted due to urinary retention -Referred for urology consult  5. Ulcer of esophagus with  bleeding -Follow-up with GI, Dr. Benson Norway, in 4 weeks - pantoprazole (PROTONIX) 40 MG tablet; Take 1 tablet (40 mg total) by mouth 2 (two) times daily.  Dispense: 60 tablet; Refill: 0 - sucralfate (CARAFATE) 1 g tablet; Take 1 tablet (1 g total) by mouth 4 (four) times daily -  with meals and at bedtime for 14 days.  Dispense: 56 tablet; Refill: 0  6. RLS (restless legs syndrome) - ropinirole (REQUIP) 5 MG tablet; Take 1 tablet (5 mg total) by mouth at bedtime.  Dispense: 30 tablet; Refill: 0  7. Major depression, recurrent, chronic (HCC) - escitalopram (LEXAPRO) 10 MG tablet; Take 1 tablet (10 mg total) by mouth daily.  Dispense: 30 tablet; Refill: 0  8. Neurocognitive deficits -  BIMS score 5/15, ranging in severe cognitive imparment -Continue supportive care  9. Slow transit constipation - CONSTULOSE 10 GM/15ML solution; Take 15 mLs (10 g total) by mouth daily as needed for mild constipation. Lactulose  Dispense: 236 mL; Refill: 0  10. Pure hypercholesterolemia - atorvastatin (LIPITOR) 20 MG tablet; Take 1 tablet (20 mg total) by mouth daily.  Dispense: 30 tablet; Refill: 0  11. Arthropathy - hydroxychloroquine (PLAQUENIL) 200 MG tablet; Take 1 tablet (200 mg total) by mouth daily.  Dispense: 30 tablet; Refill: 0   12. UTI -  Last dose of Levofloxacin on 02/15/20   I  have filled out patient's discharge paperwork and e-prescribed medications at.  Patient will have home health PT, OT and Nurse for foley catheter care.  DME provided:  Foley catheter fr 16 and supplies  Total discharge time: Greater than 30 minutes  Discharge time involved coordination of the discharge process with Education officer, museum, nursing staff and therapy department. Medical justification for home health services and DME  verified.    Durenda Age, DNP, MSN, FNP-BC [facility] [phone] (Monday-Friday 8:00 a.m. - 5:00 p.m.) [phone] (after hours)

## 2020-02-14 ENCOUNTER — Encounter: Payer: Self-pay | Admitting: Adult Health

## 2020-02-14 MED ORDER — VITAMIN D3 25 MCG PO TABS
1000.0000 [IU] | ORAL_TABLET | Freq: Every day | ORAL | 0 refills | Status: DC
Start: 2020-02-14 — End: 2020-07-10

## 2020-02-14 MED ORDER — SUCRALFATE 1 G PO TABS: 1.0000 g | ORAL_TABLET | Freq: Three times a day (TID) | ORAL | 0 refills | Status: DC

## 2020-02-14 MED ORDER — ROPINIROLE HCL 5 MG PO TABS: 5.0000 mg | ORAL_TABLET | Freq: Every day | ORAL | 0 refills | Status: DC

## 2020-02-14 MED ORDER — CONSTULOSE 10 GM/15ML PO SOLN: 10.0000 g | Freq: Every day | ORAL | 0 refills | Status: DC | PRN

## 2020-02-14 MED ORDER — METOPROLOL TARTRATE 25 MG PO TABS: 25.0000 mg | ORAL_TABLET | Freq: Two times a day (BID) | ORAL | 0 refills | Status: DC

## 2020-02-14 MED ORDER — POTASSIUM CHLORIDE ER 10 MEQ PO TBCR: 10.0000 meq | EXTENDED_RELEASE_TABLET | Freq: Every day | ORAL | 0 refills | Status: DC

## 2020-02-14 MED ORDER — HYDROXYCHLOROQUINE SULFATE 200 MG PO TABS: 200.0000 mg | ORAL_TABLET | Freq: Every day | ORAL | 0 refills | Status: DC

## 2020-02-14 MED ORDER — FUROSEMIDE 40 MG PO TABS: 40.0000 mg | ORAL_TABLET | Freq: Every day | ORAL | 0 refills | Status: DC

## 2020-02-14 MED ORDER — PANTOPRAZOLE SODIUM 40 MG PO TBEC: 40.0000 mg | DELAYED_RELEASE_TABLET | Freq: Two times a day (BID) | ORAL | 0 refills | Status: DC

## 2020-02-14 MED ORDER — ATORVASTATIN CALCIUM 20 MG PO TABS: 20.0000 mg | ORAL_TABLET | Freq: Every day | ORAL | 0 refills | Status: DC

## 2020-02-14 MED ORDER — FOLIC ACID 1 MG PO TABS
1.0000 mg | ORAL_TABLET | Freq: Every day | ORAL | 0 refills | Status: DC
Start: 2020-02-14 — End: 2021-11-10

## 2020-02-14 MED ORDER — ESCITALOPRAM OXALATE 10 MG PO TABS: 10.0000 mg | ORAL_TABLET | Freq: Every day | ORAL | 0 refills | Status: DC

## 2020-02-14 NOTE — Progress Notes (Deleted)
Location:      Place of Service:    Provider:  Durenda Age, Craig, FNP-BC  Patient Care Team: [name], NP as PCP - General (Nurse Practitioner) [name], MD as PCP - Cardiology (Cardiology)  Extended Emergency Contact Information Primary Emergency Contact: Mcneff,[name] Address: [place], [place] [address] Johnnette Litter of [place] Phone: [phone] Mobile Phone: [phone] Relation: Spouse Secondary Emergency Contact: Arlice Colt [place] of [place] Phone: [phone] Mobile Phone: [phone] Relation: Daughter  Code Status:  ***  Goals of care: Advanced Directive information Advanced Directives 02/06/2020  Does Patient Have a Medical Advance Directive? No  Type of Advance Directive -  Does patient want to make changes to medical advance directive? -  Copy of [place] in Chart? -  Would patient like information on creating a medical advance directive? No - Patient declined     Chief Complaint  Patient presents with  . Discharge Note    Patient seen for discharge home.     HPI:  Pt is a 74 y.o. female seen today for a discharge visit. She was admitted to [facility] for short-term rehabilitation. She has a PMH of   Past Medical History:  Diagnosis Date  . Arthritis   . Depression   . Diabetes mellitus   . Hiatal hernia   . Hypercholesteremia   . Hypertension   . Reflux   . Renal disorder    shutting down 4 years ago   Past Surgical History:  Procedure Laterality Date  . BALLOON DILATION N/A 01/25/2020   Procedure: BALLOON DILATION;  Surgeon: Carol Ada, MD;  Location: [facility];  Service: Endoscopy;  Laterality: N/A;  . CHOLECYSTECTOMY    . COLONOSCOPY WITH PROPOFOL N/A 05/20/2017   Procedure: COLONOSCOPY WITH PROPOFOL;  Surgeon: Carol Ada, MD;  Location: [facility];  Service: Endoscopy;  Laterality: N/A;  . ESOPHAGOGASTRODUODENOSCOPY N/A 05/19/2017   Procedure:  ESOPHAGOGASTRODUODENOSCOPY (EGD);  Surgeon: Juanita Craver, MD;  Location[facility] END[facility];  Service: Endoscopy;  Laterality: N/A;  . ESOPHAGOGASTRODUODENOSCOPY (EGD) WITH PROPOFOL N/A 01/17/2019   Procedure: ESOPHAGOGASTRODUODENOSCOPY (EGD) WITH PROPOFOL;  Surgeon: Carol Ada, MD;  Location: [facility] ENDOSCOPY;  Service: Endoscopy;  Laterality: N/A;  . ESOPHAGOGASTRODUODENOSCOPY (EGD) WITH PROPOFOL N/A 01/25/2020   Procedure: ESOPHAGOGASTRODUODENOSCOPY (EGD) WITH PROPOFOL;  Surgeon: Carol Ada, MD;  Location: [facility] ENDOSCOPY;  Service: Endoscopy;  Laterality: N/A;  . SAVORY DILATION N/A 01/17/2019   Procedure: SAVORY DILATION;  Surgeon: Carol Ada, MD;  Location: [facility] ENDOSCOPY;  Service: Endoscopy;  Laterality: N/A;    Allergies  Allergen Reactions  . Amlodipine Besylate Swelling    swelling    Outpatient Encounter Medications as of 02/13/2020  Medication Sig  . acetaminophen (TYLENOL) 325 MG tablet Take 650 mg by mouth every 6 (six) hours as needed.  Marland Kitchen atorvastatin (LIPITOR) 20 MG tablet Take 1 tablet (20 mg total) by mouth daily.  . bisacodyl (DULCOLAX) 5 MG EC tablet Take 1 tablet (5 mg total) by mouth daily as needed for moderate constipation.  . CONSTULOSE 10 GM/15ML solution Take 15 mLs (10 g total) by mouth daily as needed for mild constipation. Lactulose  . escitalopram (LEXAPRO) 10 MG tablet Take 1 tablet (10 mg total) by mouth daily.  . folic acid (FOLVITE) 1 MG tablet Take 1 tablet (1 mg total) by mouth daily.  . furosemide (LASIX) 40 MG tablet Take 1 tablet (40 mg total) by mouth daily.  Marland Kitchen  glucagon (GLUCAGEN) 1 MG SOLR injection Inject 1 mg into the vein once as needed for low blood sugar.  . hydroxychloroquine (PLAQUENIL) 200 MG tablet Take 1 tablet (200 mg total) by mouth daily.  . metoprolol tartrate (LOPRESSOR) 25 MG tablet Take 1 tablet (25 mg total) by mouth 2 (two) times daily.  . Nutritional Supplement LIQD Take by mouth in the morning, at noon, and at bedtime. MedPass  .  Nutritional Supplements (NUTRITIONAL SUPPLEMENT PO) Take 1 each by mouth daily. Magic Cup   . ondansetron (ZOFRAN) 4 MG tablet Take 4 mg by mouth every 8 (eight) hours as needed for nausea.  . pantoprazole (PROTONIX) 40 MG tablet Take 1 tablet (40 mg total) by mouth 2 (two) times daily.  . polyethylene glycol (MIRALAX / GLYCOLAX) 17 g packet Take 17 g by mouth daily as needed for moderate constipation or severe constipation.  . potassium chloride (KLOR-CON) 10 MEQ tablet Take 1 tablet (10 mEq total) by mouth daily.  . ropinirole (REQUIP) 5 MG tablet Take 1 tablet (5 mg total) by mouth at bedtime.  . saccharomyces boulardii (FLORASTOR) 250 MG capsule Take 250 mg by mouth daily.  Marland Kitchen senna-docusate (SENOKOT-S) 8.6-50 MG tablet Take 1 tablet by mouth at bedtime.  . sucralfate (CARAFATE) 1 g tablet Take 1 tablet (1 g total) by mouth 4 (four) times daily -  with meals and at bedtime for 14 days.  . Vitamin D3 (VITAMIN D) 25 MCG tablet Take 1 tablet (1,000 Units total) by mouth daily.  . [DISCONTINUED] atorvastatin (LIPITOR) 20 MG tablet Take 1 tablet (20 mg total) by mouth daily.  . [DISCONTINUED] CONSTULOSE 10 GM/15ML solution Take 10 g by mouth daily as needed for mild constipation. Lactulose  . [DISCONTINUED] escitalopram (LEXAPRO) 10 MG tablet Take 10 mg by mouth daily.  . [DISCONTINUED] ferrous sulfate 325 (65 FE) MG tablet Take 1 tablet (325 mg total) by mouth 2 (two) times daily with a meal.  . [DISCONTINUED] folic acid (FOLVITE) 1 MG tablet Take 1 mg by mouth daily.  . [DISCONTINUED] furosemide (LASIX) 40 MG tablet Take 1 tablet (40 mg total) by mouth daily.  . [DISCONTINUED] Glucagon HCl 1 MG SOLR Inject 1 mg as directed as needed.  . [DISCONTINUED] hydroxychloroquine (PLAQUENIL) 200 MG tablet Take 200 mg by mouth daily.  . [DISCONTINUED] metoprolol tartrate (LOPRESSOR) 25 MG tablet Take 1 tablet (25 mg total) by mouth 2 (two) times daily.  . [DISCONTINUED] pantoprazole (PROTONIX) 40 MG tablet  Take 1 tablet (40 mg total) by mouth 2 (two) times daily.  . [DISCONTINUED] potassium chloride (KLOR-CON) 10 MEQ tablet Take 10 mEq by mouth daily.  . [DISCONTINUED] ropinirole (REQUIP) 5 MG tablet Take 5 mg by mouth at bedtime.  . [DISCONTINUED] sucralfate (CARAFATE) 1 g tablet Take 1 tablet (1 g total) by mouth 4 (four) times daily -  with meals and at bedtime.  . [DISCONTINUED] Vitamin D3 (VITAMIN D) 25 MCG tablet Take 1 tablet (1,000 Units total) by mouth daily.   No facility-administered encounter medications on file as of 02/13/2020.    Review of Systems  GENERAL: No change in appetite, no fatigue, no weight changes, no fever, chills or weakness SKIN: Denies rash, itching, wounds, ulcer sores, or nail abnormalities EYES: Denies change in vision, dry eyes, eye pain, itching or discharge EARS: Denies change in hearing, ringing in ears, or earache NOSE: Denies nasal congestion or epistaxis MOUTH and THROAT: Denies oral discomfort, gingival pain or bleeding, pain from teeth or  hoarseness   RESPIRATORY: no cough, SOB, DOE, wheezing, hemoptysis CARDIAC: No chest pain, edema or palpitations GI: No abdominal pain, diarrhea, constipation, heart burn, nausea or vomiting GU: Denies dysuria, frequency, hematuria, incontinence, or discharge MUSCULOSKELETAL: Denies joint pain, muscle pain, back pain, restricted movement, or unusual weakness CIRCULATION: Denies claudication, edema of legs, varicosities, or cold extremities NEUROLOGICAL: Denies dizziness, syncope, numbness, or headache PSYCHIATRIC: Denies feelings of depression or anxiety. No report of hallucinations, insomnia, paranoia, or agitation ENDOCRINE: Denies polyphagia, polyuria, polydipsia, heat or cold intolerance HEME/LYMPH: Denies excessive bruising, petechia, enlarged lymph nodes, or bleeding problems IMMUNOLOGIC: Denies history of frequent infections, AIDS, or use of immunosuppressive agents   Immunization History  Administered  Date(s) Administered  . Influenza Whole 03/11/2010  . Pneumococcal Conjugate-13 08/24/2017  . Td 07/16/2010  . Zoster Recombinat (Shingrix) 02/27/2018   Pertinent  Health Maintenance Due  Topic Date Due  . FOOT EXAM  Never done  . OPHTHALMOLOGY EXAM  Never done  . URINE MICROALBUMIN  05/17/2011  . PNA vac Low Risk Adult (2 of 2 - PPSV23) 08/25/2018  . MAMMOGRAM  11/16/2019  . INFLUENZA VACCINE  12/17/2019  . HEMOGLOBIN A1C  07/28/2020  . COLONOSCOPY  05/21/2027  . DEXA SCAN  Completed   No flowsheet data found.   Vitals:   02/14/20 1532  BP: 126/79  Pulse: 69  Resp: 18  Temp: 98 F (36.7 C)  TempSrc: Oral  Weight: 148 lb 6.4 oz (67.3 kg)  Height: 5\' 4"  (1.626 m)   Body mass index is 25.47 kg/m.  Physical Exam  GENERAL APPEARANCE: Well nourished. In no acute distress. Normal body habitus SKIN:  Skin is warm and dry. There are no suspicious lesions or rash HEAD: Normal in size and contour. No evidence of trauma EYES: Lids open and close normally. No blepharitis, entropion or ectropion. PERRL. Conjunctivae are clear and sclerae are white. Lenses are without opacity EARS: Pinnae are normal. Patient hears normal voice tunes of the examiner MOUTH and THROAT: Lips are without lesions. Oral mucosa is moist and without lesions. Tongue is normal in shape, size, and color and without lesions NECK: supple, trachea midline, no neck masses, no thyroid tenderness, no thyromegaly LYMPHATICS: No LAN in the neck, no supraclavicular LAN RESPIRATORY: Breathing is even & unlabored, BS CTAB CARDIAC: RRR, no murmur,no extra heart sounds, no edema GI: Abdomen soft, normal BS, no masses, no tenderness, no hepatomegaly, no splenomegaly MUSCULOSKELETAL: No deformities. Movement at each extremity is full and painless. Strength is 5/5 at each extremity. Back is without kyphosis or scoliosis CIRCULATION: Pedal pulses are 2+. There is no edema of the legs, ankles and feet NEUROLOGICAL: There is no  tremor. Speech is clear PSYCHIATRIC: Alert and oriented X 3. Affect and behavior are appropriate  Labs reviewed: Recent Labs    11/08/19 0344 11/09/19 0337 01/21/20 0315 01/21/20 0315 01/22/20 0410 01/22/20 0410 01/24/20 0341 01/25/20 0334 01/26/20 0340  NA 134*   < > 140   < > 139   < > 135 137 137  K 3.4*   < > 3.4*   < > 3.8   < > 3.6 3.3* 3.8  CL 102   < > 110   < > 111   < > 107 109 109  CO2 23   < > 22   < > 23   < > 23 22 23   GLUCOSE 116*   < > 114*   < > 106*   < > 102* 68*  90  BUN 12   < > 10   < > 7*   < > <5* <5* <5*  CREATININE 0.95   < > 0.70   < > 0.60   < > 0.67 0.67 0.70  CALCIUM 8.8*   < > 8.5*   < > 7.9*   < > 7.3* 7.6* 7.6*  MG 1.8   < > 1.2*   < > 1.8  --   --  1.3* 1.7  PHOS 3.1  --  1.8*  --  2.6  --   --   --   --    < > = values in this interval not displayed.   Recent Labs    12/25/19 0237 12/26/19 0540 12/27/19 0430  AST 13* 14* 19  ALT 8 7 10   ALKPHOS 68 63 54  BILITOT 0.5 0.5 0.4  PROT 5.1* 5.4* 5.3*  ALBUMIN 2.6* 2.8* 2.8*   Recent Labs    01/18/20 2032 01/19/20 0330 01/24/20 0341 01/25/20 0334 01/26/20 0340  WBC 6.7   < > 4.9 4.9 5.3  NEUTROABS 2.9  --   --  2.2 2.4  HGB 12.4   < > 9.0* 9.4* 10.0*  HCT 37.4   < > 26.4* 28.7* 30.1*  MCV 84.4   < > 84.9 85.4 84.1  PLT 240   < > 196 225 265   < > = values in this interval not displayed.   Lab Results  Component Value Date   TSH 0.702 01/19/2020   Lab Results  Component Value Date   HGBA1C 4.6 01/29/2020   Lab Results  Component Value Date   CHOL 189 03/25/2010   HDL 39 (L) 03/25/2010   LDLCALC 118 (H) 03/25/2010   LDLDIRECT 44 11/22/2019   TRIG 159 (H) 03/25/2010   CHOLHDL 4.8 Ratio 03/25/2010    Significant Diagnostic Results in last 30 days:  CT ABDOMEN PELVIS WO CONTRAST  Result Date: 01/19/2020 CLINICAL DATA:  Generalized abdominal pain. EXAM: CT ABDOMEN AND PELVIS WITHOUT CONTRAST TECHNIQUE: Multidetector CT imaging of the abdomen and pelvis was performed  following the standard protocol without IV contrast. COMPARISON:  December 23, 2019. FINDINGS: Lower chest: Small bilateral pleural effusions are noted. Right posterior basilar atelectasis or infiltrate is noted. Hepatobiliary: No focal liver abnormality is seen. Status post cholecystectomy. No biliary dilatation. Pancreas: Unremarkable. No pancreatic ductal dilatation or surrounding inflammatory changes. Spleen: Normal in size without focal abnormality. Adrenals/Urinary Tract: Adrenal glands are unremarkable. Kidneys are normal, without renal calculi, focal lesion, or hydronephrosis. Bladder is unremarkable. Stomach/Bowel: The stomach appears normal. There is no evidence of bowel obstruction. The appendix is not visualized. Sigmoid diverticulosis is noted. There is large amount of stool seen in the distal sigmoid colon and rectum concerning for impaction. Mild wall thickening of the rectum is noted with surrounding inflammatory changes concerning for proctitis. Vascular/Lymphatic: Aortic atherosclerosis. No enlarged abdominal or pelvic lymph nodes. Reproductive: Uterus and bilateral adnexa are unremarkable. Other: No abdominal wall hernia or abnormality. No abdominopelvic ascites. Musculoskeletal: No acute or significant osseous findings. IMPRESSION: 1. Small bilateral pleural effusions are noted with right posterior basilar atelectasis or infiltrate. 2. Sigmoid diverticulosis without inflammation. 3. Large amount of stool seen in the distal sigmoid colon and rectum concerning for impaction. Mild wall thickening of the rectum is noted with surrounding inflammatory changes concerning for proctitis. 4. Aortic atherosclerosis. Aortic Atherosclerosis (ICD10-I70.0). Electronically Signed   By: Marijo Conception M.D.   On: 01/19/2020 12:02  DG Abd 1 View  Result Date: 01/21/2020 CLINICAL DATA:  Fecal impaction EXAM: ABDOMEN - 1 VIEW COMPARISON:  CT abdomen/pelvis dated 01/19/2020 FINDINGS: Nonobstructive bowel gas  pattern. Normal colonic stool burden. However, there is moderate stool in the rectal vault, compatible with the clinical history of fecal impaction. Cholecystectomy clips. Degenerative changes of the lumbar spine. IMPRESSION: Moderate stool in the rectal vault, compatible with the clinical history of fecal impaction. Electronically Signed   By: Julian Hy M.D.   On: 01/21/2020 04:39   CT HEAD WO CONTRAST  Result Date: 01/21/2020 CLINICAL DATA:  Altered mental status. EXAM: CT HEAD WITHOUT CONTRAST TECHNIQUE: Contiguous axial images were obtained from the base of the skull through the vertex without intravenous contrast. COMPARISON:  January 18, 2020. FINDINGS: Brain: Mild diffuse cortical atrophy is noted. Mild chronic ischemic white matter disease is noted. No mass effect or midline shift is noted. Ventricular size is within normal limits. There is no evidence of mass lesion, hemorrhage or acute infarction. Vascular: No hyperdense vessel or unexpected calcification. Skull: Normal. Negative for fracture or focal lesion. Sinuses/Orbits: No acute finding. Other: None. IMPRESSION: Mild diffuse cortical atrophy. Mild chronic ischemic white matter disease. No acute intracranial abnormality seen. Electronically Signed   By: Marijo Conception M.D.   On: 01/21/2020 09:58   CT Head Wo Contrast  Result Date: 01/18/2020 CLINICAL DATA:  Mental status change EXAM: CT HEAD WITHOUT CONTRAST TECHNIQUE: Contiguous axial images were obtained from the base of the skull through the vertex without intravenous contrast. COMPARISON:  None. FINDINGS: Brain: No evidence of acute territorial infarction, hemorrhage, hydrocephalus,extra-axial collection or mass lesion/mass effect. There is low-attenuation changes in the deep white matter consistent with small vessel ischemia. Ventricles are normal in size and contour. Vascular: No hyperdense vessel or unexpected calcification. Skull: The skull is intact. No fracture or focal lesion  identified. Sinuses/Orbits: Small amount of fluid seen within the left maxillary sinus and right sphenoid sinus. The orbits and globes intact. Other: None IMPRESSION: No acute intracranial abnormality. Findings consistent chronic small vessel ischemia Electronically Signed   By: Prudencio Pair M.D.   On: 01/18/2020 22:13   MR BRAIN WO CONTRAST  Result Date: 01/20/2020 CLINICAL DATA:  Mental status change with unknown cause. Multiple recent falls. EXAM: MRI HEAD WITHOUT CONTRAST TECHNIQUE: Multiplanar, multiecho pulse sequences of the brain and surrounding structures were obtained without intravenous contrast. COMPARISON:  This head CT from 2 days ago FINDINGS: Brain: Trace subdural hemorrhage along the left cerebral convexity, mainly inferiorly, only 2 mm in maximal thickness. A trace right occipital subdural collection could also be present. Head CT follow-up in 24 hours could be considered. No acute infarct, hydrocephalus, or masslike finding. Moderate chronic small vessel ischemia in the periventricular white matter and pons. Vascular: Normal flow voids. Skull and upper cervical spine: Normal marrow signal Sinuses/Orbits: No acute finding.  Bilateral cataract resection Other: These results were called by telephone at the time of interpretation on 01/20/2020 at 12:45 pm to provider [facility] , who verbally acknowledged these results. IMPRESSION: 1. Thin subdural hematoma along the left cerebral convexity (only 2 mm in thickness). This was not seen on head CT from 2 days ago but may have been too thin to detect by CT. No mass effect. 2. Probable even thinner subdural hematoma along the right occipital convexity. 3. No acute infarct. 4. Moderate motion artifact. Electronically Signed   By: Monte Fantasia M.D.   On: 01/20/2020 12:53   DG Chest  Port 1 View  Result Date: 01/18/2020 CLINICAL DATA:  Altered mental status, status post fall. EXAM: PORTABLE CHEST 1 VIEW COMPARISON:  December 23, 2019 FINDINGS: Decreased  lung volumes are seen which is likely, in part, secondary to the degree of patient inspiration. There is no evidence of acute infiltrate, pleural effusion or pneumothorax. Numerous radiopaque overlying cardiac lead wires are present. The heart size and mediastinal contours are within normal limits. There is marked severity calcification of the aortic arch. No acute osseous abnormalities are identified. IMPRESSION: Low lung volumes without evidence of acute or active cardiopulmonary disease. Electronically Signed   By: Virgina Norfolk M.D.   On: 01/18/2020 22:18   EEG adult  Result Date: 01/22/2020 Lora Havens, MD     01/22/2020  3:59 PM Patient Name: WILLETTE MUDRY MRN: [id number] Epilepsy Attending: Lora Havens Referring Physician/Provider: Dr Donnetta Simpers Date: 01/22/2020 Duration: 24.45 mins Patient history: 74 y.o. female with PMH significant for diabetes mellitus, CHF, chronic anemia, hyperlipidemia, hypertension  who presents with AMS and multiple falls. CTH showed no acute intracranial abnormality. MR Brain showed trace subdural hemorrhage along the left cerebral convexity, mainly inferiorly, only 2 mm in maximal thickness. A trace right occipital subdural collection could also be present. EEG to evaluate for seizure.  Level of alertness: Awake AEDs during EEG study: LEV Technical aspects: This EEG study was done with scalp electrodes positioned according to the 10-20 International system of electrode placement. Electrical activity was acquired at a sampling rate of 500Hz  and reviewed with a high frequency filter of 70Hz  and a low frequency filter of 1Hz . EEG data were recorded continuously and digitally stored. Description: No posterior dominant rhythm was seen. EEG showed continuous generalized 3 to 6 Hz theta-delta slowing. 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 Hyperventilation and photic stimulation were not performed.   ABNORMALITY -Continuous  slow, generalized -Periodic epileptiform discharges with triphasic morphology , generalized IMPRESSION: This study is suggestive of moderate to severe diffuse encephalopathy, nonspecific etiology but likely related to toxic-metabolic etiology. 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  No seizures were seen throughout the recording. [name]   DG HIPS BILAT WITH PELVIS 3-4 VIEWS  Result Date: 01/18/2020 CLINICAL DATA:  Status post fall. EXAM: DG HIP (WITH OR WITHOUT PELVIS) 3-4V BILAT COMPARISON:  None. FINDINGS: There is no evidence of hip fracture or dislocation. There is no evidence of arthropathy or other focal bone abnormality. IMPRESSION: Negative. Electronically Signed   By: Virgina Norfolk M.D.   On: 01/18/2020 22:16    Assessment/Plan ***   Family/ staff Communication: ***  Labs/tests ordered:  ***  DME:  ***  Goals of care:   Discharge.   Durenda Age, DNP, MSN, FNP-BC [facility] [phone] (Monday-Friday 8:00 a.m. - 5:00 p.m.) [phone] (after hours)

## 2020-02-15 ENCOUNTER — Emergency Department (HOSPITAL_COMMUNITY): Payer: Medicare Other

## 2020-02-15 ENCOUNTER — Encounter (HOSPITAL_COMMUNITY): Payer: Self-pay

## 2020-02-15 ENCOUNTER — Non-Acute Institutional Stay (SKILLED_NURSING_FACILITY): Payer: Medicare Other | Admitting: Internal Medicine

## 2020-02-15 ENCOUNTER — Encounter: Payer: Self-pay | Admitting: Internal Medicine

## 2020-02-15 ENCOUNTER — Inpatient Hospital Stay (HOSPITAL_COMMUNITY)
Admission: EM | Admit: 2020-02-15 | Discharge: 2020-02-18 | DRG: 698 | Disposition: A | Discharge status: Home Health | Payer: Medicare Other | Source: Skilled Nursing Facility | Attending: Student | Admitting: Student

## 2020-02-15 DIAGNOSIS — N1831 Chronic kidney disease, stage 3a: Secondary | ICD-10-CM | POA: Diagnosis present

## 2020-02-15 DIAGNOSIS — I10 Essential (primary) hypertension: Secondary | ICD-10-CM

## 2020-02-15 DIAGNOSIS — R5381 Other malaise: Secondary | ICD-10-CM | POA: Diagnosis not present

## 2020-02-15 DIAGNOSIS — Z20822 Contact with and (suspected) exposure to covid-19: Secondary | ICD-10-CM | POA: Diagnosis present

## 2020-02-15 DIAGNOSIS — E1122 Type 2 diabetes mellitus with diabetic chronic kidney disease: Secondary | ICD-10-CM | POA: Diagnosis present

## 2020-02-15 DIAGNOSIS — F339 Major depressive disorder, recurrent, unspecified: Secondary | ICD-10-CM

## 2020-02-15 DIAGNOSIS — E876 Hypokalemia: Secondary | ICD-10-CM | POA: Diagnosis present

## 2020-02-15 DIAGNOSIS — I130 Hypertensive heart and chronic kidney disease with heart failure and stage 1 through stage 4 chronic kidney disease, or unspecified chronic kidney disease: Secondary | ICD-10-CM | POA: Diagnosis present

## 2020-02-15 DIAGNOSIS — Z888 Allergy status to other drugs, medicaments and biological substances status: Secondary | ICD-10-CM

## 2020-02-15 DIAGNOSIS — E872 Acidosis: Secondary | ICD-10-CM | POA: Diagnosis present

## 2020-02-15 DIAGNOSIS — S41111A Laceration without foreign body of right upper arm, initial encounter: Secondary | ICD-10-CM | POA: Diagnosis present

## 2020-02-15 DIAGNOSIS — Z66 Do not resuscitate: Secondary | ICD-10-CM | POA: Diagnosis present

## 2020-02-15 DIAGNOSIS — N390 Urinary tract infection, site not specified: Secondary | ICD-10-CM | POA: Diagnosis present

## 2020-02-15 DIAGNOSIS — D500 Iron deficiency anemia secondary to blood loss (chronic): Secondary | ICD-10-CM | POA: Diagnosis present

## 2020-02-15 DIAGNOSIS — Z79899 Other long term (current) drug therapy: Secondary | ICD-10-CM

## 2020-02-15 DIAGNOSIS — Y846 Urinary catheterization as the cause of abnormal reaction of the patient, or of later complication, without mention of misadventure at the time of the procedure: Secondary | ICD-10-CM | POA: Diagnosis present

## 2020-02-15 DIAGNOSIS — E870 Hyperosmolality and hypernatremia: Secondary | ICD-10-CM | POA: Diagnosis present

## 2020-02-15 DIAGNOSIS — R1310 Dysphagia, unspecified: Secondary | ICD-10-CM | POA: Diagnosis present

## 2020-02-15 DIAGNOSIS — G9341 Metabolic encephalopathy: Secondary | ICD-10-CM | POA: Diagnosis present

## 2020-02-15 DIAGNOSIS — R6520 Severe sepsis without septic shock: Secondary | ICD-10-CM | POA: Diagnosis present

## 2020-02-15 DIAGNOSIS — Z9049 Acquired absence of other specified parts of digestive tract: Secondary | ICD-10-CM

## 2020-02-15 DIAGNOSIS — S0990XA Unspecified injury of head, initial encounter: Secondary | ICD-10-CM

## 2020-02-15 DIAGNOSIS — W19XXXA Unspecified fall, initial encounter: Secondary | ICD-10-CM

## 2020-02-15 DIAGNOSIS — E7800 Pure hypercholesterolemia, unspecified: Secondary | ICD-10-CM

## 2020-02-15 DIAGNOSIS — R000 Tachycardia, unspecified: Secondary | ICD-10-CM

## 2020-02-15 DIAGNOSIS — K219 Gastro-esophageal reflux disease without esophagitis: Secondary | ICD-10-CM | POA: Diagnosis present

## 2020-02-15 DIAGNOSIS — N170 Acute kidney failure with tubular necrosis: Secondary | ICD-10-CM | POA: Diagnosis present

## 2020-02-15 DIAGNOSIS — W06XXXA Fall from bed, initial encounter: Secondary | ICD-10-CM | POA: Diagnosis present

## 2020-02-15 DIAGNOSIS — A419 Sepsis, unspecified organism: Secondary | ICD-10-CM | POA: Diagnosis not present

## 2020-02-15 DIAGNOSIS — D696 Thrombocytopenia, unspecified: Secondary | ICD-10-CM | POA: Diagnosis present

## 2020-02-15 DIAGNOSIS — R7989 Other specified abnormal findings of blood chemistry: Secondary | ICD-10-CM | POA: Diagnosis present

## 2020-02-15 DIAGNOSIS — I2510 Atherosclerotic heart disease of native coronary artery without angina pectoris: Secondary | ICD-10-CM | POA: Diagnosis present

## 2020-02-15 DIAGNOSIS — Y92129 Unspecified place in nursing home as the place of occurrence of the external cause: Secondary | ICD-10-CM | POA: Diagnosis not present

## 2020-02-15 DIAGNOSIS — R4189 Other symptoms and signs involving cognitive functions and awareness: Secondary | ICD-10-CM

## 2020-02-15 DIAGNOSIS — E860 Dehydration: Secondary | ICD-10-CM | POA: Diagnosis present

## 2020-02-15 DIAGNOSIS — Z8719 Personal history of other diseases of the digestive system: Secondary | ICD-10-CM | POA: Diagnosis not present

## 2020-02-15 DIAGNOSIS — R1319 Other dysphagia: Secondary | ICD-10-CM | POA: Diagnosis not present

## 2020-02-15 DIAGNOSIS — A4159 Other Gram-negative sepsis: Secondary | ICD-10-CM | POA: Diagnosis present

## 2020-02-15 DIAGNOSIS — Z8673 Personal history of transient ischemic attack (TIA), and cerebral infarction without residual deficits: Secondary | ICD-10-CM

## 2020-02-15 DIAGNOSIS — M069 Rheumatoid arthritis, unspecified: Secondary | ICD-10-CM | POA: Diagnosis present

## 2020-02-15 DIAGNOSIS — Z8249 Family history of ischemic heart disease and other diseases of the circulatory system: Secondary | ICD-10-CM

## 2020-02-15 DIAGNOSIS — E861 Hypovolemia: Secondary | ICD-10-CM | POA: Diagnosis present

## 2020-02-15 DIAGNOSIS — T83511A Infection and inflammatory reaction due to indwelling urethral catheter, initial encounter: Secondary | ICD-10-CM | POA: Diagnosis not present

## 2020-02-15 DIAGNOSIS — R29818 Other symptoms and signs involving the nervous system: Secondary | ICD-10-CM | POA: Diagnosis not present

## 2020-02-15 DIAGNOSIS — T148XXA Other injury of unspecified body region, initial encounter: Secondary | ICD-10-CM

## 2020-02-15 DIAGNOSIS — E11649 Type 2 diabetes mellitus with hypoglycemia without coma: Secondary | ICD-10-CM | POA: Diagnosis not present

## 2020-02-15 DIAGNOSIS — I5042 Chronic combined systolic (congestive) and diastolic (congestive) heart failure: Secondary | ICD-10-CM | POA: Diagnosis present

## 2020-02-15 DIAGNOSIS — R9431 Abnormal electrocardiogram [ECG] [EKG]: Secondary | ICD-10-CM | POA: Diagnosis not present

## 2020-02-15 DIAGNOSIS — G2581 Restless legs syndrome: Secondary | ICD-10-CM

## 2020-02-15 DIAGNOSIS — E039 Hypothyroidism, unspecified: Secondary | ICD-10-CM | POA: Diagnosis present

## 2020-02-15 DIAGNOSIS — M129 Arthropathy, unspecified: Secondary | ICD-10-CM

## 2020-02-15 DIAGNOSIS — S0003XA Contusion of scalp, initial encounter: Secondary | ICD-10-CM | POA: Diagnosis present

## 2020-02-15 DIAGNOSIS — E1140 Type 2 diabetes mellitus with diabetic neuropathy, unspecified: Secondary | ICD-10-CM | POA: Diagnosis present

## 2020-02-15 DIAGNOSIS — Z7189 Other specified counseling: Secondary | ICD-10-CM | POA: Diagnosis not present

## 2020-02-15 DIAGNOSIS — B9689 Other specified bacterial agents as the cause of diseases classified elsewhere: Secondary | ICD-10-CM

## 2020-02-15 DIAGNOSIS — N1832 Chronic kidney disease, stage 3b: Secondary | ICD-10-CM

## 2020-02-15 DIAGNOSIS — N179 Acute kidney failure, unspecified: Secondary | ICD-10-CM | POA: Diagnosis present

## 2020-02-15 DIAGNOSIS — K2211 Ulcer of esophagus with bleeding: Secondary | ICD-10-CM

## 2020-02-15 DIAGNOSIS — K222 Esophageal obstruction: Secondary | ICD-10-CM | POA: Diagnosis present

## 2020-02-15 LAB — BASIC METABOLIC PANEL
Anion gap: 15 (ref 5–15)
BUN: 37 mg/dL — ABNORMAL HIGH (ref 8–23)
CO2: 23 mmol/L (ref 22–32)
Calcium: 9.1 mg/dL (ref 8.9–10.3)
Chloride: 106 mmol/L (ref 98–111)
Creatinine, Ser: 2.53 mg/dL — ABNORMAL HIGH (ref 0.44–1.00)
GFR calc Af Amer: 21 mL/min — ABNORMAL LOW (ref >60)
GFR calc non Af Amer: 18 mL/min — ABNORMAL LOW (ref >60)
Glucose, Bld: 72 mg/dL (ref 70–99)
Potassium: 3.2 mmol/L — ABNORMAL LOW (ref 3.5–5.1)
Sodium: 144 mmol/L (ref 135–145)

## 2020-02-15 LAB — CBC WITH DIFFERENTIAL/PLATELET
Abs Immature Granulocytes: 0.02 10*3/uL (ref 0.00–0.07)
Basophils Absolute: 0 10*3/uL (ref 0.0–0.1)
Basophils Relative: 0 %
Eosinophils Absolute: 0.2 10*3/uL (ref 0.0–0.5)
Eosinophils Relative: 3 %
HCT: 31.6 % — ABNORMAL LOW (ref 36.0–46.0)
Hemoglobin: 10.5 g/dL — ABNORMAL LOW (ref 12.0–15.0)
Immature Granulocytes: 0 %
Lymphocytes Relative: 42 %
Lymphs Abs: 3 10*3/uL (ref 0.7–4.0)
MCH: 28 pg (ref 26.0–34.0)
MCHC: 33.2 g/dL (ref 30.0–36.0)
MCV: 84.3 fL (ref 80.0–100.0)
Monocytes Absolute: 0.6 10*3/uL (ref 0.1–1.0)
Monocytes Relative: 9 %
Neutro Abs: 3.2 10*3/uL (ref 1.7–7.7)
Neutrophils Relative %: 46 %
Platelets: 209 10*3/uL (ref 150–400)
RBC: 3.75 MIL/uL — ABNORMAL LOW (ref 3.87–5.11)
RDW: 16.7 % — ABNORMAL HIGH (ref 11.5–15.5)
WBC: 7 10*3/uL (ref 4.0–10.5)
nRBC: 0 % (ref 0.0–0.2)

## 2020-02-15 LAB — URINALYSIS, ROUTINE W REFLEX MICROSCOPIC
Bilirubin Urine: NEGATIVE
Glucose, UA: NEGATIVE mg/dL
Ketones, ur: 5 mg/dL — AB
Nitrite: NEGATIVE
Protein, ur: 30 mg/dL — AB
Specific Gravity, Urine: 1.015 (ref 1.005–1.030)
WBC, UA: >50 WBC/hpf — ABNORMAL HIGH (ref 0–5)
pH: 5 (ref 5.0–8.0)

## 2020-02-15 LAB — HEPATIC FUNCTION PANEL
ALT: 12 U/L (ref 0–44)
AST: 28 U/L (ref 15–41)
Albumin: 3.1 g/dL — ABNORMAL LOW (ref 3.5–5.0)
Alkaline Phosphatase: 61 U/L (ref 38–126)
Bilirubin, Direct: 0.2 mg/dL (ref 0.0–0.2)
Indirect Bilirubin: 0.9 mg/dL (ref 0.3–0.9)
Total Bilirubin: 1.1 mg/dL (ref 0.3–1.2)
Total Protein: 6.5 g/dL (ref 6.5–8.1)

## 2020-02-15 LAB — RESPIRATORY PANEL BY RT PCR (FLU A&B, COVID)
Influenza A by PCR: NEGATIVE
Influenza B by PCR: NEGATIVE
SARS Coronavirus 2 by RT PCR: NEGATIVE

## 2020-02-15 LAB — URIC ACID: Uric Acid, Serum: 15.6 mg/dL — ABNORMAL HIGH (ref 2.5–7.1)

## 2020-02-15 LAB — CK: Total CK: 40 U/L (ref 38–234)

## 2020-02-15 LAB — CBG MONITORING, ED: Glucose-Capillary: 71 mg/dL (ref 70–99)

## 2020-02-15 MED ORDER — SENNOSIDES-DOCUSATE SODIUM 8.6-50 MG PO TABS
1.0000 | ORAL_TABLET | Freq: Every day | ORAL | Status: DC
  Administered 2020-02-15 – 2020-02-17 (×3): 1 via ORAL
  Filled 2020-02-15 (×3): qty 1

## 2020-02-15 MED ORDER — FOLIC ACID 1 MG PO TABS
1.0000 mg | ORAL_TABLET | Freq: Every day | ORAL | Status: DC
  Administered 2020-02-16 – 2020-02-18 (×3): 1 mg via ORAL
  Filled 2020-02-15 (×3): qty 1

## 2020-02-15 MED ORDER — SACCHAROMYCES BOULARDII 250 MG PO CAPS
250.0000 mg | ORAL_CAPSULE | Freq: Every day | ORAL | Status: DC
  Administered 2020-02-16 – 2020-02-18 (×3): 250 mg via ORAL
  Filled 2020-02-15 (×4): qty 1

## 2020-02-15 MED ORDER — HEPARIN SODIUM (PORCINE) 5000 UNIT/ML IJ SOLN
5000.0000 [IU] | Freq: Two times a day (BID) | INTRAMUSCULAR | Status: DC
  Administered 2020-02-15 – 2020-02-18 (×6): 5000 [IU] via SUBCUTANEOUS
  Filled 2020-02-15 (×6): qty 1

## 2020-02-15 MED ORDER — FENTANYL CITRATE (PF) 100 MCG/2ML IJ SOLN
25.0000 ug | Freq: Once | INTRAMUSCULAR | Status: AC
  Administered 2020-02-15: 25 ug via INTRAVENOUS
  Filled 2020-02-15: qty 2

## 2020-02-15 MED ORDER — SODIUM CHLORIDE 0.9 % IV BOLUS
1000.0000 mL | Freq: Once | INTRAVENOUS | Status: AC
Start: 2020-02-15 — End: 2020-02-15
  Administered 2020-02-15: 1000 mL via INTRAVENOUS

## 2020-02-15 MED ORDER — ACETAMINOPHEN 325 MG PO TABS: 650.0000 mg | ORAL_TABLET | Freq: Four times a day (QID) | ORAL | Status: DC | PRN

## 2020-02-15 MED ORDER — ATORVASTATIN CALCIUM 20 MG PO TABS
20.0000 mg | ORAL_TABLET | Freq: Every day | ORAL | Status: DC
  Administered 2020-02-16 – 2020-02-18 (×3): 20 mg via ORAL
  Filled 2020-02-15 (×3): qty 1

## 2020-02-15 MED ORDER — PANTOPRAZOLE SODIUM 40 MG PO TBEC
40.0000 mg | DELAYED_RELEASE_TABLET | Freq: Two times a day (BID) | ORAL | Status: DC
  Administered 2020-02-15 – 2020-02-18 (×6): 40 mg via ORAL
  Filled 2020-02-15 (×6): qty 1

## 2020-02-15 MED ORDER — INSULIN ASPART 100 UNIT/ML ~~LOC~~ SOLN
0.0000 [IU] | Freq: Three times a day (TID) | SUBCUTANEOUS | Status: DC
  Filled 2020-02-15: qty 0.09

## 2020-02-15 MED ORDER — POTASSIUM CHLORIDE CRYS ER 20 MEQ PO TBCR
40.0000 meq | EXTENDED_RELEASE_TABLET | Freq: Once | ORAL | Status: AC
  Administered 2020-02-15: 40 meq via ORAL
  Filled 2020-02-15: qty 2

## 2020-02-15 MED ORDER — ACETAMINOPHEN 650 MG RE SUPP: 650.0000 mg | Freq: Four times a day (QID) | RECTAL | Status: DC | PRN

## 2020-02-15 MED ORDER — SUCRALFATE 1 G PO TABS
1.0000 g | ORAL_TABLET | Freq: Three times a day (TID) | ORAL | Status: DC
  Administered 2020-02-15 – 2020-02-18 (×11): 1 g via ORAL
  Filled 2020-02-15 (×10): qty 1

## 2020-02-15 MED ORDER — SODIUM CHLORIDE 0.9 % IV SOLN: INTRAVENOUS | Status: DC

## 2020-02-15 MED ORDER — METOPROLOL TARTRATE 25 MG PO TABS
25.0000 mg | ORAL_TABLET | Freq: Two times a day (BID) | ORAL | Status: DC
  Administered 2020-02-15 – 2020-02-18 (×6): 25 mg via ORAL
  Filled 2020-02-15 (×6): qty 1

## 2020-02-15 MED ORDER — LORAZEPAM 2 MG/ML IJ SOLN
INTRAMUSCULAR | Status: AC
  Administered 2020-02-15: 0.5 mg via INTRAVENOUS
  Filled 2020-02-15: qty 1

## 2020-02-15 MED ORDER — ACETAMINOPHEN 325 MG PO TABS
650.0000 mg | ORAL_TABLET | Freq: Four times a day (QID) | ORAL | Status: DC | PRN
  Administered 2020-02-16 (×3): 650 mg via ORAL
  Filled 2020-02-15 (×3): qty 2

## 2020-02-15 MED ORDER — SODIUM CHLORIDE 0.9 % IV SOLN
1.0000 g | INTRAVENOUS | Status: AC
  Administered 2020-02-15 – 2020-02-16 (×2): 1 g via INTRAVENOUS
  Filled 2020-02-15: qty 1
  Filled 2020-02-15: qty 10

## 2020-02-15 MED ORDER — POLYETHYLENE GLYCOL 3350 17 G PO PACK
17.0000 g | PACK | Freq: Every day | ORAL | Status: DC | PRN
  Filled 2020-02-15: qty 1

## 2020-02-15 MED ORDER — ESCITALOPRAM OXALATE 10 MG PO TABS
10.0000 mg | ORAL_TABLET | Freq: Every day | ORAL | Status: DC
  Administered 2020-02-16 – 2020-02-18 (×3): 10 mg via ORAL
  Filled 2020-02-15 (×3): qty 1

## 2020-02-15 MED ORDER — ENSURE ENLIVE PO LIQD: Freq: Every day | ORAL | Status: DC

## 2020-02-15 MED ORDER — SODIUM CHLORIDE 0.9 % IV BOLUS
250.0000 mL | Freq: Once | INTRAVENOUS | Status: AC
  Administered 2020-02-15: 250 mL via INTRAVENOUS

## 2020-02-15 MED ORDER — LORAZEPAM 2 MG/ML IJ SOLN: 0.5000 mg | Freq: Once | INTRAMUSCULAR | Status: AC

## 2020-02-15 MED ORDER — SODIUM CHLORIDE 0.9 % IV BOLUS: 500.0000 mL | Freq: Once | INTRAVENOUS | Status: DC

## 2020-02-15 MED ORDER — ONDANSETRON HCL 4 MG PO TABS: 4.0000 mg | ORAL_TABLET | Freq: Three times a day (TID) | ORAL | Status: DC | PRN

## 2020-02-15 NOTE — Assessment & Plan Note (Addendum)
Klebsiella pneumoniae universally sensitive except to ampicillin and nitrofurantoin.  Levofloxacin initiated 9/25 x 5 days. CT of abdomen/pelvis 01/19/2020 had revealed diverticulosis without diverticulitis; questionable impaction; possible proctitis; and aortic atherosclerosis.  In the context of the UTI and the subjective back pain 9/29; renal calculi should be considered in the differential diagnosis but UA did not reveal hematuria.

## 2020-02-15 NOTE — Assessment & Plan Note (Signed)
Anemia improving: 02/14/2020 H/H 11.9/35.6 up from values of 11.4/34.3 on 9/22

## 2020-02-15 NOTE — Assessment & Plan Note (Addendum)
02/07/2020 creatinine 1.30, BUN 25.9, GFR 40.84 indicating CKD stage IIIb 9/30 creatinine 2.65, BUN 35.9, and GFR 17.26 indicating AKI with CKD Stage 4 Staff unable to start IV at [facility]; non emergency crew started IV the evening of 9/29 but patient pulled out IV when she rolled out of bed the morning of 9/30. Up to Date reviewed there are no definitive recommendations concerning Plaquenil and Requip with advanced CKD.  These will be held until renal function improves.

## 2020-02-15 NOTE — Progress Notes (Signed)
This encounter was created in error - please disregard.

## 2020-02-15 NOTE — Assessment & Plan Note (Addendum)
02/14/2020 crying out complaining of low back pain.  Portable imaging revealed only degenerative changes.  Negative straight leg raising on exam. 9/30 apparently rolled out of bed striking right forehead.  Boss and faint hematoma present.  Patient gave the date as November, 1939.  Daughter reports history of multiple falls prior to admission.  CNS reimaging clinically indicated.

## 2020-02-15 NOTE — Progress Notes (Signed)
NURSING HOME LOCATION:  [facility] ROOM NUMBER:    CODE STATUS:    PCP:  [name] NP  This is a nursing facility follow up for specific acute issue of possible back pain and altered mental status.  Interim medical record and care since last [facility] visit was updated with review of diagnostic studies and change in clinical status since last visit were documented.  HPI: The patient was to have been discharged today; however, staff reports the patient was screaming out last evening stating that she was having back pain.  Also this morning she apparently rolled off the bed striking her right forehead.  There was no loss of consciousness.See pertinent cns history below. Portable imaging of the lumbosacral spine revealed minimal degenerative changes with no acute fracture.  CBC revealed a mild anemia with hemoglobin 11.9 and hematocrit 35.6, up from values of 11.4/34.3 on 9/22.  There was minimal increase in lymphocytes but otherwise differential was normal and total white count was 9300.   Significantly creatinine was 2.65, BUN 35.9, and GFR 17.26 indicating AKI superimposed on CKD.  On 9/22 creatinine was 1.30, BUN 25.9, and GFR 40.84 indicating CKD stage IIIb.  IV fluids could not be started by the staff of the SNF.  IV was initiated last night by the nonemergency crew as the SNF staff could not start an IV.  Unfortunately she pulled the IV out this morning when she rolled out of bed.  She had received approximately 400 cc of IV fluids for the AKI on CKD. Levofloxacin had been initiated 9/25 for Klebsiella pneumonia UTI.  Today will be the last day of therapy.   The patient was admitted to the SNF on 9/10 having been hospitalized 9/2-9/10 with acute metabolic encephalopathy of unclear etiology.  AKI was present @ that time; this resolved with IV fluids.  Exam was nonfocal and unrevealing and labs were nondiagnostic.There were no significant findings on MRI or EEG.  There was a  2 mm small hematoma and possibly 1 small area of SDH along the right occiput.  Repeat CT on 9/5 revealed no hematoma.  Prophylactic Keppra was recommended along with delirium precautions.  Gabapentin, Flexeril, and pain medicines were held as these could be potential factors or causes of the metabolic encephalopathy. Her course was complicated by dysphagia; she has a history of esophageal strictures. Stool was heme positive; there was no active bleeding noted.  Anemia of chronic disease was felt to be present.  At admission 9/2 hemoglobin 12.2/hematocrit 37.4.  The anemia progressed to values of 7.8/24.4 on 9/5. 9/9 esophageal dilation was performed.  Twice daily PPI was initiated indefinitely and sulcalfate was to be administered for 1 month. Anticoagulants and antiplatelet meds were felt to be contraindicated  Review of systems: Dementia invalidated responses. Date given as Thursday, November, 1940.  She denied any back pain at this time. Her daughter who was present and had planned to take her home provides some history.  She states that her mother has been talking about her parents who have been dead for over 3 decades.  The patient does state that she is chilled and shaky.  She has no Covid related symptomatology.  She has no GI or GU symptoms either. Her daughter states that the patient had been falling repeatedly prior to her most recent hospitalization.  She had been residing by herself.  The patient is on hydroxychloroquine; the daughter does not know why or who prescribed it.  Her daughter was  unaware that the patient appears to have significant neurocognitive deficit.  No formal MMSE has been performed beyond BMs here.  Physical exam:  Pertinent or positive findings: The patient is conversant and can follow commands.  She is confused as noted above.  There is a very faint boss/hematoma over the right anterior forehead at the scalp line.  Eyebrows are absent.  Pupils are large but reactive to  light. EOM intact.  When she was asked to count fingers her responses were intermittently incorrect. Teeth are coated.  Tachycardia is present.  Breath sounds are decreased.  Pedal pulses are decreased.  She has bruising over the upper extremities especially the dorsum of the hands.  There is a dressing at the left elbow.  Straight leg raising is negative.  She does have some fusiform changes of the knees.  The hands do not suggest classic rheumatoid arthritis.  General appearance:  no acute distress, increased work of breathing is present.   Lymphatic: No lymphadenopathy about the head, neck, axilla. Eyes: No conjunctival inflammation or lid edema is present. There is no scleral icterus. Ears:  External ear exam shows no significant lesions or deformities.   Nose:  External nasal examination shows no deformity or inflammation. Nasal mucosa are pink and moist without lesions, exudates Oral exam:  There is no oropharyngeal erythema or exudate. Neck:  No thyromegaly, masses, tenderness noted.    Heart:  No murmur, click, rub .  Lungs: without wheezes, rhonchi, rales, rubs. Abdomen: Bowel sounds are normal. Abdomen is soft and nontender with no organomegaly, hernias, masses. GU: Deferred  Extremities:  No cyanosis, clubbing, edema  Neurologic exam : Balance, Rhomberg, finger to nose testing could not be completed due to clinical state Skin: Warm & dry w/o tenting. No significant  rash.  See summary under each active problem in the Problem List with associated updated therapeutic plan

## 2020-02-15 NOTE — ED Provider Notes (Signed)
[facility] DEPT Provider Note   CSN: [id number] Arrival date & time: 02/15/20  1250     History Chief Complaint  Patient presents with  . Fall  . Headache    Chloe Harding is a 74 y.o. female history of diabetes, hypertension, hypercholesterolemia, CHF, CKD, obesity, esophageal strictures.  Patient reports that she was at the nursing home this morning when she rolled out of bed striking the right side of her head on the ground.  She denies loss of consciousness or blood thinner use.  She reports that she has developed a right-sided headache since that time is been constant throbbing moderate intensity no aggravating or alleviating factors or radiation of pain.  She also suffered several small skin tears to her arms but those are nonpainful.  She denies any other pain or injury at this time.  Denies fevers/chills, vomiting, diarrhea or any additional concerns.  Of note patient has indwelling Foley.  HPI     Past Medical History:  Diagnosis Date  . Arthritis   . Depression   . Diabetes mellitus   . Hiatal hernia   . Hypercholesteremia   . Hypertension   . Reflux   . Renal disorder    shutting down 4 years ago    Patient Active Problem List   Diagnosis Date Noted  . Tachycardia 02/15/2020  . Neurocognitive deficits 01/30/2020  . AKI (acute kidney injury) ([place]) 01/19/2020  . Acute encephalopathy 01/18/2020  . Diarrhea 12/23/2019  . SOB (shortness of breath)   . Demand ischemia ([place])   . CAD in native artery   . Pneumonia 11/05/2019  . Hyponatremia 11/05/2019  . Acute CHF (congestive heart failure) ([place]) 11/05/2019  . Esophageal stricture   . Dysphagia 01/15/2019  . Symptomatic anemia 05/18/2017  . Acute-on-chronic kidney injury ([place]) 05/18/2017  . Hypertension 05/18/2017  . Hiatal hernia 05/18/2017  . History of esophageal stricture 05/18/2017  . Pure hypercholesterolemia 05/18/2017  . Diabetes mellitus 05/18/2017  . Solitary  thyroid nodule 05/18/2017  . Hypotension 03/30/2015  . Sepsis ([place]) 03/30/2015  . CAP (community acquired pneumonia) 03/30/2015  . Hyperkalemia 03/30/2015  . Mild renal insufficiency 03/30/2015  . Nausea with vomiting 03/30/2015  . Diabetic neuropathy ([place]) 03/30/2015  . OBESITY 03/11/2010  . Diabetes with neurologic complications ([place]) 54/27/0623  . Dyslipidemia 12/26/2009  . ANEMIA 12/26/2009  . Major depression, recurrent ([place]) 12/26/2009  . HYPERTENSION, BENIGN ESSENTIAL 12/26/2009  . GERD 12/26/2009  . HIATAL HERNIA 12/26/2009  . CHRONIC KIDNEY DISEASE STAGE I 12/26/2009  . UTI due to Klebsiella species 12/26/2009  . Arthropathy 12/26/2009  . ARTHROSCOPY, RIGHT KNEE, HX OF 12/26/2009    Past Surgical History:  Procedure Laterality Date  . BALLOON DILATION N/A 01/25/2020   Procedure: BALLOON DILATION;  Surgeon: Carol Ada, MD;  Location: [facility];  Service: Endoscopy;  Laterality: N/A;  . CHOLECYSTECTOMY    . COLONOSCOPY WITH PROPOFOL N/A 05/20/2017   Procedure: COLONOSCOPY WITH PROPOFOL;  Surgeon: Carol Ada, MD;  Location: [facility];  Service: Endoscopy;  Laterality: N/A;  . ESOPHAGOGASTRODUODENOSCOPY N/A 05/19/2017   Procedure: ESOPHAGOGASTRODUODENOSCOPY (EGD);  Surgeon: Juanita Craver, MD;  Location: [facility] ENDOSCOPY;  Service: Endoscopy;  Laterality: N/A;  . ESOPHAGOGASTRODUODENOSCOPY (EGD) WITH PROPOFOL N/A 01/17/2019   Procedure: ESOPHAGOGASTRODUODENOSCOPY (EGD) WITH PROPOFOL;  Surgeon: Carol Ada, MD;  Location: [facility] ENDOSCOPY;  Service: Endoscopy;  Laterality: N/A;  . ESOPHAGOGASTRODUODENOSCOPY (EGD) WITH PROPOFOL N/A 01/25/2020   Procedure: ESOPHAGOGASTRODUODENOSCOPY (EGD) WITH PROPOFOL;  Surgeon: Carol Ada, MD;  Location: [facility] ENDOSCOPY;  Service: Endoscopy;  Laterality: N/A;  . SAVORY DILATION N/A 01/17/2019   Procedure: SAVORY DILATION;  Surgeon: Carol Ada, MD;  Location: [facility] ENDOSCOPY;  Service: Endoscopy;  Laterality: N/A;     OB History   No obstetric  history on file.     Family History  Problem Relation Age of Onset  . Heart attack Sister   . Heart failure Sister   . Heart attack Brother   . Heart failure Brother     Social History   Tobacco Use  . Smoking status: Never Smoker  . Smokeless tobacco: Never Used  Vaping Use  . Vaping Use: Never used  Substance Use Topics  . Alcohol use: No  . Drug use: No    Home Medications Prior to Admission medications   Medication Sig Start Date End Date Taking? Authorizing Provider  acetaminophen (TYLENOL) 325 MG tablet Take 650 mg by mouth every 6 (six) hours as needed.    [provider]  atorvastatin (LIPITOR) 20 MG tablet Take 1 tablet (20 mg total) by mouth daily. 02/14/20   Medina-Vargas, Monina C, NP  bisacodyl (DULCOLAX) 5 MG EC tablet Take 1 tablet (5 mg total) by mouth daily as needed for moderate constipation. 11/09/19   Amin, Ankit Chirag, MD  CONSTULOSE 10 GM/15ML solution Take 15 mLs (10 g total) by mouth daily as needed for mild constipation. Lactulose 02/14/20   Medina-Vargas, Monina C, NP  escitalopram (LEXAPRO) 10 MG tablet Take 1 tablet (10 mg total) by mouth daily. 02/14/20   Medina-Vargas, Monina C, NP  folic acid (FOLVITE) 1 MG tablet Take 1 tablet (1 mg total) by mouth daily. 02/14/20   Medina-Vargas, Monina C, NP  furosemide (LASIX) 40 MG tablet Take 1 tablet (40 mg total) by mouth daily. 02/14/20   Medina-Vargas, Monina C, NP  glucagon (GLUCAGEN) 1 MG SOLR injection Inject 1 mg into the vein once as needed for low blood sugar.    [provider]  hydroxychloroquine (PLAQUENIL) 200 MG tablet Take 1 tablet (200 mg total) by mouth daily. 02/14/20   Medina-Vargas, Monina C, NP  metoprolol tartrate (LOPRESSOR) 25 MG tablet Take 1 tablet (25 mg total) by mouth 2 (two) times daily. 02/14/20   Medina-Vargas, Monina C, NP  Nutritional Supplement LIQD Take by mouth in the morning, at noon, and at bedtime. MedPass    [provider]  Nutritional Supplements  (NUTRITIONAL SUPPLEMENT PO) Take 1 each by mouth daily. Magic Cup     [provider]  ondansetron (ZOFRAN) 4 MG tablet Take 4 mg by mouth every 8 (eight) hours as needed for nausea.    [provider]  pantoprazole (PROTONIX) 40 MG tablet Take 1 tablet (40 mg total) by mouth 2 (two) times daily. 02/14/20   Medina-Vargas, Monina C, NP  polyethylene glycol (MIRALAX / GLYCOLAX) 17 g packet Take 17 g by mouth daily as needed for moderate constipation or severe constipation. 11/09/19   Amin, Jeanella Flattery, MD  potassium chloride (KLOR-CON) 10 MEQ tablet Take 1 tablet (10 mEq total) by mouth daily. 02/14/20   Medina-Vargas, Monina C, NP  ropinirole (REQUIP) 5 MG tablet Take 1 tablet (5 mg total) by mouth at bedtime. 02/14/20   Medina-Vargas, Monina C, NP  saccharomyces boulardii (FLORASTOR) 250 MG capsule Take 250 mg by mouth daily. 02/10/20 02/17/20  [provider]  senna-docusate (SENOKOT-S) 8.6-50 MG tablet Take 1 tablet by mouth at bedtime. 11/09/19   Damita Lack, MD  sucralfate (CARAFATE) 1 g tablet Take 1 tablet (1 g total) by mouth 4 (four) times daily -  with meals and at bedtime for 14 days. 02/14/20 02/28/20  Medina-Vargas, Monina C, NP  Vitamin D3 (VITAMIN D) 25 MCG tablet Take 1 tablet (1,000 Units total) by mouth daily. 02/14/20   Medina-Vargas, Monina C, NP    Allergies    Amlodipine besylate  Review of Systems   Review of Systems Ten systems are reviewed and are negative for acute change except as noted in the HPI  Physical Exam Updated Vital Signs BP (!) 121/56   Pulse (!) 109   Temp 98.4 F (36.9 C) (Oral) Comment (Src): Simultaneous filing. User may not have seen previous data.  Resp 18   Ht 5\' 4"  (1.626 m)   Wt 67 kg   SpO2 99%   BMI 25.35 kg/m   Physical Exam Constitutional:      General: She is not in acute distress.    Appearance: Normal appearance. She is well-developed. She is not ill-appearing or diaphoretic.  HENT:     Head:  Normocephalic. Contusion present.     Jaw: There is normal jaw occlusion.      Right Ear: External ear normal.     Left Ear: External ear normal.     Nose: Nose normal.     Mouth/Throat:     Comments: No evidence of dental injury Eyes:     General: Vision grossly intact. Gaze aligned appropriately.     Extraocular Movements: Extraocular movements intact.     Pupils: Pupils are equal, round, and reactive to light.  Neck:     Trachea: Trachea and phonation normal. No tracheal tenderness or tracheal deviation.  Cardiovascular:     Rate and Rhythm: Normal rate and regular rhythm.     Pulses:          Dorsalis pedis pulses are 2+ on the right side and 2+ on the left side.  Pulmonary:     Effort: Pulmonary effort is normal. No respiratory distress.     Breath sounds: Normal breath sounds and air entry.  Chest:     Chest wall: No deformity, tenderness or crepitus.  Abdominal:     General: There is no distension. There are no signs of injury.     Palpations: Abdomen is soft.     Tenderness: There is no abdominal tenderness. There is no guarding or rebound.  Musculoskeletal:        General: Normal range of motion.     Cervical back: Normal range of motion and neck supple. No spinous process tenderness or muscular tenderness.     Comments: No midline C/T/L spinal tenderness to palpation, no paraspinal muscle tenderness, no deformity, crepitus, or step-off noted. No sign of injury to the neck or back.  Appropriate range of motion and strength for age at all major joints.  Patient able to bring bilateral knees to chest without pain.  Hips stable to compression bilaterally without pain.  Skin:    General: Skin is warm and dry.     Comments: Multiple small superficial skin tears without repairable lacerations  Neurological:     Mental Status: She is alert.     GCS: GCS eye subscore is 4. GCS verbal subscore is 5. GCS motor subscore is 6.     Comments: Speech is clear and goal oriented, follows  commands Major Cranial nerves without deficit, no facial droop Moves extremities without ataxia, coordination intact  Psychiatric:  Behavior: Behavior normal.     ED Results / Procedures / Treatments   Labs (all labs ordered are listed, but only abnormal results are displayed) Labs Reviewed  CBC WITH DIFFERENTIAL/PLATELET - Abnormal; Notable for the following components:      Result Value   RBC 3.75 (*)    Hemoglobin 10.5 (*)    HCT 31.6 (*)    RDW 16.7 (*)    All other components within normal limits  BASIC METABOLIC PANEL - Abnormal; Notable for the following components:   Potassium 3.2 (*)    BUN 37 (*)    Creatinine, Ser 2.53 (*)    GFR calc non Af Amer 18 (*)    GFR calc Af Amer 21 (*)    All other components within normal limits  URINALYSIS, ROUTINE W REFLEX MICROSCOPIC - Abnormal; Notable for the following components:   APPearance HAZY (*)    Hgb urine dipstick SMALL (*)    Ketones, ur 5 (*)    Protein, ur 30 (*)    Leukocytes,Ua LARGE (*)    WBC, UA >50 (*)    Bacteria, UA RARE (*)    All other components within normal limits  URINE CULTURE  RESPIRATORY PANEL BY RT PCR (FLU A&B, COVID)  HEPATIC FUNCTION PANEL  CK  URIC ACID    EKG None  Radiology DG Pelvis 1-2 Views  Result Date: 02/15/2020 CLINICAL DATA:  Fall EXAM: PELVIS - 1-2 VIEW COMPARISON:  05/18/2017 FINDINGS: There is no evidence of pelvic fracture or diastasis. No pelvic bone lesions are seen. IMPRESSION: Negative. Electronically Signed   By: Franchot Gallo M.D.   On: 02/15/2020 13:36   CT Head Wo Contrast  Result Date: 02/15/2020 CLINICAL DATA:  Head trauma, moderate/severe. Additional provided: Hematoma to right side of head and laceration to right arm, patient reports headache. EXAM: CT HEAD WITHOUT CONTRAST CT CERVICAL SPINE WITHOUT CONTRAST TECHNIQUE: Multidetector CT imaging of the head and cervical spine was performed following the standard protocol without intravenous contrast.  Multiplanar CT image reconstructions of the cervical spine were also generated. COMPARISON:  Head CT 01/21/2020. Brain MRI 01/20/2020. CT cervical spine 12/23/2019. Thyroid ultrasound 01/15/2019 FINDINGS: CT HEAD FINDINGS Brain: Stable, mild generalized cerebral atrophy. Stable, moderate ill-defined hypoattenuation within the cerebral white matter which is nonspecific, but consistent with chronic small vessel ischemic disease. There is no acute intracranial hemorrhage. No demarcated cortical infarct. No extra-axial fluid collection. No evidence of intracranial mass. No midline shift. Vascular: No hyperdense vessel.  Atherosclerotic calcifications. Skull: Normal. Negative for fracture or focal lesion. Sinuses/Orbits: Visualized orbits show no acute finding. Left maxillary sinus air-fluid level. Air-fluid level and frothy secretions within the right sphenoid sinus. No significant mastoid effusion. Other: Right frontoparietal scalp hematoma. CT CERVICAL SPINE FINDINGS Alignment: No significant spondylolisthesis. Skull base and vertebrae: The basion-dental and atlanto-dental intervals are maintained.No evidence of acute fracture to the cervical spine. Soft tissues and spinal canal: No prevertebral fluid or swelling. No visible canal hematoma. Disc levels: No significant bony spinal canal or neural foraminal narrowing at any level. Upper chest: No consolidation within the imaged lung apices. No visible pneumothorax. Other: Redemonstrated 3 cm right thyroid lobe nodule. IMPRESSION: CT head: 1. No evidence of acute intracranial abnormality. 2. Right frontoparietal scalp hematoma. 3. Stable mild generalized cerebral atrophy and moderate chronic small vessel ischemic disease. 4. Left maxillary and right sphenoid sinusitis. CT cervical spine: 1. No evidence of acute fracture to the cervical spine. 2. Redemonstrated 3 cm right thyroid  lobe nodule. This nodule was previously assessed with thyroid ultrasound on 01/15/2019.  Please refer to this prior report for further description and recommendations. Electronically Signed   By: Kellie Simmering DO   On: 02/15/2020 15:22   CT Cervical Spine Wo Contrast  Result Date: 02/15/2020 CLINICAL DATA:  Head trauma, moderate/severe. Additional provided: Hematoma to right side of head and laceration to right arm, patient reports headache. EXAM: CT HEAD WITHOUT CONTRAST CT CERVICAL SPINE WITHOUT CONTRAST TECHNIQUE: Multidetector CT imaging of the head and cervical spine was performed following the standard protocol without intravenous contrast. Multiplanar CT image reconstructions of the cervical spine were also generated. COMPARISON:  Head CT 01/21/2020. Brain MRI 01/20/2020. CT cervical spine 12/23/2019. Thyroid ultrasound 01/15/2019 FINDINGS: CT HEAD FINDINGS Brain: Stable, mild generalized cerebral atrophy. Stable, moderate ill-defined hypoattenuation within the cerebral white matter which is nonspecific, but consistent with chronic small vessel ischemic disease. There is no acute intracranial hemorrhage. No demarcated cortical infarct. No extra-axial fluid collection. No evidence of intracranial mass. No midline shift. Vascular: No hyperdense vessel.  Atherosclerotic calcifications. Skull: Normal. Negative for fracture or focal lesion. Sinuses/Orbits: Visualized orbits show no acute finding. Left maxillary sinus air-fluid level. Air-fluid level and frothy secretions within the right sphenoid sinus. No significant mastoid effusion. Other: Right frontoparietal scalp hematoma. CT CERVICAL SPINE FINDINGS Alignment: No significant spondylolisthesis. Skull base and vertebrae: The basion-dental and atlanto-dental intervals are maintained.No evidence of acute fracture to the cervical spine. Soft tissues and spinal canal: No prevertebral fluid or swelling. No visible canal hematoma. Disc levels: No significant bony spinal canal or neural foraminal narrowing at any level. Upper chest: No consolidation  within the imaged lung apices. No visible pneumothorax. Other: Redemonstrated 3 cm right thyroid lobe nodule. IMPRESSION: CT head: 1. No evidence of acute intracranial abnormality. 2. Right frontoparietal scalp hematoma. 3. Stable mild generalized cerebral atrophy and moderate chronic small vessel ischemic disease. 4. Left maxillary and right sphenoid sinusitis. CT cervical spine: 1. No evidence of acute fracture to the cervical spine. 2. Redemonstrated 3 cm right thyroid lobe nodule. This nodule was previously assessed with thyroid ultrasound on 01/15/2019. Please refer to this prior report for further description and recommendations. Electronically Signed   By: Kellie Simmering DO   On: 02/15/2020 15:22   DG Chest Portable 1 View  Result Date: 02/15/2020 CLINICAL DATA:  Fall. EXAM: PORTABLE CHEST 1 VIEW COMPARISON:  01/18/2020. FINDINGS: Mediastinum and hilar structures normal. Heart size normal. Lung volumes. Mild bibasilar atelectasis. Degenerative changes scoliosis thoracic spine. IMPRESSION: Low lung volumes with mild bibasilar atelectasis. Electronically Signed   By: Marcello Moores  Register   On: 02/15/2020 13:39   CT Renal Stone Study  Result Date: 02/15/2020 CLINICAL DATA:  Flank pain, kidney stone suspected Patient presents to the emergency room for headache after fall. Patient was at rehab for a fall 3 weeks ago that had resulted in a kidney injury. EXAM: CT ABDOMEN AND PELVIS WITHOUT CONTRAST TECHNIQUE: Multidetector CT imaging of the abdomen and pelvis was performed following the standard protocol without IV contrast. COMPARISON:  Noncontrast CT 01/19/2020 FINDINGS: Lower chest: Elevated right hemidiaphragm with compressive atelectasis at the right lung base and air bronchograms. Previous pleural effusions have resolved. Coronary artery and mitral annulus calcifications. Patulous esophagus with fluid level. Decreased density of blood pool suggesting anemia. Hepatobiliary: Focal fatty infiltration adjacent  to the falciform ligament. No suspicious focal hepatic lesion. Mild background hepatic steatosis. Clips in the gallbladder fossa postcholecystectomy. No biliary dilatation. Pancreas: Parenchymal atrophy.  No ductal dilatation or inflammation. Spleen: Normal in size without focal abnormality. Adrenals/Urinary Tract: Normal adrenal glands. Mild bilateral renal parenchymal thinning without hydronephrosis or focal renal abnormality. Mild symmetric perinephric edema appears similar to prior exam and is likely chronic. No renal calculi. Both ureters are decompressed without stones along the course. Foley catheter decompresses the urinary bladder. Stomach/Bowel: Patulous distal esophagus with air-fluid level and small to moderate hiatal hernia. There is no gastric wall thickening. Surgical clips adjacent to the pylorus. Small duodenal diverticulum without acute inflammatory change. There is no small bowel obstruction or inflammation. High-riding cecum in the right upper quadrant, unchanged from prior exam. Appendix not visualized. Diminished stool burden from prior exam. Diverticulosis of the sigmoid colon without diverticulitis. No bowel inflammation. Vascular/Lymphatic: Aorto bi-iliac atherosclerosis. No aortic aneurysm. There is no bulky abdominopelvic adenopathy. Reproductive: Uterus and bilateral adnexa are unremarkable. Other: Improved presacral edema from prior. No ascites. There is no free air. No abdominal wall hernia. Musculoskeletal: Degenerative change in the spine with Modic endplate changes at W1-U9. No acute fracture. The bones are diffusely under mineralized. IMPRESSION: 1. No renal stones or obstructive uropathy. 2. Elevated right hemidiaphragm with compressive atelectasis at the right lung base. Previous pleural effusions have resolved. 3. Patulous distal esophagus with air-fluid level, suggesting reflux or dysmotility. Small to moderate hiatal hernia. 4. Colonic diverticulosis without diverticulitis.  Aortic Atherosclerosis (ICD10-I70.0). Electronically Signed   By: Keith Rake M.D.   On: 02/15/2020 15:24    Procedures Procedures (including critical care time)  Medications Ordered in ED Medications  sodium chloride 0.9 % bolus 250 mL (0 mLs Intravenous Stopped 02/15/20 1415)  fentaNYL (SUBLIMAZE) injection 25 mcg (25 mcg Intravenous Given 02/15/20 1337)  fentaNYL (SUBLIMAZE) injection 25 mcg (25 mcg Intravenous Given 02/15/20 1410)  LORazepam (ATIVAN) injection 0.5 mg (0.5 mg Intravenous Given 02/15/20 1448)    ED Course  I have reviewed the triage vital signs and the nursing notes.  Pertinent labs & imaging results that were available during my care of the patient were reviewed by me and considered in my medical decision making (see chart for details).  Clinical Course as of Feb 15 1732  Thu Feb 15, 2020  1313 CT tech   [BM]  [place] Dr. Roosevelt Locks   [BM]    Clinical Course User Index [BM] Gari Crown   MDM Rules/Calculators/A&P                          Additional history obtained from: 1. Nursing notes from this visit. 2. Electronic medical record review.  Geriatric medicine note from today reviewed.  Per their assessment and plan patient with new AKI unable to start IV at skilled nursing facility so she was sent to the ER.  She also had Klebsiella UTI they have been treating with levofloxacin for the past 5 days.  Patient had back pain from fall off bed and what appears to be some confusion to date. Appears patient has a history of dementia. 3. Attempted to obtain supplemental history from listed family member Chloe Harding, no answer. ------------------------------------- 74 year old female presented from nursing home after she fell out of bed this morning suffered hematoma of the right forehead.  She has multiple small skin tears which do not necessitate repair.  She denies any other injuries.  No evidence of injury of the neck, chest, abdomen, pelvis.  Additionally moves  all 4 extremities in all major joints with appropriate range of motion and  strength for age.  She appears somewhat tired and confused but has a history of dementia.  CT head/cervical spine ordered in addition to basic blood work given PCP note showing AKI.  Patient seen and evaluated by Dr. Ralene Bathe and CT renal stone study was added to work-up, patient has history of pleural effusions, will gently rehydrate with 250 mL for now. - Patient became agitated in CT scanner, she required 0.5 mg Ativan and then tolerated exam well.  I reviewed and interpreted labs which include: BMP shows AKI with creatinine 2.53, BUN 37.  No emergent electrolyte derangement or gap. Urinalysis with evidence of infection, urine culture sent. CBC shows baseline hemoglobin of 10.5, no leukocytosis.  DG Pelvis:    IMPRESSION:  Negative.   Chest x-ray: Report will not crossover.  Impression: Low lung volumes with mild bibasilar atelectasis.  CT head/cervical spine: Report would not crossover.  Impressions: No evidence of acute intracranial abnormality.  Right frontoparietal scalp hematoma.  Stable mild generalized cerebral atrophy and moderate chronic small vessel ischemic disease.  Left maxillary and right sphenoid sinusitis.  No evidence of acute fracture of the cervical spine.  Redemonstrated 3 cm right thyroid lobe nodule seen on previous ultrasound's January 15, 2019.  CT renal stone study: Report will not crossover.  Impressions: No renal stone or obstructive uropathy.  Elevated right hemidiaphragm with compressive atelectasis at the right lung base.  Previous pleural effusions have resolved.  Patulous distal esophagus with air-fluid level suggesting reflux or dysmotility.  Small to moderate hiatal hernia.  Colonic diverticulosis without diverticulitis.  Aortic atherosclerosis. - Patient reassessed, resting comfortably no acute distress, sleeping.  Is arousable to voice.  Reports she is feeling well has no current  complaints.  She is agreeable to discharge.  I attempted to call patient's family members for supplemental history and for updates and there was no answer.  Rediscussed case with Dr. Ralene Bathe who agrees with admission to medicine for AKI. - 7:19 PM: Discussed case with Dr. Roosevelt Locks, patient accepted to hospitalist service.  After admission patient's husband Chloe Harding called my phone, he advised that patient had been vomiting over the last day and they were concerned for kidney stone initially which is why she was sent into the hospital per Chloe Harding.  Added on LFTs, no evidence of kidney stone on CT scan today.  Patient admitted to hospitalist service.    Note: Portions of this report may have been transcribed using voice recognition software. Every effort was made to ensure accuracy; however, inadvertent computerized transcription errors may still be present. Final Clinical Impression(s) / ED Diagnoses Final diagnoses:  AKI (acute kidney injury) ([place])  Fall, initial encounter  Injury of head, initial encounter  Multiple skin tears    Rx / DC Orders ED Discharge Orders    None       Gari Crown 02/15/20 1734    Quintella Reichert, MD 02/16/20 [phone]

## 2020-02-15 NOTE — Assessment & Plan Note (Signed)
EKG 01/18/2020 revealed sinus rhythm with a rate of 82.  QTc was 492.  Today she has a persistent tachycardia with heart rates averaging approximately 110.  This is in the context of Plaquenil (hydroxychloroquine).  Plaquenil will be held until renal function improves and cardiac status verified.

## 2020-02-15 NOTE — Patient Instructions (Signed)
See assessment and plan under each diagnosis in the problem list and acutely for this visit 

## 2020-02-15 NOTE — ED Triage Notes (Signed)
Pt had a fall at 0800 hematoma to ride side of head and laceration to right arm, that was cleaned by [facility] staff. Pt began to c/o headache this afternoon so was sent for evaluation. Denies n/v/ acute vision changes. In rehab for a fall three weeks ago that resulted in a kidney injury.

## 2020-02-15 NOTE — H&P (Signed)
History and Physical    Chloe Harding:[id number] DOB: Nov 16, 1945 DOA: 02/15/2020  PCP: [name], NP (Confirm with patient/family/NH records and if not entered, this has to be entered at [facility] point of entry) Patient coming from: SNF  I have personally briefly reviewed patient's old medical records in [place]  Chief Complaint: AMS  HPI: Chloe Harding is a 75 y.o. female with medical history significant of CKD stage II, rheumatoid arthritis, GERD, hypertension, chronic systolic CHF with EF 45 to 50%, presented with fall and AKI.  Patient was hospitalized 3 weeks ago and discharged to nursing home for rehab.  Patient had a Foley on discharge with was maintained in the nursing home.  Last week, patient was diagnosed with UTI, culture showed Klebsiella, patient was started on Levaquin 500 mg twice daily since 09/25 for a planned 7 days course, and her Foley was exchanged on 09/28.  Her baseline creatinine function after hospitalization last time was 0.7, and her 2 most recent reading of creatinine level in the SNF was 1.3 in 09/25 and 2.6 yesterday.  According to nursing home record, patient was very agitated yesterday and complained about back pain, and today patient had a fall this morning with right arm laceration and forehead hematoma.  Patient was very agitated on arrival, was given multiple sedations for CT scan and fentanyl, and remained quite confused at the time I saw her.  Most history was obtained from ED staff and nursing home transfer record.  ED Course: CT abdomen without contrast showed no significant hydro, CT head negative for acute findings other than scalp hematoma.  Lab value showed AKI with creatinine 2.5, hypokalemia 3.2. Review of Systems: Unable to perform, patient confused  Past Medical History:  Diagnosis Date  . Arthritis   . Depression   . Diabetes mellitus   . Hiatal hernia   . Hypercholesteremia   . Hypertension   . Reflux   . Renal disorder     shutting down 4 years ago    Past Surgical History:  Procedure Laterality Date  . BALLOON DILATION N/A 01/25/2020   Procedure: BALLOON DILATION;  Surgeon: Carol Ada, MD;  Location: [facility];  Service: Endoscopy;  Laterality: N/A;  . CHOLECYSTECTOMY    . COLONOSCOPY WITH PROPOFOL N/A 05/20/2017   Procedure: COLONOSCOPY WITH PROPOFOL;  Surgeon: Carol Ada, MD;  Location: [facility];  Service: Endoscopy;  Laterality: N/A;  . ESOPHAGOGASTRODUODENOSCOPY N/A 05/19/2017   Procedure: ESOPHAGOGASTRODUODENOSCOPY (EGD);  Surgeon: Juanita Craver, MD;  Location: [facility] ENDOSCOPY;  Service: Endoscopy;  Laterality: N/A;  . ESOPHAGOGASTRODUODENOSCOPY (EGD) WITH PROPOFOL N/A 01/17/2019   Procedure: ESOPHAGOGASTRODUODENOSCOPY (EGD) WITH PROPOFOL;  Surgeon: Carol Ada, MD;  Location: [facility] ENDOSCOPY;  Service: Endoscopy;  Laterality: N/A;  . ESOPHAGOGASTRODUODENOSCOPY (EGD) WITH PROPOFOL N/A 01/25/2020   Procedure: ESOPHAGOGASTRODUODENOSCOPY (EGD) WITH PROPOFOL;  Surgeon: Carol Ada, MD;  Location: [facility] ENDOSCOPY;  Service: Endoscopy;  Laterality: N/A;  . SAVORY DILATION N/A 01/17/2019   Procedure: SAVORY DILATION;  Surgeon: Carol Ada, MD;  Location: [facility] ENDOSCOPY;  Service: Endoscopy;  Laterality: N/A;     reports that she has never smoked. She has never used smokeless tobacco. She reports that she does not drink alcohol and does not use drugs.  Allergies  Allergen Reactions  . Amlodipine Besylate Swelling    swelling    Family History  Problem Relation Age of Onset  . Heart attack Sister   . Heart failure Sister   . Heart attack Brother   .  Heart failure Brother      Prior to Admission medications   Medication Sig Start Date End Date Taking? Authorizing Provider  acetaminophen (TYLENOL) 325 MG tablet Take 650 mg by mouth every 6 (six) hours as needed.    [provider]  atorvastatin (LIPITOR) 20 MG tablet Take 1 tablet (20 mg total) by mouth daily. 02/14/20   Medina-Vargas, Monina C,  NP  bisacodyl (DULCOLAX) 5 MG EC tablet Take 1 tablet (5 mg total) by mouth daily as needed for moderate constipation. 11/09/19   Amin, Ankit Chirag, MD  CONSTULOSE 10 GM/15ML solution Take 15 mLs (10 g total) by mouth daily as needed for mild constipation. Lactulose 02/14/20   Medina-Vargas, Monina C, NP  escitalopram (LEXAPRO) 10 MG tablet Take 1 tablet (10 mg total) by mouth daily. 02/14/20   Medina-Vargas, Monina C, NP  folic acid (FOLVITE) 1 MG tablet Take 1 tablet (1 mg total) by mouth daily. 02/14/20   Medina-Vargas, Monina C, NP  furosemide (LASIX) 40 MG tablet Take 1 tablet (40 mg total) by mouth daily. 02/14/20   Medina-Vargas, Monina C, NP  glucagon (GLUCAGEN) 1 MG SOLR injection Inject 1 mg into the vein once as needed for low blood sugar.    [provider]  hydroxychloroquine (PLAQUENIL) 200 MG tablet Take 1 tablet (200 mg total) by mouth daily. 02/14/20   Medina-Vargas, Monina C, NP  metoprolol tartrate (LOPRESSOR) 25 MG tablet Take 1 tablet (25 mg total) by mouth 2 (two) times daily. 02/14/20   Medina-Vargas, Monina C, NP  Nutritional Supplement LIQD Take by mouth in the morning, at noon, and at bedtime. MedPass    [provider]  Nutritional Supplements (NUTRITIONAL SUPPLEMENT PO) Take 1 each by mouth daily. Magic Cup     [provider]  ondansetron (ZOFRAN) 4 MG tablet Take 4 mg by mouth every 8 (eight) hours as needed for nausea.    [provider]  pantoprazole (PROTONIX) 40 MG tablet Take 1 tablet (40 mg total) by mouth 2 (two) times daily. 02/14/20   Medina-Vargas, Monina C, NP  polyethylene glycol (MIRALAX / GLYCOLAX) 17 g packet Take 17 g by mouth daily as needed for moderate constipation or severe constipation. 11/09/19   Amin, Jeanella Flattery, MD  potassium chloride (KLOR-CON) 10 MEQ tablet Take 1 tablet (10 mEq total) by mouth daily. 02/14/20   Medina-Vargas, Monina C, NP  ropinirole (REQUIP) 5 MG tablet Take 1 tablet (5 mg total) by mouth at  bedtime. 02/14/20   Medina-Vargas, Monina C, NP  saccharomyces boulardii (FLORASTOR) 250 MG capsule Take 250 mg by mouth daily. 02/10/20 02/17/20  [provider]  senna-docusate (SENOKOT-S) 8.6-50 MG tablet Take 1 tablet by mouth at bedtime. 11/09/19   Amin, Jeanella Flattery, MD  sucralfate (CARAFATE) 1 g tablet Take 1 tablet (1 g total) by mouth 4 (four) times daily -  with meals and at bedtime for 14 days. 02/14/20 02/28/20  Medina-Vargas, Monina C, NP  Vitamin D3 (VITAMIN D) 25 MCG tablet Take 1 tablet (1,000 Units total) by mouth daily. 02/14/20   Medina-VargasJaymes Graff C, NP    Physical Exam: Vitals:   02/15/20 1305 02/15/20 1307 02/15/20 1436 02/15/20 1641  BP:  124/61 115/63 (!) 121/56  Pulse:  (!) 111 (!) 110 (!) 109  Resp:  (!) 25 19 18   Temp:  98.4 F (36.9 C)    TempSrc:  Oral    SpO2:  100% 100% 99%  Weight: 67 kg  Height: 5\' 4"  (1.626 m)       Constitutional: NAD, calm, comfortable Vitals:   02/15/20 1305 02/15/20 1307 02/15/20 1436 02/15/20 1641  BP:  124/61 115/63 (!) 121/56  Pulse:  (!) 111 (!) 110 (!) 109  Resp:  (!) 25 19 18   Temp:  98.4 F (36.9 C)    TempSrc:  Oral    SpO2:  100% 100% 99%  Weight: 67 kg     Height: 5\' 4"  (1.626 m)      Eyes: PERRL, lids and conjunctivae normal ENMT: Mucous membranes are dry. Posterior pharynx clear of any exudate or lesions.Normal dentition.  Neck: normal, supple, no masses, no thyromegaly Respiratory: clear to auscultation bilaterally, no wheezing, no crackles. Normal respiratory effort. No accessory muscle use.  Cardiovascular: Regular rate and rhythm, no murmurs / rubs / gallops. No extremity edema. 2+ pedal pulses. No carotid bruits.  Abdomen: no tenderness, no masses palpated. No hepatosplenomegaly. Bowel sounds positive.  Musculoskeletal: no clubbing / cyanosis. No joint deformity upper and lower extremities. Good ROM, no contractures. Normal muscle tone.  Skin: no rashes, lesions, ulcers. No  induration Neurologic: Following simple command, moving all limbs Psychiatric: Lethargic    Labs on Admission: I have personally reviewed following labs and imaging studies  CBC: Recent Labs  Lab 02/15/20 1315  WBC 7.0  NEUTROABS 3.2  HGB 10.5*  HCT 31.6*  MCV 84.3  PLT 469   Basic Metabolic Panel: Recent Labs  Lab 02/15/20 1315  NA 144  K 3.2*  CL 106  CO2 23  GLUCOSE 72  BUN 37*  CREATININE 2.53*  CALCIUM 9.1   GFR: Estimated Creatinine Clearance: 18.9 mL/min (A) (by C-G formula based on SCr of 2.53 mg/dL (H)). Liver Function Tests: No results for input(s): AST, ALT, ALKPHOS, BILITOT, PROT, ALBUMIN in the last 168 hours. No results for input(s): LIPASE, AMYLASE in the last 168 hours. No results for input(s): AMMONIA in the last 168 hours. Coagulation Profile: No results for input(s): INR, PROTIME in the last 168 hours. Cardiac Enzymes: No results for input(s): CKTOTAL, CKMB, CKMBINDEX, TROPONINI in the last 168 hours. BNP (last 3 results) No results for input(s): PROBNP in the last 8760 hours. HbA1C: No results for input(s): HGBA1C in the last 72 hours. CBG: No results for input(s): GLUCAP in the last 168 hours. Lipid Profile: No results for input(s): CHOL, HDL, LDLCALC, TRIG, CHOLHDL, LDLDIRECT in the last 72 hours. Thyroid Function Tests: No results for input(s): TSH, T4TOTAL, FREET4, T3FREE, THYROIDAB in the last 72 hours. Anemia Panel: No results for input(s): VITAMINB12, FOLATE, FERRITIN, TIBC, IRON, RETICCTPCT in the last 72 hours. Urine analysis:    Component Value Date/Time   COLORURINE YELLOW 02/15/2020 1325   APPEARANCEUR HAZY (A) 02/15/2020 1325   LABSPEC 1.015 02/15/2020 1325   PHURINE 5.0 02/15/2020 1325   GLUCOSEU NEGATIVE 02/15/2020 1325   HGBUR SMALL (A) 02/15/2020 1325   HGBUR trace-intact 05/14/2010 1402   BILIRUBINUR NEGATIVE 02/15/2020 1325   KETONESUR 5 (A) 02/15/2020 1325   PROTEINUR 30 (A) 02/15/2020 1325   UROBILINOGEN 1.0  03/30/2015 1540   NITRITE NEGATIVE 02/15/2020 1325   LEUKOCYTESUR LARGE (A) 02/15/2020 1325    Radiological Exams on Admission: DG Pelvis 1-2 Views  Result Date: 02/15/2020 CLINICAL DATA:  Fall EXAM: PELVIS - 1-2 VIEW COMPARISON:  05/18/2017 FINDINGS: There is no evidence of pelvic fracture or diastasis. No pelvic bone lesions are seen. IMPRESSION: Negative. Electronically Signed   By: Franchot Gallo M.D.  On: 02/15/2020 13:36   CT Head Wo Contrast  Result Date: 02/15/2020 CLINICAL DATA:  Head trauma, moderate/severe. Additional provided: Hematoma to right side of head and laceration to right arm, patient reports headache. EXAM: CT HEAD WITHOUT CONTRAST CT CERVICAL SPINE WITHOUT CONTRAST TECHNIQUE: Multidetector CT imaging of the head and cervical spine was performed following the standard protocol without intravenous contrast. Multiplanar CT image reconstructions of the cervical spine were also generated. COMPARISON:  Head CT 01/21/2020. Brain MRI 01/20/2020. CT cervical spine 12/23/2019. Thyroid ultrasound 01/15/2019 FINDINGS: CT HEAD FINDINGS Brain: Stable, mild generalized cerebral atrophy. Stable, moderate ill-defined hypoattenuation within the cerebral white matter which is nonspecific, but consistent with chronic small vessel ischemic disease. There is no acute intracranial hemorrhage. No demarcated cortical infarct. No extra-axial fluid collection. No evidence of intracranial mass. No midline shift. Vascular: No hyperdense vessel.  Atherosclerotic calcifications. Skull: Normal. Negative for fracture or focal lesion. Sinuses/Orbits: Visualized orbits show no acute finding. Left maxillary sinus air-fluid level. Air-fluid level and frothy secretions within the right sphenoid sinus. No significant mastoid effusion. Other: Right frontoparietal scalp hematoma. CT CERVICAL SPINE FINDINGS Alignment: No significant spondylolisthesis. Skull base and vertebrae: The basion-dental and atlanto-dental  intervals are maintained.No evidence of acute fracture to the cervical spine. Soft tissues and spinal canal: No prevertebral fluid or swelling. No visible canal hematoma. Disc levels: No significant bony spinal canal or neural foraminal narrowing at any level. Upper chest: No consolidation within the imaged lung apices. No visible pneumothorax. Other: Redemonstrated 3 cm right thyroid lobe nodule. IMPRESSION: CT head: 1. No evidence of acute intracranial abnormality. 2. Right frontoparietal scalp hematoma. 3. Stable mild generalized cerebral atrophy and moderate chronic small vessel ischemic disease. 4. Left maxillary and right sphenoid sinusitis. CT cervical spine: 1. No evidence of acute fracture to the cervical spine. 2. Redemonstrated 3 cm right thyroid lobe nodule. This nodule was previously assessed with thyroid ultrasound on 01/15/2019. Please refer to this prior report for further description and recommendations. Electronically Signed   By: Kellie Simmering DO   On: 02/15/2020 15:22   CT Cervical Spine Wo Contrast  Result Date: 02/15/2020 CLINICAL DATA:  Head trauma, moderate/severe. Additional provided: Hematoma to right side of head and laceration to right arm, patient reports headache. EXAM: CT HEAD WITHOUT CONTRAST CT CERVICAL SPINE WITHOUT CONTRAST TECHNIQUE: Multidetector CT imaging of the head and cervical spine was performed following the standard protocol without intravenous contrast. Multiplanar CT image reconstructions of the cervical spine were also generated. COMPARISON:  Head CT 01/21/2020. Brain MRI 01/20/2020. CT cervical spine 12/23/2019. Thyroid ultrasound 01/15/2019 FINDINGS: CT HEAD FINDINGS Brain: Stable, mild generalized cerebral atrophy. Stable, moderate ill-defined hypoattenuation within the cerebral white matter which is nonspecific, but consistent with chronic small vessel ischemic disease. There is no acute intracranial hemorrhage. No demarcated cortical infarct. No extra-axial  fluid collection. No evidence of intracranial mass. No midline shift. Vascular: No hyperdense vessel.  Atherosclerotic calcifications. Skull: Normal. Negative for fracture or focal lesion. Sinuses/Orbits: Visualized orbits show no acute finding. Left maxillary sinus air-fluid level. Air-fluid level and frothy secretions within the right sphenoid sinus. No significant mastoid effusion. Other: Right frontoparietal scalp hematoma. CT CERVICAL SPINE FINDINGS Alignment: No significant spondylolisthesis. Skull base and vertebrae: The basion-dental and atlanto-dental intervals are maintained.No evidence of acute fracture to the cervical spine. Soft tissues and spinal canal: No prevertebral fluid or swelling. No visible canal hematoma. Disc levels: No significant bony spinal canal or neural foraminal narrowing at any level. Upper chest: No  consolidation within the imaged lung apices. No visible pneumothorax. Other: Redemonstrated 3 cm right thyroid lobe nodule. IMPRESSION: CT head: 1. No evidence of acute intracranial abnormality. 2. Right frontoparietal scalp hematoma. 3. Stable mild generalized cerebral atrophy and moderate chronic small vessel ischemic disease. 4. Left maxillary and right sphenoid sinusitis. CT cervical spine: 1. No evidence of acute fracture to the cervical spine. 2. Redemonstrated 3 cm right thyroid lobe nodule. This nodule was previously assessed with thyroid ultrasound on 01/15/2019. Please refer to this prior report for further description and recommendations. Electronically Signed   By: Kellie Simmering DO   On: 02/15/2020 15:22   DG Chest Portable 1 View  Result Date: 02/15/2020 CLINICAL DATA:  Fall. EXAM: PORTABLE CHEST 1 VIEW COMPARISON:  01/18/2020. FINDINGS: Mediastinum and hilar structures normal. Heart size normal. Lung volumes. Mild bibasilar atelectasis. Degenerative changes scoliosis thoracic spine. IMPRESSION: Low lung volumes with mild bibasilar atelectasis. Electronically Signed   By:  Marcello Moores  Register   On: 02/15/2020 13:39   CT Renal Stone Study  Result Date: 02/15/2020 CLINICAL DATA:  Flank pain, kidney stone suspected Patient presents to the emergency room for headache after fall. Patient was at rehab for a fall 3 weeks ago that had resulted in a kidney injury. EXAM: CT ABDOMEN AND PELVIS WITHOUT CONTRAST TECHNIQUE: Multidetector CT imaging of the abdomen and pelvis was performed following the standard protocol without IV contrast. COMPARISON:  Noncontrast CT 01/19/2020 FINDINGS: Lower chest: Elevated right hemidiaphragm with compressive atelectasis at the right lung base and air bronchograms. Previous pleural effusions have resolved. Coronary artery and mitral annulus calcifications. Patulous esophagus with fluid level. Decreased density of blood pool suggesting anemia. Hepatobiliary: Focal fatty infiltration adjacent to the falciform ligament. No suspicious focal hepatic lesion. Mild background hepatic steatosis. Clips in the gallbladder fossa postcholecystectomy. No biliary dilatation. Pancreas: Parenchymal atrophy. No ductal dilatation or inflammation. Spleen: Normal in size without focal abnormality. Adrenals/Urinary Tract: Normal adrenal glands. Mild bilateral renal parenchymal thinning without hydronephrosis or focal renal abnormality. Mild symmetric perinephric edema appears similar to prior exam and is likely chronic. No renal calculi. Both ureters are decompressed without stones along the course. Foley catheter decompresses the urinary bladder. Stomach/Bowel: Patulous distal esophagus with air-fluid level and small to moderate hiatal hernia. There is no gastric wall thickening. Surgical clips adjacent to the pylorus. Small duodenal diverticulum without acute inflammatory change. There is no small bowel obstruction or inflammation. High-riding cecum in the right upper quadrant, unchanged from prior exam. Appendix not visualized. Diminished stool burden from prior exam.  Diverticulosis of the sigmoid colon without diverticulitis. No bowel inflammation. Vascular/Lymphatic: Aorto bi-iliac atherosclerosis. No aortic aneurysm. There is no bulky abdominopelvic adenopathy. Reproductive: Uterus and bilateral adnexa are unremarkable. Other: Improved presacral edema from prior. No ascites. There is no free air. No abdominal wall hernia. Musculoskeletal: Degenerative change in the spine with Modic endplate changes at E1-D4. No acute fracture. The bones are diffusely under mineralized. IMPRESSION: 1. No renal stones or obstructive uropathy. 2. Elevated right hemidiaphragm with compressive atelectasis at the right lung base. Previous pleural effusions have resolved. 3. Patulous distal esophagus with air-fluid level, suggesting reflux or dysmotility. Small to moderate hiatal hernia. 4. Colonic diverticulosis without diverticulitis. Aortic Atherosclerosis (ICD10-I70.0). Electronically Signed   By: Keith Rake M.D.   On: 02/15/2020 15:24    EKG: Ordered  Assessment/Plan Active Problems:   * No active hospital problems. *  (please populate well all problems here in Problem List. (For example, if patient is  on BP meds at home and you resume or decide to hold them, it is a problem that needs to be her. Same for CAD, COPD, HLD and so on)  AKI on CKD stage II -Several possibilities, clinically patient appears to be dry with tachycardia, physical exam showed the mucous membrane dry, without was a high dose of Levaquin 500 mg twice daily and her creatinine level has been slowly creeping up: 1.3 on 09/22, creatinine level increased to 2.6 yesterday and slightly decreased to 2.5 today, thus, Levaquin toxicity should also be considered.  We will stop Levaquin.  ED gave 1.25 L IV boluses, and urine output barely 30 mL/h after after ED IV boluses, will give 1 more liters IV bolus and maintenance IV fluid with normal saline at 125 mL/h for 12 hours then reevaluate in the morning. -CT pelvis  showed no obvious hydronephrosis or stones. -Indwelling Foley was exchanged on 09/28 in nursing home, will keep Foley for now given AKI.  Plan for a void trial after AKI resolved. -Patient on Lasix, FeNa will not informative in this case, will send CK and uric acid level.  Seizure nephrology consult tomorrow if kidney function not improving.  Syncope -Likely from vasovagal from hypovolemia/dehydration -Hydration plan as above, reevaluate orthostatic vital signs tomorrow -Most recent A1c 8.6, indicating elevated risk of hypoglycemia, check fingerstick 3 times daily AC.  But for her diabetic medication discontinue on last admission. -QTC >500 last 2 readings in August and earlier September this year, send a repeat EKG today -CT head reassuring  Complicated UTI with indwelling Foley catheter -Urine culture from 09/24 showed Klebsiella sensitive to fluoroquinolone and patient received 5 days of Levaquin.  Changed to ceftriaxone to complete 7 days course.  Indwelling Foley catheter -Foley was placed in the last admission about 4 weeks ago, according to nursing home record, Foley was exchanged on 09/28.  Now patient has AKI, for accurate input output, decided to maintain Foley, once AKI resolved, showed accelerated void trial and DC Foley eventually.  Chronic systolic CHF -She is hypovolemia now, hold off Lasix  Prolonged QTC -As above, will hold off hydroxychloroquine for now  GERD and esophageal stenosis -On PPI, status post on last admission  DVT prophylaxis: Heparin subcu Code Status: Full code Family Communication: Left daughter message Disposition Plan: Patient has repeated syncope/encephalopathy problem.  Came with recurrent syncope for and AKI, expect more than 2 midnight hospital stay Consults called: None Admission status: Telemetry   Lequita Halt MD [facility] Pager [phone]  02/15/2020, 5:25 PM

## 2020-02-16 ENCOUNTER — Other Ambulatory Visit: Payer: Self-pay

## 2020-02-16 DIAGNOSIS — E876 Hypokalemia: Secondary | ICD-10-CM

## 2020-02-16 DIAGNOSIS — Z8719 Personal history of other diseases of the digestive system: Secondary | ICD-10-CM

## 2020-02-16 DIAGNOSIS — W19XXXA Unspecified fall, initial encounter: Secondary | ICD-10-CM

## 2020-02-16 DIAGNOSIS — A419 Sepsis, unspecified organism: Secondary | ICD-10-CM

## 2020-02-16 DIAGNOSIS — N189 Chronic kidney disease, unspecified: Secondary | ICD-10-CM

## 2020-02-16 DIAGNOSIS — R7989 Other specified abnormal findings of blood chemistry: Secondary | ICD-10-CM

## 2020-02-16 DIAGNOSIS — T83511A Infection and inflammatory reaction due to indwelling urethral catheter, initial encounter: Principal | ICD-10-CM

## 2020-02-16 DIAGNOSIS — R1319 Other dysphagia: Secondary | ICD-10-CM

## 2020-02-16 DIAGNOSIS — I5042 Chronic combined systolic (congestive) and diastolic (congestive) heart failure: Secondary | ICD-10-CM

## 2020-02-16 DIAGNOSIS — G9341 Metabolic encephalopathy: Secondary | ICD-10-CM

## 2020-02-16 DIAGNOSIS — Y92129 Unspecified place in nursing home as the place of occurrence of the external cause: Secondary | ICD-10-CM

## 2020-02-16 DIAGNOSIS — E870 Hyperosmolality and hypernatremia: Secondary | ICD-10-CM

## 2020-02-16 DIAGNOSIS — N390 Urinary tract infection, site not specified: Secondary | ICD-10-CM

## 2020-02-16 DIAGNOSIS — D696 Thrombocytopenia, unspecified: Secondary | ICD-10-CM

## 2020-02-16 DIAGNOSIS — Z66 Do not resuscitate: Secondary | ICD-10-CM

## 2020-02-16 DIAGNOSIS — Z7189 Other specified counseling: Secondary | ICD-10-CM

## 2020-02-16 DIAGNOSIS — N179 Acute kidney failure, unspecified: Secondary | ICD-10-CM

## 2020-02-16 DIAGNOSIS — R5381 Other malaise: Secondary | ICD-10-CM

## 2020-02-16 DIAGNOSIS — D500 Iron deficiency anemia secondary to blood loss (chronic): Secondary | ICD-10-CM

## 2020-02-16 DIAGNOSIS — R6520 Severe sepsis without septic shock: Secondary | ICD-10-CM

## 2020-02-16 LAB — BASIC METABOLIC PANEL
Anion gap: 13 (ref 5–15)
BUN: 28 mg/dL — ABNORMAL HIGH (ref 8–23)
CO2: 19 mmol/L — ABNORMAL LOW (ref 22–32)
Calcium: 8.1 mg/dL — ABNORMAL LOW (ref 8.9–10.3)
Chloride: 115 mmol/L — ABNORMAL HIGH (ref 98–111)
Creatinine, Ser: 1.85 mg/dL — ABNORMAL HIGH (ref 0.44–1.00)
GFR calc Af Amer: 31 mL/min — ABNORMAL LOW (ref >60)
GFR calc non Af Amer: 27 mL/min — ABNORMAL LOW (ref >60)
Glucose, Bld: 77 mg/dL (ref 70–99)
Potassium: 3.8 mmol/L (ref 3.5–5.1)
Sodium: 147 mmol/L — ABNORMAL HIGH (ref 135–145)

## 2020-02-16 LAB — GLUCOSE, CAPILLARY
Glucose-Capillary: 105 mg/dL — ABNORMAL HIGH (ref 70–99)
Glucose-Capillary: 107 mg/dL — ABNORMAL HIGH (ref 70–99)
Glucose-Capillary: 59 mg/dL — ABNORMAL LOW (ref 70–99)
Glucose-Capillary: 69 mg/dL — ABNORMAL LOW (ref 70–99)
Glucose-Capillary: 73 mg/dL (ref 70–99)
Glucose-Capillary: 80 mg/dL (ref 70–99)
Glucose-Capillary: 87 mg/dL (ref 70–99)

## 2020-02-16 LAB — CBC
HCT: 29.9 % — ABNORMAL LOW (ref 36.0–46.0)
Hemoglobin: 9.3 g/dL — ABNORMAL LOW (ref 12.0–15.0)
MCH: 27.6 pg (ref 26.0–34.0)
MCHC: 31.1 g/dL (ref 30.0–36.0)
MCV: 88.7 fL (ref 80.0–100.0)
Platelets: 139 10*3/uL — ABNORMAL LOW (ref 150–400)
RBC: 3.37 MIL/uL — ABNORMAL LOW (ref 3.87–5.11)
RDW: 16.7 % — ABNORMAL HIGH (ref 11.5–15.5)
WBC: 4.5 10*3/uL (ref 4.0–10.5)
nRBC: 0 % (ref 0.0–0.2)

## 2020-02-16 LAB — URINE CULTURE: Culture: NO GROWTH

## 2020-02-16 MED ORDER — SODIUM CHLORIDE 0.45 % IV SOLN: INTRAVENOUS | Status: AC

## 2020-02-16 MED ORDER — ADULT MULTIVITAMIN W/MINERALS CH
1.0000 | ORAL_TABLET | Freq: Every day | ORAL | Status: DC
  Administered 2020-02-16 – 2020-02-18 (×3): 1 via ORAL
  Filled 2020-02-16 (×3): qty 1

## 2020-02-16 MED ORDER — CHLORHEXIDINE GLUCONATE CLOTH 2 % EX PADS
6.0000 | MEDICATED_PAD | Freq: Every day | CUTANEOUS | Status: DC
  Administered 2020-02-16 – 2020-02-18 (×3): 6 via TOPICAL

## 2020-02-16 MED ORDER — BETHANECHOL CHLORIDE 10 MG PO TABS
5.0000 mg | ORAL_TABLET | Freq: Three times a day (TID) | ORAL | Status: DC
  Administered 2020-02-16 – 2020-02-18 (×6): 5 mg via ORAL
  Filled 2020-02-16 (×4): qty 0.5
  Filled 2020-02-16: qty 1
  Filled 2020-02-16 (×3): qty 0.5

## 2020-02-16 MED ORDER — ENSURE ENLIVE PO LIQD
237.0000 mL | Freq: Three times a day (TID) | ORAL | Status: DC
  Administered 2020-02-16 – 2020-02-18 (×6): 237 mL via ORAL

## 2020-02-16 NOTE — Plan of Care (Signed)

## 2020-02-16 NOTE — NC FL2 (Signed)
[place] LEVEL OF CARE SCREENING TOOL     IDENTIFICATION  Patient Name: Chloe Harding Birthdate: 12-10-1945 Sex: female Admission Date (Current Location): 02/15/2020  [facility] and [place] Number:  Herbalist and Address:  [facility],  [place], [place]      Provider Number: [id number]  Attending Physician Name and Address:  Mercy Riding, MD  Relative Name and Phone Number:  Aram Candela [phone]    Current Level of Care: Hospital Recommended Level of Care: [place] Prior Approval Number:    Date Approved/Denied:   PASRR Number: [id number]  Discharge Plan: SNF    Current Diagnoses: Patient Active Problem List   Diagnosis Date Noted  . Tachycardia 02/15/2020  . Fall   . Neurocognitive deficits 01/30/2020  . AKI (acute kidney injury) ([place]) 01/19/2020  . Acute encephalopathy 01/18/2020  . Diarrhea 12/23/2019  . SOB (shortness of breath)   . Demand ischemia ([place])   . CAD in native artery   . Pneumonia 11/05/2019  . Hyponatremia 11/05/2019  . Acute CHF (congestive heart failure) ([place]) 11/05/2019  . Esophageal stricture   . Dysphagia 01/15/2019  . Symptomatic anemia 05/18/2017  . Acute-on-chronic kidney injury ([place]) 05/18/2017  . Hypertension 05/18/2017  . Hiatal hernia 05/18/2017  . History of esophageal stricture 05/18/2017  . Pure hypercholesterolemia 05/18/2017  . Diabetes mellitus 05/18/2017  . Solitary thyroid nodule 05/18/2017  . Hypotension 03/30/2015  . Sepsis ([place]) 03/30/2015  . CAP (community acquired pneumonia) 03/30/2015  . Hyperkalemia 03/30/2015  . Mild renal insufficiency 03/30/2015  . Nausea with vomiting 03/30/2015  . Diabetic neuropathy ([place]) 03/30/2015  . OBESITY 03/11/2010  . Diabetes with neurologic complications ([place]) 74/04/8785  . Dyslipidemia 12/26/2009  . ANEMIA 12/26/2009  . Major depression, recurrent ([place]) 12/26/2009  . HYPERTENSION, BENIGN  ESSENTIAL 12/26/2009  . GERD 12/26/2009  . HIATAL HERNIA 12/26/2009  . CHRONIC KIDNEY DISEASE STAGE I 12/26/2009  . UTI due to Klebsiella species 12/26/2009  . Arthropathy 12/26/2009  . ARTHROSCOPY, RIGHT KNEE, HX OF 12/26/2009    Orientation RESPIRATION BLADDER Height & Weight     Self  Normal Incontinent Weight: 67 kg Height:  5\' 4"  (162.6 cm)  BEHAVIORAL SYMPTOMS/MOOD NEUROLOGICAL BOWEL NUTRITION STATUS      Incontinent    AMBULATORY STATUS COMMUNICATION OF NEEDS Skin   Extensive Assist Verbally Normal                       Personal Care Assistance Level of Assistance  Bathing, Feeding, Dressing Bathing Assistance: Maximum assistance Feeding assistance: Limited assistance Dressing Assistance: Maximum assistance     Functional Limitations Info  Sight Sight Info: Adequate Hearing Info: Adequate Speech Info: Adequate    SPECIAL CARE FACTORS FREQUENCY  PT (By licensed PT), OT (By licensed OT), Speech therapy     PT Frequency: 5x week OT Frequency: 5x week     Speech Therapy Frequency: 3x week      Contractures Contractures Info: Not present    Additional Factors Info  Code Status Code Status Info: FULL Allergies Info: Amlodipine Besylate           Current Medications (02/16/2020):  This is the current hospital active medication list Current Facility-Administered Medications  Medication Dose Route Frequency Provider Last Rate Last Admin  . 0.45 % sodium chloride infusion   Intravenous Continuous Wendee Beavers T, MD 60 mL/hr at 02/16/20 1500 New Bag at 02/16/20 1500  .  acetaminophen (TYLENOL) tablet 650 mg  650 mg Oral Q6H PRN Wynetta Fines T, MD   650 mg at 02/16/20 0300   Or  . acetaminophen (TYLENOL) suppository 650 mg  650 mg Rectal Q6H PRN Wynetta Fines T, MD      . atorvastatin (LIPITOR) tablet 20 mg  20 mg Oral Daily Wynetta Fines T, MD   20 mg at 02/16/20 0934  . cefTRIAXone (ROCEPHIN) 1 g in sodium chloride 0.9 % 100 mL IVPB  1 g Intravenous Q24H  Lequita Halt, MD   Stopped at 02/15/20 1857  . Chlorhexidine Gluconate Cloth 2 % PADS 6 each  6 each Topical Daily Mercy Riding, MD   6 each at 02/16/20 0934  . escitalopram (LEXAPRO) tablet 10 mg  10 mg Oral Daily Wynetta Fines T, MD   10 mg at 02/16/20 0934  . feeding supplement (ENSURE ENLIVE) (ENSURE ENLIVE) liquid 237 mL  237 mL Oral TID BM Gonfa, Taye T, MD      . folic acid (FOLVITE) tablet 1 mg  1 mg Oral Daily Wynetta Fines T, MD   1 mg at 02/16/20 0934  . heparin injection 5,000 Units  5,000 Units Subcutaneous Q12H Lequita Halt, MD   5,000 Units at 02/16/20 0933  . insulin aspart (novoLOG) injection 0-9 Units  0-9 Units Subcutaneous TID WC Wynetta Fines T, MD      . metoprolol tartrate (LOPRESSOR) tablet 25 mg  25 mg Oral BID Wynetta Fines T, MD   25 mg at 02/16/20 0934  . multivitamin with minerals tablet 1 tablet  1 tablet Oral Daily Gonfa, Taye T, MD      . ondansetron (ZOFRAN) tablet 4 mg  4 mg Oral Q8H PRN Wynetta Fines T, MD      . pantoprazole (PROTONIX) EC tablet 40 mg  40 mg Oral BID Wynetta Fines T, MD   40 mg at 02/16/20 0934  . polyethylene glycol (MIRALAX / GLYCOLAX) packet 17 g  17 g Oral Daily PRN Wynetta Fines T, MD      . saccharomyces boulardii (FLORASTOR) capsule 250 mg  250 mg Oral Daily Wynetta Fines T, MD   250 mg at 02/16/20 0934  . senna-docusate (Senokot-S) tablet 1 tablet  1 tablet Oral QHS Lequita Halt, MD   1 tablet at 02/15/20 2306  . sucralfate (CARAFATE) tablet 1 g  1 g Oral TID WC & HS Lequita Halt, MD   1 g at 02/16/20 1229     Discharge Medications: Please see discharge summary for a list of discharge medications.  Relevant Imaging Results:  Relevant Lab Results:   Additional Information SS#[id number]  Purcell Mouton, RN

## 2020-02-16 NOTE — Progress Notes (Signed)
Received report from ER nurse @ 2337 CBG was 71 once patient arrived to [facility] @ 1200 am CBG rechecked and was found to be 59 was given juice per protocol and rechecked within 15 minutes and now up to 80

## 2020-02-16 NOTE — TOC Progression Note (Signed)
Transition of Care [facility]) - Progression Note    Patient Details  Name: Chloe Harding MRN: [id number] Date of Birth: 26-Aug-1945  [facility] CM/SW Contact  Purcell Mouton, RN Phone Number: 02/16/2020, 3:38 PM  Clinical Narrative:    Spoke with pt's daughter Chloe Harding at pt's bedside concerning discharge plans. Daughter is asking for HHRN/NA/PT/OT/ST at home and hospital bed. Chloe Harding states that [employer] changed to [place] ID [id number].  [facility] was selected.    Expected Discharge Plan: [place] Barriers to Discharge: No Barriers Identified  Expected Discharge Plan and Services Expected Discharge Plan: [place] arrangements for the past 2 months: Single Family Home                           HH Arranged: RN, PT, OT, Nurse's Aide, Speech Therapy HH Agency: [place]         Social Determinants of Health (SDOH) Interventions    Readmission Risk Interventions No flowsheet data found.

## 2020-02-16 NOTE — Evaluation (Signed)
Physical Therapy Evaluation Patient Details Name: Chloe Harding MRN: [id number] DOB: 03-Nov-1945 Today's Date: 02/16/2020   History of Present Illness  74 year old with history of DM2, HTN, HLD, CAD, CHF, CKD stage IIIa presents with generalized weakness and multiple falls at home. Pt previously admitted for CHF exacerbation/pneumonia and heme positive stools. Intermittent behavioral disturbances at home.  Upon admission she was febrile, acute kidney injury, hyponatremia, metabolic encephalopathy.  Clinical Impression  Pt admitted with above diagnosis.  Pt with c/o fatigue and back pain, agreeable to OOB to chair. Will continue to follow in acute setting.  Will benefit from HHPT at d/c   Pt currently with functional limitations due to the deficits listed below (see PT Problem List). Pt will benefit from skilled PT to increase their independence and safety with mobility to allow discharge to the venue listed below.       Follow Up Recommendations Home health PT;Supervision/Assistance - 24 hour    Equipment Recommendations  None recommended by PT    Recommendations for Other Services       Precautions / Restrictions Precautions Precautions: Fall      Mobility  Bed Mobility   Bed Mobility: Supine to Sit     Supine to sit: Min assist;HOB elevated     General bed mobility comments: assist to elevate trunk  Transfers Overall transfer level: Needs assistance Equipment used: Rolling walker (2 wheeled) Transfers: Stand Pivot Transfers;Sit to/from Stand Sit to Stand: Min assist Stand pivot transfers: Min assist       General transfer comment: 2 attempts to come to stand, rest break needed after first attempt. assist to rise and transition to RW. assist for balance and to maneuver RW  for stand pivot  Ambulation/Gait                Stairs            Wheelchair Mobility    Modified Rankin (Stroke Patients Only)       Balance   Sitting-balance support:  Feet supported;Bilateral upper extremity supported Sitting balance-Leahy Scale: Fair     Standing balance support: Bilateral upper extremity supported;During functional activity Standing balance-Leahy Scale: Poor                               Pertinent Vitals/Pain Pain Assessment: Faces Faces Pain Scale: Hurts even more Pain Location: back Pain Descriptors / Indicators: Sore;Grimacing Pain Intervention(s): Monitored during session;Repositioned    Home Living Family/patient expects to be discharged to:: Private residence Living Arrangements: Spouse/significant other;Other relatives (pt sister coming to stay) Available Help at Discharge: Available PRN/intermittently Type of Home: House Home Access: Stairs to enter   Entrance Stairs-Number of Steps: 10 in front, ramp in the back Home Layout: One level Home Equipment: [place] - 2 wheels;Cane - single point;Toilet riser Additional Comments: has a lift chair    Prior Function Level of Independence: Independent with assistive device(s);Needs assistance   Gait / Transfers Assistance Needed: amb with cane or RW at baseline, assist for bed mobility per pt dtr, occasional assist for transfers (especially first thing in the morning)           Hand Dominance        Extremity/Trunk Assessment   Upper Extremity Assessment Upper Extremity Assessment: Generalized weakness    Lower Extremity Assessment Lower Extremity Assessment: Generalized weakness    Cervical / Trunk Assessment Cervical / Trunk Assessment: Kyphotic  Communication  Cognition Arousal/Alertness: Awake/alert Behavior During Therapy: Flat affect   Area of Impairment: Following commands;Attention;Problem solving                   Current Attention Level: Sustained   Following Commands: Follows one step commands with increased time;Follows multi-step commands inconsistently     Problem Solving: Requires verbal cues;Requires tactile  cues;Slow processing;Decreased initiation General Comments: intermittent confusion,  requires redirection to task. pt baseline cognitive status is unclear, dtr present and does not seem to think pt is different that her baseline      General Comments      Exercises     Assessment/Plan    PT Assessment Patient needs continued PT services  PT Problem List Decreased strength;Decreased activity tolerance;Decreased balance;Decreased mobility;Decreased cognition;Decreased knowledge of use of DME;Pain       PT Treatment Interventions DME instruction;Gait training;Functional mobility training;Therapeutic activities;Therapeutic exercise;Cognitive remediation;Patient/family education    PT Goals (Current goals can be found in the Care Plan section)  Acute Rehab PT Goals Patient Stated Goal: agreeable to get to recliner PT Goal Formulation: With patient/family Time For Goal Achievement: 03/01/20 Potential to Achieve Goals: Fair    Frequency Min 3X/week   Barriers to discharge        Co-evaluation               AM-PAC PT "6 Clicks" Mobility  Outcome Measure Help needed turning from your back to your side while in a flat bed without using bedrails?: A Little Help needed moving from lying on your back to sitting on the side of a flat bed without using bedrails?: A Little Help needed moving to and from a bed to a chair (including a wheelchair)?: A Little Help needed standing up from a chair using your arms (e.g., wheelchair or bedside chair)?: A Little Help needed to walk in hospital room?: A Lot Help needed climbing 3-5 steps with a railing? : Total 6 Click Score: 15    End of Session   Activity Tolerance: Patient limited by fatigue Patient left: in chair;with call bell/phone within reach;with chair alarm set;with family/visitor present (no alarm box in room, RN notified) Nurse Communication: Mobility status PT Visit Diagnosis: Difficulty in walking, not elsewhere classified  (R26.2);Muscle weakness (generalized) (M62.81);Other abnormalities of gait and mobility (R26.89)    Time: 9470-9628 PT Time Calculation (min) (ACUTE ONLY): 18 min   Charges:   PT Evaluation $PT Eval Low Complexity: 1 Low          Chloe Harding, PT  Acute Rehab Dept ([facility]) [phone] Pager [phone]  02/16/2020   [facility] 02/16/2020, 4:56 PM
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
## 2020-02-16 NOTE — Plan of Care (Signed)
  Problem: Nutrition: Goal: Adequate nutrition will be maintained Outcome: Progressing   

## 2020-02-16 NOTE — Progress Notes (Signed)
Initial Nutrition Assessment  DOCUMENTATION CODES:   Not applicable  INTERVENTION:  - will increase Ensure Enlive po BID, each supplement provides 350 kcal and 20 grams of protein - will order 1 tablet multivitamin with minerals/day.  NUTRITION DIAGNOSIS:   Increased nutrient needs related to acute illness as evidenced by estimated needs.  GOAL:   Patient will meet greater than or equal to 90% of their needs  MONITOR:   PO intake, Supplement acceptance, Labs, Weight trends  REASON FOR ASSESSMENT:   Malnutrition Screening Tool  ASSESSMENT:   74 y.o. female with medical history of stage 2 CKD, rheumatoid arthritis, GERD, HTN, and CHF. She presented from nursing home s/p fall and with presumed AKI. She was dx with UTI one week PTA. H&P information indicates that patient had been very agitated at nursing home PTA and that she was very agitated on arrival to the ED.  Patient noted to be a/o to self only and notes indicated agitation on admission so did not enter patient's room as felt it would not be of benefit.   She ate 5% of breakfast today; no other intakes documented since admission. Ensure Enlive was ordered once/day. Will increase supplement to TID in case intakes of meals continue to be poor.   Weight yesterday was 148 lb and weight on 9/14 was 164 lb. This indicates 16 lb weight loss (9.7% body weight) in 2 weeks; significant for time frame.  Per notes: - AKI on stage 2 CKD - s/p syncopal episode thought to be 2/2 dehydration - UTI with indwelling Foley   Labs reviewed; most recent HgbA1c: 8.6%, CBGs: 59, 80, 69, 87, 105 mg/dl, Na: 147 mmol/l, Cl: 115 mmol/l, BUN: 28 mg/dl, creatinine: 1.85 mg/dl, Ca: 8.1 mg/dl, GFR: 27 ml/min. Medications reviewed; 1 mg folvite/day, sliding scale novolog, 40 mg oral protonix BID, 40 mEq Klor-Con x1 dose 9/30, 250 mg florastor/day, 1 tablet senokto/day, 1 g carafate TID.  IVF; 1/2 NS @ 60 ml/hr.     NUTRITION - FOCUSED PHYSICAL  EXAM:  unable to do at this time.   Diet Order:   Diet Order            Diet Heart Room service appropriate? Yes; Fluid consistency: Thin  Diet effective now                 EDUCATION NEEDS:   No education needs have been identified at this time  Skin:  Skin Assessment: Skin Integrity Issues: Skin Integrity Issues:: Stage II, Other (Comment) Stage II: sacrum Other: MASD to coccyx  Last BM:  9/30  Height:   Ht Readings from Last 1 Encounters:  02/15/20 5\' 4"  (1.626 m)    Weight:   Wt Readings from Last 1 Encounters:  02/15/20 67 kg     Estimated Nutritional Needs:  Kcal:  1600-1800 kcal Protein:  65-75 grams Fluid:  >/= 2.1 L/day     Jarome Matin, MS, RD, LDN, CNSC Inpatient Clinical Dietitian RD pager # available in AMION  After hours/weekend pager # available in [facility]

## 2020-02-17 DIAGNOSIS — E162 Hypoglycemia, unspecified: Secondary | ICD-10-CM

## 2020-02-17 LAB — CBC
HCT: 31 % — ABNORMAL LOW (ref 36.0–46.0)
Hemoglobin: 9.9 g/dL — ABNORMAL LOW (ref 12.0–15.0)
MCH: 27.9 pg (ref 26.0–34.0)
MCHC: 31.9 g/dL (ref 30.0–36.0)
MCV: 87.3 fL (ref 80.0–100.0)
Platelets: 141 10*3/uL — ABNORMAL LOW (ref 150–400)
RBC: 3.55 MIL/uL — ABNORMAL LOW (ref 3.87–5.11)
RDW: 16.3 % — ABNORMAL HIGH (ref 11.5–15.5)
WBC: 4.2 10*3/uL (ref 4.0–10.5)
nRBC: 0 % (ref 0.0–0.2)

## 2020-02-17 LAB — RENAL FUNCTION PANEL
Albumin: 2.6 g/dL — ABNORMAL LOW (ref 3.5–5.0)
Anion gap: 7 (ref 5–15)
BUN: 20 mg/dL (ref 8–23)
CO2: 24 mmol/L (ref 22–32)
Calcium: 8.2 mg/dL — ABNORMAL LOW (ref 8.9–10.3)
Chloride: 112 mmol/L — ABNORMAL HIGH (ref 98–111)
Creatinine, Ser: 1.44 mg/dL — ABNORMAL HIGH (ref 0.44–1.00)
GFR calc Af Amer: 42 mL/min — ABNORMAL LOW (ref >60)
GFR calc non Af Amer: 36 mL/min — ABNORMAL LOW (ref >60)
Glucose, Bld: 86 mg/dL (ref 70–99)
Phosphorus: 2.7 mg/dL (ref 2.5–4.6)
Potassium: 3.9 mmol/L (ref 3.5–5.1)
Sodium: 143 mmol/L (ref 135–145)

## 2020-02-17 LAB — GLUCOSE, CAPILLARY
Glucose-Capillary: 59 mg/dL — ABNORMAL LOW (ref 70–99)
Glucose-Capillary: 76 mg/dL (ref 70–99)
Glucose-Capillary: 80 mg/dL (ref 70–99)
Glucose-Capillary: 103 mg/dL — ABNORMAL HIGH (ref 70–99)
Glucose-Capillary: 139 mg/dL — ABNORMAL HIGH (ref 70–99)

## 2020-02-17 LAB — MAGNESIUM: Magnesium: 1.4 mg/dL — ABNORMAL LOW (ref 1.7–2.4)

## 2020-02-17 MED ORDER — SODIUM CHLORIDE 0.9 % IV SOLN
1.0000 g | INTRAVENOUS | Status: AC
  Administered 2020-02-17 – 2020-02-18 (×2): 1 g via INTRAVENOUS
  Filled 2020-02-17 (×2): qty 1

## 2020-02-17 MED ORDER — MAGNESIUM SULFATE 2 GM/50ML IV SOLN
2.0000 g | Freq: Once | INTRAVENOUS | Status: AC
  Administered 2020-02-17: 2 g via INTRAVENOUS
  Filled 2020-02-17: qty 50

## 2020-02-17 NOTE — Evaluation (Signed)
Clinical/Bedside Swallow Evaluation Patient Details  Name: Chloe Harding MRN: [id number] Date of Birth: 1946-02-05  Today's Date: 02/17/2020 Time: SLP Start Time (ACUTE ONLY): 1430 SLP Stop Time (ACUTE ONLY): 1441 SLP Time Calculation (min) (ACUTE ONLY): 11 min  Past Medical History:  Past Medical History:  Diagnosis Date  . Arthritis   . Depression   . Diabetes mellitus   . Hiatal hernia   . Hypercholesteremia   . Hypertension   . Reflux   . Renal disorder    shutting down 4 years ago   Past Surgical History:  Past Surgical History:  Procedure Laterality Date  . BALLOON DILATION N/A 01/25/2020   Procedure: BALLOON DILATION;  Surgeon: Carol Ada, MD;  Location: [facility];  Service: Endoscopy;  Laterality: N/A;  . CHOLECYSTECTOMY    . COLONOSCOPY WITH PROPOFOL N/A 05/20/2017   Procedure: COLONOSCOPY WITH PROPOFOL;  Surgeon: Carol Ada, MD;  Location: [place];  Service: Endoscopy;  Laterality: N/A;  . ESOPHAGOGASTRODUODENOSCOPY N/A 05/19/2017   Procedure: ESOPHAGOGASTRODUODENOSCOPY (EGD);  Surgeon: Juanita Craver, MD;  Location: [facility];  Service: Endoscopy;  Laterality: N/A;  . ESOPHAGOGASTRODUODENOSCOPY (EGD) WITH PROPOFOL N/A 01/17/2019   Procedure: ESOPHAGOGASTRODUODENOSCOPY (EGD) WITH PROPOFOL;  Surgeon: Carol Ada, MD;  Location: [facility];  Service: Endoscopy;  Laterality: N/A;  . ESOPHAGOGASTRODUODENOSCOPY (EGD) WITH PROPOFOL N/A 01/25/2020   Procedure: ESOPHAGOGASTRODUODENOSCOPY (EGD) WITH PROPOFOL;  Surgeon: Carol Ada, MD;  Location: [facility];  Service: Endoscopy;  Laterality: N/A;  . SAVORY DILATION N/A 01/17/2019   Procedure: SAVORY DILATION;  Surgeon: Carol Ada, MD;  Location: [facility];  Service: Endoscopy;  Laterality: N/A;   HPI:  74 year old with history of DM2, HTN, HLD, CAD, CHF, CKD stage IIIa presents with generalized weakness and multiple falls at home. Pt previously admitted for CHF exacerbation/pneumonia and heme positive stools.  Intermittent behavioral disturbances at home.  Upon admission she was febrile, acute kidney injury, hyponatremia, metabolic encephalopathy. Pt with hx of a primary esophageal dysphagia. Underwent EGD 9/9 revealing esophageal stenosis (she was dilated), esophagitis with ulcers.    Assessment / Plan / Recommendation Clinical Impression  Pt presents with functional swallowing - she participated in limited assessment but was willing to eat crackers and drink thin liquids.  There was normal mastication, brisk swallow, no overt s/s of aspiration,no esophageal complaints.  Pt reported improved swallowing with no further issues after last dilatation.  There were no concerns identified today that would require SLP f/u.  recommend continuing regular solids, thin liquids. Our service will sign off. SLP Visit Diagnosis: Dysphagia, unspecified (R13.10)    Aspiration Risk  No limitations    Diet Recommendation   regular solids, thin liquids  Medication Administration: Whole meds with liquid    Other  Recommendations Oral Care Recommendations: Oral care BID   Follow up Recommendations None      Frequency and Duration            Prognosis        Swallow Study   General Date of Onset: 02/15/20 HPI: 74 year old with history of DM2, HTN, HLD, CAD, CHF, CKD stage IIIa presents with generalized weakness and multiple falls at home. Pt previously admitted for CHF exacerbation/pneumonia and heme positive stools. Intermittent behavioral disturbances at home.  Upon admission she was febrile, acute kidney injury, hyponatremia, metabolic encephalopathy. Pt with hx of a primary esophageal dysphagia. [facility] EGD 9/9 revealing esophageal stenosis (she was dilated), esophagitis with ulcers.  Type of Study: Bedside Swallow Evaluation Previous Swallow Assessment: BSE 11/08/19 and  01/20/20= reg/thin recommended Diet Prior to this Study: Regular;Thin liquids Temperature Spikes Noted: No Respiratory Status: Room  air History of Recent Intubation: No Behavior/Cognition: Alert;Requires cueing Oral Cavity Assessment: Within Functional Limits Oral Care Completed by SLP: No Oral Cavity - Dentition: Poor condition Vision: Functional for self-feeding Self-Feeding Abilities: Able to feed self Patient Positioning: Upright in bed Baseline Vocal Quality: Low vocal intensity Volitional Cough: Strong Volitional Swallow: Able to elicit    Oral/Motor/Sensory Function Overall Oral Motor/Sensory Function: Within functional limits   Ice Chips Ice chips: Within functional limits   Thin Liquid Thin Liquid: Within functional limits    Nectar Thick Nectar Thick Liquid: Not tested   Honey Thick Honey Thick Liquid: Not tested   Puree Puree: Within functional limits   Solid     Solid: Within functional limits      Juan Quam Laurice 02/17/2020,2:46 PM  Estill Bamberg L. Tivis Ringer, [place] Office number [phone] Pager [phone]

## 2020-02-17 NOTE — Evaluation (Signed)
Occupational Therapy Evaluation Patient Details Name: Chloe Harding MRN: [id number] DOB: [birth year] Today's Date: 02/17/2020    History of Present Illness 74 year old with history of DM2, HTN, HLD, CAD, CHF, CKD stage IIIa presents with generalized weakness and multiple falls at home. Pt previously admitted for CHF exacerbation/pneumonia and heme positive stools. Intermittent behavioral disturbances at home.  Upon admission she was febrile, acute kidney injury, hyponatremia, metabolic encephalopathy.   Clinical Impression   Pt. Was agreeable to OT and was agreeable to getting OOB. Once ADLs were performed she refused to get OOB and stated she did not sleep good the night before. Pt. Requires encouragement to participate and cues to perform task. Pt. To be followed by OT>    Follow Up Recommendations  Home health OT;Supervision/Assistance - 24 hour    Equipment Recommendations       Recommendations for Other Services       Precautions / Restrictions Precautions Precautions: Fall Restrictions Weight Bearing Restrictions: No      Mobility Bed Mobility   Bed Mobility: Supine to Sit     Supine to sit: Min assist;HOB elevated Sit to supine: Min assist      Transfers Overall transfer level: Needs assistance Equipment used: Rolling walker (2 wheeled)   Sit to Stand: Min assist         General transfer comment: Pt. refused to sit in the chair.     Balance     Sitting balance-Leahy Scale: Fair       Standing balance-Leahy Scale: Poor                             ADL either performed or assessed with clinical judgement   ADL Overall ADL's : Needs assistance/impaired Eating/Feeding: Minimal assistance (needs assist to increase po intake)   Grooming: Oral care;Wash/dry face;Brushing hair;Minimal assistance   Upper Body Bathing: Minimal assistance;Sitting   Lower Body Bathing: Maximal assistance   Upper Body Dressing : Moderate assistance;Sitting    Lower Body Dressing: Total assistance;Sit to/from Archivist:  (min a with sit to stand refused to transfer)           Functional mobility during ADLs: Minimal assistance (Pt. refused OOB) General ADL Comments: Pt. is limited by cognition for ADLs and requires encouragaement to participate.      Vision Baseline Vision/History: No visual deficits Patient Visual Report:  (hx of cataract sx)       Perception     Praxis      Pertinent Vitals/Pain Pain Assessment: 0-10 Pain Score: 3  Pain Location: stomach Pain Descriptors / Indicators: Sore;Aching Pain Intervention(s): Monitored during session (Pt. stated she did not need pain medicine)     Hand Dominance Right   Extremity/Trunk Assessment Upper Extremity Assessment Upper Extremity Assessment: Generalized weakness   Lower Extremity Assessment Lower Extremity Assessment: Defer to PT evaluation       Communication Communication Communication: No difficulties   Cognition Arousal/Alertness: Awake/alert Behavior During Therapy: Flat affect Overall Cognitive Status: No family/caregiver present to determine baseline cognitive functioning Area of Impairment: Memory;Orientation;Safety/judgement                 Orientation Level: Time;Situation Current Attention Level: Sustained   Following Commands: Follows one step commands consistently     Problem Solving: Requires verbal cues;Requires tactile cues;Slow processing;Decreased initiation General Comments: unsure of pt. baseleine. pt. does not appear to be an accurite historian   General  Comments       Exercises     Shoulder Instructions      Home Living Family/patient expects to be discharged to:: Private residence Living Arrangements: Spouse/significant other;Other relatives Available Help at Discharge: Available PRN/intermittently Type of Home: House Home Access: Stairs to enter Entrance Stairs-Number of Steps: 10 in front, ramp in the  back Entrance Stairs-Rails: Right Home Layout: One level     Bathroom Shower/Tub: Occupational psychologist: Handicapped height Bathroom Accessibility: Yes How Accessible: Accessible via walker Home Equipment: Shower seat - built in;Grab bars - tub/shower;Grab bars - toilet;Walker - 2 wheels;Cane - single point   Additional Comments: has a lift chair      Prior Functioning/Environment Level of Independence: Independent with assistive device(s);Needs assistance  Gait / Transfers Assistance Needed: amb with cane or RW at baseline, assist for bed mobility per pt dtr, occasional assist for transfers (especially first thing in the morning) ADL's / Homemaking Assistance Needed: unknown. pt. is not an accuratie historian.             OT Problem List: Decreased activity tolerance;Decreased strength;Impaired balance (sitting and/or standing);Decreased cognition;Decreased safety awareness      OT Treatment/Interventions: Self-care/ADL training;DME and/or AE instruction;Therapeutic activities;Patient/family education;Balance training    OT Goals(Current goals can be found in the care plan section) Acute Rehab OT Goals Patient Stated Goal: none stated Time For Goal Achievement: 03/02/20 Potential to Achieve Goals: Fair ADL Goals Pt Will Perform Grooming: with supervision;standing Pt Will Perform Upper Body Bathing: with supervision;sitting Pt Will Perform Lower Body Bathing: with supervision;sit to/from stand Pt Will Perform Upper Body Dressing: with supervision;sitting Pt Will Perform Lower Body Dressing: with supervision;sit to/from stand Pt Will Transfer to Toilet: with supervision;ambulating Pt Will Perform Toileting - Clothing Manipulation and hygiene: with supervision;sit to/from stand  OT Frequency: Min 2X/week   Barriers to D/C:            Co-evaluation              AM-PAC OT "6 Clicks" Daily Activity     Outcome Measure Help from another person eating  meals?: A Little Help from another person taking care of personal grooming?: A Little Help from another person toileting, which includes using toliet, bedpan, or urinal?: A Lot Help from another person bathing (including washing, rinsing, drying)?: A Lot Help from another person to put on and taking off regular upper body clothing?: A Lot Help from another person to put on and taking off regular lower body clothing?: Total 6 Click Score: 13   End of Session Equipment Utilized During Treatment: Gait belt Nurse Communication:  (ok therapy)  Activity Tolerance: Patient limited by fatigue Patient left: in bed;with call bell/phone within reach;with bed alarm set  OT Visit Diagnosis: Unsteadiness on feet (R26.81);Repeated falls (R29.6)                Time: 4562-5638 OT Time Calculation (min): 33 min Charges:  OT General Charges $OT Visit: 1 Visit OT Evaluation $OT Eval Low Complexity: 1 Low OT Treatments $Self Care/Home Management : 8-22 mins  Reece Packer OT/L     Chloe Harding 02/17/2020, 10:21 AM

## 2020-02-17 NOTE — Progress Notes (Signed)
Hypoglycemic Event  CBG: 59  Treatment: 4 oz juice/soda  Symptoms: None  Follow-up CBG: [id number] CBG Result:76  Possible Reasons for Event: Unknown  Comments/MD notified:hypoglycemic protocol     Illa Level

## 2020-02-17 NOTE — Progress Notes (Signed)
PROGRESS NOTE  Chloe Harding ZHY:[id number] DOB: December 02, 1945   PCP: [name], NP  Patient is from: SNF  DOA: 02/15/2020 LOS: 2  Brief Narrative / Interim history: 74 year old female with history of RA, DM-2, combined CHF, HTN, GERD, urinary retention with Foley, dysphagia and esophageal stricture s/p recent dilation, and recent hospitalization from 9/2-9/10 for AME presenting from SNF with agitation, AKI and fall. Patient was diagnosed with Klebsiella UTI at SNF and started on Levaquin 500 mg twice daily on 9/25. Foley exchanged on 9/28. She was noted to have worsening AKI with creatinine trending from 1.3-2.6 from 9/25-9/29. Her creatinine was 0.7 when discharged from the hospital on 9/10.  In ED, she was confused and encephalopathic. Tachycardic to 111. RR 25. Afebrile. WBC 4.5. K3.2. Hgb 10.5. Platelet 209. CK 40. Na 144. K3.2. Bicarb 19. Cr 2.53. BUN 37. COVID-19 and influenza PCR negative. UA with large leukocytes, 5 ketones, rare bacteria. CT head and cervical spine without acute finding but noted left thyroid nodule (not new). CT renal study without significant finding but atelectasis and some signs of reflux. DG pelvis without acute finding. Urine cultures obtained. Patient was started on IV ceftriaxone and IV fluid, and admitted for encephalopathy, UTI and AKI.  Encephalopathy and AKI improved.  Urine culture negative.  Subjective: Seen and examined earlier this morning.  Was hypoglycemic to 59 earlier this morning.  Hypoglycemia improved with orange juice.  AKI improved as well.  She has no complaints but not a reliable historian.  She is only oriented to self today.  Objective: Vitals:   02/16/20 1219 02/16/20 2007 02/17/20 0500 02/17/20 1133  BP: (!) 106/54 109/60  101/60  Pulse: 81 88 98 89  Resp: _0 Temp: 99.1 F (37.3 C) 98.4 F (36.9 C) 98.3 F (36.8 C) 97.8 F (36.6 C)  TempSrc: Oral Oral Oral Oral  SpO2: 100% 100% 100% 100%  Weight:      Height:         Intake/Output Summary (Last 24 hours) at 02/17/2020 1457 Last data filed at 02/17/2020 1407 Gross per 24 hour  Intake 1611.38 ml  Output 950 ml  Net 661.38 ml   Filed Weights   02/15/20 1305  Weight: 67 kg    Examination:  GENERAL: No apparent distress.  Nontoxic. HEENT: MMM.  Vision and hearing grossly intact.  NECK: Supple.  No apparent JVD.  RESP: On room air.  No IWOB.  Fair aeration bilaterally. CVS:  RRR. Heart sounds normal.  ABD/GI/GU: BS+. Abd soft, NTND.  Foley catheter in place. MSK/EXT:  Moves extremities. No apparent deformity. No edema.  SKIN: no apparent skin lesion or wound NEURO: Awake and alert.  Only oriented to self today.  Follows commands.  No apparent focal neuro deficit. PSYCH: Calm. Normal affect.  Procedures:  None  Microbiology summarized: COVID-19 PCR negative. Influenza PCR negative. Urine culture NGTD  Assessment & Plan: Severe sepsis due to catheter associated UTI-POA. Patient met severe sepsis criteria with tachycardia, tachypnea, AKI and encephalopathy on presentation. Source of infection likely catheter associated UTI. Sepsis physiology resolved. Urine culture at SNF with Klebsiella pneumonia. She was started on Levaquin outpatient.  Urine culture here NGTD. -Avoid Quinolones in elderly patient. -Continue IV ceftriaxone empirically for 2 more days -Voiding trial today.  Acute urinary retention-patient discharged with Foley catheter on 9/10 due to urinary retention.  Foley exchange at SNF on 9/28. -Voiding trial. -Continue bethanechol  AKI/azotemia: Baseline Cr 0.7 > 2.50 (admit) > 1.85>  1.44. Likely combination of prerenal from dehydration and ATN from sepsis and diuretics. Improving. - continue 0.45 NaCl at 60 cc an hour -Hold nephrotoxic/diuretics -Continue monitoring  Acute metabolic encephalopathy-likely due to sepsis. Quinolones can contribute. Encephalopathy improved from what has been reported in H&P. She is awake and  oriented to self.  -Reorientation and delirium precautions -Avoid sedating medications  Fall at nursing home/possible syncope: Likely due to the above. CT head, cervical spine and pelvic x-ray without acute finding. CK within normal. EKG with QTC to 537. No events on telemetry. -IV fluid as above. -Check orthostatic vitals -Optimize electrolytes-replenish magnesium -Fall precaution -PT/OT eval  Chronic combined CHF: Echo in 10/2019 with EF of 45 to 50%, G1-DD, global hypokinesis. On p.o. Lasix and metoprolol tartrate at home. Appears euvolemic.  No respiratory symptoms. -Continue holding home Lasix -Monitor fluid status -Change Lopressor to Toprol on discharge.  Dysphagia/esophageal stricture-underwent EGD and esophageal dilation on 9/9 previous hospitalization. At that time, GI recommendation was PPI twice daily indefinitely and Carafate for 1 months. -Protonix 40 mg twice daily -Continue Carafate through 10/9.  Hypoglycemia: CBG down to 59 earlier this morning without insulin.  Likely due to poor p.o. intake -Monitor CBG -Encourage p.o. intake.  Chronic blood loss anemia/iron deficiency anemia: Hgb 10.5 (admit)>>> 9.9. Baseline 9-10. Had a positive Hemoccult recent hospitalization. GI evaluation recommendation as above. -Continue monitoring -Recheck anemia panel in the morning  History of subdural hematoma: Heart MRI that showed 2 mm small right occipital subdural hematoma previous hospitalization earlier last month. Repeat CT did not show SDH at that time. EEG did not show seizure. She was discharged on prophylactic Keppra 500 mg twice daily.  -Discussed with neurology, Dr. Theda Sers who recommended continuing Keppra at least for 1 year  Hypernatremia: 147> 143: Resolved. -Continue 0.45 NaCl at 60 cc an hour -Recheck in the morning  Hypokalemia: Resolved.  Non-anion gap metabolic acidosis: Resolved. -Continue monitoring  Hypothyroidism -Continue home Synthroid  Prolonged  QTc: 537 on admit. -Avoid QT prolonging drugs -Monitor and optimize K and Mg  History of rheumatoid arthritis: On Plaquenil at home. -Continue home Plaquenil  Thrombocytopenia: Platelet dropped but is stable now. -Monitor  GOC: Discussed CODE STATUS with patient's husband and daughter.  They confirm DNR/DNI which is appropriate.  Body mass index is 25.35 kg/m. Nutrition Problem: Increased nutrient needs Etiology: acute illness Signs/Symptoms: estimated needs Interventions: Ensure Enlive (each supplement provides 350kcal and 20 grams of protein), Magic cup, MVI   DVT prophylaxis:  heparin injection 5,000 Units Start: 02/15/20 2200  Code Status: DNR/DNI-Confirmed with husband and daughter Family Communication: Updated patient's daughter over the phone. Status is: Inpatient  Remains inpatient appropriate because:Persistent severe electrolyte disturbances, Altered mental status, IV treatments appropriate due to intensity of illness or inability to take PO and Inpatient level of care appropriate due to severity of illness   Dispo: The patient is from: SNF              Anticipated d/c is to: Home              Anticipated d/c date is: 1 day              Patient currently is not medically stable to d/c.       Consultants:  Neurology over the phone about [place]:  Scheduled Meds: . atorvastatin  20 mg Oral Daily  . bethanechol  5 mg Oral TID  . Chlorhexidine Gluconate Cloth  6 each Topical  Daily  . escitalopram  10 mg Oral Daily  . feeding supplement (ENSURE ENLIVE)  237 mL Oral TID BM  . folic acid  1 mg Oral Daily  . heparin  5,000 Units Subcutaneous Q12H  . metoprolol tartrate  25 mg Oral BID  . multivitamin with minerals  1 tablet Oral Daily  . pantoprazole  40 mg Oral BID  . saccharomyces boulardii  250 mg Oral Daily  . senna-docusate  1 tablet Oral QHS  . sucralfate  1 g Oral TID WC & HS   Continuous Infusions: . sodium chloride 60 mL/hr at 02/17/20  1327  . cefTRIAXone (ROCEPHIN)  IV     PRN Meds:.acetaminophen **OR** acetaminophen, ondansetron, polyethylene glycol  Antimicrobials: Anti-infectives (From admission, onward)   Start     Dose/Rate Route Frequency Ordered Stop   02/17/20 1400  cefTRIAXone (ROCEPHIN) 1 g in sodium chloride 0.9 % 100 mL IVPB        1 g 200 mL/hr over 30 Minutes Intravenous Every 24 hours 02/17/20 0757 02/19/20 1359   02/15/20 1800  cefTRIAXone (ROCEPHIN) 1 g in sodium chloride 0.9 % 100 mL IVPB        1 g 200 mL/hr over 30 Minutes Intravenous Every 24 hours 02/15/20 1755 02/16/20 1738       I have personally reviewed the following labs and images: CBC: Recent Labs  Lab 02/15/20 1315 02/16/20 0533 02/17/20 0455  WBC 7.0 4.5 4.2  NEUTROABS 3.2  --   --   HGB 10.5* 9.3* 9.9*  HCT 31.6* 29.9* 31.0*  MCV 84.3 88.7 87.3  PLT 209 139* 141*   BMP &GFR Recent Labs  Lab 02/15/20 1315 02/16/20 0533 02/17/20 0455  NA 144 147* 143  K 3.2* 3.8 3.9  CL 106 115* 112*  CO2 23 19* 24  GLUCOSE 72 77 86  BUN 37* 28* 20  CREATININE 2.53* 1.85* 1.44*  CALCIUM 9.1 8.1* 8.2*  MG  --   --  1.4*  PHOS  --   --  2.7   Estimated Creatinine Clearance: 33.2 mL/min (A) (by C-G formula based on SCr of 1.44 mg/dL (H)). Liver & Pancreas: Recent Labs  Lab 02/15/20 1315 02/17/20 0455  AST 28  --   ALT 12  --   ALKPHOS 61  --   BILITOT 1.1  --   PROT 6.5  --   ALBUMIN 3.1* 2.6*   No results for input(s): LIPASE, AMYLASE in the last 168 hours. No results for input(s): AMMONIA in the last 168 hours. Diabetic: No results for input(s): HGBA1C in the last 72 hours. Recent Labs  Lab 02/16/20 1720 02/16/20 2106 02/17/20 0723 02/17/20 0849 02/17/20 1135  GLUCAP 107* 73 59* 76 80   Cardiac Enzymes: Recent Labs  Lab 02/15/20 1315  CKTOTAL 40   No results for input(s): PROBNP in the last 8760 hours. Coagulation Profile: No results for input(s): INR, PROTIME in the last 168 hours. Thyroid Function  Tests: No results for input(s): TSH, T4TOTAL, FREET4, T3FREE, THYROIDAB in the last 72 hours. Lipid Profile: No results for input(s): CHOL, HDL, LDLCALC, TRIG, CHOLHDL, LDLDIRECT in the last 72 hours. Anemia Panel: No results for input(s): VITAMINB12, FOLATE, FERRITIN, TIBC, IRON, RETICCTPCT in the last 72 hours. Urine analysis:    Component Value Date/Time   COLORURINE YELLOW 02/15/2020 1325   APPEARANCEUR HAZY (A) 02/15/2020 1325   LABSPEC 1.015 02/15/2020 1325   PHURINE 5.0 02/15/2020 1325   GLUCOSEU NEGATIVE 02/15/2020 1325  HGBUR SMALL (A) 02/15/2020 1325   HGBUR trace-intact 05/14/2010 1402   BILIRUBINUR NEGATIVE 02/15/2020 1325   KETONESUR 5 (A) 02/15/2020 1325   PROTEINUR 30 (A) 02/15/2020 1325   UROBILINOGEN 1.0 03/30/2015 1540   NITRITE NEGATIVE 02/15/2020 1325   LEUKOCYTESUR LARGE (A) 02/15/2020 1325   Sepsis Labs: Invalid input(s): PROCALCITONIN, [place]  Microbiology: Recent Results (from the past 240 hour(s))  Urine culture     Status: None   Collection Time: 02/15/20  1:25 PM   Specimen: Urine, Clean Catch  Result Value Ref Range Status   Specimen Description   Final    URINE, CLEAN CATCH Performed at [facility], [place] [address] [place], [place] [address]    Special Requests   Final    NONE Performed at [facility], [place] [address], [place], [place] [address]    Culture   Final    NO GROWTH Performed at [facility], [place] [address], [place], [place] [address]    Report Status 02/16/2020 FINAL  Final  Respiratory Panel by RT PCR (Flu A&B, Covid) - Nasopharyngeal Swab     Status: None   Collection Time: 02/15/20  5:20 PM   Specimen: Nasopharyngeal Swab  Result Value Ref Range Status   SARS Coronavirus 2 by RT PCR NEGATIVE NEGATIVE Final    Comment: (NOTE) SARS-CoV-2 target nucleic acids are NOT DETECTED.  The SARS-CoV-2 RNA is generally detectable in upper respiratoy specimens during the acute phase  of infection. The lowest concentration of SARS-CoV-2 viral copies this assay can detect is 131 copies/mL. A negative result does not preclude SARS-Cov-2 infection and should not be used as the sole basis for treatment or other patient management decisions. A negative result may occur with  improper specimen collection/handling, submission of specimen other than nasopharyngeal swab, presence of viral mutation(s) within the areas targeted by this assay, and inadequate number of viral copies (<131 copies/mL). A negative result must be combined with clinical observations, patient history, and epidemiological information. The expected result is Negative.  Fact Sheet for Patients:  PinkCheek.be  Fact Sheet for Healthcare Providers:  GravelBags.it  This test is no t yet approved or cleared by the [place] FDA and  has been authorized for detection and/or diagnosis of SARS-CoV-2 by FDA under an Emergency Use Authorization (EUA). This EUA will remain  in effect (meaning this test can be used) for the duration of the COVID-19 declaration under Section 564(b)(1) of the Act, 21 U.S.C. section 360bbb-3(b)(1), unless the authorization is terminated or revoked sooner.     Influenza A by PCR NEGATIVE NEGATIVE Final   Influenza B by PCR NEGATIVE NEGATIVE Final    Comment: (NOTE) The Xpert Xpress SARS-CoV-2/FLU/RSV assay is intended as an aid in  the diagnosis of influenza from Nasopharyngeal swab specimens and  should not be used as a sole basis for treatment. Nasal washings and  aspirates are unacceptable for Xpert Xpress SARS-CoV-2/FLU/RSV  testing.  Fact Sheet for Patients: PinkCheek.be  Fact Sheet for Healthcare Providers: GravelBags.it  This test is not yet approved or cleared by the [place] FDA and  has been authorized for detection and/or diagnosis of  SARS-CoV-2 by  FDA under an Emergency Use Authorization (EUA). This EUA will remain  in effect (meaning this test can be used) for the duration of the  Covid-19 declaration under Section 564(b)(1) of the Act, 21  U.S.C. section 360bbb-3(b)(1), unless the authorization is  terminated or revoked. Performed at [facility]  [place] [address], [place],  [address]     Radiology Studies: No results found.   Paxson Harrower T. Chadwick  If 7PM-7AM, please contact night-coverage [email or web address] 02/17/2020, 2:57 PM

## 2020-02-18 DIAGNOSIS — R9431 Abnormal electrocardiogram [ECG] [EKG]: Secondary | ICD-10-CM

## 2020-02-18 LAB — FOLATE: Folate: 67.7 ng/mL (ref >5.9)

## 2020-02-18 LAB — IRON AND TIBC
Iron: 56 ug/dL (ref 28–170)
Saturation Ratios: 39 % — ABNORMAL HIGH (ref 10.4–31.8)
TIBC: 143 ug/dL — ABNORMAL LOW (ref 250–450)
UIBC: 87 ug/dL

## 2020-02-18 LAB — RENAL FUNCTION PANEL
Albumin: 2.6 g/dL — ABNORMAL LOW (ref 3.5–5.0)
Anion gap: 8 (ref 5–15)
BUN: 15 mg/dL (ref 8–23)
CO2: 22 mmol/L (ref 22–32)
Calcium: 8.2 mg/dL — ABNORMAL LOW (ref 8.9–10.3)
Chloride: 111 mmol/L (ref 98–111)
Creatinine, Ser: 1.1 mg/dL — ABNORMAL HIGH (ref 0.44–1.00)
GFR calc Af Amer: 58 mL/min — ABNORMAL LOW (ref >60)
GFR calc non Af Amer: 50 mL/min — ABNORMAL LOW (ref >60)
Glucose, Bld: 98 mg/dL (ref 70–99)
Phosphorus: 2.6 mg/dL (ref 2.5–4.6)
Potassium: 3.3 mmol/L — ABNORMAL LOW (ref 3.5–5.1)
Sodium: 141 mmol/L (ref 135–145)

## 2020-02-18 LAB — CBC
HCT: 30.6 % — ABNORMAL LOW (ref 36.0–46.0)
Hemoglobin: 10 g/dL — ABNORMAL LOW (ref 12.0–15.0)
MCH: 28.6 pg (ref 26.0–34.0)
MCHC: 32.7 g/dL (ref 30.0–36.0)
MCV: 87.4 fL (ref 80.0–100.0)
Platelets: 144 10*3/uL — ABNORMAL LOW (ref 150–400)
RBC: 3.5 MIL/uL — ABNORMAL LOW (ref 3.87–5.11)
RDW: 16.3 % — ABNORMAL HIGH (ref 11.5–15.5)
WBC: 4.8 10*3/uL (ref 4.0–10.5)
nRBC: 0 % (ref 0.0–0.2)

## 2020-02-18 LAB — MAGNESIUM: Magnesium: 2 mg/dL (ref 1.7–2.4)

## 2020-02-18 LAB — RETICULOCYTES
Immature Retic Fract: 8.9 % (ref 2.3–15.9)
RBC.: 3.48 MIL/uL — ABNORMAL LOW (ref 3.87–5.11)
Retic Count, Absolute: 43.8 10*3/uL (ref 19.0–186.0)
Retic Ct Pct: 1.3 % (ref 0.4–3.1)

## 2020-02-18 LAB — FERRITIN: Ferritin: 88 ng/mL (ref 11–307)

## 2020-02-18 LAB — VITAMIN B12: Vitamin B-12: 632 pg/mL (ref 180–914)

## 2020-02-18 LAB — GLUCOSE, CAPILLARY
Glucose-Capillary: 71 mg/dL (ref 70–99)
Glucose-Capillary: 90 mg/dL (ref 70–99)

## 2020-02-18 MED ORDER — POLYVINYL ALCOHOL 1.4 % OP SOLN
1.0000 [drp] | OPHTHALMIC | Status: DC | PRN
  Administered 2020-02-18 (×2): 1 [drp] via OPHTHALMIC
  Filled 2020-02-18: qty 15

## 2020-02-18 MED ORDER — BETHANECHOL CHLORIDE 10 MG PO TABS
10.0000 mg | ORAL_TABLET | Freq: Three times a day (TID) | ORAL | Status: DC
  Filled 2020-02-18 (×2): qty 1

## 2020-02-18 MED ORDER — FUROSEMIDE 40 MG PO TABS: 40.0000 mg | ORAL_TABLET | Freq: Every day | ORAL | 0 refills | Status: DC | PRN

## 2020-02-18 MED ORDER — POTASSIUM CHLORIDE CRYS ER 20 MEQ PO TBCR
40.0000 meq | EXTENDED_RELEASE_TABLET | ORAL | Status: AC
  Administered 2020-02-18 (×2): 40 meq via ORAL
  Filled 2020-02-18 (×2): qty 2

## 2020-02-18 MED ORDER — PANTOPRAZOLE SODIUM 40 MG PO TBEC: 40.0000 mg | DELAYED_RELEASE_TABLET | Freq: Two times a day (BID) | ORAL | 0 refills | Status: DC

## 2020-02-18 MED ORDER — ESCITALOPRAM OXALATE 10 MG PO TABS: 10.0000 mg | ORAL_TABLET | Freq: Every day | ORAL | 0 refills | Status: DC

## 2020-02-18 MED ORDER — HYDROXYCHLOROQUINE SULFATE 200 MG PO TABS: 200.0000 mg | ORAL_TABLET | Freq: Every day | ORAL | 0 refills | Status: DC

## 2020-02-18 MED ORDER — POTASSIUM CHLORIDE ER 10 MEQ PO TBCR: 10.0000 meq | EXTENDED_RELEASE_TABLET | Freq: Every day | ORAL | 0 refills | Status: DC

## 2020-02-18 MED ORDER — METOPROLOL SUCCINATE ER 50 MG PO TB24: 50.0000 mg | ORAL_TABLET | Freq: Every day | ORAL | 1 refills | Status: DC

## 2020-02-18 MED ORDER — BETHANECHOL CHLORIDE 25 MG PO TABS
25.0000 mg | ORAL_TABLET | Freq: Once | ORAL | Status: AC
  Administered 2020-02-18: 25 mg via ORAL
  Filled 2020-02-18: qty 1

## 2020-02-18 MED ORDER — BETHANECHOL CHLORIDE 10 MG PO TABS: 10.0000 mg | ORAL_TABLET | Freq: Three times a day (TID) | ORAL | 0 refills | Status: DC

## 2020-02-18 MED ORDER — ADULT MULTIVITAMIN W/MINERALS CH: 1.0000 | ORAL_TABLET | Freq: Every day | ORAL | Status: DC

## 2020-02-18 MED ORDER — ROPINIROLE HCL 2 MG PO TABS: 5.0000 mg | ORAL_TABLET | Freq: Every day | ORAL | 0 refills | Status: DC

## 2020-02-18 MED ORDER — ATORVASTATIN CALCIUM 20 MG PO TABS: 20.0000 mg | ORAL_TABLET | Freq: Every day | ORAL | 0 refills | Status: DC

## 2020-02-18 NOTE — Progress Notes (Signed)
AVS given to patient and explained at the bedside and over the phone with the pt's daughter. Medications and follow up appointments have been explained with the pt and the pt's daughter verbalizing understanding.

## 2020-02-18 NOTE — TOC Transition Note (Signed)
Transition of Care [facility] - CM/SW Discharge Note   Patient Details  Name: KAITLAN BIN MRN: [id number] Date of Birth: 1946-03-01  Transition of Care [facility]) CM/SW Contact:  Servando Snare, LCSW Phone Number: 02/18/2020, 10:52 AM   Clinical Narrative:       Final next level of care: [place] Barriers to Discharge: No Barriers Identified   Patient Goals and CMS Choice Patient states their goals for this hospitalization and ongoing recovery are:: To discharge home      Discharge Placement  PT to dc home with [facility] through [place]. Patient daughter requested hospital bed. Per attending this is not needed.                      Discharge Plan and Services                          HH Arranged: RN, PT, OT, Nurse's Aide, Speech Therapy HH Agency: [facility]        Social Determinants of Health (SDOH) Interventions     Readmission Risk Interventions No flowsheet data found.

## 2020-02-18 NOTE — Plan of Care (Signed)

## 2020-02-18 NOTE — Discharge Summary (Signed)
Physician Discharge Summary  Chloe Harding HRC:[id number] DOB: [birth year] DOA: 02/15/2020  PCP: [name], NP  Admit date: 02/15/2020 Discharge date: 02/18/2020  Admitted From: SNF Disposition: Home  Recommendations for Outpatient Follow-up:  1. Follow ups as below. 2. Please obtain CBC/BMP/Mag at follow up 3. Please follow up on the following pending results: None  Home Health: PT/OT Equipment/Devices: None recommended by therapy  Discharge Condition: Stable CODE STATUS: DNR/DNI   Follow-up Information    [name], NP. Schedule an appointment as soon as possible for a visit in 1 week(s).   Specialty: Nurse Practitioner Contact information: [place] [address] [phone]        [name], MD. Schedule an appointment as soon as possible for a visit in 2 week(s).   Specialty: Cardiology Contact information: [address] [place] [address] [place] [address] [phone]        [facility]. Schedule an appointment as soon as possible for a visit in 1 week(s).   Contact information: [place] [phone]               Hospital Course: 74 year old female with history of RA, DM-2, combined CHF, HTN, GERD, urinary retention with Foley, dysphagia and esophageal stricture s/p recent dilation, and recent hospitalization from 9/2-9/10 for AME presenting from SNF with agitation, AKI and fall. Patient was diagnosed with Klebsiella UTI at SNF and started on Levaquin 500 mg twice daily on 9/25. Foley exchanged on 9/28. She was noted to have worsening AKI with creatinine trending from 1.3-2.6 from 9/25-9/29. Her creatinine was 0.7 when discharged from the hospital on 9/10.  In ED, she was confused and encephalopathic. Tachycardic to 111. RR 25. Afebrile. WBC 4.5. K3.2. Hgb 10.5. Platelet 209. CK 40. Na 144. K3.2. Bicarb 19. Cr 2.53. BUN 37. COVID-19 and influenza PCR negative.  UA with large leukocytes, 5 ketones, rare bacteria. CT head and cervical spine without acute finding but noted left thyroid nodule (not new). CT renal study without significant finding but atelectasis and some signs of reflux. DG pelvis without acute finding. Urine cultures obtained. Patient was started on IV ceftriaxone and IV fluid, and admitted for encephalopathy, UTI and AKI.  Encephalopathy resolved.  AKI improved.  Creatinine down to 1.1.  Completed 4 days of IV ceftriaxone in-house.  In-house urine culture negative.  Patient failed voiding trial even after bethanechol.  Discharged with indwelling Foley catheter to follow-up with urology for voiding trial  Evaluated by therapy who recommended home health with 24-hour supervision that daughter is willing to provide.  See individual problem list below for more on hospital course.  Discharge Diagnoses:  Severe sepsis due to catheter associated UTI-POA. Patient met severe sepsis criteria with tachycardia, tachypnea, AKI and encephalopathy on presentation. Source of infection likely catheter associated UTI. Sepsis physiology resolved. Urine culture at SNF with Klebsiella pneumonia. She was started on Levaquin outpatient.  Urine culture here NGTD.  Sepsis physiology resolved. -Avoid Quinolones in elderly patient. -Completed IV ceftriaxone for 4 days.  Acute urinary retention-patient discharged with Foley catheter on 9/10 due to urinary retention.  Foley exchange at SNF on 9/28.  Attempted voiding trial with bethanechol this hospitalization but failed. -Outpatient follow-up with urology 1 to 2 weeks for voiding trial and further evaluation  AKI/azotemia: Baseline Cr 0.7 > 2.50 (admit) > 1.85> 1.44> 1.1. Likely combination of prerenal from dehydration and ATN from sepsis and diuretics. Improving. -Change Lasix to  as needed-to be started next week -Repeat renal function at follow-up.  Acute metabolic encephalopathy-likely due to sepsis.  Quinolones can contribute. Encephalopathy improved from what has been reported in H&P.  Awake, alert and oriented to self, place and family name. -Reorientation and delirium precautions  Fall at nursing home: Reportedly slid off her bed to the ground.  Likely due to the above. CT head, cervical spine and pelvic x-ray without acute finding. CK within normal. EKG with QTC to 537. No events on telemetry. -Home health PT/OT  Chronic combined CHF: Echo in 10/2019 with EF of 45 to 50%, G1-DD, global hypokinesis. On p.o. Lasix and metoprolol tartrate at home. Appears euvolemic.  No respiratory symptoms. -Change Lasix to as needed to be started in a week -Changed Lopressor to Toprol-XL -Reassess fluid status and adjust as appropriate  Dysphagia/esophageal stricture-underwent EGD and esophageal dilation on 9/9 previous hospitalization. At that time, GI recommendation was PPI twice daily indefinitely and Carafate for 1 months. -Protonix 40 mg twice daily -Discontinued Carafate.  Completed over 3 weeks.  Hypoglycemia:  Likely due to poor p.o. intake.  A1c 4.6% on 01/29/2020.  Hypoglycemia resolved.  Chronic blood loss anemia/iron deficiency anemia: Hgb 10.5 (admit)>>> 10.  At baseline. History of positive Hemoccult: High GI evaluation last months.  H&H stable. -Continue Protonix twice daily -Recheck CBC at follow-up  History of subdural hematoma: Heart MRI that showed 2 mm small right occipital subdural hematoma previous hospitalization earlier last month. Repeat CT did not show SDH at that time. EEG did not show seizure. She was discharged on prophylactic Keppra 500 mg twice daily.  -Discussed with neurology, Dr. Theda Sers who recommended continuing Keppra at least for 1 year  Hypernatremia: 147> 141: Resolved.  Hypokalemia: K3.3.  Received K-Dur 40 mEq x 3 prior to discharge.  Magnesium normal.  Non-anion gap metabolic acidosis: Resolved. -Continue monitoring  Hypothyroidism -Continue  home Synthroid  Prolonged QTc: 537 on admit. -Avoid QT prolonging drugs  History of rheumatoid arthritis: On Plaquenil at home. -Continue home Plaquenil  Thrombocytopenia: Platelet dropped  but stable now.  GOC: Discussed CODE STATUS with patient's husband and daughter.  They confirm DNR/DNI which is appropriate.  All prescriptions renewed and patient's daughter updated about discharge plan  Body mass index is 25.35 kg/m. Nutrition Problem: Increased nutrient needs Etiology: acute illness Signs/Symptoms: estimated needs Interventions: Ensure Enlive (each supplement provides 350kcal and 20 grams of protein), Magic cup, MVI      Discharge Exam: Vitals:   02/18/20 0704 02/18/20 1300  BP: 135/65 133/60  Pulse: 72 76  Resp: 16 14  Temp: 98 F (36.7 C) 98.7 F (37.1 C)  SpO2: 100% 96%    GENERAL: No apparent distress.  Nontoxic. HEENT: MMM.  Vision and hearing grossly intact.  NECK: Supple.  No apparent JVD.  RESP: On room air.  No IWOB.  Fair aeration bilaterally. CVS:  RRR. Heart sounds normal.  ABD/GI/GU: Bowel sounds present. Soft. Non tender.  MSK/EXT:  Moves extremities. No apparent deformity. No edema.  SKIN: no apparent skin lesion or wound NEURO: Awake, alert and oriented x4 except time.  No apparent focal neuro deficit. PSYCH: Calm. Normal affect.  Discharge Instructions  Discharge Instructions    Call MD for:  difficulty breathing, headache or visual disturbances   Complete by: As directed    Call MD for:  extreme fatigue   Complete by: As directed    Call MD for:  persistant dizziness or light-headedness   Complete by: As directed  Call MD for:  temperature >100.4   Complete by: As directed    Diet - low sodium heart healthy   Complete by: As directed    Discharge instructions   Complete by: As directed    It has been a pleasure taking care of you!  You were hospitalized due to altered mental status likely from urinary tract infection and  acute kidney injury.  You were treated with IV antibiotics and IV fluids.  You kidney function has improved.  We stopped some of your home medications and adjusted orders as necessary.  Please review your new medication list and the directions on your medications before you start taking.  Please follow-up with your primary care doctor in 1 to 2 weeks.   We may have started you on other new medications or made some changes to your home medications during this hospitalization. Please review your new medication list and the directions carefully before you take them.    Please go to your hospital follow-up appointments or call to reschedule as recommended.   Take care,   Increase activity slowly   Complete by: As directed    No wound care   Complete by: As directed      Allergies as of 02/18/2020      Reactions   Amlodipine Besylate Swelling   swelling      Medication List    STOP taking these medications   bisacodyl 5 MG EC tablet Commonly known as: DULCOLAX   Constulose 10 GM/15ML solution Generic drug: lactulose   glucagon 1 MG Solr injection Commonly known as: GLUCAGEN   levofloxacin 500 MG tablet Commonly known as: LEVAQUIN   metoprolol tartrate 25 MG tablet Commonly known as: LOPRESSOR   saccharomyces boulardii 250 MG capsule Commonly known as: FLORASTOR   sucralfate 1 g tablet Commonly known as: CARAFATE     TAKE these medications   acetaminophen 325 MG tablet Commonly known as: TYLENOL Take 650 mg by mouth every 6 (six) hours as needed for moderate pain.   atorvastatin 20 MG tablet Commonly known as: LIPITOR Take 1 tablet (20 mg total) by mouth daily.   escitalopram 10 MG tablet Commonly known as: LEXAPRO Take 1 tablet (10 mg total) by mouth daily.   folic acid 1 MG tablet Commonly known as: FOLVITE Take 1 tablet (1 mg total) by mouth daily.   furosemide 40 MG tablet Commonly known as: LASIX Take 1 tablet (40 mg total) by mouth daily as needed for  fluid or edema (or shortness of breath). Start taking on: February 23, 2020 What changed:   when to take this  reasons to take this  These instructions start on February 23, 2020. If you are unsure what to do until then, ask your doctor or other care provider.   hydroxychloroquine 200 MG tablet Commonly known as: PLAQUENIL Take 1 tablet (200 mg total) by mouth daily.   metoprolol succinate 50 MG 24 hr tablet Commonly known as: Toprol XL Take 1 tablet (50 mg total) by mouth daily. Take with or immediately following a meal.   multivitamin with minerals Tabs tablet Take 1 tablet by mouth daily. Start taking on: February 19, 2020   NUTRITIONAL SUPPLEMENT PO Take 1 each by mouth daily. Magic Cup What changed: Another medication with the same name was removed. Continue taking this medication, and follow the directions you see here.   ondansetron 4 MG tablet Commonly known as: ZOFRAN Take 4 mg by mouth every 8 (eight) hours  as needed for nausea or vomiting.   pantoprazole 40 MG tablet Commonly known as: Protonix Take 1 tablet (40 mg total) by mouth 2 (two) times daily.   polyethylene glycol 17 g packet Commonly known as: MIRALAX / GLYCOLAX Take 17 g by mouth daily as needed for moderate constipation or severe constipation.   potassium chloride 10 MEQ tablet Commonly known as: KLOR-CON Take 1 tablet (10 mEq total) by mouth daily.   rOPINIRole 2 MG tablet Commonly known as: REQUIP Take 2.5 tablets (5 mg total) by mouth at bedtime. What changed: medication strength   senna-docusate 8.6-50 MG tablet Commonly known as: Senokot-S Take 1 tablet by mouth at bedtime.   Vitamin D3 25 MCG tablet Commonly known as: Vitamin D Take 1 tablet (1,000 Units total) by mouth daily.       Consultations:  Neurology over the phone  Procedures/Studies:   DG Pelvis 1-2 Views  Result Date: 02/15/2020 CLINICAL DATA:  Fall EXAM: PELVIS - 1-2 VIEW COMPARISON:  05/18/2017 FINDINGS: There is  no evidence of pelvic fracture or diastasis. No pelvic bone lesions are seen. IMPRESSION: Negative. Electronically Signed   By: Franchot Gallo M.D.   On: 02/15/2020 13:36   DG Abd 1 View  Result Date: 01/21/2020 CLINICAL DATA:  Fecal impaction EXAM: ABDOMEN - 1 VIEW COMPARISON:  CT abdomen/pelvis dated 01/19/2020 FINDINGS: Nonobstructive bowel gas pattern. Normal colonic stool burden. However, there is moderate stool in the rectal vault, compatible with the clinical history of fecal impaction. Cholecystectomy clips. Degenerative changes of the lumbar spine. IMPRESSION: Moderate stool in the rectal vault, compatible with the clinical history of fecal impaction. Electronically Signed   By: Julian Hy M.D.   On: 01/21/2020 04:39   CT Head Wo Contrast  Result Date: 02/15/2020 CLINICAL DATA:  Head trauma, moderate/severe. Additional provided: Hematoma to right side of head and laceration to right arm, patient reports headache. EXAM: CT HEAD WITHOUT CONTRAST CT CERVICAL SPINE WITHOUT CONTRAST TECHNIQUE: Multidetector CT imaging of the head and cervical spine was performed following the standard protocol without intravenous contrast. Multiplanar CT image reconstructions of the cervical spine were also generated. COMPARISON:  Head CT 01/21/2020. Brain MRI 01/20/2020. CT cervical spine 12/23/2019. Thyroid ultrasound 01/15/2019 FINDINGS: CT HEAD FINDINGS Brain: Stable, mild generalized cerebral atrophy. Stable, moderate ill-defined hypoattenuation within the cerebral white matter which is nonspecific, but consistent with chronic small vessel ischemic disease. There is no acute intracranial hemorrhage. No demarcated cortical infarct. No extra-axial fluid collection. No evidence of intracranial mass. No midline shift. Vascular: No hyperdense vessel.  Atherosclerotic calcifications. Skull: Normal. Negative for fracture or focal lesion. Sinuses/Orbits: Visualized orbits show no acute finding. Left maxillary sinus  air-fluid level. Air-fluid level and frothy secretions within the right sphenoid sinus. No significant mastoid effusion. Other: Right frontoparietal scalp hematoma. CT CERVICAL SPINE FINDINGS Alignment: No significant spondylolisthesis. Skull base and vertebrae: The basion-dental and atlanto-dental intervals are maintained.No evidence of acute fracture to the cervical spine. Soft tissues and spinal canal: No prevertebral fluid or swelling. No visible canal hematoma. Disc levels: No significant bony spinal canal or neural foraminal narrowing at any level. Upper chest: No consolidation within the imaged lung apices. No visible pneumothorax. Other: Redemonstrated 3 cm right thyroid lobe nodule. IMPRESSION: CT head: 1. No evidence of acute intracranial abnormality. 2. Right frontoparietal scalp hematoma. 3. Stable mild generalized cerebral atrophy and moderate chronic small vessel ischemic disease. 4. Left maxillary and right sphenoid sinusitis. CT cervical spine: 1. No evidence of acute fracture  to the cervical spine. 2. Redemonstrated 3 cm right thyroid lobe nodule. This nodule was previously assessed with thyroid ultrasound on 01/15/2019. Please refer to this prior report for further description and recommendations. Electronically Signed   By: Kellie Simmering DO   On: 02/15/2020 15:22   CT HEAD WO CONTRAST  Result Date: 01/21/2020 CLINICAL DATA:  Altered mental status. EXAM: CT HEAD WITHOUT CONTRAST TECHNIQUE: Contiguous axial images were obtained from the base of the skull through the vertex without intravenous contrast. COMPARISON:  January 18, 2020. FINDINGS: Brain: Mild diffuse cortical atrophy is noted. Mild chronic ischemic white matter disease is noted. No mass effect or midline shift is noted. Ventricular size is within normal limits. There is no evidence of mass lesion, hemorrhage or acute infarction. Vascular: No hyperdense vessel or unexpected calcification. Skull: Normal. Negative for fracture or focal  lesion. Sinuses/Orbits: No acute finding. Other: None. IMPRESSION: Mild diffuse cortical atrophy. Mild chronic ischemic white matter disease. No acute intracranial abnormality seen. Electronically Signed   By: Marijo Conception M.D.   On: 01/21/2020 09:58   CT Cervical Spine Wo Contrast  Result Date: 02/15/2020 CLINICAL DATA:  Head trauma, moderate/severe. Additional provided: Hematoma to right side of head and laceration to right arm, patient reports headache. EXAM: CT HEAD WITHOUT CONTRAST CT CERVICAL SPINE WITHOUT CONTRAST TECHNIQUE: Multidetector CT imaging of the head and cervical spine was performed following the standard protocol without intravenous contrast. Multiplanar CT image reconstructions of the cervical spine were also generated. COMPARISON:  Head CT 01/21/2020. Brain MRI 01/20/2020. CT cervical spine 12/23/2019. Thyroid ultrasound 01/15/2019 FINDINGS: CT HEAD FINDINGS Brain: Stable, mild generalized cerebral atrophy. Stable, moderate ill-defined hypoattenuation within the cerebral white matter which is nonspecific, but consistent with chronic small vessel ischemic disease. There is no acute intracranial hemorrhage. No demarcated cortical infarct. No extra-axial fluid collection. No evidence of intracranial mass. No midline shift. Vascular: No hyperdense vessel.  Atherosclerotic calcifications. Skull: Normal. Negative for fracture or focal lesion. Sinuses/Orbits: Visualized orbits show no acute finding. Left maxillary sinus air-fluid level. Air-fluid level and frothy secretions within the right sphenoid sinus. No significant mastoid effusion. Other: Right frontoparietal scalp hematoma. CT CERVICAL SPINE FINDINGS Alignment: No significant spondylolisthesis. Skull base and vertebrae: The basion-dental and atlanto-dental intervals are maintained.No evidence of acute fracture to the cervical spine. Soft tissues and spinal canal: No prevertebral fluid or swelling. No visible canal hematoma. Disc levels:  No significant bony spinal canal or neural foraminal narrowing at any level. Upper chest: No consolidation within the imaged lung apices. No visible pneumothorax. Other: Redemonstrated 3 cm right thyroid lobe nodule. IMPRESSION: CT head: 1. No evidence of acute intracranial abnormality. 2. Right frontoparietal scalp hematoma. 3. Stable mild generalized cerebral atrophy and moderate chronic small vessel ischemic disease. 4. Left maxillary and right sphenoid sinusitis. CT cervical spine: 1. No evidence of acute fracture to the cervical spine. 2. Redemonstrated 3 cm right thyroid lobe nodule. This nodule was previously assessed with thyroid ultrasound on 01/15/2019. Please refer to this prior report for further description and recommendations. Electronically Signed   By: Kellie Simmering DO   On: 02/15/2020 15:22   MR BRAIN WO CONTRAST  Result Date: 01/20/2020 CLINICAL DATA:  Mental status change with unknown cause. Multiple recent falls. EXAM: MRI HEAD WITHOUT CONTRAST TECHNIQUE: Multiplanar, multiecho pulse sequences of the brain and surrounding structures were obtained without intravenous contrast. COMPARISON:  This head CT from 2 days ago FINDINGS: Brain: Trace subdural hemorrhage along the left cerebral convexity,  mainly inferiorly, only 2 mm in maximal thickness. A trace right occipital subdural collection could also be present. Head CT follow-up in 24 hours could be considered. No acute infarct, hydrocephalus, or masslike finding. Moderate chronic small vessel ischemia in the periventricular white matter and pons. Vascular: Normal flow voids. Skull and upper cervical spine: Normal marrow signal Sinuses/Orbits: No acute finding.  Bilateral cataract resection Other: These results were called by telephone at the time of interpretation on 01/20/2020 at 12:45 pm to provider [facility] , who verbally acknowledged these results. IMPRESSION: 1. Thin subdural hematoma along the left cerebral convexity (only 2 mm in  thickness). This was not seen on head CT from 2 days ago but may have been too thin to detect by CT. No mass effect. 2. Probable even thinner subdural hematoma along the right occipital convexity. 3. No acute infarct. 4. Moderate motion artifact. Electronically Signed   By: Monte Fantasia M.D.   On: 01/20/2020 12:53   DG Chest Portable 1 View  Result Date: 02/15/2020 CLINICAL DATA:  Fall. EXAM: PORTABLE CHEST 1 VIEW COMPARISON:  01/18/2020. FINDINGS: Mediastinum and hilar structures normal. Heart size normal. Lung volumes. Mild bibasilar atelectasis. Degenerative changes scoliosis thoracic spine. IMPRESSION: Low lung volumes with mild bibasilar atelectasis. Electronically Signed   By: Marcello Moores  Register   On: 02/15/2020 13:39   EEG adult  Result Date: 01/22/2020 Lora Havens, MD     01/22/2020  3:59 PM Patient Name: MAHOGANI HOLOHAN MRN: [id number] Epilepsy Attending: Lora Havens Referring Physician/Provider: Dr Donnetta Simpers Date: 01/22/2020 Duration: 24.45 mins Patient history: 74 y.o. female with PMH significant for diabetes mellitus, CHF, chronic anemia, hyperlipidemia, hypertension  who presents with AMS and multiple falls. CTH showed no acute intracranial abnormality. MR Brain showed trace subdural hemorrhage along the left cerebral convexity, mainly inferiorly, only 2 mm in maximal thickness. A trace right occipital subdural collection could also be present. EEG to evaluate for seizure.  Level of alertness: Awake AEDs during EEG study: LEV Technical aspects: This EEG study was done with scalp electrodes positioned according to the 10-20 International system of electrode placement. Electrical activity was acquired at a sampling rate of 500Hz  and reviewed with a high frequency filter of 70Hz  and a low frequency filter of 1Hz . EEG data were recorded continuously and digitally stored. Description: No posterior dominant rhythm was seen. EEG showed continuous generalized 3 to 6 Hz theta-delta slowing.  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 Hyperventilation and photic stimulation were not performed.   ABNORMALITY -Continuous slow, generalized -Periodic epileptiform discharges with triphasic morphology , generalized IMPRESSION: This study is suggestive of moderate to severe diffuse encephalopathy, nonspecific etiology but likely related to toxic-metabolic etiology. 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  No seizures were seen throughout the recording. Lora Havens   CT Renal Stone Study  Result Date: 02/15/2020 CLINICAL DATA:  Flank pain, kidney stone suspected Patient presents to the emergency room for headache after fall. Patient was at rehab for a fall 3 weeks ago that had resulted in a kidney injury. EXAM: CT ABDOMEN AND PELVIS WITHOUT CONTRAST TECHNIQUE: Multidetector CT imaging of the abdomen and pelvis was performed following the standard protocol without IV contrast. COMPARISON:  Noncontrast CT 01/19/2020 FINDINGS: Lower chest: Elevated right hemidiaphragm with compressive atelectasis at the right lung base and air bronchograms. Previous pleural effusions have  resolved. Coronary artery and mitral annulus calcifications. Patulous esophagus with fluid level. Decreased density of blood pool suggesting anemia. Hepatobiliary: Focal fatty infiltration adjacent to the falciform ligament. No suspicious focal hepatic lesion. Mild background hepatic steatosis. Clips in the gallbladder fossa postcholecystectomy. No biliary dilatation. Pancreas: Parenchymal atrophy. No ductal dilatation or inflammation. Spleen: Normal in size without focal abnormality. Adrenals/Urinary Tract: Normal adrenal glands. Mild bilateral renal parenchymal thinning without hydronephrosis or focal renal abnormality. Mild symmetric perinephric edema appears  similar to prior exam and is likely chronic. No renal calculi. Both ureters are decompressed without stones along the course. Foley catheter decompresses the urinary bladder. Stomach/Bowel: Patulous distal esophagus with air-fluid level and small to moderate hiatal hernia. There is no gastric wall thickening. Surgical clips adjacent to the pylorus. Small duodenal diverticulum without acute inflammatory change. There is no small bowel obstruction or inflammation. High-riding cecum in the right upper quadrant, unchanged from prior exam. Appendix not visualized. Diminished stool burden from prior exam. Diverticulosis of the sigmoid colon without diverticulitis. No bowel inflammation. Vascular/Lymphatic: Aorto bi-iliac atherosclerosis. No aortic aneurysm. There is no bulky abdominopelvic adenopathy. Reproductive: Uterus and bilateral adnexa are unremarkable. Other: Improved presacral edema from prior. No ascites. There is no free air. No abdominal wall hernia. Musculoskeletal: Degenerative change in the spine with Modic endplate changes at V7-S8. No acute fracture. The bones are diffusely under mineralized. IMPRESSION: 1. No renal stones or obstructive uropathy. 2. Elevated right hemidiaphragm with compressive atelectasis at the right lung base. Previous pleural effusions have resolved. 3. Patulous distal esophagus with air-fluid level, suggesting reflux or dysmotility. Small to moderate hiatal hernia. 4. Colonic diverticulosis without diverticulitis. Aortic Atherosclerosis (ICD10-I70.0). Electronically Signed   By: Keith Rake M.D.   On: 02/15/2020 15:24       The results of significant diagnostics from this hospitalization (including imaging, microbiology, ancillary and laboratory) are listed below for reference.     Microbiology: Recent Results (from the past 240 hour(s))  Urine culture     Status: None   Collection Time: 02/15/20  1:25 PM   Specimen: Urine, Clean Catch  Result Value Ref Range  Status   Specimen Description   Final    URINE, CLEAN CATCH Performed at [facility], [place] [address], [place], [place] [address]    Special Requests   Final    NONE Performed at [facility], [place] [address] [place], [place] [address]    Culture   Final    NO GROWTH Performed at [facility], [place] [address] [place], [place] [address]    Report Status 02/16/2020 FINAL  Final  Respiratory Panel by RT PCR (Flu A&B, Covid) - Nasopharyngeal Swab     Status: None   Collection Time: 02/15/20  5:20 PM   Specimen: Nasopharyngeal Swab  Result Value Ref Range Status   SARS Coronavirus 2 by RT PCR NEGATIVE NEGATIVE Final    Comment: (NOTE) SARS-CoV-2 target nucleic acids are NOT DETECTED.  The SARS-CoV-2 RNA is generally detectable in upper respiratoy specimens during the acute phase of infection. The lowest concentration of SARS-CoV-2 viral copies this assay can detect is 131 copies/mL. A negative result does not preclude SARS-Cov-2 infection and should not be used as the sole basis for treatment or other patient management decisions. A negative result may occur with  improper specimen collection/handling, submission of specimen other than nasopharyngeal swab, presence of viral mutation(s) within the areas targeted by this assay, and inadequate number of viral  copies (<131 copies/mL). A negative result must be combined with clinical observations, patient history, and epidemiological information. The expected result is Negative.  Fact Sheet for Patients:  PinkCheek.be  Fact Sheet for Healthcare Providers:  GravelBags.it  This test is no t yet approved or cleared by the [place] FDA and  has been authorized for detection and/or diagnosis of SARS-CoV-2 by FDA under an Emergency Use Authorization (EUA). This EUA will remain  in effect (meaning this test can be used) for the  duration of the COVID-19 declaration under Section 564(b)(1) of the Act, 21 U.S.C. section 360bbb-3(b)(1), unless the authorization is terminated or revoked sooner.     Influenza A by PCR NEGATIVE NEGATIVE Final   Influenza B by PCR NEGATIVE NEGATIVE Final    Comment: (NOTE) The Xpert Xpress SARS-CoV-2/FLU/RSV assay is intended as an aid in  the diagnosis of influenza from Nasopharyngeal swab specimens and  should not be used as a sole basis for treatment. Nasal washings and  aspirates are unacceptable for Xpert Xpress SARS-CoV-2/FLU/RSV  testing.  Fact Sheet for Patients: PinkCheek.be  Fact Sheet for Healthcare Providers: GravelBags.it  This test is not yet approved or cleared by the [place] FDA and  has been authorized for detection and/or diagnosis of SARS-CoV-2 by  FDA under an Emergency Use Authorization (EUA). This EUA will remain  in effect (meaning this test can be used) for the duration of the  Covid-19 declaration under Section 564(b)(1) of the Act, 21  U.S.C. section 360bbb-3(b)(1), unless the authorization is  terminated or revoked. Performed at [facility], [place] [address] [place], [place] [address]      Labs: BNP (last 3 results) Recent Labs    11/05/19 1821 11/09/19 0337 01/18/20 2032  BNP 719.2* 2,255.4* 811.0*   Basic Metabolic Panel: Recent Labs  Lab 02/15/20 1315 02/16/20 0533 02/17/20 0455 02/18/20 0422  NA 144 147* 143 141  K 3.2* 3.8 3.9 3.3*  CL 106 115* 112* 111  CO2 23 19* 24 22  GLUCOSE 72 77 86 98  BUN 37* 28* 20 15  CREATININE 2.53* 1.85* 1.44* 1.10*  CALCIUM 9.1 8.1* 8.2* 8.2*  MG  --   --  1.4* 2.0  PHOS  --   --  2.7 2.6   Liver Function Tests: Recent Labs  Lab 02/15/20 1315 02/17/20 0455 02/18/20 0422  AST 28  --   --   ALT 12  --   --   ALKPHOS 61  --   --   BILITOT 1.1  --   --   PROT 6.5  --   --   ALBUMIN 3.1* 2.6* 2.6*   No  results for input(s): LIPASE, AMYLASE in the last 168 hours. No results for input(s): AMMONIA in the last 168 hours. CBC: Recent Labs  Lab 02/15/20 1315 02/16/20 0533 02/17/20 0455 02/18/20 0422  WBC 7.0 4.5 4.2 4.8  NEUTROABS 3.2  --   --   --   HGB 10.5* 9.3* 9.9* 10.0*  HCT 31.6* 29.9* 31.0* 30.6*  MCV 84.3 88.7 87.3 87.4  PLT 209 139* 141* 144*   Cardiac Enzymes: Recent Labs  Lab 02/15/20 1315  CKTOTAL 40   BNP: Invalid input(s): POCBNP CBG: Recent Labs  Lab 02/17/20 1135 02/17/20 1629 02/17/20 2139 02/18/20 0851 02/18/20 1216  GLUCAP 80 103* 139* 71 90   D-Dimer No results for input(s): DDIMER in the last 72 hours. Hgb A1c No results for input(s): HGBA1C in the last 72  hours. Lipid Profile No results for input(s): CHOL, HDL, LDLCALC, TRIG, CHOLHDL, LDLDIRECT in the last 72 hours. Thyroid function studies No results for input(s): TSH, T4TOTAL, T3FREE, THYROIDAB in the last 72 hours.  Invalid input(s): FREET3 Anemia work up Recent Labs    02/18/20 0422  VITAMINB12 632  FOLATE 67.7  FERRITIN 88  TIBC 143*  IRON 56  RETICCTPCT 1.3   Urinalysis    Component Value Date/Time   COLORURINE YELLOW 02/15/2020 1325   APPEARANCEUR HAZY (A) 02/15/2020 1325   LABSPEC 1.015 02/15/2020 1325   PHURINE 5.0 02/15/2020 1325   GLUCOSEU NEGATIVE 02/15/2020 1325   HGBUR SMALL (A) 02/15/2020 1325   HGBUR trace-intact 05/14/2010 1402   BILIRUBINUR NEGATIVE 02/15/2020 1325   KETONESUR 5 (A) 02/15/2020 1325   PROTEINUR 30 (A) 02/15/2020 1325   UROBILINOGEN 1.0 03/30/2015 1540   NITRITE NEGATIVE 02/15/2020 1325   LEUKOCYTESUR LARGE (A) 02/15/2020 1325   Sepsis Labs Invalid input(s): PROCALCITONIN,  WBC,  LACTICIDVEN   Time coordinating discharge: 40 minutes  SIGNED:  Mercy Riding, MD  [facility] 02/18/2020, 4:56 PM  If 7PM-7AM, please contact night-coverage [email or web address]

## 2020-02-19 NOTE — Addendum Note (Signed)
Addended by: Durenda Age C on: 02/19/2020 12:25 PM   Modules accepted: Level of Service

## 2020-02-23 ENCOUNTER — Other Ambulatory Visit: Payer: Self-pay

## 2020-02-23 ENCOUNTER — Ambulatory Visit: Payer: Medicare HMO | Admitting: Cardiovascular Disease

## 2020-02-23 ENCOUNTER — Encounter: Payer: Self-pay | Admitting: Cardiovascular Disease

## 2020-02-23 VITALS — BP 100/58 | HR 92 | Ht 61.0 in | Wt 148.0 lb

## 2020-02-23 DIAGNOSIS — R109 Unspecified abdominal pain: Secondary | ICD-10-CM | POA: Diagnosis not present

## 2020-02-23 DIAGNOSIS — I2510 Atherosclerotic heart disease of native coronary artery without angina pectoris: Secondary | ICD-10-CM | POA: Diagnosis not present

## 2020-02-23 DIAGNOSIS — I248 Other forms of acute ischemic heart disease: Secondary | ICD-10-CM | POA: Diagnosis not present

## 2020-02-23 DIAGNOSIS — M549 Dorsalgia, unspecified: Secondary | ICD-10-CM

## 2020-02-23 DIAGNOSIS — I2489 Other forms of acute ischemic heart disease: Secondary | ICD-10-CM

## 2020-02-23 DIAGNOSIS — I10 Essential (primary) hypertension: Secondary | ICD-10-CM

## 2020-02-23 DIAGNOSIS — M5489 Other dorsalgia: Secondary | ICD-10-CM

## 2020-02-23 DIAGNOSIS — B9689 Other specified bacterial agents as the cause of diseases classified elsewhere: Secondary | ICD-10-CM

## 2020-02-23 DIAGNOSIS — N390 Urinary tract infection, site not specified: Secondary | ICD-10-CM

## 2020-02-23 HISTORY — DX: Dorsalgia, unspecified: M54.9

## 2020-02-23 LAB — UA/M W/RFLX CULTURE, COMP

## 2020-02-23 LAB — URINE CULTURE, COMPREHENSIVE

## 2020-02-23 NOTE — Progress Notes (Signed)
Cardiology Office Note   Date:  02/23/2020   ID:  Chloe Harding, Chloe Harding 12-29-1945, MRN [id number]  PCP:  [name], NP  Cardiologist:   [name], MD   No chief complaint on file.     History of Present Illness: Chloe Harding is a 74 y.o. 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  She was admitted to the hospital 10/2019 with shortness of breath and cough.  CT was negative for pulmonary embolism but was concerning for pneumonia and volume overload.  She was treated for both community-acquired pneumonia and diuresed.  She also had iron deficiency anemia and required transfusion with packed red blood cells.  Cardiology was consulted for elevated troponin.  Echocardiogram admission revealed LVEF 45 to 50% with global hypokinesis.  Overall it was thought to be due to demand ischemia.  Given her lack of ischemic symptoms and anemia she did not undergo cardiac catheterization.  She followed up with Coletta Memos, NP on 11/2019.  At that time she was feeling better and exercising 30 to 45 minutes daily.  Since her follow-up appointment Chloe Harding was admitted 01/2020 with altered mental status, acute kidney failure, and a fall.  She was found to have a Klebsiella UTI urinary tract infection.  She was treated with levofloxacin and her Foley catheter was exchanged.  She then presented to the hospital again later that month with encephalopathy and acute renal failure.  This was thought to be due to severe sepsis from a catheter associated urinary tract infection and she was treated with IV antibiotics.  She has been struggling with back pain.  She is back home with her daughter and doing PT.  She just started having pain in her L flank today.  It feels like burning.  She has no fever or chills.  She denies any  falls or injury.  She has no chest pain, shortness of breath, edema, orthopnea or PND.  She goes back to urology 10/14 to see if her foley can be removed.     Past Medical History:  Diagnosis Date  . Arthritis   . Back pain 02/23/2020  . Depression   . Diabetes mellitus   . Hiatal hernia   . Hypercholesteremia   . Hypertension   . Reflux   . Renal disorder    shutting down 4 years ago    Past Surgical History:  Procedure Laterality Date  . BALLOON DILATION N/A 01/25/2020   Procedure: BALLOON DILATION;  Surgeon: Carol Ada, MD;  Location: [facility];  Service: Endoscopy;  Laterality: N/A;  . CHOLECYSTECTOMY    . COLONOSCOPY WITH PROPOFOL N/A 05/20/2017   Procedure: COLONOSCOPY WITH PROPOFOL;  Surgeon: Carol Ada, MD;  Location: [facility];  Service: Endoscopy;  Laterality: N/A;  . ESOPHAGOGASTRODUODENOSCOPY N/A 05/19/2017   Procedure: ESOPHAGOGASTRODUODENOSCOPY (EGD);  Surgeon: Juanita Craver, MD;  Location: [facility];  Service: Endoscopy;  Laterality: N/A;  . ESOPHAGOGASTRODUODENOSCOPY (EGD) WITH PROPOFOL N/A 01/17/2019   Procedure: ESOPHAGOGASTRODUODENOSCOPY (EGD) WITH PROPOFOL;  Surgeon: Carol Ada, MD;  Location: [facility];  Service: Endoscopy;  Laterality: N/A;  . ESOPHAGOGASTRODUODENOSCOPY (EGD) WITH PROPOFOL N/A 01/25/2020   Procedure: ESOPHAGOGASTRODUODENOSCOPY (EGD) WITH PROPOFOL;  Surgeon: Carol Ada, MD;  Location: [facility];  Service: Endoscopy;  Laterality: N/A;  . SAVORY DILATION N/A 01/17/2019   Procedure: SAVORY DILATION;  Surgeon: Carol Ada, MD;  Location: [facility];  Service: Endoscopy;  Laterality: N/A;     Current Outpatient Medications  Medication Sig Dispense Refill  . acetaminophen (TYLENOL) 325 MG tablet Take 650 mg by mouth every 6 (six) hours as needed for moderate pain.     Marland Kitchen atorvastatin (LIPITOR) 20 MG tablet Take 1 tablet (20 mg total) by mouth daily. 90 tablet 0  . escitalopram (LEXAPRO) 10 MG tablet Take 1 tablet (10 mg total) by  mouth daily. 90 tablet 0  . folic acid (FOLVITE) 1 MG tablet Take 1 tablet (1 mg total) by mouth daily. 30 tablet 0  . furosemide (LASIX) 40 MG tablet Take 1 tablet (40 mg total) by mouth daily as needed for fluid or edema (or shortness of breath). 30 tablet 0  . hydroxychloroquine (PLAQUENIL) 200 MG tablet Take 1 tablet (200 mg total) by mouth daily. 90 tablet 0  . metoprolol tartrate (LOPRESSOR) 25 MG tablet Take 25 mg by mouth 2 (two) times daily. 1 Tablet Twice Daily    . Multiple Vitamin (MULTIVITAMIN WITH MINERALS) TABS tablet Take 1 tablet by mouth daily.    . Nutritional Supplements (NUTRITIONAL SUPPLEMENT PO) Take 1 each by mouth daily. Magic Cup     . ondansetron (ZOFRAN) 4 MG tablet Take 4 mg by mouth every 8 (eight) hours as needed for nausea or vomiting.     . pantoprazole (PROTONIX) 40 MG tablet Take 1 tablet (40 mg total) by mouth 2 (two) times daily. 180 tablet 0  . polyethylene glycol (MIRALAX / GLYCOLAX) 17 g packet Take 17 g by mouth daily as needed for moderate constipation or severe constipation. 14 each 0  . potassium chloride (KLOR-CON) 10 MEQ tablet Take 1 tablet (10 mEq total) by mouth daily. 90 tablet 0  . promethazine (PHENERGAN) 50 MG suppository Place 50 mg rectally every 6 (six) hours as needed for nausea or vomiting. 1 Suppository 3 Times Daily    . ropinirole (REQUIP) 2 MG tablet Take 2.5 tablets (5 mg total) by mouth at bedtime. 30 tablet 0  . Vitamin D3 (VITAMIN D) 25 MCG tablet Take 1 tablet (1,000 Units total) by mouth daily. 30 tablet 0   No current facility-administered medications for this visit.    Allergies:   Amlodipine besylate    Social History:  The patient  reports that she has never smoked. She has never used smokeless tobacco. She reports that she does not drink alcohol and does not use drugs.   Family History:  The patient's family history includes Heart attack in her brother and sister; Heart failure in her brother and sister.    ROS:   Please see the history of present illness.   Otherwise, review of systems are positive for none.   All other systems are reviewed and negative.    PHYSICAL EXAM: VS:  BP (!) 100/58   Pulse 92   Ht 5\' 1"  (1.549 m)   Wt 148 lb (67.1 kg)   SpO2 92%   BMI 27.96 kg/m  , BMI Body mass index is 27.96 kg/m. GENERAL:  Well appearing HEENT:  Pupils equal round and reactive, fundi not visualized, oral mucosa unremarkable NECK:  No jugular venous distention, waveform within normal limits, carotid upstroke brisk and symmetric, no bruits, no thyromegaly LYMPHATICS:  No cervical adenopathy LUNGS:  Clear to auscultation bilaterally HEART:  RRR.  PMI not displaced or sustained,S1 and S2 within normal limits, no S3, no S4, no clicks, no rubs, no murmurs ABD:  Flat,  positive bowel sounds normal in frequency in pitch, no bruits, no rebound, no guarding, no midline pulsatile mass, no hepatomegaly, no splenomegaly EXT:  2 plus pulses throughout, no edema, no cyanosis no clubbing SKIN:  No rashes no nodules.  +L flank TTP NEURO:  Cranial nerves II through XII grossly intact, motor grossly intact throughout PSYCH:  Cognitively intact, oriented to person place and time   EKG:  EKG is not ordered today.     Recent Labs: 01/18/2020: B Natriuretic Peptide 439.5 01/19/2020: TSH 0.702 02/15/2020: ALT 12 02/18/2020: BUN 15; Creatinine, Ser 1.10; Hemoglobin 10.0; Magnesium 2.0; Platelets 144; Potassium 3.3; Sodium 141    Lipid Panel    Component Value Date/Time   CHOL 189 03/25/2010 2105   TRIG 159 (H) 03/25/2010 2105   HDL 39 (L) 03/25/2010 2105   CHOLHDL 4.8 Ratio 03/25/2010 2105   VLDL 32 03/25/2010 2105   LDLCALC 118 (H) 03/25/2010 2105   LDLDIRECT 44 11/22/2019 1130      Wt Readings from Last 3 Encounters:  02/23/20 148 lb (67.1 kg)  02/15/20 147 lb 11.3 oz (67 kg)  02/14/20 148 lb 6.4 oz (67.3 kg)      ASSESSMENT AND PLAN:  # Chronic systolic and diastolic heart failure:  She is euvolemic  and feeling well.  Given her chronic anemia, frailty, and lack of ischemic symptoms, we will not pursue an ischemia evaluation.  Continue metoprolol and atorvastatin.  # L flank pain:  # Indwelling foley: # Recurrent UTI: Chloe Harding has new onset left flank pain.  There is no injury.  She has no fever or chills.  However given her recurrent urinary tract infections and indwelling Foley, we will get a sample of her urine to check for evidence of infection.  I recommended that she call her PCPs office to see if she can be seen today.  # Hyperlipidemia: Continue atorvastatin.    Current medicines are reviewed at length with the patient today.  The patient does not have concerns regarding medicines.  The following changes have been made:  no change  Labs/ tests ordered today include:   Orders Placed This Encounter  Procedures  . UA/M w/rflx Culture, Comp     Disposition:   FU with Fenris Cauble C. Oval Linsey, MD, [facility] in 6 months.    Signed, Derold Dorsch C. Oval Linsey, MD, [facility]  02/23/2020 12:26 PM    [place]

## 2020-02-23 NOTE — Patient Instructions (Signed)
Medication Instructions:  Your physician recommends that you continue on your current medications as directed. Please refer to the Current Medication list given to you today.  *If you need a refill on your cardiac medications before your next appointment, please call your pharmacy*  Lab Work: STAT URINALYSIS  If you have labs (blood work) drawn today and your tests are completely normal, you will receive your results only by: Marland Kitchen MyChart Message (if you have MyChart) OR . A paper copy in the mail If you have any lab test that is abnormal or we need to change your treatment, we will call you to review the results.   Testing/Procedures: NONE  Follow-Up: At [facility], you and your health needs are our priority.  As part of our continuing mission to provide you with exceptional heart care, we have created designated Provider Care Teams.  These Care Teams include your primary Cardiologist (physician) and Advanced Practice Providers (APPs -  Physician Assistants and Nurse Practitioners) who all work together to provide you with the care you need, when you need it.  We recommend signing up for the patient portal called "MyChart".  Sign up information is provided on this After Visit Summary.  MyChart is used to connect with patients for Virtual Visits (Telemedicine).  Patients are able to view lab/test results, encounter notes, upcoming appointments, etc.  Non-urgent messages can be sent to your provider as well.   To learn more about what you can do with MyChart, go to [email or web address].    Your next appointment:   6 month(s)  The format for your next appointment:   In Person  Provider:   You may see [name], MD or one of the following Advanced Practice Providers on your designated Care Team:    Kerin Ransom, PA-C  [place], [place]  Coletta Memos,   Other Instructions  [place]

## 2020-02-28 ENCOUNTER — Telehealth: Payer: Self-pay | Admitting: *Deleted

## 2020-02-28 NOTE — Telephone Encounter (Signed)
-----   Message from [name], MD sent at 02/23/2020  3:31 PM EDT ----- Labs are consistent with infection. Given her severe back pain and renal cast, this is pyelonephritis.  She has been hospitalized twice with complicated UTI requiring IV antibiotcs.  Culture pending. I think she need IV antibiotics due to high risk of multidrug resistant infection.  Attempted to call patient and spouse with no answer.  She should be evaluated by PCP at least and consider the ED.

## 2020-02-28 NOTE — Telephone Encounter (Signed)
Spoke with PCP and patient has not been seen since 10/6 Called to follow up since no return call. Spoke with patient who stated she was feeling better. Did explain she still would need to follow up PCP/ED secondary to infection, stated she would let her daughter know Called daughter and left message to call back. Results sent to PCP

## 2020-03-12 LAB — UA/M W/RFLX CULTURE, COMP
Bilirubin, UA: NEGATIVE
Glucose, UA: NEGATIVE
Ketones, UA: NEGATIVE
Nitrite, UA: NEGATIVE
RBC, UA: NEGATIVE
Specific Gravity, UA: 1.03 (ref 1.005–1.030)
Urobilinogen, Ur: 0.2 mg/dL (ref 0.2–1.0)
pH, UA: 6 (ref 5.0–7.5)

## 2020-03-12 LAB — MICROSCOPIC EXAMINATION
Epithelial Cells (non renal): >10 /hpf — AB (ref 0–10)
RBC, Urine: NONE SEEN /hpf (ref 0–2)
Renal Epithel, UA: >10 /hpf — AB

## 2020-03-14 ENCOUNTER — Telehealth: Payer: Self-pay | Admitting: Internal Medicine

## 2020-03-14 NOTE — Telephone Encounter (Signed)
Called patient's daughter-in-law, Chloe Harding, to schedule a Palliative Consult, no answer and unable to leave a message due to mailbox was full.  I called patient's daughter, Chloe Harding, to offer to schedule Palliative visit, no answer - left message with reason for call along with my name and contact number.  I then called husband's cell, no answer - left message with reason for call long with my name and contact number, requesting a return call.

## 2020-03-14 NOTE — Telephone Encounter (Signed)
Rec'd return call from husband, Delfino Lovett, and he put me on speaker phone and I was able to discuss Palliative referral/services with both of them.  All questions were answered and patient was in agreement with scheduling a visit with NP.  I have scheduled an In-person Consult for 03/19/20 @ 8:30 AM.

## 2020-03-19 ENCOUNTER — Other Ambulatory Visit: Payer: Medicare HMO | Admitting: Internal Medicine

## 2020-03-19 ENCOUNTER — Other Ambulatory Visit: Payer: Self-pay

## 2020-03-30 ENCOUNTER — Ambulatory Visit (HOSPITAL_COMMUNITY)
Admission: EM | Admit: 2020-03-30 | Discharge: 2020-03-30 | Disposition: A | Discharge status: Home / Self Care | Payer: Medicare HMO | Attending: Family Medicine | Admitting: Family Medicine

## 2020-03-30 ENCOUNTER — Other Ambulatory Visit: Payer: Self-pay

## 2020-03-30 DIAGNOSIS — Z1152 Encounter for screening for COVID-19: Secondary | ICD-10-CM

## 2020-03-30 LAB — SARS CORONAVIRUS 2 (TAT 6-24 HRS): SARS Coronavirus 2: NEGATIVE

## 2020-03-30 NOTE — Discharge Instructions (Signed)

## 2020-03-30 NOTE — ED Triage Notes (Signed)
Pt present covid exposure, pt states that her spouse tested positive for covid on Thursday 03/28/20. Pt denies any symptoms at this time.

## 2020-04-09 ENCOUNTER — Other Ambulatory Visit: Payer: Self-pay | Admitting: Adult Health

## 2020-04-09 DIAGNOSIS — F339 Major depressive disorder, recurrent, unspecified: Secondary | ICD-10-CM

## 2020-07-07 ENCOUNTER — Other Ambulatory Visit: Payer: Self-pay

## 2020-07-07 ENCOUNTER — Observation Stay (HOSPITAL_COMMUNITY): Payer: Medicare HMO

## 2020-07-07 ENCOUNTER — Emergency Department (HOSPITAL_COMMUNITY): Payer: Medicare HMO

## 2020-07-07 ENCOUNTER — Inpatient Hospital Stay (HOSPITAL_COMMUNITY)
Admission: EM | Admit: 2020-07-07 | Discharge: 2020-07-10 | DRG: 177 | Disposition: A | Discharge status: Home / Self Care | Payer: Medicare HMO | Attending: Internal Medicine | Admitting: Internal Medicine

## 2020-07-07 ENCOUNTER — Encounter (HOSPITAL_COMMUNITY): Payer: Self-pay

## 2020-07-07 DIAGNOSIS — K219 Gastro-esophageal reflux disease without esophagitis: Secondary | ICD-10-CM | POA: Diagnosis present

## 2020-07-07 DIAGNOSIS — J189 Pneumonia, unspecified organism: Secondary | ICD-10-CM

## 2020-07-07 DIAGNOSIS — M1990 Unspecified osteoarthritis, unspecified site: Secondary | ICD-10-CM | POA: Diagnosis present

## 2020-07-07 DIAGNOSIS — E1165 Type 2 diabetes mellitus with hyperglycemia: Secondary | ICD-10-CM | POA: Diagnosis present

## 2020-07-07 DIAGNOSIS — F339 Major depressive disorder, recurrent, unspecified: Secondary | ICD-10-CM | POA: Diagnosis present

## 2020-07-07 DIAGNOSIS — F419 Anxiety disorder, unspecified: Secondary | ICD-10-CM | POA: Diagnosis present

## 2020-07-07 DIAGNOSIS — Z79899 Other long term (current) drug therapy: Secondary | ICD-10-CM

## 2020-07-07 DIAGNOSIS — Z20822 Contact with and (suspected) exposure to covid-19: Secondary | ICD-10-CM | POA: Diagnosis present

## 2020-07-07 DIAGNOSIS — K449 Diaphragmatic hernia without obstruction or gangrene: Secondary | ICD-10-CM | POA: Diagnosis present

## 2020-07-07 DIAGNOSIS — K2289 Other specified disease of esophagus: Secondary | ICD-10-CM | POA: Diagnosis present

## 2020-07-07 DIAGNOSIS — I161 Hypertensive emergency: Secondary | ICD-10-CM | POA: Diagnosis present

## 2020-07-07 DIAGNOSIS — I700 Atherosclerosis of aorta: Secondary | ICD-10-CM | POA: Diagnosis present

## 2020-07-07 DIAGNOSIS — G2581 Restless legs syndrome: Secondary | ICD-10-CM | POA: Diagnosis present

## 2020-07-07 DIAGNOSIS — J9600 Acute respiratory failure, unspecified whether with hypoxia or hypercapnia: Secondary | ICD-10-CM | POA: Diagnosis present

## 2020-07-07 DIAGNOSIS — E785 Hyperlipidemia, unspecified: Secondary | ICD-10-CM | POA: Diagnosis present

## 2020-07-07 DIAGNOSIS — I2510 Atherosclerotic heart disease of native coronary artery without angina pectoris: Secondary | ICD-10-CM | POA: Diagnosis present

## 2020-07-07 DIAGNOSIS — M069 Rheumatoid arthritis, unspecified: Secondary | ICD-10-CM | POA: Diagnosis present

## 2020-07-07 DIAGNOSIS — N181 Chronic kidney disease, stage 1: Secondary | ICD-10-CM | POA: Diagnosis present

## 2020-07-07 DIAGNOSIS — Z888 Allergy status to other drugs, medicaments and biological substances status: Secondary | ICD-10-CM

## 2020-07-07 DIAGNOSIS — I130 Hypertensive heart and chronic kidney disease with heart failure and stage 1 through stage 4 chronic kidney disease, or unspecified chronic kidney disease: Secondary | ICD-10-CM | POA: Diagnosis not present

## 2020-07-07 DIAGNOSIS — Z8249 Family history of ischemic heart disease and other diseases of the circulatory system: Secondary | ICD-10-CM

## 2020-07-07 DIAGNOSIS — J690 Pneumonitis due to inhalation of food and vomit: Secondary | ICD-10-CM | POA: Diagnosis not present

## 2020-07-07 DIAGNOSIS — Z9049 Acquired absence of other specified parts of digestive tract: Secondary | ICD-10-CM

## 2020-07-07 DIAGNOSIS — K2100 Gastro-esophageal reflux disease with esophagitis, without bleeding: Secondary | ICD-10-CM | POA: Diagnosis present

## 2020-07-07 DIAGNOSIS — R54 Age-related physical debility: Secondary | ICD-10-CM | POA: Diagnosis present

## 2020-07-07 DIAGNOSIS — K222 Esophageal obstruction: Secondary | ICD-10-CM

## 2020-07-07 DIAGNOSIS — I5043 Acute on chronic combined systolic (congestive) and diastolic (congestive) heart failure: Secondary | ICD-10-CM | POA: Diagnosis present

## 2020-07-07 DIAGNOSIS — I5042 Chronic combined systolic (congestive) and diastolic (congestive) heart failure: Secondary | ICD-10-CM

## 2020-07-07 DIAGNOSIS — E041 Nontoxic single thyroid nodule: Secondary | ICD-10-CM | POA: Diagnosis present

## 2020-07-07 DIAGNOSIS — J9601 Acute respiratory failure with hypoxia: Secondary | ICD-10-CM | POA: Diagnosis present

## 2020-07-07 DIAGNOSIS — E7800 Pure hypercholesterolemia, unspecified: Secondary | ICD-10-CM | POA: Diagnosis present

## 2020-07-07 HISTORY — DX: Other specified bacterial agents as the cause of diseases classified elsewhere: B96.89

## 2020-07-07 HISTORY — DX: Unspecified fall, initial encounter: W19.XXXA

## 2020-07-07 HISTORY — DX: Obesity, unspecified: E66.9

## 2020-07-07 HISTORY — DX: Traumatic subdural hemorrhage with loss of consciousness status unknown, initial encounter: S06.5XAA

## 2020-07-07 HISTORY — DX: Nontoxic single thyroid nodule: E04.1

## 2020-07-07 HISTORY — DX: Esophageal obstruction: K22.2

## 2020-07-07 HISTORY — DX: Chronic combined systolic (congestive) and diastolic (congestive) heart failure: I50.42

## 2020-07-07 HISTORY — DX: Pneumonia, unspecified organism: J18.9

## 2020-07-07 HISTORY — DX: Retention of urine, unspecified: R33.9

## 2020-07-07 HISTORY — DX: Traumatic subdural hemorrhage with loss of consciousness of unspecified duration, initial encounter: S06.5X9A

## 2020-07-07 HISTORY — DX: Urinary tract infection, site not specified: N39.0

## 2020-07-07 HISTORY — DX: Atherosclerosis of aorta: I70.0

## 2020-07-07 HISTORY — DX: Atherosclerotic heart disease of native coronary artery without angina pectoris: I25.10

## 2020-07-07 LAB — COMPREHENSIVE METABOLIC PANEL
ALT: 8 U/L (ref 0–44)
AST: 17 U/L (ref 15–41)
Albumin: 3.8 g/dL (ref 3.5–5.0)
Alkaline Phosphatase: 92 U/L (ref 38–126)
Anion gap: 11 (ref 5–15)
BUN: 10 mg/dL (ref 8–23)
CO2: 24 mmol/L (ref 22–32)
Calcium: 9 mg/dL (ref 8.9–10.3)
Chloride: 102 mmol/L (ref 98–111)
Creatinine, Ser: 0.64 mg/dL (ref 0.44–1.00)
GFR, Estimated: >60 mL/min (ref >60)
Glucose, Bld: 180 mg/dL — ABNORMAL HIGH (ref 70–99)
Potassium: 4.1 mmol/L (ref 3.5–5.1)
Sodium: 137 mmol/L (ref 135–145)
Total Bilirubin: 0.7 mg/dL (ref 0.3–1.2)
Total Protein: 7.4 g/dL (ref 6.5–8.1)

## 2020-07-07 LAB — CBC WITH DIFFERENTIAL/PLATELET
Abs Immature Granulocytes: 0.02 10*3/uL (ref 0.00–0.07)
Basophils Absolute: 0.1 10*3/uL (ref 0.0–0.1)
Basophils Relative: 1 %
Eosinophils Absolute: 1.2 10*3/uL — ABNORMAL HIGH (ref 0.0–0.5)
Eosinophils Relative: 16 %
HCT: 34.3 % — ABNORMAL LOW (ref 36.0–46.0)
Hemoglobin: 10.8 g/dL — ABNORMAL LOW (ref 12.0–15.0)
Immature Granulocytes: 0 %
Lymphocytes Relative: 21 %
Lymphs Abs: 1.5 10*3/uL (ref 0.7–4.0)
MCH: 27.6 pg (ref 26.0–34.0)
MCHC: 31.5 g/dL (ref 30.0–36.0)
MCV: 87.7 fL (ref 80.0–100.0)
Monocytes Absolute: 0.4 10*3/uL (ref 0.1–1.0)
Monocytes Relative: 5 %
Neutro Abs: 4.1 10*3/uL (ref 1.7–7.7)
Neutrophils Relative %: 57 %
Platelets: 247 10*3/uL (ref 150–400)
RBC: 3.91 MIL/uL (ref 3.87–5.11)
RDW: 14.1 % (ref 11.5–15.5)
WBC: 7.2 10*3/uL (ref 4.0–10.5)
nRBC: 0 % (ref 0.0–0.2)

## 2020-07-07 LAB — I-STAT CHEM 8, ED
BUN: 8 mg/dL (ref 8–23)
Calcium, Ion: 1.2 mmol/L (ref 1.15–1.40)
Chloride: 102 mmol/L (ref 98–111)
Creatinine, Ser: 0.6 mg/dL (ref 0.44–1.00)
Glucose, Bld: 181 mg/dL — ABNORMAL HIGH (ref 70–99)
HCT: 34 % — ABNORMAL LOW (ref 36.0–46.0)
Hemoglobin: 11.6 g/dL — ABNORMAL LOW (ref 12.0–15.0)
Potassium: 4.1 mmol/L (ref 3.5–5.1)
Sodium: 138 mmol/L (ref 135–145)
TCO2: 26 mmol/L (ref 22–32)

## 2020-07-07 LAB — LACTIC ACID, PLASMA: Lactic Acid, Venous: 1.3 mmol/L (ref 0.5–1.9)

## 2020-07-07 LAB — BLOOD GAS, VENOUS
Acid-base deficit: 0.1 mmol/L (ref 0.0–2.0)
Bicarbonate: 26.9 mmol/L (ref 20.0–28.0)
FIO2: 21
O2 Saturation: 47.9 %
Patient temperature: 98.6
pCO2, Ven: 60 mmHg (ref 44.0–60.0)
pH, Ven: 7.274 (ref 7.250–7.430)
pO2, Ven: 31.2 mmHg — CL (ref 32.0–45.0)

## 2020-07-07 LAB — TROPONIN I (HIGH SENSITIVITY)
Troponin I (High Sensitivity): 12 ng/L (ref <18)
Troponin I (High Sensitivity): 17 ng/L (ref <18)

## 2020-07-07 LAB — TYPE AND SCREEN
ABO/RH(D): O POS
Antibody Screen: NEGATIVE

## 2020-07-07 LAB — D-DIMER, QUANTITATIVE: D-Dimer, Quant: 1.11 ug/mL-FEU — ABNORMAL HIGH (ref 0.00–0.50)

## 2020-07-07 LAB — RESP PANEL BY RT-PCR (FLU A&B, COVID) ARPGX2
Influenza A by PCR: NEGATIVE
Influenza B by PCR: NEGATIVE
SARS Coronavirus 2 by RT PCR: NEGATIVE

## 2020-07-07 LAB — BRAIN NATRIURETIC PEPTIDE: B Natriuretic Peptide: 381.5 pg/mL — ABNORMAL HIGH (ref 0.0–100.0)

## 2020-07-07 MED ORDER — ENOXAPARIN SODIUM 40 MG/0.4ML ~~LOC~~ SOLN
40.0000 mg | SUBCUTANEOUS | Status: DC
  Administered 2020-07-07 – 2020-07-09 (×3): 40 mg via SUBCUTANEOUS
  Filled 2020-07-07 (×3): qty 0.4

## 2020-07-07 MED ORDER — ACETAMINOPHEN 650 MG RE SUPP: 650.0000 mg | Freq: Four times a day (QID) | RECTAL | Status: DC | PRN

## 2020-07-07 MED ORDER — ONDANSETRON HCL 4 MG/2ML IJ SOLN: 4.0000 mg | Freq: Four times a day (QID) | INTRAMUSCULAR | Status: DC | PRN

## 2020-07-07 MED ORDER — NITROGLYCERIN IN D5W 200-5 MCG/ML-% IV SOLN
0.0000 ug/min | INTRAVENOUS | Status: DC
  Administered 2020-07-07: 5 ug/min via INTRAVENOUS
  Filled 2020-07-07: qty 250

## 2020-07-07 MED ORDER — IOHEXOL 350 MG/ML SOLN
100.0000 mL | Freq: Once | INTRAVENOUS | Status: AC | PRN
  Administered 2020-07-08: 100 mL via INTRAVENOUS

## 2020-07-07 MED ORDER — LABETALOL HCL 5 MG/ML IV SOLN: 10.0000 mg | INTRAVENOUS | Status: DC | PRN

## 2020-07-07 MED ORDER — SODIUM CHLORIDE 0.9 % IV SOLN
1.0000 g | Freq: Once | INTRAVENOUS | Status: AC
  Administered 2020-07-07: 1 g via INTRAVENOUS
  Filled 2020-07-07: qty 10

## 2020-07-07 MED ORDER — ONDANSETRON HCL 4 MG PO TABS: 4.0000 mg | ORAL_TABLET | Freq: Four times a day (QID) | ORAL | Status: DC | PRN

## 2020-07-07 MED ORDER — FUROSEMIDE 10 MG/ML IJ SOLN
40.0000 mg | Freq: Once | INTRAMUSCULAR | Status: AC
  Administered 2020-07-07: 40 mg via INTRAVENOUS
  Filled 2020-07-07: qty 4

## 2020-07-07 MED ORDER — ACETAMINOPHEN 325 MG PO TABS
650.0000 mg | ORAL_TABLET | Freq: Four times a day (QID) | ORAL | Status: DC | PRN
  Administered 2020-07-08: 650 mg via ORAL
  Filled 2020-07-07: qty 2

## 2020-07-07 MED ORDER — METHYLPREDNISOLONE SODIUM SUCC 125 MG IJ SOLR
125.0000 mg | Freq: Once | INTRAMUSCULAR | Status: AC
  Administered 2020-07-07: 125 mg via INTRAVENOUS
  Filled 2020-07-07: qty 2

## 2020-07-07 MED ORDER — ALBUTEROL SULFATE HFA 108 (90 BASE) MCG/ACT IN AERS
4.0000 | INHALATION_SPRAY | Freq: Once | RESPIRATORY_TRACT | Status: AC
  Administered 2020-07-07: 4 via RESPIRATORY_TRACT
  Filled 2020-07-07: qty 6.7

## 2020-07-07 NOTE — ED Triage Notes (Signed)
She reports increasing shortness of breath x 2 weeks. She is short of breath upon arrival. EMS and pt. State pt. Improved en route with the bi-pap initiated by paramedics. Dr. Ralene Bathe met pt. And pt. Was placed on O2 at 3 l.p.m. on arrival to E.D. she remains short of breath and is awake, alert and oriented x 4. She denies fever, nor any other sign of current illness.

## 2020-07-07 NOTE — H&P (Signed)
History and Physical    Chloe Harding NAT:[id number] DOB: 04/04/1946 DOA: 07/07/2020  PCP: [name], NP  Patient coming from: Home via EMS  I have personally briefly reviewed patient's old medical records in [place]  Chief Complaint: Shortness of breath  HPI: Chloe Harding is a 75 y.o. female with medical history significant for chronic combined systolic and diastolic CHF (EF 42-70%, G1 DD, LV global hypokinesis by TTE 11/06/2019), hypertension, hyperlipidemia, rheumatoid arthritis, depression, dysphagia and history of esophageal stenosis s/p dilation 01/25/2020 who presents to the ED for evaluation of shortness of breath.  Patient states she has been having 2 weeks of progressive shortness of breath worse with exertion but now occurring even at rest.  She reported experiencing orthopnea last night and had to sleep sitting up in her recliner.  She has had an associated nonproductive cough.  She had sudden severe worsening of her dyspnea earlier today and called EMS.  She was placed on CPAP on route to the ED.  Patient otherwise denies any subjective fevers, chills, diaphoresis, chest pain, palpitations, abdominal pain, nausea, vomiting, dysuria, or lower extremity swelling.  She reports her weights have been up and down.  ED Course:  Initial vitals showed BP 197/84, pulse 110, RR 25, temp 97.7 F, SPO2 100% while on BiPAP.  Labs show WBC 7.2, hemoglobin 10.8, platelets 247,000, sodium 137, potassium 4.1, bicarb 24, BUN 10, creatinine 0.64, serum glucose 180, LFTs within normal limits, high-sensitivity troponin I 12, BNP 381.5.  SARS-CoV-2 PCR is negative.  Influenza A/B PCR's are negative.  Portable chest x-ray shows mild right basilar opacity, representing atelectasis versus developing infection.  Patient was placed on BiPAP with improvement in respiratory status.  She was given albuterol inhaler and IV Solu-Medrol 125 mg.  She was placed on IV nitroglycerin with improvement  in blood pressure.  She was also given IV ceftriaxone and ordered to receive IV Lasix 40 mg once.  The hospitalist service was consulted to admit for further evaluation and management.  Review of Systems: All systems reviewed and are negative except as documented in history of present illness above.   Past Medical History:  Diagnosis Date  . Arthritis   . Back pain 02/23/2020  . Depression   . Diabetes mellitus   . Hiatal hernia   . Hypercholesteremia   . Hypertension   . Reflux   . Renal disorder    shutting down 4 years ago    Past Surgical History:  Procedure Laterality Date  . BALLOON DILATION N/A 01/25/2020   Procedure: BALLOON DILATION;  Surgeon: Carol Ada, MD;  Location: [facility];  Service: Endoscopy;  Laterality: N/A;  . CHOLECYSTECTOMY    . COLONOSCOPY WITH PROPOFOL N/A 05/20/2017   Procedure: COLONOSCOPY WITH PROPOFOL;  Surgeon: Carol Ada, MD;  Location: [facility];  Service: Endoscopy;  Laterality: N/A;  . ESOPHAGOGASTRODUODENOSCOPY N/A 05/19/2017   Procedure: ESOPHAGOGASTRODUODENOSCOPY (EGD);  Surgeon: Juanita Craver, MD;  Location: [facility] END[facility];  Service: Endoscopy;  Laterality: N/A;  . ESOPHAGOGASTRODUODENOSCOPY (EGD) WITH PROPOFOL N/A 01/17/2019   Procedure: ESOPHAGOGASTRODUODENOSCOPY (EGD) WITH PROPOFOL;  Surgeon: Carol Ada, MD;  Location: [facility];  Service: Endoscopy;  Laterality: N/A;  . ESOPHAGOGASTRODUODENOSCOPY (EGD) WITH PROPOFOL N/A 01/25/2020   Procedure: ESOPHAGOGASTRODUODENOSCOPY (EGD) WITH PROPOFOL;  Surgeon: Carol Ada, MD;  Location: [facility];  Service: Endoscopy;  Laterality: N/A;  . SAVORY DILATION N/A 01/17/2019   Procedure: SAVORY DILATION;  Surgeon: Carol Ada, MD;  Location: [facility];  Service: Endoscopy;  Laterality:  N/A;    Social History:  reports that she has never smoked. She has never used smokeless tobacco. She reports that she does not drink alcohol and does not use drugs.  Allergies  Allergen Reactions  .  Amlodipine Besylate Swelling    swelling    Family History  Problem Relation Age of Onset  . Heart attack Sister   . Heart failure Sister   . Heart attack Brother   . Heart failure Brother      Prior to Admission medications   Medication Sig Start Date End Date Taking? Authorizing Provider  acetaminophen (TYLENOL) 325 MG tablet Take 650 mg by mouth every 6 (six) hours as needed for moderate pain.     [provider]  atorvastatin (LIPITOR) 20 MG tablet Take 1 tablet (20 mg total) by mouth daily. 02/18/20   Mercy Riding, MD  escitalopram (LEXAPRO) 10 MG tablet Take 1 tablet (10 mg total) by mouth daily. 02/18/20   Mercy Riding, MD  folic acid (FOLVITE) 1 MG tablet Take 1 tablet (1 mg total) by mouth daily. 02/14/20   Medina-Vargas, Monina C, NP  furosemide (LASIX) 40 MG tablet Take 1 tablet (40 mg total) by mouth daily as needed for fluid or edema (or shortness of breath). 02/23/20   Mercy Riding, MD  hydroxychloroquine (PLAQUENIL) 200 MG tablet Take 1 tablet (200 mg total) by mouth daily. 02/18/20   Mercy Riding, MD  metoprolol tartrate (LOPRESSOR) 25 MG tablet Take 25 mg by mouth 2 (two) times daily. 1 Tablet Twice Daily    [provider]  Multiple Vitamin (MULTIVITAMIN WITH MINERALS) TABS tablet Take 1 tablet by mouth daily. 02/19/20   Mercy Riding, MD  Nutritional Supplements (NUTRITIONAL SUPPLEMENT PO) Take 1 each by mouth daily. Magic Cup     [provider]  ondansetron (ZOFRAN) 4 MG tablet Take 4 mg by mouth every 8 (eight) hours as needed for nausea or vomiting.     [provider]  pantoprazole (PROTONIX) 40 MG tablet Take 1 tablet (40 mg total) by mouth 2 (two) times daily. 02/18/20   Mercy Riding, MD  polyethylene glycol (MIRALAX / GLYCOLAX) 17 g packet Take 17 g by mouth daily as needed for moderate constipation or severe constipation. 11/09/19   Amin, Jeanella Flattery, MD  potassium chloride (KLOR-CON) 10 MEQ tablet Take 1 tablet (10 mEq total)  by mouth daily. 02/18/20   Mercy Riding, MD  promethazine (PHENERGAN) 50 MG suppository Place 50 mg rectally every 6 (six) hours as needed for nausea or vomiting. 1 Suppository 3 Times Daily    [provider]  ropinirole (REQUIP) 2 MG tablet Take 2.5 tablets (5 mg total) by mouth at bedtime. 02/18/20   Mercy Riding, MD  Vitamin D3 (VITAMIN D) 25 MCG tablet Take 1 tablet (1,000 Units total) by mouth daily. 02/14/20   Medina-Vargas, Jaymes Graff C, NP    Physical Exam: Vitals:   07/07/20 2117 07/07/20 2153 07/08/20 0018 07/08/20 0100  BP: 108/66 (!) 142/86    Pulse: (!) 121 (!) 113 (!) 111   Resp: 20 17 (!) 21   Temp:    98.4 F (36.9 C)  TempSrc:    Axillary  SpO2: 100% 100% 99%    Constitutional: Elderly woman resting in bed currently wearing BiPAP, NAD, calm, comfortable Eyes: PERRL, lids and conjunctivae normal ENMT: Mucous membranes are moist. Posterior pharynx clear of any exudate or lesions.Normal dentition.  Neck: normal, supple,  no masses. Respiratory: Bibasilar inspiratory crackles.  Normal respiratory effort while on BiPAP. No accessory muscle use.  Cardiovascular: Tachycardic, no murmurs / rubs / gallops. No extremity edema. 2+ pedal pulses. Abdomen: no tenderness, no masses palpated. Bowel sounds positive.  Musculoskeletal: no clubbing / cyanosis. No joint deformity upper and lower extremities. Good ROM, no contractures. Normal muscle tone.  Skin: no rashes, lesions, ulcers. No induration Neurologic: CN 2-12 grossly intact. Sensation intact. Strength 5/5 in all 4.  Psychiatric: Normal judgment and insight. Alert and oriented x 3. Normal mood.   Labs on Admission: I have personally reviewed following labs and imaging studies  CBC: Recent Labs  Lab 07/07/20 1930 07/07/20 1946  WBC 7.2  --   NEUTROABS 4.1  --   HGB 10.8* 11.6*  HCT 34.3* 34.0*  MCV 87.7  --   PLT 247  --    Basic Metabolic Panel: Recent Labs  Lab 07/07/20 1930 07/07/20 1946  NA 137 138  K  4.1 4.1  CL 102 102  CO2 24  --   GLUCOSE 180* 181*  BUN 10 8  CREATININE 0.64 0.60  CALCIUM 9.0  --    GFR: CrCl cannot be calculated (Unknown ideal weight.). Liver Function Tests: Recent Labs  Lab 07/07/20 1930  AST 17  ALT 8  ALKPHOS 92  BILITOT 0.7  PROT 7.4  ALBUMIN 3.8   No results for input(s): LIPASE, AMYLASE in the last 168 hours. No results for input(s): AMMONIA in the last 168 hours. Coagulation Profile: No results for input(s): INR, PROTIME in the last 168 hours. Cardiac Enzymes: No results for input(s): CKTOTAL, CKMB, CKMBINDEX, TROPONINI in the last 168 hours. BNP (last 3 results) No results for input(s): PROBNP in the last 8760 hours. HbA1C: No results for input(s): HGBA1C in the last 72 hours. CBG: No results for input(s): GLUCAP in the last 168 hours. Lipid Profile: No results for input(s): CHOL, HDL, LDLCALC, TRIG, CHOLHDL, LDLDIRECT in the last 72 hours. Thyroid Function Tests: No results for input(s): TSH, T4TOTAL, FREET4, T3FREE, THYROIDAB in the last 72 hours. Anemia Panel: No results for input(s): VITAMINB12, FOLATE, FERRITIN, TIBC, IRON, RETICCTPCT in the last 72 hours. Urine analysis:    Component Value Date/Time   COLORURINE YELLOW 02/15/2020 1325   APPEARANCEUR Clear 02/23/2020 1104   LABSPEC 1.015 02/15/2020 1325   PHURINE 5.0 02/15/2020 1325   GLUCOSEU Negative 02/23/2020 1104   HGBUR SMALL (A) 02/15/2020 1325   HGBUR trace-intact 05/14/2010 1402   BILIRUBINUR Negative 02/23/2020 1104   KETONESUR 5 (A) 02/15/2020 1325   PROTEINUR Trace 02/23/2020 1104   PROTEINUR 30 (A) 02/15/2020 1325   UROBILINOGEN 1.0 03/30/2015 1540   NITRITE Negative 02/23/2020 1104   NITRITE NEGATIVE 02/15/2020 1325   LEUKOCYTESUR Trace (A) 02/23/2020 1104   LEUKOCYTESUR LARGE (A) 02/15/2020 1325    Radiological Exams on Admission: CT ANGIO CHEST PE W OR WO CONTRAST  Result Date: 07/08/2020 CLINICAL DATA:  Positive D-dimer.  Shortness of breath. EXAM:  CT ANGIOGRAPHY CHEST WITH CONTRAST TECHNIQUE: Multidetector CT imaging of the chest was performed using the standard protocol during bolus administration of intravenous contrast. Multiplanar CT image reconstructions and MIPs were obtained to evaluate the vascular anatomy. CONTRAST:  158mL OMNIPAQUE IOHEXOL 350 MG/ML SOLN COMPARISON:  Chest radiography yesterday. Previous CT angiography 11/05/2019 FINDINGS: Cardiovascular: Heart size is normal. Coronary artery calcification is present. Aortic atherosclerotic calcification is present. Pulmonary arterial opacification is good. There are no pulmonary emboli. Mediastinum/Nodes: The esophagus is dilated and  full of fluid, air and bowel contents. No mediastinal mass or lymphadenopathy. Lungs/Pleura: Mild emphysema at the lung apices. On the left, there is no pleural fluid. There is minimal atelectasis or scar at the left base. On the right, there is a very tiny amount of pleural fluid layering dependently. 5 mm nodule at the apex is unchanged. Chronic elevation of the right hemidiaphragm with volume loss and or pneumonia in the right lower lobe. This is more pronounced than was seen in Nov 10, 2019. Upper Abdomen: Small hiatal hernia. No acute upper abdominal finding. Musculoskeletal: Ordinary chronic thoracic degenerative changes. Review of the MIP images confirms the above findings. IMPRESSION: 1. No pulmonary emboli. 2. Chronic elevation of the right hemidiaphragm with volume loss and/or pneumonia in the right lower lobe. This is more pronounced than was seen in 2019/11/10. 3. Dilated esophagus with fluid, air and bowel contents. 4. Aortic atherosclerosis. Coronary artery calcification. Aortic Atherosclerosis (ICD10-I70.0) and Emphysema (ICD10-J43.9). Electronically Signed   By: Nelson Chimes M.D.   On: 07/08/2020 00:51   DG Chest Port 1 View  Result Date: 07/07/2020 CLINICAL DATA:  Shortness of breath. EXAM: PORTABLE CHEST 1 VIEW COMPARISON:  February 15, 2020  FINDINGS: Mild new opacity in the right base. The lungs are otherwise clear. The cardiomediastinal silhouette is stable and unremarkable. No pneumothorax. IMPRESSION: Mild right basilar opacity could represent atelectasis or developing infection. Recommend short-term follow-up imaging to ensure resolution. Electronically Signed   By: Dorise Bullion III M.D   On: 07/07/2020 19:09    EKG: Ordered and pending.  Assessment/Plan Principal Problem:   Acute respiratory failure (HCC) Active Problems:   Pure hypercholesterolemia   Hypertensive emergency   Acute on chronic combined systolic and diastolic CHF (congestive heart failure) (HCC)  CASADY VOSHELL is a 75 y.o. female with medical history significant for chronic combined systolic and diastolic CHF (EF 41-28%, G1 DD, LV global hypokinesis by TTE 2019/11/10), hypertension, hyperlipidemia, rheumatoid arthritis, depression, dysphagia and history of esophageal stenosis s/p dilation 01/25/2020 who is admitted with acute respiratory failure due to flash pulmonary edema in the setting of hypertensive emergency and acute on chronic combined systolic and diastolic CHF.  Acute respiratory failure due to flash pulmonary edema Acute on chronic combined systolic and diastolic CHF: Suspect triggered by hypertensive emergency.  BP significantly improved after nitroglycerin infusion.  Respiratory symptoms improved with BiPAP.  Chest x-ray with questionable right lung base atelectasis versus developing infiltrate.  Does not have any obvious infectious symptoms or leukocytosis however will cover with empiric antibiotics for now.  She has however been tachycardic and D-dimer is elevated therefore we will further evaluate for PE. -Admit to stepdown unit for continued BiPAP as needed -Wean off BiPAP to supplemental O2 via [place] as able -Discontinue nitroglycerin drip -IV Lasix 40 mg to be given -Strict I/O's and daily weights -Empiric IV ceftriaxone and azithromycin -CTA  chest to rule out PE, further characterize lung infiltrates  Hypertensive emergency: Improved after nitroglycerin infusion which is now discontinued.  Receiving IV Lasix 40 mg once.  Placed on IV labetalol as needed.  Resume home meds when off BiPAP.  Hyperlipidemia: Resume home atorvastatin when able.  Rheumatoid arthritis: On Plaquenil as an outpatient.  Resume when able.  Chronic dysphagia and esophageal stricture: S/p dilatation 01/25/2020 by Dr. Benson Norway.  Depression/anxiety: Resume Lexapro when off BiPAP.  Restless leg syndrome: Resume ropinirole when off BiPAP.  DVT prophylaxis: Lovenox Code Status: Full code, confirmed with patient on admission Family Communication: Discussed  with patient Disposition Plan: From home and likely discharge to home pending improvement in respiratory status Consults called: None Level of care: Stepdown Admission status:  Status is: Observation  The patient remains OBS appropriate and will d/c before 2 midnights.  Dispo: The patient is from: Home              Anticipated d/c is to: Home              Anticipated d/c date is: 1 day              Patient currently is not medically stable to d/c.   Difficult to place patient No   Zada Finders MD [facility]  If 7PM-7AM, please contact night-coverage [email or web address]  07/08/2020, 1:15 AM

## 2020-07-07 NOTE — ED Provider Notes (Signed)
[facility] DEPT Provider Note   CSN: [id number] Arrival date & time: 07/07/20  1813     History Chief Complaint  Patient presents with  . Shortness of Breath    Chloe Harding is a 75 y.o. female.  The history is provided by the patient, the EMS personnel and medical records.  Shortness of Breath  Chloe Harding is a 75 y.o. female who presents to the Emergency Department complaining of sob.  She presents to the ED by EMS from home for evaluation of sob.  She has had SOB and DOE for the last several days with associated nonproductive  Cough.  Today she became abruptly more sob and EMS was called.  On their arrival she had oxygen sats in the 80s. She was treated with three sublingual nitroglycerin's, albuterol and C pap. They reports that her work of breathing significantly improved after intervention. She does state that she went to urgent care today due to the cough and she was tested for flu and COVID, which were negative and she was started on an antibiotic. She denies any fevers, chest pain, abdominal pain, nausea, vomiting. She has chronic lower extremity edema and this is at her baseline. No black or bloody stools.    Past Medical History:  Diagnosis Date  . Arthritis   . Back pain 02/23/2020  . Depression   . Diabetes mellitus   . Hiatal hernia   . Hypercholesteremia   . Hypertension   . Reflux   . Renal disorder    shutting down 4 years ago    Patient Active Problem List   Diagnosis Date Noted  . Acute respiratory failure ([place]) 07/07/2020  . Hypertensive emergency 07/07/2020  . Acute on chronic combined systolic and diastolic CHF (congestive heart failure) ([place]) 07/07/2020  . Back pain 02/23/2020  . Tachycardia 02/15/2020  . Fall   . Neurocognitive deficits 01/30/2020  . AKI (acute kidney injury) ([place]) 01/19/2020  . Acute encephalopathy 01/18/2020  . Diarrhea 12/23/2019  . SOB (shortness of breath)   . Demand ischemia ([place])   .  CAD in native artery   . Pneumonia 11/05/2019  . Hyponatremia 11/05/2019  . Acute CHF (congestive heart failure) ([place]) 11/05/2019  . Esophageal stricture   . Dysphagia 01/15/2019  . Symptomatic anemia 05/18/2017  . Acute-on-chronic kidney injury ([place]) 05/18/2017  . Hypertension 05/18/2017  . Hiatal hernia 05/18/2017  . History of esophageal stricture 05/18/2017  . Pure hypercholesterolemia 05/18/2017  . Diabetes mellitus 05/18/2017  . Solitary thyroid nodule 05/18/2017  . Hypotension 03/30/2015  . Sepsis ([place]) 03/30/2015  . CAP (community acquired pneumonia) 03/30/2015  . Hyperkalemia 03/30/2015  . Mild renal insufficiency 03/30/2015  . Nausea with vomiting 03/30/2015  . Diabetic neuropathy ([place]) 03/30/2015  . OBESITY 03/11/2010  . Diabetes with neurologic complications ([place]) 86/76/1949  . Dyslipidemia 12/26/2009  . ANEMIA 12/26/2009  . Major depression, recurrent ([place]) 12/26/2009  . HYPERTENSION, BENIGN ESSENTIAL 12/26/2009  . GERD 12/26/2009  . HIATAL HERNIA 12/26/2009  . CHRONIC KIDNEY DISEASE STAGE I 12/26/2009  . UTI due to Klebsiella species 12/26/2009  . Arthropathy 12/26/2009  . ARTHROSCOPY, RIGHT KNEE, HX OF 12/26/2009    Past Surgical History:  Procedure Laterality Date  . BALLOON DILATION N/A 01/25/2020   Procedure: BALLOON DILATION;  Surgeon: Carol Ada, MD;  Location: [facility];  Service: Endoscopy;  Laterality: N/A;  . CHOLECYSTECTOMY    . COLONOSCOPY WITH PROPOFOL N/A 05/20/2017   Procedure: COLONOSCOPY WITH PROPOFOL;  Surgeon:  Carol Ada, MD;  Location: [facility];  Service: Endoscopy;  Laterality: N/A;  . ESOPHAGOGASTRODUODENOSCOPY N/A 05/19/2017   Procedure: ESOPHAGOGASTRODUODENOSCOPY (EGD);  Surgeon: Juanita Craver, MD;  Location: [facility] ENDOSCOPY;  Service: Endoscopy;  Laterality: N/A;  . ESOPHAGOGASTRODUODENOSCOPY (EGD) WITH PROPOFOL N/A 01/17/2019   Procedure: ESOPHAGOGASTRODUODENOSCOPY (EGD) WITH PROPOFOL;  Surgeon: Carol Ada, MD;  Location: [facility]  ENDOSCOPY;  Service: Endoscopy;  Laterality: N/A;  . ESOPHAGOGASTRODUODENOSCOPY (EGD) WITH PROPOFOL N/A 01/25/2020   Procedure: ESOPHAGOGASTRODUODENOSCOPY (EGD) WITH PROPOFOL;  Surgeon: Carol Ada, MD;  Location: [facility] ENDOSCOPY;  Service: Endoscopy;  Laterality: N/A;  . SAVORY DILATION N/A 01/17/2019   Procedure: SAVORY DILATION;  Surgeon: Carol Ada, MD;  Location: [facility] ENDOSCOPY;  Service: Endoscopy;  Laterality: N/A;     OB History   No obstetric history on file.     Family History  Problem Relation Age of Onset  . Heart attack Sister   . Heart failure Sister   . Heart attack Brother   . Heart failure Brother     Social History   Tobacco Use  . Smoking status: Never Smoker  . Smokeless tobacco: Never Used  Vaping Use  . Vaping Use: Never used  Substance Use Topics  . Alcohol use: No  . Drug use: No    Home Medications Prior to Admission medications   Medication Sig Start Date End Date Taking? Authorizing Provider  acetaminophen (TYLENOL) 325 MG tablet Take 650 mg by mouth every 6 (six) hours as needed for moderate pain.     [provider]  atorvastatin (LIPITOR) 20 MG tablet Take 1 tablet (20 mg total) by mouth daily. 02/18/20   Mercy Riding, MD  escitalopram (LEXAPRO) 10 MG tablet Take 1 tablet (10 mg total) by mouth daily. 02/18/20   Mercy Riding, MD  folic acid (FOLVITE) 1 MG tablet Take 1 tablet (1 mg total) by mouth daily. 02/14/20   Medina-Vargas, Monina C, NP  furosemide (LASIX) 40 MG tablet Take 1 tablet (40 mg total) by mouth daily as needed for fluid or edema (or shortness of breath). 02/23/20   Mercy Riding, MD  hydroxychloroquine (PLAQUENIL) 200 MG tablet Take 1 tablet (200 mg total) by mouth daily. 02/18/20   Mercy Riding, MD  metoprolol tartrate (LOPRESSOR) 25 MG tablet Take 25 mg by mouth 2 (two) times daily. 1 Tablet Twice Daily    [provider]  Multiple Vitamin (MULTIVITAMIN WITH MINERALS) TABS tablet Take 1 tablet by mouth daily.  02/19/20   Mercy Riding, MD  Nutritional Supplements (NUTRITIONAL SUPPLEMENT PO) Take 1 each by mouth daily. Magic Cup     [provider]  ondansetron (ZOFRAN) 4 MG tablet Take 4 mg by mouth every 8 (eight) hours as needed for nausea or vomiting.     [provider]  pantoprazole (PROTONIX) 40 MG tablet Take 1 tablet (40 mg total) by mouth 2 (two) times daily. 02/18/20   Mercy Riding, MD  polyethylene glycol (MIRALAX / GLYCOLAX) 17 g packet Take 17 g by mouth daily as needed for moderate constipation or severe constipation. 11/09/19   Amin, Jeanella Flattery, MD  potassium chloride (KLOR-CON) 10 MEQ tablet Take 1 tablet (10 mEq total) by mouth daily. 02/18/20   Mercy Riding, MD  promethazine (PHENERGAN) 50 MG suppository Place 50 mg rectally every 6 (six) hours as needed for nausea or vomiting. 1 Suppository 3 Times Daily    [provider]  ropinirole (REQUIP) 2 MG tablet Take 2.5  tablets (5 mg total) by mouth at bedtime. 02/18/20   Mercy Riding, MD  Vitamin D3 (VITAMIN D) 25 MCG tablet Take 1 tablet (1,000 Units total) by mouth daily. 02/14/20   Medina-Vargas, Monina C, NP    Allergies    Amlodipine besylate  Review of Systems   Review of Systems  Respiratory: Positive for shortness of breath.   All other systems reviewed and are negative.   Physical Exam Updated Vital Signs BP (!) 142/86   Pulse (!) 113   Temp 97.7 F (36.5 C) (Oral)   Resp 17   SpO2 100%   Physical Exam Vitals and nursing note reviewed.  Constitutional:      General: She is in acute distress.     Appearance: She is well-developed and well-nourished. She is ill-appearing.  HENT:     Head: Normocephalic and atraumatic.  Cardiovascular:     Rate and Rhythm: Regular rhythm. Tachycardia present.     Heart sounds: No murmur heard.   Pulmonary:     Effort: Respiratory distress present.     Breath sounds: Wheezing and rhonchi present.  Abdominal:     Palpations: Abdomen is soft.      Tenderness: There is no abdominal tenderness. There is no guarding or rebound.  Musculoskeletal:        General: No tenderness or edema.     Comments: Trace edema to bilateral lower extremities  Skin:    General: Skin is warm and dry.     Coloration: Skin is pale.  Neurological:     Mental Status: She is alert and oriented to person, place, and time.  Psychiatric:        Mood and Affect: Mood and affect normal.        Behavior: Behavior normal.     ED Results / Procedures / Treatments   Labs (all labs ordered are listed, but only abnormal results are displayed) Labs Reviewed  COMPREHENSIVE METABOLIC PANEL - Abnormal; Notable for the following components:      Result Value   Glucose, Bld 180 (*)    All other components within normal limits  CBC WITH DIFFERENTIAL/PLATELET - Abnormal; Notable for the following components:   Hemoglobin 10.8 (*)    HCT 34.3 (*)    Eosinophils Absolute 1.2 (*)    All other components within normal limits  BRAIN NATRIURETIC PEPTIDE - Abnormal; Notable for the following components:   B Natriuretic Peptide 381.5 (*)    All other components within normal limits  BLOOD GAS, VENOUS - Abnormal; Notable for the following components:   pO2, Ven 31.2 (*)    All other components within normal limits  D-DIMER, QUANTITATIVE - Abnormal; Notable for the following components:   D-Dimer, Quant 1.11 (*)    All other components within normal limits  I-STAT CHEM 8, ED - Abnormal; Notable for the following components:   Glucose, Bld 181 (*)    Hemoglobin 11.6 (*)    HCT 34.0 (*)    All other components within normal limits  RESP PANEL BY RT-PCR (FLU A&B, COVID) ARPGX2  CULTURE, BLOOD (ROUTINE X 2)  CULTURE, BLOOD (ROUTINE X 2)  LACTIC ACID, PLASMA  PROCALCITONIN  MAGNESIUM  BASIC METABOLIC PANEL  CBC  TYPE AND SCREEN  TROPONIN I (HIGH SENSITIVITY)  TROPONIN I (HIGH SENSITIVITY)    EKG None  Radiology DG Chest Port 1 View  Result Date:  07/07/2020 CLINICAL DATA:  Shortness of breath. EXAM: PORTABLE CHEST 1 VIEW COMPARISON:  February 15, 2020 FINDINGS: Mild new opacity in the right base. The lungs are otherwise clear. The cardiomediastinal silhouette is stable and unremarkable. No pneumothorax. IMPRESSION: Mild right basilar opacity could represent atelectasis or developing infection. Recommend short-term follow-up imaging to ensure resolution. Electronically Signed   By: Dorise Bullion III M.D   On: 07/07/2020 19:09    Procedures Procedures  CRITICAL CARE Performed by: Quintella Reichert   Total critical care time: 35 minutes  Critical care time was exclusive of separately billable procedures and treating other patients.  Critical care was necessary to treat or prevent imminent or life-threatening deterioration.  Critical care was time spent personally by me on the following activities: development of treatment plan with patient and/or surrogate as well as nursing, discussions with consultants, evaluation of patient's response to treatment, examination of patient, obtaining history from patient or surrogate, ordering and performing treatments and interventions, ordering and review of laboratory studies, ordering and review of radiographic studies, pulse oximetry and re-evaluation of patient's condition.  Medications Ordered in ED Medications  furosemide (LASIX) injection 40 mg (has no administration in time range)  enoxaparin (LOVENOX) injection 40 mg (has no administration in time range)  acetaminophen (TYLENOL) tablet 650 mg (has no administration in time range)    Or  acetaminophen (TYLENOL) suppository 650 mg (has no administration in time range)  ondansetron (ZOFRAN) tablet 4 mg (has no administration in time range)    Or  ondansetron (ZOFRAN) injection 4 mg (has no administration in time range)  albuterol (VENTOLIN HFA) 108 (90 Base) MCG/ACT inhaler 4 puff (4 puffs Inhalation Given 07/07/20 1947)  methylPREDNISolone  sodium succinate (SOLU-MEDROL) 125 mg/2 mL injection 125 mg (125 mg Intravenous Given 07/07/20 2044)  cefTRIAXone (ROCEPHIN) 1 g in sodium chloride 0.9 % 100 mL IVPB (1 g Intravenous New Bag/Given 07/07/20 2047)    ED Course  I have reviewed the triage vital signs and the nursing notes.  Pertinent labs & imaging results that were available during my care of the patient were reviewed by me and considered in my medical decision making (see chart for details).    MDM Rules/Calculators/A&P                          Patient with history of CHF here for evaluation of shortness of breath, cough and dyspnea on exertion for the last several days. Patient in respiratory distress for EMS. On ED presentation she was improving on the CPAP and was transition to nasal cannula. She quickly developed recurrent respiratory distress and hypoxia required BiPAP. She was started on a Nitro drip for possible hypertensive urgency. Her hypertension did rapidly improve. On repeat assessment on the BiPAP she continues to appear improved. Chest x-ray with possible right lower lobe infiltrate. She was given Rocephin for community acquired pneumonia. She took azithromycin earlier today. She was also treated with Solu-Medrol for possible reactive airway component to her symptoms as well. Hospitalist consulted for admission for ongoing treatment. Final Clinical Impression(s) / ED Diagnoses Final diagnoses:  Community acquired pneumonia of right lower lobe of lung    Rx / DC Orders ED Discharge Orders    None       Quintella Reichert, MD 07/07/20 2306

## 2020-07-08 ENCOUNTER — Encounter (HOSPITAL_COMMUNITY): Payer: Medicare HMO

## 2020-07-08 ENCOUNTER — Encounter (HOSPITAL_COMMUNITY): Payer: Self-pay | Admitting: Internal Medicine

## 2020-07-08 ENCOUNTER — Observation Stay (HOSPITAL_COMMUNITY): Payer: Medicare HMO

## 2020-07-08 DIAGNOSIS — K2289 Other specified disease of esophagus: Secondary | ICD-10-CM | POA: Diagnosis not present

## 2020-07-08 DIAGNOSIS — F419 Anxiety disorder, unspecified: Secondary | ICD-10-CM | POA: Diagnosis present

## 2020-07-08 DIAGNOSIS — E041 Nontoxic single thyroid nodule: Secondary | ICD-10-CM | POA: Diagnosis present

## 2020-07-08 DIAGNOSIS — Z20822 Contact with and (suspected) exposure to covid-19: Secondary | ICD-10-CM | POA: Diagnosis present

## 2020-07-08 DIAGNOSIS — I161 Hypertensive emergency: Secondary | ICD-10-CM | POA: Diagnosis present

## 2020-07-08 DIAGNOSIS — E785 Hyperlipidemia, unspecified: Secondary | ICD-10-CM | POA: Diagnosis present

## 2020-07-08 DIAGNOSIS — I700 Atherosclerosis of aorta: Secondary | ICD-10-CM | POA: Diagnosis present

## 2020-07-08 DIAGNOSIS — I2510 Atherosclerotic heart disease of native coronary artery without angina pectoris: Secondary | ICD-10-CM | POA: Diagnosis present

## 2020-07-08 DIAGNOSIS — I379 Nonrheumatic pulmonary valve disorder, unspecified: Secondary | ICD-10-CM | POA: Diagnosis not present

## 2020-07-08 DIAGNOSIS — K222 Esophageal obstruction: Secondary | ICD-10-CM

## 2020-07-08 DIAGNOSIS — I378 Other nonrheumatic pulmonary valve disorders: Secondary | ICD-10-CM | POA: Diagnosis not present

## 2020-07-08 DIAGNOSIS — I130 Hypertensive heart and chronic kidney disease with heart failure and stage 1 through stage 4 chronic kidney disease, or unspecified chronic kidney disease: Secondary | ICD-10-CM | POA: Diagnosis present

## 2020-07-08 DIAGNOSIS — E7800 Pure hypercholesterolemia, unspecified: Secondary | ICD-10-CM

## 2020-07-08 DIAGNOSIS — J690 Pneumonitis due to inhalation of food and vomit: Secondary | ICD-10-CM | POA: Diagnosis present

## 2020-07-08 DIAGNOSIS — N181 Chronic kidney disease, stage 1: Secondary | ICD-10-CM | POA: Diagnosis present

## 2020-07-08 DIAGNOSIS — M1990 Unspecified osteoarthritis, unspecified site: Secondary | ICD-10-CM | POA: Diagnosis present

## 2020-07-08 DIAGNOSIS — K449 Diaphragmatic hernia without obstruction or gangrene: Secondary | ICD-10-CM | POA: Diagnosis present

## 2020-07-08 DIAGNOSIS — I5033 Acute on chronic diastolic (congestive) heart failure: Secondary | ICD-10-CM | POA: Diagnosis not present

## 2020-07-08 DIAGNOSIS — J810 Acute pulmonary edema: Secondary | ICD-10-CM | POA: Diagnosis not present

## 2020-07-08 DIAGNOSIS — J9600 Acute respiratory failure, unspecified whether with hypoxia or hypercapnia: Secondary | ICD-10-CM | POA: Diagnosis present

## 2020-07-08 DIAGNOSIS — F332 Major depressive disorder, recurrent severe without psychotic features: Secondary | ICD-10-CM

## 2020-07-08 DIAGNOSIS — M069 Rheumatoid arthritis, unspecified: Secondary | ICD-10-CM | POA: Diagnosis present

## 2020-07-08 DIAGNOSIS — G2581 Restless legs syndrome: Secondary | ICD-10-CM | POA: Diagnosis present

## 2020-07-08 DIAGNOSIS — F339 Major depressive disorder, recurrent, unspecified: Secondary | ICD-10-CM | POA: Diagnosis present

## 2020-07-08 DIAGNOSIS — J9601 Acute respiratory failure with hypoxia: Secondary | ICD-10-CM | POA: Diagnosis present

## 2020-07-08 DIAGNOSIS — I5021 Acute systolic (congestive) heart failure: Secondary | ICD-10-CM

## 2020-07-08 DIAGNOSIS — R54 Age-related physical debility: Secondary | ICD-10-CM | POA: Diagnosis present

## 2020-07-08 DIAGNOSIS — I5043 Acute on chronic combined systolic (congestive) and diastolic (congestive) heart failure: Secondary | ICD-10-CM | POA: Diagnosis present

## 2020-07-08 DIAGNOSIS — E1165 Type 2 diabetes mellitus with hyperglycemia: Secondary | ICD-10-CM | POA: Diagnosis present

## 2020-07-08 DIAGNOSIS — K2100 Gastro-esophageal reflux disease with esophagitis, without bleeding: Secondary | ICD-10-CM | POA: Diagnosis present

## 2020-07-08 LAB — GLUCOSE, CAPILLARY
Glucose-Capillary: 162 mg/dL — ABNORMAL HIGH (ref 70–99)
Glucose-Capillary: 157 mg/dL — ABNORMAL HIGH (ref 70–99)
Glucose-Capillary: 126 mg/dL — ABNORMAL HIGH (ref 70–99)
Glucose-Capillary: 125 mg/dL — ABNORMAL HIGH (ref 70–99)

## 2020-07-08 LAB — CBC
HCT: 32.9 % — ABNORMAL LOW (ref 36.0–46.0)
Hemoglobin: 10.6 g/dL — ABNORMAL LOW (ref 12.0–15.0)
MCH: 27.9 pg (ref 26.0–34.0)
MCHC: 32.2 g/dL (ref 30.0–36.0)
MCV: 86.6 fL (ref 80.0–100.0)
Platelets: 230 10*3/uL (ref 150–400)
RBC: 3.8 MIL/uL — ABNORMAL LOW (ref 3.87–5.11)
RDW: 14.1 % (ref 11.5–15.5)
WBC: 3.7 10*3/uL — ABNORMAL LOW (ref 4.0–10.5)
nRBC: 0 % (ref 0.0–0.2)

## 2020-07-08 LAB — MAGNESIUM: Magnesium: 1.6 mg/dL — ABNORMAL LOW (ref 1.7–2.4)

## 2020-07-08 LAB — BASIC METABOLIC PANEL
Anion gap: 11 (ref 5–15)
BUN: 11 mg/dL (ref 8–23)
CO2: 23 mmol/L (ref 22–32)
Calcium: 9.3 mg/dL (ref 8.9–10.3)
Chloride: 102 mmol/L (ref 98–111)
Creatinine, Ser: 0.71 mg/dL (ref 0.44–1.00)
GFR, Estimated: >60 mL/min (ref >60)
Glucose, Bld: 209 mg/dL — ABNORMAL HIGH (ref 70–99)
Potassium: 4.1 mmol/L (ref 3.5–5.1)
Sodium: 136 mmol/L (ref 135–145)

## 2020-07-08 LAB — ECHOCARDIOGRAM COMPLETE
Area-P 1/2: 4.15 cm2
Height: 60 in
MV VTI: 1.25 cm2
S' Lateral: 3.3 cm
Weight: 2543.23 oz

## 2020-07-08 LAB — MRSA PCR SCREENING: MRSA by PCR: POSITIVE — AB

## 2020-07-08 LAB — HEMOGLOBIN A1C
Hgb A1c MFr Bld: 5.7 % — ABNORMAL HIGH (ref 4.8–5.6)
Mean Plasma Glucose: 116.89 mg/dL

## 2020-07-08 LAB — PROCALCITONIN: Procalcitonin: <0.1 ng/mL

## 2020-07-08 MED ORDER — METOPROLOL TARTRATE 5 MG/5ML IV SOLN
7.5000 mg | Freq: Three times a day (TID) | INTRAVENOUS | Status: DC
  Administered 2020-07-08 – 2020-07-10 (×4): 7.5 mg via INTRAVENOUS
  Filled 2020-07-08 (×5): qty 10

## 2020-07-08 MED ORDER — MORPHINE SULFATE (PF) 2 MG/ML IV SOLN
2.0000 mg | Freq: Once | INTRAVENOUS | Status: AC
  Administered 2020-07-08: 2 mg via INTRAVENOUS
  Filled 2020-07-08: qty 1

## 2020-07-08 MED ORDER — FOLIC ACID 5 MG/ML IJ SOLN
1.0000 mg | Freq: Every day | INTRAMUSCULAR | Status: DC
  Administered 2020-07-08 – 2020-07-10 (×3): 1 mg via INTRAVENOUS
  Filled 2020-07-08 (×3): qty 0.2

## 2020-07-08 MED ORDER — FUROSEMIDE 10 MG/ML IJ SOLN
40.0000 mg | Freq: Every day | INTRAMUSCULAR | Status: DC
  Administered 2020-07-08 – 2020-07-09 (×2): 40 mg via INTRAVENOUS
  Filled 2020-07-08 (×2): qty 4

## 2020-07-08 MED ORDER — METOPROLOL TARTRATE 5 MG/5ML IV SOLN
5.0000 mg | Freq: Three times a day (TID) | INTRAVENOUS | Status: DC
  Administered 2020-07-08 (×2): 5 mg via INTRAVENOUS
  Filled 2020-07-08 (×2): qty 5

## 2020-07-08 MED ORDER — CHLORHEXIDINE GLUCONATE CLOTH 2 % EX PADS
6.0000 | MEDICATED_PAD | Freq: Every day | CUTANEOUS | Status: DC
  Administered 2020-07-08: 6 via TOPICAL

## 2020-07-08 MED ORDER — SODIUM CHLORIDE 0.9 % IV SOLN: 1.0000 mg | Freq: Every day | INTRAVENOUS | Status: DC

## 2020-07-08 MED ORDER — SODIUM CHLORIDE 0.9 % IV SOLN: 1.0000 g | INTRAVENOUS | Status: DC

## 2020-07-08 MED ORDER — ORAL CARE MOUTH RINSE
15.0000 mL | Freq: Two times a day (BID) | OROMUCOSAL | Status: DC
  Administered 2020-07-09: 15 mL via OROMUCOSAL

## 2020-07-08 MED ORDER — ESCITALOPRAM OXALATE 20 MG PO TABS: 20.0000 mg | ORAL_TABLET | Freq: Every day | ORAL | Status: DC

## 2020-07-08 MED ORDER — HYDROCODONE-ACETAMINOPHEN 5-325 MG PO TABS
1.0000 | ORAL_TABLET | Freq: Four times a day (QID) | ORAL | Status: DC | PRN
Start: 2020-07-08 — End: 2020-07-08

## 2020-07-08 MED ORDER — HYDROXYCHLOROQUINE SULFATE 200 MG PO TABS: 200.0000 mg | ORAL_TABLET | Freq: Every day | ORAL | Status: DC

## 2020-07-08 MED ORDER — POLYETHYLENE GLYCOL 3350 17 G PO PACK: 17.0000 g | PACK | Freq: Every day | ORAL | Status: DC | PRN

## 2020-07-08 MED ORDER — METOPROLOL SUCCINATE ER 25 MG PO TB24: 25.0000 mg | ORAL_TABLET | Freq: Every day | ORAL | Status: DC

## 2020-07-08 MED ORDER — SODIUM CHLORIDE 0.9 % IV SOLN: 1.0000 mg | Freq: Once | INTRAVENOUS | Status: DC

## 2020-07-08 MED ORDER — ORAL CARE MOUTH RINSE
15.0000 mL | Freq: Two times a day (BID) | OROMUCOSAL | Status: DC
  Administered 2020-07-08 – 2020-07-10 (×5): 15 mL via OROMUCOSAL

## 2020-07-08 MED ORDER — PIPERACILLIN-TAZOBACTAM 3.375 G IVPB 30 MIN: 3.3750 g | Freq: Four times a day (QID) | INTRAVENOUS | Status: DC

## 2020-07-08 MED ORDER — INSULIN ASPART 100 UNIT/ML ~~LOC~~ SOLN
0.0000 [IU] | SUBCUTANEOUS | Status: DC
  Administered 2020-07-08 – 2020-07-09 (×2): 1 [IU] via SUBCUTANEOUS

## 2020-07-08 MED ORDER — MORPHINE SULFATE (PF) 2 MG/ML IV SOLN
1.0000 mg | INTRAVENOUS | Status: DC | PRN
  Administered 2020-07-08 – 2020-07-10 (×5): 2 mg via INTRAVENOUS
  Filled 2020-07-08 (×5): qty 1

## 2020-07-08 MED ORDER — KETOROLAC TROMETHAMINE 30 MG/ML IJ SOLN
15.0000 mg | Freq: Four times a day (QID) | INTRAMUSCULAR | Status: DC | PRN
  Administered 2020-07-08: 15 mg via INTRAVENOUS
  Filled 2020-07-08: qty 1

## 2020-07-08 MED ORDER — MELATONIN 3 MG PO TABS: 6.0000 mg | ORAL_TABLET | Freq: Every day | ORAL | Status: DC

## 2020-07-08 MED ORDER — MORPHINE SULFATE (PF) 2 MG/ML IV SOLN
1.0000 mg | Freq: Once | INTRAVENOUS | Status: AC
  Administered 2020-07-08: 1 mg via INTRAVENOUS
  Filled 2020-07-08: qty 1

## 2020-07-08 MED ORDER — INSULIN ASPART 100 UNIT/ML ~~LOC~~ SOLN: 0.0000 [IU] | Freq: Three times a day (TID) | SUBCUTANEOUS | Status: DC

## 2020-07-08 MED ORDER — SODIUM CHLORIDE 0.9 % IV SOLN
500.0000 mg | INTRAVENOUS | Status: DC
  Administered 2020-07-08: 500 mg via INTRAVENOUS
  Filled 2020-07-08: qty 500

## 2020-07-08 MED ORDER — MUPIROCIN 2 % EX OINT
1.0000 "application " | TOPICAL_OINTMENT | Freq: Two times a day (BID) | CUTANEOUS | Status: DC
  Administered 2020-07-08 – 2020-07-10 (×5): 1 via NASAL
  Filled 2020-07-08 (×2): qty 22

## 2020-07-08 MED ORDER — PANTOPRAZOLE SODIUM 40 MG IV SOLR
40.0000 mg | Freq: Two times a day (BID) | INTRAVENOUS | Status: DC
  Administered 2020-07-08 – 2020-07-10 (×5): 40 mg via INTRAVENOUS
  Filled 2020-07-08 (×4): qty 40

## 2020-07-08 MED ORDER — METOPROLOL TARTRATE 25 MG PO TABS: 25.0000 mg | ORAL_TABLET | Freq: Two times a day (BID) | ORAL | Status: DC

## 2020-07-08 MED ORDER — HYDRALAZINE HCL 20 MG/ML IJ SOLN: 10.0000 mg | Freq: Four times a day (QID) | INTRAMUSCULAR | Status: DC | PRN

## 2020-07-08 MED ORDER — ROPINIROLE HCL 1 MG PO TABS: 5.0000 mg | ORAL_TABLET | Freq: Every day | ORAL | Status: DC

## 2020-07-08 MED ORDER — MAGNESIUM SULFATE 4 GM/100ML IV SOLN
4.0000 g | Freq: Once | INTRAVENOUS | Status: AC
  Administered 2020-07-08: 4 g via INTRAVENOUS
  Filled 2020-07-08: qty 100

## 2020-07-08 MED ORDER — LORAZEPAM 2 MG/ML IJ SOLN
1.0000 mg | Freq: Once | INTRAMUSCULAR | Status: AC
  Administered 2020-07-08: 1 mg via INTRAVENOUS
  Filled 2020-07-08: qty 1

## 2020-07-08 MED ORDER — FOLIC ACID 1 MG PO TABS: 1.0000 mg | ORAL_TABLET | Freq: Every day | ORAL | Status: DC

## 2020-07-08 MED ORDER — POTASSIUM CHLORIDE ER 10 MEQ PO TBCR: 10.0000 meq | EXTENDED_RELEASE_TABLET | Freq: Every day | ORAL | Status: DC

## 2020-07-08 MED ORDER — VITAMIN D3 25 MCG (1000 UNIT) PO TABS
1000.0000 [IU] | ORAL_TABLET | Freq: Every day | ORAL | Status: DC
  Administered 2020-07-09 – 2020-07-10 (×2): 1000 [IU] via ORAL
  Filled 2020-07-08 (×2): qty 1

## 2020-07-08 MED ORDER — ATORVASTATIN CALCIUM 10 MG PO TABS: 20.0000 mg | ORAL_TABLET | Freq: Every day | ORAL | Status: DC

## 2020-07-08 MED ORDER — CHLORHEXIDINE GLUCONATE CLOTH 2 % EX PADS
6.0000 | MEDICATED_PAD | Freq: Every day | CUTANEOUS | Status: DC
  Administered 2020-07-09 – 2020-07-10 (×2): 6 via TOPICAL

## 2020-07-08 MED ORDER — PIPERACILLIN-TAZOBACTAM 3.375 G IVPB
3.3750 g | Freq: Three times a day (TID) | INTRAVENOUS | Status: DC
  Administered 2020-07-08 – 2020-07-10 (×7): 3.375 g via INTRAVENOUS
  Filled 2020-07-08 (×8): qty 50

## 2020-07-08 MED ORDER — MUSCLE RUB 10-15 % EX CREA
TOPICAL_CREAM | CUTANEOUS | Status: DC | PRN
  Administered 2020-07-08: 1 via TOPICAL
  Filled 2020-07-08: qty 85

## 2020-07-08 MED ORDER — PANTOPRAZOLE SODIUM 40 MG PO TBEC: 40.0000 mg | DELAYED_RELEASE_TABLET | Freq: Two times a day (BID) | ORAL | Status: DC

## 2020-07-08 NOTE — H&P (View-Only) (Signed)
Reason for Consult: Difficulty swallowing. Referring Physician: [facility] hospitalist-Dr. Irine Seal  Chloe Harding is an 75 y.o. female.  HPI: Chloe Harding is a 75 year old white female with multiple medical problems listed below who came to the hospital yesterday with a cough and shortness of breath seems to be worsening over the last 2 weeks.  She called EMS as she could not get into see a physician over the weekend and was treated with BiPAP by EMT's.  Patient was noted to be hypertensive in the emergency room with sats 100% on the BiPAP with respiratory 25 with heart rate of 110/min she was negative for Covid by PCR and also negative for flu by PCR.  She seems to have improved since admission and is off the BiPAP now.  A CT angiogram done on admission revealed chronic elevation of the right hemidiaphragm with volume loss with no evidence of pneumonia and large amount of debris in the dilated esophagus along with aortic atherosclerosis and coronary artery calcifications. Patient has been n.p.o. since admission is requesting to eat some soft foods.  She has a history of esophagus and esophageal stricture dilated 01/25/2020 by she was unable to go back for repeat dilatation in December of last year she had a complicated UTI.  She denies having any abdominal pain or chest pain, nausea or vomiting.  A GI consultation was requested for an endoscopy with dilatation as it was possible.  Past Medical History:  Diagnosis Date  . Aortic atherosclerosis ([place])   . Arthritis   . Back pain 02/23/2020  . Chronic combined systolic and diastolic CHF (congestive heart failure) ([place])   . Coronary atherosclerosis   . Depression   . Diabetes mellitus   . Esophageal stricture   . Fall   . Hiatal hernia   . Hypercholesteremia   . Hypertension   . Obesity   . Pneumonia   . Reflux   . Renal disorder    shutting down 4 years ago  . Subdural hematoma ([place])   . Thyroid nodule   . Urinary retention   . UTI due to  Klebsiella species    Past Surgical History:  Procedure Laterality Date  . BALLOON DILATION N/A 01/25/2020   Procedure: BALLOON DILATION;  Surgeon: Carol Ada, MD;  Location: [facility];  Service: Endoscopy;  Laterality: N/A;  . CHOLECYSTECTOMY    . COLONOSCOPY WITH PROPOFOL N/A 05/20/2017   Procedure: COLONOSCOPY WITH PROPOFOL;  Surgeon: Carol Ada, MD;  Location: [facility];  Service: Endoscopy;  Laterality: N/A;  . ESOPHAGOGASTRODUODENOSCOPY N/A 05/19/2017   Procedure: ESOPHAGOGASTRODUODENOSCOPY (EGD);  Surgeon: Juanita Craver, MD;  Location: [facility] ENDOSCOPY;  Service: Endoscopy;  Laterality: N/A;  . ESOPHAGOGASTRODUODENOSCOPY (EGD) WITH PROPOFOL N/A 01/17/2019   Procedure: ESOPHAGOGASTRODUODENOSCOPY (EGD) WITH PROPOFOL;  Surgeon: Carol Ada, MD;  Location: [facility];  Service: Endoscopy;  Laterality: N/A;  . ESOPHAGOGASTRODUODENOSCOPY (EGD) WITH PROPOFOL N/A 01/25/2020   Procedure: ESOPHAGOGASTRODUODENOSCOPY (EGD) WITH PROPOFOL;  Surgeon: Carol Ada, MD;  Location: [facility];  Service: Endoscopy;  Laterality: N/A;  . SAVORY DILATION N/A 01/17/2019   Procedure: SAVORY DILATION;  Surgeon: Carol Ada, MD;  Location: [facility] END[facility];  Service: Endoscopy;  Laterality: N/A;   Family History  Problem Relation Age of Onset  . Heart attack Sister   . Heart failure Sister   . Heart attack Brother   . Heart failure Brother    Social History:  reports that she has never smoked. She has never used smokeless tobacco. She reports that she does  not drink alcohol and does not use drugs.  Allergies:  Allergies  Allergen Reactions  . Amlodipine Besylate Swelling    swelling   Medications: I have reviewed the patient's current medications.  Results for orders placed or performed during the hospital encounter of 07/07/20 (from the past 48 hour(s))  Resp Panel by RT-PCR (Flu A&B, Covid) Nasopharyngeal Swab     Status: None   Collection Time: 07/07/20  6:26 PM   Specimen: Nasopharyngeal Swab;  Nasopharyngeal(NP) swabs in vial transport medium  Result Value Ref Range   SARS Coronavirus 2 by RT PCR NEGATIVE NEGATIVE    Comment: (NOTE) SARS-CoV-2 target nucleic acids are NOT DETECTED.  The SARS-CoV-2 RNA is generally detectable in upper respiratory specimens during the acute phase of infection. The lowest concentration of SARS-CoV-2 viral copies this assay can detect is 138 copies/mL. A negative result does not preclude SARS-Cov-2 infection and should not be used as the sole basis for treatment or other patient management decisions. A negative result may occur with  improper specimen collection/handling, submission of specimen other than nasopharyngeal swab, presence of viral mutation(s) within the areas targeted by this assay, and inadequate number of viral copies(<138 copies/mL). A negative result must be combined with clinical observations, patient history, and epidemiological information. The expected result is Negative.  Fact Sheet for Patients:  EntrepreneurPulse.com.au  Fact Sheet for Healthcare Providers:  IncredibleEmployment.be  This test is no t yet approved or cleared by the [place] FDA and  has been authorized for detection and/or diagnosis of SARS-CoV-2 by FDA under an Emergency Use Authorization (EUA). This EUA will remain  in effect (meaning this test can be used) for the duration of the COVID-19 declaration under Section 564(b)(1) of the Act, 21 U.S.C.section 360bbb-3(b)(1), unless the authorization is terminated  or revoked sooner.       Influenza A by PCR NEGATIVE NEGATIVE   Influenza B by PCR NEGATIVE NEGATIVE    Comment: (NOTE) The Xpert Xpress SARS-CoV-2/FLU/RSV plus assay is intended as an aid in the diagnosis of influenza from Nasopharyngeal swab specimens and should not be used as a sole basis for treatment. Nasal washings and aspirates are unacceptable for Xpert Xpress  SARS-CoV-2/FLU/RSV testing.  Fact Sheet for Patients: EntrepreneurPulse.com.au  Fact Sheet for Healthcare Providers: IncredibleEmployment.be  This test is not yet approved or cleared by the [place] FDA and has been authorized for detection and/or diagnosis of SARS-CoV-2 by FDA under an Emergency Use Authorization (EUA). This EUA will remain in effect (meaning this test can be used) for the duration of the COVID-19 declaration under Section 564(b)(1) of the Act, 21 U.S.C. section 360bbb-3(b)(1), unless the authorization is terminated or revoked.  Performed at [facility], [place] [address], [place], [place] [address]   Comprehensive metabolic panel     Status: Abnormal   Collection Time: 07/07/20  7:30 PM  Result Value Ref Range   Sodium 137 135 - 145 mmol/L   Potassium 4.1 3.5 - 5.1 mmol/L   Chloride 102 98 - 111 mmol/L   CO2 24 22 - 32 mmol/L   Glucose, Bld 180 (H) 70 - 99 mg/dL    Comment: Glucose reference range applies only to samples taken after fasting for at least 8 hours.   BUN 10 8 - 23 mg/dL   Creatinine, Ser 0.64 0.44 - 1.00 mg/dL   Calcium 9.0 8.9 - 10.3 mg/dL   Total Protein 7.4 6.5 - 8.1 g/dL   Albumin 3.8 3.5 - 5.0  g/dL   AST 17 15 - 41 U/L   ALT 8 0 - 44 U/L   Alkaline Phosphatase 92 38 - 126 U/L   Total Bilirubin 0.7 0.3 - 1.2 mg/dL   GFR, Estimated >60 >60 mL/min    Comment: (NOTE) Calculated using the CKD-EPI Creatinine Equation (2021)    Anion gap 11 5 - 15    Comment: Performed at [facility], [place] [address], [place], [place] [address]  CBC with Differential     Status: Abnormal   Collection Time: 07/07/20  7:30 PM  Result Value Ref Range   WBC 7.2 4.0 - 10.5 K/uL   RBC 3.91 3.87 - 5.11 MIL/uL   Hemoglobin 10.8 (L) 12.0 - 15.0 g/dL   HCT 34.3 (L) 36.0 - 46.0 %   MCV 87.7 80.0 - 100.0 fL   MCH 27.6 26.0 - 34.0 pg   MCHC 31.5 30.0 - 36.0 g/dL   RDW 14.1 11.5 - 15.5 %    Platelets 247 150 - 400 K/uL   nRBC 0.0 0.0 - 0.2 %   Neutrophils Relative % 57 %   Neutro Abs 4.1 1.7 - 7.7 K/uL   Lymphocytes Relative 21 %   Lymphs Abs 1.5 0.7 - 4.0 K/uL   Monocytes Relative 5 %   Monocytes Absolute 0.4 0.1 - 1.0 K/uL   Eosinophils Relative 16 %   Eosinophils Absolute 1.2 (H) 0.0 - 0.5 K/uL   Basophils Relative 1 %   Basophils Absolute 0.1 0.0 - 0.1 K/uL   Immature Granulocytes 0 %   Abs Immature Granulocytes 0.02 0.00 - 0.07 K/uL    Comment: Performed at [facility], [place] [address], [place], [place] [address]  Troponin I (High Sensitivity)     Status: None   Collection Time: 07/07/20  7:30 PM  Result Value Ref Range   Troponin I (High Sensitivity) 12 <18 ng/L    Comment: (NOTE) Elevated high sensitivity troponin I (hsTnI) values and significant  changes across serial measurements may suggest ACS but many other  chronic and acute conditions are known to elevate hsTnI results.  Refer to the "Links" section for chest pain algorithms and additional  guidance. Performed at [facility], [place] [address] [place], [place] [address]   Brain natriuretic peptide     Status: Abnormal   Collection Time: 07/07/20  7:30 PM  Result Value Ref Range   B Natriuretic Peptide 381.5 (H) 0.0 - 100.0 pg/mL    Comment: Performed at [facility], [place] [address] [place], [place] [address]  Type and screen [place]     Status: None   Collection Time: 07/07/20  7:30 PM  Result Value Ref Range   ABO/RH(D) O POS    Antibody Screen NEG    Sample Expiration      07/10/2020,2359 Performed at [place] [address], [place],  [address]   I-stat chem 8, ED (not at [facility] or [facility])     Status: Abnormal   Collection Time: 07/07/20  7:46 PM  Result Value Ref Range   Sodium 138 135 - 145 mmol/L   Potassium 4.1 3.5 - 5.1 mmol/L   Chloride 102 98 - 111 mmol/L   BUN 8 8 - 23  mg/dL   Creatinine, Ser 0.60 0.44 - 1.00 mg/dL   Glucose, Bld 181 (H) 70 - 99 mg/dL    Comment: Glucose reference range applies only to samples taken  after fasting for at least 8 hours.   Calcium, Ion 1.20 1.15 - 1.40 mmol/L   TCO2 26 22 - 32 mmol/L   Hemoglobin 11.6 (L) 12.0 - 15.0 g/dL   HCT 34.0 (L) 36.0 - 46.0 %  Lactic acid, plasma     Status: None   Collection Time: 07/07/20  8:09 PM  Result Value Ref Range   Lactic Acid, Venous 1.3 0.5 - 1.9 mmol/L    Comment: Performed at [facility], [place] [address] [place], [place] [address]  Culture, blood (routine x 2)     Status: None (Preliminary result)   Collection Time: 07/07/20  8:20 PM   Specimen: BLOOD RIGHT FOREARM  Result Value Ref Range   Specimen Description      BLOOD RIGHT FOREARM Performed at [facility], [place] [address], [place], [place] [address]    Special Requests      BOTTLES DRAWN AEROBIC AND ANAEROBIC Blood Culture adequate volume Performed at [place] [address], [place], [place] [address]    Culture PENDING    Report Status PENDING   Troponin I (High Sensitivity)     Status: None   Collection Time: 07/07/20  8:37 PM  Result Value Ref Range   Troponin I (High Sensitivity) 17 <18 ng/L    Comment: (NOTE) Elevated high sensitivity troponin I (hsTnI) values and significant  changes across serial measurements may suggest ACS but many other  chronic and acute conditions are known to elevate hsTnI results.  Refer to the "Links" section for chest pain algorithms and additional  guidance. Performed at [facility], [place] [address] [place], [place] [address]   Blood gas, venous     Status: Abnormal   Collection Time: 07/07/20  8:37 PM  Result Value Ref Range   FIO2 21.00    pH, Ven 7.274 7.250 - 7.430   pCO2, Ven 60.0 44.0 - 60.0 mmHg   pO2, Ven 31.2 (LL) 32.0 - 45.0 mmHg    Comment: CRITICAL RESULT CALLED TO, READ BACK BY AND VERIFIED  WITH: Marina Gravel RN 07/07/20 @ 2257 BY P.HENDERSON    Bicarbonate 26.9 20.0 - 28.0 mmol/L   Acid-base deficit 0.1 0.0 - 2.0 mmol/L   O2 Saturation 47.9 %   Patient temperature 98.6     Comment: Performed at [facility], [place] [address], [place], [place] [address]  D-dimer, quantitative     Status: Abnormal   Collection Time: 07/07/20  8:37 PM  Result Value Ref Range   D-Dimer, Quant 1.11 (H) 0.00 - 0.50 ug/mL-FEU    Comment: (NOTE) At the manufacturer cut-off value of 0.5 g/mL FEU, this assay has a negative predictive value of 95-100%.This assay is intended for use in conjunction with a clinical pretest probability (PTP) assessment model to exclude pulmonary embolism (PE) and deep venous thrombosis (DVT) in outpatients suspected of PE or DVT. Results should be correlated with clinical presentation. Performed at [facility], [place] [address] [place], [place] [address]   MRSA PCR Screening     Status: Abnormal   Collection Time: 07/08/20 12:52 AM   Specimen: Nasal Mucosa; Nasopharyngeal  Result Value Ref Range   MRSA by PCR POSITIVE (A) NEGATIVE    Comment:        The GeneXpert MRSA Assay (FDA approved for NASAL specimens only), is one component of a comprehensive MRSA colonization surveillance program. It is not intended to diagnose MRSA infection nor  to guide or monitor treatment for MRSA infections. RESULT CALLED TO, READ BACK BY AND VERIFIED WITH: RN PENG 07/08/20 @0301  BY P.HENDERSON Performed at [facility], [place] [address], [place], [place] [address]   Procalcitonin - Baseline     Status: None   Collection Time: 07/08/20  2:15 AM  Result Value Ref Range   Procalcitonin <0.10 ng/mL    Comment:        Interpretation: PCT (Procalcitonin) <= 0.5 ng/mL: Systemic infection (sepsis) is not likely. Local bacterial infection is possible. (NOTE)       Sepsis PCT Algorithm           Lower Respiratory Tract                                       Infection PCT Algorithm    ----------------------------     ----------------------------         PCT < 0.25 ng/mL                PCT < 0.10 ng/mL          Strongly encourage             Strongly discourage   discontinuation of antibiotics    initiation of antibiotics    ----------------------------     -----------------------------       PCT 0.25 - 0.50 ng/mL            PCT 0.10 - 0.25 ng/mL               OR       >80% decrease in PCT            Discourage initiation of                                            antibiotics      Encourage discontinuation           of antibiotics    ----------------------------     -----------------------------         PCT >= 0.50 ng/mL              PCT 0.26 - 0.50 ng/mL               AND        <80% decrease in PCT             Encourage initiation of                                             antibiotics       Encourage continuation           of antibiotics    ----------------------------     -----------------------------        PCT >= 0.50 ng/mL                  PCT > 0.50 ng/mL               AND         increase in PCT  Strongly encourage                                      initiation of antibiotics    Strongly encourage escalation           of antibiotics                                     -----------------------------                                           PCT <= 0.25 ng/mL                                                 OR                                        > 80% decrease in PCT                                      Discontinue / Do not initiate                                             antibiotics  Performed at [place] [address] [place], [place] [address]   Magnesium     Status: Abnormal   Collection Time: 07/08/20  2:15 AM  Result Value Ref Range   Magnesium 1.6 (L) 1.7 - 2.4 mg/dL    Comment: Performed at [facility], [place] [address] [place], [place] [address]  Basic metabolic panel     Status: Abnormal   Collection Time: 07/08/20  2:15 AM  Result Value Ref Range   Sodium 136 135 - 145 mmol/L   Potassium 4.1 3.5 - 5.1 mmol/L   Chloride 102 98 - 111 mmol/L   CO2 23 22 - 32 mmol/L   Glucose, Bld 209 (H) 70 - 99 mg/dL    Comment: Glucose reference range applies only to samples taken after fasting for at least 8 hours.   BUN 11 8 - 23 mg/dL   Creatinine, Ser 0.71 0.44 - 1.00 mg/dL   Calcium 9.3 8.9 - 10.3 mg/dL   GFR, Estimated >60 >60 mL/min    Comment: (NOTE) Calculated using the CKD-EPI Creatinine Equation (2021)    Anion gap 11 5 - 15    Comment: Performed at [facility], [place] [address] [place], [place] [address]  CBC     Status: Abnormal   Collection Time: 07/08/20  2:15 AM  Result Value Ref Range   WBC 3.7 (L) 4.0 - 10.5 K/uL   RBC 3.80 (L) 3.87 - 5.11 MIL/uL   Hemoglobin 10.6 (L) 12.0 - 15.0 g/dL   HCT 32.9 (L) 36.0 - 46.0 %   MCV  86.6 80.0 - 100.0 fL   MCH 27.9 26.0 - 34.0 pg   MCHC 32.2 30.0 - 36.0 g/dL   RDW 14.1 11.5 - 15.5 %   Platelets 230 150 - 400 K/uL   nRBC 0.0 0.0 - 0.2 %    Comment: Performed at [facility], [place] [address] [place], [place] [address]  Hemoglobin A1c     Status: Abnormal   Collection Time: 07/08/20  2:15 AM  Result Value Ref Range   Hgb A1c MFr Bld 5.7 (H) 4.8 - 5.6 %    Comment: (NOTE) Pre diabetes:          5.7%-6.4%  Diabetes:              >6.4%  Glycemic control for   <7.0% adults with diabetes    Mean Plasma Glucose 116.89 mg/dL    Comment: Performed at [facility] [address], [place], [place] [address]  Glucose, capillary     Status: Abnormal   Collection Time: 07/08/20 10:15 AM  Result Value Ref Range   Glucose-Capillary 162 (H) 70 - 99 mg/dL    Comment: Glucose reference range applies only to samples taken after fasting for at least 8 hours.  Glucose, capillary     Status: Abnormal    Collection Time: 07/08/20 11:49 AM  Result Value Ref Range   Glucose-Capillary 157 (H) 70 - 99 mg/dL    Comment: Glucose reference range applies only to samples taken after fasting for at least 8 hours.  Glucose, capillary     Status: Abnormal   Collection Time: 07/08/20  4:19 PM  Result Value Ref Range   Glucose-Capillary 126 (H) 70 - 99 mg/dL    Comment: Glucose reference range applies only to samples taken after fasting for at least 8 hours.   CT ANGIO CHEST PE W OR WO CONTRAST  Result Date: 07/08/2020 CLINICAL DATA:  Positive D-dimer.  Shortness of breath. EXAM: CT ANGIOGRAPHY CHEST WITH CONTRAST TECHNIQUE: Multidetector CT imaging of the chest was performed using the standard protocol during bolus administration of intravenous contrast. Multiplanar CT image reconstructions and MIPs were obtained to evaluate the vascular anatomy. CONTRAST:  191mL OMNIPAQUE IOHEXOL 350 MG/ML SOLN COMPARISON:  Chest radiography yesterday. Previous CT angiography 11/05/2019 FINDINGS: Cardiovascular: Heart size is normal. Coronary artery calcification is present. Aortic atherosclerotic calcification is present. Pulmonary arterial opacification is good. There are no pulmonary emboli. Mediastinum/Nodes: The esophagus is dilated and full of fluid, air and bowel contents. No mediastinal mass or lymphadenopathy. Lungs/Pleura: Mild emphysema at the lung apices. On the left, there is no pleural fluid. There is minimal atelectasis or scar at the left base. On the right, there is a very tiny amount of pleural fluid layering dependently. 5 mm nodule at the apex is unchanged. Chronic elevation of the right hemidiaphragm with volume loss and or pneumonia in the right lower lobe. This is more pronounced than was seen in Nov 21, 2019. Upper Abdomen: Small hiatal hernia. No acute upper abdominal finding. Musculoskeletal: Ordinary chronic thoracic degenerative changes. Review of the MIP images confirms the above findings. IMPRESSION: 1. No  pulmonary emboli. 2. Chronic elevation of the right hemidiaphragm with volume loss and/or pneumonia in the right lower lobe. This is more pronounced than was seen in 11/21/19. 3. Dilated esophagus with fluid, air and bowel contents. 4. Aortic atherosclerosis. Coronary artery calcification. Aortic Atherosclerosis (ICD10-I70.0) and Emphysema (ICD10-J43.9). Electronically Signed   By: Nelson Chimes M.D.   On:  07/08/2020 00:51   DG Chest Port 1 View  Result Date: 07/07/2020 CLINICAL DATA:  Shortness of breath. EXAM: PORTABLE CHEST 1 VIEW COMPARISON:  February 15, 2020 FINDINGS: Mild new opacity in the right base. The lungs are otherwise clear. The cardiomediastinal silhouette is stable and unremarkable. No pneumothorax. IMPRESSION: Mild right basilar opacity could represent atelectasis or developing infection. Recommend short-term follow-up imaging to ensure resolution. Electronically Signed   By: Dorise Bullion III M.D   On: 07/07/2020 19:09   Review of Systems  Constitutional: Positive for fatigue. Negative for appetite change, chills, diaphoresis, fever and unexpected weight change.  HENT: Negative.   Eyes: Negative.   Respiratory: Positive for cough, choking and shortness of breath. Negative for apnea, chest tightness, wheezing and stridor.   Cardiovascular: Negative.   Gastrointestinal: Negative.   Endocrine: Negative.   Genitourinary: Negative.   Musculoskeletal: Positive for arthralgias and joint swelling.  Skin: Negative.   Allergic/Immunologic: Negative.   Neurological: Negative.   Hematological: Negative.   Psychiatric/Behavioral: Negative.    Blood pressure (!) 172/79, pulse (!) 104, temperature 97.9 F (36.6 C), temperature source Oral, resp. rate 16, height 5' (1.524 m), weight 72.1 kg, SpO2 99 %. Physical Exam Constitutional:      General: She is not in acute distress.    Appearance: She is well-developed.  HENT:     Head: Normocephalic and atraumatic.  Eyes:      Extraocular Movements: Extraocular movements intact.  Cardiovascular:     Rate and Rhythm: Normal rate.  Pulmonary:     Effort: Pulmonary effort is normal.     Breath sounds: Normal breath sounds. No wheezing or rales.  Abdominal:     General: Bowel sounds are normal.     Palpations: Abdomen is soft.  Musculoskeletal:     Cervical back: Normal range of motion.  Skin:    General: Skin is warm and dry.  Neurological:     General: No focal deficit present.     Mental Status: She is alert and oriented to person, place, and time.  Psychiatric:        Mood and Affect: Mood normal.        Behavior: Behavior normal.   Assessment/Plan: 1) Acute respiratory compromise secondary to flash pulmonary edema/acute on chronic combined systolic and diastolic heart failure. Patient is stable off the BiPAP and on 2 L of oxygen by nasal cannula. There is a questionable basilar atelectasis versus pneumonia but patient does not have any fever chills or leukocytosis I think she might be stable to proceed at this time.  She is on Zosyn for possible aspiration pneumonia. 2) Abnormal abdominal CT angiogram with dilated esophagus filled with fluid and bowel contents-an EGD with possible dilation patient is scheduled for tomorrow. Patient with history of GERD and a hiatal hernia. 3) Hypertensive emergency-elevated bloodpressures in the ER. Now on Lasix and Lopressor. 4) Hyperlipidemia.  5)Rheumatoidrthritis. 6) Dression/anxiety. 7) Hyperglycemia/hypomagnesemia.  Juanita Craver 07/08/2020, 5:04 PM

## 2020-07-08 NOTE — Progress Notes (Signed)
PROGRESS NOTE    Chloe Harding  OJJ:[id number] DOB: 01/23/1946 DOA: 07/07/2020 PCP: [name], NP   Chief Complaint  Patient presents with  . Shortness of Breath    Brief Narrative: Patient is a 75 year old female history of chronic combined systolic and diastolic CHF (EF 45 to 93%, G1 DD, LV global hypokinesis.  TTE 11/10/2019), hypertension, hyperlipidemia, rheumatoid arthritis, depression, dysphagia with history of esophageal stenosis status post dilatation 01/25/2020 presented to the ED for evaluation of shortness of breath.  Patient noted to have had progressive shortness of breath x2 weeks and now occurring at rest and in the supine position.  Patient with sudden severe worsening shortness of breath called EMS placed on a BiPAP brought to the ED.  Patient seen in the ED noted to be in hypertensive emergency with blood pressure 197/84, sats of 100% on BiPAP, respiratory rate of 25, tachycardic with a pulse of 110.  SARS coronavirus 2 PCR negative.  Influenza A and B PCR negative.  Patient placed on BiPAP with some improvement in respiratory status.  BNP elevated at 381.5.  Patient given a dose of IV Lasix in the ED, IV Solu-Medrol, IV nitroglycerin drip for improvement with blood pressure. CT angiogram chest ordered which was negative for PE, chronic elevation of right hemidiaphragm with volume loss and no pneumonia in the right lower lobe more pronounced than seen in 10-Nov-2019, dilated esophagus with fluid, and bowel contents.  Aortic atherosclerosis.  Coronary artery calcification. Patient admitted and placed in the stepdown unit.   Assessment & Plan:   Principal Problem:   Acute respiratory failure (HCC) Active Problems:   Major depression, recurrent ([place])   GERD   Pure hypercholesterolemia   Esophageal stricture   Hypertensive emergency   Acute on chronic combined systolic and diastolic CHF (congestive heart failure) (HCC)   Aspiration pneumonia (HCC)   Esophageal  dilatation  1 acute respiratory failure likely multifactorial secondary to probable aspiration pneumonia and flash pulmonary edema/acute on chronic combined systolic and diastolic heart failure Patient presenting in acute respiratory failure noted to be in hypertensive urgency and initially placed on the BiPAP.  Patient noted to have had some improvement in respiratory symptoms on BiPAP currently on 2 L nasal cannula.  Chest x-ray done on admission concern for left base atelectasis versus developing infiltrate.  CT angiogram chest done due to elevated D-dimer and tachycardia was negative for PE however concerning for dilated esophagus with fluid and bowel contents.  Patient likely with an aspiration event.  Patient with history of esophageal stricture.  Discontinue BiPAP.  EKG with normal sinus rhythm.  Cardiac enzymes negative.  Check a 2D echo.  Place on Lasix 40 mg IV daily.  Strict I's and O's.  Daily weights.  Keep n.p.o.  Consult with GI and cardiology for further evaluation and management.  Follow.  2.  Dilated esophagus with fluid and bowel contents Noted on CT angiogram chest.  Patient with a history of chronic dysphagia and esophageal stricture status post dilatation 01/25/2020.  Patient at that time noted to have had an esophageal ulcer and placed on PPI twice daily which she states she is compliant with.  Keep patient n.p.o.  Placed on PPI 40 mg IV every 12 hours.  Consult with GI for further evaluation and management.  Likely needs another dilatation.  Follow.  3.  Hypertensive emergency Patient noted to have hypertensive urgency with elevated blood pressure.  Patient initially placed on nitroglycerin infusion which has been discontinued.  Patient received a dose of Lasix 40 mg IV x1.  Placed on Lasix 40 mg IV daily due to problem #1.  Placed on Lopressor 5 mg IV every 8 hours.  IV hydralazine as needed.  If further blood pressure control is needed may consider placing on IV enalapril.  Consult  with cardiology.  4.  Hyperlipidemia Continue to hold statin.  5.  Probable aspiration pneumonia Chest x-ray done on admission concerning for right base atelectasis versus developing infection.  CT angiogram chest done with dilated esophagus with fluid and bowel contents noted, chronic elevation of right hemidiaphragm with volume loss and no pneumonia in the right lower lobe.  Keep n.p.o.  Change IV antibiotics to IV Zosyn.  Consult with GI secondary to concern for esophageal stricture.  Follow.  6.  Rheumatoid arthritis On Plaquenil as outpatient which we will hold for now.  Resume once cleared by GI for oral intake.  7.  Depression/anxiety Hold Lexapro until patient able to resume oral intake.  8.  Restless leg syndrome Resume ropinirole once patient able to take oral intake and cleared by GI.  9.  Hypomagnesemia Magnesium sulfate 4 g IV x1.  Repeat labs in the morning.  10.  Hyperglycemia ??  History of diabetes mellitus.  Hemoglobin A1c 01/29/2020 at 4.6.  Patient not on any diabetic medications on med rec.  Check a hemoglobin A1c.  Sliding scale insulin.  Follow.     DVT prophylaxis: Lovenox Code Status: Full Family Communication: Updated patient and husband Liliane Channel at bedside. Disposition:   Status is: Observation    Dispo: The patient is from: Home              Anticipated d/c is to: Home              Anticipated d/c date is: 3 to 4 days.              Patient currently n.p.o., concern for esophageal stricture, acute CHF exacerbation, not stable for discharge.   Difficult to place patient no       Consultants:   Cardiology pending  Gastroenterology pending  Procedures:   Chest x-ray 07/07/2020  CT angiogram chest 07/08/2020  Antimicrobials:   IV Rocephin 07/07/2020>>>> 07/08/2020  IV azithromycin 2/21/ 2022x1 dose  IV Zosyn 07/08/2020>>>>>   Subjective: Patient sitting up in chair.  States improvement with shortness of breath since admission.  Currently  off BiPAP.  Some midsternal chest pain/discomfort.  Complaining of chronic lower extremity pain and restless legs.  Husband at bedside.  Objective: Vitals:   07/08/20 0400 07/08/20 0401 07/08/20 0642 07/08/20 0800  BP:  (!) 167/96    Pulse: (!) 101 98 (!) 104   Resp: 14 14 16    Temp: 97.6 F (36.4 C)   (!) 97.5 F (36.4 C)  TempSrc: Axillary   Oral  SpO2: 100% 100% 99%   Weight:      Height:        Intake/Output Summary (Last 24 hours) at 07/08/2020 1049 Last data filed at 07/08/2020 0400 Gross per 24 hour  Intake 249.78 ml  Output 1300 ml  Net -1050.22 ml   Filed Weights   07/08/20 0100  Weight: 72.1 kg    Examination:  General exam: Appears calm and comfortable  Respiratory system: Bibasilar crackles.  No wheezing.  Fair air movement.  Speaking in full sentences.  Cardiovascular system: S1 & S2 heard, RRR. No JVD, murmurs, rubs, gallops or clicks. No pedal edema. Gastrointestinal system:  Abdomen is nondistended, soft and nontender. No organomegaly or masses felt. Normal bowel sounds heard. Central nervous system: Alert and oriented. No focal neurological deficits. Extremities: Symmetric 5 x 5 power. Skin: No rashes, lesions or ulcers Psychiatry: Judgement and insight appear normal. Mood & affect appropriate.     Data Reviewed: I have personally reviewed following labs and imaging studies  CBC: Recent Labs  Lab 07/07/20 1930 07/07/20 1946 07/08/20 0215  WBC 7.2  --  3.7*  NEUTROABS 4.1  --   --   HGB 10.8* 11.6* 10.6*  HCT 34.3* 34.0* 32.9*  MCV 87.7  --  86.6  PLT 247  --  017    Basic Metabolic Panel: Recent Labs  Lab 07/07/20 1930 07/07/20 1946 07/08/20 0215  NA 137 138 136  K 4.1 4.1 4.1  CL 102 102 102  CO2 24  --  23  GLUCOSE 180* 181* 209*  BUN 10 8 11   CREATININE 0.64 0.60 0.71  CALCIUM 9.0  --  9.3  MG  --   --  1.6*    GFR: Estimated Creatinine Clearance: 55.5 mL/min (by C-G formula based on SCr of 0.71 mg/dL).  Liver Function  Tests: Recent Labs  Lab 07/07/20 1930  AST 17  ALT 8  ALKPHOS 92  BILITOT 0.7  PROT 7.4  ALBUMIN 3.8    CBG: Recent Labs  Lab 07/08/20 1015  GLUCAP 162*     Recent Results (from the past 240 hour(s))  Resp Panel by RT-PCR (Flu A&B, Covid) Nasopharyngeal Swab     Status: None   Collection Time: 07/07/20  6:26 PM   Specimen: Nasopharyngeal Swab; Nasopharyngeal(NP) swabs in vial transport medium  Result Value Ref Range Status   SARS Coronavirus 2 by RT PCR NEGATIVE NEGATIVE Final    Comment: (NOTE) SARS-CoV-2 target nucleic acids are NOT DETECTED.  The SARS-CoV-2 RNA is generally detectable in upper respiratory specimens during the acute phase of infection. The lowest concentration of SARS-CoV-2 viral copies this assay can detect is 138 copies/mL. A negative result does not preclude SARS-Cov-2 infection and should not be used as the sole basis for treatment or other patient management decisions. A negative result may occur with  improper specimen collection/handling, submission of specimen other than nasopharyngeal swab, presence of viral mutation(s) within the areas targeted by this assay, and inadequate number of viral copies(<138 copies/mL). A negative result must be combined with clinical observations, patient history, and epidemiological information. The expected result is Negative.  Fact Sheet for Patients:  EntrepreneurPulse.com.au  Fact Sheet for Healthcare Providers:  IncredibleEmployment.be  This test is no t yet approved or cleared by the [place] FDA and  has been authorized for detection and/or diagnosis of SARS-CoV-2 by FDA under an Emergency Use Authorization (EUA). This EUA will remain  in effect (meaning this test can be used) for the duration of the COVID-19 declaration under Section 564(b)(1) of the Act, 21 U.S.C.section 360bbb-3(b)(1), unless the authorization is terminated  or revoked sooner.        Influenza A by PCR NEGATIVE NEGATIVE Final   Influenza B by PCR NEGATIVE NEGATIVE Final    Comment: (NOTE) The Xpert Xpress SARS-CoV-2/FLU/RSV plus assay is intended as an aid in the diagnosis of influenza from Nasopharyngeal swab specimens and should not be used as a sole basis for treatment. Nasal washings and aspirates are unacceptable for Xpert Xpress SARS-CoV-2/FLU/RSV testing.  Fact Sheet for Patients: EntrepreneurPulse.com.au  Fact Sheet for Healthcare Providers: IncredibleEmployment.be  This test  is not yet approved or cleared by the [place] and has been authorized for detection and/or diagnosis of SARS-CoV-2 by FDA under an Emergency Use Authorization (EUA). This EUA will remain in effect (meaning this test can be used) for the duration of the COVID-19 declaration under Section 564(b)(1) of the Act, 21 U.S.C. section 360bbb-3(b)(1), unless the authorization is terminated or revoked.  Performed at [facility], [place] [address], [place], [place] [address]   Culture, blood (routine x 2)     Status: None (Preliminary result)   Collection Time: 07/07/20  8:20 PM   Specimen: BLOOD RIGHT FOREARM  Result Value Ref Range Status   Specimen Description   Final    BLOOD RIGHT FOREARM Performed at [facility], [place] [address], [place], [place] [address]    Special Requests   Final    BOTTLES DRAWN AEROBIC AND ANAEROBIC Blood Culture adequate volume Performed at [facility] [address], [place], [place] [address]    Culture PENDING  Incomplete   Report Status PENDING  Incomplete  MRSA PCR Screening     Status: Abnormal   Collection Time: 07/08/20 12:52 AM   Specimen: Nasal Mucosa; Nasopharyngeal  Result Value Ref Range Status   MRSA by PCR POSITIVE (A) NEGATIVE Final    Comment:        The GeneXpert MRSA Assay (FDA approved for NASAL specimens only), is one component of  a comprehensive MRSA colonization surveillance program. It is not intended to diagnose MRSA infection nor to guide or monitor treatment for MRSA infections. RESULT CALLED TO, READ BACK BY AND VERIFIED WITH: RN PENG 07/08/20 @0301  BY P.HENDERSON Performed at [facility], [place] [address] [place], [place] [address]          Radiology Studies: CT ANGIO CHEST PE W OR WO CONTRAST  Result Date: 07/08/2020 CLINICAL DATA:  Positive D-dimer.  Shortness of breath. EXAM: CT ANGIOGRAPHY CHEST WITH CONTRAST TECHNIQUE: Multidetector CT imaging of the chest was performed using the standard protocol during bolus administration of intravenous contrast. Multiplanar CT image reconstructions and MIPs were obtained to evaluate the vascular anatomy. CONTRAST:  1102mL OMNIPAQUE IOHEXOL 350 MG/ML SOLN COMPARISON:  Chest radiography yesterday. Previous CT angiography 11/05/2019 FINDINGS: Cardiovascular: Heart size is normal. Coronary artery calcification is present. Aortic atherosclerotic calcification is present. Pulmonary arterial opacification is good. There are no pulmonary emboli. Mediastinum/Nodes: The esophagus is dilated and full of fluid, air and bowel contents. No mediastinal mass or lymphadenopathy. Lungs/Pleura: Mild emphysema at the lung apices. On the left, there is no pleural fluid. There is minimal atelectasis or scar at the left base. On the right, there is a very tiny amount of pleural fluid layering dependently. 5 mm nodule at the apex is unchanged. Chronic elevation of the right hemidiaphragm with volume loss and or pneumonia in the right lower lobe. This is more pronounced than was seen in 11/17/2019. Upper Abdomen: Small hiatal hernia. No acute upper abdominal finding. Musculoskeletal: Ordinary chronic thoracic degenerative changes. Review of the MIP images confirms the above findings. IMPRESSION: 1. No pulmonary emboli. 2. Chronic elevation of the right hemidiaphragm with volume  loss and/or pneumonia in the right lower lobe. This is more pronounced than was seen in 17-Nov-2019. 3. Dilated esophagus with fluid, air and bowel contents. 4. Aortic atherosclerosis. Coronary artery calcification. Aortic Atherosclerosis (ICD10-I70.0) and Emphysema (ICD10-J43.9). Electronically Signed   By: Nelson Chimes M.D.   On: 07/08/2020 00:51  DG Chest Port 1 View  Result Date: 07/07/2020 CLINICAL DATA:  Shortness of breath. EXAM: PORTABLE CHEST 1 VIEW COMPARISON:  February 15, 2020 FINDINGS: Mild new opacity in the right base. The lungs are otherwise clear. The cardiomediastinal silhouette is stable and unremarkable. No pneumothorax. IMPRESSION: Mild right basilar opacity could represent atelectasis or developing infection. Recommend short-term follow-up imaging to ensure resolution. Electronically Signed   By: Dorise Bullion III M.D   On: 07/07/2020 19:09        Scheduled Meds: . Chlorhexidine Gluconate Cloth  6 each Topical Q0600  . cholecalciferol  1,000 Units Oral Daily  . enoxaparin (LOVENOX) injection  40 mg Subcutaneous Q24H  . folic acid  1 mg Intravenous Daily  . furosemide  40 mg Intravenous Daily  . insulin aspart  0-9 Units Subcutaneous Q4H  . mouth rinse  15 mL Mouth Rinse BID  . mouth rinse  15 mL Mouth Rinse BID  . metoprolol tartrate  5 mg Intravenous Q8H  . mupirocin ointment  1 application Nasal BID  . pantoprazole (PROTONIX) IV  40 mg Intravenous Q12H   Continuous Infusions: . piperacillin-tazobactam (ZOSYN)  IV       LOS: 0 days    Time spent: 50 minutes    Irine Seal, MD [facility]   To contact the attending provider between 7A-7P or the covering provider during after hours 7P-7A, please log into the web site [email or web address] and access using universal [place] password for that web site. If you do not have the password, please call the hospital operator.  07/08/2020, 10:49 AM

## 2020-07-08 NOTE — Progress Notes (Signed)
Pt transported on bipap from ED to CT, then to ICU 1225.  Pt tolerated transport well without incident.

## 2020-07-08 NOTE — Progress Notes (Signed)
  Echocardiogram 2D Echocardiogram has been performed.  Jennette Dubin 07/08/2020, 1:59 PM

## 2020-07-08 NOTE — Consult Note (Addendum)
Cardiology Consultation:   Patient ID: Chloe Harding MRN: [id number]; DOB: 1946/03/27  Admit date: 07/07/2020 Date of Consult: 07/08/2020  PCP:  [name], NP   [place]  Cardiologist:  [name], MD  Advanced Practice Provider:  No care team member to display Electrophysiologist:  None   Patient Profile:   Chloe Harding is a 75 y.o. female with a hx of chronic combined CHF, coronary artery calcifications, HTN, DM, morbid obesity, urinary retention with indwelling foley catheter, hiatal hernia, thyroid nodule, aortic atherosclerosis, subdural hematoma who is being seen today for the evaluation of HTN and CHF at the request of Dr. Grandville Silos.  History of Present Illness:   Chloe Harding has had several prior admissions for various medical issues including several bouts of pneumonia, symptomatic anemia with GI bleeding 2019 (esophageal ulcer and colon polyps), metabolic encephalopathy, dysphagia 2/2 esophageal stricture s/p prior dilation, and subdural hematoma 01/2020. She was previously evaluated by our team in 10/2019 for elevated troponin in the context of a complex medical admission with acute respiratory failure with PNA, iron deficiency anemia, AKI, metabolic acidosis, and sinus tachycardia. Her elevated troponin of 1787 was felt due to demand ischemia. Echo showed EF 45-50%, grade 1 DD, mild MR, mild LAE. Given lack of ischemic symptoms and anemia, she did not undergo cardiac cath. She was readmitted 01/2020 with AMS, AKI, fall with Klebsiella UTI and a subdural hematoma. Palliative medicine saw her during that admission and full scope of care was pursued. When seen by Dr. Oval Linsey 02/2020 she was not having any concerning cardiac symptoms so given her frailty and lack of ischemic symptoms otherwise, she did not recommend any ischemic workup. Review of Vs shows she is frequently tachycardic at baseline in the 90-1teens. She reports she used to be morbidly  obese up to 270lb but worked hard over the years to achieve a weight between 140-160lb.  She presented back to the hospital with increasing SOB/DOE, orthopnea and nonproductive cough for 2 weeks. She had been seen at urgent care and tested negative for flu/Covid per her report and was started on an antibiotic a few days ago. However, symptoms worsened despite minimal exertion so she called EMS. She was found to be hypoxic with O2 sats in the 80s and placed on BiPAP. She was markedly hypertensive on arrival. She was treated with inhalers, IV steroids, IV NTG, antibiotics and Lasix. CXR showed right basilar opacity. CTA showed no PE but did show chronic elevation of the right hemidiaphragm with volume loss +/- PNA in the RLL, dilated esophagus with fluid, air and bowel contents, aortic + coronary atherosclerosis. Labs otherwise notable for mild anemia Hgb mid 10's, BNP 381, negative troponin x 2, Mg 1.6, normal lactic acid, and negative procalcitonin. She is feeling much better this morning and has been weaned back to room air. Her major complaint at this point is restless legs since her oral meds are on hold due to the esophageal issue for which GI has been consulted. She endorses some chest discomfort but this occurs exclusively when she coughs.  Past Medical History:  Diagnosis Date  . Aortic atherosclerosis ([place])   . Arthritis   . Back pain 02/23/2020  . Chronic combined systolic and diastolic CHF (congestive heart failure) ([place])   . Coronary atherosclerosis   . Depression   . Diabetes mellitus   . Esophageal stricture   . Fall   . Hiatal hernia   . Hypercholesteremia   . Hypertension   .  Obesity   . Pneumonia   . Reflux   . Renal disorder    shutting down 4 years ago  . Subdural hematoma ([place])   . Thyroid nodule   . Urinary retention   . UTI due to Klebsiella species     Past Surgical History:  Procedure Laterality Date  . BALLOON DILATION N/A 01/25/2020   Procedure: BALLOON DILATION;   Surgeon: Carol Ada, MD;  Location: [facility];  Service: Endoscopy;  Laterality: N/A;  . CHOLECYSTECTOMY    . COLONOSCOPY WITH PROPOFOL N/A 05/20/2017   Procedure: COLONOSCOPY WITH PROPOFOL;  Surgeon: Carol Ada, MD;  Location: [facility];  Service: Endoscopy;  Laterality: N/A;  . ESOPHAGOGASTRODUODENOSCOPY N/A 05/19/2017   Procedure: ESOPHAGOGASTRODUODENOSCOPY (EGD);  Surgeon: Juanita Craver, MD;  Location: [facility]COPY;  Service: Endoscopy;  Laterality: N/A;  . ESOPHAGOGASTRODUODENOSCOPY (EGD) WITH PROPOFOL N/A 01/17/2019   Procedure: ESOPHAGOGASTRODUODENOSCOPY (EGD) WITH PROPOFOL;  Surgeon: Carol Ada, MD;  Location: [facility];  Service: Endoscopy;  Laterality: N/A;  . ESOPHAGOGASTRODUODENOSCOPY (EGD) WITH PROPOFOL N/A 01/25/2020   Procedure: ESOPHAGOGASTRODUODENOSCOPY (EGD) WITH PROPOFOL;  Surgeon: Carol Ada, MD;  Location: [facility] END[facility];  Service: Endoscopy;  Laterality: N/A;  . SAVORY DILATION N/A 01/17/2019   Procedure: SAVORY DILATION;  Surgeon: Carol Ada, MD;  Location: [facility] ENDOS[facility];  Service: Endoscopy;  Laterality: N/A;     Home Medications:  Prior to Admission medications   Medication Sig Start Date End Date Taking? Authorizing Provider  acetaminophen (TYLENOL) 325 MG tablet Take 650 mg by mouth every 6 (six) hours as needed for moderate pain.     [provider]  albuterol (VENTOLIN HFA) 108 (90 Base) MCG/ACT inhaler INHALE 1-2 PUFFS BY MOUTH EVERY 4 TO 6 HOURS AS NEEDED FOR COUGH,SHORTNESS OF BREATH WHEEZING 06/28/20   [provider]  atorvastatin (LIPITOR) 20 MG tablet Take 1 tablet (20 mg total) by mouth daily. 02/18/20   Mercy Riding, MD  azithromycin (ZITHROMAX) 250 MG tablet Take 250 mg by mouth as directed. 07/07/20   [provider]  escitalopram (LEXAPRO) 10 MG tablet Take 1 tablet (10 mg total) by mouth daily. 02/18/20   Mercy Riding, MD  escitalopram (LEXAPRO) 20 MG tablet Take 20 mg by mouth daily. 06/21/20   [provider]   folic acid (FOLVITE) 1 MG tablet Take 1 tablet (1 mg total) by mouth daily. 02/14/20   Medina-Vargas, Monina C, NP  furosemide (LASIX) 20 MG tablet Take 20 mg by mouth daily as needed. 05/18/20   [provider]  furosemide (LASIX) 40 MG tablet Take 1 tablet (40 mg total) by mouth daily as needed for fluid or edema (or shortness of breath). 02/23/20   Mercy Riding, MD  gabapentin (NEURONTIN) 300 MG capsule  06/17/20   [provider]  HYDROcodone-acetaminophen (NORCO/VICODIN) 5-325 MG tablet Take 1 tablet by mouth 4 (four) times daily as needed. 06/21/20   [provider]  hydroxychloroquine (PLAQUENIL) 200 MG tablet Take 1 tablet (200 mg total) by mouth daily. 02/18/20   Mercy Riding, MD  lactulose (CHRONULAC) 10 GM/15ML solution SMARTSIG:15-30 Milliliter(s) By Mouth Daily PRN 07/06/20   [provider]  metoprolol succinate (TOPROL-XL) 25 MG 24 hr tablet Take 25 mg by mouth daily. 07/04/20   [provider]  metoprolol tartrate (LOPRESSOR) 25 MG tablet Take 25 mg by mouth 2 (two) times daily. 1 Tablet Twice Daily    [provider]  midodrine (PROAMATINE) 2.5 MG tablet Take 2.5 mg by mouth 3 (  three) times daily. 03/04/20   [provider]  Multiple Vitamin (MULTIVITAMIN WITH MINERALS) TABS tablet Take 1 tablet by mouth daily. 02/19/20   Mercy Riding, MD  Nutritional Supplements (NUTRITIONAL SUPPLEMENT PO) Take 1 each by mouth daily. Magic Cup     [provider]  ondansetron (ZOFRAN) 4 MG tablet Take 4 mg by mouth every 8 (eight) hours as needed for nausea or vomiting.     [provider]  ondansetron (ZOFRAN-ODT) 4 MG disintegrating tablet Take by mouth. 02/21/20   [provider]  pantoprazole (PROTONIX) 40 MG tablet Take 1 tablet (40 mg total) by mouth 2 (two) times daily. 02/18/20   Mercy Riding, MD  polyethylene glycol (MIRALAX / GLYCOLAX) 17 g packet Take 17 g by mouth daily as needed for moderate constipation  or severe constipation. 11/09/19   Amin, Jeanella Flattery, MD  potassium chloride (KLOR-CON) 10 MEQ tablet Take 1 tablet (10 mEq total) by mouth daily. 02/18/20   Mercy Riding, MD  potassium chloride (KLOR-CON) 10 MEQ tablet Take 10 mEq by mouth daily. 06/21/20   [provider]  promethazine (PHENERGAN) 50 MG suppository Place 50 mg rectally every 6 (six) hours as needed for nausea or vomiting. 1 Suppository 3 Times Daily    [provider]  rOPINIRole (REQUIP) 0.5 MG tablet SMARTSIG:1 Tablet(s) By Mouth Every Evening 06/21/20   [provider]  ropinirole (REQUIP) 2 MG tablet Take 2.5 tablets (5 mg total) by mouth at bedtime. 02/18/20   Mercy Riding, MD  triamcinolone (KENALOG) 0.1 % SMARTSIG:1 Application Topical 2-3 Times Daily 04/22/20   [provider]  Vitamin D3 (VITAMIN D) 25 MCG tablet Take 1 tablet (1,000 Units total) by mouth daily. 02/14/20   Medina-Vargas, Monina C, NP    Inpatient Medications: Scheduled Meds: . Chlorhexidine Gluconate Cloth  6 each Topical Q0600  . cholecalciferol  1,000 Units Oral Daily  . enoxaparin (LOVENOX) injection  40 mg Subcutaneous Q24H  . folic acid  1 mg Intravenous Daily  . furosemide  40 mg Intravenous Daily  . insulin aspart  0-9 Units Subcutaneous Q4H  . mouth rinse  15 mL Mouth Rinse BID  . mouth rinse  15 mL Mouth Rinse BID  . metoprolol tartrate  5 mg Intravenous Q8H  . mupirocin ointment  1 application Nasal BID  . pantoprazole (PROTONIX) IV  40 mg Intravenous Q12H   Continuous Infusions: . piperacillin-tazobactam (ZOSYN)  IV     PRN Meds: acetaminophen **OR** acetaminophen, ketorolac, labetalol, Muscle Rub, ondansetron **OR** ondansetron (ZOFRAN) IV, polyethylene glycol  Allergies:    Allergies  Allergen Reactions  . Amlodipine Besylate Swelling    swelling    Social History:   Social History   Socioeconomic History  . Marital status: Married    Spouse name: Not on file  . Number of children: Not  on file  . Years of education: Not on file  . Highest education level: Not on file  Occupational History  . Not on file  Tobacco Use  . Smoking status: Never Smoker  . Smokeless tobacco: Never Used  Vaping Use  . Vaping Use: Never used  Substance and Sexual Activity  . Alcohol use: No  . Drug use: No  . Sexual activity: Not Currently  Other Topics Concern  . Not on file  Social History Narrative  . Not on file   Social Determinants of Health   Financial Resource Strain: Not on file  Food Insecurity:  Not on file  Transportation Needs: Not on file  Physical Activity: Not on file  Stress: Not on file  Social Connections: Not on file  Intimate Partner Violence: Not on file    Family History:    Family History  Problem Relation Age of Onset  . Heart attack Sister   . Heart failure Sister   . Heart attack Brother   . Heart failure Brother      ROS:  Please see the history of present illness.   All other ROS reviewed and negative.     Physical Exam/Data:   Vitals:   07/08/20 0400 07/08/20 0401 07/08/20 0642 07/08/20 0800  BP:  (!) 167/96    Pulse: (!) 101 98 (!) 104   Resp: 14 14 16    Temp: 97.6 F (36.4 C)   (!) 97.5 F (36.4 C)  TempSrc: Axillary   Oral  SpO2: 100% 100% 99%   Weight:      Height:        Intake/Output Summary (Last 24 hours) at 07/08/2020 1042 Last data filed at 07/08/2020 0400 Gross per 24 hour  Intake 249.78 ml  Output 1300 ml  Net -1050.22 ml   Last 3 Weights 07/08/2020 02/23/2020 02/15/2020  Weight (lbs) 158 lb 15.2 oz 148 lb 147 lb 11.3 oz  Weight (kg) 72.1 kg 67.132 kg 67 kg     Body mass index is 31.04 kg/m.  General: Frail appearing pale elderly WF in no acute distress. Head: Normocephalic, atraumatic, sclera non-icteric, no xanthomas, nares are without discharge. Neck: Negative for carotid bruits. JVP not elevated. Lungs: Decreased BS and crackles at RLL base, no wheezing. Breathing is unlabored but deep breathing triggers  coughing. Heart: RRR S1 S2 without murmurs, rubs, or gallops.  Abdomen: Soft, non-tender, non-distended with normoactive bowel sounds. No rebound/guarding. Extremities: No clubbing or cyanosis. No edema. Distal pedal pulses are 2+ and equal bilaterally. Neuro: Alert and oriented X 3. Moves all extremities spontaneously. Psych:  Responds to questions appropriately with a normal affect.  EKG:  The EKG was personally reviewed and demonstrates:  sinus tach 110bpm, baseline wander, nonspecific STT changes  Telemetry:  Telemetry was personally reviewed and demonstrates: NSR/ST  Relevant CV Studies: 2D echo 11/06/2019   1. Mild global reduction in LV systolic function; grade 1 diastolic  dysfunction; mild MR; mild LAE.  2. Left ventricular ejection fraction, by estimation, is 45 to 50%. The  left ventricle has mildly decreased function. The left ventricle  demonstrates global hypokinesis. Left ventricular diastolic parameters are  consistent with Grade I diastolic  dysfunction (impaired relaxation). Elevated left atrial pressure.  3. Right ventricular systolic function is normal. The right ventricular  size is normal. There is mildly elevated pulmonary artery systolic  pressure.  4. Left atrial size was mildly dilated.  5. The mitral valve is normal in structure. Mild mitral valve  regurgitation. No evidence of mitral stenosis.  6. The aortic valve is tricuspid. Aortic valve regurgitation is not  visualized. Mild to moderate aortic valve sclerosis/calcification is  present, without any evidence of aortic stenosis.  7. The inferior vena cava is normal in size with greater than 50%  respiratory variability, suggesting right atrial pressure of 3 mmHg.   Laboratory Data:  High Sensitivity Troponin:   Recent Labs  Lab 07/07/20 1930 07/07/20 2037  TROPONINIHS 12 17     Chemistry Recent Labs  Lab 07/07/20 1930 07/07/20 1946 07/08/20 0215  NA 137 138 136  K 4.1 4.1 4.1  CL  102 102 102  CO2 24  --  23  GLUCOSE 180* 181* 209*  BUN 10 8 11   CREATININE 0.64 0.60 0.71  CALCIUM 9.0  --  9.3  GFRNONAA >60  --  >60  ANIONGAP 11  --  11    Recent Labs  Lab 07/07/20 1930  PROT 7.4  ALBUMIN 3.8  AST 17  ALT 8  ALKPHOS 92  BILITOT 0.7   Hematology Recent Labs  Lab 07/07/20 1930 07/07/20 1946 07/08/20 0215  WBC 7.2  --  3.7*  RBC 3.91  --  3.80*  HGB 10.8* 11.6* 10.6*  HCT 34.3* 34.0* 32.9*  MCV 87.7  --  86.6  MCH 27.6  --  27.9  MCHC 31.5  --  32.2  RDW 14.1  --  14.1  PLT 247  --  230   BNP Recent Labs  Lab 07/07/20 1930  BNP 381.5*    DDimer  Recent Labs  Lab 07/07/20 2037  DDIMER 1.11*     Radiology/Studies:  CT ANGIO CHEST PE W OR WO CONTRAST  Result Date: 07/08/2020 CLINICAL DATA:  Positive D-dimer.  Shortness of breath. EXAM: CT ANGIOGRAPHY CHEST WITH CONTRAST TECHNIQUE: Multidetector CT imaging of the chest was performed using the standard protocol during bolus administration of intravenous contrast. Multiplanar CT image reconstructions and MIPs were obtained to evaluate the vascular anatomy. CONTRAST:  148mL OMNIPAQUE IOHEXOL 350 MG/ML SOLN COMPARISON:  Chest radiography yesterday. Previous CT angiography 11/05/2019 FINDINGS: Cardiovascular: Heart size is normal. Coronary artery calcification is present. Aortic atherosclerotic calcification is present. Pulmonary arterial opacification is good. There are no pulmonary emboli. Mediastinum/Nodes: The esophagus is dilated and full of fluid, air and bowel contents. No mediastinal mass or lymphadenopathy. Lungs/Pleura: Mild emphysema at the lung apices. On the left, there is no pleural fluid. There is minimal atelectasis or scar at the left base. On the right, there is a very tiny amount of pleural fluid layering dependently. 5 mm nodule at the apex is unchanged. Chronic elevation of the right hemidiaphragm with volume loss and or pneumonia in the right lower lobe. This is more pronounced than  was seen in 11/20/19. Upper Abdomen: Small hiatal hernia. No acute upper abdominal finding. Musculoskeletal: Ordinary chronic thoracic degenerative changes. Review of the MIP images confirms the above findings. IMPRESSION: 1. No pulmonary emboli. 2. Chronic elevation of the right hemidiaphragm with volume loss and/or pneumonia in the right lower lobe. This is more pronounced than was seen in Nov 20, 2019. 3. Dilated esophagus with fluid, air and bowel contents. 4. Aortic atherosclerosis. Coronary artery calcification. Aortic Atherosclerosis (ICD10-I70.0) and Emphysema (ICD10-J43.9). Electronically Signed   By: Nelson Chimes M.D.   On: 07/08/2020 00:51   DG Chest Port 1 View  Result Date: 07/07/2020 CLINICAL DATA:  Shortness of breath. EXAM: PORTABLE CHEST 1 VIEW COMPARISON:  February 15, 2020 FINDINGS: Mild new opacity in the right base. The lungs are otherwise clear. The cardiomediastinal silhouette is stable and unremarkable. No pneumothorax. IMPRESSION: Mild right basilar opacity could represent atelectasis or developing infection. Recommend short-term follow-up imaging to ensure resolution. Electronically Signed   By: Dorise Bullion III M.D   On: 07/07/2020 19:09     Assessment and Plan:   1. Acute respiratory failure with hypoxia, likely multifactorial in the context of possible pneumonia and acute on chronic combined CHF with accelerated HTN - BP appears labile in EMR from nadir 108/66 to 197/84, last assessment 167/96 - rapidly improved with above treatments -  agree with IV metoprolol and IV Lasix as ordered for now - will discuss parenteral agents with MD - ?given wide swing in BP and question of acute pulmonary edema on arrival, question whether renal artery duplex would be of use. Her home MAR has Lasix, metoprolol and midodrine listed. Hypotension is reported in prior notes. Patient is not really clear on what she is taking - thinks her midodrine was changed to once a day and that Lasix has  only been as-needed recently - continue to hold midodrine  2. Acute on chronic combined CHF - managed in context of the above - with bowel contents getting stuck in esophagus, question the adequacy of absorption of her home regimen recently - repeat echo pending  3. H/o esophageal stricture - CT with dilated esophagus with fluid, air and bowel contents - per d/w IM, they plan to consult GI for further evaluation  4. Coronary artery calcification seen on CT previously - not on antiplatelet agents as outpatient, also has h/o fall with subdural hematoma so is poor candidate for any invasive ischemic evaluation - hsTroponins negative and reassuring  - has chest discomfort only when she coughs but not more typical angina - on BB as OP, currently on IV form here given potential stricture issues  - statin can be considered once swallowing issues resolved  5. Thyroid nodule - [place] 12/2018 with mildly suspicious nodule meeting criteria for biopsy, also a second nodule meeting criteria for follow-up 1 year - recommend further eval per IM if not already completed  - check thyroid function tests with next AM labs  Risk Assessment/Risk Scores:       New York Heart Association (NYHA) Functional Class NYHA Class IV on arrival - typically II-III     For questions or updates, please contact [place] Please consult [email or web address] for contact info under    Signed, Charlie Pitter, PA-C  07/08/2020 10:42 AM   Patient seen and examined.  Agree with above documentation.  Chloe Harding is a 75 year old female with a history of chronic systolic and diastolic heart failure, M0NU, subdural hematoma who we are consulted to see at the request of Dr. Grandville Silos for evaluation of hypertension and heart failure.  She was seen by cardiology in June 2021 for troponin elevation up to 1787, which was suspected to be demand ischemia in setting of acute respiratory failure with pneumonia.  Echocardiogram showed EF 45 to  50%.  No cardiac catheterization was done given no ischemic symptoms she was also noted to be anemic.  She had another admission 01/2020 with altered mental status and fall causing subdural hematoma.  Palliative medicine was consulted in patient remain full code.  She was seen by Dr. Oval Linsey in clinic on 02/2020, did not recommend ischemic work-up as no symptoms.  She was admitted to [facility] yesterday after presenting with shortness of breath and cough.  On presentation, initial vital signs notable for BP 197/84, pulse 110, SPO2 down to 80s requiring BiPAP.  Labs notable for creatinine 0.6, BNP 381, troponin 12 >17, lactate 1.3, hemoglobin 10.8, WBC 7.2, platelets 247, procalcitonin less than 0.1.  She was treated with IV steroids, IV nitroglycerin, antibiotics, inhalers, and Lasix.  Chest x-ray showed mild right basilar opacity.  CTPA showed no PE but did show chronic elevation of right hemidiaphragm with volume loss versus pneumonia in right lower lobe.  Also noted to have dilated esophagus with fluid, air, and bowel contents.  She has a history of esophageal strictures status  post dilatation 01/25/2020 but she missed her appointment for repeat dilatation in December 2021.  GI planning EGD tomorrow.  On exam, patient is alert and oriented, regular rate and rhythm, no murmurs, bibasilar crackles, trace lower extremity edema.  For her acute on chronic combined systolic and diastolic heart failure, presentation suggests flash pulmonary edema.  Will recheck echocardiogram.  Will check renal artery duplex to rule out stenosis.  Agree with IV Lasix.  Once GI issues addressed and taking p.o., will start back on p.o. heart failure/hypertension meds.  In the meantime can continue IV metoprolol and as needed hydralazine.  Donato Heinz, MD

## 2020-07-08 NOTE — Consult Note (Addendum)
Reason for Consult: Difficulty swallowing. Referring Physician: [facility] hospitalist-Dr. Irine Seal  Chloe Harding is an 75 y.o. female.  HPI: Chloe Harding is a 75 year old white female with multiple medical problems listed below who came to the hospital yesterday with a cough and shortness of breath seems to be worsening over the last 2 weeks.  She called EMS as she could not get into see a physician over the weekend and was treated with BiPAP by EMT's.  Patient was noted to be hypertensive in the emergency room with sats 100% on the BiPAP with respiratory 25 with heart rate of 110/min she was negative for Covid by PCR and also negative for flu by PCR.  She seems to have improved since admission and is off the BiPAP now.  A CT angiogram done on admission revealed chronic elevation of the right hemidiaphragm with volume loss with no evidence of pneumonia and large amount of debris in the dilated esophagus along with aortic atherosclerosis and coronary artery calcifications. Patient has been n.p.o. since admission is requesting to eat some soft foods.  She has a history of esophagus and esophageal stricture dilated 01/25/2020 by she was unable to go back for repeat dilatation in December of last year she had a complicated UTI.  She denies having any abdominal pain or chest pain, nausea or vomiting.  A GI consultation was requested for an endoscopy with dilatation as it was possible.  Past Medical History:  Diagnosis Date  . Aortic atherosclerosis ([place])   . Arthritis   . Back pain 02/23/2020  . Chronic combined systolic and diastolic CHF (congestive heart failure) ([place])   . Coronary atherosclerosis   . Depression   . Diabetes mellitus   . Esophageal stricture   . Fall   . Hiatal hernia   . Hypercholesteremia   . Hypertension   . Obesity   . Pneumonia   . Reflux   . Renal disorder    shutting down 4 years ago  . Subdural hematoma ([place])   . Thyroid nodule   . Urinary retention   . UTI due to  Klebsiella species    Past Surgical History:  Procedure Laterality Date  . BALLOON DILATION N/A 01/25/2020   Procedure: BALLOON DILATION;  Surgeon: Carol Ada, MD;  Location: [facility];  Service: Endoscopy;  Laterality: N/A;  . CHOLECYSTECTOMY    . COLONOSCOPY WITH PROPOFOL N/A 05/20/2017   Procedure: COLONOSCOPY WITH PROPOFOL;  Surgeon: Carol Ada, MD;  Location: [facility];  Service: Endoscopy;  Laterality: N/A;  . ESOPHAGOGASTRODUODENOSCOPY N/A 05/19/2017   Procedure: ESOPHAGOGASTRODUODENOSCOPY (EGD);  Surgeon: Juanita Craver, MD;  Location: [facility] ENDOSCOPY;  Service: Endoscopy;  Laterality: N/A;  . ESOPHAGOGASTRODUODENOSCOPY (EGD) WITH PROPOFOL N/A 01/17/2019   Procedure: ESOPHAGOGASTRODUODENOSCOPY (EGD) WITH PROPOFOL;  Surgeon: Carol Ada, MD;  Location: [facility];  Service: Endoscopy;  Laterality: N/A;  . ESOPHAGOGASTRODUODENOSCOPY (EGD) WITH PROPOFOL N/A 01/25/2020   Procedure: ESOPHAGOGASTRODUODENOSCOPY (EGD) WITH PROPOFOL;  Surgeon: Carol Ada, MD;  Location: [facility];  Service: Endoscopy;  Laterality: N/A;  . SAVORY DILATION N/A 01/17/2019   Procedure: SAVORY DILATION;  Surgeon: Carol Ada, MD;  Location: [facility];  Service: Endoscopy;  Laterality: N/A;   Family History  Problem Relation Age of Onset  . Heart attack Sister   . Heart failure Sister   . Heart attack Brother   . Heart failure Brother    Social History:  reports that she has never smoked. She has never used smokeless tobacco. She reports that she does  not drink alcohol and does not use drugs.  Allergies:  Allergies  Allergen Reactions  . Amlodipine Besylate Swelling    swelling   Medications: I have reviewed the patient's current medications.  Results for orders placed or performed during the hospital encounter of 07/07/20 (from the past 48 hour(s))  Resp Panel by RT-PCR (Flu A&B, Covid) Nasopharyngeal Swab     Status: None   Collection Time: 07/07/20  6:26 PM   Specimen: Nasopharyngeal Swab;  Nasopharyngeal(NP) swabs in vial transport medium  Result Value Ref Range   SARS Coronavirus 2 by RT PCR NEGATIVE NEGATIVE    Comment: (NOTE) SARS-CoV-2 target nucleic acids are NOT DETECTED.  The SARS-CoV-2 RNA is generally detectable in upper respiratory specimens during the acute phase of infection. The lowest concentration of SARS-CoV-2 viral copies this assay can detect is 138 copies/mL. A negative result does not preclude SARS-Cov-2 infection and should not be used as the sole basis for treatment or other patient management decisions. A negative result may occur with  improper specimen collection/handling, submission of specimen other than nasopharyngeal swab, presence of viral mutation(s) within the areas targeted by this assay, and inadequate number of viral copies(<138 copies/mL). A negative result must be combined with clinical observations, patient history, and epidemiological information. The expected result is Negative.  Fact Sheet for Patients:  EntrepreneurPulse.com.au  Fact Sheet for Healthcare Providers:  IncredibleEmployment.be  This test is no t yet approved or cleared by the [place] FDA and  has been authorized for detection and/or diagnosis of SARS-CoV-2 by FDA under an Emergency Use Authorization (EUA). This EUA will remain  in effect (meaning this test can be used) for the duration of the COVID-19 declaration under Section 564(b)(1) of the Act, 21 U.S.C.section 360bbb-3(b)(1), unless the authorization is terminated  or revoked sooner.       Influenza A by PCR NEGATIVE NEGATIVE   Influenza B by PCR NEGATIVE NEGATIVE    Comment: (NOTE) The Xpert Xpress SARS-CoV-2/FLU/RSV plus assay is intended as an aid in the diagnosis of influenza from Nasopharyngeal swab specimens and should not be used as a sole basis for treatment. Nasal washings and aspirates are unacceptable for Xpert Xpress  SARS-CoV-2/FLU/RSV testing.  Fact Sheet for Patients: EntrepreneurPulse.com.au  Fact Sheet for Healthcare Providers: IncredibleEmployment.be  This test is not yet approved or cleared by the [place] FDA and has been authorized for detection and/or diagnosis of SARS-CoV-2 by FDA under an Emergency Use Authorization (EUA). This EUA will remain in effect (meaning this test can be used) for the duration of the COVID-19 declaration under Section 564(b)(1) of the Act, 21 U.S.C. section 360bbb-3(b)(1), unless the authorization is terminated or revoked.  Performed at [facility], [place] [address] [place], [place] [address]   Comprehensive metabolic panel     Status: Abnormal   Collection Time: 07/07/20  7:30 PM  Result Value Ref Range   Sodium 137 135 - 145 mmol/L   Potassium 4.1 3.5 - 5.1 mmol/L   Chloride 102 98 - 111 mmol/L   CO2 24 22 - 32 mmol/L   Glucose, Bld 180 (H) 70 - 99 mg/dL    Comment: Glucose reference range applies only to samples taken after fasting for at least 8 hours.   BUN 10 8 - 23 mg/dL   Creatinine, Ser 0.64 0.44 - 1.00 mg/dL   Calcium 9.0 8.9 - 10.3 mg/dL   Total Protein 7.4 6.5 - 8.1 g/dL   Albumin 3.8 3.5 - 5.0  g/dL   AST 17 15 - 41 U/L   ALT 8 0 - 44 U/L   Alkaline Phosphatase 92 38 - 126 U/L   Total Bilirubin 0.7 0.3 - 1.2 mg/dL   GFR, Estimated >60 >60 mL/min    Comment: (NOTE) Calculated using the CKD-EPI Creatinine Equation (2021)    Anion gap 11 5 - 15    Comment: Performed at [facility], [place] [address] [place], [place] [address]  CBC with Differential     Status: Abnormal   Collection Time: 07/07/20  7:30 PM  Result Value Ref Range   WBC 7.2 4.0 - 10.5 K/uL   RBC 3.91 3.87 - 5.11 MIL/uL   Hemoglobin 10.8 (L) 12.0 - 15.0 g/dL   HCT 34.3 (L) 36.0 - 46.0 %   MCV 87.7 80.0 - 100.0 fL   MCH 27.6 26.0 - 34.0 pg   MCHC 31.5 30.0 - 36.0 g/dL   RDW 14.1 11.5 - 15.5 %    Platelets 247 150 - 400 K/uL   nRBC 0.0 0.0 - 0.2 %   Neutrophils Relative % 57 %   Neutro Abs 4.1 1.7 - 7.7 K/uL   Lymphocytes Relative 21 %   Lymphs Abs 1.5 0.7 - 4.0 K/uL   Monocytes Relative 5 %   Monocytes Absolute 0.4 0.1 - 1.0 K/uL   Eosinophils Relative 16 %   Eosinophils Absolute 1.2 (H) 0.0 - 0.5 K/uL   Basophils Relative 1 %   Basophils Absolute 0.1 0.0 - 0.1 K/uL   Immature Granulocytes 0 %   Abs Immature Granulocytes 0.02 0.00 - 0.07 K/uL    Comment: Performed at [facility], [place] [address], [place], [place] [address]  Troponin I (High Sensitivity)     Status: None   Collection Time: 07/07/20  7:30 PM  Result Value Ref Range   Troponin I (High Sensitivity) 12 <18 ng/L    Comment: (NOTE) Elevated high sensitivity troponin I (hsTnI) values and significant  changes across serial measurements may suggest ACS but many other  chronic and acute conditions are known to elevate hsTnI results.  Refer to the "Links" section for chest pain algorithms and additional  guidance. Performed at [facility], [place] [address], [place], [place] [address]   Brain natriuretic peptide     Status: Abnormal   Collection Time: 07/07/20  7:30 PM  Result Value Ref Range   B Natriuretic Peptide 381.5 (H) 0.0 - 100.0 pg/mL    Comment: Performed at [facility], [place] [address] [place], [place] [address]  Type and screen [place]     Status: None   Collection Time: 07/07/20  7:30 PM  Result Value Ref Range   ABO/RH(D) O POS    Antibody Screen NEG    Sample Expiration      07/10/2020,2359 Performed at [place] [address], [place], [place] [address]   I-stat chem 8, ED (not at [facility] or [facility])     Status: Abnormal   Collection Time: 07/07/20  7:46 PM  Result Value Ref Range   Sodium 138 135 - 145 mmol/L   Potassium 4.1 3.5 - 5.1 mmol/L   Chloride 102 98 - 111 mmol/L   BUN 8 8 - 23  mg/dL   Creatinine, Ser 0.60 0.44 - 1.00 mg/dL   Glucose, Bld 181 (H) 70 - 99 mg/dL    Comment: Glucose reference range applies only to samples taken  after fasting for at least 8 hours.   Calcium, Ion 1.20 1.15 - 1.40 mmol/L   TCO2 26 22 - 32 mmol/L   Hemoglobin 11.6 (L) 12.0 - 15.0 g/dL   HCT 34.0 (L) 36.0 - 46.0 %  Lactic acid, plasma     Status: None   Collection Time: 07/07/20  8:09 PM  Result Value Ref Range   Lactic Acid, Venous 1.3 0.5 - 1.9 mmol/L    Comment: Performed at [facility], [place] [address] [place], [place] [address]  Culture, blood (routine x 2)     Status: None (Preliminary result)   Collection Time: 07/07/20  8:20 PM   Specimen: BLOOD RIGHT FOREARM  Result Value Ref Range   Specimen Description      BLOOD RIGHT FOREARM Performed at [facility], [place] [address], [place], [place] [address]    Special Requests      BOTTLES DRAWN AEROBIC AND ANAEROBIC Blood Culture adequate volume Performed at [place] [address] [place], [place] [address]    Culture PENDING    Report Status PENDING   Troponin I (High Sensitivity)     Status: None   Collection Time: 07/07/20  8:37 PM  Result Value Ref Range   Troponin I (High Sensitivity) 17 <18 ng/L    Comment: (NOTE) Elevated high sensitivity troponin I (hsTnI) values and significant  changes across serial measurements may suggest ACS but many other  chronic and acute conditions are known to elevate hsTnI results.  Refer to the "Links" section for chest pain algorithms and additional  guidance. Performed at [facility], [place] [address] [place], [place] [address]   Blood gas, venous     Status: Abnormal   Collection Time: 07/07/20  8:37 PM  Result Value Ref Range   FIO2 21.00    pH, Ven 7.274 7.250 - 7.430   pCO2, Ven 60.0 44.0 - 60.0 mmHg   pO2, Ven 31.2 (LL) 32.0 - 45.0 mmHg    Comment: CRITICAL RESULT CALLED TO, READ BACK BY AND VERIFIED  WITH: Marina Gravel RN 07/07/20 @ 2257 BY P.HENDERSON    Bicarbonate 26.9 20.0 - 28.0 mmol/L   Acid-base deficit 0.1 0.0 - 2.0 mmol/L   O2 Saturation 47.9 %   Patient temperature 98.6     Comment: Performed at [facility], [place] [address], [place], [place] [address]  D-dimer, quantitative     Status: Abnormal   Collection Time: 07/07/20  8:37 PM  Result Value Ref Range   D-Dimer, Quant 1.11 (H) 0.00 - 0.50 ug/mL-FEU    Comment: (NOTE) At the manufacturer cut-off value of 0.5 g/mL FEU, this assay has a negative predictive value of 95-100%.This assay is intended for use in conjunction with a clinical pretest probability (PTP) assessment model to exclude pulmonary embolism (PE) and deep venous thrombosis (DVT) in outpatients suspected of PE or DVT. Results should be correlated with clinical presentation. Performed at [facility], [place] [address] [place],  [address]   MRSA PCR Screening     Status: Abnormal   Collection Time: 07/08/20 12:52 AM   Specimen: Nasal Mucosa; Nasopharyngeal  Result Value Ref Range   MRSA by PCR POSITIVE (A) NEGATIVE    Comment:        The GeneXpert MRSA Assay (FDA approved for NASAL specimens only), is one component of a comprehensive MRSA colonization surveillance program. It is not intended to diagnose MRSA infection nor  to guide or monitor treatment for MRSA infections. RESULT CALLED TO, READ BACK BY AND VERIFIED WITH: RN PENG 07/08/20 @0301  BY P.HENDERSON Performed at [facility], [place] [address], [place], [place] [address]   Procalcitonin - Baseline     Status: None   Collection Time: 07/08/20  2:15 AM  Result Value Ref Range   Procalcitonin <0.10 ng/mL    Comment:        Interpretation: PCT (Procalcitonin) <= 0.5 ng/mL: Systemic infection (sepsis) is not likely. Local bacterial infection is possible. (NOTE)       Sepsis PCT Algorithm           Lower Respiratory Tract                                       Infection PCT Algorithm    ----------------------------     ----------------------------         PCT < 0.25 ng/mL                PCT < 0.10 ng/mL          Strongly encourage             Strongly discourage   discontinuation of antibiotics    initiation of antibiotics    ----------------------------     -----------------------------       PCT 0.25 - 0.50 ng/mL            PCT 0.10 - 0.25 ng/mL               OR       >80% decrease in PCT            Discourage initiation of                                            antibiotics      Encourage discontinuation           of antibiotics    ----------------------------     -----------------------------         PCT >= 0.50 ng/mL              PCT 0.26 - 0.50 ng/mL               AND        <80% decrease in PCT             Encourage initiation of                                             antibiotics       Encourage continuation           of antibiotics    ----------------------------     -----------------------------        PCT >= 0.50 ng/mL                  PCT > 0.50 ng/mL               AND         increase in PCT  Strongly encourage                                      initiation of antibiotics    Strongly encourage escalation           of antibiotics                                     -----------------------------                                           PCT <= 0.25 ng/mL                                                 OR                                        > 80% decrease in PCT                                      Discontinue / Do not initiate                                             antibiotics  Performed at [place] [address] [place], [place] [address]   Magnesium     Status: Abnormal   Collection Time: 07/08/20  2:15 AM  Result Value Ref Range   Magnesium 1.6 (L) 1.7 - 2.4 mg/dL    Comment: Performed at [facility], [place] [address], [place], [place] [address]  Basic metabolic panel     Status: Abnormal   Collection Time: 07/08/20  2:15 AM  Result Value Ref Range   Sodium 136 135 - 145 mmol/L   Potassium 4.1 3.5 - 5.1 mmol/L   Chloride 102 98 - 111 mmol/L   CO2 23 22 - 32 mmol/L   Glucose, Bld 209 (H) 70 - 99 mg/dL    Comment: Glucose reference range applies only to samples taken after fasting for at least 8 hours.   BUN 11 8 - 23 mg/dL   Creatinine, Ser 0.71 0.44 - 1.00 mg/dL   Calcium 9.3 8.9 - 10.3 mg/dL   GFR, Estimated >60 >60 mL/min    Comment: (NOTE) Calculated using the CKD-EPI Creatinine Equation (2021)    Anion gap 11 5 - 15    Comment: Performed at [facility], [place] [address] [place], [place] [address]  CBC     Status: Abnormal   Collection Time: 07/08/20  2:15 AM  Result Value Ref Range   WBC 3.7 (L) 4.0 - 10.5 K/uL   RBC 3.80 (L) 3.87 - 5.11 MIL/uL   Hemoglobin 10.6 (L) 12.0 - 15.0 g/dL   HCT 32.9 (L) 36.0 - 46.0 %   MCV  86.6 80.0 - 100.0 fL   MCH 27.9 26.0 - 34.0 pg   MCHC 32.2 30.0 - 36.0 g/dL   RDW 14.1 11.5 - 15.5 %   Platelets 230 150 - 400 K/uL   nRBC 0.0 0.0 - 0.2 %    Comment: Performed at [facility], [place] [address], [place], [place] [address]  Hemoglobin A1c     Status: Abnormal   Collection Time: 07/08/20  2:15 AM  Result Value Ref Range   Hgb A1c MFr Bld 5.7 (H) 4.8 - 5.6 %    Comment: (NOTE) Pre diabetes:          5.7%-6.4%  Diabetes:              >6.4%  Glycemic control for   <7.0% adults with diabetes    Mean Plasma Glucose 116.89 mg/dL    Comment: Performed at [place] [address], [place], [place] [address]  Glucose, capillary     Status: Abnormal   Collection Time: 07/08/20 10:15 AM  Result Value Ref Range   Glucose-Capillary 162 (H) 70 - 99 mg/dL    Comment: Glucose reference range applies only to samples taken after fasting for at least 8 hours.  Glucose, capillary     Status: Abnormal    Collection Time: 07/08/20 11:49 AM  Result Value Ref Range   Glucose-Capillary 157 (H) 70 - 99 mg/dL    Comment: Glucose reference range applies only to samples taken after fasting for at least 8 hours.  Glucose, capillary     Status: Abnormal   Collection Time: 07/08/20  4:19 PM  Result Value Ref Range   Glucose-Capillary 126 (H) 70 - 99 mg/dL    Comment: Glucose reference range applies only to samples taken after fasting for at least 8 hours.   CT ANGIO CHEST PE W OR WO CONTRAST  Result Date: 07/08/2020 CLINICAL DATA:  Positive D-dimer.  Shortness of breath. EXAM: CT ANGIOGRAPHY CHEST WITH CONTRAST TECHNIQUE: Multidetector CT imaging of the chest was performed using the standard protocol during bolus administration of intravenous contrast. Multiplanar CT image reconstructions and MIPs were obtained to evaluate the vascular anatomy. CONTRAST:  158mL OMNIPAQUE IOHEXOL 350 MG/ML SOLN COMPARISON:  Chest radiography yesterday. Previous CT angiography 11/05/2019 FINDINGS: Cardiovascular: Heart size is normal. Coronary artery calcification is present. Aortic atherosclerotic calcification is present. Pulmonary arterial opacification is good. There are no pulmonary emboli. Mediastinum/Nodes: The esophagus is dilated and full of fluid, air and bowel contents. No mediastinal mass or lymphadenopathy. Lungs/Pleura: Mild emphysema at the lung apices. On the left, there is no pleural fluid. There is minimal atelectasis or scar at the left base. On the right, there is a very tiny amount of pleural fluid layering dependently. 5 mm nodule at the apex is unchanged. Chronic elevation of the right hemidiaphragm with volume loss and or pneumonia in the right lower lobe. This is more pronounced than was seen in 11/14/19. Upper Abdomen: Small hiatal hernia. No acute upper abdominal finding. Musculoskeletal: Ordinary chronic thoracic degenerative changes. Review of the MIP images confirms the above findings. IMPRESSION: 1. No  pulmonary emboli. 2. Chronic elevation of the right hemidiaphragm with volume loss and/or pneumonia in the right lower lobe. This is more pronounced than was seen in 11/14/2019. 3. Dilated esophagus with fluid, air and bowel contents. 4. Aortic atherosclerosis. Coronary artery calcification. Aortic Atherosclerosis (ICD10-I70.0) and Emphysema (ICD10-J43.9). Electronically Signed   By: Nelson Chimes M.D.   On:  07/08/2020 00:51   DG Chest Port 1 View  Result Date: 07/07/2020 CLINICAL DATA:  Shortness of breath. EXAM: PORTABLE CHEST 1 VIEW COMPARISON:  February 15, 2020 FINDINGS: Mild new opacity in the right base. The lungs are otherwise clear. The cardiomediastinal silhouette is stable and unremarkable. No pneumothorax. IMPRESSION: Mild right basilar opacity could represent atelectasis or developing infection. Recommend short-term follow-up imaging to ensure resolution. Electronically Signed   By: Dorise Bullion III M.D   On: 07/07/2020 19:09   Review of Systems  Constitutional: Positive for fatigue. Negative for appetite change, chills, diaphoresis, fever and unexpected weight change.  HENT: Negative.   Eyes: Negative.   Respiratory: Positive for cough, choking and shortness of breath. Negative for apnea, chest tightness, wheezing and stridor.   Cardiovascular: Negative.   Gastrointestinal: Negative.   Endocrine: Negative.   Genitourinary: Negative.   Musculoskeletal: Positive for arthralgias and joint swelling.  Skin: Negative.   Allergic/Immunologic: Negative.   Neurological: Negative.   Hematological: Negative.   Psychiatric/Behavioral: Negative.    Blood pressure (!) 172/79, pulse (!) 104, temperature 97.9 F (36.6 C), temperature source Oral, resp. rate 16, height 5' (1.524 m), weight 72.1 kg, SpO2 99 %. Physical Exam Constitutional:      General: She is not in acute distress.    Appearance: She is well-developed.  HENT:     Head: Normocephalic and atraumatic.  Eyes:      Extraocular Movements: Extraocular movements intact.  Cardiovascular:     Rate and Rhythm: Normal rate.  Pulmonary:     Effort: Pulmonary effort is normal.     Breath sounds: Normal breath sounds. No wheezing or rales.  Abdominal:     General: Bowel sounds are normal.     Palpations: Abdomen is soft.  Musculoskeletal:     Cervical back: Normal range of motion.  Skin:    General: Skin is warm and dry.  Neurological:     General: No focal deficit present.     Mental Status: She is alert and oriented to person, place, and time.  Psychiatric:        Mood and Affect: Mood normal.        Behavior: Behavior normal.   Assessment/Plan: 1) Acute respiratory compromise secondary to flash pulmonary edema/acute on chronic combined systolic and diastolic heart failure. Patient is stable off the BiPAP and on 2 L of oxygen by nasal cannula. There is a questionable basilar atelectasis versus pneumonia but patient does not have any fever chills or leukocytosis I think she might be stable to proceed at this time.  She is on Zosyn for possible aspiration pneumonia. 2) Abnormal abdominal CT angiogram with dilated esophagus filled with fluid and bowel contents-an EGD with possible dilation patient is scheduled for tomorrow. Patient with history of GERD and a hiatal hernia. 3) Hypertensive emergency-elevated bloodpressures in the ER. Now on Lasix and Lopressor. 4) Hyperlipidemia.  5)Rheumatoidrthritis. 6) Dression/anxiety. 7) Hyperglycemia/hypomagnesemia.  Juanita Craver 07/08/2020, 5:04 PM

## 2020-07-08 NOTE — Progress Notes (Signed)
RN notified Dr. [place] Silos that pt is in tears with c/o burning and pain in her lower legs. RN administered Toradol at 1102 with no relief. MD aware that it did not help per the patient. Pt is moving her legs up and down in bed constantly saying they're hurting. MD to follow-up.

## 2020-07-08 NOTE — Plan of Care (Signed)
?  Problem: Elimination: ?Goal: Will not experience complications related to urinary retention ?Outcome: Progressing ?  ?

## 2020-07-08 NOTE — TOC Initial Note (Addendum)
Transition of Care [facility]) - Initial/Assessment Note    Patient Details  Name: Chloe Harding MRN: [id number] Date of Birth: [birth year]  Transition of Care [facility]) CM/SW Contact:    Leeroy Cha, RN Phone Number: 07/08/2020, 8:02 AM  Clinical Narrative:                 75 y.o. female with medical history significant for chronic combined systolic and diastolic CHF (EF 85-46%, G1 DD, LV global hypokinesis by TTE 11/06/2019), hypertension, hyperlipidemia, rheumatoid arthritis, depression, dysphagia and history of esophageal stenosis s/p dilation 01/25/2020 who presents to the ED for evaluation of shortness of breath.  Patient states she has been having 2 weeks of progressive shortness of breath worse with exertion but now occurring even at rest.  She reported experiencing orthopnea last night and had to sleep sitting up in her recliner.  She has had an associated nonproductive cough.  She had sudden severe worsening of her dyspnea earlier today and called EMS.  She was placed on CPAP on route to the ED.  Patient otherwise denies any subjective fevers, chills, diaphoresis, chest pain, palpitations, abdominal pain, nausea, vomiting, dysuria, or lower extremity swelling.  She reports her weights have been up and down. Plan is to return to home with husband for support. Iv zithroimax, iv rocephin, iv mgso4 4gms x1' WAS ON BIPAP NOW ON [place] AT 2L/MIN. Expected Discharge Plan: Home/Self Care Barriers to Discharge: Continued Medical Work up   Patient Goals and CMS Choice Patient states their goals for this hospitalization and ongoing recovery are:: to go home CMS Medicare.gov Compare Post Acute Care list provided to:: Patient    Expected Discharge Plan and Services Expected Discharge Plan: Home/Self Care   Discharge Planning Services: CM Consult   Living arrangements for the past 2 months: Single Family Home                                      Prior Living  Arrangements/Services Living arrangements for the past 2 months: Single Family Home Lives with:: Spouse Patient language and need for interpreter reviewed:: Yes Do you feel safe going back to the place where you live?: Yes      Need for Family Participation in Patient Care: Yes (Comment) Care giver support system in place?: Yes (comment)   Criminal Activity/Legal Involvement Pertinent to Current Situation/Hospitalization: No - Comment as needed  Activities of Daily Living Home Assistive Devices/Equipment: Walker (specify type),Cane (specify quad or straight),Raised toilet seat with rails ADL Screening (condition at time of admission) Is the patient deaf or have difficulty hearing?: No Does the patient have difficulty seeing, even when wearing glasses/contacts?: No Does the patient have difficulty concentrating, remembering, or making decisions?: No Patient able to express need for assistance with ADLs?: Yes Does the patient have difficulty dressing or bathing?: Yes Independently performs ADLs?: Yes (appropriate for developmental age) Does the patient have difficulty walking or climbing stairs?: Yes Weakness of Legs: Both Weakness of Arms/Hands: None  Permission Sought/Granted                  Emotional Assessment Appearance:: Appears stated age Attitude/Demeanor/Rapport: Engaged Affect (typically observed): Calm Orientation: : Oriented to Place,Oriented to Self,Oriented to  Time,Oriented to Situation Alcohol / Substance Use: Not Applicable    Admission diagnosis:  Acute respiratory failure ([place]) [J96.00] Community acquired pneumonia of right lower lobe of lung [J18.9] Patient Active  Problem List   Diagnosis Date Noted  . Acute respiratory failure ([place]) 07/07/2020  . Hypertensive emergency 07/07/2020  . Acute on chronic combined systolic and diastolic CHF (congestive heart failure) ([place]) 07/07/2020  . Back pain 02/23/2020  . Tachycardia 02/15/2020  . Fall   .  Neurocognitive deficits 01/30/2020  . AKI (acute kidney injury) ([place]) 01/19/2020  . Acute encephalopathy 01/18/2020  . Diarrhea 12/23/2019  . SOB (shortness of breath)   . Demand ischemia ([place])   . CAD in native artery   . Pneumonia 11/05/2019  . Hyponatremia 11/05/2019  . Acute CHF (congestive heart failure) ([place]) 11/05/2019  . Esophageal stricture   . Dysphagia 01/15/2019  . Symptomatic anemia 05/18/2017  . Acute-on-chronic kidney injury ([place]) 05/18/2017  . Hypertension 05/18/2017  . Hiatal hernia 05/18/2017  . History of esophageal stricture 05/18/2017  . Pure hypercholesterolemia 05/18/2017  . Diabetes mellitus 05/18/2017  . Solitary thyroid nodule 05/18/2017  . Hypotension 03/30/2015  . Sepsis ([place]) 03/30/2015  . CAP (community acquired pneumonia) 03/30/2015  . Hyperkalemia 03/30/2015  . Mild renal insufficiency 03/30/2015  . Nausea with vomiting 03/30/2015  . Diabetic neuropathy ([place]) 03/30/2015  . OBESITY 03/11/2010  . Diabetes with neurologic complications ([place]) 17/51/0258  . Dyslipidemia 12/26/2009  . ANEMIA 12/26/2009  . Major depression, recurrent ([place]) 12/26/2009  . HYPERTENSION, BENIGN ESSENTIAL 12/26/2009  . GERD 12/26/2009  . HIATAL HERNIA 12/26/2009  . CHRONIC KIDNEY DISEASE STAGE I 12/26/2009  . UTI due to Klebsiella species 12/26/2009  . Arthropathy 12/26/2009  . ARTHROSCOPY, RIGHT KNEE, HX OF 12/26/2009   PCP:  [name], NP Pharmacy:   [facility] [address] ([address]), [place] [address] [place]  [address] Phone: [phone] Fax: [phone]  [facility] [place], [place] [address] Phone: [phone] Fax: [phone]     Social Determinants of Health (SDOH) Interventions    Readmission Risk Interventions No flowsheet data found.

## 2020-07-08 NOTE — Plan of Care (Signed)
  Problem: Education: Goal: Knowledge of General Education information will improve Description: Including pain rating scale, medication(s)/side effects and non-pharmacologic comfort measures Outcome: Progressing   Problem: Health Behavior/Discharge Planning: Goal: Ability to manage health-related needs will improve Outcome: Progressing   Problem: Clinical Measurements: Goal: Ability to maintain clinical measurements within normal limits will improve Outcome: Progressing Goal: Respiratory complications will improve Outcome: Progressing   Problem: Activity: Goal: Risk for activity intolerance will decrease Outcome: Progressing   Problem: Coping: Goal: Level of anxiety will decrease Outcome: Progressing

## 2020-07-09 ENCOUNTER — Encounter (HOSPITAL_COMMUNITY): Payer: Self-pay | Admitting: Internal Medicine

## 2020-07-09 ENCOUNTER — Inpatient Hospital Stay (HOSPITAL_COMMUNITY): Payer: Medicare HMO | Admitting: Anesthesiology

## 2020-07-09 ENCOUNTER — Inpatient Hospital Stay (HOSPITAL_COMMUNITY): Payer: Medicare HMO

## 2020-07-09 ENCOUNTER — Encounter (HOSPITAL_COMMUNITY)
Admission: EM | Disposition: A | Discharge status: Home / Self Care | Payer: Self-pay | Source: Home / Self Care | Attending: Internal Medicine

## 2020-07-09 DIAGNOSIS — J690 Pneumonitis due to inhalation of food and vomit: Secondary | ICD-10-CM | POA: Diagnosis not present

## 2020-07-09 DIAGNOSIS — I379 Nonrheumatic pulmonary valve disorder, unspecified: Secondary | ICD-10-CM

## 2020-07-09 DIAGNOSIS — J9601 Acute respiratory failure with hypoxia: Secondary | ICD-10-CM | POA: Diagnosis not present

## 2020-07-09 DIAGNOSIS — E041 Nontoxic single thyroid nodule: Secondary | ICD-10-CM

## 2020-07-09 DIAGNOSIS — J810 Acute pulmonary edema: Secondary | ICD-10-CM | POA: Diagnosis not present

## 2020-07-09 DIAGNOSIS — K2289 Other specified disease of esophagus: Secondary | ICD-10-CM | POA: Diagnosis not present

## 2020-07-09 DIAGNOSIS — K219 Gastro-esophageal reflux disease without esophagitis: Secondary | ICD-10-CM

## 2020-07-09 DIAGNOSIS — I5043 Acute on chronic combined systolic (congestive) and diastolic (congestive) heart failure: Secondary | ICD-10-CM | POA: Diagnosis not present

## 2020-07-09 HISTORY — PX: ESOPHAGOGASTRODUODENOSCOPY (EGD) WITH PROPOFOL: SHX5813

## 2020-07-09 LAB — GLUCOSE, CAPILLARY
Glucose-Capillary: 102 mg/dL — ABNORMAL HIGH (ref 70–99)
Glucose-Capillary: 136 mg/dL — ABNORMAL HIGH (ref 70–99)
Glucose-Capillary: 171 mg/dL — ABNORMAL HIGH (ref 70–99)
Glucose-Capillary: 87 mg/dL (ref 70–99)
Glucose-Capillary: 92 mg/dL (ref 70–99)
Glucose-Capillary: 97 mg/dL (ref 70–99)

## 2020-07-09 LAB — CBC WITH DIFFERENTIAL/PLATELET
Abs Immature Granulocytes: 0.02 10*3/uL (ref 0.00–0.07)
Basophils Absolute: 0 10*3/uL (ref 0.0–0.1)
Basophils Relative: 1 %
Eosinophils Absolute: 0.1 10*3/uL (ref 0.0–0.5)
Eosinophils Relative: 1 %
HCT: 30.1 % — ABNORMAL LOW (ref 36.0–46.0)
Hemoglobin: 9.8 g/dL — ABNORMAL LOW (ref 12.0–15.0)
Immature Granulocytes: 0 %
Lymphocytes Relative: 31 %
Lymphs Abs: 2 10*3/uL (ref 0.7–4.0)
MCH: 28.2 pg (ref 26.0–34.0)
MCHC: 32.6 g/dL (ref 30.0–36.0)
MCV: 86.5 fL (ref 80.0–100.0)
Monocytes Absolute: 0.5 10*3/uL (ref 0.1–1.0)
Monocytes Relative: 7 %
Neutro Abs: 4 10*3/uL (ref 1.7–7.7)
Neutrophils Relative %: 60 %
Platelets: 243 10*3/uL (ref 150–400)
RBC: 3.48 MIL/uL — ABNORMAL LOW (ref 3.87–5.11)
RDW: 14.1 % (ref 11.5–15.5)
WBC: 6.7 10*3/uL (ref 4.0–10.5)
nRBC: 0 % (ref 0.0–0.2)

## 2020-07-09 LAB — ECHOCARDIOGRAM LIMITED
Height: 60 in
Weight: 2496 oz

## 2020-07-09 LAB — COMPREHENSIVE METABOLIC PANEL
ALT: 9 U/L (ref 0–44)
AST: 19 U/L (ref 15–41)
Albumin: 3.8 g/dL (ref 3.5–5.0)
Alkaline Phosphatase: 73 U/L (ref 38–126)
Anion gap: 13 (ref 5–15)
BUN: 18 mg/dL (ref 8–23)
CO2: 26 mmol/L (ref 22–32)
Calcium: 10 mg/dL (ref 8.9–10.3)
Chloride: 102 mmol/L (ref 98–111)
Creatinine, Ser: 0.96 mg/dL (ref 0.44–1.00)
GFR, Estimated: >60 mL/min (ref >60)
Glucose, Bld: 116 mg/dL — ABNORMAL HIGH (ref 70–99)
Potassium: 3.6 mmol/L (ref 3.5–5.1)
Sodium: 141 mmol/L (ref 135–145)
Total Bilirubin: 1.2 mg/dL (ref 0.3–1.2)
Total Protein: 6.9 g/dL (ref 6.5–8.1)

## 2020-07-09 LAB — MAGNESIUM: Magnesium: 2.4 mg/dL (ref 1.7–2.4)

## 2020-07-09 LAB — TSH: TSH: 1.019 u[IU]/mL (ref 0.350–4.500)

## 2020-07-09 LAB — T4, FREE: Free T4: 1.06 ng/dL (ref 0.61–1.12)

## 2020-07-09 SURGERY — ESOPHAGOGASTRODUODENOSCOPY (EGD) WITH PROPOFOL
Anesthesia: Monitor Anesthesia Care

## 2020-07-09 MED ORDER — LIDOCAINE HCL (CARDIAC) PF 100 MG/5ML IV SOSY
PREFILLED_SYRINGE | INTRAVENOUS | Status: DC | PRN
  Administered 2020-07-09: 60 mg via INTRATRACHEAL

## 2020-07-09 MED ORDER — HYDROCODONE-ACETAMINOPHEN 5-325 MG PO TABS
1.0000 | ORAL_TABLET | Freq: Once | ORAL | Status: AC
  Administered 2020-07-09: 1 via ORAL
  Filled 2020-07-09: qty 1

## 2020-07-09 MED ORDER — ONDANSETRON HCL 4 MG/2ML IJ SOLN
INTRAMUSCULAR | Status: DC | PRN
  Administered 2020-07-09: 4 mg via INTRAVENOUS

## 2020-07-09 MED ORDER — PROPOFOL 500 MG/50ML IV EMUL
INTRAVENOUS | Status: DC | PRN
  Administered 2020-07-09: 135 ug/kg/min via INTRAVENOUS

## 2020-07-09 MED ORDER — LABETALOL HCL 5 MG/ML IV SOLN
INTRAVENOUS | Status: AC
  Filled 2020-07-09: qty 4

## 2020-07-09 MED ORDER — LABETALOL HCL 5 MG/ML IV SOLN
5.0000 mg | Freq: Once | INTRAVENOUS | Status: AC
  Administered 2020-07-09: 5 mg via INTRAVENOUS

## 2020-07-09 MED ORDER — PROPOFOL 500 MG/50ML IV EMUL
INTRAVENOUS | Status: DC | PRN
  Administered 2020-07-09: 10 mg via INTRAVENOUS

## 2020-07-09 MED ORDER — LACTATED RINGERS IV SOLN: INTRAVENOUS | Status: DC | PRN

## 2020-07-09 SURGICAL SUPPLY — 15 items

## 2020-07-09 NOTE — Anesthesia Preprocedure Evaluation (Signed)
Anesthesia Evaluation  Patient identified by MRN, date of birth, ID band Patient awake    Reviewed: Allergy & Precautions, NPO status , Patient's Chart, lab work & pertinent test results, reviewed documented beta blocker date and time   Airway Mallampati: II  TM Distance: >3 FB Neck ROM: Full    Dental  (+) Dental Advisory Given, Edentulous Upper, Partial Lower   Pulmonary neg pulmonary ROS,    Pulmonary exam normal breath sounds clear to auscultation       Cardiovascular hypertension, Pt. on home beta blockers and Pt. on medications + CAD and +CHF  Normal cardiovascular exam Rhythm:Regular Rate:Normal     Neuro/Psych PSYCHIATRIC DISORDERS Depression negative neurological ROS     GI/Hepatic Neg liver ROS, hiatal hernia, GERD  Medicated,  Endo/Other  diabetes, Type 2Obesity   Renal/GU Renal InsufficiencyRenal disease     Musculoskeletal  (+) Arthritis ,   Abdominal   Peds  Hematology  (+) Blood dyscrasia, anemia ,   Anesthesia Other Findings Day of surgery medications reviewed with the patient.  Reproductive/Obstetrics                             Anesthesia Physical Anesthesia Plan  ASA: III  Anesthesia Plan: MAC   Post-op Pain Management:    Induction: Intravenous  PONV Risk Score and Plan: 2 and Propofol infusion and Treatment may vary due to age or medical condition  Airway Management Planned: Nasal Cannula  Additional Equipment:   Intra-op Plan:   Post-operative Plan:   Informed Consent: I have reviewed the patients History and Physical, chart, labs and discussed the procedure including the risks, benefits and alternatives for the proposed anesthesia with the patient or authorized representative who has indicated his/her understanding and acceptance.     Dental advisory given  Plan Discussed with: CRNA and Anesthesiologist  Anesthesia Plan Comments:          Anesthesia Quick Evaluation

## 2020-07-09 NOTE — Transfer of Care (Signed)
Immediate Anesthesia Transfer of Care Note  Patient: Chloe Harding  Procedure(s) Performed: ESOPHAGOGASTRODUODENOSCOPY (EGD) WITH PROPOFOL (N/A )  Patient Location: PACU and Endoscopy Unit  Anesthesia Type:MAC  Level of Consciousness: drowsy and patient cooperative  Airway & Oxygen Therapy: Patient Spontanous Breathing and Patient connected to face mask oxygen  Post-op Assessment: Report given to RN and Post -op Vital signs reviewed and stable  Post vital signs: Reviewed and stable  Last Vitals:  Vitals Value Taken Time  BP 128/59 07/09/20 1232  Temp    Pulse 88 07/09/20 1234  Resp 20 07/09/20 1234  SpO2 100 % 07/09/20 1234  Vitals shown include unvalidated device data.  Last Pain:  Vitals:   07/09/20 1139  TempSrc: Oral  PainSc: 0-No pain      Patients Stated Pain Goal: 2 (65/03/54 6568)  Complications: No complications documented.

## 2020-07-09 NOTE — Interval H&P Note (Signed)
History and Physical Interval Note:  07/09/2020 11:57 AM  Chloe Harding  has presented today for surgery, with the diagnosis of Dysphagia with esophageal stricture noted on a CT angiogram with a dilated esophagus, GERD/hiatal hernia.  The various methods of treatment have been discussed with the patient and family. After consideration of risks, benefits and other options for treatment, the patient has consented to  Procedure(s): ESOPHAGOGASTRODUODENOSCOPY (EGD) WITH PROPOFOL (N/A) as a surgical intervention.  The patient's history has been reviewed, patient examined, no change in status, stable for surgery.  I have reviewed the patient's chart and labs.  Questions were answered to the patient's satisfaction.     Toshi Ishii D

## 2020-07-09 NOTE — [place] Notes (Addendum)
PROGRESS NOTE    Chloe Harding  LKG:[id number] DOB: Jul 11, 1945 DOA: 07/07/2020 PCP: [name], NP   Chief Complaint  Patient presents with  . Shortness of Breath    Brief Narrative: Patient is a 75 year old female history of chronic combined systolic and diastolic CHF (EF 45 to 66%, G1 DD, LV global hypokinesis.  TTE 11-25-19), hypertension, hyperlipidemia, rheumatoid arthritis, depression, dysphagia with history of esophageal stenosis status post dilatation 01/25/2020 presented to the ED for evaluation of shortness of breath.  Patient noted to have had progressive shortness of breath x2 weeks and now occurring at rest and in the supine position.  Patient with sudden severe worsening shortness of breath called EMS placed on a BiPAP brought to the ED.  Patient seen in the ED noted to be in hypertensive emergency with blood pressure 197/84, sats of 100% on BiPAP, respiratory rate of 25, tachycardic with a pulse of 110.  SARS coronavirus 2 PCR negative.  Influenza A and B PCR negative.  Patient placed on BiPAP with some improvement in respiratory status.  BNP elevated at 381.5.  Patient given a dose of IV Lasix in the ED, IV Solu-Medrol, IV nitroglycerin drip for improvement with blood pressure. CT angiogram chest ordered which was negative for PE, chronic elevation of right hemidiaphragm with volume loss and no pneumonia in the right lower lobe more pronounced than seen in 2019/11/25, dilated esophagus with fluid, and bowel contents.  Aortic atherosclerosis.  Coronary artery calcification. Patient admitted and placed in the stepdown unit.   Assessment & Plan:   Principal Problem:   Acute respiratory failure (HCC) Active Problems:   Major depression, recurrent ([place])   GERD   Pure hypercholesterolemia   Esophageal stricture   Hypertensive emergency   Acute on chronic combined systolic and diastolic CHF (congestive heart failure) (HCC)   Aspiration pneumonia (HCC)   Esophageal  dilatation   Hypomagnesemia  1 acute respiratory failure likely multifactorial secondary to probable aspiration pneumonia and flash pulmonary edema/acute on chronic combined systolic and diastolic heart failure/??  Echo bright density on pulmonic valve Patient presenting in acute respiratory failure noted to be in hypertensive urgency and initially placed on the BiPAP.  Patient noted to have had some improvement in respiratory symptoms on BiPAP currently on 2 L nasal cannula.  Chest x-ray done on admission concern for left base atelectasis versus developing infiltrate.  CT angiogram chest done due to elevated D-dimer and tachycardia was negative for PE however concerning for dilated esophagus with fluid and bowel contents.  Patient likely with an aspiration event.  Patient with history of esophageal stricture.  Discontinued BiPAP.  EKG with normal sinus rhythm.  Cardiac enzymes negative.  2D echo with EF of 60 to 65%, no wall motion abnormalities, grade 2 diastolic dysfunction, elevated left atrial pressure, severely dilated left atrial size, moderate mitral valve regurgitation, echo bright density on ventricular side of pulmonic valve cannot exclude mass/vegetation.  Limited 2D echo ordered and pending per cardiology.  Patient with urine output of 1.6 L over the past 24 hours.  Patient is -2.6 L during this hospitalization.  Current weight of 70.8 kg from 72.1 kg on admission.  Continue Lasix 40 mg IV daily.  Strict I's and O's.  Daily weights.  Cardiology consulted and are following.  GI following.   2.  Dilated esophagus with fluid and bowel contents Noted on CT angiogram chest.  Patient with a history of chronic dysphagia and esophageal stricture status post dilatation 01/25/2020.  Patient at that time noted to have had an esophageal ulcer and placed on PPI twice daily which she states she is compliant with.  Patient currently NPO.  Patient seen in consultation by GI and patient for probable upper endoscopy  and possible dilatation today.  Continue IV PPI every 12 hours.  Per GI.  3.  Hypertensive emergency Patient noted to have hypertensive urgency with elevated blood pressure.  Patient initially placed on nitroglycerin infusion which has been discontinued.  Patient received a dose of Lasix 40 mg IV x1.  Placed on Lasix 40 mg IV daily due to problem #1.  Lopressor increased to 7.5 mg IV every 8 hours.  IV hydralazine as needed.  Cardiology consulted and are following.   4.  Hyperlipidemia Continue to hold statin as patient n.p.o with concerns for esophageal stricture.  5.  Probable aspiration pneumonia Chest x-ray done on admission concerning for right base atelectasis versus developing infection.  CT angiogram chest done with dilated esophagus with fluid and bowel contents noted, chronic elevation of right hemidiaphragm with volume loss and no pneumonia in the right lower lobe.  Patient currently n.p.o. awaiting further evaluation will per GI with upper endoscopy.  Continue IV Zosyn.  GI consulted and patient for probable upper endoscopy to evaluate for stricture.  Follow.   6.  Rheumatoid arthritis On Plaquenil as outpatient which we will hold for now.  Resume once cleared by GI for oral intake.  7.  Depression/anxiety Hold Lexapro until patient able to resume oral intake.  8.  Restless leg syndrome Resume ropinirole once patient able to take oral intake and cleared by GI.  9.  Hypomagnesemia Repleted.  Magnesium at 2.4.  Follow.  10.  Hyperglycemia ??  History of diabetes mellitus.  Hemoglobin A1c 01/29/2020 at 4.6.  Patient not on any diabetic medications on med rec.repeat hemoglobin A1c 5.7.  Discontinue sliding scale insulin.  Follow.  11.  Thyroid nodule Patient noted to have a thyroid ultrasound done 01/15/2019 with mildly suspicious nodule which meet criteria for biopsy, second nodule for follow-up in a year.  Patient states nodule was biopsied in the past and told it was benign.  TSH  within normal limits.  Repeat thyroid ultrasound.  Pending thyroid ultrasound may need FNA.  Further work-up in the outpatient setting.     DVT prophylaxis: Lovenox Code Status: Full Family Communication: Updated patient.  No family at bedside. Disposition:   Status is: Observation    Dispo: The patient is from: Home              Anticipated d/c is to: Home              Anticipated d/c date is: 3 to 4 days.              Patient currently n.p.o., concern for esophageal stricture, acute CHF exacerbation, awaiting upper endoscopy and further evaluation, not stable for discharge.   Difficult to place patient no       Consultants:   Cardiology: Gardiner Rhyme 07/08/2020  Gastroenterology: Dr. Collene Mares 07/08/2020  Procedures:   Chest x-ray 07/07/2020  CT angiogram chest 07/08/2020  Thyroid ultrasound pending 07/09/2020  Limited 2D echo pending 07/09/2020  2D echo 07/08/2020  Antimicrobials:   IV Rocephin 07/07/2020>>>> 07/08/2020  IV azithromycin 2/21/ 2022x1 dose  IV Zosyn 07/08/2020>>>>>   Subjective: Patient sleeping but arousable.  Denies chest pain.  States shortness of breath has improved significantly.  Awaiting further evaluation with upper endoscopy.  Patient does  state thyroid nodule was biopsied before in the past when she had dilatation done and was told it was benign.  Objective: Vitals:   07/08/20 2013 07/09/20 0000 07/09/20 0400 07/09/20 0800  BP: (!) 163/77 (!) 168/95 (!) 159/68   Pulse: 85 85 100   Resp: 19 20 18    Temp:  97.6 F (36.4 C) 97.7 F (36.5 C) 98.7 F (37.1 C)  TempSrc:  Oral Oral Oral  SpO2: 97% 96% 97%   Weight:      Height:        Intake/Output Summary (Last 24 hours) at 07/09/2020 1034 Last data filed at 07/08/2020 2218 Gross per 24 hour  Intake 48.15 ml  Output 800 ml  Net -751.85 ml   Filed Weights   07/08/20 0100  Weight: 72.1 kg    Examination:  General exam: Appears calm and comfortable  Respiratory system: Right basilar  crackles.  No wheezing.  Fair air movement.  Speaking in full sentences.  Cardiovascular system: Regular rate rhythm no murmurs rubs or gallops.  No JVD.  No lower extremity edema.  Gastrointestinal system: Abdomen is soft, nontender, nondistended, positive bowel sounds.  No rebound.  No guarding.  Central nervous system: Alert and oriented. No focal neurological deficits. Extremities: Symmetric 5 x 5 power. Skin: No rashes, lesions or ulcers Psychiatry: Judgement and insight appear normal. Mood & affect appropriate.     Data Reviewed: I have personally reviewed following labs and imaging studies  CBC: Recent Labs  Lab 07/07/20 1930 07/07/20 1946 07/08/20 0215 07/09/20 0248  WBC 7.2  --  3.7* 6.7  NEUTROABS 4.1  --   --  4.0  HGB 10.8* 11.6* 10.6* 9.8*  HCT 34.3* 34.0* 32.9* 30.1*  MCV 87.7  --  86.6 86.5  PLT 247  --  230 836    Basic Metabolic Panel: Recent Labs  Lab 07/07/20 1930 07/07/20 1946 07/08/20 0215 07/09/20 0248  NA 137 138 136 141  K 4.1 4.1 4.1 3.6  CL 102 102 102 102  CO2 24  --  23 26  GLUCOSE 180* 181* 209* 116*  BUN 10 8 11 18   CREATININE 0.64 0.60 0.71 0.96  CALCIUM 9.0  --  9.3 10.0  MG  --   --  1.6* 2.4    GFR: Estimated Creatinine Clearance: 46.2 mL/min (by C-G formula based on SCr of 0.96 mg/dL).  Liver Function Tests: Recent Labs  Lab 07/07/20 1930 07/09/20 0248  AST 17 19  ALT 8 9  ALKPHOS 92 73  BILITOT 0.7 1.2  PROT 7.4 6.9  ALBUMIN 3.8 3.8    CBG: Recent Labs  Lab 07/08/20 1619 07/08/20 1949 07/08/20 2357 07/09/20 0547 07/09/20 0752  GLUCAP 126* 125* 102* 97 87     Recent Results (from the past 240 hour(s))  Resp Panel by RT-PCR (Flu A&B, Covid) Nasopharyngeal Swab     Status: None   Collection Time: 07/07/20  6:26 PM   Specimen: Nasopharyngeal Swab; Nasopharyngeal(NP) swabs in vial transport medium  Result Value Ref Range Status   SARS Coronavirus 2 by RT PCR NEGATIVE NEGATIVE Final    Comment:  (NOTE) SARS-CoV-2 target nucleic acids are NOT DETECTED.  The SARS-CoV-2 RNA is generally detectable in upper respiratory specimens during the acute phase of infection. The lowest concentration of SARS-CoV-2 viral copies this assay can detect is 138 copies/mL. A negative result does not preclude SARS-Cov-2 infection and should not be used as the sole basis for treatment or other patient  management decisions. A negative result may occur with  improper specimen collection/handling, submission of specimen other than nasopharyngeal swab, presence of viral mutation(s) within the areas targeted by this assay, and inadequate number of viral copies(<138 copies/mL). A negative result must be combined with clinical observations, patient history, and epidemiological information. The expected result is Negative.  Fact Sheet for Patients:  EntrepreneurPulse.com.au  Fact Sheet for Healthcare Providers:  IncredibleEmployment.be  This test is no t yet approved or cleared by the [place] FDA and  has been authorized for detection and/or diagnosis of SARS-CoV-2 by FDA under an Emergency Use Authorization (EUA). This EUA will remain  in effect (meaning this test can be used) for the duration of the COVID-19 declaration under Section 564(b)(1) of the Act, 21 U.S.C.section 360bbb-3(b)(1), unless the authorization is terminated  or revoked sooner.       Influenza A by PCR NEGATIVE NEGATIVE Final   Influenza B by PCR NEGATIVE NEGATIVE Final    Comment: (NOTE) The Xpert Xpress SARS-CoV-2/FLU/RSV plus assay is intended as an aid in the diagnosis of influenza from Nasopharyngeal swab specimens and should not be used as a sole basis for treatment. Nasal washings and aspirates are unacceptable for Xpert Xpress SARS-CoV-2/FLU/RSV testing.  Fact Sheet for Patients: EntrepreneurPulse.com.au  Fact Sheet for Healthcare  Providers: IncredibleEmployment.be  This test is not yet approved or cleared by the [place] FDA and has been authorized for detection and/or diagnosis of SARS-CoV-2 by FDA under an Emergency Use Authorization (EUA). This EUA will remain in effect (meaning this test can be used) for the duration of the COVID-19 declaration under Section 564(b)(1) of the Act, 21 U.S.C. section 360bbb-3(b)(1), unless the authorization is terminated or revoked.  Performed at [facility], [place] [address] [place], [place] [address]   Culture, blood (routine x 2)     Status: None (Preliminary result)   Collection Time: 07/07/20  8:20 PM   Specimen: BLOOD RIGHT FOREARM  Result Value Ref Range Status   Specimen Description   Final    BLOOD RIGHT FOREARM Performed at [facility], [place] [address], [place], [place] [address]    Special Requests   Final    BOTTLES DRAWN AEROBIC AND ANAEROBIC Blood Culture adequate volume Performed at [place] [address] [place], [place] [address]    Culture   Final    NO GROWTH 1 DAY Performed at [facility], [place] [address] [place], [place] [address]    Report Status PENDING  Incomplete  Culture, blood (routine x 2)     Status: None (Preliminary result)   Collection Time: 07/07/20  8:40 PM   Specimen: BLOOD  Result Value Ref Range Status   Specimen Description   Final    BLOOD RIGHT ANTECUBITAL Performed at [place] [address], [place], [place] [address]    Special Requests   Final    BOTTLES DRAWN AEROBIC AND ANAEROBIC Blood Culture adequate volume Performed at [place] [address] [place], [place] [address]    Culture   Final    NO GROWTH 1 DAY Performed at [facility], [place] [address], [place], [place] [address]    Report Status PENDING  Incomplete  MRSA PCR Screening     Status: Abnormal   Collection Time:  07/08/20 12:52 AM   Specimen: Nasal Mucosa; Nasopharyngeal  Result Value Ref Range Status   MRSA by PCR POSITIVE (A)  NEGATIVE Final    Comment:        The GeneXpert MRSA Assay (FDA approved for NASAL specimens only), is one component of a comprehensive MRSA colonization surveillance program. It is not intended to diagnose MRSA infection nor to guide or monitor treatment for MRSA infections. RESULT CALLED TO, READ BACK BY AND VERIFIED WITH: RN PENG 07/08/20 @0301  BY P.HENDERSON Performed at [facility], [place] [address], [place], [place] [address]          Radiology Studies: CT ANGIO CHEST PE W OR WO CONTRAST  Result Date: 07/08/2020 CLINICAL DATA:  Positive D-dimer.  Shortness of breath. EXAM: CT ANGIOGRAPHY CHEST WITH CONTRAST TECHNIQUE: Multidetector CT imaging of the chest was performed using the standard protocol during bolus administration of intravenous contrast. Multiplanar CT image reconstructions and MIPs were obtained to evaluate the vascular anatomy. CONTRAST:  127mL OMNIPAQUE IOHEXOL 350 MG/ML SOLN COMPARISON:  Chest radiography yesterday. Previous CT angiography 11/05/2019 FINDINGS: Cardiovascular: Heart size is normal. Coronary artery calcification is present. Aortic atherosclerotic calcification is present. Pulmonary arterial opacification is good. There are no pulmonary emboli. Mediastinum/Nodes: The esophagus is dilated and full of fluid, air and bowel contents. No mediastinal mass or lymphadenopathy. Lungs/Pleura: Mild emphysema at the lung apices. On the left, there is no pleural fluid. There is minimal atelectasis or scar at the left base. On the right, there is a very tiny amount of pleural fluid layering dependently. 5 mm nodule at the apex is unchanged. Chronic elevation of the right hemidiaphragm with volume loss and or pneumonia in the right lower lobe. This is more pronounced than was seen in Nov 25, 2019. Upper Abdomen: Small hiatal hernia. No  acute upper abdominal finding. Musculoskeletal: Ordinary chronic thoracic degenerative changes. Review of the MIP images confirms the above findings. IMPRESSION: 1. No pulmonary emboli. 2. Chronic elevation of the right hemidiaphragm with volume loss and/or pneumonia in the right lower lobe. This is more pronounced than was seen in November 25, 2019. 3. Dilated esophagus with fluid, air and bowel contents. 4. Aortic atherosclerosis. Coronary artery calcification. Aortic Atherosclerosis (ICD10-I70.0) and Emphysema (ICD10-J43.9). Electronically Signed   By: Nelson Chimes M.D.   On: 07/08/2020 00:51   DG Chest Port 1 View  Result Date: 07/07/2020 CLINICAL DATA:  Shortness of breath. EXAM: PORTABLE CHEST 1 VIEW COMPARISON:  February 15, 2020 FINDINGS: Mild new opacity in the right base. The lungs are otherwise clear. The cardiomediastinal silhouette is stable and unremarkable. No pneumothorax. IMPRESSION: Mild right basilar opacity could represent atelectasis or developing infection. Recommend short-term follow-up imaging to ensure resolution. Electronically Signed   By: Dorise Bullion III M.D   On: 07/07/2020 19:09   ECHOCARDIOGRAM COMPLETE  Result Date: 07/08/2020    ECHOCARDIOGRAM REPORT   Patient Name:   KATRINIA STRAKER Date of Exam: 07/08/2020 Medical Rec #:  [id number]      Height:       60.0 in Accession #:    [id number]     Weight:       159.0 lb Date of Birth:  [birth year]     BSA:          1.693 m Patient Age:    25 years       BP:           172/79 mmHg Patient Gender: F              HR:           79 bpm. Exam Location:  Inpatient Procedure: 2D Echo Indications:    CHF-Acute Systolic F79.02  History:        Patient has prior history of Echocardiogram examinations, most                 recent 11/06/2019. Risk Factors:Hypertension and Diabetes.  Sonographer:    Mikki Santee RDCS (AE) Referring Phys: [place]  1. Left ventricular ejection fraction, by estimation, is 60 to 65%. The left  ventricle has normal function. The left ventricle has no regional wall motion abnormalities. Left ventricular diastolic parameters are consistent with Grade II diastolic dysfunction (pseudonormalization). Elevated left atrial pressure.  2. Right ventricular systolic function is normal. The right ventricular size is normal. There is moderately elevated pulmonary artery systolic pressure.  3. Left atrial size was severely dilated.  4. The mitral valve is abnormal. Moderate mitral valve regurgitation.  5. The aortic valve is tricuspid. Aortic valve regurgitation is trivial. Mild to moderate aortic valve sclerosis/calcification is present, without any evidence of aortic stenosis.  6. Pulmonic valve is difficult to see There is an echobright density on ventricular side of valve Cannot exclude mass/vegetation Would recomm limited TTE exam to evaluate further If inconclusive consider TEE.  7. The inferior vena cava is normal in size with <50% respiratory variability, suggesting right atrial pressure of 8 mmHg. FINDINGS  Left Ventricle: Left ventricular ejection fraction, by estimation, is 60 to 65%. The left ventricle has normal function. The left ventricle has no regional wall motion abnormalities. The left ventricular internal cavity size was normal in size. There is  no left ventricular hypertrophy. Left ventricular diastolic parameters are consistent with Grade II diastolic dysfunction (pseudonormalization). Elevated left atrial pressure. Right Ventricle: The right ventricular size is normal. Right vetricular wall thickness was not assessed. Right ventricular systolic function is normal. There is moderately elevated pulmonary artery systolic pressure. The tricuspid regurgitant velocity is  3.29 m/s, and with an assumed right atrial pressure of 3 mmHg, the estimated right ventricular systolic pressure is 40.9 mmHg. Left Atrium: Left atrial size was severely dilated. Right Atrium: Right atrial size was normal in size.  Pericardium: There is no evidence of pericardial effusion. Mitral Valve: The mitral valve is abnormal. There is mild thickening of the mitral valve leaflet(s). There is mild calcification of the mitral valve leaflet(s). Mild to moderate mitral annular calcification. Moderate mitral valve regurgitation. MV peak gradient, 18.3 mmHg. The mean mitral valve gradient is 8.0 mmHg. Tricuspid Valve: The tricuspid valve is grossly normal. Tricuspid valve regurgitation is mild. Aortic Valve: The aortic valve is tricuspid. Aortic valve regurgitation is trivial. Mild to moderate aortic valve sclerosis/calcification is present, without any evidence of aortic stenosis. Pulmonic Valve: The pulmonic valve was not well visualized. Pulmonic valve regurgitation is not visualized. Aorta: The aortic root is normal in size and structure. Pulmonary Artery: Pulmonic valve is difficult to see There is an echobright density on ventricular side of valve Cannot exclude mass/vegetation Would recomm limited TTE exam to evaluate further If inconclusive consider TEE. Venous: The inferior vena cava is normal in size with less than 50% respiratory variability, suggesting right atrial pressure of 8 mmHg. IAS/Shunts: No atrial level shunt detected by color flow Doppler.  LEFT VENTRICLE PLAX 2D LVIDd:         5.10 cm  Diastology LVIDs:         3.30 cm  LV e' medial:    4.73 cm/s LV PW:  1.00 cm  LV E/e' medial:  37.8 LV IVS:        1.10 cm  LV e' lateral:   5.10 cm/s LVOT diam:     2.00 cm  LV E/e' lateral: 35.1 LV SV:         69 LV SV Index:   41 LVOT Area:     3.14 cm  RIGHT VENTRICLE RV S prime:     10.30 cm/s TAPSE (M-mode): 1.8 cm LEFT ATRIUM              Index       RIGHT ATRIUM           Index LA diam:        4.60 cm  2.72 cm/m  RA Area:     15.00 cm LA Vol (A2C):   148.0 ml 87.42 ml/m RA Volume:   39.20 ml  23.15 ml/m LA Vol (A4C):   98.7 ml  58.30 ml/m LA Biplane Vol: 121.0 ml 71.47 ml/m  AORTIC VALVE LVOT Vmax:   101.00 cm/s LVOT  Vmean:  68.100 cm/s LVOT VTI:    0.219 m  AORTA Ao Root diam: 2.70 cm MITRAL VALVE                TRICUSPID VALVE MV Area (PHT): 4.15 cm     TR Peak grad:   43.3 mmHg MV Area VTI:   1.25 cm     TR Vmax:        329.00 cm/s MV Peak grad:  18.3 mmHg MV Mean grad:  8.0 mmHg     SHUNTS MV Vmax:       2.14 m/s     Systemic VTI:  0.22 m MV Vmean:      132.0 cm/s   Systemic Diam: 2.00 cm MV Decel Time: 183 msec MV E velocity: 179.00 cm/s MV A velocity: 135.00 cm/s MV E/A ratio:  1.33 Dorris Carnes MD Electronically signed by Dorris Carnes MD Signature Date/Time: 07/08/2020/7:58:57 PM    Final         Scheduled Meds: . Chlorhexidine Gluconate Cloth  6 each Topical Q0600  . cholecalciferol  1,000 Units Oral Daily  . enoxaparin (LOVENOX) injection  40 mg Subcutaneous Q24H  . folic acid  1 mg Intravenous Daily  . furosemide  40 mg Intravenous Daily  . insulin aspart  0-9 Units Subcutaneous Q4H  . mouth rinse  15 mL Mouth Rinse BID  . mouth rinse  15 mL Mouth Rinse BID  . metoprolol tartrate  7.5 mg Intravenous Q8H  . mupirocin ointment  1 application Nasal BID  . pantoprazole (PROTONIX) IV  40 mg Intravenous Q12H   Continuous Infusions: . piperacillin-tazobactam (ZOSYN)  IV 3.375 g (07/09/20 0958)     LOS: 1 day    Time spent: 40 minutes    Irine Seal, MD [facility]   To contact the attending provider between 7A-7P or the covering provider during after hours 7P-7A, please log into the web site [email or web address] and access using universal [place] password for that web site. If you do not have the password, please call the hospital operator.  07/09/2020, 10:34 AM

## 2020-07-09 NOTE — [place] Notes (Signed)
  Echocardiogram 2D Echocardiogram has been performed.  Michiel Cowboy 07/09/2020, 3:47 PM

## 2020-07-09 NOTE — [place] Notes (Signed)
Renal artery duplex has been completed. Preliminary results can be found in CV Proc through chart review.   07/09/20 12:46 PM Carlos Levering RVT

## 2020-07-09 NOTE — Anesthesia Procedure Notes (Signed)
Procedure Name: MAC Date/Time: 07/09/2020 12:11 PM Performed by: West Pugh, CRNA Pre-anesthesia Checklist: Patient identified, Emergency Drugs available, Suction available, Patient being monitored and Timeout performed Patient Re-evaluated:Patient Re-evaluated prior to induction Oxygen Delivery Method: Simple face mask Preoxygenation: Pre-oxygenation with 100% oxygen Induction Type: IV induction Placement Confirmation: positive ETCO2 Dental Injury: Teeth and Oropharynx as per pre-operative assessment

## 2020-07-09 NOTE — Op Note (Signed)
[facility] Patient Name: Chloe Harding Procedure Date: 07/09/2020 MRN: [id number] Attending MD: Carol Ada , MD Date of Birth: Feb 18, 1946 CSN: [id number] Age: 75 Admit Type: Inpatient Procedure:                Upper GI endoscopy Indications:              Dysphagia, Abnormal CT of the GI tract Providers:                Carol Ada, MD, Cleda Daub, RN, Lesia Sago, Technician, Arnoldo Hooker, CRNA Referring MD:              Medicines:                Propofol per Anesthesia Complications:            No immediate complications. Estimated Blood Loss:     Estimated blood loss: none. Procedure:                Pre-Anesthesia Assessment:                           - Prior to the procedure, a History and Physical                            was performed, and patient medications and                            allergies were reviewed. The patient's tolerance of                            previous anesthesia was also reviewed. The risks                            and benefits of the procedure and the sedation                            options and risks were discussed with the patient.                            All questions were answered, and informed consent                            was obtained. Prior Anticoagulants: The patient has                            taken no previous anticoagulant or antiplatelet                            agents. ASA Grade Assessment: III - A patient with                            severe systemic disease. After reviewing the risks  and benefits, the patient was deemed in                            satisfactory condition to undergo the procedure.                           - Sedation was administered by an anesthesia                            professional. Deep sedation was attained.                           After obtaining informed consent, the endoscope was                             passed under direct vision. Throughout the                            procedure, the patient's blood pressure, pulse, and                            oxygen saturations were monitored continuously. The                            [id number]-H190 ([id number] Olympus gastroscope was                            introduced through the mouth, and advanced to the                            second part of duodenum. The upper GI endoscopy was                            accomplished without difficulty. The patient                            tolerated the procedure well. Scope In: Scope Out: Findings:      One benign-appearing, intrinsic moderate stenosis was found 30 cm from       the incisors. This stenosis measured 9 mm (inner diameter) x 2 cm (in       length). The stenosis was traversed.      LA Grade D (one or more mucosal breaks involving at least 75% of       esophageal circumference) esophagitis with no bleeding was found in the       lower third of the esophagus.      A 7 cm hiatal hernia was present.      The stomach was normal.      The examined duodenum was normal.      Several nonobstructive proximal esophageal strictures were noted. This       correspoonded to the prior severe esophagitis in this region. There was       no evidence of inflammation at this area. At 30 cm a stricture with       esophageal ulceration was identified. The endoscope was able to pass  through the area was gentle tip deflection. This maneuver also dilated       the area. Impression:               - Benign-appearing esophageal stenosis.                           - LA Grade D reflux esophagitis with no bleeding.                           - 7 cm hiatal hernia.                           - Normal stomach.                           - Normal examined duodenum.                           - No specimens collected. Moderate Sedation:      Not Applicable - Patient had care per Anesthesia. Recommendation:           -  Return patient to hospital ward for ongoing care.                           - Clear liquid diet and advance as tolerated. With                            solid food the food bolus needs to be liquified                            with mastication before swallowing.                           - Continue present medications.                           - PPI BID indefinitely. Procedure Code(s):        --- Professional ---                           [phone], Esophagogastroduodenoscopy, flexible,                            transoral; diagnostic, including collection of                            specimen(s) by brushing or washing, when performed                            (separate procedure) Diagnosis Code(s):        --- Professional ---                           K22.2, Esophageal obstruction                           K21.00, Gastro-esophageal reflux disease with  esophagitis, without bleeding                           K44.9, Diaphragmatic hernia without obstruction or                            gangrene                           R13.10, Dysphagia, unspecified                           R93.3, Abnormal findings on diagnostic imaging of                            other parts of digestive tract CPT copyright 2019 [employer]. All rights reserved. The codes documented in this report are preliminary and upon coder review may  be revised to meet current compliance requirements. Carol Ada, MD Carol Ada, MD 07/09/2020 12:31:46 PM This report has been signed electronically. Number of Addenda: 0

## 2020-07-09 NOTE — Anesthesia Postprocedure Evaluation (Signed)
Anesthesia Post Note  Patient: Chloe Harding  Procedure(s) Performed: ESOPHAGOGASTRODUODENOSCOPY (EGD) WITH PROPOFOL (N/A )     Patient location during evaluation: Endoscopy Anesthesia Type: MAC Level of consciousness: awake and alert Pain management: pain level controlled Vital Signs Assessment: post-procedure vital signs reviewed and stable Respiratory status: spontaneous breathing, nonlabored ventilation, respiratory function stable and patient connected to nasal cannula oxygen Cardiovascular status: stable and blood pressure returned to baseline Postop Assessment: no apparent nausea or vomiting Anesthetic complications: no   No complications documented.  Last Vitals:  Vitals:   07/09/20 1250 07/09/20 1300  BP: (!) 158/79 (!) 163/81  Pulse: 86 86  Resp: 17 20  Temp:    SpO2: 96% 95%    Last Pain:  Vitals:   07/09/20 1300  TempSrc:   PainSc: 0-No pain                 Catalina Gravel

## 2020-07-10 ENCOUNTER — Encounter (HOSPITAL_COMMUNITY): Payer: Self-pay | Admitting: Gastroenterology

## 2020-07-10 DIAGNOSIS — J9601 Acute respiratory failure with hypoxia: Secondary | ICD-10-CM | POA: Diagnosis not present

## 2020-07-10 DIAGNOSIS — I378 Other nonrheumatic pulmonary valve disorders: Secondary | ICD-10-CM

## 2020-07-10 DIAGNOSIS — I5033 Acute on chronic diastolic (congestive) heart failure: Secondary | ICD-10-CM

## 2020-07-10 LAB — CBC WITH DIFFERENTIAL/PLATELET
Abs Immature Granulocytes: 0.01 10*3/uL (ref 0.00–0.07)
Basophils Absolute: 0.1 10*3/uL (ref 0.0–0.1)
Basophils Relative: 1 %
Eosinophils Absolute: 0.7 10*3/uL — ABNORMAL HIGH (ref 0.0–0.5)
Eosinophils Relative: 12 %
HCT: 32.8 % — ABNORMAL LOW (ref 36.0–46.0)
Hemoglobin: 10.4 g/dL — ABNORMAL LOW (ref 12.0–15.0)
Immature Granulocytes: 0 %
Lymphocytes Relative: 45 %
Lymphs Abs: 2.8 10*3/uL (ref 0.7–4.0)
MCH: 27.7 pg (ref 26.0–34.0)
MCHC: 31.7 g/dL (ref 30.0–36.0)
MCV: 87.5 fL (ref 80.0–100.0)
Monocytes Absolute: 0.4 10*3/uL (ref 0.1–1.0)
Monocytes Relative: 7 %
Neutro Abs: 2.2 10*3/uL (ref 1.7–7.7)
Neutrophils Relative %: 35 %
Platelets: 248 10*3/uL (ref 150–400)
RBC: 3.75 MIL/uL — ABNORMAL LOW (ref 3.87–5.11)
RDW: 14.2 % (ref 11.5–15.5)
WBC: 6.2 10*3/uL (ref 4.0–10.5)
nRBC: 0 % (ref 0.0–0.2)

## 2020-07-10 LAB — BASIC METABOLIC PANEL
Anion gap: 13 (ref 5–15)
BUN: 19 mg/dL (ref 8–23)
CO2: 26 mmol/L (ref 22–32)
Calcium: 9.4 mg/dL (ref 8.9–10.3)
Chloride: 99 mmol/L (ref 98–111)
Creatinine, Ser: 1.11 mg/dL — ABNORMAL HIGH (ref 0.44–1.00)
GFR, Estimated: 52 mL/min — ABNORMAL LOW (ref >60)
Glucose, Bld: 89 mg/dL (ref 70–99)
Potassium: 3.9 mmol/L (ref 3.5–5.1)
Sodium: 138 mmol/L (ref 135–145)

## 2020-07-10 LAB — MAGNESIUM: Magnesium: 1.9 mg/dL (ref 1.7–2.4)

## 2020-07-10 MED ORDER — METOPROLOL TARTRATE 5 MG/5ML IV SOLN: 5.0000 mg | Freq: Three times a day (TID) | INTRAVENOUS | Status: DC

## 2020-07-10 MED ORDER — FUROSEMIDE 40 MG PO TABS: 40.0000 mg | ORAL_TABLET | Freq: Every day | ORAL | 0 refills | Status: DC

## 2020-07-10 MED ORDER — AMOXICILLIN-POT CLAVULANATE 875-125 MG PO TABS: 1.0000 | ORAL_TABLET | Freq: Two times a day (BID) | ORAL | Status: DC

## 2020-07-10 MED ORDER — FUROSEMIDE 40 MG PO TABS
40.0000 mg | ORAL_TABLET | Freq: Every day | ORAL | Status: DC
  Administered 2020-07-10: 40 mg via ORAL
  Filled 2020-07-10: qty 1

## 2020-07-10 MED ORDER — ATORVASTATIN CALCIUM 10 MG PO TABS
20.0000 mg | ORAL_TABLET | Freq: Every day | ORAL | Status: DC
  Administered 2020-07-10: 20 mg via ORAL
  Filled 2020-07-10: qty 2

## 2020-07-10 MED ORDER — HYDROCODONE-ACETAMINOPHEN 5-325 MG PO TABS
1.0000 | ORAL_TABLET | Freq: Four times a day (QID) | ORAL | Status: DC | PRN
  Administered 2020-07-10: 1 via ORAL
  Filled 2020-07-10: qty 1

## 2020-07-10 MED ORDER — MELATONIN 5 MG PO TABS: 10.0000 mg | ORAL_TABLET | Freq: Every day | ORAL | Status: DC

## 2020-07-10 MED ORDER — PANTOPRAZOLE SODIUM 40 MG PO TBEC
40.0000 mg | DELAYED_RELEASE_TABLET | Freq: Two times a day (BID) | ORAL | Status: DC
  Administered 2020-07-10: 40 mg via ORAL
  Filled 2020-07-10: qty 1

## 2020-07-10 MED ORDER — FOLIC ACID 1 MG PO TABS
1.0000 mg | ORAL_TABLET | Freq: Every day | ORAL | Status: DC
Start: 2020-07-11 — End: 2020-07-10

## 2020-07-10 MED ORDER — ESCITALOPRAM OXALATE 20 MG PO TABS
20.0000 mg | ORAL_TABLET | Freq: Every day | ORAL | Status: DC
  Administered 2020-07-10: 20 mg via ORAL
  Filled 2020-07-10: qty 1

## 2020-07-10 MED ORDER — AMOXICILLIN-POT CLAVULANATE 875-125 MG PO TABS: 1.0000 | ORAL_TABLET | Freq: Two times a day (BID) | ORAL | 0 refills | Status: AC

## 2020-07-10 MED ORDER — METOPROLOL SUCCINATE ER 25 MG PO TB24
25.0000 mg | ORAL_TABLET | Freq: Every day | ORAL | Status: DC
  Administered 2020-07-10: 25 mg via ORAL
  Filled 2020-07-10: qty 1

## 2020-07-10 NOTE — Progress Notes (Addendum)
Progress Note  Patient Name: Chloe Harding Date of Encounter: 07/10/2020  [facility] Cardiologist: [name], MD   Subjective   Feeling much better today. No CP or SOB. Last BP 124/59.  Inpatient Medications    Scheduled Meds: . Chlorhexidine Gluconate Cloth  6 each Topical Q0600  . cholecalciferol  1,000 Units Oral Daily  . enoxaparin (LOVENOX) injection  40 mg Subcutaneous Q24H  . folic acid  1 mg Intravenous Daily  . mouth rinse  15 mL Mouth Rinse BID  . mouth rinse  15 mL Mouth Rinse BID  . metoprolol tartrate  7.5 mg Intravenous Q8H  . mupirocin ointment  1 application Nasal BID  . pantoprazole (PROTONIX) IV  40 mg Intravenous Q12H   Continuous Infusions: . piperacillin-tazobactam (ZOSYN)  IV Stopped (07/10/20 0734)   PRN Meds: acetaminophen **OR** acetaminophen, hydrALAZINE, labetalol, morphine injection, Muscle Rub, ondansetron **OR** ondansetron (ZOFRAN) IV, polyethylene glycol   Vital Signs    Vitals:   07/10/20 0600 07/10/20 0700 07/10/20 0800 07/10/20 0810  BP: 133/68 (!) 174/78 (!) 142/68   Pulse: 85 76 (!) 57   Resp: 20 20 13    Temp:    98.1 F (36.7 C)  TempSrc:    Oral  SpO2: 94% 96% 99%   Weight:      Height:        Intake/Output Summary (Last 24 hours) at 07/10/2020 0936 Last data filed at 07/10/2020 0700 Gross per 24 hour  Intake 363.92 ml  Output 2650 ml  Net -2286.08 ml   Last 3 Weights 07/10/2020 07/09/2020 07/08/2020  Weight (lbs) 151 lb 0.2 oz 156 lb 158 lb 15.2 oz  Weight (kg) 68.5 kg 70.761 kg 72.1 kg      Telemetry    SB mid 50s-60s - Personally Reviewed  Physical Exam   GEN: No acute distress.   Neck: No JVD Cardiac: RRR, no murmurs, rubs, or gallops.  Respiratory: Slightly diminished BS, coarse with faint occasional wheezing on auscultation. GI: Soft, nontender, non-distended  MS: No edema; No deformity. Neuro:  Nonfocal  Psych: Normal affect   Labs    High Sensitivity Troponin:   Recent Labs  Lab  07/07/20 1930 07/07/20 2037  TROPONINIHS 12 17      Chemistry Recent Labs  Lab 07/07/20 1930 07/07/20 1946 07/08/20 0215 07/09/20 0248 07/10/20 0234  NA 137   < > 136 141 138  K 4.1   < > 4.1 3.6 3.9  CL 102   < > 102 102 99  CO2 24  --  23 26 26   GLUCOSE 180*   < > 209* 116* 89  BUN 10   < > 11 18 19   CREATININE 0.64   < > 0.71 0.96 1.11*  CALCIUM 9.0  --  9.3 10.0 9.4  PROT 7.4  --   --  6.9  --   ALBUMIN 3.8  --   --  3.8  --   AST 17  --   --  19  --   ALT 8  --   --  9  --   ALKPHOS 92  --   --  73  --   BILITOT 0.7  --   --  1.2  --   GFRNONAA >60  --  >60 >60 52*  ANIONGAP 11  --  11 13 13    < > = values in this interval not displayed.     Hematology Recent Labs  Lab 07/08/20 306 377 0323  07/09/20 0248 07/10/20 0234  WBC 3.7* 6.7 6.2  RBC 3.80* 3.48* 3.75*  HGB 10.6* 9.8* 10.4*  HCT 32.9* 30.1* 32.8*  MCV 86.6 86.5 87.5  MCH 27.9 28.2 27.7  MCHC 32.2 32.6 31.7  RDW 14.1 14.1 14.2  PLT 230 243 248    BNP Recent Labs  Lab 07/07/20 1930  BNP 381.5*     DDimer  Recent Labs  Lab 07/07/20 2037  DDIMER 1.11*     Radiology    ECHOCARDIOGRAM COMPLETE  Result Date: 07/08/2020    ECHOCARDIOGRAM REPORT   Patient Name:   Chloe Harding Date of Exam: 07/08/2020 Medical Rec #:  [id number]      Height:       60.0 in Accession #:    [id number]     Weight:       159.0 lb Date of Birth:  04/16/1946     BSA:          1.693 m Patient Age:    75 years       BP:           172/79 mmHg Patient Gender: F              HR:           79 bpm. Exam Location:  Inpatient Procedure: 2D Echo Indications:    CHF-Acute Systolic D66.44  History:        Patient has prior history of Echocardiogram examinations, most                 recent 11/06/2019. Risk Factors:Hypertension and Diabetes.  Sonographer:    Mikki Santee RDCS (AE) Referring Phys: [place]  1. Left ventricular ejection fraction, by estimation, is 60 to 65%. The left ventricle has normal function.  The left ventricle has no regional wall motion abnormalities. Left ventricular diastolic parameters are consistent with Grade II diastolic dysfunction (pseudonormalization). Elevated left atrial pressure.  2. Right ventricular systolic function is normal. The right ventricular size is normal. There is moderately elevated pulmonary artery systolic pressure.  3. Left atrial size was severely dilated.  4. The mitral valve is abnormal. Moderate mitral valve regurgitation.  5. The aortic valve is tricuspid. Aortic valve regurgitation is trivial. Mild to moderate aortic valve sclerosis/calcification is present, without any evidence of aortic stenosis.  6. Pulmonic valve is difficult to see There is an echobright density on ventricular side of valve Cannot exclude mass/vegetation Would recomm limited TTE exam to evaluate further If inconclusive consider TEE.  7. The inferior vena cava is normal in size with <50% respiratory variability, suggesting right atrial pressure of 8 mmHg. FINDINGS  Left Ventricle: Left ventricular ejection fraction, by estimation, is 60 to 65%. The left ventricle has normal function. The left ventricle has no regional wall motion abnormalities. The left ventricular internal cavity size was normal in size. There is  no left ventricular hypertrophy. Left ventricular diastolic parameters are consistent with Grade II diastolic dysfunction (pseudonormalization). Elevated left atrial pressure. Right Ventricle: The right ventricular size is normal. Right vetricular wall thickness was not assessed. Right ventricular systolic function is normal. There is moderately elevated pulmonary artery systolic pressure. The tricuspid regurgitant velocity is  3.29 m/s, and with an assumed right atrial pressure of 3 mmHg, the estimated right ventricular systolic pressure is 03.4 mmHg. Left Atrium: Left atrial size was severely dilated. Right Atrium: Right atrial size was normal in size. Pericardium: There is no evidence  of pericardial  effusion. Mitral Valve: The mitral valve is abnormal. There is mild thickening of the mitral valve leaflet(s). There is mild calcification of the mitral valve leaflet(s). Mild to moderate mitral annular calcification. Moderate mitral valve regurgitation. MV peak gradient, 18.3 mmHg. The mean mitral valve gradient is 8.0 mmHg. Tricuspid Valve: The tricuspid valve is grossly normal. Tricuspid valve regurgitation is mild. Aortic Valve: The aortic valve is tricuspid. Aortic valve regurgitation is trivial. Mild to moderate aortic valve sclerosis/calcification is present, without any evidence of aortic stenosis. Pulmonic Valve: The pulmonic valve was not well visualized. Pulmonic valve regurgitation is not visualized. Aorta: The aortic root is normal in size and structure. Pulmonary Artery: Pulmonic valve is difficult to see There is an echobright density on ventricular side of valve Cannot exclude mass/vegetation Would recomm limited TTE exam to evaluate further If inconclusive consider TEE. Venous: The inferior vena cava is normal in size with less than 50% respiratory variability, suggesting right atrial pressure of 8 mmHg. IAS/Shunts: No atrial level shunt detected by color flow Doppler.  LEFT VENTRICLE PLAX 2D LVIDd:         5.10 cm  Diastology LVIDs:         3.30 cm  LV e' medial:    4.73 cm/s LV PW:         1.00 cm  LV E/e' medial:  37.8 LV IVS:        1.10 cm  LV e' lateral:   5.10 cm/s LVOT diam:     2.00 cm  LV E/e' lateral: 35.1 LV SV:         69 LV SV Index:   41 LVOT Area:     3.14 cm  RIGHT VENTRICLE RV S prime:     10.30 cm/s TAPSE (M-mode): 1.8 cm LEFT ATRIUM              Index       RIGHT ATRIUM           Index LA diam:        4.60 cm  2.72 cm/m  RA Area:     15.00 cm LA Vol (A2C):   148.0 ml 87.42 ml/m RA Volume:   39.20 ml  23.15 ml/m LA Vol (A4C):   98.7 ml  58.30 ml/m LA Biplane Vol: 121.0 ml 71.47 ml/m  AORTIC VALVE LVOT Vmax:   101.00 cm/s LVOT Vmean:  68.100 cm/s LVOT VTI:     0.219 m  AORTA Ao Root diam: 2.70 cm MITRAL VALVE                TRICUSPID VALVE MV Area (PHT): 4.15 cm     TR Peak grad:   43.3 mmHg MV Area VTI:   1.25 cm     TR Vmax:        329.00 cm/s MV Peak grad:  18.3 mmHg MV Mean grad:  8.0 mmHg     SHUNTS MV Vmax:       2.14 m/s     Systemic VTI:  0.22 m MV Vmean:      132.0 cm/s   Systemic Diam: 2.00 cm MV Decel Time: 183 msec MV E velocity: 179.00 cm/s MV A velocity: 135.00 cm/s MV E/A ratio:  1.33 Dorris Carnes MD Electronically signed by Dorris Carnes MD Signature Date/Time: 07/08/2020/7:58:57 PM    Final    US THYROID  Result Date: 07/09/2020 CLINICAL DATA:  Nodule on previous CT. EXAM: THYROID ULTRASOUND TECHNIQUE: Ultrasound examination of the thyroid gland  and adjacent soft tissues was performed. COMPARISON:  01/15/2019 FINDINGS: Parenchymal Echotexture: Moderately heterogenous Isthmus: 0.3 cm thickness, previously 0.2 Right lobe: 5.2 x 2.8 x 3 cm, previously 4.3 x 2.7 x 3 Left lobe: 4 x 1.1 x 1.2 cm, previously 4.5 x 1.3 x 1.1 _________________________________________________________ Estimated total number of nodules >/= 1 cm: 2 Number of spongiform nodules >/=  2 cm not described below (TR1): 0 Number of mixed cystic and solid nodules >/= 1.5 cm not described below ([place]): 0 _________________________________________________________ Nodule # 1: Prior biopsy: No Location: Right ; Superior Maximum size: 3.3 cm; Other 2 dimensions: 2.5 x 2.6 cm, previously, 2.6 x 2.3 x 2.5 cm Composition: solid/almost completely solid (2) Echogenicity: hypoechoic (2) Shape: not taller-than-wide (0) Margins: smooth (0) Echogenic foci: none (0) ACR TI-RADS total points: 4. ACR TI-RADS risk category:  TR4 (4-6 points). Significant change in size (>/= 20% in two dimensions and minimal increase of 2 mm): No Change in features: No Change in ACR TI-RADS risk category: No ACR TI-RADS recommendations: **Given size (>/= 1.5 cm) and appearance, fine needle aspiration of this moderately suspicious  nodule should be considered based on TI-RADS criteria. _________________________________________________________ Nodule # 2: Prior biopsy: No Location: Right; Superior posterior Maximum size: 1.6 cm; Other 2 dimensions: 1 x 1.4 cm, previously, 1.6 x 1.3 x 1.4 cm Composition: solid/almost completely solid (2) Echogenicity: hypoechoic (2) Shape: not taller-than-wide (0) Margins: smooth (0) Echogenic foci: none (0) ACR TI-RADS total points: 4. ACR TI-RADS risk category:  TR4 (4-6 points). Significant change in size (>/= 20% in two dimensions and minimal increase of 2 mm): No Change in features: No Change in ACR TI-RADS risk category: No ACR TI-RADS recommendations: **Given size (>/= 1.5 cm) and appearance, fine needle aspiration of this moderately suspicious nodule should be considered based on TI-RADS criteria. _________________________________________________________ Nodule # 3: 0.4 cm benign colloid cyst, mid left. IMPRESSION: Mild thyromegaly with stable right nodules. Both meet criteria for FNA biopsy, if not already performed. The above is in keeping with the ACR TI-RADS recommendations - J Am Coll Radiol 2017;14:587-595. Electronically Signed   By: Lucrezia Europe M.D.   On: 07/09/2020 15:27   VAS US RENAL ARTERY DUPLEX  Result Date: 07/09/2020 ABDOMINAL VISCERAL Indications: Flash pulmonary edema High Risk Factors: Hypertension. Limitations: Air/bowel gas and obesity. Comparison Study: No prior studies. Performing Technologist: Oliver Hum RVT  Examination Guidelines: A complete evaluation includes B-mode imaging, spectral Doppler, color Doppler, and power Doppler as needed of all accessible portions of each vessel. Bilateral testing is considered an integral part of a complete examination. Limited examinations for reoccurring indications may be performed as noted.  Duplex Findings: +--------------------+--------+--------+------+-------------------+ Mesenteric          PSV cm/sEDV cm/sPlaque      Comments       +--------------------+--------+--------+------+-------------------+ Aorta Distal          178                                     +--------------------+--------+--------+------+-------------------+ Celiac Artery Origin                      Unable to visualize +--------------------+--------+--------+------+-------------------+ SMA Origin                                Unable to visualize +--------------------+--------+--------+------+-------------------+    +------------------+--------+--------+-------+  Right Renal ArteryPSV cm/sEDV cm/sComment +------------------+--------+--------+-------+ Origin              130      21           +------------------+--------+--------+-------+ Proximal            254      24           +------------------+--------+--------+-------+ Mid                  90      18           +------------------+--------+--------+-------+ Distal               65      13           +------------------+--------+--------+-------+ +-----------------+--------+--------+-------+ Left Renal ArteryPSV cm/sEDV cm/sComment +-----------------+--------+--------+-------+ Origin             183      14           +-----------------+--------+--------+-------+ Proximal            85      10           +-----------------+--------+--------+-------+ Mid                135      17           +-----------------+--------+--------+-------+ Distal              31      6            +-----------------+--------+--------+-------+ +------------+--------+--------+----+-----------+--------+--------+----+ Right KidneyPSV cm/sEDV cm/sRI  Left KidneyPSV cm/sEDV cm/sRI   +------------+--------+--------+----+-----------+--------+--------+----+ Upper Pole  24      6       0.75Upper Pole 27      6       0.78 +------------+--------+--------+----+-----------+--------+--------+----+ Mid         18      6       0.68Mid        20      5        0.76 +------------+--------+--------+----+-----------+--------+--------+----+ Lower Pole  19      6       0.69Lower Pole 27      6       0.77 +------------+--------+--------+----+-----------+--------+--------+----+ Hilar       38      8       0.79Hilar      79      14      0.82 +------------+--------+--------+----+-----------+--------+--------+----+ +------------------+----+------------------+-----+ Right Kidney          Left Kidney             +------------------+----+------------------+-----+ RAR                   RAR                     +------------------+----+------------------+-----+ RAR (manual)      1.43RAR (manual)      1.03  +------------------+----+------------------+-----+ Cortex                Cortex                  +------------------+----+------------------+-----+ Cortex thickness      Corex thickness         +------------------+----+------------------+-----+ Kidney length (cm)9.00Kidney length (cm)10.50 +------------------+----+------------------+-----+  Summary: Renal:  Right: Normal size right kidney. 1-59% stenosis of the right renal  artery. Normal right Resisitive Index. Left:  Normal size of left kidney. 1-59% stenosis of the left renal        artery. Normal left Resistive Index.  *See table(s) above for measurements and observations.  Diagnosing physician: Deitra Mayo MD  Electronically signed by Deitra Mayo MD on 07/09/2020 at 6:30:17 PM.    Final    ECHOCARDIOGRAM LIMITED  Result Date: 07/09/2020    ECHOCARDIOGRAM LIMITED REPORT   Patient Name:   Chloe Harding Date of Exam: 07/09/2020 Medical Rec #:  [id number]      Height:       60.0 in Accession #:    [id number]     Weight:       156.0 lb Date of Birth:  08/02/1945     BSA:          1.680 m Patient Age:    7 years       BP:           163/81 mmHg Patient Gender: F              HR:           96 bpm. 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 CHF; Risk Factors:Hypertension, Dyslipidemia,                 Diabetes and Non-Smoker.  Sonographer:    Vickie Epley RDCS Referring Phys: [id number] PATRICK HUNG  Conclusion(s)/Recommendation(s): Limited study to assess pulmonic valve. There appears to be a small area of the valve that is likely calcified (see image 6), and there is a small area in the RVOT that appears to be a filamentous structure. Compared to prior images from yesterday, the mass like/echobright region previously seen is not well appreciated. This area was not commented on in recent CTPE as well. Could consider evaluation with TEE, though RVOT is most distal structure to TEE probe and difficult to fully evaluate. If there is significant clinical concern, could also consider cardiac MRI. Otherwise would recommend follow up TTE as an outpatient. Buford Dresser MD Electronically signed by Buford Dresser MD Signature Date/Time: 07/09/2020/8:22:55 PM    Final    Limited echo 07/09/20   Conclusion(s)/Recommendation(s): Limited study to assess pulmonic valve.  There appears to be a small area of the valve that is likely calcified  (see image 6), and there is a small area in the RVOT that appears to be a  filamentous structure. Compared to  prior images from yesterday, the mass like/echobright region previously  seen is not well appreciated. This area was not commented on in recent  CTPE as well. Could consider evaluation with TEE, though RVOT is most  distal structure to TEE probe and  difficult to fully evaluate. If there is significant clinical concern,  could also consider cardiac MRI. Otherwise would recommend follow up TTE  as an outpatient.   Cardiac Studies   2d echo 07/08/20 1. Left ventricular ejection fraction, by estimation, is 60 to 65%. The  left ventricle has normal  function. The left ventricle has no regional  wall motion abnormalities. Left ventricular diastolic parameters are  consistent with Grade II diastolic  dysfunction (pseudonormalization). Elevated left atrial pressure.  2. Right ventricular systolic function is  normal. The right ventricular  size is normal. There is moderately elevated pulmonary artery systolic  pressure.  3. Left atrial size was severely dilated.  4. The mitral valve is abnormal. Moderate mitral valve regurgitation.  5. The aortic valve is tricuspid. Aortic valve regurgitation is trivial.  Mild to moderate aortic valve sclerosis/calcification is present, without  any evidence of aortic stenosis.  6. Pulmonic valve is difficult to see There is an echobright density on  ventricular side of valve Cannot exclude mass/vegetation Would recomm  limited TTE exam to evaluate further If inconclusive consider TEE.  7. The inferior vena cava is normal in size with <50% respiratory  variability, suggesting right atrial pressure of 8 mmHg.   Patient Profile     75 y.o. female with a hx of chronic combined CHF, coronary artery calcifications with prior demand ischemia managed medically, HTN, DM, morbid obesity, urinary retention with indwelling foley catheter, hiatal hernia, thyroid nodule, aortic atherosclerosis, subdural hematoma. Complex PMH as outlined in consult note. Recently developed SOB, DOE, orthopnea and nonproductive cough x 2 weeks. Admitted with hypoxia/HTN treated with inhalers, IV steroids, IV NTG, antibiotics and Lasix with rapid improvement. CTA showed no PE but did show chronic elevation of the right hemidiaphragm with volume loss +/- PNA in the RLL, dilated esophagus with fluid, air and bowel contents, aortic + coronary atherosclerosis. Due to esophageal issue, GI consulted who found benign appearing esophageal stenosis (the stenosis was traversed), grade D reflux esophagitis and 7cm hiatal hernia.  Assessment & Plan     1. Acute respiratory failure with hypoxia, likely multifactorial in the context of possible pneumonia and acute on chronic combined CHF with accelerated HTN - BP still ranging 130s-170s per [facility] but down to 124/59 this AM - suspect HTN was acute event driven by respiratory insufficiency - remains on metoprolol suspension - will drop dose to 5mg  q8hr given tendency for HR in the 50s at times - would restart home Toprol 25mg  daily when reliably taking orals - appears euvolemic so hold further IV Lasix - will start PO lasix 40 mg daily - renal duplex negative for hemodynamically significant RAS - on midodrine at home, would continue to hold at DC - abx per IM  2. Acute on chronic combined CHF - managed in context of the above - with bowel contents getting stuck in esophagus, question the adequacy of absorption of her home regimen recently - 2D echo 07/08/20 with EF 60-65%, grade 1 DD, moderate MR, normal IVC - question of echobright density in pulmonic valve with repeat limited echo: "There appears to be a small area of the valve that is likely calcified (see image 6), and there is a small area in the RVOT that appears to be a filamentous structure. Compared to prior images from yesterday, the mass like/echobright region previously seen is not well appreciated. This area was not commented on in recent CTPE as well. Could consider evaluation with TEE, though RVOT is most distal structure to TEE probe and difficult to fully evaluate. If there is significant clinical concern, could also consider cardiac MRI. Otherwise would recommend follow up TTE as an outpatient." - will review further with MD - TEE may be challenging with her esophageal stricture (note states this was traversed but do not see formal dilation)  3. H/o esophageal stricture - endoscopic findings as outlined above, do not see mention of dilation  4. Coronary artery calcification seen on CT previously - not on antiplatelet agents as  outpatient, also  has h/o fall with subdural hematoma so is poor candidate for any invasive ischemic evaluation - hsTroponins negative and reassuring  - see above re :BB - restart of statin can be considered once swallowing issues resolved  5. Thyroid nodule - see IM notes regarding prior biopsy and continued OP follow-up - TFTs wnl  Per nurse, IM planning to potentially DC today. Will review final recs with Md. Have arranged f/u 3/17 in our office with APP but would also suggest f/u with primary care within a week as well.  [facility] will sign off.   Medication Recommendations: Atorvastatin 20 mg daily, Toprol-XL 25 mg daily, Lasix 40 mg daily Other recommendations (labs, testing, etc): BMET within 1 week Follow up as an outpatient: Follow-up scheduled for 3/17   For questions or updates, please contact [place] Please consult [email or web address] for contact info under   Signed, Charlie Pitter, PA-C  07/10/2020, 9:36 AM    Patient seen and examined.  Agree with above documentation.  On exam, patient is alert and oriented, regular rate and rhythm, no murmurs, scattered expiratory wheezing, no LE edema or JVD.  Appears euvolemic, will switch to p.o. Lasix 40 mg daily.  Limited echo was done yesterday as there was concern for possible pulmonic valve mass.  Think this was likely artifact, not seen on limited repeat echo.  CTPA was unremarkable.  Blood cultures are negative, arguing against endocarditis.  Could consider cardiac MRI as an outpatient, but no further work-up recommended at this time.  She is scheduled for outpatient cardiology follow-up.  Donato Heinz, MD

## 2020-07-10 NOTE — Evaluation (Signed)
Physical Therapy Evaluation Patient Details Name: Chloe Harding MRN: [id number] DOB: 05/09/1946 Today's Date: 07/10/2020   History of Present Illness  "HPI: Chloe Harding is a 75 y.o. female with medical history significant for chronic combined systolic and diastolic CHF (EF 46-56%, G1 DD, LV global hypokinesis by TTE 11/06/2019), hypertension, hyperlipidemia, rheumatoid arthritis, depression, dysphagia and history of esophageal stenosis s/p dilation 01/25/2020 who presents to the ED for evaluation of shortness of breath,Portable chest x-ray shows mild right basilar opacity, representing atelectasis versus developing infection.Patient  with  history of esophageal stricture, GI was consulted.  Patient underwent EGD and dilatation on 2/22.  Patient's respiratory failure resolved and remained stable on room air.  Clinical Impression  Patient is eager to go home today. Patient  mobilized with  Supervision/ min guard in room. Patient  Reports intermittently use of Rw or cane, PRN. No further PT needs.    Follow Up Recommendations No PT follow up    Equipment Recommendations  None recommended by PT    Recommendations for Other Services       Precautions / Restrictions Precautions Precautions: Fall      Mobility  Bed Mobility Overal bed mobility: Modified Independent                  Transfers Overall transfer level: Needs assistance Equipment used: None Transfers: Sit to/from Stand Sit to Stand: Min guard            Ambulation/Gait Ambulation/Gait assistance: Min guard Gait Distance (Feet): 30 Feet Assistive device: None Gait Pattern/deviations: Step-through pattern     General Gait Details: intermittently supports on objects.  Stairs            Wheelchair Mobility    Modified Rankin (Stroke Patients Only)       Balance Overall balance assessment: No apparent balance deficits (not formally assessed)                                            Pertinent Vitals/Pain Pain Assessment: No/denies pain    Home Living Family/patient expects to be discharged to:: Private residence Living Arrangements: Spouse/significant other Available Help at Discharge: Available PRN/intermittently Type of Home: House Home Access: Ramped entrance     Home Layout: One level Home Equipment: Shower seat - built in;Grab bars - tub/shower;Grab bars - toilet;Walker - 2 wheels;Cane - single point Additional Comments: has a lift chair    Prior Function Level of Independence: Independent with assistive device(s)   Gait / Transfers Assistance Needed: amb with cane or Rw, depending on need  ADL's / Homemaking Assistance Needed: patient cooks and cleans now        Hand Dominance        Extremity/Trunk Assessment   Upper Extremity Assessment Upper Extremity Assessment: Generalized weakness    Lower Extremity Assessment Lower Extremity Assessment: Generalized weakness    Cervical / Trunk Assessment Cervical / Trunk Assessment: Normal  Communication      Cognition Arousal/Alertness: Awake/alert Behavior During Therapy: WFL for tasks assessed/performed Overall Cognitive Status: Within Functional Limits for tasks assessed                                        General Comments      Exercises  Assessment/Plan    PT Assessment Patent does not need any further PT services  PT Problem List         PT Treatment Interventions      PT Goals (Current goals can be found in the Care Plan section)  Acute Rehab PT Goals Patient Stated Goal: go home PT Goal Formulation: All assessment and education complete, DC therapy    Frequency     Barriers to discharge        Co-evaluation               AM-PAC PT "6 Clicks" Mobility  Outcome Measure Help needed turning from your back to your side while in a flat bed without using bedrails?: None Help needed moving from lying on your back to sitting on the  side of a flat bed without using bedrails?: None Help needed moving to and from a bed to a chair (including a wheelchair)?: A Little Help needed standing up from a chair using your arms (e.g., wheelchair or bedside chair)?: A Little Help needed to walk in hospital room?: A Little Help needed climbing 3-5 steps with a railing? : A Little 6 Click Score: 20    End of Session   Activity Tolerance: Patient tolerated treatment well Patient left: in chair;with call bell/phone within reach;with family/visitor present Nurse Communication: Mobility status PT Visit Diagnosis: Difficulty in walking, not elsewhere classified (R26.2)    Time: 3893-7342 PT Time Calculation (min) (ACUTE ONLY): 17 min   Charges:   PT Evaluation $PT Eval Low Complexity: [place] Pager [phone] Office [phone]   Claretha Cooper 07/10/2020, 3:46 PM

## 2020-07-10 NOTE — Discharge Summary (Signed)
Physician Discharge Summary  Chloe Harding PZW:[id number] DOB: 12/06/1945 DOA: 07/07/2020  PCP: [name], NP  Admit date: 07/07/2020 Discharge date: 07/10/2020  Admitted From: Home Disposition:  Home  Recommendations for Outpatient Follow-up:  1. Follow up with PCP in 1 week 2. Follow up with cardiology as scheduled on 08/01/2020 3. Follow-up with GI  Discharge Condition: Stable CODE STATUS: Full code Diet recommendation: Heart healthy   Brief/Interim Summary: From H&P by Dr. Zada Finders: "HPI: Chloe Harding is a 75 y.o. female with medical history significant for chronic combined systolic and diastolic CHF (EF 42-35%, G1 DD, LV global hypokinesis by TTE 11/09/2019), hypertension, hyperlipidemia, rheumatoid arthritis, depression, dysphagia and history of esophageal stenosis s/p dilation 01/25/2020 who presents to the ED for evaluation of shortness of breath.  Patient states she has been having 2 weeks of progressive shortness of breath worse with exertion but now occurring even at rest.  She reported experiencing orthopnea last night and had to sleep sitting up in her recliner.  She has had an associated nonproductive cough.  She had sudden severe worsening of her dyspnea earlier today and called EMS.  She was placed on CPAP on route to the ED.  Patient otherwise denies any subjective fevers, chills, diaphoresis, chest pain, palpitations, abdominal pain, nausea, vomiting, dysuria, or lower extremity swelling.  She reports her weights have been up and down.  ED Course:  Initial vitals showed BP 197/84, pulse 110, RR 25, temp 97.7 F, SPO2 100% while on BiPAP.  Labs show WBC 7.2, hemoglobin 10.8, platelets 247,000, sodium 137, potassium 4.1, bicarb 24, BUN 10, creatinine 0.64, serum glucose 180, LFTs within normal limits, high-sensitivity troponin I 12, BNP 381.5.  SARS-CoV-2 PCR is negative.  Influenza A/B PCR's are negative.  Portable chest x-ray shows mild right basilar  opacity, representing atelectasis versus developing infection.  Patient was placed on BiPAP with improvement in respiratory status.  She was given albuterol inhaler and IV Solu-Medrol 125 mg.  She was placed on IV nitroglycerin with improvement in blood pressure.  She was also given IV ceftriaxone and ordered to receive IV Lasix 40 mg once.  The hospitalist service was consulted to admit for further evaluation and management"  CT angiogram chest ordered which was negative for PE, chronic elevation of right hemidiaphragm with volume loss and no pneumonia in the right lower lobe more pronounced than seen in 11/09/2019, dilated esophagus with fluid, and bowel contents.  Aortic atherosclerosis.  Coronary artery calcification.  Echocardiogram revealed EF 60%, echo bright density on pulmonic valve.  Limited 2D echo was completed results as below.  Cardiology followed patient, patient was diuresed, transitioned to p.o. due to dilated esophagus with history of esophageal stricture, GI was consulted.  Patient underwent EGD and dilatation on 2/22.  Patient's respiratory failure resolved and remained stable on room air.  Discharge Diagnoses:  Principal Problem:   Acute respiratory failure ([place]) Active Problems:   Major depression, recurrent ([place])   GERD   Pure hypercholesterolemia   Esophageal stricture   Hypertensive emergency   Acute on chronic combined systolic and diastolic CHF (congestive heart failure) (H[facility])   Aspiration pneumonia ([facility])   Esophageal dilatation   Hypomagnesemia    1. Acute hypoxemic respiratory failure, multifactorial secondary to probable aspiration pneumonia and flash pulmonary edema/acute on chronic combined systolic and diastolic heart failure -Patient presenting in acute respiratory failure noted to be in hypertensive urgency and initially placed on the BiPAP.  Patient noted to have had some  improvement in respiratory symptoms on BiPAP, ultimately weaned to room air -Chest  x-ray concern for left base atelectasis versus developing infiltrate.  CT angiogram chest done due to elevated D-dimer and tachycardia was negative for PE however concerning for dilated esophagus with fluid and bowel contents.  Patient likely with an aspiration event.  Patient with history of esophageal stricture.   -2D echo with EF of 60 to 65%, no wall motion abnormalities, grade 2 diastolic dysfunction, elevated left atrial pressure, severely dilated left atrial size, moderate mitral valve regurgitation, echo bright density on ventricular side of pulmonic valve cannot exclude mass/vegetation.  Limited 2D echo showed likely calcified area of the pulmonic valve, could consider cardiac MRI -Appreciate cardiology, patient diuresed well on IV Lasix, transition to p.o.  2.  Dilated esophagus with fluid and bowel contents -Noted on CT angiogram chest.  Patient with a history of chronic dysphagia and esophageal stricture status post dilatation 01/25/2020.  Patient at that time noted to have had an esophageal ulcer and placed on PPI twice daily which she states she is compliant with.   -Status post EGD 2/22 which revealed benign-appearing esophageal stenosis  3.  Hypertensive emergency -Patient noted to have hypertensive urgency with elevated blood pressure.  Patient initially placed on nitroglycerin infusion which has been discontinued.    Continue Toprol, Lasix  4.  Hyperlipidemia -Continue atorvastatin  5.  Probable aspiration pneumonia -Chest x-ray done on admission concerning for right base atelectasis versus developing infection.  CT angiogram chest done with dilated esophagus with fluid and bowel contents noted, chronic elevation of right hemidiaphragm with volume loss and no pneumonia in the right lower lobe.    IV Zosyn --> Augmentin  6.  Rheumatoid arthritis -On Plaquenil as outpatient   7.  Depression/anxiety -Lexapro  8.  Restless leg syndrome -Ropinirole   9. Thyroid  nodule -Patient noted to have a thyroid ultrasound done 01/15/2019 with mildly suspicious nodule which meet criteria for biopsy, second nodule for follow-up in a year.  Patient states nodule was biopsied in the past and told it was benign.  TSH within normal limits. Further work-up in the outpatient setting.   Discharge Instructions  Discharge Instructions    (HEART FAILURE PATIENTS) Call MD:  Anytime you have any of the following symptoms: 1) 3 pound weight gain in 24 hours or 5 pounds in 1 week 2) shortness of breath, with or without a dry hacking cough 3) swelling in the hands, feet or stomach 4) if you have to sleep on extra pillows at night in order to breathe.   Complete by: As directed    Call MD for:  difficulty breathing, headache or visual disturbances   Complete by: As directed    Call MD for:  extreme fatigue   Complete by: As directed    Call MD for:  persistant dizziness or light-headedness   Complete by: As directed    Call MD for:  persistant nausea and vomiting   Complete by: As directed    Call MD for:  severe uncontrolled pain   Complete by: As directed    Call MD for:  temperature >100.4   Complete by: As directed    Discharge instructions   Complete by: As directed    You were cared for by a hospitalist during your hospital stay. If you have any questions about your discharge medications or the care you received while you were in the hospital after you are discharged, you can call the unit  and ask to speak with the hospitalist on call if the hospitalist that took care of you is not available. Once you are discharged, your primary care physician will handle any further medical issues. Please note that NO REFILLS for any discharge medications will be authorized once you are discharged, as it is imperative that you return to your primary care physician (or establish a relationship with a primary care physician if you do not have one) for your aftercare needs so that they can  reassess your need for medications and monitor your lab values.   Increase activity slowly   Complete by: As directed      Allergies as of 07/10/2020      Reactions   Amlodipine Besylate Swelling   swelling      Medication List    STOP taking these medications   azithromycin 250 MG tablet Commonly known as: ZITHROMAX   multivitamin with minerals Tabs tablet   potassium chloride 10 MEQ tablet Commonly known as: KLOR-CON   Vitamin D3 25 MCG tablet Commonly known as: Vitamin D     TAKE these medications   albuterol 108 (90 Base) MCG/ACT inhaler Commonly known as: VENTOLIN HFA Inhale 1-2 puffs into the lungs every 4 (four) hours as needed for wheezing or shortness of breath.   amoxicillin-clavulanate 875-125 MG tablet Commonly known as: AUGMENTIN Take 1 tablet by mouth every 12 (twelve) hours for 5 days.   atorvastatin 20 MG tablet Commonly known as: LIPITOR Take 1 tablet (20 mg total) by mouth daily.   escitalopram 20 MG tablet Commonly known as: LEXAPRO Take 20 mg by mouth daily. What changed: Another medication with the same name was removed. Continue taking this medication, and follow the directions you see here.   folic acid 1 MG tablet Commonly known as: FOLVITE Take 1 tablet (1 mg total) by mouth daily.   furosemide 40 MG tablet Commonly known as: LASIX Take 1 tablet (40 mg total) by mouth daily. What changed:   when to take this  reasons to take this   HYDROcodone-acetaminophen 5-325 MG tablet Commonly known as: NORCO/VICODIN Take 1 tablet by mouth 4 (four) times daily as needed for moderate pain.   hydroxychloroquine 200 MG tablet Commonly known as: PLAQUENIL Take 1 tablet (200 mg total) by mouth daily.   lactulose 10 GM/15ML solution Commonly known as: CHRONULAC Take 10-20 g by mouth daily as needed for mild constipation.   melatonin 5 MG Tabs Take 10 mg by mouth at bedtime.   metoprolol succinate 25 MG 24 hr tablet Commonly known as:  TOPROL-XL Take 25 mg by mouth daily.   pantoprazole 40 MG tablet Commonly known as: Protonix Take 1 tablet (40 mg total) by mouth 2 (two) times daily.   potassium chloride 10 MEQ tablet Commonly known as: KLOR-CON Take 10 mEq by mouth daily.   rOPINIRole 0.5 MG tablet Commonly known as: REQUIP Take 0.5 mg by mouth at bedtime. What changed: Another medication with the same name was removed. Continue taking this medication, and follow the directions you see here.   Tussin 100 MG/5ML syrup Generic drug: guaifenesin Take 200 mg by mouth 3 (three) times daily as needed for cough.       Follow-up Information    Marilynn Rail Jossie Ng, NP Follow up.   Specialty: Cardiology Why: [facility] (Cardiology) - [facility] location - Thursday August 01, 2020 at 8:15 AM (Arrive by 8:00 AM). Denyse Amass is one of our nurse practitioners with the cardiology team. Contact  information: [address] [place] [address] [phone]        Carol Ada, MD Follow up.   Specialty: Gastroenterology Contact information: [place], [place] [address] [phone]        [name], NP. Schedule an appointment as soon as possible for a visit in 1 week(s).   Specialty: Nurse Practitioner Contact information: [place] [address] [phone]              Allergies  Allergen Reactions  . Amlodipine Besylate Swelling    swelling     Procedures/Studies: CT ANGIO CHEST PE W OR WO CONTRAST  Result Date: 07/08/2020 CLINICAL DATA:  Positive D-dimer.  Shortness of breath. EXAM: CT ANGIOGRAPHY CHEST WITH CONTRAST TECHNIQUE: Multidetector CT imaging of the chest was performed using the standard protocol during bolus administration of intravenous contrast. Multiplanar CT image reconstructions and MIPs were obtained to evaluate the vascular anatomy. CONTRAST:  134mL OMNIPAQUE IOHEXOL 350 MG/ML SOLN COMPARISON:  Chest radiography yesterday.  Previous CT angiography 11/05/2019 FINDINGS: Cardiovascular: Heart size is normal. Coronary artery calcification is present. Aortic atherosclerotic calcification is present. Pulmonary arterial opacification is good. There are no pulmonary emboli. Mediastinum/Nodes: The esophagus is dilated and full of fluid, air and bowel contents. No mediastinal mass or lymphadenopathy. Lungs/Pleura: Mild emphysema at the lung apices. On the left, there is no pleural fluid. There is minimal atelectasis or scar at the left base. On the right, there is a very tiny amount of pleural fluid layering dependently. 5 mm nodule at the apex is unchanged. Chronic elevation of the right hemidiaphragm with volume loss and or pneumonia in the right lower lobe. This is more pronounced than was seen in 01-Dec-2019. Upper Abdomen: Small hiatal hernia. No acute upper abdominal finding. Musculoskeletal: Ordinary chronic thoracic degenerative changes. Review of the MIP images confirms the above findings. IMPRESSION: 1. No pulmonary emboli. 2. Chronic elevation of the right hemidiaphragm with volume loss and/or pneumonia in the right lower lobe. This is more pronounced than was seen in December 01, 2019. 3. Dilated esophagus with fluid, air and bowel contents. 4. Aortic atherosclerosis. Coronary artery calcification. Aortic Atherosclerosis (ICD10-I70.0) and Emphysema (ICD10-J43.9). Electronically Signed   By: Nelson Chimes M.D.   On: 07/08/2020 00:51   DG Chest Port 1 View  Result Date: 07/07/2020 CLINICAL DATA:  Shortness of breath. EXAM: PORTABLE CHEST 1 VIEW COMPARISON:  February 15, 2020 FINDINGS: Mild new opacity in the right base. The lungs are otherwise clear. The cardiomediastinal silhouette is stable and unremarkable. No pneumothorax. IMPRESSION: Mild right basilar opacity could represent atelectasis or developing infection. Recommend short-term follow-up imaging to ensure resolution. Electronically Signed   By: Dorise Bullion III M.D   On:  07/07/2020 19:09   ECHOCARDIOGRAM COMPLETE  Result Date: 07/08/2020    ECHOCARDIOGRAM REPORT   Patient Name:   Chloe Harding Date of Exam: 07/08/2020 Medical Rec #:  [id number]      Height:       60.0 in Accession #:    [id number]     Weight:       159.0 lb Date of Birth:  03-Jan-1946     BSA:          1.693 m Patient Age:    21 years       BP:           172/79 mmHg Patient Gender: F  HR:           79 bpm. Exam Location:  Inpatient Procedure: 2D Echo Indications:    CHF-Acute Systolic M76.72  History:        Patient has prior history of Echocardiogram examinations, most                 recent 11/06/2019. Risk Factors:Hypertension and Diabetes.  Sonographer:    Mikki Santee RDCS (AE) Referring Phys: [place]  1. Left ventricular ejection fraction, by estimation, is 60 to 65%. The left ventricle has normal function. The left ventricle has no regional wall motion abnormalities. Left ventricular diastolic parameters are consistent with Grade II diastolic dysfunction (pseudonormalization). Elevated left atrial pressure.  2. Right ventricular systolic function is normal. The right ventricular size is normal. There is moderately elevated pulmonary artery systolic pressure.  3. Left atrial size was severely dilated.  4. The mitral valve is abnormal. Moderate mitral valve regurgitation.  5. The aortic valve is tricuspid. Aortic valve regurgitation is trivial. Mild to moderate aortic valve sclerosis/calcification is present, without any evidence of aortic stenosis.  6. Pulmonic valve is difficult to see There is an echobright density on ventricular side of valve Cannot exclude mass/vegetation Would recomm limited TTE exam to evaluate further If inconclusive consider TEE.  7. The inferior vena cava is normal in size with <50% respiratory variability, suggesting right atrial pressure of 8 mmHg. FINDINGS  Left Ventricle: Left ventricular ejection fraction, by estimation, is 60 to 65%.  The left ventricle has normal function. The left ventricle has no regional wall motion abnormalities. The left ventricular internal cavity size was normal in size. There is  no left ventricular hypertrophy. Left ventricular diastolic parameters are consistent with Grade II diastolic dysfunction (pseudonormalization). Elevated left atrial pressure. Right Ventricle: The right ventricular size is normal. Right vetricular wall thickness was not assessed. Right ventricular systolic function is normal. There is moderately elevated pulmonary artery systolic pressure. The tricuspid regurgitant velocity is  3.29 m/s, and with an assumed right atrial pressure of 3 mmHg, the estimated right ventricular systolic pressure is 09.4 mmHg. Left Atrium: Left atrial size was severely dilated. Right Atrium: Right atrial size was normal in size. Pericardium: There is no evidence of pericardial effusion. Mitral Valve: The mitral valve is abnormal. There is mild thickening of the mitral valve leaflet(s). There is mild calcification of the mitral valve leaflet(s). Mild to moderate mitral annular calcification. Moderate mitral valve regurgitation. MV peak gradient, 18.3 mmHg. The mean mitral valve gradient is 8.0 mmHg. Tricuspid Valve: The tricuspid valve is grossly normal. Tricuspid valve regurgitation is mild. Aortic Valve: The aortic valve is tricuspid. Aortic valve regurgitation is trivial. Mild to moderate aortic valve sclerosis/calcification is present, without any evidence of aortic stenosis. Pulmonic Valve: The pulmonic valve was not well visualized. Pulmonic valve regurgitation is not visualized. Aorta: The aortic root is normal in size and structure. Pulmonary Artery: Pulmonic valve is difficult to see There is an echobright density on ventricular side of valve Cannot exclude mass/vegetation Would recomm limited TTE exam to evaluate further If inconclusive consider TEE. Venous: The inferior vena cava is normal in size with less  than 50% respiratory variability, suggesting right atrial pressure of 8 mmHg. IAS/Shunts: No atrial level shunt detected by color flow Doppler.  LEFT VENTRICLE PLAX 2D LVIDd:         5.10 cm  Diastology LVIDs:         3.30 cm  LV e'  medial:    4.73 cm/s LV PW:         1.00 cm  LV E/e' medial:  37.8 LV IVS:        1.10 cm  LV e' lateral:   5.10 cm/s LVOT diam:     2.00 cm  LV E/e' lateral: 35.1 LV SV:         69 LV SV Index:   41 LVOT Area:     3.14 cm  RIGHT VENTRICLE RV S prime:     10.30 cm/s TAPSE (M-mode): 1.8 cm LEFT ATRIUM              Index       RIGHT ATRIUM           Index LA diam:        4.60 cm  2.72 cm/m  RA Area:     15.00 cm LA Vol (A2C):   148.0 ml 87.42 ml/m RA Volume:   39.20 ml  23.15 ml/m LA Vol (A4C):   98.7 ml  58.30 ml/m LA Biplane Vol: 121.0 ml 71.47 ml/m  AORTIC VALVE LVOT Vmax:   101.00 cm/s LVOT Vmean:  68.100 cm/s LVOT VTI:    0.219 m  AORTA Ao Root diam: 2.70 cm MITRAL VALVE                TRICUSPID VALVE MV Area (PHT): 4.15 cm     TR Peak grad:   43.3 mmHg MV Area VTI:   1.25 cm     TR Vmax:        329.00 cm/s MV Peak grad:  18.3 mmHg MV Mean grad:  8.0 mmHg     SHUNTS MV Vmax:       2.14 m/s     Systemic VTI:  0.22 m MV Vmean:      132.0 cm/s   Systemic Diam: 2.00 cm MV Decel Time: 183 msec MV E velocity: 179.00 cm/s MV A velocity: 135.00 cm/s MV E/A ratio:  1.33 Dorris Carnes MD Electronically signed by Dorris Carnes MD Signature Date/Time: 07/08/2020/7:58:57 PM    Final    US THYROID  Result Date: 07/09/2020 CLINICAL DATA:  Nodule on previous CT. EXAM: THYROID ULTRASOUND TECHNIQUE: Ultrasound examination of the thyroid gland and adjacent soft tissues was performed. COMPARISON:  01/15/2019 FINDINGS: Parenchymal Echotexture: Moderately heterogenous Isthmus: 0.3 cm thickness, previously 0.2 Right lobe: 5.2 x 2.8 x 3 cm, previously 4.3 x 2.7 x 3 Left lobe: 4 x 1.1 x 1.2 cm, previously 4.5 x 1.3 x 1.1 _________________________________________________________ Estimated total number of  nodules >/= 1 cm: 2 Number of spongiform nodules >/=  2 cm not described below (TR1): 0 Number of mixed cystic and solid nodules >/= 1.5 cm not described below ([place]): 0 _________________________________________________________ Nodule # 1: Prior biopsy: No Location: Right ; Superior Maximum size: 3.3 cm; Other 2 dimensions: 2.5 x 2.6 cm, previously, 2.6 x 2.3 x 2.5 cm Composition: solid/almost completely solid (2) Echogenicity: hypoechoic (2) Shape: not taller-than-wide (0) Margins: smooth (0) Echogenic foci: none (0) ACR TI-RADS total points: 4. ACR TI-RADS risk category:  TR4 (4-6 points). Significant change in size (>/= 20% in two dimensions and minimal increase of 2 mm): No Change in features: No Change in ACR TI-RADS risk category: No ACR TI-RADS recommendations: **Given size (>/= 1.5 cm) and appearance, fine needle aspiration of this moderately suspicious nodule should be considered based on TI-RADS criteria. _________________________________________________________ Nodule # 2: Prior biopsy: No Location: Right; Superior posterior  Maximum size: 1.6 cm; Other 2 dimensions: 1 x 1.4 cm, previously, 1.6 x 1.3 x 1.4 cm Composition: solid/almost completely solid (2) Echogenicity: hypoechoic (2) Shape: not taller-than-wide (0) Margins: smooth (0) Echogenic foci: none (0) ACR TI-RADS total points: 4. ACR TI-RADS risk category:  TR4 (4-6 points). Significant change in size (>/= 20% in two dimensions and minimal increase of 2 mm): No Change in features: No Change in ACR TI-RADS risk category: No ACR TI-RADS recommendations: **Given size (>/= 1.5 cm) and appearance, fine needle aspiration of this moderately suspicious nodule should be considered based on TI-RADS criteria. _________________________________________________________ Nodule # 3: 0.4 cm benign colloid cyst, mid left. IMPRESSION: Mild thyromegaly with stable right nodules. Both meet criteria for FNA biopsy, if not already performed. The above is in keeping with  the ACR TI-RADS recommendations - J Am Coll Radiol 2017;14:587-595. Electronically Signed   By: Lucrezia Europe M.D.   On: 07/09/2020 15:27   VAS US RENAL ARTERY DUPLEX  Result Date: 07/09/2020 ABDOMINAL VISCERAL Indications: Flash pulmonary edema High Risk Factors: Hypertension. Limitations: Air/bowel gas and obesity. Comparison Study: No prior studies. Performing Technologist: Oliver Hum RVT  Examination Guidelines: A complete evaluation includes B-mode imaging, spectral Doppler, color Doppler, and power Doppler as needed of all accessible portions of each vessel. Bilateral testing is considered an integral part of a complete examination. Limited examinations for reoccurring indications may be performed as noted.  Duplex Findings: +--------------------+--------+--------+------+-------------------+ Mesenteric          PSV cm/sEDV cm/sPlaque     Comments       +--------------------+--------+--------+------+-------------------+ Aorta Distal          178                                     +--------------------+--------+--------+------+-------------------+ Celiac Artery Origin                      Unable to visualize +--------------------+--------+--------+------+-------------------+ SMA Origin                                Unable to visualize +--------------------+--------+--------+------+-------------------+    +------------------+--------+--------+-------+ Right Renal ArteryPSV cm/sEDV cm/sComment +------------------+--------+--------+-------+ Origin              130      21           +------------------+--------+--------+-------+ Proximal            254      24           +------------------+--------+--------+-------+ Mid                  90      18           +------------------+--------+--------+-------+ Distal               65      13           +------------------+--------+--------+-------+ +-----------------+--------+--------+-------+ Left Renal ArteryPSV  cm/sEDV cm/sComment +-----------------+--------+--------+-------+ Origin             183      14           +-----------------+--------+--------+-------+ Proximal            85      10           +-----------------+--------+--------+-------+ Mid  135      17           +-----------------+--------+--------+-------+ Distal              31      6            +-----------------+--------+--------+-------+ +------------+--------+--------+----+-----------+--------+--------+----+ Right KidneyPSV cm/sEDV cm/sRI  Left KidneyPSV cm/sEDV cm/sRI   +------------+--------+--------+----+-----------+--------+--------+----+ Upper Pole  24      6       0.75Upper Pole 27      6       0.78 +------------+--------+--------+----+-----------+--------+--------+----+ Mid         18      6       0.68Mid        20      5       0.76 +------------+--------+--------+----+-----------+--------+--------+----+ Lower Pole  19      6       0.69Lower Pole 27      6       0.77 +------------+--------+--------+----+-----------+--------+--------+----+ Hilar       38      8       0.79Hilar      79      14      0.82 +------------+--------+--------+----+-----------+--------+--------+----+ +------------------+----+------------------+-----+ Right Kidney          Left Kidney             +------------------+----+------------------+-----+ RAR                   RAR                     +------------------+----+------------------+-----+ RAR (manual)      1.43RAR (manual)      1.03  +------------------+----+------------------+-----+ Cortex                Cortex                  +------------------+----+------------------+-----+ Cortex thickness      Corex thickness         +------------------+----+------------------+-----+ Kidney length (cm)9.00Kidney length (cm)10.50 +------------------+----+------------------+-----+  Summary: Renal:  Right: Normal size right  kidney. 1-59% stenosis of the right renal        artery. Normal right Resisitive Index. Left:  Normal size of left kidney. 1-59% stenosis of the left renal        artery. Normal left Resistive Index.  *See table(s) above for measurements and observations.  Diagnosing physician: Deitra Mayo MD  Electronically signed by Deitra Mayo MD on 07/09/2020 at 6:30:17 PM.    Final    ECHOCARDIOGRAM LIMITED  Result Date: 07/09/2020    ECHOCARDIOGRAM LIMITED REPORT   Patient Name:   Chloe Harding Date of Exam: 07/09/2020 Medical Rec #:  [id number]      Height:       60.0 in Accession #:    [id number]     Weight:       156.0 lb Date of Birth:  [birth year]     BSA:          1.680 m Patient Age:    54 years       BP:           163/81 mmHg Patient Gender: F              HR:           96 bpm. 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 CHF; Risk Factors:Hypertension, Dyslipidemia,                 Diabetes and Non-Smoker.  Sonographer:    Vickie Epley RDCS Referring Phys: [id number] PATRICK HUNG  Conclusion(s)/Recommendation(s): Limited study to assess pulmonic valve. There appears to be a small area of the valve that is likely calcified (see image 6), and there is a small area in the RVOT that appears to be a filamentous structure. Compared to prior images from yesterday, the mass like/echobright region previously seen is not well appreciated. This area was not commented on in recent CTPE as well. Could consider evaluation with TEE, though RVOT is most distal structure to TEE probe and difficult to fully evaluate. If there is significant clinical concern, could also consider cardiac MRI. Otherwise would recommend follow up TTE as an outpatient. Buford Dresser MD Electronically signed by Buford Dresser MD Signature Date/Time: 07/09/2020/8:22:55 PM     Final        Discharge Exam: Vitals:   07/10/20 1200 07/10/20 1300  BP: (!) 156/77 (!) 193/78  Pulse: 61 74  Resp: 18 (!) 21  Temp: 98.1 F (36.7 C)   SpO2: 96% 100%    General: Pt is alert, awake, not in acute distress Cardiovascular: RRR, S1/S2 +, no edema Respiratory: CTA bilaterally anteriorly, no wheezing, no rhonchi, no respiratory distress, no conversational dyspnea, on room air  Abdominal: Soft, NT, ND, bowel sounds + Extremities: no edema, no cyanosis Psych: Normal mood and affect, stable judgement and insight     The results of significant diagnostics from this hospitalization (including imaging, microbiology, ancillary and laboratory) are listed below for reference.     Microbiology: Recent Results (from the past 240 hour(s))  Resp Panel by RT-PCR (Flu A&B, Covid) Nasopharyngeal Swab     Status: None   Collection Time: 07/07/20  6:26 PM   Specimen: Nasopharyngeal Swab; Nasopharyngeal(NP) swabs in vial transport medium  Result Value Ref Range Status   SARS Coronavirus 2 by RT PCR NEGATIVE NEGATIVE Final    Comment: (NOTE) SARS-CoV-2 target nucleic acids are NOT DETECTED.  The SARS-CoV-2 RNA is generally detectable in upper respiratory specimens during the acute phase of infection. The lowest concentration of SARS-CoV-2 viral copies this assay can detect is 138 copies/mL. A negative result does not preclude SARS-Cov-2 infection and should not be used as the sole basis for treatment or other patient management decisions. A negative result may occur with  improper specimen collection/handling, submission of specimen other than nasopharyngeal swab, presence of viral mutation(s) within the areas targeted by this assay, and inadequate number of viral copies(<138 copies/mL). A negative result must be combined with clinical observations, patient history, and epidemiological information. The expected result is Negative.  Fact Sheet for Patients:   EntrepreneurPulse.com.au  Fact Sheet for Healthcare Providers:  IncredibleEmployment.be  This test is no t yet approved or cleared by the [place] FDA and  has been authorized for detection and/or diagnosis of SARS-CoV-2 by FDA under an Emergency Use Authorization (EUA). This EUA will remain  in effect (meaning this test can be used) for the duration of the COVID-19 declaration under Section 564(b)(1) of the Act, 21 U.S.C.section 360bbb-3(b)(1), unless the authorization is terminated  or revoked sooner.       Influenza A by PCR  NEGATIVE NEGATIVE Final   Influenza B by PCR NEGATIVE NEGATIVE Final    Comment: (NOTE) The Xpert Xpress SARS-CoV-2/FLU/RSV plus assay is intended as an aid in the diagnosis of influenza from Nasopharyngeal swab specimens and should not be used as a sole basis for treatment. Nasal washings and aspirates are unacceptable for Xpert Xpress SARS-CoV-2/FLU/RSV testing.  Fact Sheet for Patients: EntrepreneurPulse.com.au  Fact Sheet for Healthcare Providers: IncredibleEmployment.be  This test is not yet approved or cleared by the [place] FDA and has been authorized for detection and/or diagnosis of SARS-CoV-2 by FDA under an Emergency Use Authorization (EUA). This EUA will remain in effect (meaning this test can be used) for the duration of the COVID-19 declaration under Section 564(b)(1) of the Act, 21 U.S.C. section 360bbb-3(b)(1), unless the authorization is terminated or revoked.  Performed at [facility], [place] [address] [place], [place] [address]   Culture, blood (routine x 2)     Status: None (Preliminary result)   Collection Time: 07/07/20  8:20 PM   Specimen: BLOOD RIGHT FOREARM  Result Value Ref Range Status   Specimen Description   Final    BLOOD RIGHT FOREARM Performed at [facility], [place] [address] [place], [place] [address]     Special Requests   Final    BOTTLES DRAWN AEROBIC AND ANAEROBIC Blood Culture adequate volume Performed at [place] [address], [place], [place] [address]    Culture   Final    NO GROWTH 2 DAYS Performed at [facility] [address] [place], [place] [address]    Report Status PENDING  Incomplete  Culture, blood (routine x 2)     Status: None (Preliminary result)   Collection Time: 07/07/20  8:40 PM   Specimen: BLOOD  Result Value Ref Range Status   Specimen Description   Final    BLOOD RIGHT ANTECUBITAL Performed at [place] [address], [place], [place] [address]    Special Requests   Final    BOTTLES DRAWN AEROBIC AND ANAEROBIC Blood Culture adequate volume Performed at [place] [address] [place], [place] [address]    Culture   Final    NO GROWTH 2 DAYS Performed at [place] [address], [place], [place] [address]    Report Status PENDING  Incomplete  MRSA PCR Screening     Status: Abnormal   Collection Time: 07/08/20 12:52 AM   Specimen: Nasal Mucosa; Nasopharyngeal  Result Value Ref Range Status   MRSA by PCR POSITIVE (A) NEGATIVE Final    Comment:        The GeneXpert MRSA Assay (FDA approved for NASAL specimens only), is one component of a comprehensive MRSA colonization surveillance program. It is not intended to diagnose MRSA infection nor to guide or monitor treatment for MRSA infections. RESULT CALLED TO, READ BACK BY AND VERIFIED WITH: RN PENG 07/08/20 @0301  BY P.HENDERSON Performed at [facility], [place] [address], [place], [place] [address]      Labs: BNP (last 3 results) Recent Labs    11/09/19 0337 01/18/20 2032 07/07/20 1930  BNP 2,255.4* 439.5* 915.0*   Basic Metabolic Panel: Recent Labs  Lab 07/07/20 1930 07/07/20 1946 07/08/20 0215 07/09/20 0248 07/10/20 0234  NA 137 138 136 141 138  K 4.1 4.1 4.1 3.6 3.9   CL 102 102 102 102 99  CO2 24  --  23 26 26  GLUCOSE 180* 181* 209* 116* 89  BUN 10 8 11 18 19   CREATININE 0.64 0.60 0.71 0.96 1.11*  CALCIUM 9.0  --  9.3 10.0 9.4  MG  --   --  1.6* 2.4 1.9   Liver Function Tests: Recent Labs  Lab 07/07/20 1930 07/09/20 0248  AST 17 19  ALT 8 9  ALKPHOS 92 73  BILITOT 0.7 1.2  PROT 7.4 6.9  ALBUMIN 3.8 3.8   No results for input(s): LIPASE, AMYLASE in the last 168 hours. No results for input(s): AMMONIA in the last 168 hours. CBC: Recent Labs  Lab 07/07/20 1930 07/07/20 1946 07/08/20 0215 07/09/20 0248 07/10/20 0234  WBC 7.2  --  3.7* 6.7 6.2  NEUTROABS 4.1  --   --  4.0 2.2  HGB 10.8* 11.6* 10.6* 9.8* 10.4*  HCT 34.3* 34.0* 32.9* 30.1* 32.8*  MCV 87.7  --  86.6 86.5 87.5  PLT 247  --  230 243 248   Cardiac Enzymes: No results for input(s): CKTOTAL, CKMB, CKMBINDEX, TROPONINI in the last 168 hours. BNP: Invalid input(s): POCBNP CBG: Recent Labs  Lab 07/09/20 0547 07/09/20 0752 07/09/20 1205 07/09/20 1549 07/09/20 1932  GLUCAP 97 87 92 136* 171*   D-Dimer Recent Labs    07/07/20 2037  DDIMER 1.11*   Hgb A1c Recent Labs    07/08/20 0215  HGBA1C 5.7*   Lipid Profile No results for input(s): CHOL, HDL, LDLCALC, TRIG, CHOLHDL, LDLDIRECT in the last 72 hours. Thyroid function studies Recent Labs    07/09/20 0248  TSH 1.019   Anemia work up No results for input(s): VITAMINB12, FOLATE, FERRITIN, TIBC, IRON, RETICCTPCT in the last 72 hours. Urinalysis    Component Value Date/Time   COLORURINE YELLOW 02/15/2020 1325   APPEARANCEUR Clear 02/23/2020 1104   LABSPEC 1.015 02/15/2020 1325   PHURINE 5.0 02/15/2020 1325   GLUCOSEU Negative 02/23/2020 1104   HGBUR SMALL (A) 02/15/2020 1325   HGBUR trace-intact 05/14/2010 1402   BILIRUBINUR Negative 02/23/2020 1104   KETONESUR 5 (A) 02/15/2020 1325   PROTEINUR Trace 02/23/2020 1104   PROTEINUR 30 (A) 02/15/2020 1325   UROBILINOGEN 1.0 03/30/2015 1540   NITRITE  Negative 02/23/2020 1104   NITRITE NEGATIVE 02/15/2020 1325   LEUKOCYTESUR Trace (A) 02/23/2020 1104   LEUKOCYTESUR LARGE (A) 02/15/2020 1325   Sepsis Labs Invalid input(s): PROCALCITONIN,  WBC,  LACTICIDVEN Microbiology Recent Results (from the past 240 hour(s))  Resp Panel by RT-PCR (Flu A&B, Covid) Nasopharyngeal Swab     Status: None   Collection Time: 07/07/20  6:26 PM   Specimen: Nasopharyngeal Swab; Nasopharyngeal(NP) swabs in vial transport medium  Result Value Ref Range Status   SARS Coronavirus 2 by RT PCR NEGATIVE NEGATIVE Final    Comment: (NOTE) SARS-CoV-2 target nucleic acids are NOT DETECTED.  The SARS-CoV-2 RNA is generally detectable in upper respiratory specimens during the acute phase of infection. The lowest concentration of SARS-CoV-2 viral copies this assay can detect is 138 copies/mL. A negative result does not preclude SARS-Cov-2 infection and should not be used as the sole basis for treatment or other patient management decisions. A negative result may occur with  improper specimen collection/handling, submission of specimen other than nasopharyngeal swab, presence of viral mutation(s) within the areas targeted by this assay, and inadequate number of viral copies(<138 copies/mL). A negative result must be combined with clinical observations, patient history, and epidemiological information. The expected result is Negative.  Fact Sheet for Patients:  EntrepreneurPulse.com.au  Fact Sheet  for Healthcare Providers:  IncredibleEmployment.be  This test is no t yet approved or cleared by the [place] and  has been authorized for detection and/or diagnosis of SARS-CoV-2 by FDA under an Emergency Use Authorization (EUA). This EUA will remain  in effect (meaning this test can be used) for the duration of the COVID-19 declaration under Section 564(b)(1) of the Act, 21 U.S.C.section 360bbb-3(b)(1), unless the  authorization is terminated  or revoked sooner.       Influenza A by PCR NEGATIVE NEGATIVE Final   Influenza B by PCR NEGATIVE NEGATIVE Final    Comment: (NOTE) The Xpert Xpress SARS-CoV-2/FLU/RSV plus assay is intended as an aid in the diagnosis of influenza from Nasopharyngeal swab specimens and should not be used as a sole basis for treatment. Nasal washings and aspirates are unacceptable for Xpert Xpress SARS-CoV-2/FLU/RSV testing.  Fact Sheet for Patients: EntrepreneurPulse.com.au  Fact Sheet for Healthcare Providers: IncredibleEmployment.be  This test is not yet approved or cleared by the [place] FDA and has been authorized for detection and/or diagnosis of SARS-CoV-2 by FDA under an Emergency Use Authorization (EUA). This EUA will remain in effect (meaning this test can be used) for the duration of the COVID-19 declaration under Section 564(b)(1) of the Act, 21 U.S.C. section 360bbb-3(b)(1), unless the authorization is terminated or revoked.  Performed at [facility], [place] [address], [place], [place] [address]   Culture, blood (routine x 2)     Status: None (Preliminary result)   Collection Time: 07/07/20  8:20 PM   Specimen: BLOOD RIGHT FOREARM  Result Value Ref Range Status   Specimen Description   Final    BLOOD RIGHT FOREARM Performed at [facility], [place] [address], [place], [place] [address]    Special Requests   Final    BOTTLES DRAWN AEROBIC AND ANAEROBIC Blood Culture adequate volume Performed at [place] [address] [place], [place] [address]    Culture   Final    NO GROWTH 2 DAYS Performed at [facility] [address] [place], [place] [address]    Report Status PENDING  Incomplete  Culture, blood (routine x 2)     Status: None (Preliminary result)   Collection Time: 07/07/20  8:40 PM   Specimen: BLOOD  Result Value Ref Range Status   Specimen  Description   Final    BLOOD RIGHT ANTECUBITAL Performed at [place] [address], [place], [place] [address]    Special Requests   Final    BOTTLES DRAWN AEROBIC AND ANAEROBIC Blood Culture adequate volume Performed at [facility] [address] [place], [place] [address]    Culture   Final    NO GROWTH 2 DAYS Performed at [facility] [address] [place],  [address]    Report Status PENDING  Incomplete  MRSA PCR Screening     Status: Abnormal   Collection Time: 07/08/20 12:52 AM   Specimen: Nasal Mucosa; Nasopharyngeal  Result Value Ref Range Status   MRSA by PCR POSITIVE (A) NEGATIVE Final    Comment:        The GeneXpert MRSA Assay (FDA approved for NASAL specimens only), is one component of a comprehensive MRSA colonization surveillance program. It is not intended to diagnose MRSA infection nor to guide or monitor treatment for MRSA infections. RESULT CALLED TO, READ BACK BY AND VERIFIED WITH: RN PENG 07/08/20 @0301  BY P.HENDERSON Performed  at [facility], [place] [address] [place], [place] [address]      Patient was seen and examined on the day of discharge and was found to be in stable condition. Time coordinating discharge: 35 minutes including assessment and coordination of care, as well as examination of the patient.   SIGNED:  Dessa Phi, DO [facility] 07/10/2020, 2:42 PM

## 2020-07-13 LAB — CULTURE, BLOOD (ROUTINE X 2)
Culture: NO GROWTH
Culture: NO GROWTH
Special Requests: ADEQUATE
Special Requests: ADEQUATE

## 2020-07-31 NOTE — Progress Notes (Deleted)
Cardiology Clinic Note   Patient Name: Chloe Harding Date of Encounter: 07/31/2020  Primary Care Provider:  [name], NP Primary Cardiologist:  Chloe Latch, MD  Patient Profile    Chloe Harding 75 year old female presents the clinic today for follow-up evaluation of her CHF.  Past Medical History    Past Medical History:  Diagnosis Date  . Aortic atherosclerosis ([place])   . Arthritis   . Back pain 02/23/2020  . Chronic combined systolic and diastolic CHF (congestive heart failure) ([place])   . Coronary atherosclerosis   . Depression   . Diabetes mellitus   . Esophageal stricture   . Fall   . Hiatal hernia   . Hypercholesteremia   . Hypertension   . Obesity   . Pneumonia   . Reflux   . Renal disorder    shutting down 4 years ago  . Subdural hematoma ([place])   . Thyroid nodule   . Urinary retention   . UTI due to Klebsiella species    Past Surgical History:  Procedure Laterality Date  . BALLOON DILATION N/A 01/25/2020   Procedure: BALLOON DILATION;  Surgeon: Chloe Ada, MD;  Location: [facility];  Service: Endoscopy;  Laterality: N/A;  . CHOLECYSTECTOMY    . COLONOSCOPY WITH PROPOFOL N/A 05/20/2017   Procedure: COLONOSCOPY WITH PROPOFOL;  Surgeon: Chloe Ada, MD;  Location: [facility];  Service: Endoscopy;  Laterality: N/A;  . ESOPHAGOGASTRODUODENOSCOPY N/A 05/19/2017   Procedure: ESOPHAGOGASTRODUODENOSCOPY (EGD);  Surgeon: Chloe Craver, MD;  Location: [facility];  Service: Endoscopy;  Laterality: N/A;  . ESOPHAGOGASTRODUODENOSCOPY (EGD) WITH PROPOFOL N/A 01/17/2019   Procedure: ESOPHAGOGASTRODUODENOSCOPY (EGD) WITH PROPOFOL;  Surgeon: Chloe Ada, MD;  Location: [facility] ENDOSCOPY;  Service: Endoscopy;  Laterality: N/A;  . ESOPHAGOGASTRODUODENOSCOPY (EGD) WITH PROPOFOL N/A 01/25/2020   Procedure: ESOPHAGOGASTRODUODENOSCOPY (EGD) WITH PROPOFOL;  Surgeon: Chloe Ada, MD;  Location: [facility] ENDOSCOPY;  Service: Endoscopy;  Laterality: N/A;  .  ESOPHAGOGASTRODUODENOSCOPY (EGD) WITH PROPOFOL N/A 07/09/2020   Procedure: ESOPHAGOGASTRODUODENOSCOPY (EGD) WITH PROPOFOL;  Surgeon: Chloe Ada, MD;  Location: [facility] ENDOSCOPY;  Service: Endoscopy;  Laterality: N/A;  . SAVORY DILATION N/A 01/17/2019   Procedure: SAVORY DILATION;  Surgeon: Chloe Ada, MD;  Location: [facility] ENDOSCOPY;  Service: Endoscopy;  Laterality: N/A;    Allergies  Allergies  Allergen Reactions  . Amlodipine Besylate Swelling    swelling    History of Present Illness    [name] has a past medical history of benign essential hypertension, hypotension, acute CHF, demand ischemia, CAD, community-acquired pneumonia, GERD, type 2 diabetes, stage I chronic kidney disease, dyslipidemia, hyperkalemia, shortness of breath, and depression.  She was admitted to the hospital on 11/05/2019 through 11/09/2019.  She presented to the emergency department with shortness of breath and cough.  Her CTA was negative for PE but concerning for pneumonia, atelectasis and fluid overload.  She had completed a course of amoxicillin.  Upon admission she was started on Rocephin and IV diuresis.  She received 1 unit of PRBC, her GI work-up was negative.  She did well with bronchodilators, IV ABX, and diuretics.  Her breathing improved slowly over the course of 3 days.  She was also diagnosed with iron deficiency anemia and prescribed iron supplement.  Cardiology was consulted due to elevated troponins.  This was felt to be related to demand ischemia.  Her echocardiogram showed an LVEF of 45-50% with global hypokinesis.  EKG showed no evidence of ischemia.  Due to her lack of ischemic symptoms and anemia cardiac catheterization  was not planned.  Recommend maintaining hemoglobin greater than 8.  She presented to the clinic 11/22/2019 for follow-up evaluation and stated she was feeling much better.  She had been walking 30 to 45 minutes/day.  She stated that she was trying to eat better and used to be 100 pounds  heavier.  She stated that she ate a lot of junk food and needed to cut out snacks from her diet.  She had not yet been to GI for follow-up evaluation for chronic anemia.  However, she did state that she  had several evaluations in the form of colonoscopy and EGD with no success in finding GI bleeding in the past.  I will gave her the salty 6 diet she, ask her to increase her physical activity as tolerated, and planned follow-up with Chloe Harding in 3 months.  She followed up with Chloe Harding 02/23/2020.  She had been admitted to the hospital on 9/21 with altered mental status, acute kidney failure, and a fall.  She was found to have a UTI.  It was treated with levofloxacin and her Foley catheter was exchanged.  She also presented to the hospital later that month with encephalopathy and acute renal failure.  It was felt to be due to severe sepsis from another catheter associated UTI.  She was treated with IV antibiotics.  She reported struggling with back pain.  Daughter had moved back on when she was doing PT.  She reported left flank pain during her appointment.  She described the sensation as burning.  She denied fever and chills.  She denied falls or injury.  She denied chest pain shortness of breath lower extremity swelling.  She had an appointment with urology 02/29/2020 for evaluation of removal of her Foley catheter.  She was admitted to the hospital on 07/08/2020 and was discharged on 07/10/2020.  She reported she had developed shortness of breath, DOE, orthopnea and productive cough for 2 weeks.  She was admitted with hypoxia/hypertension and treated with inhaler, IV steroids, IV NTG, antibiotics, and furosemide.  She showed rapid improvement.  CTA was negative for PE but did show chronic elevation of the right hemidiaphragm, PNA and right lower lobe.  Due to her esophageal issues GI was consulted and found a benign esophageal stenosis, reflux esophagitis, and 7 cm hiatal hernia.  Echocardiogram 07/08/2020  showed an EF of 60-65%, G1 DD, and moderate MR. There was also a area in the RVOT that appeared to be a filamentous structure.  Limited TTE was performed on 07/09/2020 and area and RVOT was not well visualized.  Cardiac MRI was recommended.  She presents the clinic today for follow-up evaluation states***  Today she denies chest pain, shortness of breath, lower extremity edema, fatigue, palpitations, melena, hematuria, hemoptysis, diaphoresis, weakness, presyncope, syncope, orthopnea, and PND.   Home Medications    Prior to Admission medications   Medication Sig Start Date End Date Taking? Authorizing Provider  albuterol (VENTOLIN HFA) 108 (90 Base) MCG/ACT inhaler Inhale 1-2 puffs into the lungs every 4 (four) hours as needed for wheezing or shortness of breath. 06/28/20   [provider]  atorvastatin (LIPITOR) 20 MG tablet Take 1 tablet (20 mg total) by mouth daily. 02/18/20   Mercy Riding, MD  escitalopram (LEXAPRO) 20 MG tablet Take 20 mg by mouth daily. 06/21/20   [provider]  folic acid (FOLVITE) 1 MG tablet Take 1 tablet (1 mg total) by mouth daily. 02/14/20   Medina-Vargas, Senaida Lange, NP  furosemide (LASIX) 40 MG tablet Take 1 tablet (40 mg total) by mouth daily. 07/10/20   Dessa Phi, DO  guaifenesin (TUSSIN) 100 MG/5ML syrup Take 200 mg by mouth 3 (three) times daily as needed for cough.    [provider]  HYDROcodone-acetaminophen (NORCO/VICODIN) 5-325 MG tablet Take 1 tablet by mouth 4 (four) times daily as needed for moderate pain. 06/21/20   [provider]  hydroxychloroquine (PLAQUENIL) 200 MG tablet Take 1 tablet (200 mg total) by mouth daily. Patient not taking: Reported on 07/08/2020 02/18/20   Mercy Riding, MD  lactulose (CHRONULAC) 10 GM/15ML solution Take 10-20 g by mouth daily as needed for mild constipation. 07/06/20   [provider]  melatonin 5 MG TABS Take 10 mg by mouth at bedtime.    [provider]  metoprolol  succinate (TOPROL-XL) 25 MG 24 hr tablet Take 25 mg by mouth daily. 07/04/20   [provider]  pantoprazole (PROTONIX) 40 MG tablet Take 1 tablet (40 mg total) by mouth 2 (two) times daily. 02/18/20   Mercy Riding, MD  potassium chloride (KLOR-CON) 10 MEQ tablet Take 10 mEq by mouth daily. 06/21/20   [provider]  rOPINIRole (REQUIP) 0.5 MG tablet Take 0.5 mg by mouth at bedtime. 06/21/20   [provider]    Family History    Family History  Problem Relation Age of Onset  . Heart attack Sister   . Heart failure Sister   . Heart attack Brother   . Heart failure Brother    She indicated that her mother is deceased. She indicated that her father is deceased. She indicated that her sister is deceased. She indicated that her brother is deceased.  Social History    Social History   Socioeconomic History  . Marital status: Married    Spouse name: Grayce Budden  . Number of children: Not on file  . Years of education: Not on file  . Highest education level: Not on file  Occupational History  . Not on file  Tobacco Use  . Smoking status: Never Smoker  . Smokeless tobacco: Never Used  Vaping Use  . Vaping Use: Never used  Substance and Sexual Activity  . Alcohol use: No  . Drug use: No  . Sexual activity: Not Currently  Other Topics Concern  . Not on file  Social History Narrative  . Not on file   Social Determinants of Health   Financial Resource Strain: Not on file  Food Insecurity: Not on file  Transportation Needs: Not on file  Physical Activity: Not on file  Stress: Not on file  Social Connections: Not on file  Intimate Partner Violence: Not on file     Review of Systems    General:  No chills, fever, night sweats or weight changes.  Cardiovascular:  No chest pain, dyspnea on exertion, edema, orthopnea, palpitations, paroxysmal nocturnal dyspnea. Dermatological: No rash, lesions/masses Respiratory: No cough, dyspnea Urologic: No  hematuria, dysuria Abdominal:   No nausea, vomiting, diarrhea, bright red blood per rectum, melena, or hematemesis Neurologic:  No visual changes, wkns, changes in mental status. All other systems reviewed and are otherwise negative except as noted above.  Physical Exam    VS:  There were no vitals taken for this visit. , BMI There is no height or weight on file to calculate BMI. GEN: Well nourished, well developed, in no acute distress. HEENT: normal. Neck: Supple, no JVD, carotid bruits, or masses. Cardiac: RRR, no  murmurs, rubs, or gallops. No clubbing, cyanosis, edema.  Radials/DP/PT 2+ and equal bilaterally.  Respiratory:  Respirations regular and unlabored, clear to auscultation bilaterally. GI: Soft, nontender, nondistended, BS + x 4. MS: no deformity or atrophy. Skin: warm and dry, no rash. Neuro:  Strength and sensation are intact. Psych: Normal affect.  Accessory Clinical Findings    Recent Labs: 07/07/2020: B Natriuretic Peptide 381.5 07/09/2020: ALT 9; TSH 1.019 07/10/2020: BUN 19; Creatinine, Ser 1.11; Hemoglobin 10.4; Magnesium 1.9; Platelets 248; Potassium 3.9; Sodium 138   Recent Lipid Panel    Component Value Date/Time   CHOL 189 03/25/2010 2105   TRIG 159 (H) 03/25/2010 2105   HDL 39 (L) 03/25/2010 2105   CHOLHDL 4.8 Ratio 03/25/2010 2105   VLDL 32 03/25/2010 2105   LDLCALC 118 (H) 03/25/2010 2105   LDLDIRECT 44 11/22/2019 1130    ECG personally reviewed by me today- *** - No acute changes  EKG 11/05/2019 Sinus tachycardia 107 bpm  Echocardiogram 11/06/2019  1. Mild global reduction in LV systolic function; grade 1 diastolic  dysfunction; mild MR; mild LAE.  2. Left ventricular ejection fraction, by estimation, is 45 to 50%. The  left ventricle has mildly decreased function. The left ventricle  demonstrates global hypokinesis. Left ventricular diastolic parameters are  consistent with Grade I diastolic  dysfunction (impaired relaxation). Elevated  left atrial pressure.  3. Right ventricular systolic function is normal. The right ventricular  size is normal. There is mildly elevated pulmonary artery systolic  pressure.  4. Left atrial size was mildly dilated.  5. The mitral valve is normal in structure. Mild mitral valve  regurgitation. No evidence of mitral stenosis.  6. The aortic valve is tricuspid. Aortic valve regurgitation is not  visualized. Mild to moderate aortic valve sclerosis/calcification is  present, without any evidence of aortic stenosis.  7. The inferior vena cava is normal in size with greater than 50%  respiratory variability, suggesting right atrial pressure of 3 mmHg.  Echocardiogram 07/08/2020 IMPRESSIONS    1. Left ventricular ejection fraction, by estimation, is 60 to 65%. The  left ventricle has normal function. The left ventricle has no regional  wall motion abnormalities. Left ventricular diastolic parameters are  consistent with Grade II diastolic  dysfunction (pseudonormalization). Elevated left atrial pressure.  2. Right ventricular systolic function is normal. The right ventricular  size is normal. There is moderately elevated pulmonary artery systolic  pressure.  3. Left atrial size was severely dilated.  4. The mitral valve is abnormal. Moderate mitral valve regurgitation.  5. The aortic valve is tricuspid. Aortic valve regurgitation is trivial.  Mild to moderate aortic valve sclerosis/calcification is present, without  any evidence of aortic stenosis.  6. Pulmonic valve is difficult to see There is an echobright density on  ventricular side of valve Cannot exclude mass/vegetation Would recomm  limited TTE exam to evaluate further If inconclusive consider TEE.  7. The inferior vena cava is normal in size with <50% respiratory  variability, suggesting right atrial pressure of 8 mmHg.  Echocardiogram limited 07/09/2020  Conclusion(s)/Recommendation(s): Limited study to assess  pulmonic valve.  There appears to be a small area of the valve that is likely calcified  (see image 6), and there is a small area in the RVOT that appears to be a  filamentous structure. Compared to  prior images from yesterday, the mass like/echobright region previously  seen is not well appreciated. This area was not commented on in recent  CTPE as well. Could  consider evaluation with TEE, though RVOT is most  distal structure to TEE probe and  difficult to fully evaluate. If there is significant clinical concern,  could also consider cardiac MRI. Otherwise would recommend follow up TTE  as an outpatient.   Assessment & Plan   1. Acute systolic and diastolic CHF-euvolemic today.  No increased work of breathing.  Echocardiogram showed LVEF of 45-50% with global hypokinesis, G1 DD, no significant valvular abnormalities. Continue furosemide, metoprolol Daily weights Heart healthy low-sodium diet-salty 6 given Increase physical activity as tolerated  Coronary artery calcification-visualized on CT.  Not a candidate for antiplatelet agents due to history of subdural hematoma, poor candidate for invasive ischemic evaluation.  On admission troponin is negative. Continue atorvastatin, metoprolol Heart healthy low-sodium diet  Right ventricle mass-filamentous structure noted on consecutive transthoracic echocardiograms and CT.  Felt not to be a good candidate for TEE due to esophageal stricture and location of structure. Order cardiac MRI  Hyperlipidemia-LDL 118 on 03/25/2010 Continue atorvastatin Repeat lipid panel  Acute on chronic anemia-received 1 unit of PRBCs and was started on iron supplementation.  Recommended stress test versus cardiac catheterization once her source of the anemia has been determined. Follow-up with GI  Disposition: Follow-up with Chloe Harding in 3 months.   Jossie Ng. Cleaver NP-C    07/31/2020, 6:39 AM [address] [place]  [address] Office [phone] Fax [phone]  Notice: This dictation was prepared with Dragon dictation along with smaller phrase technology. Any transcriptional errors that result from this process are unintentional and may not be corrected upon review.  I spent***minutes examining this patient, reviewing medications, and using patient centered shared decision making involving her cardiac care.  Prior to her visit I spent greater than 20 minutes reviewing her past medical history,  medications, and prior cardiac tests.

## 2020-08-01 ENCOUNTER — Ambulatory Visit: Payer: Medicare HMO | Admitting: General Practice

## 2020-09-01 ENCOUNTER — Emergency Department (HOSPITAL_COMMUNITY): Payer: Medicare HMO

## 2020-09-01 ENCOUNTER — Other Ambulatory Visit: Payer: Self-pay

## 2020-09-01 ENCOUNTER — Observation Stay (HOSPITAL_COMMUNITY)
Admission: EM | Admit: 2020-09-01 | Discharge: 2020-09-03 | Disposition: A | Discharge status: Home / Self Care | Payer: Medicare HMO | Attending: Internal Medicine | Admitting: Internal Medicine

## 2020-09-01 ENCOUNTER — Encounter (HOSPITAL_COMMUNITY): Payer: Self-pay | Admitting: Emergency Medicine

## 2020-09-01 DIAGNOSIS — K922 Gastrointestinal hemorrhage, unspecified: Secondary | ICD-10-CM | POA: Diagnosis present

## 2020-09-01 DIAGNOSIS — I130 Hypertensive heart and chronic kidney disease with heart failure and stage 1 through stage 4 chronic kidney disease, or unspecified chronic kidney disease: Secondary | ICD-10-CM | POA: Diagnosis not present

## 2020-09-01 DIAGNOSIS — E1122 Type 2 diabetes mellitus with diabetic chronic kidney disease: Secondary | ICD-10-CM | POA: Insufficient documentation

## 2020-09-01 DIAGNOSIS — Z8719 Personal history of other diseases of the digestive system: Secondary | ICD-10-CM

## 2020-09-01 DIAGNOSIS — G2581 Restless legs syndrome: Secondary | ICD-10-CM

## 2020-09-01 DIAGNOSIS — F419 Anxiety disorder, unspecified: Secondary | ICD-10-CM

## 2020-09-01 DIAGNOSIS — N181 Chronic kidney disease, stage 1: Secondary | ICD-10-CM | POA: Insufficient documentation

## 2020-09-01 DIAGNOSIS — R1084 Generalized abdominal pain: Secondary | ICD-10-CM | POA: Insufficient documentation

## 2020-09-01 DIAGNOSIS — R112 Nausea with vomiting, unspecified: Secondary | ICD-10-CM | POA: Diagnosis present

## 2020-09-01 DIAGNOSIS — I5042 Chronic combined systolic (congestive) and diastolic (congestive) heart failure: Secondary | ICD-10-CM | POA: Diagnosis not present

## 2020-09-01 DIAGNOSIS — K920 Hematemesis: Secondary | ICD-10-CM

## 2020-09-01 DIAGNOSIS — Z79899 Other long term (current) drug therapy: Secondary | ICD-10-CM | POA: Diagnosis not present

## 2020-09-01 DIAGNOSIS — F32A Depression, unspecified: Secondary | ICD-10-CM

## 2020-09-01 DIAGNOSIS — Z20822 Contact with and (suspected) exposure to covid-19: Secondary | ICD-10-CM | POA: Diagnosis not present

## 2020-09-01 DIAGNOSIS — D649 Anemia, unspecified: Secondary | ICD-10-CM | POA: Diagnosis not present

## 2020-09-01 DIAGNOSIS — G8929 Other chronic pain: Secondary | ICD-10-CM

## 2020-09-01 DIAGNOSIS — R000 Tachycardia, unspecified: Secondary | ICD-10-CM | POA: Diagnosis present

## 2020-09-01 DIAGNOSIS — I10 Essential (primary) hypertension: Secondary | ICD-10-CM | POA: Diagnosis present

## 2020-09-01 DIAGNOSIS — D500 Iron deficiency anemia secondary to blood loss (chronic): Secondary | ICD-10-CM

## 2020-09-01 LAB — CBC
HCT: 32.2 % — ABNORMAL LOW (ref 36.0–46.0)
Hemoglobin: 10.4 g/dL — ABNORMAL LOW (ref 12.0–15.0)
MCH: 26.7 pg (ref 26.0–34.0)
MCHC: 32.3 g/dL (ref 30.0–36.0)
MCV: 82.8 fL (ref 80.0–100.0)
Platelets: 233 10*3/uL (ref 150–400)
RBC: 3.89 MIL/uL (ref 3.87–5.11)
RDW: 15 % (ref 11.5–15.5)
WBC: 6.4 10*3/uL (ref 4.0–10.5)
nRBC: 0 % (ref 0.0–0.2)

## 2020-09-01 LAB — URINALYSIS, ROUTINE W REFLEX MICROSCOPIC
Bacteria, UA: NONE SEEN
Bilirubin Urine: NEGATIVE
Glucose, UA: NEGATIVE mg/dL
Ketones, ur: NEGATIVE mg/dL
Leukocytes,Ua: NEGATIVE
Nitrite: NEGATIVE
Protein, ur: 100 mg/dL — AB
Specific Gravity, Urine: 1.009 (ref 1.005–1.030)
pH: 7 (ref 5.0–8.0)

## 2020-09-01 LAB — COMPREHENSIVE METABOLIC PANEL
ALT: 9 U/L (ref 0–44)
AST: 15 U/L (ref 15–41)
Albumin: 3.7 g/dL (ref 3.5–5.0)
Alkaline Phosphatase: 81 U/L (ref 38–126)
Anion gap: 10 (ref 5–15)
BUN: 21 mg/dL (ref 8–23)
CO2: 25 mmol/L (ref 22–32)
Calcium: 9.6 mg/dL (ref 8.9–10.3)
Chloride: 103 mmol/L (ref 98–111)
Creatinine, Ser: 0.85 mg/dL (ref 0.44–1.00)
GFR, Estimated: >60 mL/min (ref >60)
Glucose, Bld: 137 mg/dL — ABNORMAL HIGH (ref 70–99)
Potassium: 3.7 mmol/L (ref 3.5–5.1)
Sodium: 138 mmol/L (ref 135–145)
Total Bilirubin: 0.9 mg/dL (ref 0.3–1.2)
Total Protein: 6.9 g/dL (ref 6.5–8.1)

## 2020-09-01 LAB — LIPASE, BLOOD: Lipase: 32 U/L (ref 11–51)

## 2020-09-01 LAB — TYPE AND SCREEN
ABO/RH(D): O POS
Antibody Screen: NEGATIVE

## 2020-09-01 LAB — POC OCCULT BLOOD, ED: Fecal Occult Bld: NEGATIVE

## 2020-09-01 MED ORDER — SODIUM CHLORIDE 0.9 % IV SOLN
80.0000 mg | Freq: Once | INTRAVENOUS | Status: AC
  Administered 2020-09-01: 80 mg via INTRAVENOUS
  Filled 2020-09-01: qty 80

## 2020-09-01 MED ORDER — SODIUM CHLORIDE 0.9 % IV BOLUS
500.0000 mL | Freq: Once | INTRAVENOUS | Status: AC
  Administered 2020-09-01: 500 mL via INTRAVENOUS

## 2020-09-01 MED ORDER — SODIUM CHLORIDE 0.9 % IV SOLN: INTRAVENOUS | Status: DC

## 2020-09-01 MED ORDER — IOHEXOL 300 MG/ML  SOLN
100.0000 mL | Freq: Once | INTRAMUSCULAR | Status: AC | PRN
  Administered 2020-09-01: 100 mL via INTRAVENOUS

## 2020-09-01 MED ORDER — HYDROMORPHONE HCL 1 MG/ML IJ SOLN
0.5000 mg | Freq: Once | INTRAMUSCULAR | Status: AC
Start: 2020-09-01 — End: 2020-09-01
  Administered 2020-09-01: 0.5 mg via INTRAVENOUS
  Filled 2020-09-01: qty 1

## 2020-09-01 MED ORDER — ONDANSETRON HCL 4 MG/2ML IJ SOLN
4.0000 mg | Freq: Once | INTRAMUSCULAR | Status: AC
  Administered 2020-09-01: 4 mg via INTRAVENOUS
  Filled 2020-09-01: qty 2

## 2020-09-01 MED ORDER — SODIUM CHLORIDE 0.9 % IV SOLN
8.0000 mg/h | INTRAVENOUS | Status: DC
  Administered 2020-09-01 – 2020-09-02 (×3): 8 mg/h via INTRAVENOUS
  Filled 2020-09-01 (×7): qty 80

## 2020-09-01 MED ORDER — METOPROLOL TARTRATE 5 MG/5ML IV SOLN
5.0000 mg | Freq: Once | INTRAVENOUS | Status: AC
  Administered 2020-09-01: 5 mg via INTRAVENOUS
  Filled 2020-09-01: qty 5

## 2020-09-01 MED ORDER — PANTOPRAZOLE SODIUM 40 MG IV SOLR: 40.0000 mg | Freq: Two times a day (BID) | INTRAVENOUS | Status: DC

## 2020-09-01 NOTE — ED Triage Notes (Signed)
Patient presents with emesis that began this morning. Patient states some abdominal pain since throwing up. Patient denies other symptoms.

## 2020-09-01 NOTE — ED Notes (Signed)
Metoprolol administered over 10 minutes

## 2020-09-01 NOTE — ED Provider Notes (Addendum)
[facility] DEPT Provider Note   CSN: [id number] Arrival date & time: 09/01/20  2042     History Chief Complaint  Patient presents with  . Emesis  . Tachycardia    Chloe Harding is a 75 y.o. female.  Patient with acute onset of multiple episodes of vomiting and generalized abdominal pain.  Patient states she has been having black vomit.  Has vomited more than 10 times.  No diarrhea.  Patient did take Pepto-Bismol.  Denies any red blood or any black stools.  Patient states it began early this morning.  Past medical history is significant for hypertension diabetes arthritis blind systolic diastolic congestive heart failure.        Past Medical History:  Diagnosis Date  . Aortic atherosclerosis ([place])   . Arthritis   . Back pain 02/23/2020  . Chronic combined systolic and diastolic CHF (congestive heart failure) ([place])   . Coronary atherosclerosis   . Depression   . Diabetes mellitus   . Esophageal stricture   . Fall   . Hiatal hernia   . Hypercholesteremia   . Hypertension   . Obesity   . Pneumonia   . Reflux   . Renal disorder    shutting down 4 years ago  . Subdural hematoma ([place])   . Thyroid nodule   . Urinary retention   . UTI due to Klebsiella species     Patient Active Problem List   Diagnosis Date Noted  . Aspiration pneumonia ([place]) 07/08/2020  . Esophageal dilatation 07/08/2020  . Hypomagnesemia   . Acute respiratory failure ([place]) 07/07/2020  . Hypertensive emergency 07/07/2020  . Acute on chronic combined systolic and diastolic CHF (congestive heart failure) ([place]) 07/07/2020  . Back pain 02/23/2020  . Tachycardia 02/15/2020  . Fall   . Neurocognitive deficits 01/30/2020  . AKI (acute kidney injury) ([place]) 01/19/2020  . Acute encephalopathy 01/18/2020  . Diarrhea 12/23/2019  . SOB (shortness of breath)   . Demand ischemia ([place])   . CAD in native artery   . Pneumonia 11/05/2019  . Hyponatremia 11/05/2019  . Acute  CHF (congestive heart failure) ([place]) 11/05/2019  . Esophageal stricture   . Dysphagia 01/15/2019  . Symptomatic anemia 05/18/2017  . Acute-on-chronic kidney injury ([place]) 05/18/2017  . Hypertension 05/18/2017  . Hiatal hernia 05/18/2017  . History of esophageal stricture 05/18/2017  . Pure hypercholesterolemia 05/18/2017  . Diabetes mellitus 05/18/2017  . Solitary thyroid nodule 05/18/2017  . Hypotension 03/30/2015  . Sepsis ([place]) 03/30/2015  . CAP (community acquired pneumonia) 03/30/2015  . Hyperkalemia 03/30/2015  . Mild renal insufficiency 03/30/2015  . Nausea with vomiting 03/30/2015  . Diabetic neuropathy ([place]) 03/30/2015  . OBESITY 03/11/2010  . Diabetes with neurologic complications ([place]) 40/98/1191  . Dyslipidemia 12/26/2009  . ANEMIA 12/26/2009  . Major depression, recurrent ([place]) 12/26/2009  . HYPERTENSION, BENIGN ESSENTIAL 12/26/2009  . GERD 12/26/2009  . HIATAL HERNIA 12/26/2009  . CHRONIC KIDNEY DISEASE STAGE I 12/26/2009  . UTI due to Klebsiella species 12/26/2009  . Arthropathy 12/26/2009  . ARTHROSCOPY, RIGHT KNEE, HX OF 12/26/2009    Past Surgical History:  Procedure Laterality Date  . BALLOON DILATION N/A 01/25/2020   Procedure: BALLOON DILATION;  Surgeon: Carol Ada, MD;  Location: [facility];  Service: Endoscopy;  Laterality: N/A;  . CHOLECYSTECTOMY    . COLONOSCOPY WITH PROPOFOL N/A 05/20/2017   Procedure: COLONOSCOPY WITH PROPOFOL;  Surgeon: Carol Ada, MD;  Location: [facility];  Service: Endoscopy;  Laterality: N/A;  .  ESOPHAGOGASTRODUODENOSCOPY N/A 05/19/2017   Procedure: ESOPHAGOGASTRODUODENOSCOPY (EGD);  Surgeon: Juanita Craver, MD;  Location: [facility] END[facility]COPY;  Service: Endoscopy;  Laterality: N/A;  . ESOPHAGOGASTRODUODENOSCOPY (EGD) WITH PROPOFOL N/A 01/17/2019   Procedure: ESOPHAGOGASTRODUODENOSCOPY (EGD) WITH PROPOFOL;  Surgeon: Carol Ada, MD;  Location: [facility] END[facility]COPY;  Service: Endoscopy;  Laterality: N/A;  . ESOPHAGOGASTRODUODENOSCOPY (EGD)  WITH PROPOFOL N/A 01/25/2020   Procedure: ESOPHAGOGASTRODUODENOSCOPY (EGD) WITH PROPOFOL;  Surgeon: Carol Ada, MD;  Location: [facility];  Service: Endoscopy;  Laterality: N/A;  . ESOPHAGOGASTRODUODENOSCOPY (EGD) WITH PROPOFOL N/A 07/09/2020   Procedure: ESOPHAGOGASTRODUODENOSCOPY (EGD) WITH PROPOFOL;  Surgeon: Carol Ada, MD;  Location: [facility]COPY;  Service: Endoscopy;  Laterality: N/A;  . SAVORY DILATION N/A 01/17/2019   Procedure: SAVORY DILATION;  Surgeon: Carol Ada, MD;  Location: [facility] ENDOSCOPY;  Service: Endoscopy;  Laterality: N/A;     OB History   No obstetric history on file.     Family History  Problem Relation Age of Onset  . Heart attack Sister   . Heart failure Sister   . Heart attack Brother   . Heart failure Brother     Social History   Tobacco Use  . Smoking status: Never Smoker  . Smokeless tobacco: Never Used  Vaping Use  . Vaping Use: Never used  Substance Use Topics  . Alcohol use: No  . Drug use: No    Home Medications Prior to Admission medications   Medication Sig Start Date End Date Taking? Authorizing Provider  albuterol (VENTOLIN HFA) 108 (90 Base) MCG/ACT inhaler Inhale 1-2 puffs into the lungs every 4 (four) hours as needed for wheezing or shortness of breath. 06/28/20   [provider]  atorvastatin (LIPITOR) 20 MG tablet Take 1 tablet (20 mg total) by mouth daily. 02/18/20   Mercy Riding, MD  escitalopram (LEXAPRO) 20 MG tablet Take 20 mg by mouth daily. 06/21/20   [provider]  folic acid (FOLVITE) 1 MG tablet Take 1 tablet (1 mg total) by mouth daily. 02/14/20   Medina-Vargas, Monina C, NP  furosemide (LASIX) 40 MG tablet Take 1 tablet (40 mg total) by mouth daily. 07/10/20   Dessa Phi, DO  guaifenesin (TUSSIN) 100 MG/5ML syrup Take 200 mg by mouth 3 (three) times daily as needed for cough.    [provider]  HYDROcodone-acetaminophen (NORCO/VICODIN) 5-325 MG tablet Take 1 tablet by mouth 4 (four) times  daily as needed for moderate pain. 06/21/20   [provider]  hydroxychloroquine (PLAQUENIL) 200 MG tablet Take 1 tablet (200 mg total) by mouth daily. Patient not taking: Reported on 07/08/2020 02/18/20   Mercy Riding, MD  lactulose (CHRONULAC) 10 GM/15ML solution Take 10-20 g by mouth daily as needed for mild constipation. 07/06/20   [provider]  melatonin 5 MG TABS Take 10 mg by mouth at bedtime.    [provider]  metoprolol succinate (TOPROL-XL) 25 MG 24 hr tablet Take 25 mg by mouth daily. 07/04/20   [provider]  pantoprazole (PROTONIX) 40 MG tablet Take 1 tablet (40 mg total) by mouth 2 (two) times daily. 02/18/20   Mercy Riding, MD  potassium chloride (KLOR-CON) 10 MEQ tablet Take 10 mEq by mouth daily. 06/21/20   [provider]  rOPINIRole (REQUIP) 0.5 MG tablet Take 0.5 mg by mouth at bedtime. 06/21/20   [provider]    Allergies    Amlodipine besylate  Review of Systems   Review of Systems  Constitutional: Negative for chills and fever.  HENT: Negative for rhinorrhea and sore throat.   Eyes: Negative for visual disturbance.  Respiratory: Negative for cough and shortness of breath.   Cardiovascular: Negative for chest pain and leg swelling.  Gastrointestinal: Positive for abdominal pain, nausea and vomiting. Negative for blood in stool and diarrhea.  Genitourinary: Negative for dysuria.  Musculoskeletal: Negative for back pain and neck pain.  Skin: Negative for rash.  Neurological: Negative for dizziness, light-headedness and headaches.  Hematological: Does not bruise/bleed easily.  Psychiatric/Behavioral: Negative for confusion.    Physical Exam Updated Vital Signs BP (!) 152/79   Pulse (!) 126   Temp 98.6 F (37 C) (Oral)   Resp (!) 22   SpO2 96%   Physical Exam Vitals and nursing note reviewed.  Constitutional:      General: She is not in acute distress.    Appearance: She is well-developed.  HENT:      Head: Normocephalic and atraumatic.  Eyes:     Extraocular Movements: Extraocular movements intact.     Conjunctiva/sclera: Conjunctivae normal.     Pupils: Pupils are equal, round, and reactive to light.  Cardiovascular:     Rate and Rhythm: Regular rhythm. Tachycardia present.     Heart sounds: No murmur heard.   Pulmonary:     Effort: Pulmonary effort is normal. No respiratory distress.     Breath sounds: Normal breath sounds.  Abdominal:     General: There is no distension.     Palpations: Abdomen is soft.     Tenderness: There is no abdominal tenderness. There is no guarding.  Musculoskeletal:        General: No swelling. Normal range of motion.     Cervical back: Neck supple.  Skin:    General: Skin is warm and dry.     Capillary Refill: Capillary refill takes less than 2 seconds.  Neurological:     General: No focal deficit present.     Mental Status: She is alert and oriented to person, place, and time.     Cranial Nerves: No cranial nerve deficit.     Sensory: No sensory deficit.     Motor: No weakness.     ED Results / Procedures / Treatments   Labs (all labs ordered are listed, but only abnormal results are displayed) Labs Reviewed  COMPREHENSIVE METABOLIC PANEL - Abnormal; Notable for the following components:      Result Value   Glucose, Bld 137 (*)    All other components within normal limits  CBC - Abnormal; Notable for the following components:   Hemoglobin 10.4 (*)    HCT 32.2 (*)    All other components within normal limits  LIPASE, BLOOD  URINALYSIS, ROUTINE W REFLEX MICROSCOPIC  POC OCCULT BLOOD, ED  TYPE AND SCREEN    EKG EKG Interpretation  Date/Time:  Sunday September 01 2020 21:50:17 EDT Ventricular Rate:  129 PR Interval:  137 QRS Duration: 79 QT Interval:  317 QTC Calculation: 465 R Axis:   22 Text Interpretation: Sinus tachycardia Borderline low voltage, extremity leads Baseline wander in lead(s) II III aVF Confirmed by Fredia Sorrow [phone]) on 09/01/2020 10:36:52 PM   Radiology No results found.  Procedures Procedures   Medications Ordered in ED Medications  pantoprazole (PROTONIX) 80 mg in sodium chloride 0.9 % 100 mL (0.8 mg/mL) infusion (8 mg/hr Intravenous New Bag/Given 09/01/20 2247)  pantoprazole (PROTONIX) injection 40 mg (has no administration in time range)  0.9 %  sodium chloride infusion (has no  administration in time range)  pantoprazole (PROTONIX) 80 mg in sodium chloride 0.9 % 100 mL IVPB (0 mg Intravenous Stopped 09/01/20 2246)  ondansetron (ZOFRAN) injection 4 mg (4 mg Intravenous Given 09/01/20 2221)  sodium chloride 0.9 % bolus 500 mL (500 mLs Intravenous New Bag/Given 09/01/20 2221)    ED Course  I have reviewed the triage vital signs and the nursing notes.  Pertinent labs & imaging results that were available during my care of the patient were reviewed by me and considered in my medical decision making (see chart for details).    MDM Rules/Calculators/A&P                          Patient Hemoccult negative.  Patient is tachycardic that is a sinus tachycardia.  Will give IV fluids.  Patient hemoglobin is 10.4.  Her function test are normal.  Lipase is normal  To new cardiac monitoring.  Will get CT scan of the abdomen.  Type and screen was done.  But currently patient seems to be stable.  Patient also started on Protonix drip.  Patient will be turned over to the overnight physician for follow-up of the CT scan.  On reevaluation of the tachycardia.  Patient is post to be on Toprol XL.  Suspect she was not able to take that today.  That may have some reason for the tachycardia.  Patient's initial blood pressure here was also elevated at 183/106.  We will go ahead and give some IV beta-blocker. We will also get chest x-ray due to her history of congestive heart failure.  Not sure patient is actually having a GI bleed.  T scan abdomen still pending.  Chest x-ray negative.  Final Clinical  Impression(s) / ED Diagnoses Final diagnoses:  Nausea and vomiting, intractability of vomiting not specified, unspecified vomiting type  Generalized abdominal pain    Rx / DC Orders ED Discharge Orders    None       Fredia Sorrow, MD 09/01/20 2252    Fredia Sorrow, MD 09/01/20 2255    Fredia Sorrow, MD 09/01/20 2328

## 2020-09-01 NOTE — ED Notes (Signed)
Patient moved to room 18

## 2020-09-02 DIAGNOSIS — G894 Chronic pain syndrome: Secondary | ICD-10-CM

## 2020-09-02 DIAGNOSIS — G8929 Other chronic pain: Secondary | ICD-10-CM

## 2020-09-02 DIAGNOSIS — F419 Anxiety disorder, unspecified: Secondary | ICD-10-CM

## 2020-09-02 DIAGNOSIS — D649 Anemia, unspecified: Secondary | ICD-10-CM | POA: Diagnosis not present

## 2020-09-02 DIAGNOSIS — K922 Gastrointestinal hemorrhage, unspecified: Secondary | ICD-10-CM | POA: Diagnosis not present

## 2020-09-02 DIAGNOSIS — F32A Depression, unspecified: Secondary | ICD-10-CM

## 2020-09-02 DIAGNOSIS — D500 Iron deficiency anemia secondary to blood loss (chronic): Secondary | ICD-10-CM

## 2020-09-02 DIAGNOSIS — I10 Essential (primary) hypertension: Secondary | ICD-10-CM

## 2020-09-02 DIAGNOSIS — G2581 Restless legs syndrome: Secondary | ICD-10-CM

## 2020-09-02 DIAGNOSIS — K209 Esophagitis, unspecified without bleeding: Secondary | ICD-10-CM

## 2020-09-02 LAB — COMPREHENSIVE METABOLIC PANEL
ALT: 5 U/L (ref 0–44)
AST: 13 U/L — ABNORMAL LOW (ref 15–41)
Albumin: 3.2 g/dL — ABNORMAL LOW (ref 3.5–5.0)
Alkaline Phosphatase: 70 U/L (ref 38–126)
Anion gap: 7 (ref 5–15)
BUN: 16 mg/dL (ref 8–23)
CO2: 25 mmol/L (ref 22–32)
Calcium: 8.9 mg/dL (ref 8.9–10.3)
Chloride: 110 mmol/L (ref 98–111)
Creatinine, Ser: 0.74 mg/dL (ref 0.44–1.00)
GFR, Estimated: >60 mL/min (ref >60)
Glucose, Bld: 100 mg/dL — ABNORMAL HIGH (ref 70–99)
Potassium: 3.6 mmol/L (ref 3.5–5.1)
Sodium: 142 mmol/L (ref 135–145)
Total Bilirubin: 0.9 mg/dL (ref 0.3–1.2)
Total Protein: 5.9 g/dL — ABNORMAL LOW (ref 6.5–8.1)

## 2020-09-02 LAB — HEMOGLOBIN AND HEMATOCRIT, BLOOD
HCT: 26.8 % — ABNORMAL LOW (ref 36.0–46.0)
Hemoglobin: 8.5 g/dL — ABNORMAL LOW (ref 12.0–15.0)

## 2020-09-02 LAB — CBC
HCT: 27 % — ABNORMAL LOW (ref 36.0–46.0)
HCT: 27.1 % — ABNORMAL LOW (ref 36.0–46.0)
HCT: 27.5 % — ABNORMAL LOW (ref 36.0–46.0)
Hemoglobin: 8.5 g/dL — ABNORMAL LOW (ref 12.0–15.0)
Hemoglobin: 8.4 g/dL — ABNORMAL LOW (ref 12.0–15.0)
Hemoglobin: 8.6 g/dL — ABNORMAL LOW (ref 12.0–15.0)
MCH: 26.4 pg (ref 26.0–34.0)
MCH: 26.3 pg (ref 26.0–34.0)
MCH: 26.5 pg (ref 26.0–34.0)
MCHC: 31.5 g/dL (ref 30.0–36.0)
MCHC: 31 g/dL (ref 30.0–36.0)
MCHC: 31.3 g/dL (ref 30.0–36.0)
MCV: 83.9 fL (ref 80.0–100.0)
MCV: 85 fL (ref 80.0–100.0)
MCV: 84.6 fL (ref 80.0–100.0)
Platelets: 191 10*3/uL (ref 150–400)
Platelets: 183 10*3/uL (ref 150–400)
Platelets: 189 10*3/uL (ref 150–400)
RBC: 3.22 MIL/uL — ABNORMAL LOW (ref 3.87–5.11)
RBC: 3.19 MIL/uL — ABNORMAL LOW (ref 3.87–5.11)
RBC: 3.25 MIL/uL — ABNORMAL LOW (ref 3.87–5.11)
RDW: 15.2 % (ref 11.5–15.5)
RDW: 15.3 % (ref 11.5–15.5)
RDW: 15.3 % (ref 11.5–15.5)
WBC: 6 10*3/uL (ref 4.0–10.5)
WBC: 4 10*3/uL (ref 4.0–10.5)
WBC: 4 10*3/uL (ref 4.0–10.5)
nRBC: 0 % (ref 0.0–0.2)
nRBC: 0 % (ref 0.0–0.2)
nRBC: 0 % (ref 0.0–0.2)

## 2020-09-02 LAB — MAGNESIUM: Magnesium: 1.6 mg/dL — ABNORMAL LOW (ref 1.7–2.4)

## 2020-09-02 LAB — SARS CORONAVIRUS 2 (TAT 6-24 HRS): SARS Coronavirus 2: NEGATIVE

## 2020-09-02 LAB — FERRITIN: Ferritin: 16 ng/mL (ref 11–307)

## 2020-09-02 LAB — IRON AND TIBC
Iron: 31 ug/dL (ref 28–170)
Saturation Ratios: 9 % — ABNORMAL LOW (ref 10.4–31.8)
TIBC: 338 ug/dL (ref 250–450)
UIBC: 307 ug/dL

## 2020-09-02 LAB — PHOSPHORUS: Phosphorus: 3.7 mg/dL (ref 2.5–4.6)

## 2020-09-02 LAB — MRSA PCR SCREENING: MRSA by PCR: NEGATIVE

## 2020-09-02 LAB — GLUCOSE, CAPILLARY: Glucose-Capillary: 167 mg/dL — ABNORMAL HIGH (ref 70–99)

## 2020-09-02 MED ORDER — ALBUTEROL SULFATE (2.5 MG/3ML) 0.083% IN NEBU: 2.5000 mg | INHALATION_SOLUTION | Freq: Four times a day (QID) | RESPIRATORY_TRACT | Status: DC | PRN

## 2020-09-02 MED ORDER — ROPINIROLE HCL 1 MG PO TABS
2.0000 mg | ORAL_TABLET | Freq: Every day | ORAL | Status: DC
  Administered 2020-09-02: 2 mg via ORAL
  Filled 2020-09-02: qty 2

## 2020-09-02 MED ORDER — HYDROCODONE-ACETAMINOPHEN 10-325 MG PO TABS
1.0000 | ORAL_TABLET | Freq: Four times a day (QID) | ORAL | Status: DC | PRN
  Administered 2020-09-02 (×3): 1 via ORAL
  Filled 2020-09-02 (×3): qty 1

## 2020-09-02 MED ORDER — ONDANSETRON HCL 4 MG/2ML IJ SOLN: 4.0000 mg | Freq: Four times a day (QID) | INTRAMUSCULAR | Status: DC | PRN

## 2020-09-02 MED ORDER — ALBUTEROL SULFATE HFA 108 (90 BASE) MCG/ACT IN AERS: 1.0000 | INHALATION_SPRAY | RESPIRATORY_TRACT | Status: DC | PRN

## 2020-09-02 MED ORDER — ESCITALOPRAM OXALATE 20 MG PO TABS
20.0000 mg | ORAL_TABLET | Freq: Every day | ORAL | Status: DC
  Administered 2020-09-02 – 2020-09-03 (×2): 20 mg via ORAL
  Filled 2020-09-02 (×2): qty 1

## 2020-09-02 MED ORDER — METOPROLOL SUCCINATE ER 25 MG PO TB24
25.0000 mg | ORAL_TABLET | Freq: Every day | ORAL | Status: DC
  Administered 2020-09-02 – 2020-09-03 (×2): 25 mg via ORAL
  Filled 2020-09-02 (×2): qty 1

## 2020-09-02 MED ORDER — ONDANSETRON HCL 4 MG PO TABS: 4.0000 mg | ORAL_TABLET | Freq: Four times a day (QID) | ORAL | Status: DC | PRN

## 2020-09-02 MED ORDER — CYCLOBENZAPRINE HCL 5 MG PO TABS
5.0000 mg | ORAL_TABLET | Freq: Every day | ORAL | Status: DC
  Administered 2020-09-02: 5 mg via ORAL
  Filled 2020-09-02: qty 1

## 2020-09-02 MED ORDER — ATORVASTATIN CALCIUM 20 MG PO TABS
20.0000 mg | ORAL_TABLET | Freq: Every day | ORAL | Status: DC
  Administered 2020-09-02: 20 mg via ORAL
  Filled 2020-09-02: qty 1

## 2020-09-02 NOTE — H&P (Signed)
History and Physical  Patient Name: Chloe Harding     ZHG:[id number]    DOB: 1946-04-07    DOA: 09/01/2020 PCP: [name], NP  Patient coming from: Home  Chief Complaint: Nausea, vomiting black liquid    HPI: Chloe Harding is a 75 y.o. female, with PMH of chronic combined systolic and diastolic CHF (EF 19-62%, G1 DD, LV global hypokinesis by TTE 11/06/2019), hypertension, hyperlipidemia, rheumatoid arthritis, depression, dysphagia and history of esophageal stenosis s/p dilation 01/25/2020 and EGD on 07/09/20 that showed esophageal stricture and severe esophagitis, who presented to the ER on 09/01/2020 with nausea, concern for upper GI bleed  Patient states prior to initial presentation, she started having nausea, some abdominal pain.  She had several episodes of black vomit.  When nursing tried to drink she would vomit again.  Denies any black stool.  She is on chronic hydrocodone for pain but denies any NSAIDs.  She says she is not on any blood thinners.  Did take Pepto-Bismol prior.  She does have a history of esophageal stricture and esophagitis. Patient was hospitalized in February of this year dyspnea thought to be related to acute hypoxic respiratory failure due to aspiration and flash pulmonary edema/acute on chronic CHF.  During hospitalization she had an EGD that demonstrated proximal esophageal strictures and ulceration.  She had a stricture dilated.    ED course: -Vitals on admission: Afebrile, heart rate 133, blood pressure 183/106, maintaining sats on room air -Labs on initial presentation: Sodium 138, potassium 3.7, chloride 103, bicarb 25, glucose 137, BUN 21, creatinine 0.85, WBC 6.4, hemoglobin initially 10.4 but decreased to 8.5 after IV fluids -Imaging obtained on admission: Chest x-ray unremarkable.  CT abdomen pelvis with contrast showed findings possibly of esophagitis -In the ED the patient was given IV fluids, Protonix drip, Zofran, metoprolol, Dilaudid, and the  hospitalist service was contacted for further evaluation and management.     ROS: A complete and thorough 12 point review of systems obtained, negative listed in HPI.     Past Medical History:  Diagnosis Date  . Aortic atherosclerosis ([place])   . Arthritis   . Back pain 02/23/2020  . Chronic combined systolic and diastolic CHF (congestive heart failure) ([place])   . Coronary atherosclerosis   . Depression   . Diabetes mellitus   . Esophageal stricture   . Fall   . Hiatal hernia   . Hypercholesteremia   . Hypertension   . Obesity   . Pneumonia   . Reflux   . Renal disorder    shutting down 4 years ago  . Subdural hematoma ([place])   . Thyroid nodule   . Urinary retention   . UTI due to Klebsiella species     Past Surgical History:  Procedure Laterality Date  . BALLOON DILATION N/A 01/25/2020   Procedure: BALLOON DILATION;  Surgeon: Carol Ada, MD;  Location: [facility];  Service: Endoscopy;  Laterality: N/A;  . CHOLECYSTECTOMY    . COLONOSCOPY WITH PROPOFOL N/A 05/20/2017   Procedure: COLONOSCOPY WITH PROPOFOL;  Surgeon: Carol Ada, MD;  Location: [facility];  Service: Endoscopy;  Laterality: N/A;  . ESOPHAGOGASTRODUODENOSCOPY N/A 05/19/2017   Procedure: ESOPHAGOGASTRODUODENOSCOPY (EGD);  Surgeon: Juanita Craver, MD;  Location[facility] ENDOSCOPY;  Service: Endoscopy;  Laterality: N/A;  . ESOPHAGOGASTRODUODENOSCOPY (EGD) WITH PROPOFOL N/A 01/17/2019   Procedure: ESOPHAGOGASTRODUODENOSCOPY (EGD) WITH PROPOFOL;  Surgeon: Carol Ada, MD;  Location: [facility] ENDOSCOPY;  Service: Endoscopy;  Laterality: N/A;  . ESOPHAGOGASTRODUODENOSCOPY (EGD) WITH PROPOFOL N/A 01/25/2020  Procedure: ESOPHAGOGASTRODUODENOSCOPY (EGD) WITH PROPOFOL;  Surgeon: Carol Ada, MD;  Location: [facility] ENDOSCOPY;  Service: Endoscopy;  Laterality: N/A;  . ESOPHAGOGASTRODUODENOSCOPY (EGD) WITH PROPOFOL N/A 07/09/2020   Procedure: ESOPHAGOGASTRODUODENOSCOPY (EGD) WITH PROPOFOL;  Surgeon: Carol Ada, MD;  Location: [facility]  ENDOSCOPY;  Service: Endoscopy;  Laterality: N/A;  . SAVORY DILATION N/A 01/17/2019   Procedure: SAVORY DILATION;  Surgeon: Carol Ada, MD;  Location: [facility] ENDOSCOPY;  Service: Endoscopy;  Laterality: N/A;    Social History: Patient lives at home.  The patient walks without assistance.  Non smoker.  Allergies  Allergen Reactions  . Amlodipine Besylate Swelling    swelling    Family history: family history includes Heart attack in her brother and sister; Heart failure in her brother and sister.  Prior to Admission medications   Medication Sig Start Date End Date Taking? Authorizing Provider  albuterol (VENTOLIN HFA) 108 (90 Base) MCG/ACT inhaler Inhale 1-2 puffs into the lungs every 4 (four) hours as needed for wheezing or shortness of breath. 06/28/20   [provider]  atorvastatin (LIPITOR) 20 MG tablet Take 1 tablet (20 mg total) by mouth daily. 02/18/20   Mercy Riding, MD  escitalopram (LEXAPRO) 20 MG tablet Take 20 mg by mouth daily. 06/21/20   [provider]  folic acid (FOLVITE) 1 MG tablet Take 1 tablet (1 mg total) by mouth daily. 02/14/20   Medina-Vargas, Monina C, NP  furosemide (LASIX) 40 MG tablet Take 1 tablet (40 mg total) by mouth daily. 07/10/20   Dessa Phi, DO  guaifenesin (TUSSIN) 100 MG/5ML syrup Take 200 mg by mouth 3 (three) times daily as needed for cough.    [provider]  HYDROcodone-acetaminophen (NORCO/VICODIN) 5-325 MG tablet Take 1 tablet by mouth 4 (four) times daily as needed for moderate pain. 06/21/20   [provider]  hydroxychloroquine (PLAQUENIL) 200 MG tablet Take 1 tablet (200 mg total) by mouth daily. Patient not taking: Reported on 07/08/2020 02/18/20   Mercy Riding, MD  lactulose (CHRONULAC) 10 GM/15ML solution Take 10-20 g by mouth daily as needed for mild constipation. 07/06/20   [provider]  melatonin 5 MG TABS Take 10 mg by mouth at bedtime.    [provider]  metoprolol succinate  (TOPROL-XL) 25 MG 24 hr tablet Take 25 mg by mouth daily. 07/04/20   [provider]  pantoprazole (PROTONIX) 40 MG tablet Take 1 tablet (40 mg total) by mouth 2 (two) times daily. 02/18/20   Mercy Riding, MD  potassium chloride (KLOR-CON) 10 MEQ tablet Take 10 mEq by mouth daily. 06/21/20   [provider]  rOPINIRole (REQUIP) 0.5 MG tablet Take 0.5 mg by mouth at bedtime. 06/21/20   [provider]       Physical Exam: BP 127/82   Pulse 98   Temp 98.6 F (37 C) (Oral)   Resp 19   SpO2 95%   General appearance: Well-developed, adult female, alert and in no acute distress . Eyes: Anicteric, conjunctiva pink, lids and lashes normal. PERRL.    ENT: No nasal deformity, discharge, epistaxis.  Hearing intact. OP moist without lesions.   Neck: No neck masses.  Trachea midline.  No thyromegaly/tenderness. Lymph: No cervical or supraclavicular lymphadenopathy. Skin: Warm and dry.  No jaundice.  No suspicious rashes or lesions. Cardiac: RRR, nl S1-S2, no murmurs appreciated.    Trace LE edema.  Radial and pedal pulses 2+ and symmetric. Respiratory: Normal respiratory rate and rhythm.  CTAB without rales  or wheezes. Abdomen: Abdomen soft.  No tenderness with palpation. No ascites, distension, hepatosplenomegaly.   MSK: No deformities or effusions of the large joints of the upper or lower extremities bilaterally.  No cyanosis or clubbing. Neuro: Cranial nerves 2 through 12 grossly intact.  Sensation intact to light touch. Speech is fluent.  Marland Kitchen    Psych: Sensorium intact and responding to questions, attention normal.  Behavior appropriate.  Judgment and insight appear normal.    Labs on Admission:  I have personally reviewed following labs and imaging studies: CBC: Recent Labs  Lab 09/01/20 2109 09/02/20 0037  WBC 6.4  --   HGB 10.4* 8.5*  HCT 32.2* 26.8*  MCV 82.8  --   PLT 233  --    Basic Metabolic Panel: Recent Labs  Lab 09/01/20 2109  NA 138  K 3.7  CL  103  CO2 25  GLUCOSE 137*  BUN 21  CREATININE 0.85  CALCIUM 9.6   GFR: CrCl cannot be calculated (Unknown ideal weight.).  Liver Function Tests: Recent Labs  Lab 09/01/20 2109  AST 15  ALT 9  ALKPHOS 81  BILITOT 0.9  PROT 6.9  ALBUMIN 3.7   Recent Labs  Lab 09/01/20 2109  LIPASE 32   No results for input(s): AMMONIA in the last 168 hours. Coagulation Profile: No results for input(s): INR, PROTIME in the last 168 hours. Cardiac Enzymes: No results for input(s): CKTOTAL, CKMB, CKMBINDEX, TROPONINI in the last 168 hours. BNP (last 3 results) No results for input(s): PROBNP in the last 8760 hours. HbA1C: No results for input(s): HGBA1C in the last 72 hours. CBG: No results for input(s): GLUCAP in the last 168 hours. Lipid Profile: No results for input(s): CHOL, HDL, LDLCALC, TRIG, CHOLHDL, LDLDIRECT in the last 72 hours. Thyroid Function Tests: No results for input(s): TSH, T4TOTAL, FREET4, T3FREE, THYROIDAB in the last 72 hours. Anemia Panel: No results for input(s): VITAMINB12, FOLATE, FERRITIN, TIBC, IRON, RETICCTPCT in the last 72 hours.   No results found for this or any previous visit (from the past 240 hour(s)).         Radiological Exams on Admission: Personally reviewed imaging which shows: CT abdomen pelvis with contrast showed findings possibly consistent with esophagitis  CT Abdomen Pelvis W Contrast  Result Date: 09/02/2020 CLINICAL DATA:  Nausea, vomiting, acute nonlocalized abdominal pain EXAM: CT ABDOMEN AND PELVIS WITH CONTRAST TECHNIQUE: Multidetector CT imaging of the abdomen and pelvis was performed using the standard protocol following bolus administration of intravenous contrast. CONTRAST:  162mL OMNIPAQUE IOHEXOL 300 MG/ML  SOLN COMPARISON:  02/15/2020 FINDINGS: Lower chest: Mild right basilar atelectasis. Mild elevation of the right hemidiaphragm. The visualized lung bases are otherwise clear. Small hiatal hernia. There is circumferential  thickening of the distal esophagus suggesting changes of esophagitis, possibly reflux esophagitis. Small fluid noted within the distal esophagus. Moderate coronary artery calcification largely within the left anterior descending coronary artery. Global cardiac size within normal limits. No pericardial effusion. Hepatobiliary: Status post cholecystectomy. Liver unremarkable. No intra or extrahepatic biliary ductal dilation. Pancreas: Atrophic, but otherwise unremarkable. Spleen: Subcentimeter hypodensity within the a inferior pole is nonspecific but may represent a tiny cyst or hemangioma in a patient without a history of malignancy. Adrenals/Urinary Tract: Adrenal glands are unremarkable. Kidneys are normal, without renal calculi, focal lesion, or hydronephrosis. Bladder is unremarkable. Stomach/Bowel: Moderate sigmoid diverticulosis. The stomach, small bowel, and large bowel are otherwise unremarkable. Appendix absent. No free intraperitoneal gas or fluid. Vascular/Lymphatic: Moderate aortoiliac atherosclerotic calcification.  Particularly prominent atherosclerotic calcification noted at the origin of the renal arteries bilaterally. The degree of stenosis, however, is not well assessed on this non arteriographic study. No aortic aneurysm. No pathologic adenopathy within the abdomen and pelvis. Reproductive: Uterus and bilateral adnexa are unremarkable. Other: No abdominal wall hernia.  Rectum unremarkable. Musculoskeletal: No acute bone abnormality. Degenerative changes are seen throughout the thoracolumbar spine. No lytic or blastic bone lesions are seen. IMPRESSION: No acute intra-abdominal pathology identified. No definite radiographic explanation for the patient's reported symptoms. Circumferential wall thickening involving the distal esophagus in keeping with changes of esophagitis, possibly reflux esophagitis given the associated small hiatal hernia and fluid noted within the distal esophageal lumen. Moderate  coronary artery calcification Moderate sigmoid diverticulosis without superimposed inflammatory change. Peripheral vascular disease. Particularly prominent atherosclerotic calcification noted at the renal ostia noted. There is clinical evidence of hemodynamically significant renal artery stenosis, CT arteriography may be more helpful for further evaluation. Aortic Atherosclerosis (ICD10-I70.0). Electronically Signed   By: Fidela Salisbury MD   On: 09/02/2020 00:19   DG Chest Port 1 View  Result Date: 09/01/2020 CLINICAL DATA:  Abdominal pain and tachycardia EXAM: PORTABLE CHEST 1 VIEW COMPARISON:  07/07/2020 FINDINGS: Cardiac shadow is stable. Aortic calcifications are seen. The lungs are hypoinflated but clear. No bony abnormality is noted. IMPRESSION: No active disease. Electronically Signed   By: Inez Catalina M.D.   On: 09/01/2020 23:11        Assessment/Plan   1.  Concern for upper GI bleed - Patient presented with nausea and multiple episodes of black emesis - Stool occult blood negative on admission - CT abdomen pelvis showed signs concerning for esophagitis -Patient has history of esophagitis, esophageal stricture with last EGD performed in February 2022 - Hemoglobin initially 10.5 but decreased to 8.5 after IV fluids - Continue Protonix. - Serial CBC monitoring - No transfusion warranted at this time, will monitor closely - N.p.o. for now - Recommend GI consult in the morning  2.  Anemia -Likely secondary to GI blood loss but does appear to have chronic component - Iron studies ordered - No transfusion warranted at this time - See further plans above  3.  Chronic pain syndrome - Resume home pain regiment - Avoid NSAIDs given likely GI bleed  4.  Essential hypertension - Blood pressure mildly elevated on admission but has improved - Resume home metoprolol once home meds reconciled - Hold Lasix given concern for GI bleed  5.  Depression/anxiety -Resume home Lexapro once  meds reconciled  6.  Restless leg syndrome -Resume home Requip once meds are reconciled     DVT prophylaxis: SCDs Code Status: Full Family Communication: Patient only Disposition Plan: Anticipate discharge home when medically optimized Consults called: None Admission status: Stepdown unit for closer monitoring     Medical decision making: Patient seen at 1:44 AM on 09/02/2020.  The patient was discussed with ER provider.  What exists of the patient's chart was reviewed in depth and summarized above.  Clinical condition: Watcher.        Doran Heater [facility] Please page though [place] or Epic secure chat:  For password, contact charge nurse

## 2020-09-02 NOTE — Consult Note (Signed)
Reason for Consult: Anemia and history of an LA Grade D esophagitis Referring Physician: [facility]  Chloe Harding HPI: This is a 75 year old female who is well-known to me for her history of LA Grade D esophagitis and esophageal strictures admitted for vomiting and abdominal pain.  Chloe Harding having an episode of black vomit, but she did take Peptobismol.  Further work up in the ER showed that her HGB dropped from 10.4 g/dL down to 8.5 g/dL.  Her baseline HGB ranges from 8-10 g/dL.  She was recently admitted in 06/2020 and a repeat EGD was performed on 07/09/2020 with findings of a recurrent LA Grade D esophagitis.  There was also several strictures in the esophagus and the distal stricture, at the GE junction, was dilated with passage of the endoscope.  At that time she reports that she was taking her pantoprazole on a BID basis.  Past Medical History:  Diagnosis Date  . Aortic atherosclerosis ([place])   . Arthritis   . Back pain 02/23/2020  . Chronic combined systolic and diastolic CHF (congestive heart failure) ([place])   . Coronary atherosclerosis   . Depression   . Diabetes mellitus   . Esophageal stricture   . Fall   . Hiatal hernia   . Hypercholesteremia   . Hypertension   . Obesity   . Pneumonia   . Reflux   . Renal disorder    shutting down 4 years ago  . Subdural hematoma ([place])   . Thyroid nodule   . Urinary retention   . UTI due to Klebsiella species     Past Surgical History:  Procedure Laterality Date  . BALLOON DILATION N/A 01/25/2020   Procedure: BALLOON DILATION;  Surgeon: Carol Ada, MD;  Location: [facility];  Service: Endoscopy;  Laterality: N/A;  . CHOLECYSTECTOMY    . COLONOSCOPY WITH PROPOFOL N/A 05/20/2017   Procedure: COLONOSCOPY WITH PROPOFOL;  Surgeon: Carol Ada, MD;  Location: [facility];  Service: Endoscopy;  Laterality: N/A;  . ESOPHAGOGASTRODUODENOSCOPY N/A 05/19/2017   Procedure: ESOPHAGOGASTRODUODENOSCOPY (EGD);  Surgeon: Juanita Craver, MD;  Location: [facility] ENDOSCOPY;  Service: Endoscopy;  Laterality: N/A;  . ESOPHAGOGASTRODUODENOSCOPY (EGD) WITH PROPOFOL N/A 01/17/2019   Procedure: ESOPHAGOGASTRODUODENOSCOPY (EGD) WITH PROPOFOL;  Surgeon: Carol Ada, MD;  Location: [facility] ENDOSCOPY;  Service: Endoscopy;  Laterality: N/A;  . ESOPHAGOGASTRODUODENOSCOPY (EGD) WITH PROPOFOL N/A 01/25/2020   Procedure: ESOPHAGOGASTRODUODENOSCOPY (EGD) WITH PROPOFOL;  Surgeon: Carol Ada, MD;  Location: [facility]OSCOPY;  Service: Endoscopy;  Laterality: N/A;  . ESOPHAGOGASTRODUODENOSCOPY (EGD) WITH PROPOFOL N/A 07/09/2020   Procedure: ESOPHAGOGASTRODUODENOSCOPY (EGD) WITH PROPOFOL;  Surgeon: Carol Ada, MD;  Location: [facility] ENDOSCOPY;  Service: Endoscopy;  Laterality: N/A;  . SAVORY DILATION N/A 01/17/2019   Procedure: SAVORY DILATION;  Surgeon: Carol Ada, MD;  Location: [facility] ENDOSCOPY;  Service: Endoscopy;  Laterality: N/A;    Family History  Problem Relation Age of Onset  . Heart attack Sister   . Heart failure Sister   . Heart attack Brother   . Heart failure Brother     Social History:  reports that she has never smoked. She has never used smokeless tobacco. She reports that she does not drink alcohol and does not use drugs.  Allergies:  Allergies  Allergen Reactions  . Amlodipine Besylate Swelling    Medications:  Scheduled: . atorvastatin  20 mg Oral q1800  . cyclobenzaprine  5 mg Oral QHS  . escitalopram  20 mg Oral Daily  . metoprolol succinate  25  mg Oral Daily  . [START ON 09/05/2020] pantoprazole  40 mg Intravenous Q12H  . rOPINIRole  2 mg Oral QHS   Continuous: . sodium chloride 75 mL/hr at 09/02/20 1052  . pantoprozole (PROTONIX) infusion 8 mg/hr (09/02/20 1126)    Results for orders placed or performed during the hospital encounter of 09/01/20 (from the past 24 hour(s))  Lipase, blood     Status: None   Collection Time: 09/01/20  9:09 PM  Result Value Ref Range   Lipase 32 11 - 51 U/L  Comprehensive metabolic  panel     Status: Abnormal   Collection Time: 09/01/20  9:09 PM  Result Value Ref Range   Sodium 138 135 - 145 mmol/L   Potassium 3.7 3.5 - 5.1 mmol/L   Chloride 103 98 - 111 mmol/L   CO2 25 22 - 32 mmol/L   Glucose, Bld 137 (H) 70 - 99 mg/dL   BUN 21 8 - 23 mg/dL   Creatinine, Ser 0.85 0.44 - 1.00 mg/dL   Calcium 9.6 8.9 - 10.3 mg/dL   Total Protein 6.9 6.5 - 8.1 g/dL   Albumin 3.7 3.5 - 5.0 g/dL   AST 15 15 - 41 U/L   ALT 9 0 - 44 U/L   Alkaline Phosphatase 81 38 - 126 U/L   Total Bilirubin 0.9 0.3 - 1.2 mg/dL   GFR, Estimated >60 >60 mL/min   Anion gap 10 5 - 15  CBC     Status: Abnormal   Collection Time: 09/01/20  9:09 PM  Result Value Ref Range   WBC 6.4 4.0 - 10.5 K/uL   RBC 3.89 3.87 - 5.11 MIL/uL   Hemoglobin 10.4 (L) 12.0 - 15.0 g/dL   HCT 32.2 (L) 36.0 - 46.0 %   MCV 82.8 80.0 - 100.0 fL   MCH 26.7 26.0 - 34.0 pg   MCHC 32.3 30.0 - 36.0 g/dL   RDW 15.0 11.5 - 15.5 %   Platelets 233 150 - 400 K/uL   nRBC 0.0 0.0 - 0.2 %  Type and screen [facility]     Status: None   Collection Time: 09/01/20  9:45 PM  Result Value Ref Range   ABO/RH(D) O POS    Antibody Screen NEG    Sample Expiration      09/04/2020,2359 Performed at [facility], [place] [address], [place], [place] [address]   POC occult blood, ED RN will collect     Status: None   Collection Time: 09/01/20 10:33 PM  Result Value Ref Range   Fecal Occult Bld NEGATIVE NEGATIVE  Urinalysis, Routine w reflex microscopic Urine, Clean Catch     Status: Abnormal   Collection Time: 09/01/20 10:49 PM  Result Value Ref Range   Color, Urine STRAW (A) YELLOW   APPearance CLEAR CLEAR   Specific Gravity, Urine 1.009 1.005 - 1.030   pH 7.0 5.0 - 8.0   Glucose, UA NEGATIVE NEGATIVE mg/dL   Hgb urine dipstick SMALL (A) NEGATIVE   Bilirubin Urine NEGATIVE NEGATIVE   Ketones, ur NEGATIVE NEGATIVE mg/dL   Protein, ur 100 (A) NEGATIVE mg/dL   Nitrite NEGATIVE NEGATIVE    Leukocytes,Ua NEGATIVE NEGATIVE   RBC / HPF 0-5 0 - 5 RBC/hpf   WBC, UA 0-5 0 - 5 WBC/hpf   Bacteria, UA NONE SEEN NONE SEEN  Hemoglobin and hematocrit, blood     Status: Abnormal   Collection Time: 09/02/20 12:37 AM  Result Value Ref Range  Hemoglobin 8.5 (L) 12.0 - 15.0 g/dL   HCT 26.8 (L) 36.0 - 46.0 %  CBC     Status: Abnormal   Collection Time: 09/02/20 12:37 AM  Result Value Ref Range   WBC 6.0 4.0 - 10.5 K/uL   RBC 3.22 (L) 3.87 - 5.11 MIL/uL   Hemoglobin 8.5 (L) 12.0 - 15.0 g/dL   HCT 27.0 (L) 36.0 - 46.0 %   MCV 83.9 80.0 - 100.0 fL   MCH 26.4 26.0 - 34.0 pg   MCHC 31.5 30.0 - 36.0 g/dL   RDW 15.2 11.5 - 15.5 %   Platelets 191 150 - 400 K/uL   nRBC 0.0 0.0 - 0.2 %  SARS CORONAVIRUS 2 (TAT 6-24 HRS) Nasopharyngeal Nasopharyngeal Swab     Status: None   Collection Time: 09/02/20  1:05 AM   Specimen: Nasopharyngeal Swab  Result Value Ref Range   SARS Coronavirus 2 NEGATIVE NEGATIVE  MRSA PCR Screening     Status: None   Collection Time: 09/02/20  3:37 AM   Specimen: Nasopharyngeal  Result Value Ref Range   MRSA by PCR NEGATIVE NEGATIVE  Comprehensive metabolic panel     Status: Abnormal   Collection Time: 09/02/20  8:53 AM  Result Value Ref Range   Sodium 142 135 - 145 mmol/L   Potassium 3.6 3.5 - 5.1 mmol/L   Chloride 110 98 - 111 mmol/L   CO2 25 22 - 32 mmol/L   Glucose, Bld 100 (H) 70 - 99 mg/dL   BUN 16 8 - 23 mg/dL   Creatinine, Ser 0.74 0.44 - 1.00 mg/dL   Calcium 8.9 8.9 - 10.3 mg/dL   Total Protein 5.9 (L) 6.5 - 8.1 g/dL   Albumin 3.2 (L) 3.5 - 5.0 g/dL   AST 13 (L) 15 - 41 U/L   ALT 5 0 - 44 U/L   Alkaline Phosphatase 70 38 - 126 U/L   Total Bilirubin 0.9 0.3 - 1.2 mg/dL   GFR, Estimated >60 >60 mL/min   Anion gap 7 5 - 15  Magnesium     Status: Abnormal   Collection Time: 09/02/20  8:53 AM  Result Value Ref Range   Magnesium 1.6 (L) 1.7 - 2.4 mg/dL  Phosphorus     Status: None   Collection Time: 09/02/20  8:53 AM  Result Value Ref Range    Phosphorus 3.7 2.5 - 4.6 mg/dL  Iron and TIBC     Status: Abnormal   Collection Time: 09/02/20  8:53 AM  Result Value Ref Range   Iron 31 28 - 170 ug/dL   TIBC 338 250 - 450 ug/dL   Saturation Ratios 9 (L) 10.4 - 31.8 %   UIBC 307 ug/dL  Ferritin     Status: None   Collection Time: 09/02/20  8:53 AM  Result Value Ref Range   Ferritin 16 11 - 307 ng/mL  CBC     Status: Abnormal   Collection Time: 09/02/20  2:07 PM  Result Value Ref Range   WBC 4.0 4.0 - 10.5 K/uL   RBC 3.19 (L) 3.87 - 5.11 MIL/uL   Hemoglobin 8.4 (L) 12.0 - 15.0 g/dL   HCT 27.1 (L) 36.0 - 46.0 %   MCV 85.0 80.0 - 100.0 fL   MCH 26.3 26.0 - 34.0 pg   MCHC 31.0 30.0 - 36.0 g/dL   RDW 15.3 11.5 - 15.5 %   Platelets 183 150 - 400 K/uL   nRBC 0.0 0.0 - 0.2 %  CT Abdomen Pelvis W Contrast  Result Date: 09/02/2020 CLINICAL DATA:  Nausea, vomiting, acute nonlocalized abdominal pain EXAM: CT ABDOMEN AND PELVIS WITH CONTRAST TECHNIQUE: Multidetector CT imaging of the abdomen and pelvis was performed using the standard protocol following bolus administration of intravenous contrast. CONTRAST:  124mL OMNIPAQUE IOHEXOL 300 MG/ML  SOLN COMPARISON:  02/15/2020 FINDINGS: Lower chest: Mild right basilar atelectasis. Mild elevation of the right hemidiaphragm. The visualized lung bases are otherwise clear. Small hiatal hernia. There is circumferential thickening of the distal esophagus suggesting changes of esophagitis, possibly reflux esophagitis. Small fluid noted within the distal esophagus. Moderate coronary artery calcification largely within the left anterior descending coronary artery. Global cardiac size within normal limits. No pericardial effusion. Hepatobiliary: Status post cholecystectomy. Liver unremarkable. No intra or extrahepatic biliary ductal dilation. Pancreas: Atrophic, but otherwise unremarkable. Spleen: Subcentimeter hypodensity within the a inferior pole is nonspecific but may represent a tiny cyst or hemangioma in  a patient without a history of malignancy. Adrenals/Urinary Tract: Adrenal glands are unremarkable. Kidneys are normal, without renal calculi, focal lesion, or hydronephrosis. Bladder is unremarkable. Stomach/Bowel: Moderate sigmoid diverticulosis. The stomach, small bowel, and large bowel are otherwise unremarkable. Appendix absent. No free intraperitoneal gas or fluid. Vascular/Lymphatic: Moderate aortoiliac atherosclerotic calcification. Particularly prominent atherosclerotic calcification noted at the origin of the renal arteries bilaterally. The degree of stenosis, however, is not well assessed on this non arteriographic study. No aortic aneurysm. No pathologic adenopathy within the abdomen and pelvis. Reproductive: Uterus and bilateral adnexa are unremarkable. Other: No abdominal wall hernia.  Rectum unremarkable. Musculoskeletal: No acute bone abnormality. Degenerative changes are seen throughout the thoracolumbar spine. No lytic or blastic bone lesions are seen. IMPRESSION: No acute intra-abdominal pathology identified. No definite radiographic explanation for the patient's reported symptoms. Circumferential wall thickening involving the distal esophagus in keeping with changes of esophagitis, possibly reflux esophagitis given the associated small hiatal hernia and fluid noted within the distal esophageal lumen. Moderate coronary artery calcification Moderate sigmoid diverticulosis without superimposed inflammatory change. Peripheral vascular disease. Particularly prominent atherosclerotic calcification noted at the renal ostia noted. There is clinical evidence of hemodynamically significant renal artery stenosis, CT arteriography may be more helpful for further evaluation. Aortic Atherosclerosis (ICD10-I70.0). Electronically Signed   By: Fidela Salisbury MD   On: 09/02/2020 00:19   DG Chest Port 1 View  Result Date: 09/01/2020 CLINICAL DATA:  Abdominal pain and tachycardia EXAM: PORTABLE CHEST 1 VIEW  COMPARISON:  07/07/2020 FINDINGS: Cardiac shadow is stable. Aortic calcifications are seen. The lungs are hypoinflated but clear. No bony abnormality is noted. IMPRESSION: No active disease. Electronically Signed   By: Inez Catalina M.D.   On: 09/01/2020 23:11    ROS:  As stated above in the HPI otherwise negative.  Blood pressure (!) 148/78, pulse (!) 103, temperature 98.5 F (36.9 C), temperature source Oral, resp. rate 16, height 5' (1.524 m), weight 71.4 kg, SpO2 98 %.    PE: Gen: NAD, Alert and Oriented HEENT:  [place]/AT, EOMI Neck: Supple, no LAD Lungs: CTA Bilaterally CV: RRR without M/G/R ABD: Soft, NTND, +BS Ext: No C/C/E  Assessment/Plan: 1) History of LA Grade D esophagitis. 2) Anemia.   The patient's HGB is not necessarily far off of her baseline and she is stable.  With further questioning she states that she was taking her pantoprazole after PO intake.  At this time there is no need to repeat the EGD as her management will not change.  Plan: 1) Take pantoprazole, at home,  30 minutes before breakfast and dinner.  She now knows how to take the medication. 2) Monitor HGB and transfuse as necessary. 3) Advance to a regular diet. 4) Follow up in the office in 1-2 weeks.  Sofi Bryars D 09/02/2020, 4:42 PM

## 2020-09-02 NOTE — Progress Notes (Addendum)
PROGRESS NOTE    Chloe Harding  JEH:[id number] DOB: February 12, 1946 DOA: 09/01/2020 PCP: [name], NP   Brief Narrative:  Per admitting MD: Chloe Harding is a 75 y.o. female, with PMH of chronic combined systolic and diastolic CHF (EF 37-85%, G1 DD, LV global hypokinesis by TTE 11/06/2019), hypertension, hyperlipidemia, rheumatoid arthritis, depression, dysphagia and history of esophageal stenosis s/p dilation 01/25/2020 and EGD on 07/09/20 that showed esophageal stricture and severe esophagitis, who presented to the ER on 09/01/2020 with nausea, concern for upper GI bleed  Patient states prior to initial presentation, she started having nausea, some abdominal pain.  She had several episodes of black vomit.  When nursing tried to drink she would vomit again.  Denies any black stool.  She is on chronic hydrocodone for pain but denies any NSAIDs.  She says she is not on any blood thinners.  Did take Pepto-Bismol prior.  She does have a history of esophageal stricture and esophagitis. Patient was hospitalized in February of this year dyspnea thought to be related to acute hypoxic respiratory failure due to aspiration and flash pulmonary edema/acute on chronic CHF.  During hospitalization she had an EGD that demonstrated proximal esophageal strictures and ulceration.  She had a stricture dilated.   Assessment & Plan:   Active Problems:   ANEMIA   Hypertension   History of esophageal stricture   Tachycardia   Acute upper GI bleed   Chronic pain   1.  Concern for upper GI bleed - Patient presented with nausea and multiple episodes of black emesis - Stool occult blood negative on admission - CT abdomen pelvis showed signs concerning for esophagitis -Patient has history of esophagitis, esophageal stricture with last EGD performed in February 2022 - Hemoglobin initially 10.5 but decreased to 8.5  - Continue Protonix gtt - Serial CBC monitoring - No transfusion warranted at this time, will  monitor closely - N.p.o. for now - Discussed with patient's GI MD who will see in consultation  2.  symptomatic Anemia - Pt with tachycardia and tachypnea - Reports dizziness and weakness with exertion - f/up Iron studies - No transfusion warranted at this time but will monitor closely with pts symptoms and declining hgb   3.  Chronic pain syndrome - Resumed home pain regimen - Avoid NSAIDs given likely GI bleed  4.  Essential hypertension - Blood pressure mildly elevated on admission but has improved - Hold Lasix given concern for GI bleed - ordered home med/BB  5.  Depression/anxiety -Resume Lexapro   6.  Restless leg syndrome -Resume home Requip   DVT prophylaxis: SCD/Compression stockings  Code Status: FULL    Code Status Orders  (From admission, onward)         Start     Ordered   09/02/20 0138  Full code  Continuous        09/02/20 0139        Code Status History    Date Active Date Inactive Code Status Order ID Comments User Context   07/07/2020 2236 07/10/2020 2205 Full Code [id number]  Lenore Cordia, MD ED   02/16/2020 1522 02/18/2020 2136 DNR [id number]  Mercy Riding, MD Inpatient   02/15/2020 1745 02/16/2020 1522 Full Code [id number]  Lequita Halt, MD ED   01/19/2020 0155 01/26/2020 1855 Full Code [id number]  Rise Patience, MD Inpatient   12/23/2019 2026 12/27/2019 1635 Full Code [id number]  Neena Rhymes, MD ED   11/06/2019 0000 11/09/2019 1902 Full  Code [id number]  Vianne Bulls, MD ED   01/15/2019 1537 01/18/2019 1831 Full Code [id number]  Hosie Poisson, MD Inpatient   05/18/2017 1148 05/21/2017 1513 Full Code [id number]  Samella Parr, NP ED   03/30/2015 1725 04/02/2015 1802 Full Code [id number]  Lavina Hamman, MD ED   Advance Care Planning Activity     Family Communication: None today, awaiting GI input Disposition Plan:   Pt will remian inpt for continued tx of gi bleed requiring iv ppi and further subspecialty evaluation.  Also continue to  onitor for declinig nhgb and possible transfusion Consults called: GI Admission status: Inpatient   Consultants:   AS ABOVE  Procedures:  CT Abdomen Pelvis W Contrast  Result Date: 09/02/2020 CLINICAL DATA:  Nausea, vomiting, acute nonlocalized abdominal pain EXAM: CT ABDOMEN AND PELVIS WITH CONTRAST TECHNIQUE: Multidetector CT imaging of the abdomen and pelvis was performed using the standard protocol following bolus administration of intravenous contrast. CONTRAST:  114mL OMNIPAQUE IOHEXOL 300 MG/ML  SOLN COMPARISON:  02/15/2020 FINDINGS: Lower chest: Mild right basilar atelectasis. Mild elevation of the right hemidiaphragm. The visualized lung bases are otherwise clear. Small hiatal hernia. There is circumferential thickening of the distal esophagus suggesting changes of esophagitis, possibly reflux esophagitis. Small fluid noted within the distal esophagus. Moderate coronary artery calcification largely within the left anterior descending coronary artery. Global cardiac size within normal limits. No pericardial effusion. Hepatobiliary: Status post cholecystectomy. Liver unremarkable. No intra or extrahepatic biliary ductal dilation. Pancreas: Atrophic, but otherwise unremarkable. Spleen: Subcentimeter hypodensity within the a inferior pole is nonspecific but may represent a tiny cyst or hemangioma in a patient without a history of malignancy. Adrenals/Urinary Tract: Adrenal glands are unremarkable. Kidneys are normal, without renal calculi, focal lesion, or hydronephrosis. Bladder is unremarkable. Stomach/Bowel: Moderate sigmoid diverticulosis. The stomach, small bowel, and large bowel are otherwise unremarkable. Appendix absent. No free intraperitoneal gas or fluid. Vascular/Lymphatic: Moderate aortoiliac atherosclerotic calcification. Particularly prominent atherosclerotic calcification noted at the origin of the renal arteries bilaterally. The degree of stenosis, however, is not well assessed on  this non arteriographic study. No aortic aneurysm. No pathologic adenopathy within the abdomen and pelvis. Reproductive: Uterus and bilateral adnexa are unremarkable. Other: No abdominal wall hernia.  Rectum unremarkable. Musculoskeletal: No acute bone abnormality. Degenerative changes are seen throughout the thoracolumbar spine. No lytic or blastic bone lesions are seen. IMPRESSION: No acute intra-abdominal pathology identified. No definite radiographic explanation for the patient's reported symptoms. Circumferential wall thickening involving the distal esophagus in keeping with changes of esophagitis, possibly reflux esophagitis given the associated small hiatal hernia and fluid noted within the distal esophageal lumen. Moderate coronary artery calcification Moderate sigmoid diverticulosis without superimposed inflammatory change. Peripheral vascular disease. Particularly prominent atherosclerotic calcification noted at the renal ostia noted. There is clinical evidence of hemodynamically significant renal artery stenosis, CT arteriography may be more helpful for further evaluation. Aortic Atherosclerosis (ICD10-I70.0). Electronically Signed   By: Fidela Salisbury MD   On: 09/02/2020 00:19   DG Chest Port 1 View  Result Date: 09/01/2020 CLINICAL DATA:  Abdominal pain and tachycardia EXAM: PORTABLE CHEST 1 VIEW COMPARISON:  07/07/2020 FINDINGS: Cardiac shadow is stable. Aortic calcifications are seen. The lungs are hypoinflated but clear. No bony abnormality is noted. IMPRESSION: No active disease. Electronically Signed   By: Inez Catalina M.D.   On: 09/01/2020 23:11     Antimicrobials:   NONE    Subjective: No acute events overnight Admitted with hematemesis   Objective: Vitals:  09/02/20 0238 09/02/20 0302 09/02/20 0303 09/02/20 0648  BP:   134/67 (!) 151/75  Pulse:   94 90  Resp:   20 20  Temp: 98.8 F (37.1 C)  98.5 F (36.9 C) 98 F (36.7 C)  TempSrc:   Oral   SpO2:   96% 96%   Weight:  71.4 kg    Height:  5' (1.524 m)      Intake/Output Summary (Last 24 hours) at 09/02/2020 1041 Last data filed at 09/02/2020 0300 Gross per 24 hour  Intake 938.42 ml  Output --  Net 938.42 ml   Filed Weights   09/02/20 0302  Weight: 71.4 kg    Examination:  General exam: Appears calm and comfortable  Respiratory system: Clear to auscultation. Respiratory effort normal. Cardiovascular system: S1 & S2 heard, RRR. No JVD, murmurs, rubs, gallops or clicks. No pedal edema. Gastrointestinal system: Abdomen is nondistended, soft and mildly ttp epigastric and ruq, no r/g. Central nervous system: Alert and oriented. No focal neurological deficits. Extremities: Symmetric 5 x 5 power.wwp Skin: No rashes, lesions or ulcers Psychiatry: Judgement and insight appear normal. Mood & affect appropriate.     Data Reviewed: I have personally reviewed following labs and imaging studies  CBC: Recent Labs  Lab 09/01/20 2109 09/02/20 0037  WBC 6.4 6.0  HGB 10.4* 8.5*  8.5*  HCT 32.2* 27.0*  26.8*  MCV 82.8 83.9  PLT 233 237   Basic Metabolic Panel: Recent Labs  Lab 09/01/20 2109 09/02/20 0853  NA 138 142  K 3.7 3.6  CL 103 110  CO2 25 25  GLUCOSE 137* 100*  BUN 21 16  CREATININE 0.85 0.74  CALCIUM 9.6 8.9  MG  --  1.6*  PHOS  --  3.7   GFR: Estimated Creatinine Clearance: 55.3 mL/min (by C-G formula based on SCr of 0.74 mg/dL). Liver Function Tests: Recent Labs  Lab 09/01/20 2109 09/02/20 0853  AST 15 13*  ALT 9 5  ALKPHOS 81 70  BILITOT 0.9 0.9  PROT 6.9 5.9*  ALBUMIN 3.7 3.2*   Recent Labs  Lab 09/01/20 2109  LIPASE 32   No results for input(s): AMMONIA in the last 168 hours. Coagulation Profile: No results for input(s): INR, PROTIME in the last 168 hours. Cardiac Enzymes: No results for input(s): CKTOTAL, CKMB, CKMBINDEX, TROPONINI in the last 168 hours. BNP (last 3 results) No results for input(s): PROBNP in the last 8760 hours. HbA1C: No  results for input(s): HGBA1C in the last 72 hours. CBG: No results for input(s): GLUCAP in the last 168 hours. Lipid Profile: No results for input(s): CHOL, HDL, LDLCALC, TRIG, CHOLHDL, LDLDIRECT in the last 72 hours. Thyroid Function Tests: No results for input(s): TSH, T4TOTAL, FREET4, T3FREE, THYROIDAB in the last 72 hours. Anemia Panel: Recent Labs    09/02/20 0853  FERRITIN 16  TIBC 338  IRON 31   Sepsis Labs: No results for input(s): PROCALCITON, LATICACIDVEN in the last 168 hours.  Recent Results (from the past 240 hour(s))  MRSA PCR Screening     Status: None   Collection Time: 09/02/20  3:37 AM   Specimen: Nasopharyngeal  Result Value Ref Range Status   MRSA by PCR NEGATIVE NEGATIVE Final    Comment:        The GeneXpert MRSA Assay (FDA approved for NASAL specimens only), is one component of a comprehensive MRSA colonization surveillance program. It is not intended to diagnose MRSA infection nor to guide or monitor treatment for  MRSA infections. Performed at [facility], [place] [address] [place], [place] [address]          Radiology Studies: CT Abdomen Pelvis W Contrast  Result Date: 09/02/2020 CLINICAL DATA:  Nausea, vomiting, acute nonlocalized abdominal pain EXAM: CT ABDOMEN AND PELVIS WITH CONTRAST TECHNIQUE: Multidetector CT imaging of the abdomen and pelvis was performed using the standard protocol following bolus administration of intravenous contrast. CONTRAST:  167mL OMNIPAQUE IOHEXOL 300 MG/ML  SOLN COMPARISON:  02/15/2020 FINDINGS: Lower chest: Mild right basilar atelectasis. Mild elevation of the right hemidiaphragm. The visualized lung bases are otherwise clear. Small hiatal hernia. There is circumferential thickening of the distal esophagus suggesting changes of esophagitis, possibly reflux esophagitis. Small fluid noted within the distal esophagus. Moderate coronary artery calcification largely within the left anterior descending  coronary artery. Global cardiac size within normal limits. No pericardial effusion. Hepatobiliary: Status post cholecystectomy. Liver unremarkable. No intra or extrahepatic biliary ductal dilation. Pancreas: Atrophic, but otherwise unremarkable. Spleen: Subcentimeter hypodensity within the a inferior pole is nonspecific but may represent a tiny cyst or hemangioma in a patient without a history of malignancy. Adrenals/Urinary Tract: Adrenal glands are unremarkable. Kidneys are normal, without renal calculi, focal lesion, or hydronephrosis. Bladder is unremarkable. Stomach/Bowel: Moderate sigmoid diverticulosis. The stomach, small bowel, and large bowel are otherwise unremarkable. Appendix absent. No free intraperitoneal gas or fluid. Vascular/Lymphatic: Moderate aortoiliac atherosclerotic calcification. Particularly prominent atherosclerotic calcification noted at the origin of the renal arteries bilaterally. The degree of stenosis, however, is not well assessed on this non arteriographic study. No aortic aneurysm. No pathologic adenopathy within the abdomen and pelvis. Reproductive: Uterus and bilateral adnexa are unremarkable. Other: No abdominal wall hernia.  Rectum unremarkable. Musculoskeletal: No acute bone abnormality. Degenerative changes are seen throughout the thoracolumbar spine. No lytic or blastic bone lesions are seen. IMPRESSION: No acute intra-abdominal pathology identified. No definite radiographic explanation for the patient's reported symptoms. Circumferential wall thickening involving the distal esophagus in keeping with changes of esophagitis, possibly reflux esophagitis given the associated small hiatal hernia and fluid noted within the distal esophageal lumen. Moderate coronary artery calcification Moderate sigmoid diverticulosis without superimposed inflammatory change. Peripheral vascular disease. Particularly prominent atherosclerotic calcification noted at the renal ostia noted. There is  clinical evidence of hemodynamically significant renal artery stenosis, CT arteriography may be more helpful for further evaluation. Aortic Atherosclerosis (ICD10-I70.0). Electronically Signed   By: Fidela Salisbury MD   On: 09/02/2020 00:19   DG Chest Port 1 View  Result Date: 09/01/2020 CLINICAL DATA:  Abdominal pain and tachycardia EXAM: PORTABLE CHEST 1 VIEW COMPARISON:  07/07/2020 FINDINGS: Cardiac shadow is stable. Aortic calcifications are seen. The lungs are hypoinflated but clear. No bony abnormality is noted. IMPRESSION: No active disease. Electronically Signed   By: Inez Catalina M.D.   On: 09/01/2020 23:11        Scheduled Meds: . [START ON 09/05/2020] pantoprazole  40 mg Intravenous Q12H   Continuous Infusions: . sodium chloride 75 mL/hr at 09/01/20 2303  . pantoprozole (PROTONIX) infusion 8 mg/hr (09/01/20 2247)     LOS: 0 days    Time spent: 57 min    Nicolette Bang, MD [facility]  If 7PM-7AM, please contact night-coverage  09/02/2020, 10:41 AM

## 2020-09-02 NOTE — ED Provider Notes (Signed)
Nursing notes and vitals signs, including pulse oximetry, reviewed.  Summary of this visit's results, reviewed by myself:  EKG:  EKG Interpretation  Date/Time:  Sunday September 01 2020 21:50:17 EDT Ventricular Rate:  129 PR Interval:  137 QRS Duration: 79 QT Interval:  317 QTC Calculation: 465 R Axis:   22 Text Interpretation: Sinus tachycardia Borderline low voltage, extremity leads Baseline wander in lead(s) II III aVF Confirmed by Zackowski, Scott ([id number]) on 09/01/2020 10:36:52 PM       Labs:  Results for orders placed or performed during the hospital encounter of 09/01/20 (from the past 24 hour(s))  Lipase, blood     Status: None   Collection Time: 09/01/20  9:09 PM  Result Value Ref Range   Lipase 32 11 - 51 U/L  Comprehensive metabolic panel     Status: Abnormal   Collection Time: 09/01/20  9:09 PM  Result Value Ref Range   Sodium 138 135 - 145 mmol/L   Potassium 3.7 3.5 - 5.1 mmol/L   Chloride 103 98 - 111 mmol/L   CO2 25 22 - 32 mmol/L   Glucose, Bld 137 (H) 70 - 99 mg/dL   BUN 21 8 - 23 mg/dL   Creatinine, Ser 0.85 0.44 - 1.00 mg/dL   Calcium 9.6 8.9 - 10.3 mg/dL   Total Protein 6.9 6.5 - 8.1 g/dL   Albumin 3.7 3.5 - 5.0 g/dL   AST 15 15 - 41 U/L   ALT 9 0 - 44 U/L   Alkaline Phosphatase 81 38 - 126 U/L   Total Bilirubin 0.9 0.3 - 1.2 mg/dL   GFR, Estimated >60 >60 mL/min   Anion gap 10 5 - 15  CBC     Status: Abnormal   Collection Time: 09/01/20  9:09 PM  Result Value Ref Range   WBC 6.4 4.0 - 10.5 K/uL   RBC 3.89 3.87 - 5.11 MIL/uL   Hemoglobin 10.4 (L) 12.0 - 15.0 g/dL   HCT 32.2 (L) 36.0 - 46.0 %   MCV 82.8 80.0 - 100.0 fL   MCH 26.7 26.0 - 34.0 pg   MCHC 32.3 30.0 - 36.0 g/dL   RDW 15.0 11.5 - 15.5 %   Platelets 233 150 - 400 K/uL   nRBC 0.0 0.0 - 0.2 %  Type and screen [facility]     Status: None   Collection Time: 09/01/20  9:45 PM  Result Value Ref Range   ABO/RH(D) O POS    Antibody Screen NEG    Sample Expiration       04 /20/2022,2359 Performed at [facility], [place] [address] [place], [place] [address]   POC occult blood, ED RN will collect     Status: None   Collection Time: 09/01/20 10:33 PM  Result Value Ref Range   Fecal Occult Bld NEGATIVE NEGATIVE  Urinalysis, Routine w reflex microscopic Urine, Clean Catch     Status: Abnormal   Collection Time: 09/01/20 10:49 PM  Result Value Ref Range   Color, Urine STRAW (A) YELLOW   APPearance CLEAR CLEAR   Specific Gravity, Urine 1.009 1.005 - 1.030   pH 7.0 5.0 - 8.0   Glucose, UA NEGATIVE NEGATIVE mg/dL   Hgb urine dipstick SMALL (A) NEGATIVE   Bilirubin Urine NEGATIVE NEGATIVE   Ketones, ur NEGATIVE NEGATIVE mg/dL   Protein, ur 100 (A) NEGATIVE mg/dL   Nitrite NEGATIVE NEGATIVE   Leukocytes,Ua NEGATIVE NEGATIVE   RBC / HPF 0-5 0 -  5 RBC/hpf   WBC, UA 0-5 0 - 5 WBC/hpf   Bacteria, UA NONE SEEN NONE SEEN  Hemoglobin and hematocrit, blood     Status: Abnormal   Collection Time: 09/02/20 12:37 AM  Result Value Ref Range   Hemoglobin 8.5 (L) 12.0 - 15.0 g/dL   HCT 26.8 (L) 36.0 - 46.0 %    Imaging Studies: CT Abdomen Pelvis W Contrast  Result Date: 09/02/2020 CLINICAL DATA:  Nausea, vomiting, acute nonlocalized abdominal pain EXAM: CT ABDOMEN AND PELVIS WITH CONTRAST TECHNIQUE: Multidetector CT imaging of the abdomen and pelvis was performed using the standard protocol following bolus administration of intravenous contrast. CONTRAST:  17mL OMNIPAQUE IOHEXOL 300 MG/ML  SOLN COMPARISON:  02/15/2020 FINDINGS: Lower chest: Mild right basilar atelectasis. Mild elevation of the right hemidiaphragm. The visualized lung bases are otherwise clear. Small hiatal hernia. There is circumferential thickening of the distal esophagus suggesting changes of esophagitis, possibly reflux esophagitis. Small fluid noted within the distal esophagus. Moderate coronary artery calcification largely within the left anterior descending coronary artery. Global  cardiac size within normal limits. No pericardial effusion. Hepatobiliary: Status post cholecystectomy. Liver unremarkable. No intra or extrahepatic biliary ductal dilation. Pancreas: Atrophic, but otherwise unremarkable. Spleen: Subcentimeter hypodensity within the a inferior pole is nonspecific but may represent a tiny cyst or hemangioma in a patient without a history of malignancy. Adrenals/Urinary Tract: Adrenal glands are unremarkable. Kidneys are normal, without renal calculi, focal lesion, or hydronephrosis. Bladder is unremarkable. Stomach/Bowel: Moderate sigmoid diverticulosis. The stomach, small bowel, and large bowel are otherwise unremarkable. Appendix absent. No free intraperitoneal gas or fluid. Vascular/Lymphatic: Moderate aortoiliac atherosclerotic calcification. Particularly prominent atherosclerotic calcification noted at the origin of the renal arteries bilaterally. The degree of stenosis, however, is not well assessed on this non arteriographic study. No aortic aneurysm. No pathologic adenopathy within the abdomen and pelvis. Reproductive: Uterus and bilateral adnexa are unremarkable. Other: No abdominal wall hernia.  Rectum unremarkable. Musculoskeletal: No acute bone abnormality. Degenerative changes are seen throughout the thoracolumbar spine. No lytic or blastic bone lesions are seen. IMPRESSION: No acute intra-abdominal pathology identified. No definite radiographic explanation for the patient's reported symptoms. Circumferential wall thickening involving the distal esophagus in keeping with changes of esophagitis, possibly reflux esophagitis given the associated small hiatal hernia and fluid noted within the distal esophageal lumen. Moderate coronary artery calcification Moderate sigmoid diverticulosis without superimposed inflammatory change. Peripheral vascular disease. Particularly prominent atherosclerotic calcification noted at the renal ostia noted. There is clinical evidence of  hemodynamically significant renal artery stenosis, CT arteriography may be more helpful for further evaluation. Aortic Atherosclerosis (ICD10-I70.0). Electronically Signed   By: Fidela Salisbury MD   On: 09/02/2020 00:19   DG Chest Port 1 View  Result Date: 09/01/2020 CLINICAL DATA:  Abdominal pain and tachycardia EXAM: PORTABLE CHEST 1 VIEW COMPARISON:  07/07/2020 FINDINGS: Cardiac shadow is stable. Aortic calcifications are seen. The lungs are hypoinflated but clear. No bony abnormality is noted. IMPRESSION: No active disease. Electronically Signed   By: Inez Catalina M.D.   On: 09/01/2020 23:11   1:27 AM Patient's hemoglobin has dropped significantly after hydration.  This is suggestive of an acute GI blood loss.  Even though the patient's stool was heme-negative she may not have had enough time for blood to pass from the stomach to the lower GI tract especially if she has been profusely vomiting.  Dr. Luna Fuse to admit to hospitalist service.   Kinsleigh Ludolph, Jenny Reichmann, MD 09/02/20 [phone]

## 2020-09-03 DIAGNOSIS — F32A Depression, unspecified: Secondary | ICD-10-CM

## 2020-09-03 DIAGNOSIS — F419 Anxiety disorder, unspecified: Secondary | ICD-10-CM

## 2020-09-03 DIAGNOSIS — Z8719 Personal history of other diseases of the digestive system: Secondary | ICD-10-CM

## 2020-09-03 DIAGNOSIS — G2581 Restless legs syndrome: Secondary | ICD-10-CM

## 2020-09-03 DIAGNOSIS — R112 Nausea with vomiting, unspecified: Secondary | ICD-10-CM | POA: Diagnosis not present

## 2020-09-03 LAB — CBC
HCT: 30.4 % — ABNORMAL LOW (ref 36.0–46.0)
Hemoglobin: 9.4 g/dL — ABNORMAL LOW (ref 12.0–15.0)
MCH: 26.3 pg (ref 26.0–34.0)
MCHC: 30.9 g/dL (ref 30.0–36.0)
MCV: 85.2 fL (ref 80.0–100.0)
Platelets: 197 10*3/uL (ref 150–400)
RBC: 3.57 MIL/uL — ABNORMAL LOW (ref 3.87–5.11)
RDW: 14.7 % (ref 11.5–15.5)
WBC: 5.5 10*3/uL (ref 4.0–10.5)
nRBC: 0 % (ref 0.0–0.2)

## 2020-09-03 LAB — BASIC METABOLIC PANEL
Anion gap: 11 (ref 5–15)
BUN: 8 mg/dL (ref 8–23)
CO2: 21 mmol/L — ABNORMAL LOW (ref 22–32)
Calcium: 9.2 mg/dL (ref 8.9–10.3)
Chloride: 107 mmol/L (ref 98–111)
Creatinine, Ser: 0.7 mg/dL (ref 0.44–1.00)
GFR, Estimated: >60 mL/min (ref >60)
Glucose, Bld: 104 mg/dL — ABNORMAL HIGH (ref 70–99)
Potassium: 3.6 mmol/L (ref 3.5–5.1)
Sodium: 139 mmol/L (ref 135–145)

## 2020-09-03 LAB — MAGNESIUM: Magnesium: 1.6 mg/dL — ABNORMAL LOW (ref 1.7–2.4)

## 2020-09-03 MED ORDER — PANTOPRAZOLE SODIUM 40 MG PO TBEC
40.0000 mg | DELAYED_RELEASE_TABLET | Freq: Two times a day (BID) | ORAL | Status: DC
  Administered 2020-09-03: 40 mg via ORAL
  Filled 2020-09-03: qty 1

## 2020-09-03 MED ORDER — LISINOPRIL 20 MG PO TABS: 20.0000 mg | ORAL_TABLET | Freq: Every day | ORAL | 2 refills | Status: DC

## 2020-09-03 MED ORDER — HYDRALAZINE HCL 25 MG PO TABS
25.0000 mg | ORAL_TABLET | Freq: Four times a day (QID) | ORAL | Status: DC | PRN
  Administered 2020-09-03: 25 mg via ORAL
  Filled 2020-09-03: qty 1

## 2020-09-03 MED ORDER — MAGNESIUM SULFATE 2 GM/50ML IV SOLN
2.0000 g | Freq: Once | INTRAVENOUS | Status: AC
  Administered 2020-09-03: 2 g via INTRAVENOUS
  Filled 2020-09-03: qty 50

## 2020-09-03 MED ORDER — HYDRALAZINE HCL 10 MG PO TABS: 10.0000 mg | ORAL_TABLET | Freq: Three times a day (TID) | ORAL | 0 refills | Status: DC

## 2020-09-03 NOTE — Care Management Obs Status (Signed)
MEDICARE OBSERVATION STATUS NOTIFICATION   Patient Details  Name: KEYIA MORETTO MRN: [id number] Date of Birth: 02-05-1946   Medicare Observation Status Notification Given:  Yes    MahabirJuliann Pulse, RN 09/03/2020, 1:54 PM

## 2020-09-03 NOTE — Care Management CC44 (Signed)
Condition Code 44 Documentation Completed  Patient Details  Name: YERALDI FIDLER MRN: [id number] Date of Birth: August 21, 1945   Condition Code 44 given:  Yes Patient signature on Condition Code 44 notice:  Yes Documentation of 2 MD's agreement:  Yes Code 44 added to claim:  Yes    Dessa Phi, RN 09/03/2020, 1:54 PM

## 2020-09-03 NOTE — Discharge Summary (Signed)
Physician Discharge Summary  Chloe Harding NGE:[id number] DOB: 1945/10/18 DOA: 09/01/2020  PCP: Chloe Huh, NP  Admit date: 09/01/2020 Discharge date: 09/03/2020  Admitted From: Home Disposition:  Home  Recommendations for Outpatient Follow-up:  1. Follow up with PCP in 1 week 2. Follow up with Dr. Benson Harding, GI, in 1 to 2 weeks 3. Please follow up on incidental finding on CT A/P "clinical evidence of hemodynamically significant renal artery stenosis, CT arteriography may be more helpful for further evaluation."   Discharge Condition: Stable CODE STATUS: Full code Diet recommendation: Regular  Brief/Interim Summary: From H&P by Dr. Luna Harding: "Chloe Harding is a 75 y.o. female, with PMH of chronic combined systolic and diastolic CHF (EF 40-10%, G1 DD, LV global hypokinesis by TTE 11/06/2019), hypertension, hyperlipidemia, rheumatoid arthritis, depression, dysphagia and history of esophageal stenosis s/p dilation 01/25/2020 and EGD on 07/09/20 that showed esophageal stricture and severe esophagitis, who presented to the ER on 09/01/2020 with nausea, concern for upper GI bleed  Patient states prior to initial presentation, she started having nausea, some abdominal pain.  She had several episodes of black vomit.  When nursing tried to drink she would vomit again.  Denies any black stool.  She is on chronic hydrocodone for pain but denies any NSAIDs.  She says she is not on any blood thinners.  Did take Pepto-Bismol prior.  She does have a history of esophageal stricture and esophagitis. Patient was hospitalized in February of this year dyspnea thought to be related to acute hypoxic respiratory failure due to aspiration and flash pulmonary edema/acute on chronic CHF.  During hospitalization she had an EGD that demonstrated proximal esophageal strictures and ulceration.  She had a stricture dilated.  ED course: -Vitals on admission: Afebrile, heart rate 133, blood pressure 183/106, maintaining sats on  room air -Labs on initial presentation: Sodium 138, potassium 3.7, chloride 103, bicarb 25, glucose 137, BUN 21, creatinine 0.85, WBC 6.4, hemoglobin initially 10.4 but decreased to 8.5 after IV fluids -Imaging obtained on admission: Chest x-ray unremarkable.  CT abdomen pelvis with contrast showed findings possibly of esophagitis -In the ED the patient was given IV fluids, Protonix drip, Zofran, metoprolol, Dilaudid, and the hospitalist service was contacted for further evaluation and management."  GI was consulted.  Patient's hemoglobin remained stable.  FOBT was negative. GI did not feel that patient needed another EGD.  Patient's diet was advanced to regular diet and was resumed on PPI.  Discharge Diagnoses:  Principal Problem:   Nausea & vomiting Active Problems:   Chronic anemia   Hypertension   History of esophageal stricture   Tachycardia   Hypomagnesemia   Chronic pain   Depression   Anxiety   RLS (restless legs syndrome)   History of esophagitis   Discharge Instructions  Discharge Instructions    (HEART FAILURE PATIENTS) Call MD:  Anytime you have any of the following symptoms: 1) 3 pound weight gain in 24 hours or 5 pounds in 1 week 2) shortness of breath, with or without a dry hacking cough 3) swelling in the hands, feet or stomach 4) if you have to sleep on extra pillows at night in order to breathe.   Complete by: As directed    Call MD for:  difficulty breathing, headache or visual disturbances   Complete by: As directed    Call MD for:  extreme fatigue   Complete by: As directed    Call MD for:  persistant dizziness or light-headedness   Complete by:  As directed    Call MD for:  persistant nausea and vomiting   Complete by: As directed    Call MD for:  severe uncontrolled pain   Complete by: As directed    Call MD for:  temperature >100.4   Complete by: As directed    Discharge instructions   Complete by: As directed    You were cared for by a hospitalist  during your hospital stay. If you have any questions about your discharge medications or the care you received while you were in the hospital after you are discharged, you can call the unit and ask to speak with the hospitalist on call if the hospitalist that took care of you is not available. Once you are discharged, your primary care physician will handle any further medical issues. Please note that NO REFILLS for any discharge medications will be authorized once you are discharged, as it is imperative that you return to your primary care physician (or establish a relationship with a primary care physician if you do not have one) for your aftercare needs so that they can reassess your need for medications and monitor your lab values.   Increase activity slowly   Complete by: As directed      Allergies as of 09/03/2020      Reactions   Amlodipine Besylate Swelling      Medication List    TAKE these medications   albuterol 108 (90 Base) MCG/ACT inhaler Commonly known as: VENTOLIN HFA Inhale 1-2 puffs into the lungs every 4 (four) hours as needed for wheezing or shortness of breath.   atorvastatin 20 MG tablet Commonly known as: LIPITOR Take 1 tablet (20 mg total) by mouth daily.   cyclobenzaprine 5 MG tablet Commonly known as: FLEXERIL Take 5 mg by mouth at bedtime.   escitalopram 20 MG tablet Commonly known as: LEXAPRO Take 20 mg by mouth daily.   folic acid 1 MG tablet Commonly known as: FOLVITE Take 1 tablet (1 mg total) by mouth daily.   furosemide 20 MG tablet Commonly known as: LASIX Take 20 mg by mouth daily as needed for fluid.   HYDROcodone-acetaminophen 5-325 MG tablet Commonly known as: NORCO/VICODIN Take 1 tablet by mouth 4 (four) times daily as needed for moderate pain.   lactulose 10 GM/15ML solution Commonly known as: CHRONULAC Take 10-20 g by mouth daily as needed for mild constipation.   lisinopril 20 MG tablet Commonly known as: ZESTRIL Take 1 tablet  (20 mg total) by mouth daily.   melatonin 5 MG Tabs Take 5 mg by mouth at bedtime.   metoprolol succinate 25 MG 24 hr tablet Commonly known as: TOPROL-XL Take 25 mg by mouth daily.   pantoprazole 40 MG tablet Commonly known as: Protonix Take 1 tablet (40 mg total) by mouth 2 (two) times daily.   potassium chloride 10 MEQ tablet Commonly known as: KLOR-CON Take 10 mEq by mouth daily.   rOPINIRole 2 MG tablet Commonly known as: REQUIP Take 2 mg by mouth at bedtime.       Follow-up Information    Carol Ada, MD. Schedule an appointment as soon as possible for a visit in 1 week(s).   Specialty: Gastroenterology Contact information: [place], [place] [address] [phone]        Chloe Huh, NP. Schedule an appointment as soon as possible for a visit in 1 week(s).   Specialty: Nurse Practitioner Contact information: [place] [address] [phone]  Allergies  Allergen Reactions  . Amlodipine Besylate Swelling    Consultations:  GI   Procedures/Studies: CT Abdomen Pelvis W Contrast  Result Date: 09/02/2020 CLINICAL DATA:  Nausea, vomiting, acute nonlocalized abdominal pain EXAM: CT ABDOMEN AND PELVIS WITH CONTRAST TECHNIQUE: Multidetector CT imaging of the abdomen and pelvis was performed using the standard protocol following bolus administration of intravenous contrast. CONTRAST:  125mL OMNIPAQUE IOHEXOL 300 MG/ML  SOLN COMPARISON:  02/15/2020 FINDINGS: Lower chest: Mild right basilar atelectasis. Mild elevation of the right hemidiaphragm. The visualized lung bases are otherwise clear. Small hiatal hernia. There is circumferential thickening of the distal esophagus suggesting changes of esophagitis, possibly reflux esophagitis. Small fluid noted within the distal esophagus. Moderate coronary artery calcification largely within the left anterior descending coronary artery. Global cardiac size within  normal limits. No pericardial effusion. Hepatobiliary: Status post cholecystectomy. Liver unremarkable. No intra or extrahepatic biliary ductal dilation. Pancreas: Atrophic, but otherwise unremarkable. Spleen: Subcentimeter hypodensity within the a inferior pole is nonspecific but may represent a tiny cyst or hemangioma in a patient without a history of malignancy. Adrenals/Urinary Tract: Adrenal glands are unremarkable. Kidneys are normal, without renal calculi, focal lesion, or hydronephrosis. Bladder is unremarkable. Stomach/Bowel: Moderate sigmoid diverticulosis. The stomach, small bowel, and large bowel are otherwise unremarkable. Appendix absent. No free intraperitoneal gas or fluid. Vascular/Lymphatic: Moderate aortoiliac atherosclerotic calcification. Particularly prominent atherosclerotic calcification noted at the origin of the renal arteries bilaterally. The degree of stenosis, however, is not well assessed on this non arteriographic study. No aortic aneurysm. No pathologic adenopathy within the abdomen and pelvis. Reproductive: Uterus and bilateral adnexa are unremarkable. Other: No abdominal wall hernia.  Rectum unremarkable. Musculoskeletal: No acute bone abnormality. Degenerative changes are seen throughout the thoracolumbar spine. No lytic or blastic bone lesions are seen. IMPRESSION: No acute intra-abdominal pathology identified. No definite radiographic explanation for the patient's reported symptoms. Circumferential wall thickening involving the distal esophagus in keeping with changes of esophagitis, possibly reflux esophagitis given the associated small hiatal hernia and fluid noted within the distal esophageal lumen. Moderate coronary artery calcification Moderate sigmoid diverticulosis without superimposed inflammatory change. Peripheral vascular disease. Particularly prominent atherosclerotic calcification noted at the renal ostia noted. There is clinical evidence of hemodynamically  significant renal artery stenosis, CT arteriography may be more helpful for further evaluation. Aortic Atherosclerosis (ICD10-I70.0). Electronically Signed   By: Fidela Salisbury MD   On: 09/02/2020 00:19   DG Chest Port 1 View  Result Date: 09/01/2020 CLINICAL DATA:  Abdominal pain and tachycardia EXAM: PORTABLE CHEST 1 VIEW COMPARISON:  07/07/2020 FINDINGS: Cardiac shadow is stable. Aortic calcifications are seen. The lungs are hypoinflated but clear. No bony abnormality is noted. IMPRESSION: No active disease. Electronically Signed   By: Inez Catalina M.D.   On: 09/01/2020 23:11       Discharge Exam: Vitals:   09/03/20 0608 09/03/20 0753  BP: (!) 197/92 (!) 194/103  Pulse: 95   Resp: 18 19  Temp: 98 F (36.7 C)   SpO2: 95%     General: Pt is alert, awake, not in acute distress Cardiovascular: RRR, S1/S2 +, no edema Respiratory: CTA bilaterally, no wheezing, no rhonchi, no respiratory distress, no conversational dyspnea  Abdominal: Soft, NT, ND, bowel sounds + Extremities: no edema, no cyanosis Psych: Normal mood and affect, stable judgement and insight     The results of significant diagnostics from this hospitalization (including imaging, microbiology, ancillary and laboratory) are listed below for reference.     Microbiology: Recent Results (  from the past 240 hour(s))  SARS CORONAVIRUS 2 (TAT 6-24 HRS) Nasopharyngeal Nasopharyngeal Swab     Status: None   Collection Time: 09/02/20  1:05 AM   Specimen: Nasopharyngeal Swab  Result Value Ref Range Status   SARS Coronavirus 2 NEGATIVE NEGATIVE Final    Comment: (NOTE) SARS-CoV-2 target nucleic acids are NOT DETECTED.  The SARS-CoV-2 RNA is generally detectable in upper and lower respiratory specimens during the acute phase of infection. Negative results do not preclude SARS-CoV-2 infection, do not rule out co-infections with other pathogens, and should not be used as the sole basis for treatment or other patient management  decisions. Negative results must be combined with clinical observations, patient history, and epidemiological information. The expected result is Negative.  Fact Sheet for Patients: SugarRoll.be  Fact Sheet for Healthcare Providers: https://www.woods-mathews.com/  This test is not yet approved or cleared by the [place] FDA and  has been authorized for detection and/or diagnosis of SARS-CoV-2 by FDA under an Emergency Use Authorization (EUA). This EUA will remain  in effect (meaning this test can be used) for the duration of the COVID-19 declaration under Se ction 564(b)(1) of the Act, 21 U.S.C. section 360bbb-3(b)(1), unless the authorization is terminated or revoked sooner.  Performed at [facility], [place] [address] [place], [place] [address]   MRSA PCR Screening     Status: None   Collection Time: 09/02/20  3:37 AM   Specimen: Nasopharyngeal  Result Value Ref Range Status   MRSA by PCR NEGATIVE NEGATIVE Final    Comment:        The GeneXpert MRSA Assay (FDA approved for NASAL specimens only), is one component of a comprehensive MRSA colonization surveillance program. It is not intended to diagnose MRSA infection nor to guide or monitor treatment for MRSA infections. Performed at [facility], [place] [address], [place], [place] [address]      Labs: BNP (last 3 results) Recent Labs    11/09/19 0337 01/18/20 2032 07/07/20 1930  BNP 2,255.4* 439.5* 836.6*   Basic Metabolic Panel: Recent Labs  Lab 09/01/20 2109 09/02/20 0853 09/03/20 1038  NA 138 142 139  K 3.7 3.6 3.6  CL 103 110 107  CO2 25 25 21*  GLUCOSE 137* 100* 104*  BUN 21 16 8   CREATININE 0.85 0.74 0.70  CALCIUM 9.6 8.9 9.2  MG  --  1.6* 1.6*  PHOS  --  3.7  --    Liver Function Tests: Recent Labs  Lab 09/01/20 2109 09/02/20 0853  AST 15 13*  ALT 9 5  ALKPHOS 81 70  BILITOT 0.9 0.9  PROT 6.9 5.9*  ALBUMIN 3.7 3.2*    Recent Labs  Lab 09/01/20 2109  LIPASE 32   No results for input(s): AMMONIA in the last 168 hours. CBC: Recent Labs  Lab 09/01/20 2109 09/02/20 0037 09/02/20 1407 09/02/20 1920 09/03/20 1038  WBC 6.4 6.0 4.0 4.0 5.5  HGB 10.4* 8.5*  8.5* 8.4* 8.6* 9.4*  HCT 32.2* 27.0*  26.8* 27.1* 27.5* 30.4*  MCV 82.8 83.9 85.0 84.6 85.2  PLT 233 191 183 189 197   Cardiac Enzymes: No results for input(s): CKTOTAL, CKMB, CKMBINDEX, TROPONINI in the last 168 hours. BNP: Invalid input(s): POCBNP CBG: Recent Labs  Lab 09/02/20 2031  GLUCAP 167*   D-Dimer No results for input(s): DDIMER in the last 72 hours. Hgb A1c No results for input(s): HGBA1C in the last 72 hours. Lipid Profile No results for input(s):  CHOL, HDL, LDLCALC, TRIG, CHOLHDL, LDLDIRECT in the last 72 hours. Thyroid function studies No results for input(s): TSH, T4TOTAL, T3FREE, THYROIDAB in the last 72 hours.  Invalid input(s): FREET3 Anemia work up Recent Labs    09/02/20 0853  FERRITIN 16  TIBC 338  IRON 31   Urinalysis    Component Value Date/Time   COLORURINE STRAW (A) 09/01/2020 2249   APPEARANCEUR CLEAR 09/01/2020 2249   APPEARANCEUR Clear 02/23/2020 1104   LABSPEC 1.009 09/01/2020 2249   PHURINE 7.0 09/01/2020 2249   GLUCOSEU NEGATIVE 09/01/2020 2249   HGBUR SMALL (A) 09/01/2020 2249   HGBUR trace-intact 05/14/2010 1402   BILIRUBINUR NEGATIVE 09/01/2020 2249   BILIRUBINUR Negative 02/23/2020 1104   KETONESUR NEGATIVE 09/01/2020 2249   PROTEINUR 100 (A) 09/01/2020 2249   UROBILINOGEN 1.0 03/30/2015 1540   NITRITE NEGATIVE 09/01/2020 2249   LEUKOCYTESUR NEGATIVE 09/01/2020 2249   Sepsis Labs Invalid input(s): PROCALCITONIN,  WBC,  LACTICIDVEN Microbiology Recent Results (from the past 240 hour(s))  SARS CORONAVIRUS 2 (TAT 6-24 HRS) Nasopharyngeal Nasopharyngeal Swab     Status: None   Collection Time: 09/02/20  1:05 AM   Specimen: Nasopharyngeal Swab  Result Value Ref Range Status    SARS Coronavirus 2 NEGATIVE NEGATIVE Final    Comment: (NOTE) SARS-CoV-2 target nucleic acids are NOT DETECTED.  The SARS-CoV-2 RNA is generally detectable in upper and lower respiratory specimens during the acute phase of infection. Negative results do not preclude SARS-CoV-2 infection, do not rule out co-infections with other pathogens, and should not be used as the sole basis for treatment or other patient management decisions. Negative results must be combined with clinical observations, patient history, and epidemiological information. The expected result is Negative.  Fact Sheet for Patients: SugarRoll.be  Fact Sheet for Healthcare Providers: https://www.woods-mathews.com/  This test is not yet approved or cleared by the [place] FDA and  has been authorized for detection and/or diagnosis of SARS-CoV-2 by FDA under an Emergency Use Authorization (EUA). This EUA will remain  in effect (meaning this test can be used) for the duration of the COVID-19 declaration under Se ction 564(b)(1) of the Act, 21 U.S.C. section 360bbb-3(b)(1), unless the authorization is terminated or revoked sooner.  Performed at [facility], [place] [address], [place], [place] [address]   MRSA PCR Screening     Status: None   Collection Time: 09/02/20  3:37 AM   Specimen: Nasopharyngeal  Result Value Ref Range Status   MRSA by PCR NEGATIVE NEGATIVE Final    Comment:        The GeneXpert MRSA Assay (FDA approved for NASAL specimens only), is one component of a comprehensive MRSA colonization surveillance program. It is not intended to diagnose MRSA infection nor to guide or monitor treatment for MRSA infections. Performed at [facility], [place] [address], [place], [place] [address]      Patient was seen and examined on the day of discharge and was found to be in stable condition. Time coordinating discharge: 35 minutes  including assessment and coordination of care, as well as examination of the patient.   SIGNED:  Dessa Phi, DO [facility] 09/03/2020, 11:55 AM

## 2020-10-24 ENCOUNTER — Telehealth: Payer: Self-pay

## 2020-10-24 NOTE — Telephone Encounter (Signed)
TC placed to patient by Palliative care Volunteer, no answer

## 2021-02-11 ENCOUNTER — Telehealth: Payer: Self-pay | Admitting: *Deleted

## 2021-02-11 NOTE — Telephone Encounter (Signed)
Spoke with pt who is agreeable to see Dr. Oval Linsey on 9/29 at 11:40 am. Pt  thanked me for the call.  Appointment notes have been updated and will route to requesting surgeon's office via fax. I will remove from pre-op callback pool.

## 2021-02-11 NOTE — Telephone Encounter (Signed)
   Name: Chloe Harding  DOB: 10-Feb-1946  MRN: [id number]  Primary Cardiologist: [name], MD  Chart reviewed as part of pre-operative protocol coverage. Because of [name]'s past medical history and time since last visit, she will require a follow-up visit in order to better assess preoperative cardiovascular risk.  This patient was last seen by our team 06/2020 during a hospitalization, she has not been seen since that time.   Pre-op covering staff: - Please schedule appointment and call patient to inform them. If patient already had an upcoming appointment within acceptable timeframe, please add "pre-op clearance" to the appointment notes so provider is aware. - Please contact requesting surgeon's office via preferred method (i.e, phone, fax) to inform them of need for appointment prior to surgery.  If applicable, this message will also be routed to pharmacy pool and/or primary cardiologist for input on holding anticoagulant/antiplatelet agent as requested below so that this information is available to the clearing provider at time of patient's appointment.   Kathyrn Drown, NP  02/11/2021, 11:07 AM

## 2021-02-11 NOTE — Telephone Encounter (Signed)
   [facility] Pre-operative Risk Assessment    Patient Name: STEFANNY PIERI  DOB: 04/07/1946 MRN: [id number]  HEARTCARE STAFF:  - IMPORTANT!!!!!! Under Visit Info/Reason for Call, type in Other and utilize the format Clearance MM/DD/YY or Clearance TBD. Do not use dashes or single digits. - Please review there is not already an duplicate clearance open for this procedure. - If request is for dental extraction, please clarify the # of teeth to be extracted. - If the patient is currently at the dentist's office, call Pre-Op Callback Staff (MA/nurse) to input urgent request.  - If the patient is not currently in the dentist office, please route to the Pre-Op pool.  Request for surgical clearance:  What type of surgery is being performed? EGD  When is this surgery scheduled? 02/18/2021  What type of clearance is required (medical clearance vs. Pharmacy clearance to hold med vs. Both)? Medical  Are there any medications that need to be held prior to surgery and how long? None   Practice name and name of physician performing surgery? [facility], [place] [address] Beryle Beams  What is the office phone number? [phone]   7.   What is the office fax number? [phone]  8.   Anesthesia type (None, local, MAC, general) ? Propofol   Barbaraann Barthel 02/11/2021, 11:03 AM  _________________________________________________________________   (provider comments below)

## 2021-02-12 NOTE — Progress Notes (Signed)
Cardiology Office Note:    Date:  02/14/2021   ID:  SHADI LARNER, DOB [birth year], MRN [id number]  PCP:  [name], NP   [facility] Providers Cardiologist:  [name], MD      Referring MD: [name], NP   Follow-up for systolic and diastolic CHF, preoperative cardiac evaluation  History of Present Illness:    Chloe Harding is a 75 y.o. female with a hx of benign essential hypertension, hypotension, acute CHF, demand ischemia, CAD, community-acquired pneumonia, GERD, type 2 diabetes, stage I chronic kidney disease, dyslipidemia, hyperkalemia, shortness of breath, and depression.   She was admitted to the hospital on 11/05/2019 through 11/09/2019.  She presented to the emergency department with shortness of breath and cough.  Her CTA was negative for PE but concerning for pneumonia, atelectasis and fluid overload.  She had completed a course of amoxicillin.  Upon admission she was started on Rocephin and IV diuresis.  She received 1 unit of PRBC, her GI work-up was negative.  She did well with bronchodilators, IV ABX, and diuretics.  Her breathing improved slowly over the course of 3 days.  She was also diagnosed with iron deficiency anemia and prescribed iron supplement.  Cardiology was consulted due to elevated troponins.  This was felt to be related to demand ischemia.  Her echocardiogram showed an LVEF of 45-50% with global hypokinesis.  EKG showed no evidence of ischemia.  Due to her lack of ischemic symptoms and anemia cardiac catheterization was not planned.  Recommend maintaining hemoglobin greater than 8.   She presented to the clinic 11/22/2019 for follow-up evaluation and stated she was feeling much better.  She had been walking 30 to 45 minutes/day.  She was trying to eat better and used to be 100 pounds heavier.  She had not yet been to GI for follow-up evaluation for chronic anemia.  She reported several evaluations in the form of colonoscopy and EGD with no  success in finding GI bleeding in the past.  I  gave her the salty 6 diet she, asked her to increase her physical activity as tolerated, and planned follow-up with Dr. Oval Linsey in 3 months.  She was seen by Dr. Oval Linsey on 02/23/2020.  During that time she was noted to have been admitted 9/21 with altered mental status, acute kidney failure and a fall.  She was diagnosed with UTI in the setting of Klebsiella infection.  She was treated with levofloxacin and her Foley catheter was exchanged.  She returned to the hospital later that month with encephalopathy and acute renal failure.  She was felt to have severe sepsis related to urinary tract infection from Foley catheter.  She was again treated with IV antibiotics.  She reported struggling with back pain.  She had returned home with her daughter and was doing physical therapy exercises.  Her left flank pain started the day of her office visit.  She describes the pain as burning.  She denied fever and chills.  She denied injury or falls.  She had no chest pain, shortness of breath, lower extremity swelling, orthopnea or PND.  She had a follow-up appointment planned with urology 10/14 for evaluation and to see if her Foley catheter might be able to be removed.  A urinalysis showed pyelonephritis.  Patient was contacted and instructed to follow-up with her PCP.  She presents to the clinic today for follow-up evaluation and preoperative cardiac evaluation.  She states she feels well.  She feels that  her 2022 has been much better than her 2021.  She has progressed with her ambulation.  She is using a cane when she ambulates outside of her home but has progressed to not needing a cane while in her house.  Her weight has been stable.  She continues to increase her physical activity and feels that on some days she does too much.  She reports some mild left flank pain today that she feels is a" pulled muscle".  We discussed using heat/ice and acetaminophen for pain  control.  I will have her continue her current medication regimen, give a salty 6 diet sheet, increasing physical activity as tolerated follow-up in 9-12 months.   Today she denies chest pain, shortness of breath, lower extremity edema, fatigue, palpitations, melena, hematuria, hemoptysis, diaphoresis, weakness, presyncope, syncope, orthopnea, and PND.  Past Medical History:  Diagnosis Date   Aortic atherosclerosis ([place])    Arthritis    Back pain 02/23/2020   Chronic combined systolic and diastolic CHF (congestive heart failure) ([place])    Coronary atherosclerosis    Depression    Diabetes mellitus    Esophageal stricture    Fall    Hiatal hernia    Hypercholesteremia    Hypertension    Obesity    Pneumonia    Reflux    Renal disorder    shutting down 4 years ago   Subdural hematoma ([place])    Thyroid nodule    Urinary retention    UTI due to Klebsiella species     Past Surgical History:  Procedure Laterality Date   BALLOON DILATION N/A 01/25/2020   Procedure: BALLOON DILATION;  Surgeon: Carol Ada, MD;  Location: [facility];  Service: Endoscopy;  Laterality: N/A;   CHOLECYSTECTOMY     COLONOSCOPY WITH PROPOFOL N/A 05/20/2017   Procedure: COLONOSCOPY WITH PROPOFOL;  Surgeon: Carol Ada, MD;  Location: [facility];  Service: Endoscopy;  Laterality: N/A;   ESOPHAGOGASTRODUODENOSCOPY N/A 05/19/2017   Procedure: ESOPHAGOGASTRODUODENOSCOPY (EGD);  Surgeon: Juanita Craver, MD;  Location[facility];  Service: Endoscopy;  Laterality: N/A;   ESOPHAGOGASTRODUODENOSCOPY (EGD) WITH PROPOFOL N/A 01/17/2019   Procedure: ESOPHAGOGASTRODUODENOSCOPY (EGD) WITH PROPOFOL;  Surgeon: Carol Ada, MD;  [facility];  Service: Endoscopy;  Laterality: N/A;   ESOPHAGOGASTRODUODENOSCOPY (EGD) WITH PROPOFOL N/A 01/25/2020   Procedure: ESOPHAGOGASTRODUODENOSCOPY (EGD) WITH PROPOFOL;  Surgeon: Carol Ada, MD;  [facility];  Service: Endoscopy;  Laterality: N/A;    ESOPHAGOGASTRODUODENOSCOPY (EGD) WITH PROPOFOL N/A 07/09/2020   Procedure: ESOPHAGOGASTRODUODENOSCOPY (EGD) WITH PROPOFOL;  Surgeon: Carol Ada, MD;  [facility];  Service: Endoscopy;  Laterality: N/A;   SAVORY DILATION N/A 01/17/2019   Procedure: SAVORY DILATION;  Surgeon: Carol Ada, MD;  [facility];  Service: Endoscopy;  Laterality: N/A;    Current Medications: Current Meds  Medication Sig   albuterol (VENTOLIN HFA) 108 (90 Base) MCG/ACT inhaler Inhale 1-2 puffs into the lungs every 4 (four) hours as needed for wheezing or shortness of breath.   atorvastatin (LIPITOR) 20 MG tablet Take 1 tablet (20 mg total) by mouth daily.   cyclobenzaprine (FLEXERIL) 5 MG tablet Take 5 mg by mouth at bedtime.   escitalopram (LEXAPRO) 20 MG tablet Take 20 mg by mouth daily.   folic acid (FOLVITE) 1 MG tablet Take 1 tablet (1 mg total) by mouth daily.   furosemide (LASIX) 20 MG tablet Take 20 mg by mouth daily as needed for fluid.   hydrochlorothiazide (HYDRODIURIL) 25 MG tablet Take 25 mg by mouth daily.  lisinopril (ZESTRIL) 20 MG tablet Take 1 tablet (20 mg total) by mouth daily.   melatonin 5 MG TABS Take 5 mg by mouth at bedtime.   metoprolol succinate (TOPROL-XL) 25 MG 24 hr tablet Take 25 mg by mouth daily.   pantoprazole (PROTONIX) 40 MG tablet Take 1 tablet (40 mg total) by mouth 2 (two) times daily.   potassium chloride (KLOR-CON) 10 MEQ tablet Take 10 mEq by mouth daily.   rOPINIRole (REQUIP) 2 MG tablet Take 2 mg by mouth at bedtime.   [DISCONTINUED] HYDROcodone-acetaminophen (NORCO/VICODIN) 5-325 MG tablet Take 1 tablet by mouth 4 (four) times daily as needed for moderate pain.   [DISCONTINUED] lactulose (CHRONULAC) 10 GM/15ML solution Take 10-20 g by mouth daily as needed for mild constipation.     Allergies:   Amlodipine besylate   Social History   Socioeconomic History   Marital status: Married    Spouse name: Katja Blue   Number of children: Not on file    Years of education: Not on file   Highest education level: Not on file  Occupational History   Not on file  Tobacco Use   Smoking status: Never   Smokeless tobacco: Never  Vaping Use   Vaping Use: Never used  Substance and Sexual Activity   Alcohol use: No   Drug use: No   Sexual activity: Not Currently  Other Topics Concern   Not on file  Social History Narrative   Not on file   Social Determinants of Health   Financial Resource Strain: Not on file  Food Insecurity: Not on file  Transportation Needs: Not on file  Physical Activity: Not on file  Stress: Not on file  Social Connections: Not on file     Family History: The patient's family history includes Heart attack in her brother and sister; Heart failure in her brother and sister.  ROS:   Please see the history of present illness.     All other systems reviewed and are negative.   Risk Assessment/Calculations:           Physical Exam:    VS:  BP 128/70   Pulse 81   Ht 5' (1.524 m)   Wt 167 lb (75.8 kg)   BMI 32.61 kg/m     Wt Readings from Last 3 Encounters:  02/14/21 167 lb (75.8 kg)  09/02/20 157 lb 6.5 oz (71.4 kg)  07/10/20 151 lb 0.2 oz (68.5 kg)     GEN:  Well nourished, well developed in no acute distress HEENT: Normal NECK: No JVD; No carotid bruits LYMPHATICS: No lymphadenopathy CARDIAC: RRR, no murmurs, rubs, gallops RESPIRATORY:  Clear to auscultation without rales, wheezing or rhonchi  ABDOMEN: Soft, non-tender, non-distended MUSCULOSKELETAL:  No edema; No deformity  SKIN: Warm and dry NEUROLOGIC:  Alert and oriented x 3 PSYCHIATRIC:  Normal affect    EKGs/Labs/Other Studies Reviewed:    The following studies were reviewed today:   EKG 11/05/2019 Sinus tachycardia 107 bpm   Echocardiogram 11/06/2019   1. Mild global reduction in LV systolic function; grade 1 diastolic  dysfunction; mild MR; mild LAE.   2. Left ventricular ejection fraction, by estimation, is 45 to 50%.  The  left ventricle has mildly decreased function. The left ventricle  demonstrates global hypokinesis. Left ventricular diastolic parameters are  consistent with Grade I diastolic  dysfunction (impaired relaxation). Elevated left atrial pressure.   3. Right ventricular systolic function is normal. The right ventricular  size is normal. There  is mildly elevated pulmonary artery systolic  pressure.   4. Left atrial size was mildly dilated.   5. The mitral valve is normal in structure. Mild mitral valve  regurgitation. No evidence of mitral stenosis.   6. The aortic valve is tricuspid. Aortic valve regurgitation is not  visualized. Mild to moderate aortic valve sclerosis/calcification is  present, without any evidence of aortic stenosis.   7. The inferior vena cava is normal in size with greater than 50%  respiratory variability, suggesting right atrial pressure of 3 mmHg.   Limited Echocardiogram 2/22 Conclusion(s)/Recommendation(s): Limited study to assess pulmonic valve.  There appears to be a small area of the valve that is likely calcified  (see image 6), and there is a small area in the RVOT that appears to be a  filamentous structure. Compared to  prior images from yesterday, the mass like/echobright region previously  seen is not well appreciated. This area was not commented on in recent  CTPE as well. Could consider evaluation with TEE, though RVOT is most  distal structure to TEE probe and  difficult to fully evaluate. If there is significant clinical concern,  could also consider cardiac MRI. Otherwise would recommend follow up TTE  as an outpatient.   Buford Dresser MD   Echocardiogram 07/08/20 IMPRESSIONS     1. Left ventricular ejection fraction, by estimation, is 60 to 65%. The  left ventricle has normal function. The left ventricle has no regional  wall motion abnormalities. Left ventricular diastolic parameters are  consistent with Grade II diastolic   dysfunction (pseudonormalization). Elevated left atrial pressure.   2. Right ventricular systolic function is normal. The right ventricular  size is normal. There is moderately elevated pulmonary artery systolic  pressure.   3. Left atrial size was severely dilated.   4. The mitral valve is abnormal. Moderate mitral valve regurgitation.   5. The aortic valve is tricuspid. Aortic valve regurgitation is trivial.  Mild to moderate aortic valve sclerosis/calcification is present, without  any evidence of aortic stenosis.   6. Pulmonic valve is difficult to see There is an echobright density on  ventricular side of valve Cannot exclude mass/vegetation Would recomm  limited TTE exam to evaluate further If inconclusive consider TEE.   7. The inferior vena cava is normal in size with <50% respiratory  variability, suggesting right atrial pressure of 8 mmHg.  EKG:  EKG is  ordered today.  The ekg ordered today demonstrates NSR no ST or t wave deviation 81BPM   Recent Labs: 07/07/2020: B Natriuretic Peptide 381.5 07/09/2020: TSH 1.019 09/02/2020: ALT 5 09/03/2020: BUN 8; Creatinine, Ser 0.70; Hemoglobin 9.4; Magnesium 1.6; Platelets 197; Potassium 3.6; Sodium 139  Recent Lipid Panel    Component Value Date/Time   CHOL 189 03/25/2010 2105   TRIG 159 (H) 03/25/2010 2105   HDL 39 (L) 03/25/2010 2105   CHOLHDL 4.8 Ratio 03/25/2010 2105   VLDL 32 03/25/2010 2105   LDLCALC 118 (H) 03/25/2010 2105   LDLDIRECT 44 11/22/2019 1130    ASSESSMENT & PLAN    Chronic systolic and diastolic CHF-euvolemic today.  No increased work of breathing.  Echocardiogram 2/22 showed LVEF of 60-65% with global hypokinesis, G2 DD, left atria severely dilated, Moderate mitral regurgitation, trivial aortic valve regurgitation Continue furosemide Daily weights Heart healthy low-sodium diet-salty 6 given Increase physical activity as tolerated  Hyperlipidemia- LDL 118 on 03/25/2010 Continue atorvastatin Repeat lipid  panel   Primary hypertension-BP today 128/81 . Well controlled at home.  Continue lisinopril, metoprolol Heart healthy low-sodium diet-salty 6 given Increase physical activity as tolerated  Preoperative cardiac evaluation-EGD 02/18/2021, [facility], Dr. Carol Ada [phone]     Primary Cardiologist: [name], MD  Chart reviewed as part of pre-operative protocol coverage. Given past medical history and time since last visit, based on ACC/AHA guidelines, Chloe Harding would be at acceptable risk for the planned procedure without further cardiovascular testing.   Patient was advised that if she develops new symptoms prior to surgery to contact our office to arrange a follow-up appointment.  She verbalized understanding.  I will route this recommendation to the requesting party via Epic fax function and remove from pre-op pool.  Please call with questions     Follow-up in 9-12 months.   Medication Adjustments/Labs and Tests Ordered: Current medicines are reviewed at length with the patient today.  Concerns regarding medicines are outlined above.  No orders of the defined types were placed in this encounter.  No orders of the defined types were placed in this encounter.   There are no Patient Instructions on file for this visit.   Signed, Deberah Pelton, NP  02/14/2021 9:33 AM      Notice: This dictation was prepared with Dragon dictation along with smaller phrase technology. Any transcriptional errors that result from this process are unintentional and may not be corrected upon review.  I spent 13 minutes examining this patient, reviewing medications, and using patient centered shared decision making involving her cardiac care.  Prior to her visit I spent greater than 20 minutes reviewing her past medical history,  medications, and prior cardiac tests.

## 2021-02-13 ENCOUNTER — Ambulatory Visit (HOSPITAL_BASED_OUTPATIENT_CLINIC_OR_DEPARTMENT_OTHER): Payer: Medicare HMO | Admitting: Cardiovascular Disease

## 2021-02-14 ENCOUNTER — Ambulatory Visit (HOSPITAL_BASED_OUTPATIENT_CLINIC_OR_DEPARTMENT_OTHER): Payer: Medicare HMO | Admitting: General Practice

## 2021-02-14 ENCOUNTER — Encounter (HOSPITAL_BASED_OUTPATIENT_CLINIC_OR_DEPARTMENT_OTHER): Payer: Self-pay | Admitting: General Practice

## 2021-02-14 ENCOUNTER — Other Ambulatory Visit: Payer: Self-pay

## 2021-02-14 VITALS — BP 128/70 | HR 81 | Ht 60.0 in | Wt 167.0 lb

## 2021-02-14 DIAGNOSIS — I5041 Acute combined systolic (congestive) and diastolic (congestive) heart failure: Secondary | ICD-10-CM

## 2021-02-14 DIAGNOSIS — Z01810 Encounter for preprocedural cardiovascular examination: Secondary | ICD-10-CM | POA: Diagnosis not present

## 2021-02-14 DIAGNOSIS — E7800 Pure hypercholesterolemia, unspecified: Secondary | ICD-10-CM | POA: Diagnosis not present

## 2021-02-14 DIAGNOSIS — I10 Essential (primary) hypertension: Secondary | ICD-10-CM | POA: Diagnosis not present

## 2021-02-14 NOTE — Patient Instructions (Signed)
Medication Instructions:  Continue current medications  *If you need a refill on your cardiac medications before your next appointment, please call your pharmacy*   Lab Work: Lipid and Liver  If you have labs (blood work) drawn today and your tests are completely normal, you will receive your results only by: [place] (if you have MyChart) OR A paper copy in the mail If you have any lab test that is abnormal or we need to change your treatment, we will call you to review the results.   Testing/Procedures: None Ordered   Follow-Up: At [facility], you and your health needs are our priority.  As part of our continuing mission to provide you with exceptional heart care, we have created designated Provider Care Teams.  These Care Teams include your primary Cardiologist (physician) and Advanced Practice Providers (APPs -  Physician Assistants and Nurse Practitioners) who all work together to provide you with the care you need, when you need it.  We recommend signing up for the patient portal called "MyChart".  Sign up information is provided on this After Visit Summary.  MyChart is used to connect with patients for Virtual Visits (Telemedicine).  Patients are able to view lab/test results, encounter notes, upcoming appointments, etc.  Non-urgent messages can be sent to your provider as well.   To learn more about what you can do with MyChart, go to [email or web address].    Your next appointment:   9-12 month(s)  The format for your next appointment:   In Person  Provider:   [name], MD   Other Instructions Your are Cleared for your upcoming procedure

## 2021-02-17 NOTE — Addendum Note (Signed)
Addended by: Ernie Hew D on: 02/17/2021 02:40 PM   Modules accepted: Orders

## 2021-02-18 ENCOUNTER — Other Ambulatory Visit: Payer: Self-pay

## 2021-02-18 ENCOUNTER — Non-Acute Institutional Stay (HOSPITAL_COMMUNITY)
Admission: RE | Admit: 2021-02-18 | Discharge: 2021-02-18 | Disposition: A | Discharge status: Home / Self Care | Payer: Medicare HMO | Source: Ambulatory Visit | Attending: Internal Medicine | Admitting: Internal Medicine

## 2021-02-18 DIAGNOSIS — D509 Iron deficiency anemia, unspecified: Secondary | ICD-10-CM | POA: Diagnosis present

## 2021-02-18 LAB — PREPARE RBC (CROSSMATCH)

## 2021-02-18 MED ORDER — SODIUM CHLORIDE 0.9% IV SOLUTION: Freq: Once | INTRAVENOUS | Status: AC

## 2021-02-18 NOTE — Progress Notes (Signed)
PATIENT CARE CENTER NOTE    Diagnosis: D50.9   Provider: Carol Ada, MD   Procedure: Blood transfusion    Note:  Patient received 2 units PRBC's via PIV. Patient tolerated transfusion well with no adverse reaction. Patient's BP elevated throughout transfusion but remained steady. Other vital signs wnl. Discharge instructions given. Patient stable, alert, oriented and ambulatory to wheelchair at discharge.

## 2021-02-19 LAB — BPAM RBC
Blood Product Expiration Date: 202211032359
Blood Product Expiration Date: 202211032359
ISSUE DATE / TIME: 202210041115
ISSUE DATE / TIME: 202210041115
Unit Type and Rh: 5100
Unit Type and Rh: 5100

## 2021-02-19 LAB — TYPE AND SCREEN
ABO/RH(D): O POS
Antibody Screen: NEGATIVE
Unit division: 0
Unit division: 0

## 2021-10-09 ENCOUNTER — Other Ambulatory Visit: Payer: Self-pay | Admitting: Gastroenterology

## 2021-10-09 NOTE — Anesthesia Preprocedure Evaluation (Addendum)
Anesthesia Evaluation  Patient identified by MRN, date of birth, ID band Patient awake    Reviewed: Allergy & Precautions, NPO status , Patient's Chart, lab work & pertinent test results, reviewed documented beta blocker date and time   History of Anesthesia Complications Negative for: history of anesthetic complications  Airway Mallampati: II  TM Distance: >3 FB Neck ROM: Full    Dental  (+) Poor Dentition, Loose, Missing, Dental Advisory Given   Pulmonary COPD,  COPD inhaler,    breath sounds clear to auscultation       Cardiovascular hypertension, Pt. on home beta blockers and Pt. on medications (-) angina Rhythm:Regular Rate:Normal  06/2020 ECHO: EF 60 -65%. The LV has normal function,  no regional wall motion abnormalities. Grade II DD, AI is trivial. Mild to moderate aortic valve sclerosis/calcification is present, no AS. Cannot exclude mass/vegetation on pulm valve   Neuro/Psych Anxiety Depression H/o SDH    GI/Hepatic Neg liver ROS, GERD  Medicated and Controlled,esoph stricture   Endo/Other  diabetes (maybe borderline)  Renal/GU Renal InsufficiencyRenal disease     Musculoskeletal  (+) Arthritis ,   Abdominal (+) + obese,   Peds  Hematology   Anesthesia Other Findings   Reproductive/Obstetrics                            Anesthesia Physical Anesthesia Plan  ASA: 3  Anesthesia Plan: MAC   Post-op Pain Management: Minimal or no pain anticipated   Induction:   PONV Risk Score and Plan: 2 and Ondansetron and Treatment may vary due to age or medical condition  Airway Management Planned: Natural Airway and Nasal Cannula  Additional Equipment: None  Intra-op Plan:   Post-operative Plan:   Informed Consent: I have reviewed the patients History and Physical, chart, labs and discussed the procedure including the risks, benefits and alternatives for the proposed anesthesia with the  patient or authorized representative who has indicated his/her understanding and acceptance.     Dental advisory given  Plan Discussed with: CRNA and Surgeon  Anesthesia Plan Comments:        Anesthesia Quick Evaluation

## 2021-10-10 ENCOUNTER — Ambulatory Visit (HOSPITAL_COMMUNITY): Payer: Medicare Other | Admitting: Anesthesiology

## 2021-10-10 ENCOUNTER — Ambulatory Visit (HOSPITAL_BASED_OUTPATIENT_CLINIC_OR_DEPARTMENT_OTHER): Payer: Medicare Other | Admitting: Anesthesiology

## 2021-10-10 ENCOUNTER — Encounter (HOSPITAL_COMMUNITY): Payer: Self-pay | Admitting: Gastroenterology

## 2021-10-10 ENCOUNTER — Encounter (HOSPITAL_COMMUNITY)
Admission: RE | Disposition: A | Discharge status: Home / Self Care | Payer: Self-pay | Source: Home / Self Care | Attending: Gastroenterology

## 2021-10-10 ENCOUNTER — Ambulatory Visit (HOSPITAL_COMMUNITY)
Admission: RE | Admit: 2021-10-10 | Discharge: 2021-10-10 | Disposition: A | Discharge status: Home / Self Care | Payer: Medicare Other | Attending: Gastroenterology | Admitting: Gastroenterology

## 2021-10-10 ENCOUNTER — Other Ambulatory Visit: Payer: Self-pay

## 2021-10-10 DIAGNOSIS — I10 Essential (primary) hypertension: Secondary | ICD-10-CM | POA: Diagnosis not present

## 2021-10-10 DIAGNOSIS — K449 Diaphragmatic hernia without obstruction or gangrene: Secondary | ICD-10-CM | POA: Insufficient documentation

## 2021-10-10 DIAGNOSIS — R1310 Dysphagia, unspecified: Secondary | ICD-10-CM | POA: Diagnosis present

## 2021-10-10 DIAGNOSIS — K222 Esophageal obstruction: Secondary | ICD-10-CM

## 2021-10-10 DIAGNOSIS — J449 Chronic obstructive pulmonary disease, unspecified: Secondary | ICD-10-CM

## 2021-10-10 HISTORY — PX: BALLOON DILATION: SHX5330

## 2021-10-10 HISTORY — PX: ESOPHAGOGASTRODUODENOSCOPY (EGD) WITH PROPOFOL: SHX5813

## 2021-10-10 SURGERY — ESOPHAGOGASTRODUODENOSCOPY (EGD) WITH PROPOFOL
Anesthesia: Monitor Anesthesia Care

## 2021-10-10 MED ORDER — PROPOFOL 10 MG/ML IV BOLUS
INTRAVENOUS | Status: DC | PRN
  Administered 2021-10-10: 40 mg via INTRAVENOUS
  Administered 2021-10-10 (×2): 20 mg via INTRAVENOUS

## 2021-10-10 MED ORDER — ONDANSETRON HCL 4 MG/2ML IJ SOLN
INTRAMUSCULAR | Status: DC | PRN
  Administered 2021-10-10: 4 mg via INTRAVENOUS

## 2021-10-10 MED ORDER — LIDOCAINE 2% (20 MG/ML) 5 ML SYRINGE
INTRAMUSCULAR | Status: DC | PRN
  Administered 2021-10-10: 60 mg via INTRAVENOUS

## 2021-10-10 MED ORDER — LACTATED RINGERS IV SOLN: INTRAVENOUS | Status: DC | PRN

## 2021-10-10 MED ORDER — PROPOFOL 500 MG/50ML IV EMUL
INTRAVENOUS | Status: DC | PRN
Start: 2021-10-10 — End: 2021-10-10
  Administered 2021-10-10: 125 ug/kg/min via INTRAVENOUS

## 2021-10-10 MED ORDER — SODIUM CHLORIDE 0.9 % IV SOLN
INTRAVENOUS | Status: DC
Start: 2021-10-10 — End: 2021-10-10

## 2021-10-10 SURGICAL SUPPLY — 15 items

## 2021-10-10 NOTE — Progress Notes (Signed)
Bp upon admission for EGD 217/95. Bp at DC to home 202/77. Dr Glennon Mac informed, ok to dc home to resume home bp med regime

## 2021-10-10 NOTE — H&P (Signed)
Sammuel Hines HPI: The patient reports having coffee-ground emesis last Monday.  Her PCP check her HGB and it was at 7 g/dL, but previously it was at 8 g/dL.  The patient reports taking her pantoprazole 40 mg BID consistently.  There were no reports of melena or hematochezia.  Also, she states that dysphagia is an issue and it started a few months ago.  Her last EGD was on 07/09/2020 when she was admitted for an aspiration pneumonia.  An LA Grade D with strictures was identified.   Past Medical History:  Diagnosis Date   Aortic atherosclerosis ([place])    Arthritis    Back pain 02/23/2020   Chronic combined systolic and diastolic CHF (congestive heart failure) ([place])    Coronary atherosclerosis    Depression    Diabetes mellitus    Esophageal stricture    Fall    Hiatal hernia    Hypercholesteremia    Hypertension    Obesity    Pneumonia    Reflux    Renal disorder    shutting down 4 years ago   Subdural hematoma ([place])    Thyroid nodule    Urinary retention    UTI due to Klebsiella species     Past Surgical History:  Procedure Laterality Date   BALLOON DILATION N/A 01/25/2020   Procedure: BALLOON DILATION;  Surgeon: Carol Ada, MD;  [facility];  Service: Endoscopy;  Laterality: N/A;   CHOLECYSTECTOMY     COLONOSCOPY WITH PROPOFOL N/A 05/20/2017   Procedure: COLONOSCOPY WITH PROPOFOL;  Surgeon: Carol Ada, MD;  Location: [facility];  Service: Endoscopy;  Laterality: N/A;   ESOPHAGOGASTRODUODENOSCOPY N/A 05/19/2017   Procedure: ESOPHAGOGASTRODUODENOSCOPY (EGD);  Surgeon: Juanita Craver, MD;  Location: [facility] ENDOSCOPY;  Service: Endoscopy;  Laterality: N/A;   ESOPHAGOGASTRODUODENOSCOPY (EGD) WITH PROPOFOL N/A 01/17/2019   Procedure: ESOPHAGOGASTRODUODENOSCOPY (EGD) WITH PROPOFOL;  Surgeon: Carol Ada, MD;  [facility];  Service: Endoscopy;  Laterality: N/A;   ESOPHAGOGASTRODUODENOSCOPY (EGD) WITH PROPOFOL N/A 01/25/2020   Procedure: ESOPHAGOGASTRODUODENOSCOPY (EGD)  WITH PROPOFOL;  Surgeon: Carol Ada, MD;  [facility];  Service: Endoscopy;  Laterality: N/A;   ESOPHAGOGASTRODUODENOSCOPY (EGD) WITH PROPOFOL N/A 07/09/2020   Procedure: ESOPHAGOGASTRODUODENOSCOPY (EGD) WITH PROPOFOL;  Surgeon: Carol Ada, MD;  [facility];  Service: Endoscopy;  Laterality: N/A;   SAVORY DILATION N/A 01/17/2019   Procedure: SAVORY DILATION;  Surgeon: Carol Ada, MD;  [facility];  Service: Endoscopy;  Laterality: N/A;    Family History  Problem Relation Age of Onset   Heart attack Sister    Heart failure Sister    Heart attack Brother    Heart failure Brother     Social History:  reports that she has never smoked. She has never used smokeless tobacco. She reports that she does not drink alcohol and does not use drugs.  Allergies:  Allergies  Allergen Reactions   Amlodipine Besylate Swelling    Medications: Scheduled: Continuous:  sodium chloride      No results found for this or any previous visit (from the past 24 hour(s)).   No results found.  ROS:  As stated above in the HPI otherwise negative.  Blood pressure (!) 217/95, pulse 74, temperature 98.2 F (36.8 C), temperature source Oral, resp. rate 17, height 5' (1.524 m), weight 70.8 kg, SpO2 100 %.    PE: Gen: NAD, Alert and Oriented HEENT:  [place]/AT, EOMI Neck: Supple, no LAD Lungs: CTA Bilaterally CV: RRR without M/G/R ABD: Soft, NTND, +BS  Ext: No C/C/E  Assessment/Plan: 1) Dysphagia. 2) Coffee-ground emesis.  Plan: 1) EGD with possible dilation.  Laconya Clere D 10/10/2021, 7:42 AM

## 2021-10-10 NOTE — Op Note (Signed)
[facility] Patient Name: Chloe Harding Procedure Date: 10/10/2021 MRN: [id number] Attending MD: Carol Ada , MD Date of Birth: 08/16/1945 CSN: [id number] Age: 76 Admit Type: Outpatient Procedure:                Upper GI endoscopy Indications:              Dysphagia Providers:                Carol Ada, MD, Grace Isaac, RN, Tyna Jaksch                            Technician, Darliss Cheney, Technician Referring MD:              Medicines:                Propofol per Anesthesia Complications:            No immediate complications. Estimated Blood Loss:     Estimated blood loss: none. Procedure:                Pre-Anesthesia Assessment:                           - Prior to the procedure, a History and Physical                            was performed, and patient medications and                            allergies were reviewed. The patient's tolerance of                            previous anesthesia was also reviewed. The risks                            and benefits of the procedure and the sedation                            options and risks were discussed with the patient.                            All questions were answered, and informed consent                            was obtained. Prior Anticoagulants: The patient has                            taken no previous anticoagulant or antiplatelet                            agents. ASA Grade Assessment: III - A patient with                            severe systemic disease. After reviewing the risks  and benefits, the patient was deemed in                            satisfactory condition to undergo the procedure.                           - Sedation was administered by an anesthesia                            professional. Deep sedation was attained.                           After obtaining informed consent, the endoscope was                            passed under direct vision.  Throughout the                            procedure, the patient's blood pressure, pulse, and                            oxygen saturations were monitored continuously. The                            [id number]-H190 ([id number] Olympus endoscope was introduced                            through the mouth, and advanced to the second part                            of duodenum. The upper GI endoscopy was                            accomplished without difficulty. The patient                            tolerated the procedure well. Scope In: Scope Out: Findings:      Two benign-appearing, intrinsic severe stenoses were found 30 cm from       the incisors. The narrowest stenosis measured 9 mm (inner diameter) x 1       cm (in length). The stenoses were traversed after dilation. A TTS       dilator was passed through the scope. Dilation with a 02-26-11 mm       balloon dilator was performed to 11 mm. The dilation site was examined       and showed complete resolution of luminal narrowing. Estimated blood       loss was minimal.      A 7 cm hiatal hernia was present.      The stomach was normal.      The examined duodenum was normal.      At 20 cm there was evidence of several eccentric stenoses. These were       disrupted with using the biospy forceps. This area of stenosis was very       mild. At 30 cm there was a severe stenosis  and the endoscope was not       able to pass. Balloon dilation was performed initially with the 8-10 mm       balloon. At the 8 mm and 9 mm diameter the balloon was able to move       freely. At the 10 mm diameter the balloon was held in place and the       stricture was dilated. Mucosal disruption was visualized through the       balloon. After this dilation the endoscope passed with greater ease.       Further dilation was performed at 11 mm and then at 11.5 mm. Impression:               - Benign-appearing esophageal stenoses. Dilated.                           - 7 cm  hiatal hernia.                           - Normal stomach.                           - Normal examined duodenum.                           - No specimens collected. Moderate Sedation:      Not Applicable - Patient had care per Anesthesia. Recommendation:           - Patient has a contact number available for                            emergencies. The signs and symptoms of potential                            delayed complications were discussed with the                            patient. Return to normal activities tomorrow.                            Written discharge instructions were provided to the                            patient.                           - Resume previous diet.                           - Continue present medications.                           - Repeat upper endoscopy in 2-4 weeks for                            retreatment. Procedure Code(s):        --- Professional ---  [phone], Esophagogastroduodenoscopy, flexible,                            transoral; with transendoscopic balloon dilation of                            esophagus (less than 30 mm diameter) Diagnosis Code(s):        --- Professional ---                           K22.2, Esophageal obstruction                           K44.9, Diaphragmatic hernia without obstruction or                            gangrene                           R13.10, Dysphagia, unspecified CPT copyright 2019 [employer]. All rights reserved. The codes documented in this report are preliminary and upon coder review may  be revised to meet current compliance requirements. Carol Ada, MD Carol Ada, MD 10/10/2021 8:32:12 AM This report has been signed electronically. Number of Addenda: 0

## 2021-10-10 NOTE — Anesthesia Postprocedure Evaluation (Signed)
Anesthesia Post Note  Patient: Chloe Harding  Procedure(s) Performed: ESOPHAGOGASTRODUODENOSCOPY (EGD) WITH PROPOFOL BALLOON DILATION     Patient location during evaluation: Endoscopy Anesthesia Type: MAC Level of consciousness: awake and alert, patient cooperative and oriented Pain management: pain level controlled Vital Signs Assessment: post-procedure vital signs reviewed and stable Respiratory status: nonlabored ventilation, spontaneous breathing and respiratory function stable Cardiovascular status: stable and blood pressure returned to baseline Postop Assessment: no apparent nausea or vomiting and able to ambulate Anesthetic complications: no   No notable events documented.  Last Vitals:  Vitals:   10/10/21 0840 10/10/21 0850  BP: (!) 149/67 (!) 197/97  Pulse: 67 80  Resp: 14 18  Temp:    SpO2: 100% 98%    Last Pain:  Vitals:   10/10/21 0850  TempSrc:   PainSc: 0-No pain                 Georgann Bramble,E. Cynthie Garmon

## 2021-10-10 NOTE — Discharge Instructions (Addendum)

## 2021-10-10 NOTE — Transfer of Care (Signed)
Immediate Anesthesia Transfer of Care Note  Patient: Chloe Harding  Procedure(s) Performed: ESOPHAGOGASTRODUODENOSCOPY (EGD) WITH PROPOFOL BALLOON DILATION  Patient Location: Endoscopy Unit  Anesthesia Type:MAC  Level of Consciousness: drowsy and patient cooperative  Airway & Oxygen Therapy: Patient Spontanous Breathing and Patient connected to face mask oxygen  Post-op Assessment: Report given to RN and Post -op Vital signs reviewed and stable  Post vital signs: Reviewed and stable  Last Vitals:  Vitals Value Taken Time  BP    Temp    Pulse 68 10/10/21 0830  Resp 14 10/10/21 0830  SpO2 100 % 10/10/21 0830  Vitals shown include unvalidated device data.  Last Pain:  Vitals:   10/10/21 0736  TempSrc: Oral  PainSc: 0-No pain         Complications: No notable events documented.

## 2021-10-14 ENCOUNTER — Encounter (HOSPITAL_COMMUNITY): Payer: Self-pay | Admitting: Gastroenterology

## 2021-10-17 ENCOUNTER — Other Ambulatory Visit: Payer: Self-pay | Admitting: Gastroenterology

## 2021-11-10 ENCOUNTER — Encounter (HOSPITAL_COMMUNITY): Payer: Self-pay | Admitting: Emergency Medicine

## 2021-11-10 ENCOUNTER — Observation Stay (HOSPITAL_COMMUNITY)
Admission: EM | Admit: 2021-11-10 | Discharge: 2021-11-11 | Disposition: A | Discharge status: Home / Self Care | Payer: Medicare Other | Attending: Internal Medicine | Admitting: Internal Medicine

## 2021-11-10 ENCOUNTER — Emergency Department (HOSPITAL_COMMUNITY): Payer: Medicare Other

## 2021-11-10 DIAGNOSIS — I959 Hypotension, unspecified: Secondary | ICD-10-CM | POA: Insufficient documentation

## 2021-11-10 DIAGNOSIS — R000 Tachycardia, unspecified: Secondary | ICD-10-CM | POA: Insufficient documentation

## 2021-11-10 DIAGNOSIS — E876 Hypokalemia: Secondary | ICD-10-CM | POA: Diagnosis not present

## 2021-11-10 DIAGNOSIS — R0600 Dyspnea, unspecified: Secondary | ICD-10-CM | POA: Insufficient documentation

## 2021-11-10 DIAGNOSIS — I2510 Atherosclerotic heart disease of native coronary artery without angina pectoris: Secondary | ICD-10-CM | POA: Insufficient documentation

## 2021-11-10 DIAGNOSIS — E1122 Type 2 diabetes mellitus with diabetic chronic kidney disease: Secondary | ICD-10-CM | POA: Diagnosis not present

## 2021-11-10 DIAGNOSIS — R0602 Shortness of breath: Secondary | ICD-10-CM | POA: Insufficient documentation

## 2021-11-10 DIAGNOSIS — Z8679 Personal history of other diseases of the circulatory system: Secondary | ICD-10-CM | POA: Diagnosis not present

## 2021-11-10 DIAGNOSIS — R55 Syncope and collapse: Secondary | ICD-10-CM | POA: Diagnosis present

## 2021-11-10 DIAGNOSIS — E119 Type 2 diabetes mellitus without complications: Secondary | ICD-10-CM

## 2021-11-10 DIAGNOSIS — K2100 Gastro-esophageal reflux disease with esophagitis, without bleeding: Secondary | ICD-10-CM | POA: Diagnosis not present

## 2021-11-10 DIAGNOSIS — E669 Obesity, unspecified: Secondary | ICD-10-CM | POA: Diagnosis not present

## 2021-11-10 DIAGNOSIS — M069 Rheumatoid arthritis, unspecified: Secondary | ICD-10-CM | POA: Diagnosis not present

## 2021-11-10 DIAGNOSIS — R600 Localized edema: Secondary | ICD-10-CM | POA: Diagnosis not present

## 2021-11-10 DIAGNOSIS — E878 Other disorders of electrolyte and fluid balance, not elsewhere classified: Secondary | ICD-10-CM | POA: Diagnosis not present

## 2021-11-10 DIAGNOSIS — N189 Chronic kidney disease, unspecified: Secondary | ICD-10-CM | POA: Insufficient documentation

## 2021-11-10 DIAGNOSIS — R9431 Abnormal electrocardiogram [ECG] [EKG]: Secondary | ICD-10-CM | POA: Diagnosis not present

## 2021-11-10 DIAGNOSIS — E7800 Pure hypercholesterolemia, unspecified: Secondary | ICD-10-CM | POA: Insufficient documentation

## 2021-11-10 DIAGNOSIS — R1310 Dysphagia, unspecified: Secondary | ICD-10-CM | POA: Insufficient documentation

## 2021-11-10 DIAGNOSIS — R2681 Unsteadiness on feet: Secondary | ICD-10-CM | POA: Diagnosis not present

## 2021-11-10 DIAGNOSIS — Z79899 Other long term (current) drug therapy: Secondary | ICD-10-CM | POA: Diagnosis not present

## 2021-11-10 DIAGNOSIS — I498 Other specified cardiac arrhythmias: Secondary | ICD-10-CM | POA: Insufficient documentation

## 2021-11-10 DIAGNOSIS — F32A Depression, unspecified: Secondary | ICD-10-CM | POA: Insufficient documentation

## 2021-11-10 DIAGNOSIS — I130 Hypertensive heart and chronic kidney disease with heart failure and stage 1 through stage 4 chronic kidney disease, or unspecified chronic kidney disease: Secondary | ICD-10-CM | POA: Insufficient documentation

## 2021-11-10 DIAGNOSIS — I5042 Chronic combined systolic (congestive) and diastolic (congestive) heart failure: Secondary | ICD-10-CM | POA: Diagnosis not present

## 2021-11-10 DIAGNOSIS — Z8719 Personal history of other diseases of the digestive system: Secondary | ICD-10-CM | POA: Insufficient documentation

## 2021-11-10 DIAGNOSIS — D6489 Other specified anemias: Secondary | ICD-10-CM | POA: Insufficient documentation

## 2021-11-10 DIAGNOSIS — E8809 Other disorders of plasma-protein metabolism, not elsewhere classified: Secondary | ICD-10-CM | POA: Diagnosis not present

## 2021-11-10 DIAGNOSIS — Z6830 Body mass index (BMI) 30.0-30.9, adult: Secondary | ICD-10-CM | POA: Insufficient documentation

## 2021-11-10 DIAGNOSIS — Z9889 Other specified postprocedural states: Secondary | ICD-10-CM | POA: Insufficient documentation

## 2021-11-10 DIAGNOSIS — Z8249 Family history of ischemic heart disease and other diseases of the circulatory system: Secondary | ICD-10-CM | POA: Insufficient documentation

## 2021-11-10 DIAGNOSIS — I080 Rheumatic disorders of both mitral and aortic valves: Secondary | ICD-10-CM | POA: Insufficient documentation

## 2021-11-10 DIAGNOSIS — E860 Dehydration: Secondary | ICD-10-CM | POA: Insufficient documentation

## 2021-11-10 LAB — COMPREHENSIVE METABOLIC PANEL
ALT: 9 U/L (ref 0–44)
AST: 19 U/L (ref 15–41)
Albumin: 3.2 g/dL — ABNORMAL LOW (ref 3.5–5.0)
Alkaline Phosphatase: 102 U/L (ref 38–126)
Anion gap: 7 (ref 5–15)
BUN: 6 mg/dL — ABNORMAL LOW (ref 8–23)
CO2: 25 mmol/L (ref 22–32)
Calcium: 7.8 mg/dL — ABNORMAL LOW (ref 8.9–10.3)
Chloride: 111 mmol/L (ref 98–111)
Creatinine, Ser: 0.74 mg/dL (ref 0.44–1.00)
GFR, Estimated: >60 mL/min (ref >60)
Glucose, Bld: 143 mg/dL — ABNORMAL HIGH (ref 70–99)
Potassium: 2.7 mmol/L — CL (ref 3.5–5.1)
Sodium: 143 mmol/L (ref 135–145)
Total Bilirubin: 0.5 mg/dL (ref 0.3–1.2)
Total Protein: 6.5 g/dL (ref 6.5–8.1)

## 2021-11-10 LAB — CBC WITH DIFFERENTIAL/PLATELET
Abs Immature Granulocytes: 0.01 10*3/uL (ref 0.00–0.07)
Basophils Absolute: 0 10*3/uL (ref 0.0–0.1)
Basophils Relative: 0 %
Eosinophils Absolute: 0.1 10*3/uL (ref 0.0–0.5)
Eosinophils Relative: 3 %
HCT: 26.5 % — ABNORMAL LOW (ref 36.0–46.0)
Hemoglobin: 7.7 g/dL — ABNORMAL LOW (ref 12.0–15.0)
Immature Granulocytes: 0 %
Lymphocytes Relative: 46 %
Lymphs Abs: 2.3 10*3/uL (ref 0.7–4.0)
MCH: 21 pg — ABNORMAL LOW (ref 26.0–34.0)
MCHC: 29.1 g/dL — ABNORMAL LOW (ref 30.0–36.0)
MCV: 72.4 fL — ABNORMAL LOW (ref 80.0–100.0)
Monocytes Absolute: 0.4 10*3/uL (ref 0.1–1.0)
Monocytes Relative: 7 %
Neutro Abs: 2.3 10*3/uL (ref 1.7–7.7)
Neutrophils Relative %: 44 %
Platelets: 218 10*3/uL (ref 150–400)
RBC: 3.66 MIL/uL — ABNORMAL LOW (ref 3.87–5.11)
RDW: 23.4 % — ABNORMAL HIGH (ref 11.5–15.5)
WBC: 5.1 10*3/uL (ref 4.0–10.5)
nRBC: 0 % (ref 0.0–0.2)

## 2021-11-10 LAB — CBG MONITORING, ED: Glucose-Capillary: 127 mg/dL — ABNORMAL HIGH (ref 70–99)

## 2021-11-10 LAB — TROPONIN I (HIGH SENSITIVITY)
Troponin I (High Sensitivity): 5 ng/L (ref <18)
Troponin I (High Sensitivity): 5 ng/L (ref <18)

## 2021-11-10 LAB — MAGNESIUM: Magnesium: 1.5 mg/dL — ABNORMAL LOW (ref 1.7–2.4)

## 2021-11-10 MED ORDER — POTASSIUM CHLORIDE 10 MEQ/100ML IV SOLN
10.0000 meq | INTRAVENOUS | Status: AC
  Administered 2021-11-10 – 2021-11-11 (×5): 10 meq via INTRAVENOUS
  Filled 2021-11-10 (×5): qty 100

## 2021-11-10 MED ORDER — POTASSIUM CHLORIDE CRYS ER 20 MEQ PO TBCR
40.0000 meq | EXTENDED_RELEASE_TABLET | Freq: Once | ORAL | Status: AC
  Administered 2021-11-10: 40 meq via ORAL
  Filled 2021-11-10: qty 2

## 2021-11-10 MED ORDER — MAGNESIUM SULFATE 2 GM/50ML IV SOLN
2.0000 g | Freq: Once | INTRAVENOUS | Status: AC
  Administered 2021-11-10: 2 g via INTRAVENOUS
  Filled 2021-11-10: qty 50

## 2021-11-10 MED ORDER — LACTATED RINGERS IV BOLUS
500.0000 mL | Freq: Once | INTRAVENOUS | Status: AC
  Administered 2021-11-10: 500 mL via INTRAVENOUS

## 2021-11-10 NOTE — ED Provider Notes (Signed)
[facility]-EMERGENCY DEPT Provider Note   CSN: [id number] Arrival date & time: 11/10/21  1855     History  Chief Complaint  Patient presents with   Near Syncope    Chloe Harding is a 76 y.o. female.   Near Syncope  Patient presents for near syncope.  Medical history includes DM, HLD, obesity, depression, HTN, GERD, CKD, anemia, CHF, CAD.  He states that he was in her normal state of health earlier today.  This evening, she was outside with her grandson, who is 67 years old.  At that time of day, it was hot outside.  He only stayed outside for 10 to 15 minutes.  She had a little woozy when she went to walk back in the home.  Patient went to prepare supper.  She was feeling some potatoes while standing.  She became dizzy and lightheaded.  She sat down to rest.  When she stood up again, she had recurrence of symptoms.  When she checked her blood pressure at home it was in the range of 100/60.  She had a doctors appointment earlier in the day, at which point, SBP was in the 130s.  She does take blood pressure medications, but takes these in the mornings.  At that point, she called EMS.  EMS reports that she was unsteady on her feet while walking the stretcher.  The patient endorses continued near syncopal symptoms at that time as well.  Patient states that she has had a normal p.o. intake lately.  She denies any recent vomiting, diarrhea, or bleeding.  She does have a history of anemia and her baseline hemoglobin is in the sevens.  She has required blood transfusions in the past.  With her no syncopal episodes, she denies any acute pain or discomfort.     Home Medications Prior to Admission medications   Medication Sig Start Date End Date Taking? Authorizing Provider  albuterol (VENTOLIN HFA) 108 (90 Base) MCG/ACT inhaler Inhale 1-2 puffs into the lungs every 4 (four) hours as needed for wheezing or shortness of breath. 06/28/20   [provider]  atorvastatin  (LIPITOR) 20 MG tablet Take 1 tablet (20 mg total) by mouth daily. 02/18/20   Almon Hercules, MD  carvedilol (COREG CR) 40 MG 24 hr capsule Take 40 mg by mouth daily.    [provider]  cyclobenzaprine (FLEXERIL) 5 MG tablet Take 5 mg by mouth at bedtime. 08/19/20   [provider]  escitalopram (LEXAPRO) 20 MG tablet Take 20 mg by mouth daily. 06/21/20   [provider]  folic acid (FOLVITE) 1 MG tablet Take 1 tablet (1 mg total) by mouth daily. 02/14/20   Medina-Vargas, Monina C, NP  furosemide (LASIX) 20 MG tablet Take 20 mg by mouth daily as needed for fluid. 08/19/20   [provider]  hydrochlorothiazide (HYDRODIURIL) 25 MG tablet Take 25 mg by mouth daily.    [provider]  lisinopril (ZESTRIL) 20 MG tablet Take 1 tablet (20 mg total) by mouth daily. 09/03/20 02/14/21  Noralee Stain, DO  melatonin 5 MG TABS Take 5 mg by mouth at bedtime.    [provider]  metoprolol succinate (TOPROL-XL) 25 MG 24 hr tablet Take 25 mg by mouth daily. 07/04/20   [provider]  pantoprazole (PROTONIX) 40 MG tablet Take 1 tablet (40 mg total) by mouth 2 (two) times daily. 02/18/20   Almon Hercules, MD  potassium chloride (KLOR-CON) 10 MEQ tablet Take  10 mEq by mouth daily. 06/21/20   [provider]  rOPINIRole (REQUIP) 2 MG tablet Take 2 mg by mouth at bedtime. 08/19/20   [provider]      Allergies    Amlodipine besylate    Review of Systems   Review of Systems  Cardiovascular:  Positive for near-syncope.  Neurological:  Positive for dizziness and light-headedness.  All other systems reviewed and are negative.   Physical Exam Updated Vital Signs BP (!) 172/89   Pulse 92   Resp (!) 22   SpO2 100%  Physical Exam Vitals and nursing note reviewed.  Constitutional:      General: She is not in acute distress.    Appearance: Normal appearance. She is well-developed. She is not ill-appearing, toxic-appearing or diaphoretic.   HENT:     Head: Normocephalic and atraumatic.     Right Ear: External ear normal.     Left Ear: External ear normal.     Nose: Nose normal.     Mouth/Throat:     Mouth: Mucous membranes are moist.     Pharynx: Oropharynx is clear.  Eyes:     Extraocular Movements: Extraocular movements intact.     Conjunctiva/sclera: Conjunctivae normal.  Cardiovascular:     Rate and Rhythm: Normal rate and regular rhythm.     Heart sounds: No murmur heard. Pulmonary:     Effort: Pulmonary effort is normal. No respiratory distress.     Breath sounds: Normal breath sounds. No wheezing or rales.  Abdominal:     Palpations: Abdomen is soft.     Tenderness: There is no abdominal tenderness.  Musculoskeletal:        General: No swelling. Normal range of motion.     Cervical back: Normal range of motion and neck supple. No rigidity.     Right lower leg: Edema present.     Left lower leg: Edema present.  Skin:    General: Skin is warm and dry.     Coloration: Skin is pale (Patient states she is pale at baseline).     Findings: No rash.  Neurological:     General: No focal deficit present.     Mental Status: She is alert and oriented to person, place, and time.     Cranial Nerves: No cranial nerve deficit.     Sensory: No sensory deficit.     Motor: No weakness.     Coordination: Coordination normal.  Psychiatric:        Mood and Affect: Mood normal.        Behavior: Behavior normal.        Thought Content: Thought content normal.        Judgment: Judgment normal.     ED Results / Procedures / Treatments   Labs (all labs ordered are listed, but only abnormal results are displayed) Labs Reviewed  CBC WITH DIFFERENTIAL/PLATELET - Abnormal; Notable for the following components:      Result Value   RBC 3.66 (*)    Hemoglobin 7.7 (*)    HCT 26.5 (*)    MCV 72.4 (*)    MCH 21.0 (*)    MCHC 29.1 (*)    RDW 23.4 (*)    All other components within normal limits  COMPREHENSIVE METABOLIC  PANEL - Abnormal; Notable for the following components:   Potassium 2.7 (*)    Glucose, Bld 143 (*)    BUN 6 (*)    Calcium 7.8 (*)  Albumin 3.2 (*)    All other components within normal limits  MAGNESIUM - Abnormal; Notable for the following components:   Magnesium 1.5 (*)    All other components within normal limits  CBG MONITORING, ED - Abnormal; Notable for the following components:   Glucose-Capillary 127 (*)    All other components within normal limits  URINALYSIS, ROUTINE W REFLEX MICROSCOPIC  TROPONIN I (HIGH SENSITIVITY)  TROPONIN I (HIGH SENSITIVITY)    EKG EKG Interpretation  Date/Time:  Monday November 10 2021 19:14:41 EDT Ventricular Rate:  83 PR Interval:  143 QRS Duration: 92 QT Interval:  421 QTC Calculation: 495 R Axis:   39 Text Interpretation: Sinus rhythm Borderline prolonged QT interval Confirmed by Gloris Manchester [phone]) on 11/10/2021 7:27:10 PM  Radiology DG Chest Port 1 View  Result Date: 11/10/2021 CLINICAL DATA:  There syncope. EXAM: PORTABLE CHEST 1 VIEW COMPARISON:  Chest x-ray 09/01/2020 FINDINGS: There is stable mild elevation of the right hemidiaphragm. The heart size and mediastinal contours are within normal limits. Both lungs are clear. The visualized skeletal structures are unremarkable. IMPRESSION: No active disease. Electronically Signed   By: Darliss Cheney M.D.   On: 11/10/2021 19:45    Procedures Procedures    Medications Ordered in ED Medications  potassium chloride 10 mEq in 100 mL IVPB (10 mEq Intravenous New Bag/Given 11/10/21 2136)  lactated ringers bolus 500 mL (0 mLs Intravenous Stopped 11/10/21 2003)  potassium chloride SA (KLOR-CON M) CR tablet 40 mEq (40 mEq Oral Given 11/10/21 2036)  magnesium sulfate IVPB 2 g 50 mL (0 g Intravenous Stopped 11/10/21 2132)    ED Course/ Medical Decision Making/ A&P                           Medical Decision Making Amount and/or Complexity of Data Reviewed Labs: ordered. Radiology:  ordered. ECG/medicine tests: ordered.  Risk Prescription drug management.   This patient presents to the ED for concern of near syncope, this involves an extensive number of treatment options, and is a complaint that carries with it a high risk of complications and morbidity.  The differential diagnosis includes CHF, ACS, vasovagal episode, arrhythmia, hypoglycemia, metabolic derangements   Co morbidities that complicate the patient evaluation  DM, HLD, obesity, depression, HTN, GERD, CKD, anemia, CHF, CAD   Additional history obtained:  Additional history obtained from EMS External records from outside source obtained and reviewed including EMR   Lab Tests:  I Ordered, and personally interpreted labs.  The pertinent results include: Hypokalemia and hypomagnesemia are present.  Baseline anemia, no leukocytosis, normal troponin   Imaging Studies ordered:  I ordered imaging studies including chest x-ray I independently visualized and interpreted imaging which showed no acute findings I agree with the radiologist interpretation   Cardiac Monitoring: / EKG:  The patient was maintained on a cardiac monitor.  I personally viewed and interpreted the cardiac monitored which showed an underlying rhythm of: Sinus rhythm   Problem List / ED Course / Critical interventions / Medication management  Patient is a pleasant 76 year old female presenting for near syncopal symptoms starting this evening.  She reports that she was in her normal state of health earlier today.  She went outside for 10 to 15 minutes.  It was at that point that she felt dizziness, lightheadedness, and generalized weakness.  As she was preparing dinner, she had further symptoms when standing.  EMS was called.  EMS reported no orthostatic hypotension  on scene.  On arrival in the ED, patient endorses continued fatigue.  Vital signs are notable for hypertension.  EKG shows normal sinus rhythm.  On exam, patient has no  focal neurologic deficits.  No cardiac murmurs are appreciated.  Lungs clear to auscultation.  Patient was given IV fluids and laboratory work-up was initiated.  Lab work is notable for hypokalemia of 2.7 and hypomagnesemia.  Replacement electrolytes were initiated in the ED.  Patient's hemoglobin is 7.7, which she states is consistent with her baseline anemia.  Given syncopal episode with her significant cardiac history in addition to the severity of her electrolyte abnormalities, patient to be admitted to hospitalist for further management. I ordered medication including IV fluids for hydration; potassium chloride for hypokalemia; magnesium sulfate for hypomagnesemia Reevaluation of the patient after these medicines showed that the patient improved I have reviewed the patients home medicines and have made adjustments as needed   Social Determinants of Health:  Has access to outpatient care, including cardiology   CRITICAL CARE Performed by: Gloris Manchester   Total critical care time: 35 minutes  Critical care time was exclusive of separately billable procedures and treating other patients.  Critical care was necessary to treat or prevent imminent or life-threatening deterioration.  Critical care was time spent personally by me on the following activities: development of treatment plan with patient and/or surrogate as well as nursing, discussions with consultants, evaluation of patient's response to treatment, examination of patient, obtaining history from patient or surrogate, ordering and performing treatments and interventions, ordering and review of laboratory studies, ordering and review of radiographic studies, pulse oximetry and re-evaluation of patient's condition.         Final Clinical Impression(s) / ED Diagnoses Final diagnoses:  Near syncope  Hypokalemia  Hypomagnesemia    Rx / DC Orders ED Discharge Orders     None         Gloris Manchester, MD 11/10/21 2207

## 2021-11-11 ENCOUNTER — Encounter (HOSPITAL_COMMUNITY): Payer: Self-pay | Admitting: Internal Medicine

## 2021-11-11 ENCOUNTER — Observation Stay (HOSPITAL_BASED_OUTPATIENT_CLINIC_OR_DEPARTMENT_OTHER): Payer: Medicare Other

## 2021-11-11 ENCOUNTER — Other Ambulatory Visit: Payer: Self-pay

## 2021-11-11 ENCOUNTER — Observation Stay (HOSPITAL_COMMUNITY): Payer: Medicare Other

## 2021-11-11 DIAGNOSIS — E1122 Type 2 diabetes mellitus with diabetic chronic kidney disease: Secondary | ICD-10-CM | POA: Diagnosis not present

## 2021-11-11 DIAGNOSIS — R9431 Abnormal electrocardiogram [ECG] [EKG]: Secondary | ICD-10-CM

## 2021-11-11 DIAGNOSIS — E876 Hypokalemia: Secondary | ICD-10-CM

## 2021-11-11 DIAGNOSIS — R55 Syncope and collapse: Secondary | ICD-10-CM

## 2021-11-11 DIAGNOSIS — I130 Hypertensive heart and chronic kidney disease with heart failure and stage 1 through stage 4 chronic kidney disease, or unspecified chronic kidney disease: Secondary | ICD-10-CM | POA: Diagnosis not present

## 2021-11-11 DIAGNOSIS — I2510 Atherosclerotic heart disease of native coronary artery without angina pectoris: Secondary | ICD-10-CM | POA: Diagnosis not present

## 2021-11-11 LAB — ECHOCARDIOGRAM COMPLETE
AR max vel: 2.06 cm2
AV Area VTI: 1.95 cm2
AV Area mean vel: 1.92 cm2
AV Mean grad: 9.8 mmHg
AV Peak grad: 16.6 mmHg
Ao pk vel: 2.04 m/s
Area-P 1/2: 3.87 cm2
Calc EF: 70.7 %
Height: 60 in
MV M vel: 4.86 m/s
MV Peak grad: 94.5 mmHg
Radius: 0.55 cm
S' Lateral: 2.8 cm
Single Plane A2C EF: 73.1 %
Single Plane A4C EF: 68.4 %
Weight: 2469.15 oz

## 2021-11-11 LAB — BASIC METABOLIC PANEL
Anion gap: 4 — ABNORMAL LOW (ref 5–15)
BUN: 5 mg/dL — ABNORMAL LOW (ref 8–23)
CO2: 25 mmol/L (ref 22–32)
Calcium: 7.6 mg/dL — ABNORMAL LOW (ref 8.9–10.3)
Chloride: 115 mmol/L — ABNORMAL HIGH (ref 98–111)
Creatinine, Ser: 0.71 mg/dL (ref 0.44–1.00)
GFR, Estimated: >60 mL/min (ref >60)
Glucose, Bld: 100 mg/dL — ABNORMAL HIGH (ref 70–99)
Potassium: 3.9 mmol/L (ref 3.5–5.1)
Sodium: 144 mmol/L (ref 135–145)

## 2021-11-11 LAB — CBC
HCT: 24.8 % — ABNORMAL LOW (ref 36.0–46.0)
Hemoglobin: 7.4 g/dL — ABNORMAL LOW (ref 12.0–15.0)
MCH: 21.4 pg — ABNORMAL LOW (ref 26.0–34.0)
MCHC: 29.8 g/dL — ABNORMAL LOW (ref 30.0–36.0)
MCV: 71.9 fL — ABNORMAL LOW (ref 80.0–100.0)
Platelets: 194 10*3/uL (ref 150–400)
RBC: 3.45 MIL/uL — ABNORMAL LOW (ref 3.87–5.11)
RDW: 23.7 % — ABNORMAL HIGH (ref 11.5–15.5)
WBC: 4.5 10*3/uL (ref 4.0–10.5)
nRBC: 0 % (ref 0.0–0.2)

## 2021-11-11 LAB — URINALYSIS, ROUTINE W REFLEX MICROSCOPIC
Bilirubin Urine: NEGATIVE
Glucose, UA: NEGATIVE mg/dL
Ketones, ur: NEGATIVE mg/dL
Nitrite: NEGATIVE
Protein, ur: NEGATIVE mg/dL
Specific Gravity, Urine: 1.009 (ref 1.005–1.030)
pH: 7 (ref 5.0–8.0)

## 2021-11-11 LAB — TYPE AND SCREEN
ABO/RH(D): O POS
Antibody Screen: NEGATIVE

## 2021-11-11 LAB — MAGNESIUM: Magnesium: 1.8 mg/dL (ref 1.7–2.4)

## 2021-11-11 LAB — GLUCOSE, CAPILLARY
Glucose-Capillary: 91 mg/dL (ref 70–99)
Glucose-Capillary: 82 mg/dL (ref 70–99)
Glucose-Capillary: 93 mg/dL (ref 70–99)

## 2021-11-11 MED ORDER — HYDRALAZINE HCL 20 MG/ML IJ SOLN: 5.0000 mg | Freq: Four times a day (QID) | INTRAMUSCULAR | Status: DC | PRN

## 2021-11-11 MED ORDER — MAGNESIUM 400 MG PO TABS: 400.0000 mg | ORAL_TABLET | Freq: Every day | ORAL | 0 refills | Status: DC

## 2021-11-11 MED ORDER — INSULIN ASPART 100 UNIT/ML IJ SOLN: 0.0000 [IU] | Freq: Three times a day (TID) | INTRAMUSCULAR | Status: DC

## 2021-11-11 MED ORDER — POTASSIUM CHLORIDE CRYS ER 10 MEQ PO TBCR: 10.0000 meq | EXTENDED_RELEASE_TABLET | Freq: Three times a day (TID) | ORAL | 0 refills | Status: AC

## 2021-11-11 MED ORDER — ACETAMINOPHEN 325 MG PO TABS: 650.0000 mg | ORAL_TABLET | Freq: Four times a day (QID) | ORAL | Status: DC | PRN

## 2021-11-11 MED ORDER — MORPHINE SULFATE (PF) 2 MG/ML IV SOLN
1.0000 mg | Freq: Once | INTRAVENOUS | Status: AC
  Administered 2021-11-11: 1 mg via INTRAVENOUS
  Filled 2021-11-11: qty 1

## 2021-11-11 MED ORDER — INSULIN ASPART 100 UNIT/ML IJ SOLN: 0.0000 [IU] | Freq: Every day | INTRAMUSCULAR | Status: DC

## 2021-11-11 MED ORDER — SODIUM CHLORIDE 0.9 % IV SOLN: INTRAVENOUS | Status: AC

## 2021-11-11 MED ORDER — ACETAMINOPHEN 650 MG RE SUPP: 650.0000 mg | Freq: Four times a day (QID) | RECTAL | Status: DC | PRN

## 2021-11-11 MED ORDER — PANTOPRAZOLE SODIUM 40 MG PO TBEC
40.0000 mg | DELAYED_RELEASE_TABLET | Freq: Two times a day (BID) | ORAL | Status: DC
  Administered 2021-11-11: 40 mg via ORAL
  Filled 2021-11-11: qty 1

## 2021-11-11 NOTE — H&P (Signed)
History and Physical    Chloe Harding BJY:[id number] DOB: [birth year] DOA: 11/10/2021  PCP: Courtney Paris, NP  Patient coming from: Home  Chief Complaint: Near syncope  HPI: Chloe Harding is a 76 y.o. female with medical history significant of chronic combined CHF, hypertension, hyperlipidemia, type II diabetes, rheumatoid arthritis, esophagitis, proximal esophageal stricture status post dilation, hiatal hernia, GERD, history of subdural hematoma presented to the ED via EMS for evaluation of near syncope.  EMS reported no orthostatic hypotension on scene.  In the ED, patient hypertensive with systolic in the 170s to 180s.  Remainder of vital signs stable.  Labs significant for WBC 5.1, hemoglobin 7.7 (previously in the 8-9 range on labs done in April 2022), platelet count 218k.  Sodium 143, potassium 2.7, chloride 111, bicarb 25, BUN 6, creatinine 0.7, glucose 143.  Calcium 8.4 when corrected for hypoalbuminemia.  LFTs normal.  UA pending.  Magnesium 1.5.  High-sensitivity troponin negative x2.  Chest x-ray showing stable mild elevation of the right hemidiaphragm and no active disease. Patient was given 500 cc LR bolus, IV magnesium 2 g, and IV potassium 10 mEq x 5.  Oral potassium 40 mEq was also given and she had much difficulty swallowing it.  She had to drink plenty of water and eat applesauce to push the pill down.  Patient states she went to see her PCP this morning and her blood pressure was normal with SBP in the 130s.  She then returned home and as she was preparing dinner, she felt lightheaded and felt like she was going to pass out so she sat down for a few minutes.  She then got up to start cooking and became symptomatic again.  She did not lose consciousness.  She was not having any shortness of breath or chest pain at that time.  Patient reports chronic difficulty swallowing due to esophageal stricture and is scheduled for dilation on July 7 by Dr. Elnoria Howard.  She is not having any abdominal  pain or diarrhea.  She is taking torsemide 5 mg daily.  No other complaints.  Review of Systems:  Review of Systems  All other systems reviewed and are negative.   Past Medical History:  Diagnosis Date   Aortic atherosclerosis (HCC)    Arthritis    Back pain 02/23/2020   Chronic combined systolic and diastolic CHF (congestive heart failure) (HCC)    Coronary atherosclerosis    Depression    Diabetes mellitus    Esophageal stricture    Fall    Hiatal hernia    Hypercholesteremia    Hypertension    Obesity    Pneumonia    Reflux    Renal disorder    shutting down 4 years ago   Subdural hematoma (HCC)    Thyroid nodule    Urinary retention    UTI due to Klebsiella species     Past Surgical History:  Procedure Laterality Date   BALLOON DILATION N/A 01/25/2020   Procedure: BALLOON DILATION;  Surgeon: Jeani Hawking, MD;  Location: [facility];  Service: Endoscopy;  Laterality: N/A;   BALLOON DILATION N/A 10/10/2021   Procedure: BALLOON DILATION;  Surgeon: Jeani Hawking, MD;  Location: [facility];  Service: Gastroenterology;  Laterality: N/A;   CHOLECYSTECTOMY     COLONOSCOPY WITH PROPOFOL N/A 05/20/2017   Procedure: COLONOSCOPY WITH PROPOFOL;  Surgeon: Jeani Hawking, MD;  Location: [facility] ENDOSCOPY;  Service: Endoscopy;  Laterality: N/A;   ESOPHAGOGASTRODUODENOSCOPY N/A 05/19/2017   Procedure: ESOPHAGOGASTRODUODENOSCOPY (EGD);  Surgeon: Charna Elizabeth, MD;  Location: [facility] ENDOSCOPY;  Service: Endoscopy;  Laterality: N/A;   ESOPHAGOGASTRODUODENOSCOPY (EGD) WITH PROPOFOL N/A 01/17/2019   Procedure: ESOPHAGOGASTRODUODENOSCOPY (EGD) WITH PROPOFOL;  Surgeon: Jeani Hawking, MD;  Location: [facility];  Service: Endoscopy;  Laterality: N/A;   ESOPHAGOGASTRODUODENOSCOPY (EGD) WITH PROPOFOL N/A 01/25/2020   Procedure: ESOPHAGOGASTRODUODENOSCOPY (EGD) WITH PROPOFOL;  Surgeon: Jeani Hawking, MD;  Location: [facility];  Service: Endoscopy;  Laterality: N/A;   ESOPHAGOGASTRODUODENOSCOPY (EGD) WITH  PROPOFOL N/A 07/09/2020   Procedure: ESOPHAGOGASTRODUODENOSCOPY (EGD) WITH PROPOFOL;  Surgeon: Jeani Hawking, MD;  Location: [facility];  Service: Endoscopy;  Laterality: N/A;   ESOPHAGOGASTRODUODENOSCOPY (EGD) WITH PROPOFOL N/A 10/10/2021   Procedure: ESOPHAGOGASTRODUODENOSCOPY (EGD) WITH PROPOFOL;  Surgeon: Jeani Hawking, MD;  Location: [facility];  Service: Gastroenterology;  Laterality: N/A;   SAVORY DILATION N/A 01/17/2019   Procedure: SAVORY DILATION;  Surgeon: Jeani Hawking, MD;  Location: [facility];  Service: Endoscopy;  Laterality: N/A;     reports that she has never smoked. She has never used smokeless tobacco. She reports that she does not drink alcohol and does not use drugs.  Allergies  Allergen Reactions   Amlodipine Besylate Swelling    Norvasc     Family History  Problem Relation Age of Onset   Heart attack Sister    Heart failure Sister    Heart attack Brother    Heart failure Brother     Prior to Admission medications   Medication Sig Start Date End Date Taking? Authorizing Provider  albuterol (VENTOLIN HFA) 108 (90 Base) MCG/ACT inhaler Inhale 1-2 puffs into the lungs every 4 (four) hours as needed for wheezing or shortness of breath. 06/28/20  Yes [provider]  carvedilol (COREG CR) 40 MG 24 hr capsule Take 40 mg by mouth daily.   Yes [provider]  cyclobenzaprine (FLEXERIL) 5 MG tablet Take 5 mg by mouth at bedtime. For restless leg and back pain 08/19/20  Yes [provider]  escitalopram (LEXAPRO) 20 MG tablet Take 20 mg by mouth daily. 06/21/20  Yes [provider]  HYDROcodone-acetaminophen (NORCO) 7.5-325 MG tablet Take 1 tablet by mouth every 4 (four) hours as needed (for leg pain). 08/29/21  Yes [provider]  melatonin 5 MG TABS Take 5 mg by mouth at bedtime.   Yes [provider]  Multiple Vitamins-Minerals (MULTIVITAMIN WITH MINERALS) tablet Take 1 tablet by mouth daily.   Yes [provider]  olmesartan (BENICAR) 20 MG tablet Take 20 mg by mouth daily. 10/17/21  Yes [provider]  pantoprazole (PROTONIX) 40 MG tablet Take 1 tablet (40 mg total) by mouth 2 (two) times daily. 02/18/20  Yes Almon Hercules, MD  ropinirole (REQUIP) 5 MG tablet Take 5 mg by mouth at bedtime. 08/19/20  Yes [provider]  torsemide (DEMADEX) 5 MG tablet Take 5 mg by mouth daily. 09/18/21  Yes [provider]    Physical Exam: Vitals:   11/10/21 2200 11/10/21 2230 11/10/21 2300 11/11/21 0000  BP: (!) 182/94 (!) 175/78 (!) 159/75 (!) 189/84  Pulse: 93 90 85 84  Resp: (!) 25 (!) 22 19 16   Temp:    98.6 F (37 C)  TempSrc:    Oral  SpO2: 99% 100% 100% 100%  Weight:    70 kg    Physical Exam Vitals reviewed.  Constitutional:      General: She is not in acute distress. HENT:     Head: Normocephalic and atraumatic.  Mouth/Throat:     Mouth: Mucous membranes are dry.     Comments: Very dry mucous membranes Eyes:     Extraocular Movements: Extraocular movements intact.  Cardiovascular:     Rate and Rhythm: Normal rate and regular rhythm.     Pulses: Normal pulses.  Pulmonary:     Effort: Pulmonary effort is normal. No respiratory distress.     Breath sounds: Normal breath sounds. No wheezing or rales.  Abdominal:     General: Bowel sounds are normal. There is no distension.     Palpations: Abdomen is soft.     Tenderness: There is no abdominal tenderness.  Musculoskeletal:        General: No swelling or tenderness.     Cervical back: Normal range of motion.  Skin:    General: Skin is warm and dry.  Neurological:     General: No focal deficit present.     Mental Status: She is alert and oriented to person, place, and time.      Labs on Admission: I have personally reviewed following labs and imaging studies  CBC: Recent Labs  Lab 11/10/21 1920  WBC 5.1  NEUTROABS 2.3  HGB 7.7*  HCT 26.5*  MCV 72.4*  PLT 218   Basic Metabolic  Panel: Recent Labs  Lab 11/10/21 1920  NA 143  K 2.7*  CL 111  CO2 25  GLUCOSE 143*  BUN 6*  CREATININE 0.74  CALCIUM 7.8*  MG 1.5*   GFR: Estimated Creatinine Clearance: 53.9 mL/min (by C-G formula based on SCr of 0.74 mg/dL). Liver Function Tests: Recent Labs  Lab 11/10/21 1920  AST 19  ALT 9  ALKPHOS 102  BILITOT 0.5  PROT 6.5  ALBUMIN 3.2*   No results for input(s): "LIPASE", "AMYLASE" in the last 168 hours. No results for input(s): "AMMONIA" in the last 168 hours. Coagulation Profile: No results for input(s): "INR", "PROTIME" in the last 168 hours. Cardiac Enzymes: No results for input(s): "CKTOTAL", "CKMB", "CKMBINDEX", "TROPONINI" in the last 168 hours. BNP (last 3 results) No results for input(s): "PROBNP" in the last 8760 hours. HbA1C: No results for input(s): "HGBA1C" in the last 72 hours. CBG: Recent Labs  Lab 11/10/21 1901  GLUCAP 127*   Lipid Profile: No results for input(s): "CHOL", "HDL", "LDLCALC", "TRIG", "CHOLHDL", "LDLDIRECT" in the last 72 hours. Thyroid Function Tests: No results for input(s): "TSH", "T4TOTAL", "FREET4", "T3FREE", "THYROIDAB" in the last 72 hours. Anemia Panel: No results for input(s): "VITAMINB12", "FOLATE", "FERRITIN", "TIBC", "IRON", "RETICCTPCT" in the last 72 hours. Urine analysis:    Component Value Date/Time   COLORURINE STRAW (A) 09/01/2020 2249   APPEARANCEUR CLEAR 09/01/2020 2249   APPEARANCEUR Clear 02/23/2020 1104   LABSPEC 1.009 09/01/2020 2249   PHURINE 7.0 09/01/2020 2249   GLUCOSEU NEGATIVE 09/01/2020 2249   HGBUR SMALL (A) 09/01/2020 2249   HGBUR trace-intact 05/14/2010 1402   BILIRUBINUR NEGATIVE 09/01/2020 2249   BILIRUBINUR Negative 02/23/2020 1104   KETONESUR NEGATIVE 09/01/2020 2249   PROTEINUR 100 (A) 09/01/2020 2249   UROBILINOGEN 1.0 03/30/2015 1540   NITRITE NEGATIVE 09/01/2020 2249   LEUKOCYTESUR NEGATIVE 09/01/2020 2249    Radiological Exams on Admission: I have personally reviewed  images DG Chest Port 1 View  Result Date: 11/10/2021 CLINICAL DATA:  There syncope. EXAM: PORTABLE CHEST 1 VIEW COMPARISON:  Chest x-ray 09/01/2020 FINDINGS: There is stable mild elevation of the right hemidiaphragm. The heart size and mediastinal contours are within normal limits. Both lungs are clear. The  visualized skeletal structures are unremarkable. IMPRESSION: No active disease. Electronically Signed   By: Darliss Cheney M.D.   On: 11/10/2021 19:45    EKG: Independently reviewed.  Sinus rhythm with borderline QT prolongation.  QT interval increased since prior tracing.  Assessment and Plan  Near syncope Etiology possibly orthostatic as she takes multiple antihypertensives including diuretic.  Appears dehydrated on exam with very dry mucous membranes.  History of esophageal stricture with chronic dysphagia and poor p.o. intake.  Chest x-ray showing no active disease.  Work-up not suggestive of ACS.  PE less likely as she is not tachycardic or hypoxic. -Cardiac monitoring -Gentle IV fluid hydration -Hold home antihypertensives and check orthostatics -Echocardiogram -Stat head CT  Hypokalemia and hypomagnesemia Likely due to diuretic use and poor p.o. intake. -Cardiac monitoring -Monitor potassium and magnesium levels, continue to replace if low.  QT prolongation Likely due to hypokalemia and hypomagnesemia. -Cardiac monitoring -Replace potassium and magnesium, monitor closely -Avoid QT prolonging drugs -Hold home Lexapro at this time and repeat EKG in a.m.  Dysphagia History of esophageal stricture status post dilation Patient had much difficulty swallowing potassium pill in the ED.  She is scheduled for esophageal dilation on 7/7 by Dr. Elnoria Howard. -Keep n.p.o. at this time, SLP eval  Chronic diastolic CHF Echo done in February 2022 showing LVEF 60 to 65%, grade 2 diastolic dysfunction, moderate MVR. No signs of volume overload. -Hold diuretic and monitor volume status  closely  Hypertension Blood pressure elevated. -Hold home antihypertensives and check orthostatics. -Hydralazine prn SBP >180  Diet controlled type 2 diabetes A1c 5.7 on 07/08/2020. -Repeat A1c -Sensitive sliding scale insulin  GERD -Continue Protonix  Chronic anemia No significant change in hemoglobin from baseline.  Not endorsing any symptoms of bleeding. -Type and screen -Monitor H&H -Transfuse PRBCs if hemoglobin less than 7  DVT prophylaxis: SCDs Code Status: Full Code (discussed with the patient) Family Communication: No family available at this time. Level of care: Telemetry bed Admission status: It is my clinical opinion that referral for OBSERVATION is reasonable and necessary in this patient based on the above information provided. The aforementioned taken together are felt to place the patient at high risk for further clinical deterioration. However, it is anticipated that the patient may be medically stable for discharge from the hospital within 24 to 48 hours.   John Giovanni MD [facility]  If 7PM-7AM, please contact night-coverage [email or web address]  11/11/2021, 12:19 AM

## 2021-11-12 ENCOUNTER — Encounter (HOSPITAL_COMMUNITY): Payer: Self-pay | Admitting: Gastroenterology

## 2021-11-12 LAB — HEMOGLOBIN A1C
Hgb A1c MFr Bld: 6.2 % — ABNORMAL HIGH (ref 4.8–5.6)
Mean Plasma Glucose: 131 mg/dL

## 2021-11-12 NOTE — Progress Notes (Signed)
Attempted to obtain medical history via telephone, unable to reach at this time. Unable to leave voicemail to return pre surgical testing department's phone call,due to mailbox full.  

## 2021-11-21 ENCOUNTER — Ambulatory Visit (HOSPITAL_COMMUNITY): Payer: Medicare Other | Admitting: Anesthesiology

## 2021-11-21 ENCOUNTER — Ambulatory Visit (HOSPITAL_BASED_OUTPATIENT_CLINIC_OR_DEPARTMENT_OTHER): Payer: Medicare Other | Admitting: Anesthesiology

## 2021-11-21 ENCOUNTER — Other Ambulatory Visit: Payer: Self-pay

## 2021-11-21 ENCOUNTER — Encounter (HOSPITAL_COMMUNITY)
Admission: RE | Disposition: A | Discharge status: Home / Self Care | Payer: Self-pay | Source: Home / Self Care | Attending: Gastroenterology

## 2021-11-21 ENCOUNTER — Ambulatory Visit (HOSPITAL_COMMUNITY)
Admission: RE | Admit: 2021-11-21 | Discharge: 2021-11-21 | Disposition: A | Discharge status: Home / Self Care | Payer: Medicare Other | Attending: Gastroenterology | Admitting: Gastroenterology

## 2021-11-21 DIAGNOSIS — K222 Esophageal obstruction: Secondary | ICD-10-CM | POA: Diagnosis not present

## 2021-11-21 DIAGNOSIS — K449 Diaphragmatic hernia without obstruction or gangrene: Secondary | ICD-10-CM | POA: Diagnosis not present

## 2021-11-21 DIAGNOSIS — I2510 Atherosclerotic heart disease of native coronary artery without angina pectoris: Secondary | ICD-10-CM

## 2021-11-21 DIAGNOSIS — E119 Type 2 diabetes mellitus without complications: Secondary | ICD-10-CM | POA: Diagnosis not present

## 2021-11-21 DIAGNOSIS — R1310 Dysphagia, unspecified: Secondary | ICD-10-CM | POA: Insufficient documentation

## 2021-11-21 DIAGNOSIS — N289 Disorder of kidney and ureter, unspecified: Secondary | ICD-10-CM | POA: Insufficient documentation

## 2021-11-21 DIAGNOSIS — Z6829 Body mass index (BMI) 29.0-29.9, adult: Secondary | ICD-10-CM | POA: Insufficient documentation

## 2021-11-21 DIAGNOSIS — I5032 Chronic diastolic (congestive) heart failure: Secondary | ICD-10-CM

## 2021-11-21 DIAGNOSIS — I110 Hypertensive heart disease with heart failure: Secondary | ICD-10-CM | POA: Diagnosis not present

## 2021-11-21 DIAGNOSIS — E669 Obesity, unspecified: Secondary | ICD-10-CM | POA: Insufficient documentation

## 2021-11-21 DIAGNOSIS — K219 Gastro-esophageal reflux disease without esophagitis: Secondary | ICD-10-CM | POA: Diagnosis not present

## 2021-11-21 DIAGNOSIS — I5042 Chronic combined systolic (congestive) and diastolic (congestive) heart failure: Secondary | ICD-10-CM | POA: Insufficient documentation

## 2021-11-21 HISTORY — PX: BALLOON DILATION: SHX5330

## 2021-11-21 HISTORY — PX: ESOPHAGOGASTRODUODENOSCOPY (EGD) WITH PROPOFOL: SHX5813

## 2021-11-21 SURGERY — ESOPHAGOGASTRODUODENOSCOPY (EGD) WITH PROPOFOL
Anesthesia: Monitor Anesthesia Care

## 2021-11-21 MED ORDER — LABETALOL HCL 5 MG/ML IV SOLN
5.0000 mg | INTRAVENOUS | Status: DC | PRN
  Administered 2021-11-21 (×2): 5 mg via INTRAVENOUS

## 2021-11-21 MED ORDER — PROPOFOL 500 MG/50ML IV EMUL
INTRAVENOUS | Status: DC | PRN
  Administered 2021-11-21: 150 ug/kg/min via INTRAVENOUS

## 2021-11-21 MED ORDER — PROPOFOL 10 MG/ML IV BOLUS
INTRAVENOUS | Status: DC | PRN
  Administered 2021-11-21 (×2): 20 mg via INTRAVENOUS
  Administered 2021-11-21: 25 mg via INTRAVENOUS
  Administered 2021-11-21: 20 mg via INTRAVENOUS

## 2021-11-21 MED ORDER — SODIUM CHLORIDE 0.9 % IV SOLN: INTRAVENOUS | Status: DC

## 2021-11-21 MED ORDER — LACTATED RINGERS IV SOLN: INTRAVENOUS | Status: DC

## 2021-11-21 MED ORDER — LABETALOL HCL 5 MG/ML IV SOLN
INTRAVENOUS | Status: AC
  Filled 2021-11-21: qty 4

## 2021-11-21 SURGICAL SUPPLY — 15 items

## 2021-11-21 NOTE — Discharge Instructions (Signed)

## 2021-11-21 NOTE — Transfer of Care (Signed)
Immediate Anesthesia Transfer of Care Note  Patient: [name]  Procedure(s) Performed: ESOPHAGOGASTRODUODENOSCOPY (EGD) WITH PROPOFOL BALLOON DILATION  Patient Location: Endoscopy Unit  Anesthesia Type:MAC  Level of Consciousness: awake and patient cooperative  Airway & Oxygen Therapy: Patient Spontanous Breathing and Patient connected to face mask  Post-op Assessment: Report given to RN and Post -op Vital signs reviewed and stable  Post vital signs: Reviewed and stable  Last Vitals:  Vitals Value Taken Time  BP    Temp    Pulse 100 11/21/21 1116  Resp 18 11/21/21 1116  SpO2 100 % 11/21/21 1116  Vitals shown include unvalidated device data.  Last Pain:  Vitals:   11/21/21 1016  TempSrc: Temporal  PainSc: 0-No pain         Complications: No notable events documented.

## 2021-11-21 NOTE — Anesthesia Postprocedure Evaluation (Signed)
Anesthesia Post Note  Patient: Chloe Harding  Procedure(s) Performed: ESOPHAGOGASTRODUODENOSCOPY (EGD) WITH PROPOFOL BALLOON DILATION     Patient location during evaluation: Endoscopy Anesthesia Type: MAC Level of consciousness: awake and alert Pain management: pain level controlled Vital Signs Assessment: post-procedure vital signs reviewed and stable Respiratory status: spontaneous breathing, nonlabored ventilation and respiratory function stable Cardiovascular status: stable and blood pressure returned to baseline Postop Assessment: no apparent nausea or vomiting Anesthetic complications: no   No notable events documented.  Last Vitals:  Vitals:   11/21/21 1200 11/21/21 1205  BP: (!) 184/84 (!) 188/80  Pulse: 75 77  Resp: 18 17  Temp:    SpO2: 98% 98%    Last Pain:  Vitals:   11/21/21 1205  TempSrc:   PainSc: 0-No pain                 Krysti Hickling,W. EDMOND

## 2021-11-21 NOTE — Anesthesia Preprocedure Evaluation (Addendum)
Anesthesia Evaluation  Patient identified by MRN, date of birth, ID band Patient awake    Reviewed: Allergy & Precautions, NPO status , Patient's Chart, lab work & pertinent test results, reviewed documented beta blocker date and time   History of Anesthesia Complications Negative for: history of anesthetic complications  Airway Mallampati: II  TM Distance: >3 FB Neck ROM: Full    Dental  (+) Poor Dentition, Missing,    Pulmonary neg pulmonary ROS,    Pulmonary exam normal        Cardiovascular hypertension, Pt. on medications and Pt. on home beta blockers + CAD and +CHF  Normal cardiovascular exam  TTE 11/11/21: EF 60-65%, grade II diastolic dysfunction, moderate LAE, mild MR/MS    Neuro/Psych Anxiety Depression negative neurological ROS     GI/Hepatic Neg liver ROS, hiatal hernia, GERD  Medicated and Controlled,Dysphagia, Esophageal stricture   Endo/Other  diabetes, Type 2  Renal/GU Renal InsufficiencyRenal disease  negative genitourinary   Musculoskeletal  (+) Arthritis ,   Abdominal   Peds  Hematology  (+) Blood dyscrasia (Hgb 7.4), anemia ,   Anesthesia Other Findings Day of surgery medications reviewed with patient.  Reproductive/Obstetrics negative OB ROS                            Anesthesia Physical Anesthesia Plan  ASA: 3  Anesthesia Plan: MAC   Post-op Pain Management: Minimal or no pain anticipated   Induction:   PONV Risk Score and Plan: Treatment may vary due to age or medical condition and Propofol infusion  Airway Management Planned: Natural Airway and Nasal Cannula  Additional Equipment: None  Intra-op Plan:   Post-operative Plan:   Informed Consent: I have reviewed the patients History and Physical, chart, labs and discussed the procedure including the risks, benefits and alternatives for the proposed anesthesia with the patient or authorized representative  who has indicated his/her understanding and acceptance.       Plan Discussed with: CRNA  Anesthesia Plan Comments:        Anesthesia Quick Evaluation

## 2021-11-21 NOTE — H&P (Signed)
Chloe Harding HPI: The patient is better from the recent dilation, but she still suffers with dysphagia.  Past Medical History:  Diagnosis Date   Aortic atherosclerosis ([place])    Arthritis    Back pain 02/23/2020   Chronic combined systolic and diastolic CHF (congestive heart failure) ([employer])    Coronary atherosclerosis    Depression    Diabetes mellitus    Esophageal stricture    Fall    Hiatal hernia    Hypercholesteremia    Hypertension    Obesity    Pneumonia    Reflux    Renal disorder    shutting down 4 years ago   Subdural hematoma ([place])    Thyroid nodule    Urinary retention    UTI due to Klebsiella species     Past Surgical History:  Procedure Laterality Date   BALLOON DILATION N/A 01/25/2020   Procedure: BALLOON DILATION;  Surgeon: Carol Ada, MD;  Location: [facility] ENDOSCOPY;  Service: Endoscopy;  Laterality: N/A;   BALLOON DILATION N/A 10/10/2021   Procedure: BALLOON DILATION;  Surgeon: Carol Ada, MD;  Location: [facility];  Service: Gastroenterology;  Laterality: N/A;   CHOLECYSTECTOMY     COLONOSCOPY WITH PROPOFOL N/A 05/20/2017   Procedure: COLONOSCOPY WITH PROPOFOL;  Surgeon: Carol Ada, MD;  Location: [place];  Service: Endoscopy;  Laterality: N/A;   ESOPHAGOGASTRODUODENOSCOPY N/A 05/19/2017   Procedure: ESOPHAGOGASTRODUODENOSCOPY (EGD);  Surgeon: Juanita Craver, MD;  Location: [facility] ENDOSCOPY;  Service: Endoscopy;  Laterality: N/A;   ESOPHAGOGASTRODUODENOSCOPY (EGD) WITH PROPOFOL N/A 01/17/2019   Procedure: ESOPHAGOGASTRODUODENOSCOPY (EGD) WITH PROPOFOL;  Surgeon: Carol Ada, MD;  Location: [facility];  Service: Endoscopy;  Laterality: N/A;   ESOPHAGOGASTRODUODENOSCOPY (EGD) WITH PROPOFOL N/A 01/25/2020   Procedure: ESOPHAGOGASTRODUODENOSCOPY (EGD) WITH PROPOFOL;  Surgeon: Carol Ada, MD;  Location: [facility];  Service: Endoscopy;  Laterality: N/A;   ESOPHAGOGASTRODUODENOSCOPY (EGD) WITH PROPOFOL N/A 07/09/2020   Procedure: ESOPHAGOGASTRODUODENOSCOPY  (EGD) WITH PROPOFOL;  Surgeon: Carol Ada, MD;  Location: [facility];  Service: Endoscopy;  Laterality: N/A;   ESOPHAGOGASTRODUODENOSCOPY (EGD) WITH PROPOFOL N/A 10/10/2021   Procedure: ESOPHAGOGASTRODUODENOSCOPY (EGD) WITH PROPOFOL;  Surgeon: Carol Ada, MD;  Location: [facility];  Service: Gastroenterology;  Laterality: N/A;   SAVORY DILATION N/A 01/17/2019   Procedure: SAVORY DILATION;  Surgeon: Carol Ada, MD;  Location: [facility];  Service: Endoscopy;  Laterality: N/A;    Family History  Problem Relation Age of Onset   Heart attack Sister    Heart failure Sister    Heart attack Brother    Heart failure Brother     Social History:  reports that she has never smoked. She has never used smokeless tobacco. She reports that she does not drink alcohol and does not use drugs.  Allergies:  Allergies  Allergen Reactions   Amlodipine Besylate Swelling    Norvasc     Medications: Scheduled: Continuous:  sodium chloride     lactated ringers 10 mL/hr at 11/21/21 1055    No results found for this or any previous visit (from the past 24 hour(s)).   No results found.  ROS:  As stated above in the HPI otherwise negative.  Blood pressure (!) 239/96, pulse 92, temperature 97.7 F (36.5 C), temperature source Temporal, resp. rate 17, height 5' (1.524 m), weight 67.6 kg, SpO2 100 %.    PE: Gen: NAD, Alert and Oriented HEENT:  [place]/AT, EOMI Neck: Supple, no LAD Lungs: CTA Bilaterally CV: RRR without M/G/R ABD: Soft, NTND, +BS Ext: No C/C/E  Assessment/Plan: 1) Esophageal stenosis - EGD with dilation.  Jerzey Komperda D 11/21/2021, 10:56 AM

## 2021-11-21 NOTE — Op Note (Signed)
[facility] Patient Name: Chloe Harding Procedure Date: 11/21/2021 MRN: [id number] Attending MD: Carol Ada , MD Date of Birth: [birth year] CSN: [id number] Age: 76 Admit Type: Outpatient Procedure:                Upper GI endoscopy Indications:              Dysphagia Providers:                Carol Ada, MD, Benay Pillow, RN, Frazier Richards,                            Technician Referring MD:              Medicines:                Propofol per Anesthesia Complications:            No immediate complications. Estimated Blood Loss:     Estimated blood loss: none. Procedure:                Pre-Anesthesia Assessment:                           - Prior to the procedure, a History and Physical                            was performed, and patient medications and                            allergies were reviewed. The patient's tolerance of                            previous anesthesia was also reviewed. The risks                            and benefits of the procedure and the sedation                            options and risks were discussed with the patient.                            All questions were answered, and informed consent                            was obtained. Prior Anticoagulants: The patient has                            taken no previous anticoagulant or antiplatelet                            agents. ASA Grade Assessment: III - A patient with                            severe systemic disease. After reviewing the risks                            and benefits,  the patient was deemed in                            satisfactory condition to undergo the procedure.                           - Sedation was administered by an anesthesia                            professional. Deep sedation was attained.                           After obtaining informed consent, the endoscope was                            passed under direct vision. Throughout the                             procedure, the patient's blood pressure, pulse, and                            oxygen saturations were monitored continuously. The                            GIF-H190 ([id number]) Olympus endoscope was introduced                            through the mouth, and advanced to the second part                            of duodenum. The upper GI endoscopy was                            accomplished without difficulty. The patient                            tolerated the procedure well. Scope In: Scope Out: Findings:      One benign-appearing, intrinsic severe stenosis was found 30 cm from the       incisors. This stenosis measured 9 mm (inner diameter) x 1 cm (in       length). The stenosis was traversed after dilation. A TTS dilator was       passed through the scope. Dilation with a 12-13.5-15 mm balloon dilator       was performed to 13 mm. The dilation site was examined and showed       complete resolution of luminal narrowing. Estimated blood loss was       minimal.      A 7 cm hiatal hernia was present.      The stomach was normal.      The examined duodenum was normal.      The eccentric strictures were again noted in the upper esophagus, but       there were not obstructing. The stricture at 30 cm was too stenosed to       allow passage of the endoscope. Dilation commenced at 12 mm and the  balloon was held in place. After this dilation diameter the endoscope       was able to pass through the stricture. Further dilation was performed       at 13 mm, which was 4 ATMs. An excellent mucosal disruption was achieved. Impression:               - Benign-appearing esophageal stenosis. Dilated.                           - 7 cm hiatal hernia.                           - Normal stomach.                           - Normal examined duodenum.                           - No specimens collected. Moderate Sedation:      Not Applicable - Patient had care per  Anesthesia. Recommendation:           - Patient has a contact number available for                            emergencies. The signs and symptoms of potential                            delayed complications were discussed with the                            patient. Return to normal activities tomorrow.                            Written discharge instructions were provided to the                            patient.                           - Resume previous diet.                           - Continue present medications.                           - Repeat upper endoscopy in 2-3 weeks for                            retreatment. Procedure Code(s):        --- Professional ---                           [id number], Esophagogastroduodenoscopy, flexible,                            transoral; with transendoscopic balloon dilation of  esophagus (less than 30 mm diameter) Diagnosis Code(s):        --- Professional ---                           K22.2, Esophageal obstruction                           K44.9, Diaphragmatic hernia without obstruction or                            gangrene                           R13.10, Dysphagia, unspecified CPT copyright 2019 [employer]. All rights reserved. The codes documented in this report are preliminary and upon coder review may  be revised to meet current compliance requirements. Carol Ada, MD Carol Ada, MD 11/21/2021 11:19:39 AM This report has been signed electronically. Number of Addenda: 0

## 2021-11-25 ENCOUNTER — Encounter (HOSPITAL_COMMUNITY): Payer: Self-pay | Admitting: Gastroenterology

## 2021-12-31 ENCOUNTER — Other Ambulatory Visit: Payer: Self-pay | Admitting: Gastroenterology

## 2022-01-01 ENCOUNTER — Encounter (HOSPITAL_COMMUNITY): Payer: Self-pay | Admitting: Gastroenterology

## 2022-01-01 NOTE — Progress Notes (Signed)
Attempted to obtain medical history via telephone, unable to reach at this time. Unable to leave voicemail to return pre surgical testing department's phone call,due to mailbox full.  

## 2022-01-02 ENCOUNTER — Ambulatory Visit (HOSPITAL_COMMUNITY): Payer: Medicare Other | Admitting: Certified Registered"

## 2022-01-02 ENCOUNTER — Ambulatory Visit (HOSPITAL_COMMUNITY)
Admission: RE | Admit: 2022-01-02 | Discharge: 2022-01-02 | Disposition: A | Discharge status: Home / Self Care | Payer: Medicare Other | Attending: Gastroenterology | Admitting: Gastroenterology

## 2022-01-02 ENCOUNTER — Ambulatory Visit (HOSPITAL_BASED_OUTPATIENT_CLINIC_OR_DEPARTMENT_OTHER): Payer: Medicare Other | Admitting: Certified Registered"

## 2022-01-02 ENCOUNTER — Encounter (HOSPITAL_COMMUNITY): Payer: Self-pay | Admitting: Gastroenterology

## 2022-01-02 ENCOUNTER — Encounter (HOSPITAL_COMMUNITY)
Admission: RE | Disposition: A | Discharge status: Home / Self Care | Payer: Self-pay | Source: Home / Self Care | Attending: Gastroenterology

## 2022-01-02 ENCOUNTER — Other Ambulatory Visit: Payer: Self-pay

## 2022-01-02 DIAGNOSIS — K219 Gastro-esophageal reflux disease without esophagitis: Secondary | ICD-10-CM | POA: Diagnosis not present

## 2022-01-02 DIAGNOSIS — I110 Hypertensive heart disease with heart failure: Secondary | ICD-10-CM | POA: Insufficient documentation

## 2022-01-02 DIAGNOSIS — I2510 Atherosclerotic heart disease of native coronary artery without angina pectoris: Secondary | ICD-10-CM | POA: Insufficient documentation

## 2022-01-02 DIAGNOSIS — K449 Diaphragmatic hernia without obstruction or gangrene: Secondary | ICD-10-CM | POA: Diagnosis not present

## 2022-01-02 DIAGNOSIS — K222 Esophageal obstruction: Secondary | ICD-10-CM

## 2022-01-02 DIAGNOSIS — I5032 Chronic diastolic (congestive) heart failure: Secondary | ICD-10-CM

## 2022-01-02 DIAGNOSIS — I5042 Chronic combined systolic (congestive) and diastolic (congestive) heart failure: Secondary | ICD-10-CM | POA: Insufficient documentation

## 2022-01-02 HISTORY — PX: ESOPHAGOGASTRODUODENOSCOPY (EGD) WITH PROPOFOL: SHX5813

## 2022-01-02 HISTORY — PX: BALLOON DILATION: SHX5330

## 2022-01-02 SURGERY — ESOPHAGOGASTRODUODENOSCOPY (EGD) WITH PROPOFOL
Anesthesia: Monitor Anesthesia Care

## 2022-01-02 MED ORDER — LACTATED RINGERS IV SOLN: INTRAVENOUS | Status: DC | PRN

## 2022-01-02 MED ORDER — PROPOFOL 500 MG/50ML IV EMUL
INTRAVENOUS | Status: DC | PRN
  Administered 2022-01-02: 150 ug/kg/min via INTRAVENOUS

## 2022-01-02 MED ORDER — LIDOCAINE 2% (20 MG/ML) 5 ML SYRINGE
INTRAMUSCULAR | Status: DC | PRN
  Administered 2022-01-02: 80 mg via INTRAVENOUS

## 2022-01-02 MED ORDER — SODIUM CHLORIDE 0.9 % IV SOLN: INTRAVENOUS | Status: DC

## 2022-01-02 MED ORDER — PROPOFOL 10 MG/ML IV BOLUS
INTRAVENOUS | Status: DC | PRN
  Administered 2022-01-02: 10 mg via INTRAVENOUS
  Administered 2022-01-02: 20 mg via INTRAVENOUS

## 2022-01-02 MED ORDER — PROPOFOL 500 MG/50ML IV EMUL
INTRAVENOUS | Status: AC
  Filled 2022-01-02: qty 50

## 2022-01-02 SURGICAL SUPPLY — 15 items

## 2022-01-02 NOTE — Op Note (Signed)
[facility] Patient Name: Chloe Harding Procedure Date: 01/02/2022 MRN: [id number] Attending MD: Carol Ada , MD Date of Birth: 16-May-1946 CSN: [id number] Age: 76 Admit Type: Outpatient Procedure:                Upper GI endoscopy Indications:              Therapeutic procedure Providers:                Carol Ada, MD, Allayne Gitelman, RN, Gloris Ham, Technician Referring MD:              Medicines:                Propofol per Anesthesia Complications:            No immediate complications. Estimated Blood Loss:     Estimated blood loss: none. Procedure:                Pre-Anesthesia Assessment:                           - Prior to the procedure, a History and Physical                            was performed, and patient medications and                            allergies were reviewed. The patient's tolerance of                            previous anesthesia was also reviewed. The risks                            and benefits of the procedure and the sedation                            options and risks were discussed with the patient.                            All questions were answered, and informed consent                            was obtained. Prior Anticoagulants: The patient has                            taken no previous anticoagulant or antiplatelet                            agents. ASA Grade Assessment: III - A patient with                            severe systemic disease. After reviewing the risks                            and  benefits, the patient was deemed in                            satisfactory condition to undergo the procedure.                           - Sedation was administered by an anesthesia                            professional. Deep sedation was attained.                           After obtaining informed consent, the endoscope was                            passed under direct vision.  Throughout the                            procedure, the patient's blood pressure, pulse, and                            oxygen saturations were monitored continuously. The                            GIF-H190 ([id number]) Olympus endoscope was introduced                            through the mouth, and advanced to the second part                            of duodenum. The upper GI endoscopy was                            accomplished without difficulty. The patient                            tolerated the procedure well. Scope In: Scope Out: Findings:      One benign-appearing, intrinsic severe stenosis was found 30 cm from the       incisors. This stenosis measured 9 mm (inner diameter) x 1 cm (in       length). The stenosis was traversed after dilation. A TTS dilator was       passed through the scope. Dilation with a 12-13.5-15 mm balloon dilator       was performed to 13.5 mm. The dilation site was examined and showed       complete resolution of luminal narrowing. Estimated blood loss was       minimal.      A 4 cm hiatal hernia was present.      The stomach was normal.      The examined duodenum was normal.      The endoscope was not able to traverse the stenosis. Dilation started at       12 mm, but the balloon freely moved. The diameter was increased slowly       to 13.5 mm and held in place for 1 minute. A  good mucosal break was       achieved and the endoscope freely moved through the area. Impression:               - Benign-appearing esophageal stenosis. Dilated.                           - 4 cm hiatal hernia.                           - Normal stomach.                           - Normal examined duodenum.                           - No specimens collected. Moderate Sedation:      Not Applicable - Patient had care per Anesthesia. Recommendation:           - Patient has a contact number available for                            emergencies. The signs and symptoms of potential                             delayed complications were discussed with the                            patient. Return to normal activities tomorrow.                            Written discharge instructions were provided to the                            patient.                           - Resume previous diet.                           - Continue present medications.                           - Repeat upper endoscopy in 2-3 weeks for                            retreatment. Procedure Code(s):        --- Professional ---                           [phone], Esophagogastroduodenoscopy, flexible,                            transoral; with transendoscopic balloon dilation of                            esophagus (less than 30 mm diameter) Diagnosis Code(s):        --- Professional ---  K22.2, Esophageal obstruction                           K44.9, Diaphragmatic hernia without obstruction or                            gangrene CPT copyright 2019 [employer] Association. All rights reserved. The codes documented in this report are preliminary and upon coder review may  be revised to meet current compliance requirements. Carol Ada, MD Carol Ada, MD 01/02/2022 12:52:58 PM This report has been signed electronically. Number of Addenda: 0

## 2022-01-02 NOTE — Discharge Instructions (Signed)

## 2022-01-02 NOTE — Transfer of Care (Signed)
Immediate Anesthesia Transfer of Care Note  Patient: Chloe Harding  Procedure(s) Performed: ESOPHAGOGASTRODUODENOSCOPY (EGD) WITH PROPOFOL BALLOON DILATION  Patient Location: PACU and Endoscopy Unit  Anesthesia Type:MAC  Level of Consciousness: awake, drowsy and patient cooperative  Airway & Oxygen Therapy: Patient Spontanous Breathing and Patient connected to face mask oxygen  Post-op Assessment: Report given to RN and Post -op Vital signs reviewed and stable  Post vital signs: Reviewed and stable  Last Vitals:  Vitals Value Taken Time  BP 138/67 01/02/22 1253  Temp    Pulse 79 01/02/22 1253  Resp 19 01/02/22 1253  SpO2 100 % 01/02/22 1253    Last Pain:  Vitals:   01/02/22 1253  TempSrc:   PainSc: 0-No pain         Complications: No notable events documented.

## 2022-01-02 NOTE — H&P (Signed)
Chloe Harding HPI: Chloe Harding is here for continued treatment of her esophageal stricture.  Past Medical History:  Diagnosis Date   Aortic atherosclerosis ([place])    Arthritis    Back pain 02/23/2020   Chronic combined systolic and diastolic CHF (congestive heart failure) ([place])    Coronary atherosclerosis    Depression    Diabetes mellitus    Esophageal stricture    Fall    Hiatal hernia    Hypercholesteremia    Hypertension    Obesity    Pneumonia    Reflux    Renal disorder    shutting down 4 years ago   Subdural hematoma ([place])    Thyroid nodule    Urinary retention    UTI due to Klebsiella species     Past Surgical History:  Procedure Laterality Date   BALLOON DILATION N/A 01/25/2020   Procedure: BALLOON DILATION;  Surgeon: Carol Ada, MD;  Location: [facility] ENDOSCOPY;  Service: Endoscopy;  Laterality: N/A;   BALLOON DILATION N/A 10/10/2021   Procedure: BALLOON DILATION;  Surgeon: Carol Ada, MD;  Location: [facility];  Service: Gastroenterology;  Laterality: N/A;   BALLOON DILATION N/A 11/21/2021   Procedure: BALLOON DILATION;  Surgeon: Carol Ada, MD;  Location: [facility];  Service: Gastroenterology;  Laterality: N/A;   CHOLECYSTECTOMY     COLONOSCOPY WITH PROPOFOL N/A 05/20/2017   Procedure: COLONOSCOPY WITH PROPOFOL;  Surgeon: Carol Ada, MD;  Location: [facility];  Service: Endoscopy;  Laterality: N/A;   ESOPHAGOGASTRODUODENOSCOPY N/A 05/19/2017   Procedure: ESOPHAGOGASTRODUODENOSCOPY (EGD);  Surgeon: Juanita Craver, MD;  Location: [facility] ENDOSCOPY;  Service: Endoscopy;  Laterality: N/A;   ESOPHAGOGASTRODUODENOSCOPY (EGD) WITH PROPOFOL N/A 01/17/2019   Procedure: ESOPHAGOGASTRODUODENOSCOPY (EGD) WITH PROPOFOL;  Surgeon: Carol Ada, MD;  Location: [facility]OS[facility];  Service: Endoscopy;  Laterality: N/A;   ESOPHAGOGASTRODUODENOSCOPY (EGD) WITH PROPOFOL N/A 01/25/2020   Procedure: ESOPHAGOGASTRODUODENOSCOPY (EGD) WITH PROPOFOL;  Surgeon: Carol Ada, MD;  Location: [facility]  ENDOSCOPY;  Service: Endoscopy;  Laterality: N/A;   ESOPHAGOGASTRODUODENOSCOPY (EGD) WITH PROPOFOL N/A 07/09/2020   Procedure: ESOPHAGOGASTRODUODENOSCOPY (EGD) WITH PROPOFOL;  Surgeon: Carol Ada, MD;  Location: [facility];  Service: Endoscopy;  Laterality: N/A;   ESOPHAGOGASTRODUODENOSCOPY (EGD) WITH PROPOFOL N/A 10/10/2021   Procedure: ESOPHAGOGASTRODUODENOSCOPY (EGD) WITH PROPOFOL;  Surgeon: Carol Ada, MD;  Location: [facility] END[facility]COPY;  Service: Gastroenterology;  Laterality: N/A;   ESOPHAGOGASTRODUODENOSCOPY (EGD) WITH PROPOFOL N/A 11/21/2021   Procedure: ESOPHAGOGASTRODUODENOSCOPY (EGD) WITH PROPOFOL;  Surgeon: Carol Ada, MD;  Location: [facility];  Service: Gastroenterology;  Laterality: N/A;   SAVORY DILATION N/A 01/17/2019   Procedure: SAVORY DILATION;  Surgeon: Carol Ada, MD;  Location: [facility] END[facility]COPY;  Service: Endoscopy;  Laterality: N/A;    Family History  Problem Relation Age of Onset   Heart attack Sister    Heart failure Sister    Heart attack Brother    Heart failure Brother     Social History:  reports that she has never smoked. She has never used smokeless tobacco. She reports that she does not drink alcohol and does not use drugs.  Allergies:  Allergies  Allergen Reactions   Amlodipine Besylate Swelling    Norvasc     Medications: Scheduled: Continuous:  sodium chloride      No results found for this or any previous visit (from the past 24 hour(s)).   No results found.  ROS:  As stated above in the HPI otherwise negative.  Blood pressure (!) 194/97, pulse 86, temperature (!) 97.5 F (36.4 C), temperature source Temporal,  resp. rate 19, height 5' (1.524 m), weight 66.7 kg, SpO2 100 %.    PE: Gen: NAD, Alert and Oriented HEENT:  [place]/AT, EOMI Neck: Supple, no LAD Lungs: CTA Bilaterally CV: RRR without M/G/R ABD: Soft, NTND, +BS Ext: No C/C/E  Assessment/Plan: 1) Esophageal stricture - EGD with balloon dilation.  Chloe Harding D 01/02/2022,  11:50 AM

## 2022-01-02 NOTE — Anesthesia Preprocedure Evaluation (Addendum)
Anesthesia Evaluation  Patient identified by MRN, date of birth, ID band Patient awake    Reviewed: Allergy & Precautions, NPO status , Patient's Chart, lab work & pertinent test results  Airway Mallampati: II  TM Distance: >3 FB Neck ROM: Full    Dental  (+) Chipped, Missing, Poor Dentition   Pulmonary neg pulmonary ROS,    Pulmonary exam normal        Cardiovascular hypertension, Pt. on home beta blockers + CAD and +CHF  Normal cardiovascular exam     Neuro/Psych PSYCHIATRIC DISORDERS Anxiety Depression negative neurological ROS     GI/Hepatic Neg liver ROS, hiatal hernia, GERD  Medicated and Controlled,  Endo/Other  diabetes  Renal/GU Renal disease     Musculoskeletal  (+) Arthritis ,   Abdominal   Peds  Hematology negative hematology ROS (+)   Anesthesia Other Findings dyphagia  Reproductive/Obstetrics                            Anesthesia Physical Anesthesia Plan  ASA: 3  Anesthesia Plan: MAC   Post-op Pain Management:    Induction: Intravenous  PONV Risk Score and Plan: 2 and Propofol infusion and Treatment may vary due to age or medical condition  Airway Management Planned: Nasal Cannula  Additional Equipment:   Intra-op Plan:   Post-operative Plan:   Informed Consent: I have reviewed the patients History and Physical, chart, labs and discussed the procedure including the risks, benefits and alternatives for the proposed anesthesia with the patient or authorized representative who has indicated his/her understanding and acceptance.     Dental advisory given  Plan Discussed with: CRNA  Anesthesia Plan Comments:        Anesthesia Quick Evaluation

## 2022-01-02 NOTE — Anesthesia Postprocedure Evaluation (Signed)
Anesthesia Post Note  Patient: Chloe Harding  Procedure(s) Performed: ESOPHAGOGASTRODUODENOSCOPY (EGD) WITH PROPOFOL BALLOON DILATION     Patient location during evaluation: Endoscopy Anesthesia Type: MAC Level of consciousness: awake Pain management: pain level controlled Vital Signs Assessment: post-procedure vital signs reviewed and stable Respiratory status: spontaneous breathing, nonlabored ventilation, respiratory function stable and patient connected to nasal cannula oxygen Cardiovascular status: stable and blood pressure returned to baseline Postop Assessment: no apparent nausea or vomiting Anesthetic complications: no   No notable events documented.  Last Vitals:  Vitals:   01/02/22 1310 01/02/22 1320  BP: (!) 186/65 (!) 177/69  Pulse: 73 75  Resp: 15 20  Temp:    SpO2: 100% 99%    Last Pain:  Vitals:   01/02/22 1320  TempSrc:   PainSc: 0-No pain                 Payzlee Ryder P Cecil Vandyke

## 2022-01-02 NOTE — Anesthesia Procedure Notes (Signed)
Procedure Name: MAC Date/Time: 01/02/2022 12:32 PM  Performed by: Eben Burow, CRNAPre-anesthesia Checklist: Patient identified, Emergency Drugs available, Suction available, Patient being monitored and Timeout performed Oxygen Delivery Method: Simple face mask Placement Confirmation: positive ETCO2

## 2022-01-04 ENCOUNTER — Encounter (HOSPITAL_COMMUNITY): Payer: Self-pay | Admitting: Gastroenterology

## 2022-02-11 ENCOUNTER — Other Ambulatory Visit: Payer: Self-pay | Admitting: Gastroenterology

## 2022-02-13 ENCOUNTER — Ambulatory Visit (HOSPITAL_COMMUNITY): Payer: Medicare HMO | Admitting: Registered Nurse

## 2022-02-13 ENCOUNTER — Encounter (HOSPITAL_COMMUNITY)
Admission: RE | Disposition: A | Discharge status: Home / Self Care | Payer: Self-pay | Source: Home / Self Care | Attending: Gastroenterology

## 2022-02-13 ENCOUNTER — Ambulatory Visit (HOSPITAL_COMMUNITY)
Admission: RE | Admit: 2022-02-13 | Discharge: 2022-02-13 | Disposition: A | Discharge status: Home / Self Care | Payer: Medicare HMO | Attending: Gastroenterology | Admitting: Gastroenterology

## 2022-02-13 ENCOUNTER — Ambulatory Visit (HOSPITAL_BASED_OUTPATIENT_CLINIC_OR_DEPARTMENT_OTHER): Payer: Medicare HMO | Admitting: Registered Nurse

## 2022-02-13 ENCOUNTER — Other Ambulatory Visit: Payer: Self-pay

## 2022-02-13 DIAGNOSIS — I110 Hypertensive heart disease with heart failure: Secondary | ICD-10-CM

## 2022-02-13 DIAGNOSIS — I5032 Chronic diastolic (congestive) heart failure: Secondary | ICD-10-CM

## 2022-02-13 DIAGNOSIS — K449 Diaphragmatic hernia without obstruction or gangrene: Secondary | ICD-10-CM | POA: Diagnosis not present

## 2022-02-13 DIAGNOSIS — K222 Esophageal obstruction: Secondary | ICD-10-CM | POA: Insufficient documentation

## 2022-02-13 DIAGNOSIS — I2510 Atherosclerotic heart disease of native coronary artery without angina pectoris: Secondary | ICD-10-CM | POA: Diagnosis not present

## 2022-02-13 HISTORY — PX: BALLOON DILATION: SHX5330

## 2022-02-13 HISTORY — PX: ESOPHAGOGASTRODUODENOSCOPY (EGD) WITH PROPOFOL: SHX5813

## 2022-02-13 SURGERY — ESOPHAGOGASTRODUODENOSCOPY (EGD) WITH PROPOFOL
Anesthesia: Monitor Anesthesia Care

## 2022-02-13 MED ORDER — SODIUM CHLORIDE 0.9 % IV SOLN: INTRAVENOUS | Status: DC

## 2022-02-13 MED ORDER — PROPOFOL 1000 MG/100ML IV EMUL
INTRAVENOUS | Status: AC
  Filled 2022-02-13: qty 100

## 2022-02-13 MED ORDER — OXYCODONE HCL 5 MG/5ML PO SOLN: 5.0000 mg | Freq: Once | ORAL | Status: DC | PRN

## 2022-02-13 MED ORDER — LACTATED RINGERS IV SOLN: INTRAVENOUS | Status: DC

## 2022-02-13 MED ORDER — HYDROMORPHONE HCL 1 MG/ML IJ SOLN: 0.2500 mg | INTRAMUSCULAR | Status: DC | PRN

## 2022-02-13 MED ORDER — PROPOFOL 500 MG/50ML IV EMUL
INTRAVENOUS | Status: DC | PRN
  Administered 2022-02-13: 10 mg via INTRAVENOUS
  Administered 2022-02-13: 30 mg via INTRAVENOUS
  Administered 2022-02-13: 150 ug/kg/min via INTRAVENOUS

## 2022-02-13 MED ORDER — OXYCODONE HCL 5 MG PO TABS: 5.0000 mg | ORAL_TABLET | Freq: Once | ORAL | Status: DC | PRN

## 2022-02-13 SURGICAL SUPPLY — 15 items

## 2022-02-13 NOTE — Discharge Instructions (Signed)

## 2022-02-13 NOTE — Anesthesia Preprocedure Evaluation (Signed)
Anesthesia Evaluation  Patient identified by MRN, date of birth, ID band Patient awake    Reviewed: Allergy & Precautions, NPO status , Patient's Chart, lab work & pertinent test results  Airway Mallampati: II  TM Distance: >3 FB Neck ROM: Full    Dental  (+) Chipped, Missing, Poor Dentition   Pulmonary neg pulmonary ROS,    Pulmonary exam normal        Cardiovascular hypertension, Pt. on home beta blockers + CAD and +CHF  Normal cardiovascular exam     Neuro/Psych PSYCHIATRIC DISORDERS Anxiety Depression negative neurological ROS     GI/Hepatic Neg liver ROS, hiatal hernia, GERD  Medicated and Controlled,  Endo/Other  diabetes  Renal/GU Renal disease     Musculoskeletal  (+) Arthritis , Osteoarthritis,    Abdominal   Peds  Hematology negative hematology ROS (+)   Anesthesia Other Findings dyphagia  Reproductive/Obstetrics                             Anesthesia Physical  Anesthesia Plan  ASA: 3  Anesthesia Plan: MAC   Post-op Pain Management: Minimal or no pain anticipated   Induction: Intravenous  PONV Risk Score and Plan: 2 and Propofol infusion, Treatment may vary due to age or medical condition and Ondansetron  Airway Management Planned: Nasal Cannula  Additional Equipment:   Intra-op Plan:   Post-operative Plan:   Informed Consent: I have reviewed the patients History and Physical, chart, labs and discussed the procedure including the risks, benefits and alternatives for the proposed anesthesia with the patient or authorized representative who has indicated his/her understanding and acceptance.     Dental advisory given  Plan Discussed with: CRNA  Anesthesia Plan Comments:         Anesthesia Quick Evaluation

## 2022-02-13 NOTE — Anesthesia Postprocedure Evaluation (Signed)
Anesthesia Post Note  Patient: Chloe Harding  Procedure(s) Performed: ESOPHAGOGASTRODUODENOSCOPY (EGD) WITH PROPOFOL BALLOON DILATION     Patient location during evaluation: PACU Anesthesia Type: MAC Level of consciousness: awake and alert Pain management: pain level controlled Vital Signs Assessment: post-procedure vital signs reviewed and stable Respiratory status: spontaneous breathing, nonlabored ventilation and respiratory function stable Cardiovascular status: blood pressure returned to baseline and stable Postop Assessment: no apparent nausea or vomiting Anesthetic complications: no   No notable events documented.  Last Vitals:  Vitals:   02/13/22 0855 02/13/22 0900  BP:  (!) 164/72  Pulse: 84 86  Resp: (!) 22 (!) 21  Temp:    SpO2: 100% 100%    Last Pain:  Vitals:   02/13/22 0900  TempSrc:   PainSc: 0-No pain                 Lynda Rainwater

## 2022-02-13 NOTE — Op Note (Signed)
[facility] Patient Name: Chloe Harding Procedure Date: 02/13/2022 MRN: [id number] Attending MD: Carol Ada , MD Date of Birth: 08-30-1945 CSN: [id number] Age: 76 Admit Type: Outpatient Procedure:                Upper GI endoscopy Indications:              Therapeutic procedure Providers:                Carol Ada, MD, Dulcy Fanny, Luan Moore, Technician, Brien Mates, RNFA Referring MD:              Medicines:                 Complications:            No immediate complications. Estimated Blood Loss:     Estimated blood loss: none. Procedure:                Pre-Anesthesia Assessment:                           - Prior to the procedure, a History and Physical                            was performed, and patient medications and                            allergies were reviewed. The patient's tolerance of                            previous anesthesia was also reviewed. The risks                            and benefits of the procedure and the sedation                            options and risks were discussed with the patient.                            All questions were answered, and informed consent                            was obtained. Prior Anticoagulants: The patient has                            taken no previous anticoagulant or antiplatelet                            agents. ASA Grade Assessment: III - A patient with                            severe systemic disease. After reviewing the risks                            and  benefits, the patient was deemed in                            satisfactory condition to undergo the procedure.                           - Sedation was administered by an anesthesia                            professional. Deep sedation was attained.                           After obtaining informed consent, the endoscope was                            passed under direct vision.  Throughout the                            procedure, the patient's blood pressure, pulse, and                            oxygen saturations were monitored continuously. The                            GIF-H190 ([id number]) Olympus endoscope was introduced                            through the mouth, and advanced to the second part                            of duodenum. The upper GI endoscopy was                            accomplished without difficulty. The patient                            tolerated the procedure well. Scope In: Scope Out: Findings:      Two benign-appearing, intrinsic moderate stenoses were found in the       middle third of the esophagus. The narrowest stenosis measured 9 mm       (inner diameter) x 1 cm (in length). The stenoses were traversed. A TTS       dilator was passed through the scope. Dilation with a 12-13.5-15 mm       balloon dilator was performed to 15 mm. The dilation site was examined       and showed complete resolution of luminal narrowing. Estimated blood       loss was minimal.      A 3 cm hiatal hernia was present.      The gastroesophageal flap valve was visualized endoscopically and       classified as Hill Grade IV (no fold, wide open lumen, hiatal hernia       present).      The stomach was normal.      The examined duodenum was normal.      A nonobstructive stenosis was found at 21 cm.  The more severe stenosis       was found at 27 cm. The endoscope was not able to initially pass, but       with gentle forward pressure and tip deflection the endoscope traverse       the stenosis. Balloon dilation was performed starting at 12 mm, but       there was free movement of the balloon. At 13.5 mm there was still some       movement and the balloon diameter was slowly increased by .5 ATMs.       Constant observation of the mucosal tears were performed. At 15 mm (8       ATMs) the balloon was held in place for 1 minute. Free movement of the        endoscope was achieved after the dilation. Impression:               - Benign-appearing esophageal stenoses. Dilated.                           - 3 cm hiatal hernia.                           - Gastroesophageal flap valve classified as Hill                            Grade IV (no fold, wide open lumen, hiatal hernia                            present).                           - Normal stomach.                           - Normal examined duodenum.                           - No specimens collected. Moderate Sedation:      Not Applicable - Patient had care per Anesthesia. Recommendation:           - Patient has a contact number available for                            emergencies. The signs and symptoms of potential                            delayed complications were discussed with the                            patient. Return to normal activities tomorrow.                            Written discharge instructions were provided to the                            patient.                           -  Resume previous diet.                           - Continue present medications.                           - Repeat upper endoscopy in 4 weeks for retreatment. Procedure Code(s):        --- Professional ---                           [id number], Esophagogastroduodenoscopy, flexible,                            transoral; with transendoscopic balloon dilation of                            esophagus (less than 30 mm diameter) Diagnosis Code(s):        --- Professional ---                           K22.2, Esophageal obstruction                           K44.9, Diaphragmatic hernia without obstruction or                            gangrene CPT copyright 2019 [employer]. All rights reserved. The codes documented in this report are preliminary and upon coder review may  be revised to meet current compliance requirements. Carol Ada, MD Carol Ada, MD 02/13/2022 8:45:49 AM This  report has been signed electronically. Number of Addenda: 0

## 2022-02-13 NOTE — Transfer of Care (Signed)
Immediate Anesthesia Transfer of Care Note  Patient: Chloe Harding  Procedure(s) Performed: ESOPHAGOGASTRODUODENOSCOPY (EGD) WITH PROPOFOL BALLOON DILATION  Patient Location: PACU and Endoscopy Unit  Anesthesia Type:MAC  Level of Consciousness: awake, alert , oriented and patient cooperative  Airway & Oxygen Therapy: Patient Spontanous Breathing and Patient connected to face mask oxygen  Post-op Assessment: Report given to RN, Post -op Vital signs reviewed and stable and Patient moving all extremities  Post vital signs: Reviewed and stable  Last Vitals:  Vitals Value Taken Time  BP 139/62 02/13/22 0844  Temp    Pulse 87 02/13/22 0846  Resp 27 02/13/22 0846  SpO2 100 % 02/13/22 0846  Vitals shown include unvalidated device data.  Last Pain:  Vitals:   02/13/22 0757  TempSrc: Temporal  PainSc: 0-No pain         Complications: No notable events documented.

## 2022-02-13 NOTE — H&P (Signed)
Chloe Harding HPI: The patient is here for repeat esophageal stricture dilation.  Past Medical History:  Diagnosis Date   Aortic atherosclerosis ([place])    Arthritis    Back pain 02/23/2020   Chronic combined systolic and diastolic CHF (congestive heart failure) ([place])    Coronary atherosclerosis    Depression    Diabetes mellitus    Esophageal stricture    Fall    Hiatal hernia    Hypercholesteremia    Hypertension    Obesity    Pneumonia    Reflux    Renal disorder    shutting down 4 years ago   Subdural hematoma ([place])    Thyroid nodule    Urinary retention    UTI due to Klebsiella species     Past Surgical History:  Procedure Laterality Date   BALLOON DILATION N/A 01/25/2020   Procedure: BALLOON DILATION;  Surgeon: Carol Ada, MD;  Location: [facility] END[facility];  Service: Endoscopy;  Laterality: N/A;   BALLOON DILATION N/A 10/10/2021   Procedure: BALLOON DILATION;  Surgeon: Carol Ada, MD;  Location: [facility];  Service: Gastroenterology;  Laterality: N/A;   BALLOON DILATION N/A 11/21/2021   Procedure: BALLOON DILATION;  Surgeon: Carol Ada, MD;  Location: [facility];  Service: Gastroenterology;  Laterality: N/A;   BALLOON DILATION N/A 01/02/2022   Procedure: BALLOON DILATION;  Surgeon: Carol Ada, MD;  Location: [facility];  Service: Gastroenterology;  Laterality: N/A;   CHOLECYSTECTOMY     COLONOSCOPY WITH PROPOFOL N/A 05/20/2017   Procedure: COLONOSCOPY WITH PROPOFOL;  Surgeon: Carol Ada, MD;  Location: [facility];  Service: Endoscopy;  Laterality: N/A;   ESOPHAGOGASTRODUODENOSCOPY N/A 05/19/2017   Procedure: ESOPHAGOGASTRODUODENOSCOPY (EGD);  Surgeon: Juanita Craver, MD;  Location: [facility] ENDOSCOPY;  Service: Endoscopy;  Laterality: N/A;   ESOPHAGOGASTRODUODENOSCOPY (EGD) WITH PROPOFOL N/A 01/17/2019   Procedure: ESOPHAGOGASTRODUODENOSCOPY (EGD) WITH PROPOFOL;  Surgeon: Carol Ada, MD;  Location: [facility];  Service: Endoscopy;  Laterality: N/A;    ESOPHAGOGASTRODUODENOSCOPY (EGD) WITH PROPOFOL N/A 01/25/2020   Procedure: ESOPHAGOGASTRODUODENOSCOPY (EGD) WITH PROPOFOL;  Surgeon: Carol Ada, MD;  Location: [facility];  Service: Endoscopy;  Laterality: N/A;   ESOPHAGOGASTRODUODENOSCOPY (EGD) WITH PROPOFOL N/A 07/09/2020   Procedure: ESOPHAGOGASTRODUODENOSCOPY (EGD) WITH PROPOFOL;  Surgeon: Carol Ada, MD;  Location: [facility];  Service: Endoscopy;  Laterality: N/A;   ESOPHAGOGASTRODUODENOSCOPY (EGD) WITH PROPOFOL N/A 10/10/2021   Procedure: ESOPHAGOGASTRODUODENOSCOPY (EGD) WITH PROPOFOL;  Surgeon: Carol Ada, MD;  Location: [facility];  Service: Gastroenterology;  Laterality: N/A;   ESOPHAGOGASTRODUODENOSCOPY (EGD) WITH PROPOFOL N/A 11/21/2021   Procedure: ESOPHAGOGASTRODUODENOSCOPY (EGD) WITH PROPOFOL;  Surgeon: Carol Ada, MD;  Location: [facility];  Service: Gastroenterology;  Laterality: N/A;   ESOPHAGOGASTRODUODENOSCOPY (EGD) WITH PROPOFOL N/A 01/02/2022   Procedure: ESOPHAGOGASTRODUODENOSCOPY (EGD) WITH PROPOFOL;  Surgeon: Carol Ada, MD;  Location: [facility];  Service: Gastroenterology;  Laterality: N/A;   SAVORY DILATION N/A 01/17/2019   Procedure: SAVORY DILATION;  Surgeon: Carol Ada, MD;  Location: [facility];  Service: Endoscopy;  Laterality: N/A;    Family History  Problem Relation Age of Onset   Heart attack Sister    Heart failure Sister    Heart attack Brother    Heart failure Brother     Social History:  reports that she has never smoked. She has never used smokeless tobacco. She reports that she does not drink alcohol and does not use drugs.  Allergies:  Allergies  Allergen Reactions   Amlodipine Besylate Swelling    Norvasc     Medications: Scheduled: Continuous:  sodium chloride      No results found for this or any previous visit (from the past 24 hour(s)).   No results found.  ROS:  As stated above in the HPI otherwise negative.  Blood pressure (!) 200/87, pulse 86,  temperature (!) 97.4 F (36.3 C), temperature source Temporal, resp. rate 18, height 5' (1.524 m), weight 65.8 kg, SpO2 100 %.    PE: Gen: NAD, Alert and Oriented HEENT:  [place]/AT, EOMI Neck: Supple, no LAD Lungs: CTA Bilaterally CV: RRR without M/G/R ABD: Soft, NTND, +BS Ext: No C/C/E  Assessment/Plan: 1) Esophageal stricture - EGD with dilation.  Jaydrien Wassenaar D 02/13/2022, 8:02 AM

## 2022-02-16 ENCOUNTER — Encounter (HOSPITAL_COMMUNITY): Payer: Self-pay | Admitting: Gastroenterology

## 2022-06-14 ENCOUNTER — Emergency Department (HOSPITAL_COMMUNITY)
Admission: EM | Admit: 2022-06-14 | Discharge: 2022-06-14 | Disposition: A | Discharge status: Home / Self Care | Payer: Medicare HMO | Attending: Emergency Medicine | Admitting: Emergency Medicine

## 2022-06-14 ENCOUNTER — Emergency Department (HOSPITAL_COMMUNITY): Payer: Medicare HMO

## 2022-06-14 ENCOUNTER — Other Ambulatory Visit: Payer: Self-pay

## 2022-06-14 DIAGNOSIS — R55 Syncope and collapse: Secondary | ICD-10-CM | POA: Insufficient documentation

## 2022-06-14 DIAGNOSIS — R531 Weakness: Secondary | ICD-10-CM | POA: Diagnosis present

## 2022-06-14 DIAGNOSIS — Z20822 Contact with and (suspected) exposure to covid-19: Secondary | ICD-10-CM | POA: Diagnosis not present

## 2022-06-14 LAB — BASIC METABOLIC PANEL
Anion gap: 9 (ref 5–15)
BUN: 8 mg/dL (ref 8–23)
CO2: 22 mmol/L (ref 22–32)
Calcium: 8.1 mg/dL — ABNORMAL LOW (ref 8.9–10.3)
Chloride: 105 mmol/L (ref 98–111)
Creatinine, Ser: 0.98 mg/dL (ref 0.44–1.00)
GFR, Estimated: >60 mL/min (ref >60)
Glucose, Bld: 114 mg/dL — ABNORMAL HIGH (ref 70–99)
Potassium: 4.7 mmol/L (ref 3.5–5.1)
Sodium: 136 mmol/L (ref 135–145)

## 2022-06-14 LAB — RESP PANEL BY RT-PCR (RSV, FLU A&B, COVID)  RVPGX2
Influenza A by PCR: NEGATIVE
Influenza B by PCR: NEGATIVE
Resp Syncytial Virus by PCR: NEGATIVE
SARS Coronavirus 2 by RT PCR: NEGATIVE

## 2022-06-14 LAB — URINALYSIS, ROUTINE W REFLEX MICROSCOPIC
Bilirubin Urine: NEGATIVE
Glucose, UA: NEGATIVE mg/dL
Hgb urine dipstick: NEGATIVE
Ketones, ur: NEGATIVE mg/dL
Leukocytes,Ua: NEGATIVE
Nitrite: NEGATIVE
Protein, ur: NEGATIVE mg/dL
Specific Gravity, Urine: 1.008 (ref 1.005–1.030)
pH: 7 (ref 5.0–8.0)

## 2022-06-14 LAB — CBC
HCT: 28.8 % — ABNORMAL LOW (ref 36.0–46.0)
Hemoglobin: 9.5 g/dL — ABNORMAL LOW (ref 12.0–15.0)
MCH: 31.8 pg (ref 26.0–34.0)
MCHC: 33 g/dL (ref 30.0–36.0)
MCV: 96.3 fL (ref 80.0–100.0)
Platelets: 259 10*3/uL (ref 150–400)
RBC: 2.99 MIL/uL — ABNORMAL LOW (ref 3.87–5.11)
RDW: 17.3 % — ABNORMAL HIGH (ref 11.5–15.5)
WBC: 3.9 10*3/uL — ABNORMAL LOW (ref 4.0–10.5)
nRBC: 0 % (ref 0.0–0.2)

## 2022-06-14 LAB — CBG MONITORING, ED: Glucose-Capillary: 110 mg/dL — ABNORMAL HIGH (ref 70–99)

## 2022-06-14 NOTE — Discharge Instructions (Signed)
Return for any problem.  ?

## 2022-06-14 NOTE — ED Provider Notes (Signed)
[place] EMERGENCY DEPARTMENT AT [facility] Provider Note   CSN: [id number] Arrival date & time: 06/14/22  1730     History  Chief Complaint  Patient presents with   Weakness    Chloe Harding is a 77 y.o. female.  77 year old female with prior medical history as detailed below presents for evaluation.  Patient reports intermittent weakness.  Symptoms have been ongoing for several weeks she reports.  She reports that once or twice a day she will feel very weak.  She reports symptoms of near syncope.  She has not experienced actual syncope.  She denies associated chest pain, palpitations, nausea, vomiting, focal weakness, visual changes, speech changes, fever, other complaint.  She reports feeling fine on evaluation.  She has not yet discussed her symptoms with her regular primary care providers.    The history is provided by the patient and medical records.       Home Medications Prior to Admission medications   Medication Sig Start Date End Date Taking? Authorizing Provider  albuterol (VENTOLIN HFA) 108 (90 Base) MCG/ACT inhaler Inhale 1-2 puffs into the lungs every 4 (four) hours as needed for wheezing or shortness of breath. 06/28/20   [provider]  carvedilol (COREG CR) 40 MG 24 hr capsule Take 40 mg by mouth daily.    [provider]  cyclobenzaprine (FLEXERIL) 5 MG tablet Take 5 mg by mouth at bedtime. For restless leg and back pain 08/19/20   [provider]  escitalopram (LEXAPRO) 20 MG tablet Take 20 mg by mouth daily. 06/21/20   [provider]  Magnesium 400 MG TABS Take 400 mg by mouth daily. 11/11/21   Barb Merino, MD  melatonin 5 MG TABS Take 5 mg by mouth at bedtime.    [provider]  Multiple Vitamins-Minerals (MULTIVITAMIN WITH MINERALS) tablet Take 1 tablet by mouth daily.    [provider]  pantoprazole (PROTONIX) 40 MG tablet Take 1 tablet (40 mg total) by mouth 2 (two) times daily. 02/18/20    Mercy Riding, MD  potassium chloride (KLOR-CON M) 10 MEQ tablet Take 1 tablet (10 mEq total) by mouth 3 (three) times daily for 14 days. 11/11/21 11/25/21  Barb Merino, MD  ropinirole (REQUIP) 5 MG tablet Take 5 mg by mouth at bedtime. 08/19/20   [provider]  torsemide (DEMADEX) 5 MG tablet Take 5 mg by mouth daily. 09/18/21   [provider]      Allergies    Amlodipine besylate    Review of Systems   Review of Systems  All other systems reviewed and are negative.   Physical Exam Updated Vital Signs BP (!) 154/85   Pulse (!) 114   Temp 98.2 F (36.8 C) (Oral)   Resp 18   Ht 5' (1.524 m)   Wt 58.1 kg   SpO2 96%   BMI 25.00 kg/m  Physical Exam Vitals and nursing note reviewed.  Constitutional:      General: She is not in acute distress.    Appearance: Normal appearance. She is well-developed.  HENT:     Head: Normocephalic and atraumatic.  Eyes:     Conjunctiva/sclera: Conjunctivae normal.     Pupils: Pupils are equal, round, and reactive to light.  Cardiovascular:     Rate and Rhythm: Normal rate and regular rhythm.     Heart sounds: Normal heart sounds.  Pulmonary:     Effort: Pulmonary effort is normal. No respiratory distress.  Breath sounds: Normal breath sounds.  Abdominal:     General: There is no distension.     Palpations: Abdomen is soft.     Tenderness: There is no abdominal tenderness.  Musculoskeletal:        General: No deformity. Normal range of motion.     Cervical back: Normal range of motion and neck supple.  Skin:    General: Skin is warm and dry.  Neurological:     General: No focal deficit present.     Mental Status: She is alert and oriented to person, place, and time. Mental status is at baseline.     Cranial Nerves: No cranial nerve deficit.     Sensory: No sensory deficit.     Motor: No weakness.     Coordination: Coordination normal.     ED Results / Procedures / Treatments   Labs (all labs ordered are  listed, but only abnormal results are displayed) Labs Reviewed  BASIC METABOLIC PANEL - Abnormal; Notable for the following components:      Result Value   Glucose, Bld 114 (*)    Calcium 8.1 (*)    All other components within normal limits  CBC - Abnormal; Notable for the following components:   WBC 3.9 (*)    RBC 2.99 (*)    Hemoglobin 9.5 (*)    HCT 28.8 (*)    RDW 17.3 (*)    All other components within normal limits  CBG MONITORING, ED - Abnormal; Notable for the following components:   Glucose-Capillary 110 (*)    All other components within normal limits  RESP PANEL BY RT-PCR (RSV, FLU A&B, COVID)  [id number]  URINALYSIS, ROUTINE W REFLEX MICROSCOPIC    EKG EKG Interpretation  Date/Time:  Sunday June 14 2022 17:47:56 EST Ventricular Rate:  102 PR Interval:  120 QRS Duration: 90 QT Interval:  351 QTC Calculation: 458 R Axis:   16 Text Interpretation: Sinus tachycardia Low voltage, precordial leads Confirmed by Dene Gentry [phone]) on 06/14/2022 6:09:26 PM  Radiology DG Chest Portable 1 View  Result Date: 06/14/2022 CLINICAL DATA:  Weakness. EXAM: PORTABLE CHEST 1 VIEW COMPARISON:  November 10, 2021. FINDINGS: The heart size and mediastinal contours are within normal limits. Both lungs are clear. Stable elevated right hemidiaphragm. The visualized skeletal structures are unremarkable. IMPRESSION: No active disease. Electronically Signed   By: Marijo Conception M.D.   On: 06/14/2022 18:20    Procedures Procedures    Medications Ordered in ED Medications - No data to display  ED Course/ Medical Decision Making/ A&P                             Medical Decision Making Amount and/or Complexity of Data Reviewed Labs: ordered.    Medical Screen Complete  This patient presented to the ED with complaint of intermittent weakness.  This complaint involves an extensive number of treatment options. The initial differential diagnosis includes, but is not limited to,  metabolic abnormality, dehydration, hypokalemia, etc.  This presentation is: Acute, Chronic, Self-Limited, Previously Undiagnosed, Uncertain Prognosis, Complicated, Systemic Symptoms, and Threat to Life/Bodily Function  Patient is presenting with complaint of intermittent weakness.  This has been ongoing for several weeks.  Patient without specific inciting events.  Patient without reported episodes of syncope.  Screening labs obtained are without significant abnormality.  Patient does have established PCP.  Patient is comfortable with plan for discharge with plan to follow-up  tomorrow with her PCP.  Importance of close follow-up stressed.  Strict return precautions given and understood.  Additional history obtained: External records from outside sources obtained and reviewed including prior ED visits and prior Inpatient records.    Lab Tests:  I ordered and personally interpreted labs.  The pertinent results include: CBC, BMP, CBG, COVID, flu, UA   Imaging Studies ordered:  I ordered imaging studies including chest x-ray I independently visualized and interpreted obtained imaging which showed NAD I agree with the radiologist interpretation.   Cardiac Monitoring:  The patient was maintained on a cardiac monitor.  I personally viewed and interpreted the cardiac monitor which showed an underlying rhythm of: Sinus tach  Problem List / ED Course:  Weakness   Reevaluation:  After the interventions noted above, I reevaluated the patient and found that they have: improved   Disposition:  After consideration of the diagnostic results and the patients response to treatment, I feel that the patent would benefit from close outpatient follow-up.          Final Clinical Impression(s) / ED Diagnoses Final diagnoses:  Weakness    Rx / DC Orders ED Discharge Orders     None         Valarie Merino, MD 06/14/22 2147

## 2022-06-14 NOTE — ED Triage Notes (Signed)
Pt BIBA from home. Pt c/o an episode of weakness this afternoon. Pt states this has been happening almost daily for the past few weeks. Pt reports recent 20 lb weightloss  Aox4  BP: 110/66 Hr: 100 Spo2: 97 RA CBG: 191

## 2022-06-30 ENCOUNTER — Emergency Department (HOSPITAL_COMMUNITY)
Admission: EM | Admit: 2022-06-30 | Discharge: 2022-06-30 | Disposition: A | Discharge status: Home / Self Care | Payer: Medicare HMO | Attending: Emergency Medicine | Admitting: Emergency Medicine

## 2022-06-30 ENCOUNTER — Other Ambulatory Visit: Payer: Self-pay

## 2022-06-30 ENCOUNTER — Encounter (HOSPITAL_COMMUNITY): Payer: Self-pay

## 2022-06-30 ENCOUNTER — Emergency Department (HOSPITAL_COMMUNITY): Payer: Medicare HMO

## 2022-06-30 DIAGNOSIS — I130 Hypertensive heart and chronic kidney disease with heart failure and stage 1 through stage 4 chronic kidney disease, or unspecified chronic kidney disease: Secondary | ICD-10-CM | POA: Diagnosis not present

## 2022-06-30 DIAGNOSIS — I2510 Atherosclerotic heart disease of native coronary artery without angina pectoris: Secondary | ICD-10-CM | POA: Insufficient documentation

## 2022-06-30 DIAGNOSIS — Z79899 Other long term (current) drug therapy: Secondary | ICD-10-CM | POA: Diagnosis not present

## 2022-06-30 DIAGNOSIS — E119 Type 2 diabetes mellitus without complications: Secondary | ICD-10-CM | POA: Diagnosis not present

## 2022-06-30 DIAGNOSIS — I5043 Acute on chronic combined systolic (congestive) and diastolic (congestive) heart failure: Secondary | ICD-10-CM | POA: Insufficient documentation

## 2022-06-30 DIAGNOSIS — N181 Chronic kidney disease, stage 1: Secondary | ICD-10-CM | POA: Diagnosis not present

## 2022-06-30 DIAGNOSIS — R55 Syncope and collapse: Secondary | ICD-10-CM | POA: Diagnosis not present

## 2022-06-30 LAB — COMPREHENSIVE METABOLIC PANEL
ALT: 11 U/L (ref 0–44)
AST: 22 U/L (ref 15–41)
Albumin: 2.2 g/dL — ABNORMAL LOW (ref 3.5–5.0)
Alkaline Phosphatase: 103 U/L (ref 38–126)
Anion gap: 6 (ref 5–15)
BUN: 10 mg/dL (ref 8–23)
CO2: 22 mmol/L (ref 22–32)
Calcium: 7.8 mg/dL — ABNORMAL LOW (ref 8.9–10.3)
Chloride: 109 mmol/L (ref 98–111)
Creatinine, Ser: 0.8 mg/dL (ref 0.44–1.00)
GFR, Estimated: >60 mL/min (ref >60)
Glucose, Bld: 97 mg/dL (ref 70–99)
Potassium: 4.1 mmol/L (ref 3.5–5.1)
Sodium: 137 mmol/L (ref 135–145)
Total Bilirubin: 0.9 mg/dL (ref 0.3–1.2)
Total Protein: 5.7 g/dL — ABNORMAL LOW (ref 6.5–8.1)

## 2022-06-30 LAB — CBC WITH DIFFERENTIAL/PLATELET
Abs Immature Granulocytes: 0.01 10*3/uL (ref 0.00–0.07)
Basophils Absolute: 0 10*3/uL (ref 0.0–0.1)
Basophils Relative: 1 %
Eosinophils Absolute: 0.1 10*3/uL (ref 0.0–0.5)
Eosinophils Relative: 3 %
HCT: 30.5 % — ABNORMAL LOW (ref 36.0–46.0)
Hemoglobin: 9.9 g/dL — ABNORMAL LOW (ref 12.0–15.0)
Immature Granulocytes: 0 %
Lymphocytes Relative: 41 %
Lymphs Abs: 1.8 10*3/uL (ref 0.7–4.0)
MCH: 31.9 pg (ref 26.0–34.0)
MCHC: 32.5 g/dL (ref 30.0–36.0)
MCV: 98.4 fL (ref 80.0–100.0)
Monocytes Absolute: 0.2 10*3/uL (ref 0.1–1.0)
Monocytes Relative: 5 %
Neutro Abs: 2.2 10*3/uL (ref 1.7–7.7)
Neutrophils Relative %: 50 %
Platelets: 199 10*3/uL (ref 150–400)
RBC: 3.1 MIL/uL — ABNORMAL LOW (ref 3.87–5.11)
RDW: 14.6 % (ref 11.5–15.5)
WBC: 4.5 10*3/uL (ref 4.0–10.5)
nRBC: 0 % (ref 0.0–0.2)

## 2022-06-30 LAB — URINALYSIS, ROUTINE W REFLEX MICROSCOPIC
Bilirubin Urine: NEGATIVE
Glucose, UA: NEGATIVE mg/dL
Hgb urine dipstick: NEGATIVE
Ketones, ur: NEGATIVE mg/dL
Leukocytes,Ua: NEGATIVE
Nitrite: NEGATIVE
Protein, ur: NEGATIVE mg/dL
Specific Gravity, Urine: 1.006 (ref 1.005–1.030)
pH: 7 (ref 5.0–8.0)

## 2022-06-30 LAB — TROPONIN I (HIGH SENSITIVITY)
Troponin I (High Sensitivity): 5 ng/L (ref <18)
Troponin I (High Sensitivity): 4 ng/L (ref <18)

## 2022-06-30 LAB — BRAIN NATRIURETIC PEPTIDE: B Natriuretic Peptide: 352.5 pg/mL — ABNORMAL HIGH (ref 0.0–100.0)

## 2022-06-30 LAB — PROTIME-INR
INR: 1.2 (ref 0.8–1.2)
Prothrombin Time: 15.2 seconds (ref 11.4–15.2)

## 2022-06-30 MED ORDER — SODIUM CHLORIDE 0.9 % IV BOLUS
1000.0000 mL | Freq: Once | INTRAVENOUS | Status: AC
  Administered 2022-06-30: 1000 mL via INTRAVENOUS

## 2022-06-30 NOTE — ED Notes (Signed)
ED Provider at bedside. 

## 2022-06-30 NOTE — ED Provider Notes (Signed)
[facility] Provider Note  CSN: [id number] Arrival date & time: 06/30/22 [id number]  Chief Complaint(s) Loss of Consciousness  HPI Chloe Harding is a 77 y.o. female history of mild diastolic CHF, hypertension, hyperlipidemia presenting to the emergency department for syncope.  Patient was at [facility], reports she is feeling weak after walking around, felt lightheaded, sat down in a chair and then lost consciousness.  Patient was unresponsive reportedly per bystanders.  On EMS arrival patient was awake and alert.  Patient did not eat this morning.  She denies other symptoms such as chest pain, shortness of breath, headaches, nausea or vomiting, abdominal pain, melena, hematochezia, hematemesis.  She reports she has felt weak in the home recently.  She was seen in the emergency department on end of January for similar symptoms without syncope and was referred to her primary doctor.  She reports she was supposed to see a doctor but was not able to follow-up today, did not have enough money to pay for doctor visit.  Past Medical History Past Medical History:  Diagnosis Date   Aortic atherosclerosis ([place])    Arthritis    Back pain 02/23/2020   Chronic combined systolic and diastolic CHF (congestive heart failure) ([place])    Coronary atherosclerosis    Depression    Diabetes mellitus    Esophageal stricture    Fall    Hiatal hernia    Hypercholesteremia    Hypertension    Obesity    Pneumonia    Reflux    Renal disorder    shutting down 4 years ago   Subdural hematoma ([facility])    Thyroid nodule    Urinary retention    UTI due to Klebsiella species    Patient Active Problem List   Diagnosis Date Noted   Hypokalemia 11/11/2021   QT prolongation 11/11/2021   Near syncope 11/10/2021   Nausea & vomiting 09/03/2020   Depression 09/03/2020   Anxiety 09/03/2020   RLS (restless legs syndrome) 09/03/2020   History of esophagitis 09/03/2020   Chronic  pain 09/02/2020   Aspiration pneumonia ([place]) 07/08/2020   Esophageal dilatation 07/08/2020   Hypomagnesemia    Acute respiratory failure ([place]) 07/07/2020   Hypertensive emergency 07/07/2020   Acute on chronic combined systolic and diastolic CHF (congestive heart failure) ([place]) 07/07/2020   Back pain 02/23/2020   Tachycardia 02/15/2020   Fall    Neurocognitive deficits 01/30/2020   AKI (acute kidney injury) ([place]) 01/19/2020   Acute encephalopathy 01/18/2020   Diarrhea 12/23/2019   SOB (shortness of breath)    Demand ischemia    CAD in native artery    Pneumonia 11/05/2019   Hyponatremia 11/05/2019   Acute CHF (congestive heart failure) ([place]) 11/05/2019   Esophageal stricture    Dysphagia 01/15/2019   Chronic anemia 05/18/2017   Acute-on-chronic kidney injury ([place]) 05/18/2017   Hypertension 05/18/2017   Hiatal hernia 05/18/2017   History of esophageal stricture 05/18/2017   Pure hypercholesterolemia 05/18/2017   Diet-controlled diabetes mellitus ([place]) 05/18/2017   Solitary thyroid nodule 05/18/2017   Hypotension 03/30/2015   Sepsis ([place]) 03/30/2015   CAP (community acquired pneumonia) 03/30/2015   Hyperkalemia 03/30/2015   Mild renal insufficiency 03/30/2015   Nausea with vomiting 03/30/2015   Diabetic neuropathy ([place]) 03/30/2015   OBESITY 03/11/2010   Diabetes with neurologic complications ([place]) [id number]   Dyslipidemia 12/26/2009   Major depression, recurrent ([place]) 12/26/2009   HYPERTENSION, BENIGN ESSENTIAL 12/26/2009   GERD 12/26/2009  HIATAL HERNIA 12/26/2009   CHRONIC KIDNEY DISEASE STAGE I 12/26/2009   UTI due to Klebsiella species 12/26/2009   Arthropathy 12/26/2009   ARTHROSCOPY, RIGHT KNEE, HX OF 12/26/2009   Home Medication(s) Prior to Admission medications   Medication Sig Start Date End Date Taking? Authorizing Provider  albuterol (VENTOLIN HFA) 108 (90 Base) MCG/ACT inhaler Inhale 1-2 puffs into the lungs every 4 (four) hours as needed for wheezing or  shortness of breath. 06/28/20   [provider]  carvedilol (COREG CR) 40 MG 24 hr capsule Take 40 mg by mouth daily.    [provider]  cyclobenzaprine (FLEXERIL) 5 MG tablet Take 5 mg by mouth at bedtime. For restless leg and back pain 08/19/20   [provider]  escitalopram (LEXAPRO) 20 MG tablet Take 20 mg by mouth daily. 06/21/20   [provider]  Magnesium 400 MG TABS Take 400 mg by mouth daily. 11/11/21   Barb Merino, MD  melatonin 5 MG TABS Take 5 mg by mouth at bedtime.    [provider]  Multiple Vitamins-Minerals (MULTIVITAMIN WITH MINERALS) tablet Take 1 tablet by mouth daily.    [provider]  pantoprazole (PROTONIX) 40 MG tablet Take 1 tablet (40 mg total) by mouth 2 (two) times daily. 02/18/20   Mercy Riding, MD  potassium chloride (KLOR-CON M) 10 MEQ tablet Take 1 tablet (10 mEq total) by mouth 3 (three) times daily for 14 days. 11/11/21 11/25/21  Barb Merino, MD  ropinirole (REQUIP) 5 MG tablet Take 5 mg by mouth at bedtime. 08/19/20   [provider]  torsemide (DEMADEX) 5 MG tablet Take 5 mg by mouth daily. 09/18/21   [provider]                                                                                                                                    Past Surgical History Past Surgical History:  Procedure Laterality Date   BALLOON DILATION N/A 01/25/2020   Procedure: BALLOON DILATION;  Surgeon: Carol Ada, MD;  Location: [facility];  Service: Endoscopy;  Laterality: N/A;   BALLOON DILATION N/A 10/10/2021   Procedure: BALLOON DILATION;  Surgeon: Carol Ada, MD;  Location: [facility];  Service: Gastroenterology;  Laterality: N/A;   BALLOON DILATION N/A 11/21/2021   Procedure: BALLOON DILATION;  Surgeon: Carol Ada, MD;  Location: [facility];  Service: Gastroenterology;  Laterality: N/A;   BALLOON DILATION N/A 01/02/2022   Procedure: BALLOON DILATION;  Surgeon: Carol Ada, MD;   Location: [facility];  Service: Gastroenterology;  Laterality: N/A;   BALLOON DILATION N/A 02/13/2022   Procedure: BALLOON DILATION;  Surgeon: Carol Ada, MD;  Location: [facility];  Service: Gastroenterology;  Laterality: N/A;   CHOLECYSTECTOMY     COLONOSCOPY WITH PROPOFOL N/A 05/20/2017   Procedure: COLONOSCOPY WITH PROPOFOL;  Surgeon: Carol Ada, MD;  Location: [facility];  Service: Endoscopy;  Laterality: N/A;  ESOPHAGOGASTRODUODENOSCOPY N/A 05/19/2017   Procedure: ESOPHAGOGASTRODUODENOSCOPY (EGD);  Surgeon: Juanita Craver, MD;  Location[facility]COPY;  Service: Endoscopy;  Laterality: N/A;   ESOPHAGOGASTRODUODENOSCOPY (EGD) WITH PROPOFOL N/A 01/17/2019   Procedure: ESOPHAGOGASTRODUODENOSCOPY (EGD) WITH PROPOFOL;  Surgeon: Carol Ada, MD;  Location: [facility];  Service: Endoscopy;  Laterality: N/A;   ESOPHAGOGASTRODUODENOSCOPY (EGD) WITH PROPOFOL N/A 01/25/2020   Procedure: ESOPHAGOGASTRODUODENOSCOPY (EGD) WITH PROPOFOL;  Surgeon: Carol Ada, MD;  Location: [facility];  Service: Endoscopy;  Laterality: N/A;   ESOPHAGOGASTRODUODENOSCOPY (EGD) WITH PROPOFOL N/A 07/09/2020   Procedure: ESOPHAGOGASTRODUODENOSCOPY (EGD) WITH PROPOFOL;  Surgeon: Carol Ada, MD;  Location: [facility];  Service: Endoscopy;  Laterality: N/A;   ESOPHAGOGASTRODUODENOSCOPY (EGD) WITH PROPOFOL N/A 10/10/2021   Procedure: ESOPHAGOGASTRODUODENOSCOPY (EGD) WITH PROPOFOL;  Surgeon: Carol Ada, MD;  Location: [facility];  Service: Gastroenterology;  Laterality: N/A;   ESOPHAGOGASTRODUODENOSCOPY (EGD) WITH PROPOFOL N/A 11/21/2021   Procedure: ESOPHAGOGASTRODUODENOSCOPY (EGD) WITH PROPOFOL;  Surgeon: Carol Ada, MD;  [facility]: [facility];  Service: Gastroenterology;  Laterality: N/A;   ESOPHAGOGASTRODUODENOSCOPY (EGD) WITH PROPOFOL N/A 01/02/2022   Procedure: ESOPHAGOGASTRODUODENOSCOPY (EGD) WITH PROPOFOL;  Surgeon: Carol Ada, MD;  [facility]: [facility];  Service: Gastroenterology;  Laterality: N/A;    ESOPHAGOGASTRODUODENOSCOPY (EGD) WITH PROPOFOL N/A 02/13/2022   Procedure: ESOPHAGOGASTRODUODENOSCOPY (EGD) WITH PROPOFOL;  Surgeon: Carol Ada, MD;  Location: [facility];  Service: Gastroenterology;  Laterality: N/A;   SAVORY DILATION N/A 01/17/2019   Procedure: SAVORY DILATION;  Surgeon: Carol Ada, MD;  Location: [facility];  Service: Endoscopy;  Laterality: N/A;   Family History Family History  Problem Relation Age of Onset   Heart attack Sister    Heart failure Sister    Heart attack Brother    Heart failure Brother     Social History Social History   Tobacco Use   Smoking status: Never   Smokeless tobacco: Never  Vaping Use   Vaping Use: Never used  Substance Use Topics   Alcohol use: No   Drug use: No   Allergies Amlodipine besylate  Review of Systems Review of Systems  All other systems reviewed and are negative.   Physical Exam Vital Signs  I have reviewed the triage vital signs BP 133/85   Pulse 91   Temp 98.5 F (36.9 C) (Oral)   Resp 17   SpO2 100%  Physical Exam Vitals and nursing note reviewed.  Constitutional:      General: She is not in acute distress.    Appearance: She is well-developed.  HENT:     Head: Normocephalic and atraumatic.     Mouth/Throat:     Mouth: Mucous membranes are moist.  Eyes:     Pupils: Pupils are equal, round, and reactive to light.  Cardiovascular:     Rate and Rhythm: Normal rate and regular rhythm.     Heart sounds: No murmur heard. Pulmonary:     Effort: Pulmonary effort is normal. No respiratory distress.     Breath sounds: Normal breath sounds.  Abdominal:     General: Abdomen is flat.     Palpations: Abdomen is soft.     Tenderness: There is no abdominal tenderness.  Musculoskeletal:        General: No tenderness.     Right lower leg: No edema.     Left lower leg: No edema.  Skin:    General: Skin is warm and dry.     Coloration: Skin is pale.  Neurological:     General: No focal deficit  present.  Mental Status: She is alert. Mental status is at baseline.  Psychiatric:        Mood and Affect: Mood normal.        Behavior: Behavior normal.     ED Results and Treatments Labs (all labs ordered are listed, but only abnormal results are displayed) Labs Reviewed  COMPREHENSIVE METABOLIC PANEL - Abnormal; Notable for the following components:      Result Value   Calcium 7.8 (*)    Total Protein 5.7 (*)    Albumin 2.2 (*)    All other components within normal limits  CBC WITH DIFFERENTIAL/PLATELET - Abnormal; Notable for the following components:   RBC 3.10 (*)    Hemoglobin 9.9 (*)    HCT 30.5 (*)    All other components within normal limits  URINALYSIS, ROUTINE W REFLEX MICROSCOPIC - Abnormal; Notable for the following components:   Color, Urine STRAW (*)    All other components within normal limits  BRAIN NATRIURETIC PEPTIDE - Abnormal; Notable for the following components:   B Natriuretic Peptide 352.5 (*)    All other components within normal limits  PROTIME-INR  TROPONIN I (HIGH SENSITIVITY)  TROPONIN I (HIGH SENSITIVITY)                                                                                                                          Radiology DG Chest Port 1 View  Result Date: 06/30/2022 CLINICAL DATA:  weakness EXAM: PORTABLE CHEST - 1 VIEW COMPARISON:  06/14/2022 FINDINGS: Cardiac silhouette is unremarkable. No pneumothorax or pleural effusion. Right hemidiaphragm is elevated. Aorta is calcified. The lungs are clear. The visualized skeletal structures are unremarkable. IMPRESSION: No acute cardiopulmonary process. Electronically Signed   By: Sammie Bench M.D.   On: 06/30/2022 10:49    Pertinent labs & imaging results that were available during my care of the patient were reviewed by me and considered in my medical decision making (see MDM for details).  Medications Ordered in ED Medications  sodium chloride 0.9 % bolus 1,000 mL (0 mLs  Intravenous Stopped 06/30/22 1216)                                                                                                                                     Procedures Procedures  (including critical care time)  Medical Decision Making / ED Course   MDM:  77 year old female  presenting to the emergency department with weakness and syncope.  Patient well-appearing, vital signs reassuring in the emergency department, no fever, no hypotension.  Reportedly was hypotensive initially with EMS to 84/60.  Differential includes orthostatic or vasovagal syncope, blood sugar was reportedly normal with EMS at 74.  No headaches to suggest neurologic cause of syncope.  No chest pain or abnormal EKG findings to suggest cardiogenic cause of syncope however patient does have CHF history.  Will check labs including troponin, chest x-ray, and reassess.  Patient does appear pale but has chronic anemia and denies any signs of GI bleeding such as melena or hematochezia.  Patient was admitted for a similar episode in June 2023 without identification of underlying cardiac cause.  Clinical Course as of 06/30/22 1355  Tue Jun 30, 2022  1343 Patient feels better after fluids.  Vital stable.  She reports she did not eat or drink anything today at all.  Possible weakness is related to this, this was ultimately thought to be the cause of her weakness when she was admitted to the hospital over June for the same process.  Spouse at bedside reports that this is the same as previous episodes.  Offered admission for monitoring, patient preferred to go home.  Since she has been admitted previously for similar episodes without evidence of dangerous underlying diagnosis I think is reasonable to go home and follow-up closely as an outpatient with very strict return precautions. Will discharge patient to home. All questions answered. Patient comfortable with plan of discharge. Return precautions discussed with patient and  specified on the after visit summary.  [WS]    Clinical Course User Index [WS] Cristie Hem, MD     Additional history obtained: -Additional history obtained from family -External records from outside source obtained and reviewed including: Chart review including previous notes, labs, imaging, consultation notes including admission in June   Lab Tests: -I ordered, reviewed, and interpreted labs.   The pertinent results include:   Labs Reviewed  COMPREHENSIVE METABOLIC PANEL - Abnormal; Notable for the following components:      Result Value   Calcium 7.8 (*)    Total Protein 5.7 (*)    Albumin 2.2 (*)    All other components within normal limits  CBC WITH DIFFERENTIAL/PLATELET - Abnormal; Notable for the following components:   RBC 3.10 (*)    Hemoglobin 9.9 (*)    HCT 30.5 (*)    All other components within normal limits  URINALYSIS, ROUTINE W REFLEX MICROSCOPIC - Abnormal; Notable for the following components:   Color, Urine STRAW (*)    All other components within normal limits  BRAIN NATRIURETIC PEPTIDE - Abnormal; Notable for the following components:   B Natriuretic Peptide 352.5 (*)    All other components within normal limits  PROTIME-INR  TROPONIN I (HIGH SENSITIVITY)  TROPONIN I (HIGH SENSITIVITY)    Notable for chronic anemia, elevated BNP without findings of decompensated heart failure  EKG   EKG Interpretation  Date/Time:  Tuesday June 30 2022 10:04:13 EST Ventricular Rate:  90 PR Interval:  129 QRS Duration: 93 QT Interval:  388 QTC Calculation: 475 R Axis:   22 Text Interpretation: Sinus rhythm Anterior infarct, old Confirmed by Garnette Gunner [phone]) on 06/30/2022 10:39:50 AM         Imaging Studies ordered: I ordered imaging studies including CXR On my interpretation imaging demonstrates no acute process I independently visualized and interpreted imaging. I agree with the radiologist interpretation  Medicines ordered and  prescription drug management: Meds ordered this encounter  Medications   sodium chloride 0.9 % bolus 1,000 mL    -I have reviewed the patients home medicines and have made adjustments as needed  Cardiac Monitoring: The patient was maintained on a cardiac monitor.  I personally viewed and interpreted the cardiac monitored which showed an underlying rhythm of: NSR  Social Determinants of Health:  Diagnosis or treatment significantly limited by social determinants of health: unable to afford MD visits   Reevaluation: After the interventions noted above, I reevaluated the patient and found that they have improved  Co morbidities that complicate the patient evaluation  Past Medical History:  Diagnosis Date   Aortic atherosclerosis ([place])    Arthritis    Back pain 02/23/2020   Chronic combined systolic and diastolic CHF (congestive heart failure) ([place])    Coronary atherosclerosis    Depression    Diabetes mellitus    Esophageal stricture    Fall    Hiatal hernia    Hypercholesteremia    Hypertension    Obesity    Pneumonia    Reflux    Renal disorder    shutting down 4 years ago   Subdural hematoma ([facility])    Thyroid nodule    Urinary retention    UTI due to Klebsiella species       Dispostion: Disposition decision including need for hospitalization was considered, and patient discharged after shared decision making regarding admission for observation    Final Clinical Impression(s) / ED Diagnoses Final diagnoses:  Syncope, unspecified syncope type     This chart was dictated using voice recognition software.  Despite best efforts to proofread,  errors can occur which can change the documentation meaning.    Cristie Hem, MD 06/30/22 1356

## 2022-06-30 NOTE — Discharge Instructions (Addendum)
We evaluated you in the emergency department for your fainting episode.  Your lab tests in the emergency department were reassuring.  We discussed staying in the hospital and you preferred to go home.  We are not fully sure what caused your fainting episode but it is probably caused by not eating or drinking enough.  Please be sure to eat and drink to keep up your fluid intake.  If you have any further episodes you should return to the emergency department as we may need to keep you in the hospital for further testing.  Please follow-up closely with your primary care doctor.  Please call for an appointment as soon as possible.  Please return to the emergency department if you develop any recurrent episodes, chest pain, difficulty breathing, fevers or chills, lightheadedness or dizziness, or any other concerning symptoms.

## 2022-06-30 NOTE — ED Notes (Signed)
CXR IN ROOM

## 2022-06-30 NOTE — ED Triage Notes (Addendum)
Patient BIB [facility] from [place]. Patient felt bad and weak while checking out, sat down in a chair and went unresponsive. When EMS arrived, patient A&O X4. Has diabetes and did not eat breakfast.   EMS  18G left AC 84/60, gave 531m fluids, improved to 142/70 HR 88 CBG 74

## 2022-06-30 NOTE — ED Notes (Signed)
Bloodwork collected and sent to lab. Husband at bedside. Purewick placed on patient after cleaning area, educated on need for urine sample. Pt verb understanding.

## 2022-07-10 ENCOUNTER — Other Ambulatory Visit: Payer: Self-pay

## 2022-07-10 ENCOUNTER — Encounter (HOSPITAL_COMMUNITY): Payer: Self-pay

## 2022-07-10 ENCOUNTER — Emergency Department (HOSPITAL_COMMUNITY)
Admission: EM | Admit: 2022-07-10 | Discharge: 2022-07-10 | Disposition: A | Discharge status: Home / Self Care | Payer: Medicare HMO | Attending: Emergency Medicine | Admitting: Emergency Medicine

## 2022-07-10 ENCOUNTER — Emergency Department (HOSPITAL_COMMUNITY): Payer: Medicare HMO

## 2022-07-10 DIAGNOSIS — R000 Tachycardia, unspecified: Secondary | ICD-10-CM | POA: Insufficient documentation

## 2022-07-10 DIAGNOSIS — R0602 Shortness of breath: Secondary | ICD-10-CM | POA: Diagnosis not present

## 2022-07-10 DIAGNOSIS — R55 Syncope and collapse: Secondary | ICD-10-CM | POA: Diagnosis not present

## 2022-07-10 DIAGNOSIS — J189 Pneumonia, unspecified organism: Secondary | ICD-10-CM | POA: Diagnosis not present

## 2022-07-10 LAB — CBC WITH DIFFERENTIAL/PLATELET
Abs Immature Granulocytes: 0.01 10*3/uL (ref 0.00–0.07)
Basophils Absolute: 0 10*3/uL (ref 0.0–0.1)
Basophils Relative: 1 %
Eosinophils Absolute: 0.2 10*3/uL (ref 0.0–0.5)
Eosinophils Relative: 4 %
HCT: 31.3 % — ABNORMAL LOW (ref 36.0–46.0)
Hemoglobin: 10.4 g/dL — ABNORMAL LOW (ref 12.0–15.0)
Immature Granulocytes: 0 %
Lymphocytes Relative: 39 %
Lymphs Abs: 1.8 10*3/uL (ref 0.7–4.0)
MCH: 31.8 pg (ref 26.0–34.0)
MCHC: 33.2 g/dL (ref 30.0–36.0)
MCV: 95.7 fL (ref 80.0–100.0)
Monocytes Absolute: 0.3 10*3/uL (ref 0.1–1.0)
Monocytes Relative: 6 %
Neutro Abs: 2.3 10*3/uL (ref 1.7–7.7)
Neutrophils Relative %: 50 %
Platelets: 207 10*3/uL (ref 150–400)
RBC: 3.27 MIL/uL — ABNORMAL LOW (ref 3.87–5.11)
RDW: 13.6 % (ref 11.5–15.5)
WBC: 4.6 10*3/uL (ref 4.0–10.5)
nRBC: 0 % (ref 0.0–0.2)

## 2022-07-10 LAB — BASIC METABOLIC PANEL
Anion gap: 8 (ref 5–15)
BUN: 10 mg/dL (ref 8–23)
CO2: 21 mmol/L — ABNORMAL LOW (ref 22–32)
Calcium: 7.9 mg/dL — ABNORMAL LOW (ref 8.9–10.3)
Chloride: 105 mmol/L (ref 98–111)
Creatinine, Ser: 0.83 mg/dL (ref 0.44–1.00)
GFR, Estimated: >60 mL/min (ref >60)
Glucose, Bld: 83 mg/dL (ref 70–99)
Potassium: 3.8 mmol/L (ref 3.5–5.1)
Sodium: 134 mmol/L — ABNORMAL LOW (ref 135–145)

## 2022-07-10 LAB — TROPONIN I (HIGH SENSITIVITY): Troponin I (High Sensitivity): 4 ng/L (ref <18)

## 2022-07-10 LAB — BRAIN NATRIURETIC PEPTIDE: B Natriuretic Peptide: 322.4 pg/mL — ABNORMAL HIGH (ref 0.0–100.0)

## 2022-07-10 MED ORDER — DOXYCYCLINE HYCLATE 100 MG PO TABS
100.0000 mg | ORAL_TABLET | Freq: Once | ORAL | Status: AC
  Administered 2022-07-10: 100 mg via ORAL
  Filled 2022-07-10: qty 1

## 2022-07-10 MED ORDER — IOHEXOL 350 MG/ML SOLN
75.0000 mL | Freq: Once | INTRAVENOUS | Status: AC | PRN
  Administered 2022-07-10: 75 mL via INTRAVENOUS

## 2022-07-10 MED ORDER — LACTATED RINGERS IV SOLN: INTRAVENOUS | Status: DC

## 2022-07-10 MED ORDER — LACTATED RINGERS IV BOLUS
1000.0000 mL | Freq: Once | INTRAVENOUS | Status: AC
  Administered 2022-07-10: 1000 mL via INTRAVENOUS

## 2022-07-10 MED ORDER — DOXYCYCLINE HYCLATE 100 MG PO CAPS: 100.0000 mg | ORAL_CAPSULE | Freq: Two times a day (BID) | ORAL | 0 refills | Status: DC

## 2022-07-10 NOTE — ED Notes (Signed)
Food and refreshments given to pt per Dr. Zenia Resides.

## 2022-07-10 NOTE — ED Notes (Signed)
Dr Allen at bedside  

## 2022-07-10 NOTE — Discharge Instructions (Signed)
1.  CT scan does not show any evidence of a blood clot in the lungs.  There are mild findings of a pneumonia or infection.  You are being started on an antibiotic called doxycycline.  You were given your first evening dose in the emergency department.  Start taking it twice daily tomorrow as prescribed. 2.  Your heart rate may be elevated because you have not taken your Coreg (carvedilol) for several days.  This medication controls heart rate and sometimes if it is not taken for several doses, your heart rate goes higher than normal.  Start taking your Coreg tomorrow morning as usual. 3.  Schedule follow-up with your doctor for recheck within the next 3 to 5 days.  Return to the emergency department if you are getting a fever, worsening lightheadedness, chest pain or other concerning changes.

## 2022-07-10 NOTE — ED Provider Notes (Signed)
[place] EMERGENCY DEPARTMENT AT [facility] Provider Note   CSN: [id number] Arrival date & time: 07/10/22  1037     History {Add pertinent medical, surgical, social history, OB history to HPI:1} Chief Complaint  Patient presents with   Near Syncope    Chloe Harding is a 77 y.o. female.  77 year old female started having near syncopal event just prior to arrival.  Patient been standing for approximate 30 minutes when she became dizzy and lightheaded.  Denies any chest pain or shortness of breath.  No chest or abdominal discomfort.  According to son who is at the bedside patient has had this multiple times.  Recent history of volume loss.  No history of dark stools.  Denies any recent medication changes.  Patient's symptoms improved when she sat down.  Called EMS and was transported here       Home Medications Prior to Admission medications   Medication Sig Start Date End Date Taking? Authorizing Provider  albuterol (VENTOLIN HFA) 108 (90 Base) MCG/ACT inhaler Inhale 1-2 puffs into the lungs every 4 (four) hours as needed for wheezing or shortness of breath. 06/28/20   [provider]  carvedilol (COREG CR) 40 MG 24 hr capsule Take 40 mg by mouth daily.    [provider]  cyclobenzaprine (FLEXERIL) 5 MG tablet Take 5 mg by mouth at bedtime. For restless leg and back pain 08/19/20   [provider]  escitalopram (LEXAPRO) 20 MG tablet Take 20 mg by mouth daily. 06/21/20   [provider]  Magnesium 400 MG TABS Take 400 mg by mouth daily. 11/11/21   Barb Merino, MD  melatonin 5 MG TABS Take 5 mg by mouth at bedtime.    [provider]  Multiple Vitamins-Minerals (MULTIVITAMIN WITH MINERALS) tablet Take 1 tablet by mouth daily.    [provider]  pantoprazole (PROTONIX) 40 MG tablet Take 1 tablet (40 mg total) by mouth 2 (two) times daily. 02/18/20   Mercy Riding, MD  potassium chloride (KLOR-CON M) 10 MEQ tablet Take 1  tablet (10 mEq total) by mouth 3 (three) times daily for 14 days. 11/11/21 11/25/21  Barb Merino, MD  ropinirole (REQUIP) 5 MG tablet Take 5 mg by mouth at bedtime. 08/19/20   [provider]  torsemide (DEMADEX) 5 MG tablet Take 5 mg by mouth daily. 09/18/21   [provider]      Allergies    Amlodipine besylate    Review of Systems   Review of Systems  All other systems reviewed and are negative.   Physical Exam Updated Vital Signs BP (!) 177/95   Pulse (!) 101   Temp 98 F (36.7 C) (Oral)   Resp 19   Ht 1.524 m (5')   Wt 58.1 kg   SpO2 100%   BMI 25.00 kg/m  Physical Exam Vitals and nursing note reviewed.  Constitutional:      General: She is not in acute distress.    Appearance: Normal appearance. She is well-developed. She is not toxic-appearing.  HENT:     Head: Normocephalic and atraumatic.  Eyes:     General: Lids are normal.     Conjunctiva/sclera: Conjunctivae normal.     Pupils: Pupils are equal, round, and reactive to light.  Neck:     Thyroid: No thyroid mass.     Trachea: No tracheal deviation.  Cardiovascular:     Rate and Rhythm: Normal rate and regular rhythm.  Heart sounds: Normal heart sounds. No murmur heard.    No gallop.  Pulmonary:     Effort: Pulmonary effort is normal. No respiratory distress.     Breath sounds: Normal breath sounds. No stridor. No decreased breath sounds, wheezing, rhonchi or rales.  Abdominal:     General: There is no distension.     Palpations: Abdomen is soft.     Tenderness: There is no abdominal tenderness. There is no rebound.  Musculoskeletal:        General: No tenderness. Normal range of motion.     Cervical back: Normal range of motion and neck supple.  Skin:    General: Skin is warm and dry.     Findings: No abrasion or rash.  Neurological:     Mental Status: She is alert and oriented to person, place, and time. Mental status is at baseline.     GCS: GCS eye subscore is 4. GCS verbal  subscore is 5. GCS motor subscore is 6.     Cranial Nerves: No cranial nerve deficit.     Sensory: No sensory deficit.     Motor: Motor function is intact.  Psychiatric:        Attention and Perception: Attention normal.        Speech: Speech normal.        Behavior: Behavior normal.     ED Results / Procedures / Treatments   Labs (all labs ordered are listed, but only abnormal results are displayed) Labs Reviewed  BASIC METABOLIC PANEL  BRAIN NATRIURETIC PEPTIDE  CBC WITH DIFFERENTIAL/PLATELET  TROPONIN I (HIGH SENSITIVITY)    EKG None  Radiology No results found.  Procedures Procedures  {Document cardiac monitor, telemetry assessment procedure when appropriate:1}  Medications Ordered in ED Medications - No data to display  ED Course/ Medical Decision Making/ A&P   {   Click here for ABCD2, HEART and other calculatorsREFRESH Note before signing :1}                          Medical Decision Making Amount and/or Complexity of Data Reviewed Labs: ordered. Radiology: ordered.   ***  {Document critical care time when appropriate:1} {Document review of labs and clinical decision tools ie heart score, Chads2Vasc2 etc:1}  {Document your independent review of radiology images, and any outside records:1} {Document your discussion with family members, caretakers, and with consultants:1} {Document social determinants of health affecting pt's care:1} {Document your decision making why or why not admission, treatments were needed:1} Final Clinical Impression(s) / ED Diagnoses Final diagnoses:  None    Rx / DC Orders ED Discharge Orders     None

## 2022-07-10 NOTE — ED Provider Notes (Signed)
Persistently tachycardic after fluid resuscitation.  CT PE study pending.  Assess for possible admission. Physical Exam  BP (!) 180/94   Pulse (!) 104   Temp 97.8 F (36.6 C) (Axillary)   Resp 18   Ht 5' (1.524 m)   Wt 58.1 kg   SpO2 100%   BMI 25.00 kg/m   Physical Exam  Procedures  Procedures  ED Course / MDM    Medical Decision Making Amount and/or Complexity of Data Reviewed Labs: ordered. Radiology: ordered. ECG/medicine tests: ordered.  Risk Prescription drug management.   Patient reports he had gotten lightheaded while standing up.  She had also helped unload some groceries this morning.  She denies she is having chest pain.  She denies she is been feeling short of breath.  She does report she has had problems with lightheadedness and feeling close to passing out for a number of years.  She reports it was different today because it seemed to last longer.  Discussed whether or not the patient has been taking her Coreg.  Her son and the patient agree she probably has not taken the Coreg for several days.  This may be a cause for the patient sinus tachycardia.  She is not septic in appearance.  He does not have leukocytosis.  Findings on CT scan are fairly mild.  She does not have hypoxia to suggest any significant pneumonia.  Patient is alert.  No respiratory distress.  Lungs are clear.  Heart is regular.  She continues to have some borderline tachycardia from 90s to 104.  Sinus tachycardia.  Abdomen is soft without guarding.  Abdominal wall and chest wall no rashes or lesions.  Bilateral lower extremities examined.  No significant peripheral edema.  There is mild erythema to the dorsum of the left foot with a little bit of a streak up the leg.  Patient reports this is more or less chronic for her.  She reports sometimes she gets itching and scratches her legs.  She denies she is having any pain.   I have reviewed the patient's lab results and CT study.  I have visually  reviewed the CT scan myself.  CT scan by radiology interpretation shows some mild areas suggestive of possible infectious versus inflammatory changes.  These are quite subtle on the CT scan.  Negative for PE or other acute finding.  Her abdomen is unremarkable.  Patient has gallbladder clips in place.  She has had distant cholecystectomy.  Patient son is at bedside.  He reports he lives with the patient and is a caregiver for her as well.  She has good support at home.  Plan at this time will be to start the patient on doxycycline for CT findings.  She will resume her Coreg tomorrow morning.  Tachycardia may well be due to to rebound from not having her beta-blocker for several days.  Reviewed careful return precautions and immediately sitting down or elevating legs if she feels lightheaded.  Patient and her son voiced understanding and are agreeable for managing this at home with careful return precautions reviewed       Charlesetta Shanks, MD 07/10/22 [phone]

## 2022-07-10 NOTE — ED Triage Notes (Signed)
Pt BIB [facility] from home. Pt states she was cooking when she started feeling light headed and "like about to pass out"

## 2022-08-02 ENCOUNTER — Emergency Department (HOSPITAL_COMMUNITY): Payer: Medicare HMO

## 2022-08-02 ENCOUNTER — Other Ambulatory Visit: Payer: Self-pay

## 2022-08-02 ENCOUNTER — Emergency Department (HOSPITAL_COMMUNITY)
Admission: EM | Admit: 2022-08-02 | Discharge: 2022-08-02 | Disposition: A | Discharge status: Home / Self Care | Payer: Medicare HMO | Attending: Emergency Medicine | Admitting: Emergency Medicine

## 2022-08-02 DIAGNOSIS — R55 Syncope and collapse: Secondary | ICD-10-CM | POA: Diagnosis present

## 2022-08-02 LAB — CBC WITH DIFFERENTIAL/PLATELET
Abs Immature Granulocytes: 0.02 10*3/uL (ref 0.00–0.07)
Basophils Absolute: 0 10*3/uL (ref 0.0–0.1)
Basophils Relative: 1 %
Eosinophils Absolute: 0.3 10*3/uL (ref 0.0–0.5)
Eosinophils Relative: 7 %
HCT: 34.8 % — ABNORMAL LOW (ref 36.0–46.0)
Hemoglobin: 11.6 g/dL — ABNORMAL LOW (ref 12.0–15.0)
Immature Granulocytes: 0 %
Lymphocytes Relative: 34 %
Lymphs Abs: 1.7 10*3/uL (ref 0.7–4.0)
MCH: 30.9 pg (ref 26.0–34.0)
MCHC: 33.3 g/dL (ref 30.0–36.0)
MCV: 92.6 fL (ref 80.0–100.0)
Monocytes Absolute: 0.3 10*3/uL (ref 0.1–1.0)
Monocytes Relative: 5 %
Neutro Abs: 2.7 10*3/uL (ref 1.7–7.7)
Neutrophils Relative %: 53 %
Platelets: 262 10*3/uL (ref 150–400)
RBC: 3.76 MIL/uL — ABNORMAL LOW (ref 3.87–5.11)
RDW: 13.4 % (ref 11.5–15.5)
WBC: 5.1 10*3/uL (ref 4.0–10.5)
nRBC: 0 % (ref 0.0–0.2)

## 2022-08-02 LAB — COMPREHENSIVE METABOLIC PANEL
ALT: 13 U/L (ref 0–44)
AST: 27 U/L (ref 15–41)
Albumin: 2.5 g/dL — ABNORMAL LOW (ref 3.5–5.0)
Alkaline Phosphatase: 137 U/L — ABNORMAL HIGH (ref 38–126)
Anion gap: 6 (ref 5–15)
BUN: 9 mg/dL (ref 8–23)
CO2: 23 mmol/L (ref 22–32)
Calcium: 8.8 mg/dL — ABNORMAL LOW (ref 8.9–10.3)
Chloride: 105 mmol/L (ref 98–111)
Creatinine, Ser: 0.84 mg/dL (ref 0.44–1.00)
GFR, Estimated: >60 mL/min (ref >60)
Glucose, Bld: 104 mg/dL — ABNORMAL HIGH (ref 70–99)
Potassium: 4 mmol/L (ref 3.5–5.1)
Sodium: 134 mmol/L — ABNORMAL LOW (ref 135–145)
Total Bilirubin: 0.9 mg/dL (ref 0.3–1.2)
Total Protein: 6.8 g/dL (ref 6.5–8.1)

## 2022-08-02 LAB — TROPONIN I (HIGH SENSITIVITY): Troponin I (High Sensitivity): 5 ng/L (ref <18)

## 2022-08-02 LAB — CBG MONITORING, ED: Glucose-Capillary: 108 mg/dL — ABNORMAL HIGH (ref 70–99)

## 2022-08-02 MED ORDER — SODIUM CHLORIDE 0.9 % IV BOLUS
500.0000 mL | Freq: Once | INTRAVENOUS | Status: AC
  Administered 2022-08-02: 500 mL via INTRAVENOUS

## 2022-08-02 MED ORDER — SODIUM CHLORIDE 0.9 % IV SOLN: INTRAVENOUS | Status: DC

## 2022-08-02 NOTE — Discharge Instructions (Signed)
Continue your medications.  Follow up with the cardiologist as planned.  Make sure to stand up slowly when getting up.  Return as needed for recurrent symptoms

## 2022-08-02 NOTE — ED Triage Notes (Signed)
EMS reports from home, c/o dizziness since this morning.  BP 144/68 HR 120 RR 16 Sp02 99 RA CBG 136 Temp 97.1

## 2022-08-02 NOTE — ED Provider Notes (Signed)
[place] EMERGENCY DEPARTMENT AT [facility] Provider Note   CSN: [id number] Arrival date & time: 08/02/22  1001     History  Chief Complaint  Patient presents with   Dizziness    Chloe Harding is a 77 y.o. female.   Dizziness  Pt states when she got up this am she felt like she was going to pass out.  She noticed it when she tried to stand up.  She felt better when she sat back down.  No blood in stool.  No dark stools.  NO chest pain. No abd pain.  No shortness of breath.  No palpitations.  No trouble with speech.  No trouble with weakness numbness in extremities.  Pt states she has been seen by her PCP for this as she has had episodes  every couple of weeks the last few months.  She is scheduled to see a cardiologist.    Home Medications Prior to Admission medications   Medication Sig Start Date End Date Taking? Authorizing Provider  albuterol (VENTOLIN HFA) 108 (90 Base) MCG/ACT inhaler Inhale 1-2 puffs into the lungs every 4 (four) hours as needed for wheezing or shortness of breath. 06/28/20   [provider]  carvedilol (COREG CR) 40 MG 24 hr capsule Take 40 mg by mouth daily.    [provider]  cyclobenzaprine (FLEXERIL) 5 MG tablet Take 5 mg by mouth at bedtime. For restless leg and back pain 08/19/20   [provider]  doxycycline (VIBRAMYCIN) 100 MG capsule Take 1 capsule (100 mg total) by mouth 2 (two) times daily. One po bid x 7 days 07/10/22   Charlesetta Shanks, MD  escitalopram (LEXAPRO) 20 MG tablet Take 20 mg by mouth daily. 06/21/20   [provider]  Magnesium 400 MG TABS Take 400 mg by mouth daily. 11/11/21   Barb Merino, MD  melatonin 5 MG TABS Take 5 mg by mouth at bedtime.    [provider]  Multiple Vitamins-Minerals (MULTIVITAMIN WITH MINERALS) tablet Take 1 tablet by mouth daily.    [provider]  pantoprazole (PROTONIX) 40 MG tablet Take 1 tablet (40 mg total) by mouth 2 (two) times daily.  02/18/20   Mercy Riding, MD  potassium chloride (KLOR-CON M) 10 MEQ tablet Take 1 tablet (10 mEq total) by mouth 3 (three) times daily for 14 days. 11/11/21 11/25/21  Barb Merino, MD  ropinirole (REQUIP) 5 MG tablet Take 5 mg by mouth at bedtime. 08/19/20   [provider]  torsemide (DEMADEX) 5 MG tablet Take 5 mg by mouth daily. 09/18/21   [provider]      Allergies    Amlodipine besylate    Review of Systems   Review of Systems  Neurological:  Positive for dizziness.    Physical Exam Updated Vital Signs BP (!) 117/93   Pulse (!) 103   Temp (!) 97.5 F (36.4 C) (Oral)   Resp 18   SpO2 100%  Physical Exam Vitals and nursing note reviewed.  Constitutional:      Appearance: She is well-developed. She is not diaphoretic.     Comments: Elderly frail   HENT:     Head: Normocephalic and atraumatic.     Right Ear: External ear normal.     Left Ear: External ear normal.  Eyes:     General: No visual field deficit or scleral icterus.       Right eye: No discharge.  Left eye: No discharge.     Conjunctiva/sclera: Conjunctivae normal.  Neck:     Trachea: No tracheal deviation.  Cardiovascular:     Rate and Rhythm: Normal rate and regular rhythm.  Pulmonary:     Effort: Pulmonary effort is normal. No respiratory distress.     Breath sounds: Normal breath sounds. No stridor. No wheezing or rales.  Abdominal:     General: Bowel sounds are normal. There is no distension.     Palpations: Abdomen is soft.     Tenderness: There is no abdominal tenderness. There is no guarding or rebound.  Musculoskeletal:        General: No tenderness or deformity.     Cervical back: Neck supple.  Skin:    General: Skin is warm and dry.     Coloration: Skin is pale.     Findings: No rash.  Neurological:     General: No focal deficit present.     Mental Status: She is alert and oriented to person, place, and time.     GCS: GCS eye subscore is 4. GCS verbal subscore is  5. GCS motor subscore is 6.     Cranial Nerves: No cranial nerve deficit, dysarthria or facial asymmetry.     Sensory: Sensation is intact. No sensory deficit.     Motor: No weakness, abnormal muscle tone or seizure activity.     Coordination: Coordination normal. Finger-Nose-Finger Test normal.  Psychiatric:        Mood and Affect: Mood normal.     ED Results / Procedures / Treatments   Labs (all labs ordered are listed, but only abnormal results are displayed) Labs Reviewed  CBC WITH DIFFERENTIAL/PLATELET - Abnormal; Notable for the following components:      Result Value   RBC 3.76 (*)    Hemoglobin 11.6 (*)    HCT 34.8 (*)    All other components within normal limits  COMPREHENSIVE METABOLIC PANEL - Abnormal; Notable for the following components:   Sodium 134 (*)    Glucose, Bld 104 (*)    Calcium 8.8 (*)    Albumin 2.5 (*)    Alkaline Phosphatase 137 (*)    All other components within normal limits  CBG MONITORING, ED - Abnormal; Notable for the following components:   Glucose-Capillary 108 (*)    All other components within normal limits  TROPONIN I (HIGH SENSITIVITY)    EKG EKG Interpretation  Date/Time:  Sunday August 02 2022 10:19:59 EDT Ventricular Rate:  109 PR Interval:  139 QRS Duration: 88 QT Interval:  324 QTC Calculation: 437 R Axis:   45 Text Interpretation: Sinus tachycardia Atrial premature complex Low voltage, precordial leads No significant change since last tracing Confirmed by Dorie Rank [phone]) on 08/02/2022 10:34:18 AM  Radiology DG Chest 2 View  Result Date: 08/02/2022 CLINICAL DATA:  Weakness EXAM: CHEST - 2 VIEW COMPARISON:  07/10/2022 FINDINGS: Chronic elevation RIGHT hemidiaphragm. No effusion, infiltrate, or pneumothorax. Normal cardiac silhouette. No acute osseous abnormality. IMPRESSION: No acute cardiopulmonary process. Electronically Signed   By: Suzy Bouchard M.D.   On: 08/02/2022 11:21    Procedures Procedures    Medications  Ordered in ED Medications  sodium chloride 0.9 % bolus 500 mL (500 mLs Intravenous New Bag/Given 08/02/22 1149)    And  0.9 %  sodium chloride infusion ( Intravenous New Bag/Given 08/02/22 1149)    ED Course/ Medical Decision Making/ A&P Clinical Course as of 08/02/22 1458  Sun Aug 02, 2022  1245 CBC WITH DIFFERENTIAL(!) Hemoglobin stable [JK]  1425 Comprehensive metabolic panel(!) No significant electrolyte abnormalities.  Troponin normal [JK]  1425 X-ray without acute findings [JK]  1452 Patient was able to ambulate in the emergency department.  No recurrent syncope or difficulty walking [JK]    Clinical Course User Index [JK] Dorie Rank, MD                             Medical Decision Making Problems Addressed: Near syncope: acute illness or injury that poses a threat to life or bodily functions  Amount and/or Complexity of Data Reviewed Labs: ordered. Decision-making details documented in ED Course. Radiology: ordered and independent interpretation performed. ECG/medicine tests: ordered.  Risk Prescription drug management.   Patient presented to the ED for evaluation of lightheadedness.  This occurred when she was trying to stand up this morning.  Patient has been having issues with recurrent weakness and syncope.  She was seen in the emergency room back in January and twice in February.  ED workup is reassuring.  No signs of anemia.  No signs of severe dehydration.  Patient is not having any trouble with chest pain or shortness of breath.  She does have close follow-up planned with cardiology this week.  She is able to ambulate and is not having any recurrent syncope.  Presentation is suggestive of possible orthostatic hypotension.  Evaluation and diagnostic testing in the emergency department does not suggest an emergent condition requiring admission or immediate intervention beyond what has been performed at this time.  The patient is safe for discharge and has been  instructed to return immediately for worsening symptoms, change in symptoms or any other concerns.        Final Clinical Impression(s) / ED Diagnoses Final diagnoses:  Near syncope    Rx / DC Orders ED Discharge Orders     None         Dorie Rank, MD 08/02/22 1500

## 2023-05-21 ENCOUNTER — Inpatient Hospital Stay (HOSPITAL_COMMUNITY)
Admission: EM | Admit: 2023-05-21 | Discharge: 2023-06-19 | DRG: 368 | Disposition: E | Payer: Medicare Other | Attending: Family Medicine | Admitting: Family Medicine

## 2023-05-21 ENCOUNTER — Emergency Department (HOSPITAL_COMMUNITY): Payer: Medicare Other

## 2023-05-21 ENCOUNTER — Other Ambulatory Visit: Payer: Self-pay

## 2023-05-21 ENCOUNTER — Encounter (HOSPITAL_COMMUNITY): Payer: Self-pay

## 2023-05-21 DIAGNOSIS — I2510 Atherosclerotic heart disease of native coronary artery without angina pectoris: Secondary | ICD-10-CM | POA: Diagnosis present

## 2023-05-21 DIAGNOSIS — E7800 Pure hypercholesterolemia, unspecified: Secondary | ICD-10-CM | POA: Diagnosis present

## 2023-05-21 DIAGNOSIS — I10 Essential (primary) hypertension: Secondary | ICD-10-CM | POA: Diagnosis not present

## 2023-05-21 DIAGNOSIS — N179 Acute kidney failure, unspecified: Secondary | ICD-10-CM | POA: Diagnosis present

## 2023-05-21 DIAGNOSIS — I468 Cardiac arrest due to other underlying condition: Secondary | ICD-10-CM | POA: Diagnosis not present

## 2023-05-21 DIAGNOSIS — G2581 Restless legs syndrome: Secondary | ICD-10-CM | POA: Diagnosis present

## 2023-05-21 DIAGNOSIS — E119 Type 2 diabetes mellitus without complications: Secondary | ICD-10-CM | POA: Diagnosis present

## 2023-05-21 DIAGNOSIS — R531 Weakness: Secondary | ICD-10-CM | POA: Diagnosis present

## 2023-05-21 DIAGNOSIS — E861 Hypovolemia: Secondary | ICD-10-CM | POA: Diagnosis present

## 2023-05-21 DIAGNOSIS — D62 Acute posthemorrhagic anemia: Secondary | ICD-10-CM | POA: Diagnosis present

## 2023-05-21 DIAGNOSIS — J9601 Acute respiratory failure with hypoxia: Secondary | ICD-10-CM | POA: Diagnosis not present

## 2023-05-21 DIAGNOSIS — Z1152 Encounter for screening for COVID-19: Secondary | ICD-10-CM | POA: Diagnosis not present

## 2023-05-21 DIAGNOSIS — I110 Hypertensive heart disease with heart failure: Secondary | ICD-10-CM | POA: Diagnosis present

## 2023-05-21 DIAGNOSIS — Z9181 History of falling: Secondary | ICD-10-CM | POA: Diagnosis not present

## 2023-05-21 DIAGNOSIS — Z8249 Family history of ischemic heart disease and other diseases of the circulatory system: Secondary | ICD-10-CM

## 2023-05-21 DIAGNOSIS — K222 Esophageal obstruction: Secondary | ICD-10-CM | POA: Diagnosis present

## 2023-05-21 DIAGNOSIS — I5032 Chronic diastolic (congestive) heart failure: Secondary | ICD-10-CM | POA: Diagnosis present

## 2023-05-21 DIAGNOSIS — R000 Tachycardia, unspecified: Secondary | ICD-10-CM | POA: Diagnosis present

## 2023-05-21 DIAGNOSIS — K219 Gastro-esophageal reflux disease without esophagitis: Secondary | ICD-10-CM | POA: Diagnosis present

## 2023-05-21 DIAGNOSIS — F32A Depression, unspecified: Secondary | ICD-10-CM | POA: Diagnosis present

## 2023-05-21 DIAGNOSIS — K2091 Esophagitis, unspecified with bleeding: Principal | ICD-10-CM | POA: Diagnosis present

## 2023-05-21 DIAGNOSIS — K209 Esophagitis, unspecified without bleeding: Secondary | ICD-10-CM | POA: Diagnosis not present

## 2023-05-21 DIAGNOSIS — G4089 Other seizures: Secondary | ICD-10-CM | POA: Diagnosis not present

## 2023-05-21 DIAGNOSIS — Z79899 Other long term (current) drug therapy: Secondary | ICD-10-CM | POA: Diagnosis not present

## 2023-05-21 DIAGNOSIS — K922 Gastrointestinal hemorrhage, unspecified: Principal | ICD-10-CM

## 2023-05-21 LAB — RESP PANEL BY RT-PCR (RSV, FLU A&B, COVID)  RVPGX2
Influenza A by PCR: NEGATIVE
Influenza B by PCR: NEGATIVE
Resp Syncytial Virus by PCR: NEGATIVE
SARS Coronavirus 2 by RT PCR: NEGATIVE

## 2023-05-21 LAB — PREPARE RBC (CROSSMATCH)

## 2023-05-21 LAB — BASIC METABOLIC PANEL
Anion gap: 11 (ref 5–15)
BUN: 21 mg/dL (ref 8–23)
CO2: 21 mmol/L — ABNORMAL LOW (ref 22–32)
Calcium: 10.3 mg/dL (ref 8.9–10.3)
Chloride: 108 mmol/L (ref 98–111)
Creatinine, Ser: 1.37 mg/dL — ABNORMAL HIGH (ref 0.44–1.00)
GFR, Estimated: 40 mL/min — ABNORMAL LOW (ref >60)
Glucose, Bld: 101 mg/dL — ABNORMAL HIGH (ref 70–99)
Potassium: 4.2 mmol/L (ref 3.5–5.1)
Sodium: 140 mmol/L (ref 135–145)

## 2023-05-21 LAB — CBC
HCT: 26.6 % — ABNORMAL LOW (ref 36.0–46.0)
Hemoglobin: 7.6 g/dL — ABNORMAL LOW (ref 12.0–15.0)
MCH: 20.6 pg — ABNORMAL LOW (ref 26.0–34.0)
MCHC: 28.6 g/dL — ABNORMAL LOW (ref 30.0–36.0)
MCV: 72.1 fL — ABNORMAL LOW (ref 80.0–100.0)
Platelets: 265 10*3/uL (ref 150–400)
RBC: 3.69 MIL/uL — ABNORMAL LOW (ref 3.87–5.11)
RDW: 19.6 % — ABNORMAL HIGH (ref 11.5–15.5)
WBC: 4.7 10*3/uL (ref 4.0–10.5)
nRBC: 0 % (ref 0.0–0.2)

## 2023-05-21 LAB — HEPATIC FUNCTION PANEL
ALT: 18 U/L (ref 0–44)
AST: 21 U/L (ref 15–41)
Albumin: 3.5 g/dL (ref 3.5–5.0)
Alkaline Phosphatase: 110 U/L (ref 38–126)
Bilirubin, Direct: 0.4 mg/dL — ABNORMAL HIGH (ref 0.0–0.2)
Indirect Bilirubin: 0.8 mg/dL (ref 0.3–0.9)
Total Bilirubin: 1.2 mg/dL (ref 0.0–1.2)
Total Protein: 7.5 g/dL (ref 6.5–8.1)

## 2023-05-21 LAB — I-STAT CG4 LACTIC ACID, ED: Lactic Acid, Venous: 2.3 mmol/L (ref 0.5–1.9)

## 2023-05-21 LAB — CBG MONITORING, ED
Glucose-Capillary: 77 mg/dL (ref 70–99)
Glucose-Capillary: 77 mg/dL (ref 70–99)

## 2023-05-21 MED ORDER — ACETAMINOPHEN 325 MG PO TABS
650.0000 mg | ORAL_TABLET | Freq: Four times a day (QID) | ORAL | Status: DC | PRN
  Administered 2023-05-24 (×2): 650 mg via ORAL
  Filled 2023-05-21 (×2): qty 2

## 2023-05-21 MED ORDER — ACETAMINOPHEN 650 MG RE SUPP: 650.0000 mg | Freq: Four times a day (QID) | RECTAL | Status: DC | PRN

## 2023-05-21 MED ORDER — PROCHLORPERAZINE EDISYLATE 10 MG/2ML IJ SOLN: 10.0000 mg | Freq: Four times a day (QID) | INTRAMUSCULAR | Status: DC | PRN

## 2023-05-21 MED ORDER — SODIUM CHLORIDE 0.9% IV SOLUTION: Freq: Once | INTRAVENOUS | Status: AC

## 2023-05-21 MED ORDER — PANTOPRAZOLE SODIUM 40 MG IV SOLR
40.0000 mg | Freq: Two times a day (BID) | INTRAVENOUS | Status: DC
  Administered 2023-05-22 – 2023-05-24 (×6): 40 mg via INTRAVENOUS
  Filled 2023-05-21 (×6): qty 10

## 2023-05-21 MED ORDER — SODIUM CHLORIDE 0.9% FLUSH
3.0000 mL | Freq: Two times a day (BID) | INTRAVENOUS | Status: DC
  Administered 2023-05-21 – 2023-05-24 (×6): 3 mL via INTRAVENOUS

## 2023-05-21 MED ORDER — LACTATED RINGERS IV BOLUS
1000.0000 mL | Freq: Once | INTRAVENOUS | Status: AC
Start: 2023-05-21 — End: 2023-05-21
  Administered 2023-05-21: 1000 mL via INTRAVENOUS

## 2023-05-21 MED ORDER — SENNOSIDES-DOCUSATE SODIUM 8.6-50 MG PO TABS: 1.0000 | ORAL_TABLET | Freq: Every evening | ORAL | Status: DC | PRN

## 2023-05-21 MED ORDER — PANTOPRAZOLE SODIUM 40 MG IV SOLR
40.0000 mg | Freq: Once | INTRAVENOUS | Status: AC
  Administered 2023-05-21: 40 mg via INTRAVENOUS
  Filled 2023-05-21: qty 10

## 2023-05-21 NOTE — ED Provider Notes (Signed)
 [place] EMERGENCY DEPARTMENT AT [facility] Provider Note   CSN: [id number] Arrival date & time: 05/21/23  1533     History Chief Complaint  Patient presents with   Weakness    HPI Chloe Harding is a 78 y.o. female presenting for altered mental status, confusion. Melena, fatigue, weakness.  Patient's recorded medical, surgical, social, medication list and allergies were reviewed in the Snapshot window as part of the initial history.   Review of Systems   Review of Systems  Constitutional:  Positive for fatigue. Negative for chills and fever.  HENT:  Negative for ear pain and sore throat.   Eyes:  Negative for pain and visual disturbance.  Respiratory:  Negative for cough and shortness of breath.   Cardiovascular:  Negative for chest pain and palpitations.  Gastrointestinal:  Positive for blood in stool. Negative for abdominal pain and vomiting.  Genitourinary:  Negative for dysuria and hematuria.  Musculoskeletal:  Negative for arthralgias and back pain.  Skin:  Negative for color change and rash.  Neurological:  Negative for seizures and syncope.  Psychiatric/Behavioral:  Positive for confusion.   All other systems reviewed and are negative.   Physical Exam Updated Vital Signs BP (!) 143/95   Pulse (!) 126   Temp 98.4 F (36.9 C) (Oral)   Resp (!) 24   Ht 5' (1.524 m)   Wt 63.5 kg   SpO2 98%   BMI 27.34 kg/m  Physical Exam Vitals and nursing note reviewed.  Constitutional:      General: She is not in acute distress.    Appearance: She is well-developed.  HENT:     Head: Normocephalic and atraumatic.  Eyes:     Conjunctiva/sclera: Conjunctivae normal.  Cardiovascular:     Rate and Rhythm: Normal rate and regular rhythm.     Heart sounds: No murmur heard. Pulmonary:     Effort: Pulmonary effort is normal. No respiratory distress.     Breath sounds: Normal breath sounds.  Abdominal:     General: There is no distension.     Palpations:  Abdomen is soft.     Tenderness: There is no abdominal tenderness. There is no right CVA tenderness or left CVA tenderness.  Genitourinary:    Comments: Obvious melena Musculoskeletal:        General: No swelling or tenderness. Normal range of motion.     Cervical back: Neck supple.  Skin:    General: Skin is warm and dry.  Neurological:     General: No focal deficit present.     Mental Status: She is alert and oriented to person, place, and time. Mental status is at baseline.     Cranial Nerves: No cranial nerve deficit.      ED Course/ Medical Decision Making/ A&P    Procedures Procedures   Medications Ordered in ED Medications  pantoprazole  (PROTONIX ) injection 40 mg (has no administration in time range)  sodium chloride  flush (NS) 0.9 % injection 3 mL (3 mLs Intravenous Given 05/21/23 2300)  acetaminophen  (TYLENOL ) tablet 650 mg (has no administration in time range)    Or  acetaminophen  (TYLENOL ) suppository 650 mg (has no administration in time range)  prochlorperazine  (COMPAZINE ) injection 10 mg (has no administration in time range)  senna-docusate (Senokot-S) tablet 1 tablet (has no administration in time range)  lactated ringers  bolus 1,000 mL (0 mLs Intravenous Stopped 05/21/23 2039)  pantoprazole  (PROTONIX ) injection 40 mg (40 mg Intravenous Given 05/21/23 1839)  0.9 %  sodium chloride  infusion (Manually program via Guardrails IV Fluids) ( Intravenous New Bag/Given 05/21/23 2300)    Medical Decision Making:   Patient's history of present illness physical exam findings are most consistent with developing upper GI bleed.  History of esophagitis that causes symptoms.  Consulted Dr. Rollin given downtrending hemoglobin.    Recommended Protonix  and a.m. gastroenterology consultation. Patient no acute distress improving with IV fluids, mental status gradually improving urine output improving.  Disposition:   Based on the above findings, I believe this patient is stable for  admission.    Patient/family educated about specific findings on our evaluation and explained exact reasons for admission.  Patient/family educated about clinical situation and time was allowed to answer questions.   Admission team communicated with and agreed with need for admission. Patient admitted. Patient ready to move at this time.     Emergency Department Medication Summary:   Medications  pantoprazole  (PROTONIX ) injection 40 mg (has no administration in time range)  sodium chloride  flush (NS) 0.9 % injection 3 mL (3 mLs Intravenous Given 05/21/23 2300)  acetaminophen  (TYLENOL ) tablet 650 mg (has no administration in time range)    Or  acetaminophen  (TYLENOL ) suppository 650 mg (has no administration in time range)  prochlorperazine  (COMPAZINE ) injection 10 mg (has no administration in time range)  senna-docusate (Senokot-S) tablet 1 tablet (has no administration in time range)  lactated ringers  bolus 1,000 mL (0 mLs Intravenous Stopped 05/21/23 2039)  pantoprazole  (PROTONIX ) injection 40 mg (40 mg Intravenous Given 05/21/23 1839)  0.9 %  sodium chloride  infusion (Manually program via Guardrails IV Fluids) ( Intravenous New Bag/Given 05/21/23 2300)         Clinical Impression:  1. UGIB (upper gastrointestinal bleed)      Admit   Final Clinical Impression(s) / ED Diagnoses Final diagnoses:  UGIB (upper gastrointestinal bleed)    Rx / DC Orders ED Discharge Orders     None         Jerral Meth, MD 05/22/23 [phone]

## 2023-05-21 NOTE — ED Notes (Signed)
 Pt bladder scanned 0 mL. Reported to MD.

## 2023-05-21 NOTE — H&P (Signed)
 History and Physical    Chloe Harding FMW:[id number] DOB: Apr 17, 1946 DOA: 05/21/2023  PCP: Leron Millman, NP  Patient coming from: Home  I have personally briefly reviewed patient's old medical records in [facility]  Chief Complaint: Fatigue, generalized weakness, black stool  HPI: Chloe Harding is a 78 y.o. female with medical history significant for chronic diastolic CHF (EF 39-34%), HTN, recurrent esophagitis/esophageal stenosis s/p multiple dilation procedures who presented to the ED for evaluation of generalized weakness, poor oral intake, dark black stool.  Patient states that she has been feeling progressively weak and fatigued over the last week.  She has been seeing dark black-colored stools without other obvious bleeding.  She denies any nausea, vomiting, abdominal pain.  She says she normally ambulates with the use of a walker.  She says she did have a recent fall but did not injure herself.  She says her appetite has been very poor.  She did not have enough energy to walk today.  She is not currently taking any blood thinners.  ED Course  Labs/Imaging on admission: I have personally reviewed following labs and imaging studies.  Initial vitals showed BP 157/98, pulse 131, RR 18, temp 98.3 F, SpO2 98% on room air.  Labs showed WBC 4.7, hemoglobin 7.6 (previously 11.6 on 08/02/2022), hematocrit 26.6, MCV 73.1, platelets 265,000, sodium 140, potassium 4.2, bicarb 21, BUN 21, creatinine 1.37 (previous 0.84 on 08/02/22), lactic acid 2.3.  Blood cultures in process.  SARS-CoV-2, influenza, RSV PCR negative.  Portable chest x-ray showed chronic elevation of the right hemidiaphragm and right basilar atelectasis.  Patient was given 1 L LR and IV Protonix  40 mg.  EDP discussed with on-call GI Dr. Rollin who recommended medical admission and he will see in consultation tomorrow.  The hospitalist service was consulted to admit for further evaluation and management.  Review of  Systems: All systems reviewed and are negative except as documented in history of present illness above.   Past Medical History:  Diagnosis Date   Aortic atherosclerosis (HCC)    Arthritis    Back pain 02/23/2020   Chronic combined systolic and diastolic CHF (congestive heart failure) (HCC)    Coronary atherosclerosis    Depression    Diabetes mellitus    Esophageal stricture    Fall    Hiatal hernia    Hypercholesteremia    Hypertension    Obesity    Pneumonia    Reflux    Renal disorder    shutting down 4 years ago   Subdural hematoma (HCC)    Thyroid  nodule    Urinary retention    UTI due to Klebsiella species     Past Surgical History:  Procedure Laterality Date   BALLOON DILATION N/A 01/25/2020   Procedure: BALLOON DILATION;  Surgeon: Rollin Dover, MD;  Location: [facility];  Service: Endoscopy;  Laterality: N/A;   BALLOON DILATION N/A 10/10/2021   Procedure: BALLOON DILATION;  Surgeon: Rollin Dover, MD;  Location: [facility];  Service: Gastroenterology;  Laterality: N/A;   BALLOON DILATION N/A 11/21/2021   Procedure: BALLOON DILATION;  Surgeon: Rollin Dover, MD;  Location: [facility];  Service: Gastroenterology;  Laterality: N/A;   BALLOON DILATION N/A 01/02/2022   Procedure: BALLOON DILATION;  Surgeon: Rollin Dover, MD;  Location: [facility];  Service: Gastroenterology;  Laterality: N/A;   BALLOON DILATION N/A 02/13/2022   Procedure: BALLOON DILATION;  Surgeon: Rollin Dover, MD;  Location: [facility];  Service: Gastroenterology;  Laterality: N/A;  CHOLECYSTECTOMY     COLONOSCOPY WITH PROPOFOL  N/A 05/20/2017   Procedure: COLONOSCOPY WITH PROPOFOL ;  Surgeon: Rollin Dover, MD;  Location: [facility];  Service: Endoscopy;  Laterality: N/A;   ESOPHAGOGASTRODUODENOSCOPY N/A 05/19/2017   Procedure: ESOPHAGOGASTRODUODENOSCOPY (EGD);  Surgeon: Kristie Lamprey, MD;  Location: [facility];  Service: Endoscopy;  Laterality: N/A;   ESOPHAGOGASTRODUODENOSCOPY (EGD) WITH  PROPOFOL  N/A 01/17/2019   Procedure: ESOPHAGOGASTRODUODENOSCOPY (EGD) WITH PROPOFOL ;  Surgeon: Rollin Dover, MD;  Location: [facility];  Service: Endoscopy;  Laterality: N/A;   ESOPHAGOGASTRODUODENOSCOPY (EGD) WITH PROPOFOL  N/A 01/25/2020   Procedure: ESOPHAGOGASTRODUODENOSCOPY (EGD) WITH PROPOFOL ;  Surgeon: Rollin Dover, MD;  Location: [facility];  Service: Endoscopy;  Laterality: N/A;   ESOPHAGOGASTRODUODENOSCOPY (EGD) WITH PROPOFOL  N/A 07/09/2020   Procedure: ESOPHAGOGASTRODUODENOSCOPY (EGD) WITH PROPOFOL ;  Surgeon: Rollin Dover, MD;  Location: [facility];  Service: Endoscopy;  Laterality: N/A;   ESOPHAGOGASTRODUODENOSCOPY (EGD) WITH PROPOFOL  N/A 10/10/2021   Procedure: ESOPHAGOGASTRODUODENOSCOPY (EGD) WITH PROPOFOL ;  Surgeon: Rollin Dover, MD;  Location: [facility];  Service: Gastroenterology;  Laterality: N/A;   ESOPHAGOGASTRODUODENOSCOPY (EGD) WITH PROPOFOL  N/A 11/21/2021   Procedure: ESOPHAGOGASTRODUODENOSCOPY (EGD) WITH PROPOFOL ;  Surgeon: Rollin Dover, MD;  Location: [facility];  Service: Gastroenterology;  Laterality: N/A;   ESOPHAGOGASTRODUODENOSCOPY (EGD) WITH PROPOFOL  N/A 01/02/2022   Procedure: ESOPHAGOGASTRODUODENOSCOPY (EGD) WITH PROPOFOL ;  Surgeon: Rollin Dover, MD;  Location: [facility];  Service: Gastroenterology;  Laterality: N/A;   ESOPHAGOGASTRODUODENOSCOPY (EGD) WITH PROPOFOL  N/A 02/13/2022   Procedure: ESOPHAGOGASTRODUODENOSCOPY (EGD) WITH PROPOFOL ;  Surgeon: Rollin Dover, MD;  Location: [facility];  Service: Gastroenterology;  Laterality: N/A;   SAVORY DILATION N/A 01/17/2019   Procedure: SAVORY DILATION;  Surgeon: Rollin Dover, MD;  Location: [facility];  Service: Endoscopy;  Laterality: N/A;    Social History:  reports that she has never smoked. She has never used smokeless tobacco. She reports that she does not drink alcohol  and does not use drugs.  Allergies  Allergen Reactions   Amlodipine Besylate Swelling    Norvasc     Family History  Problem  Relation Age of Onset   Heart attack Sister    Heart failure Sister    Heart attack Brother    Heart failure Brother      Prior to Admission medications   Medication Sig Start Date End Date Taking? Authorizing Provider  albuterol  (VENTOLIN  HFA) 108 (90 Base) MCG/ACT inhaler Inhale 1-2 puffs into the lungs every 4 (four) hours as needed for wheezing or shortness of breath. 06/28/20   [provider]  carvedilol  (COREG  CR) 40 MG 24 hr capsule Take 40 mg by mouth daily.    [provider]  cyclobenzaprine  (FLEXERIL ) 5 MG tablet Take 5 mg by mouth at bedtime. For restless leg and back pain 08/19/20   [provider]  doxycycline  (VIBRAMYCIN ) 100 MG capsule Take 1 capsule (100 mg total) by mouth 2 (two) times daily. One po bid x 7 days 07/10/22   Armenta Canning, MD  escitalopram  (LEXAPRO ) 20 MG tablet Take 20 mg by mouth daily. 06/21/20   [provider]  Magnesium  400 MG TABS Take 400 mg by mouth daily. 11/11/21   Ghimire, Kuber, MD  melatonin 5 MG TABS Take 5 mg by mouth at bedtime.    [provider]  Multiple Vitamins-Minerals (MULTIVITAMIN WITH MINERALS) tablet Take 1 tablet by mouth daily.    [provider]  pantoprazole  (PROTONIX ) 40 MG tablet Take 1 tablet (40 mg total) by mouth 2 (two) times daily. 02/18/20   Gonfa, Taye T, MD  potassium chloride  (KLOR-CON  M) 10 MEQ tablet Take 1 tablet (10 mEq total) by mouth 3 (three) times daily for 14 days. 11/11/21 11/25/21  Ghimire, Kuber, MD  ropinirole  (REQUIP ) 5 MG tablet Take 5 mg by mouth at bedtime. 08/19/20   [provider]  torsemide (DEMADEX) 5 MG tablet Take 5 mg by mouth daily. 09/18/21   [provider]    Physical Exam: Vitals:   05/21/23 1959 05/21/23 2118 05/21/23 2135 05/21/23 2219  BP:  (!) 134/90 138/89 (!) 143/95  Pulse:  (!) 125 (!) 126 (!) 126  Resp:  (!) 29 (!) 27 (!) 24  Temp: 99.7 F (37.6 C)  99.8 F (37.7 C) 98.4 F (36.9 C)  TempSrc: Oral  Oral Oral   SpO2:  96% 98% 98%  Weight:      Height:       Constitutional: Pale elderly woman resting in bed, appears fatigued but in NAD. Eyes: EOMI, conjunctival pallor ENMT: Mucous membranes are dry. Posterior pharynx clear of any exudate or lesions.Normal dentition.  Neck: normal, supple, no masses. Respiratory: clear to auscultation bilaterally, no wheezing, no crackles. Normal respiratory effort. No accessory muscle use.  Cardiovascular: Tachycardic, systolic murmur present. No extremity edema. 2+ pedal pulses. Abdomen: no tenderness, no masses palpated.  Musculoskeletal: no clubbing / cyanosis. No joint deformity upper and lower extremities. Good ROM, no contractures. Normal muscle tone.  Skin: Pale complexion.  No rashes, lesions, ulcers. No induration Neurologic: Sensation intact. Strength 5/5 in all 4.  Psychiatric: Alert and oriented x 3. Normal mood.   EKG: Personally reviewed. Sinus tachycardia, rate 129, low voltage, no acute ischemic changes.  Rate is faster when compared to prior.  Assessment/Plan Principal Problem:   Acute on chronic blood loss anemia Active Problems:   Essential hypertension   AKI (acute kidney injury) (HCC)   Chronic diastolic CHF (congestive heart failure) (HCC)   Chloe Harding is a 78 y.o. female with medical history significant for chronic diastolic CHF (EF 39-34%), HTN, recurrent esophagitis/esophageal stenosis s/p multiple dilation procedures who is admitted with symptomatic acute on chronic anemia with suspected upper GI bleeding.  Assessment and Plan: Symptomatic acute on chronic blood loss anemia Suspected upper GI bleeding History of recurrent esophageal stenosis: Hemoglobin 7.6 compared to previous 11.6 in March 2024.  She has been progressively fatigued and has seen melena at home.  Given tachycardia and history of cardiac disease we will go ahead and transfuse 1 unit PRBC. -Transfuse 1 unit PRBC -Repeat CBC posttransfusion -Continue IV  Protonix  40 mg BID -EDP discussed with GI Dr. Rollin who will consult in a.m. with potential plan for EGD -Keep n.p.o. after midnight  Acute kidney injury: Creatinine 1.37 on admission compared to baseline ~0.8.  Suspect secondary to anemia.  She received 1 L LR in the ED.  Follow labs after PRBC transfusion.  Chronic diastolic CHF: Appears hypovolemic on admission.  TTE in June 2023 showed EF 60-65%, G2DD. -Holding diuretics for now, and resume as soon as necessary -Resume Coreg  tomorrow as BP allows  Hypertension: BP currently stable, holding antihypertensives for now in setting of GI bleed.    DVT prophylaxis: SCDs Start: 05/21/23 2035 Code Status: Full code, discussed with patient on admission Family Communication: Discussed with patient, she has discussed with family Disposition Plan: From home, dispo pending clinical progress Consults called: GI Dr. Rollin Severity of Illness: The appropriate patient status for this patient is INPATIENT. Inpatient status is judged to be reasonable and necessary in  order to provide the required intensity of service to ensure the patient's safety. The patient's presenting symptoms, physical exam findings, and initial radiographic and laboratory data in the context of their chronic comorbidities is felt to place them at high risk for further clinical deterioration. Furthermore, it is not anticipated that the patient will be medically stable for discharge from the hospital within 2 midnights of admission.   * I certify that at the point of admission it is my clinical judgment that the patient will require inpatient hospital care spanning beyond 2 midnights from the point of admission due to high intensity of service, high risk for further deterioration and high frequency of surveillance required.DEWAINE Jorie Blanch MD [facility]  If 7PM-7AM, please contact night-coverage [email or web address]  05/21/2023, 10:59 PM

## 2023-05-21 NOTE — ED Notes (Signed)
 Pt spouse, Verneta Hamidi, was called to update about pt status.

## 2023-05-21 NOTE — ED Notes (Signed)
 CRITICAL VALUE STICKER  CRITICAL VALUE: Lactic 2.3  RECEIVER (on-site recipient of call): Dr. Doran Durand  DATE & TIME NOTIFIED: [phone] 05/21/23  MESSENGER (representative from lab):  MD NOTIFIED: Dr. Doran Durand  TIME OF NOTIFICATION:  RESPONSE:

## 2023-05-21 NOTE — ED Notes (Addendum)
 This RN attempted to call an update to husband, Raiford Noble, about pt's room. Call went to voicemail.

## 2023-05-21 NOTE — ED Triage Notes (Signed)
 Weakness over the last couple days, decreased appetite. Pt usually ambulates at home with cane or walker and has not had enough energy today to walk. Denies V/D.

## 2023-05-21 NOTE — Hospital Course (Addendum)
 78 year old woman PMH including esophagitis, esophageal stenosis admitted with acute on chronic anemia and concern for GI bleed.  Consultants GI Dr Elnoria Howard  Procedures/Events

## 2023-05-22 DIAGNOSIS — N179 Acute kidney failure, unspecified: Secondary | ICD-10-CM

## 2023-05-22 DIAGNOSIS — K209 Esophagitis, unspecified without bleeding: Secondary | ICD-10-CM | POA: Diagnosis not present

## 2023-05-22 DIAGNOSIS — D62 Acute posthemorrhagic anemia: Secondary | ICD-10-CM | POA: Diagnosis not present

## 2023-05-22 LAB — URINALYSIS, ROUTINE W REFLEX MICROSCOPIC
Bacteria, UA: NONE SEEN
Bilirubin Urine: NEGATIVE
Glucose, UA: NEGATIVE mg/dL
Ketones, ur: 5 mg/dL — AB
Leukocytes,Ua: NEGATIVE
Nitrite: NEGATIVE
Protein, ur: 100 mg/dL — AB
Specific Gravity, Urine: 1.015 (ref 1.005–1.030)
pH: 5 (ref 5.0–8.0)

## 2023-05-22 LAB — CBC
HCT: 33.7 % — ABNORMAL LOW (ref 36.0–46.0)
Hemoglobin: 9.9 g/dL — ABNORMAL LOW (ref 12.0–15.0)
MCH: 21.8 pg — ABNORMAL LOW (ref 26.0–34.0)
MCHC: 29.4 g/dL — ABNORMAL LOW (ref 30.0–36.0)
MCV: 74.2 fL — ABNORMAL LOW (ref 80.0–100.0)
Platelets: 223 10*3/uL (ref 150–400)
RBC: 4.54 MIL/uL (ref 3.87–5.11)
RDW: 20.6 % — ABNORMAL HIGH (ref 11.5–15.5)
WBC: 5.2 10*3/uL (ref 4.0–10.5)
nRBC: 0 % (ref 0.0–0.2)

## 2023-05-22 LAB — BASIC METABOLIC PANEL
Anion gap: 11 (ref 5–15)
BUN: 19 mg/dL (ref 8–23)
CO2: 19 mmol/L — ABNORMAL LOW (ref 22–32)
Calcium: 9.9 mg/dL (ref 8.9–10.3)
Chloride: 108 mmol/L (ref 98–111)
Creatinine, Ser: 1.28 mg/dL — ABNORMAL HIGH (ref 0.44–1.00)
GFR, Estimated: 43 mL/min — ABNORMAL LOW (ref >60)
Glucose, Bld: 82 mg/dL (ref 70–99)
Potassium: 3.9 mmol/L (ref 3.5–5.1)
Sodium: 138 mmol/L (ref 135–145)

## 2023-05-22 LAB — IRON AND TIBC
Iron: 78 ug/dL (ref 28–170)
Saturation Ratios: 17 % (ref 10.4–31.8)
TIBC: 458 ug/dL — ABNORMAL HIGH (ref 250–450)
UIBC: 380 ug/dL

## 2023-05-22 LAB — FERRITIN: Ferritin: 13 ng/mL (ref 11–307)

## 2023-05-22 MED ORDER — ALBUTEROL SULFATE (2.5 MG/3ML) 0.083% IN NEBU
2.5000 mg | INHALATION_SOLUTION | RESPIRATORY_TRACT | Status: DC | PRN
  Administered 2023-05-24: 2.5 mg via RESPIRATORY_TRACT
  Filled 2023-05-22: qty 3

## 2023-05-22 MED ORDER — CARVEDILOL PHOSPHATE ER 20 MG PO CP24: 40.0000 mg | ORAL_CAPSULE | Freq: Every day | ORAL | Status: DC

## 2023-05-22 MED ORDER — CARVEDILOL 3.125 MG PO TABS
3.1250 mg | ORAL_TABLET | Freq: Two times a day (BID) | ORAL | Status: DC
  Administered 2023-05-22 – 2023-05-23 (×2): 3.125 mg via ORAL
  Filled 2023-05-22 (×2): qty 1

## 2023-05-22 NOTE — Plan of Care (Signed)
  Problem: Education: Goal: Knowledge of General Education information will improve Description: Including pain rating scale, medication(s)/side effects and non-pharmacologic comfort measures Outcome: Progressing   Problem: Health Behavior/Discharge Planning: Goal: Ability to manage health-related needs will improve Outcome: Progressing   Problem: Activity: Goal: Risk for activity intolerance will decrease Outcome: Progressing   Problem: Coping: Goal: Level of anxiety will decrease Outcome: Progressing   Problem: Pain Management: Goal: General experience of comfort will improve Outcome: Progressing

## 2023-05-22 NOTE — Progress Notes (Addendum)
  Progress Note   Patient: Chloe Harding FMW:[id number] DOB: [birth year] DOA: 05/21/2023     1 DOS: the patient was seen and examined on 05/22/2023   Brief hospital course:   Assessment and Plan: Symptomatic acute on chronic blood loss anemia Suspected upper GI bleeding LA grade D esophagitis and strictures. Seen by gastroenterology, no plan for repeat EGD at this time.  Continue PPI.  Advance to regular diet.  Likely home tomorrow if CBC stable.   Acute kidney injury: Creatinine 1.37 on admission compared to baseline ~0.8.  Improved today.  Expect spontaneous resolution.   Chronic diastolic CHF: Appears hypovolemic on admission.  TTE in June 2023 showed EF 60-65%, G2DD. -Holding diuretics for now, and resume as soon as necessary   Hypertension: BP currently stable, holding antihypertensives for now in setting of GI bleed.       Subjective:  Feels ok No bleeding  Physical Exam: Vitals:   05/22/23 0500 05/22/23 0830 05/22/23 1204 05/22/23 1608  BP:  122/79 (!) 148/90 137/89  Pulse:  (!) 120 (!) 124 (!) 120  Resp:  (!) 23    Temp:  98.1 F (36.7 C) 98.8 F (37.1 C) 98 F (36.7 C)  TempSrc:  Oral Oral Oral  SpO2:  98% 100% 98%  Weight: 64.8 kg     Height:       Physical Exam Vitals reviewed.  Constitutional:      General: She is not in acute distress.    Appearance: She is not ill-appearing or toxic-appearing.  Cardiovascular:     Rate and Rhythm: Normal rate and regular rhythm.     Heart sounds: No murmur heard. Pulmonary:     Effort: Pulmonary effort is normal. No respiratory distress.     Breath sounds: No wheezing, rhonchi or rales.  Neurological:     Mental Status: She is alert.  Psychiatric:        Mood and Affect: Mood normal.        Behavior: Behavior normal.     Data Reviewed: CBG stable Creatnine 1.28, stable Hgb up to 9.9  Family Communication: none  Disposition: Status is: Inpatient Remains inpatient appropriate because:  GIB     Time spent: 20 minutes  Author: Toribio Door, MD 05/22/2023 6:14 PM  For on call review [email or web address].

## 2023-05-22 NOTE — Plan of Care (Signed)
  Problem: Clinical Measurements: Goal: Will remain free from infection Outcome: Progressing   Problem: Elimination: Goal: Will not experience complications related to bowel motility Outcome: Progressing   Problem: Pain Management: Goal: General experience of comfort will improve Outcome: Progressing   Problem: Safety: Goal: Ability to remain free from injury will improve Outcome: Progressing   Problem: Skin Integrity: Goal: Risk for impaired skin integrity will decrease Outcome: Progressing

## 2023-05-22 NOTE — Consult Note (Signed)
 Reason for Consult: Anemia and melena Referring Physician: [facility]  Chloe Harding HPI: This is a 78 year old female with a PMH of persistent esophageal strictures, LA Grade D esophagitis, and multiple other medical issues admitted with weakness, poor PO intake, and some melena.  Her current presentation is consistent with the prior admissions.  During those admissions that an EGD was performed, it did not alter the management of her hospitalization.  At this time the patient denies any issues with dysphagia.  She states that she does use her pantoprazole  BID, but she takes it after her PO intake.  Past Medical History:  Diagnosis Date   Aortic atherosclerosis (HCC)    Arthritis    Back pain 02/23/2020   Chronic combined systolic and diastolic CHF (congestive heart failure) (HCC)    Coronary atherosclerosis    Depression    Diabetes mellitus    Esophageal stricture    Fall    Hiatal hernia    Hypercholesteremia    Hypertension    Obesity    Pneumonia    Reflux    Renal disorder    shutting down 4 years ago   Subdural hematoma (HCC)    Thyroid  nodule    Urinary retention    UTI due to Klebsiella species     Past Surgical History:  Procedure Laterality Date   BALLOON DILATION N/A 01/25/2020   Procedure: BALLOON DILATION;  Surgeon: Rollin Dover, MD;  Location: [facility];  Service: Endoscopy;  Laterality: N/A;   BALLOON DILATION N/A 10/10/2021   Procedure: BALLOON DILATION;  Surgeon: Rollin Dover, MD;  Location: [facility];  Service: Gastroenterology;  Laterality: N/A;   BALLOON DILATION N/A 11/21/2021   Procedure: BALLOON DILATION;  Surgeon: Rollin Dover, MD;  Location: [facility];  Service: Gastroenterology;  Laterality: N/A;   BALLOON DILATION N/A 01/02/2022   Procedure: BALLOON DILATION;  Surgeon: Rollin Dover, MD;  Location: [facility];  Service: Gastroenterology;  Laterality: N/A;   BALLOON DILATION N/A 02/13/2022   Procedure: BALLOON DILATION;  Surgeon:  Rollin Dover, MD;  Location: [facility];  Service: Gastroenterology;  Laterality: N/A;   CHOLECYSTECTOMY     COLONOSCOPY WITH PROPOFOL  N/A 05/20/2017   Procedure: COLONOSCOPY WITH PROPOFOL ;  Surgeon: Rollin Dover, MD;  Location: [facility];  Service: Endoscopy;  Laterality: N/A;   ESOPHAGOGASTRODUODENOSCOPY N/A 05/19/2017   Procedure: ESOPHAGOGASTRODUODENOSCOPY (EGD);  Surgeon: Kristie Lamprey, MD;  Location: [facility]COPY;  Service: Endoscopy;  Laterality: N/A;   ESOPHAGOGASTRODUODENOSCOPY (EGD) WITH PROPOFOL  N/A 01/17/2019   Procedure: ESOPHAGOGASTRODUODENOSCOPY (EGD) WITH PROPOFOL ;  Surgeon: Rollin Dover, MD;  Location: [facility];  Service: Endoscopy;  Laterality: N/A;   ESOPHAGOGASTRODUODENOSCOPY (EGD) WITH PROPOFOL  N/A 01/25/2020   Procedure: ESOPHAGOGASTRODUODENOSCOPY (EGD) WITH PROPOFOL ;  Surgeon: Rollin Dover, MD;  Location: [facility];  Service: Endoscopy;  Laterality: N/A;   ESOPHAGOGASTRODUODENOSCOPY (EGD) WITH PROPOFOL  N/A 07/09/2020   Procedure: ESOPHAGOGASTRODUODENOSCOPY (EGD) WITH PROPOFOL ;  Surgeon: Rollin Dover, MD;  Location: [facility];  Service: Endoscopy;  Laterality: N/A;   ESOPHAGOGASTRODUODENOSCOPY (EGD) WITH PROPOFOL  N/A 10/10/2021   Procedure: ESOPHAGOGASTRODUODENOSCOPY (EGD) WITH PROPOFOL ;  Surgeon: Rollin Dover, MD;  Location: [facility];  Service: Gastroenterology;  Laterality: N/A;   ESOPHAGOGASTRODUODENOSCOPY (EGD) WITH PROPOFOL  N/A 11/21/2021   Procedure: ESOPHAGOGASTRODUODENOSCOPY (EGD) WITH PROPOFOL ;  Surgeon: Rollin Dover, MD;  Location: [facility];  Service: Gastroenterology;  Laterality: N/A;   ESOPHAGOGASTRODUODENOSCOPY (EGD) WITH PROPOFOL  N/A 01/02/2022   Procedure: ESOPHAGOGASTRODUODENOSCOPY (EGD) WITH PROPOFOL ;  Surgeon: Rollin Dover, MD;  Location: [facility];  Service: Gastroenterology;  Laterality: N/A;   ESOPHAGOGASTRODUODENOSCOPY (EGD) WITH PROPOFOL  N/A 02/13/2022   Procedure: ESOPHAGOGASTRODUODENOSCOPY (EGD) WITH PROPOFOL ;  Surgeon: Rollin Dover, MD;  Location: [facility];  Service: Gastroenterology;  Laterality: N/A;   SAVORY DILATION N/A 01/17/2019   Procedure: SAVORY DILATION;  Surgeon: Rollin Dover, MD;  Location: [facility];  Service: Endoscopy;  Laterality: N/A;    Family History  Problem Relation Age of Onset   Heart attack Sister    Heart failure Sister    Heart attack Brother    Heart failure Brother     Social History:  reports that she has never smoked. She has never used smokeless tobacco. She reports that she does not drink alcohol  and does not use drugs.  Allergies:  Allergies  Allergen Reactions   Amlodipine Besylate Swelling    Norvasc     Medications: Scheduled:  pantoprazole  (PROTONIX ) IV  40 mg Intravenous Q12H   sodium chloride  flush  3 mL Intravenous Q12H   Continuous:  Results for orders placed or performed during the hospital encounter of 05/21/23 (from the past 24 hours)  CBG monitoring, ED     Status: None   Collection Time: 05/21/23  3:52 PM  Result Value Ref Range   Glucose-Capillary 77 70 - 99 mg/dL  Basic metabolic panel     Status: Abnormal   Collection Time: 05/21/23  5:02 PM  Result Value Ref Range   Sodium 140 135 - 145 mmol/L   Potassium 4.2 3.5 - 5.1 mmol/L   Chloride 108 98 - 111 mmol/L   CO2 21 (L) 22 - 32 mmol/L   Glucose, Bld 101 (H) 70 - 99 mg/dL   BUN 21 8 - 23 mg/dL   Creatinine, Ser 8.62 (H) 0.44 - 1.00 mg/dL   Calcium  10.3 8.9 - 10.3 mg/dL   GFR, Estimated 40 (L) >60 mL/min   Anion gap 11 5 - 15  CBC     Status: Abnormal   Collection Time: 05/21/23  5:02 PM  Result Value Ref Range   WBC 4.7 4.0 - 10.5 K/uL   RBC 3.69 (L) 3.87 - 5.11 MIL/uL   Hemoglobin 7.6 (L) 12.0 - 15.0 g/dL   HCT 73.3 (L) 63.9 - 53.9 %   MCV 72.1 (L) 80.0 - 100.0 fL   MCH 20.6 (L) 26.0 - 34.0 pg   MCHC 28.6 (L) 30.0 - 36.0 g/dL   RDW 80.3 (H) 88.4 - 84.4 %   Platelets 265 150 - 400 K/uL   nRBC 0.0 0.0 - 0.2 %  Hepatic function panel     Status: Abnormal   Collection Time:  05/21/23  5:02 PM  Result Value Ref Range   Total Protein 7.5 6.5 - 8.1 g/dL   Albumin 3.5 3.5 - 5.0 g/dL   AST 21 15 - 41 U/L   ALT 18 0 - 44 U/L   Alkaline Phosphatase 110 38 - 126 U/L   Total Bilirubin 1.2 0.0 - 1.2 mg/dL   Bilirubin, Direct 0.4 (H) 0.0 - 0.2 mg/dL   Indirect Bilirubin 0.8 0.3 - 0.9 mg/dL  Blood culture (routine x 2)     Status: None (Preliminary result)   Collection Time: 05/21/23  5:02 PM   Specimen: Right Antecubital; Blood  Result Value Ref Range   Specimen Description      RIGHT ANTECUBITAL BOTTLES DRAWN AEROBIC AND ANAEROBIC Performed at [facility], [address] [place], [place] [address]    Special Requests  Blood Culture adequate volume Performed at [facility], [address] [place], [place] [address]    Culture      NO GROWTH < 12 HOURS Performed at [facility], [address] [place], [place] [address]    Report Status PENDING   Blood culture (routine x 2)     Status: None (Preliminary result)   Collection Time: 05/21/23  5:10 PM   Specimen: Site Not Specified; Blood  Result Value Ref Range   Specimen Description      SITE NOT SPECIFIED BOTTLES DRAWN AEROBIC AND ANAEROBIC Performed at [facility], [address], [place], [place] [address]    Special Requests      Blood Culture results may not be optimal due to an inadequate volume of blood received in culture bottles Performed at [facility], [address], [place], [place] [address]    Culture      NO GROWTH < 12 HOURS Performed at [facility], [address] [place], [place] [address]    Report Status PENDING   Type and screen [facility]     Status: None (Preliminary result)   Collection Time: 05/21/23  5:37 PM  Result Value Ref Range   ABO/RH(D) O POS    Antibody Screen NEG    Sample Expiration 2023-06-10,2359    Unit Number [id number]    Blood Component  Type RBC LR PHER1    Unit division 00    Status of Unit ISSUED    Transfusion Status OK TO TRANSFUSE    Crossmatch Result      Compatible Performed at [facility], [address] [place], [place] [address]   CBG monitoring, ED     Status: None   Collection Time: 05/21/23  5:47 PM  Result Value Ref Range   Glucose-Capillary 77 70 - 99 mg/dL  I-Stat CG4 Lactic Acid     Status: Abnormal   Collection Time: 05/21/23  6:04 PM  Result Value Ref Range   Lactic Acid, Venous 2.3 (HH) 0.5 - 1.9 mmol/L   Comment NOTIFIED PHYSICIAN   Resp panel by RT-PCR (RSV, Flu A&B, Covid) Anterior Nasal Swab     Status: None   Collection Time: 05/21/23  6:42 PM   Specimen: Anterior Nasal Swab  Result Value Ref Range   SARS Coronavirus 2 by RT PCR NEGATIVE NEGATIVE   Influenza A by PCR NEGATIVE NEGATIVE   Influenza B by PCR NEGATIVE NEGATIVE   Resp Syncytial Virus by PCR NEGATIVE NEGATIVE  Prepare RBC (crossmatch)     Status: None   Collection Time: 05/21/23  8:34 PM  Result Value Ref Range   Order Confirmation      ORDER PROCESSED BY BLOOD BANK Performed at [facility], [address] [place], [place] [address]   Basic metabolic panel     Status: Abnormal   Collection Time: 05/22/23  5:17 AM  Result Value Ref Range   Sodium 138 135 - 145 mmol/L   Potassium 3.9 3.5 - 5.1 mmol/L   Chloride 108 98 - 111 mmol/L   CO2 19 (L) 22 - 32 mmol/L   Glucose, Bld 82 70 - 99 mg/dL   BUN 19 8 - 23 mg/dL   Creatinine, Ser 8.71 (H) 0.44 - 1.00 mg/dL   Calcium  9.9 8.9 - 10.3 mg/dL   GFR, Estimated 43 (L) >60 mL/min   Anion gap 11 5 - 15  CBC     Status: Abnormal   Collection Time: 05/22/23  5:17 AM  Result Value Ref Range   WBC 5.2 4.0 - 10.5 K/uL   RBC 4.54 3.87 - 5.11 MIL/uL   Hemoglobin 9.9 (L) 12.0 - 15.0 g/dL   HCT 66.2 (L) 63.9 - 53.9 %   MCV 74.2 (L) 80.0 - 100.0 fL   MCH 21.8 (L) 26.0 - 34.0 pg   MCHC 29.4 (L) 30.0 - 36.0 g/dL   RDW 79.3 (H) 88.4 - 84.4 %    Platelets 223 150 - 400 K/uL   nRBC 0.0 0.0 - 0.2 %  Iron and TIBC     Status: Abnormal   Collection Time: 05/22/23  5:17 AM  Result Value Ref Range   Iron 78 28 - 170 ug/dL   TIBC 541 (H) 749 - 549 ug/dL   Saturation Ratios 17 10.4 - 31.8 %   UIBC 380 ug/dL  Ferritin     Status: None   Collection Time: 05/22/23  5:17 AM  Result Value Ref Range   Ferritin 13 11 - 307 ng/mL     DG Chest Portable 1 View Result Date: 05/21/2023 CLINICAL DATA:  Shortness of breath EXAM: PORTABLE CHEST 1 VIEW COMPARISON:  08/02/2022.  CT 07/10/2022. FINDINGS: Elevation of the right hemidiaphragm with interposition of the colon again noted as seen on prior CT. Right basilar airspace opacity. No confluent opacity on the left. No visible effusions. Heart and mediastinal contours within normal limits. Aortic atherosclerosis. No acute bony abnormality. IMPRESSION: Chronic elevation of the right hemidiaphragm. Right basilar atelectasis or infiltrate. Electronically Signed   By: Franky Crease M.D.   On: 05/21/2023 19:08    ROS:  As stated above in the HPI otherwise negative.  Blood pressure (!) 148/90, pulse (!) 124, temperature 98.8 F (37.1 C), temperature source Oral, resp. rate (!) 23, height 5' (1.524 m), weight 64.8 kg, SpO2 100%.    PE: Gen: NAD, Alert and Oriented HEENT:  [place]/AT, EOMI Neck: Supple, no LAD Lungs: CTA Bilaterally CV: RRR without M/G/R ABD: Soft, NTND, +BS Ext: No C/C/E  Assessment/Plan: 1) Anemia. 2) Melena. 3) History of LA Grade D esophagitis and strictures.   She feels better after the blood transfusions and she desires to eat.  At this time there is no plan to repeat the EGD.  She was again reminded to take her PPI at least 30 minutes before breakfast and dinner.  Plan: 1) Advance to a regular diet. 2) Upon discharge follow up in the office in 1-2 weeks.  Concha Sudol D 05/22/2023, 1:56 PM

## 2023-05-23 DIAGNOSIS — I10 Essential (primary) hypertension: Secondary | ICD-10-CM | POA: Diagnosis not present

## 2023-05-23 DIAGNOSIS — I5032 Chronic diastolic (congestive) heart failure: Secondary | ICD-10-CM

## 2023-05-23 DIAGNOSIS — D62 Acute posthemorrhagic anemia: Secondary | ICD-10-CM | POA: Diagnosis not present

## 2023-05-23 DIAGNOSIS — N179 Acute kidney failure, unspecified: Secondary | ICD-10-CM | POA: Diagnosis not present

## 2023-05-23 LAB — CBC
HCT: 30.8 % — ABNORMAL LOW (ref 36.0–46.0)
Hemoglobin: 9 g/dL — ABNORMAL LOW (ref 12.0–15.0)
MCH: 22 pg — ABNORMAL LOW (ref 26.0–34.0)
MCHC: 29.2 g/dL — ABNORMAL LOW (ref 30.0–36.0)
MCV: 75.1 fL — ABNORMAL LOW (ref 80.0–100.0)
Platelets: 197 10*3/uL (ref 150–400)
RBC: 4.1 MIL/uL (ref 3.87–5.11)
RDW: 20.6 % — ABNORMAL HIGH (ref 11.5–15.5)
WBC: 5.1 10*3/uL (ref 4.0–10.5)
nRBC: 0.4 % — ABNORMAL HIGH (ref 0.0–0.2)

## 2023-05-23 LAB — BASIC METABOLIC PANEL
Anion gap: 12 (ref 5–15)
BUN: 25 mg/dL — ABNORMAL HIGH (ref 8–23)
CO2: 18 mmol/L — ABNORMAL LOW (ref 22–32)
Calcium: 8.8 mg/dL — ABNORMAL LOW (ref 8.9–10.3)
Chloride: 108 mmol/L (ref 98–111)
Creatinine, Ser: 1.69 mg/dL — ABNORMAL HIGH (ref 0.44–1.00)
GFR, Estimated: 31 mL/min — ABNORMAL LOW (ref >60)
Glucose, Bld: 94 mg/dL (ref 70–99)
Potassium: 4.2 mmol/L (ref 3.5–5.1)
Sodium: 138 mmol/L (ref 135–145)

## 2023-05-23 MED ORDER — LACTATED RINGERS IV SOLN: INTRAVENOUS | Status: DC

## 2023-05-23 MED ORDER — CARVEDILOL 6.25 MG PO TABS
6.2500 mg | ORAL_TABLET | Freq: Two times a day (BID) | ORAL | Status: DC
  Administered 2023-05-23 – 2023-05-24 (×3): 6.25 mg via ORAL
  Filled 2023-05-23 (×3): qty 1

## 2023-05-23 MED ORDER — CARVEDILOL 3.125 MG PO TABS
3.1250 mg | ORAL_TABLET | Freq: Once | ORAL | Status: AC
  Administered 2023-05-23: 3.125 mg via ORAL
  Filled 2023-05-23: qty 1

## 2023-05-23 NOTE — Progress Notes (Signed)
  Progress Note   Patient: Chloe Harding FMW:[id number] DOB: 07-Apr-1946 DOA: 05/21/2023     2 DOS: the patient was seen and examined on 05/23/2023   Brief hospital course: 78 year old woman PMH including esophagitis, esophageal stenosis admitted with acute on chronic anemia and concern for GI bleed.  Consultants GI Dr Rollin  Procedures/Events   Assessment and Plan: Symptomatic acute on chronic blood loss anemia Suspected upper GI bleeding LA grade D esophagitis and strictures. Seen by gastroenterology, no plan for repeat EGD at this time.  Continue PPI.  Advanced to regular diet.  No further bleeding.  Hemoglobin down just a bit but may be natural variation only.  Repeat CBC in AM.   Acute kidney injury: Creatinine 1.37 on admission compared to baseline ~0.8.  Higher today.  Not on any potential nephrotoxic agents.  Start IV fluids.  May be failure to thrive.  Repeat BMP in AM.  If not better we will proceed with further workup.   Chronic diastolic CHF: Appeared hypovolemic on admission.  TTE in June 2023 showed EF 60-65%, G2DD. Monitor volume status.  IV fluids as above.  Diuretics on hold.   Hypertension: Hypertensive.  Coreg  increased.  Monitor.     Subjective:  Feels ok   Physical Exam: Vitals:   05/23/23 0536 05/23/23 0912 05/23/23 1109 05/23/23 1256  BP: (!) 143/93 (!) 155/100 (!) 140/125   Pulse: (!) 120 (!) 126 (!) 124 98  Resp: 20  20   Temp: 98.6 F (37 C)  98.8 F (37.1 C)   TempSrc: Oral  Oral   SpO2: 96%  100%   Weight:      Height:       Physical Exam Vitals reviewed.  Constitutional:      General: She is not in acute distress.    Appearance: She is not ill-appearing or toxic-appearing.  Cardiovascular:     Rate and Rhythm: Normal rate and regular rhythm.     Heart sounds: No murmur heard. Pulmonary:     Effort: No respiratory distress.     Breath sounds: No wheezing, rhonchi or rales.  Neurological:     Mental Status: She is alert.   Psychiatric:        Mood and Affect: Mood normal.        Behavior: Behavior normal.     Data Reviewed: BMP notable for creatinine up to 1.69 Hemoglobin down to 9.0.  WBC and platelets within normal limits.  Family Communication: Husband at bedside  Disposition: Status is: Inpatient Remains inpatient appropriate because: AKI     Time spent: 52 78 year old woman PMH including esophagitis, esophageal stenosis minutes  Author: Toribio Door, MD 05/23/2023 5:23 PM  For on call review [email or web address].

## 2023-05-24 ENCOUNTER — Inpatient Hospital Stay (HOSPITAL_COMMUNITY): Payer: Medicare Other

## 2023-05-24 DIAGNOSIS — N179 Acute kidney failure, unspecified: Secondary | ICD-10-CM | POA: Diagnosis not present

## 2023-05-24 DIAGNOSIS — D62 Acute posthemorrhagic anemia: Secondary | ICD-10-CM | POA: Diagnosis not present

## 2023-05-24 DIAGNOSIS — I5032 Chronic diastolic (congestive) heart failure: Secondary | ICD-10-CM | POA: Diagnosis not present

## 2023-05-24 LAB — BPAM RBC
Blood Product Expiration Date: 202501132359
ISSUE DATE / TIME: 202501032103
Unit Type and Rh: 9500

## 2023-05-24 LAB — TYPE AND SCREEN
ABO/RH(D): O POS
Antibody Screen: NEGATIVE
Unit division: 0

## 2023-05-24 LAB — GLUCOSE, CAPILLARY: Glucose-Capillary: 162 mg/dL — ABNORMAL HIGH (ref 70–99)

## 2023-05-24 LAB — CBC
HCT: 33.8 % — ABNORMAL LOW (ref 36.0–46.0)
Hemoglobin: 10 g/dL — ABNORMAL LOW (ref 12.0–15.0)
MCH: 22.1 pg — ABNORMAL LOW (ref 26.0–34.0)
MCHC: 29.6 g/dL — ABNORMAL LOW (ref 30.0–36.0)
MCV: 74.8 fL — ABNORMAL LOW (ref 80.0–100.0)
Platelets: 227 10*3/uL (ref 150–400)
RBC: 4.52 MIL/uL (ref 3.87–5.11)
RDW: 21.5 % — ABNORMAL HIGH (ref 11.5–15.5)
WBC: 6.5 10*3/uL (ref 4.0–10.5)
nRBC: 0 % (ref 0.0–0.2)

## 2023-05-24 LAB — BASIC METABOLIC PANEL
Anion gap: 15 (ref 5–15)
BUN: 27 mg/dL — ABNORMAL HIGH (ref 8–23)
CO2: 15 mmol/L — ABNORMAL LOW (ref 22–32)
Calcium: 8.8 mg/dL — ABNORMAL LOW (ref 8.9–10.3)
Chloride: 108 mmol/L (ref 98–111)
Creatinine, Ser: 1.9 mg/dL — ABNORMAL HIGH (ref 0.44–1.00)
GFR, Estimated: 27 mL/min — ABNORMAL LOW (ref >60)
Glucose, Bld: 70 mg/dL (ref 70–99)
Potassium: 4.2 mmol/L (ref 3.5–5.1)
Sodium: 138 mmol/L (ref 135–145)

## 2023-05-24 MED ORDER — ENSURE ENLIVE PO LIQD
237.0000 mL | Freq: Two times a day (BID) | ORAL | Status: DC
  Administered 2023-05-24 (×2): 237 mL via ORAL

## 2023-05-24 MED ORDER — NOREPINEPHRINE 4 MG/250ML-% IV SOLN
2.0000 ug/min | INTRAVENOUS | Status: DC
  Filled 2023-05-24: qty 250

## 2023-05-24 MED ORDER — CYCLOBENZAPRINE HCL 5 MG PO TABS
5.0000 mg | ORAL_TABLET | Freq: Once | ORAL | Status: AC
  Administered 2023-05-24: 5 mg via ORAL
  Filled 2023-05-24: qty 1

## 2023-05-24 MED ORDER — LACTATED RINGERS IV BOLUS
1000.0000 mL | Freq: Once | INTRAVENOUS | Status: AC
  Administered 2023-05-24: 1000 mL via INTRAVENOUS

## 2023-05-24 MED ORDER — NOREPINEPHRINE 4 MG/250ML-% IV SOLN
INTRAVENOUS | Status: AC
  Filled 2023-05-24: qty 250

## 2023-05-24 MED ORDER — SODIUM CHLORIDE 0.9 % IV SOLN: 250.0000 mL | INTRAVENOUS | Status: DC

## 2023-05-24 MED ORDER — LACTATED RINGERS IV SOLN: INTRAVENOUS | Status: DC

## 2023-05-25 MED FILL — Medication: Qty: 2 | Status: AC

## 2023-05-26 LAB — CULTURE, BLOOD (ROUTINE X 2)
Culture: NO GROWTH
Culture: NO GROWTH
Special Requests: ADEQUATE

## 2023-06-19 NOTE — TOC CM/SW Note (Signed)
 Transition of Care [facility]) - Inpatient Brief Assessment   Patient Details  Name: Chloe Harding MRN: [id number] Date of Birth: August 18, 1945  Transition of Care [facility]) CM/SW Contact:    Tawni CHRISTELLA Eva, LCSW Phone Number: 06/21/23, 3:10 PM   Clinical Narrative:  Transition of Care Department [facility]) has reviewed patient and no TOC needs have been identified at this time. We will continue to monitor patient advancement through interdisciplinary progression rounds. If new patient transition needs arise, please place a TOC consult.  Transition of Care Asessment: Insurance and Status: Insurance coverage has been reviewed Patient has primary care physician: Yes Home environment has been reviewed: from home with spouse Prior level of function:: mod independent Prior/Current Home Services: No current home services Social Drivers of Health Review: SDOH reviewed no interventions necessary Readmission risk has been reviewed: Yes Transition of care needs: no transition of care needs at this time

## 2023-06-19 NOTE — ED Provider Notes (Signed)
.  Critical Care  Performed by: Ruthe Cornet, DO Authorized by: Ruthe Cornet, DO   Critical care provider statement:    Critical care time (minutes):  50   Critical care was necessary to treat or prevent imminent or life-threatening deterioration of the following conditions:  Respiratory failure and circulatory failure   Critical care was time spent personally by me on the following activities:  Blood draw for specimens, evaluation of patient's response to treatment, examination of patient, obtaining history from patient or surrogate, re-evaluation of patient's condition, review of old charts, pulse oximetry and ordering and performing treatments and interventions Procedure Name: Intubation Date/Time: 2023-06-14 11:17 PM  Performed by: Ruthe Cornet, DOPre-anesthesia Checklist: Patient identified, Timeout performed, Emergency Drugs available, Patient being monitored and Suction available Oxygen Delivery Method: Ambu bag Ventilation: Mask ventilation without difficulty Laryngoscope Size: Glidescope and 4 Grade View: Grade I Tube size: 7.5 mm Number of attempts: 1 Airway Equipment and Method: Video-laryngoscopy Placement Confirmation: ETT inserted through vocal cords under direct vision and Positive ETCO2 Secured at: 24 cm Tube secured with: ETT holder Dental Injury: Teeth and Oropharynx as per pre-operative assessment  Difficulty Due To: Difficulty was unanticipated    CODE BLUE occurred in the hospital.  I went up from the emergency department to assess.  Patient had been admitted for GI bleed.  Have been stable and on the floor.  Hemoglobin has stabilized.  She had cardiac arrest witnessed by nurse.  CPR was initiated.  I was able to intubate her with glide scope without any difficulties.  She had CPR for about 15 to 20 minutes with several rounds of epinephrine.  She had PEA on the monitor.  We got return of spontaneous circulation for about 5 minutes.  She had sinus bradycardia on EKG  and on monitor.  We started her on Levophed  but patient lost pulses again.  We did CPR for about 15 to 20 minutes more and were unable to get return of spontaneous circulation.  Bedside echocardiogram showed no cardiac activity.  Overall she was given several rounds of epinephrine calcium  bicarb dextrose  but we were unable to achieve stability.  She was pronounced deceased by hospitalist team.  Next of kin to be notified.  This chart was dictated using voice recognition software.  Despite best efforts to proofread,  errors can occur which can change the documentation meaning.    Ruthe Cornet, DO 2023-06-14 2319

## 2023-06-19 NOTE — Progress Notes (Signed)
  Progress Note   Patient: Chloe Harding FMW:[id number] DOB: [birth year] DOA: 05/21/2023     3 DOS: the patient was seen and examined on 06/14/2023   Brief hospital course: 78 year old woman PMH including esophagitis, esophageal stenosis admitted with acute on chronic anemia and concern for GI bleed.  Consultants GI Dr Rollin  Procedures/Events    Assessment and Plan: Symptomatic acute on chronic blood loss anemia Suspected upper GI bleeding LA grade D esophagitis and strictures. Seen by gastroenterology, no plan for repeat EGD at this time.  Continue PPI.  Advanced to regular diet.  No further bleeding.  Hemoglobin remained stable.   AKI Creatinine 1.37 on admission compared to baseline ~0.8.  Trending up for unclear reasons.  BUN trending up to.  Bladder scan negative.  Concern for prerenal.  No recent dye load.  Not on any likely nephro toxins.  Bolused 2 L of fluids.  Continue maintenance fluids.  Strict I's/O.  Check renal ultrasound, urine studies.  If not improved tomorrow, will involve nephrology.   Chronic diastolic CHF: Appeared hypovolemic on admission.  TTE in June 2023 showed EF 60-65%, G2DD. Monitor volume status.  IV fluids as above.  Diuretics on hold.   Hypertension: Hypertensive.  Coreg  increased.  Monitor.      Subjective:  Feels ok  Physical Exam: Vitals:   June 14, 2023 0330 06/14/23 0337 06/14/23 0700 Jun 14, 2023 0800  BP:  131/80    Pulse:  100    Resp:  19 (!) 29 (!) 30  Temp:  97.7 F (36.5 C)    TempSrc:  Axillary    SpO2:  97%    Weight: 64.1 kg     Height:       Physical Exam Vitals reviewed.  Constitutional:      General: She is not in acute distress.    Appearance: She is not ill-appearing or toxic-appearing.  Cardiovascular:     Rate and Rhythm: Normal rate and regular rhythm.     Heart sounds: No murmur heard. Pulmonary:     Effort: Pulmonary effort is normal. No respiratory distress.     Breath sounds: No wheezing, rhonchi or rales.   Neurological:     Mental Status: She is alert.  Psychiatric:        Mood and Affect: Mood normal.        Behavior: Behavior normal.     Data Reviewed: Creatinine up to 1.90.  CO2 down to 15.  BUN up to 27. Hemoglobin stable 10.0.  Family Communication:   Disposition: Status is: Inpatient Remains inpatient appropriate because: AKI     Time spent: 25 minutes  Author: Toribio Door, MD 06-14-2023 11:40 AM  For on call review [email or web address].

## 2023-06-19 NOTE — Progress Notes (Signed)
       Overnight   NAME: Chloe Harding MRN: [id number] DOB : 1946/02/09    Date of Service   2023-06-15   HPI/Events of Note    Notified by RN and in presence of RR RN when call notification was received for possible seizure like activity. Immediate departure to the floor and while en-route a CODE was called overhead .  On arrival floor Nursing staff had started CPR with report that patient had expressed a possible need to urinate/BM. Immediately after it is reported that patient lost pulse and respirations at which time CPR was started and CODE was called.  At about the same time of arrival to patient ER physician arrived and coordinated efforts. Patient was successfully intubated by ER physician. Patient was on Levophed . ACLS/code was conducted for 1-hour with ultrasound performed showing cardiac standstill. ER physician was present [facility] night attending was present  TOD 2259  Please see code sheet for specific medications doses and times.    Interventions/ Plan   TOD 2259      Lynwood Kipper BSN MSNA MSN ACNPC-AG Acute Care Nurse Practitioner [facility]

## 2023-06-19 DEATH — deceased

## 2023-07-17 NOTE — Death Summary Note (Signed)
   DEATH SUMMARY   Patient Details  Name: Chloe Harding MRN: [id number] DOB: Sep 29, 1945 [id number], Fleet Contras, NP Admission/Discharge Information   Admit Date:  05/22/2023  Date of Death: Date of Death: 05-25-23  Time of Death: Time of Death: 2257-07-19  Length of Stay: 3   Principle Cause of death: Seizure  Hospital Diagnoses: Principal Problem:   Acute on chronic blood loss anemia Active Problems:   Essential hypertension   AKI (acute kidney injury) (HCC)   Chronic diastolic CHF (congestive heart failure) (HCC) Seizure  GIB  Hospital Course: 78 year old woman PMH including esophagitis, esophageal stenosis admitted with acute on chronic anemia and concern for GI bleed.  Consultants GI Dr Elnoria Howard  Patient was seen by gastroenterology for symptomatic acute on chronic blood loss anemia and suspected upper GI bleeding in the context of known esophagitis.  No repeat EGD was planned and the patient was continued on a PPI.  Hemoglobin remained stable.    She was also treated for acute kidney injury of unclear etiology, though the dominant concern was for prerenal.  Bladder scan was negative, she was bolused with fluids without improvement.  Renal ultrasound was unremarkable.  On the night of her death, she was noted to have seizure-like and then lost her pulse.  Code was called.  Despite aggressive resuscitation efforts the patient was unable to be revived and she subsequently died at July 19, 2257.   Time spent: 20 minutes  Signed: Brendia Sacks, MD 07/14/23
